# Patient Record
Sex: Male | Born: 1965 | Race: White | Hispanic: No | Marital: Single | Smoking: Current every day smoker
Health system: Southern US, Community
[De-identification: ages and names within clinical notes are randomized; demographics above are authoritative.]

## PROBLEM LIST (undated history)

## (undated) DIAGNOSIS — C61 Malignant neoplasm of prostate: Secondary | ICD-10-CM

## (undated) DIAGNOSIS — C7951 Secondary malignant neoplasm of bone: Secondary | ICD-10-CM

## (undated) DIAGNOSIS — F32A Depression, unspecified: Secondary | ICD-10-CM

## (undated) DIAGNOSIS — F1010 Alcohol abuse, uncomplicated: Secondary | ICD-10-CM

## (undated) DIAGNOSIS — B182 Chronic viral hepatitis C: Secondary | ICD-10-CM

## (undated) DIAGNOSIS — M109 Gout, unspecified: Secondary | ICD-10-CM

## (undated) DIAGNOSIS — F329 Major depressive disorder, single episode, unspecified: Secondary | ICD-10-CM

## (undated) DIAGNOSIS — F1410 Cocaine abuse, uncomplicated: Secondary | ICD-10-CM

## (undated) DIAGNOSIS — Z923 Personal history of irradiation: Secondary | ICD-10-CM

## (undated) HISTORY — DX: Major depressive disorder, single episode, unspecified: F32.9

## (undated) HISTORY — DX: Chronic viral hepatitis C: B18.2

## (undated) HISTORY — DX: Depression, unspecified: F32.A

## (undated) HISTORY — DX: Personal history of irradiation: Z92.3

## (undated) NOTE — *Deleted (*Deleted)
Unable to discharge, unable to get into residents and patient does not want to discharge this late.

---

## 1998-02-25 ENCOUNTER — Emergency Department (HOSPITAL_COMMUNITY): Admission: EM | Admit: 1998-02-25 | Discharge: 1998-02-25 | Payer: Self-pay | Admitting: Emergency Medicine

## 1998-03-12 ENCOUNTER — Emergency Department (HOSPITAL_COMMUNITY): Admission: EM | Admit: 1998-03-12 | Discharge: 1998-03-12 | Payer: Self-pay | Admitting: Emergency Medicine

## 1999-11-09 ENCOUNTER — Emergency Department (HOSPITAL_COMMUNITY): Admission: EM | Admit: 1999-11-09 | Discharge: 1999-11-09 | Payer: Self-pay | Admitting: Emergency Medicine

## 1999-11-10 ENCOUNTER — Encounter: Payer: Self-pay | Admitting: Emergency Medicine

## 2000-04-09 ENCOUNTER — Emergency Department (HOSPITAL_COMMUNITY): Admission: EM | Admit: 2000-04-09 | Discharge: 2000-04-09 | Payer: Self-pay | Admitting: Emergency Medicine

## 2000-04-09 ENCOUNTER — Encounter: Payer: Self-pay | Admitting: Emergency Medicine

## 2003-03-27 ENCOUNTER — Emergency Department (HOSPITAL_COMMUNITY): Admission: EM | Admit: 2003-03-27 | Discharge: 2003-03-27 | Payer: Self-pay | Admitting: Emergency Medicine

## 2003-03-30 ENCOUNTER — Emergency Department (HOSPITAL_COMMUNITY): Admission: AD | Admit: 2003-03-30 | Discharge: 2003-03-30 | Payer: Self-pay | Admitting: Family Medicine

## 2012-01-09 ENCOUNTER — Emergency Department (HOSPITAL_COMMUNITY)
Admission: EM | Admit: 2012-01-09 | Discharge: 2012-01-11 | Disposition: A | Discharge status: Home / Self Care | Payer: Self-pay | Attending: Emergency Medicine | Admitting: Emergency Medicine

## 2012-01-09 ENCOUNTER — Encounter (HOSPITAL_COMMUNITY): Payer: Self-pay | Admitting: *Deleted

## 2012-01-09 DIAGNOSIS — F1010 Alcohol abuse, uncomplicated: Secondary | ICD-10-CM

## 2012-01-09 DIAGNOSIS — F1910 Other psychoactive substance abuse, uncomplicated: Secondary | ICD-10-CM

## 2012-01-09 DIAGNOSIS — F1410 Cocaine abuse, uncomplicated: Secondary | ICD-10-CM | POA: Insufficient documentation

## 2012-01-09 HISTORY — DX: Cocaine abuse, uncomplicated: F14.10

## 2012-01-09 HISTORY — DX: Alcohol abuse, uncomplicated: F10.10

## 2012-01-09 LAB — RAPID URINE DRUG SCREEN, HOSP PERFORMED
Amphetamines: NOT DETECTED
Barbiturates: POSITIVE — AB
Benzodiazepines: NOT DETECTED
Cocaine: POSITIVE — AB
Opiates: NOT DETECTED
Tetrahydrocannabinol: NOT DETECTED

## 2012-01-09 LAB — COMPREHENSIVE METABOLIC PANEL
ALT: 71 U/L — ABNORMAL HIGH (ref 0–53)
AST: 73 U/L — ABNORMAL HIGH (ref 0–37)
Albumin: 4.1 g/dL (ref 3.5–5.2)
Alkaline Phosphatase: 60 U/L (ref 39–117)
BUN: 9 mg/dL (ref 6–23)
CO2: 18 mEq/L — ABNORMAL LOW (ref 19–32)
Calcium: 9.3 mg/dL (ref 8.4–10.5)
Chloride: 99 mEq/L (ref 96–112)
Creatinine, Ser: 0.75 mg/dL (ref 0.50–1.35)
GFR calc Af Amer: >90 mL/min (ref >90)
GFR calc non Af Amer: >90 mL/min (ref >90)
Glucose, Bld: 78 mg/dL (ref 70–99)
Potassium: 3.6 mEq/L (ref 3.5–5.1)
Sodium: 135 mEq/L (ref 135–145)
Total Bilirubin: 1.1 mg/dL (ref 0.3–1.2)
Total Protein: 7.8 g/dL (ref 6.0–8.3)

## 2012-01-09 LAB — CBC
HCT: 45.1 % (ref 39.0–52.0)
Hemoglobin: 16.8 g/dL (ref 13.0–17.0)
MCH: 32.7 pg (ref 26.0–34.0)
MCHC: 36.9 g/dL — ABNORMAL HIGH (ref 30.0–36.0)
MCV: 87.9 fL (ref 78.0–100.0)
Platelets: 182 10*3/uL (ref 150–400)
RBC: 5.13 MIL/uL (ref 4.22–5.81)
RDW: 12.9 % (ref 11.5–15.5)
WBC: 9.1 10*3/uL (ref 4.0–10.5)

## 2012-01-09 LAB — ETHANOL: Alcohol, Ethyl (B): 34 mg/dL — ABNORMAL HIGH (ref 0–11)

## 2012-01-09 MED ORDER — ACETAMINOPHEN 325 MG PO TABS
650.0000 mg | ORAL_TABLET | ORAL | Status: DC | PRN
  Administered 2012-01-09: 650 mg via ORAL
  Filled 2012-01-09: qty 2

## 2012-01-09 MED ORDER — THIAMINE HCL 100 MG/ML IJ SOLN: 100.0000 mg | Freq: Every day | INTRAMUSCULAR | Status: DC

## 2012-01-09 MED ORDER — ADULT MULTIVITAMIN W/MINERALS CH
1.0000 | ORAL_TABLET | Freq: Every day | ORAL | Status: DC
  Administered 2012-01-09 – 2012-01-11 (×3): 1 via ORAL
  Filled 2012-01-09 (×3): qty 1

## 2012-01-09 MED ORDER — ZOLPIDEM TARTRATE 5 MG PO TABS: 5.0000 mg | ORAL_TABLET | Freq: Every evening | ORAL | Status: DC | PRN

## 2012-01-09 MED ORDER — FOLIC ACID 1 MG PO TABS
1.0000 mg | ORAL_TABLET | Freq: Every day | ORAL | Status: DC
  Administered 2012-01-09 – 2012-01-11 (×3): 1 mg via ORAL
  Filled 2012-01-09 (×3): qty 1

## 2012-01-09 MED ORDER — VITAMIN B-1 100 MG PO TABS
100.0000 mg | ORAL_TABLET | Freq: Every day | ORAL | Status: DC
  Administered 2012-01-09 – 2012-01-11 (×3): 100 mg via ORAL
  Filled 2012-01-09 (×3): qty 1

## 2012-01-09 MED ORDER — LORAZEPAM 2 MG/ML IJ SOLN: 1.0000 mg | Freq: Four times a day (QID) | INTRAMUSCULAR | Status: DC | PRN

## 2012-01-09 MED ORDER — ALUM & MAG HYDROXIDE-SIMETH 200-200-20 MG/5ML PO SUSP: 30.0000 mL | ORAL | Status: DC | PRN

## 2012-01-09 MED ORDER — ONDANSETRON HCL 4 MG PO TABS: 4.0000 mg | ORAL_TABLET | Freq: Three times a day (TID) | ORAL | Status: DC | PRN

## 2012-01-09 MED ORDER — LORAZEPAM 1 MG PO TABS
1.0000 mg | ORAL_TABLET | Freq: Four times a day (QID) | ORAL | Status: DC | PRN
  Administered 2012-01-09: 1 mg via ORAL
  Filled 2012-01-09: qty 1

## 2012-01-09 MED ORDER — NICOTINE 21 MG/24HR TD PT24
21.0000 mg | MEDICATED_PATCH | Freq: Every day | TRANSDERMAL | Status: DC
  Administered 2012-01-09 – 2012-01-11 (×3): 21 mg via TRANSDERMAL
  Filled 2012-01-09 (×3): qty 1

## 2012-01-09 MED ORDER — IBUPROFEN 600 MG PO TABS
600.0000 mg | ORAL_TABLET | Freq: Three times a day (TID) | ORAL | Status: DC | PRN
  Administered 2012-01-09 – 2012-01-10 (×2): 600 mg via ORAL
  Filled 2012-01-09 (×2): qty 3

## 2012-01-09 NOTE — BH Assessment (Signed)
Assessment Note   Edgar Mooney is a 46 y.o. male who presents to [facility] voluntarily requesting detox from alcohol. Pt reports drinking an 18 pack of beer daily for the past 3 months. He also reports smoking various amounts of crack cocaine daily for the past 3 weeks. He reports a history of alcohol abuse. Pt states he was clean and sober from 2008 until 4 months ago when he was laid off from his employment. He states he then lost home due to not being able to afford payments. He states he stopped going to [employer] and started drinking. He reports seeking treatment at this time because he is "tired of doing what I've been doing" and "Its as good a time to stop as any." He states he is motivated for change and is planning on seeking long term treatment at [facility] after completing detox. He denies a history of DTs and Seizures. Current withdrawal symptoms include anxiety, feeling shaky, and feeling restless.   He denies SI, HI, and AHVH. He can contract for safety.     Axis I: Alcohol Dependence and Cocaine Abuse  Axis II: Deferred Axis III:  Past Medical History  Diagnosis Date  . Alcohol abuse   . Cocaine abuse    Axis IV: economic problems, housing problems, occupational problems and problems related to social environment Axis V: 51-60 moderate symptoms  Past Medical History:  Past Medical History  Diagnosis Date  . Alcohol abuse   . Cocaine abuse     History reviewed. No pertinent past surgical history.  Family History: History reviewed. No pertinent family history.  Social History:  reports that he has been smoking Cigarettes.  He does not have any smokeless tobacco history on file. He reports that he drinks alcohol. He reports that he uses illicit drugs (Cocaine and Marijuana).  Additional Social History:  Alcohol / Drug Use History of alcohol / drug use?: Yes Substance #1 Name of Substance 1: Alcohol 1 - Age of First Use: 17 1 - Amount (size/oz): 18 pack  1 - Frequency:  daily 1 - Duration: heavily on and off for years, daily for 3 months 1 - Last Use / Amount: 01/09/12 Substance #2 Name of Substance 2: crack cocaine 2 - Amount (size/oz): varries 2 - Frequency: daily 2 - Duration: 3 weeks 2 - Last Use / Amount: 01/09/12  CIWA: CIWA-Ar BP: 128/72 mmHg Pulse Rate: 77  Nausea and Vomiting: no nausea and no vomiting Tactile Disturbances: none Tremor: not visible, but can be felt fingertip to fingertip Auditory Disturbances: not present Paroxysmal Sweats: no sweat visible Visual Disturbances: not present Anxiety: two Headache, Fullness in Head: mild Agitation: normal activity Orientation and Clouding of Sensorium: oriented and can do serial additions CIWA-Ar Total: 5  COWS:    Allergies: No Known Allergies  Home Medications:  (Not in a hospital admission)  OB/GYN Status:  No LMP for male patient.  General Assessment Data Location of Assessment: [facility] Living Arrangements: Other (Comment) (homeless) Can pt return to current living arrangement?: Yes Admission Status: Voluntary Is patient capable of signing voluntary admission?: Yes Transfer from: Acute Hospital Referral Source: Self/Family/Friend  Education Status Is patient currently in school?: No  Risk to self Suicidal Ideation: No Suicidal Intent: No Is patient at risk for suicide?: No Suicidal Plan?: No Access to Means: No What has been your use of drugs/alcohol within the last 12 months?: alcohol and crack cocaine Previous Attempts/Gestures: No How many times?: 0  Other Self Harm  Risks: none Triggers for Past Attempts: None known Intentional Self Injurious Behavior: None Family Suicide History: No Recent stressful life event(s): Job Loss;Other (Comment) (lost his home) Persecutory voices/beliefs?: No Depression: Yes Depression Symptoms: Despondent;Loss of interest in usual pleasures;Feeling worthless/self pity Substance abuse history and/or treatment for substance abuse?:  No Suicide prevention information given to non-admitted patients: Not applicable  Risk to Others Homicidal Ideation: No Thoughts of Harm to Others: No Current Homicidal Intent: No Current Homicidal Plan: No Access to Homicidal Means: No Identified Victim: none History of harm to others?: No Assessment of Violence: None Noted Violent Behavior Description: cooperative Does patient have access to weapons?: No Criminal Charges Pending?: Yes Describe Pending Criminal Charges: DWI Does patient have a court date: Yes Court Date: 01/25/12  Psychosis Hallucinations: None noted Delusions: None noted  Mental Status Report Appear/Hygiene: Disheveled Eye Contact: Fair Motor Activity: Unremarkable Speech: Logical/coherent Level of Consciousness: Quiet/awake Mood: Depressed Affect: Depressed Anxiety Level: None Thought Processes: Coherent;Relevant Judgement: Impaired Orientation: Person;Place;Time;Situation Obsessive Compulsive Thoughts/Behaviors: None  Cognitive Functioning Concentration: Normal Memory: Recent Intact;Remote Intact IQ: Average Insight: Fair Impulse Control: Fair Appetite: Fair Weight Loss: 0  Weight Gain: 0  Sleep: No Change Vegetative Symptoms: None  ADLScreening [facility] Assessment Services) Patient's cognitive ability adequate to safely complete daily activities?: Yes Patient able to express need for assistance with ADLs?: Yes Independently performs ADLs?: Yes (appropriate for developmental age)  Abuse/Neglect [facility]) Physical Abuse: Denies Verbal Abuse: Denies Sexual Abuse: Denies  Prior Inpatient Therapy Prior Inpatient Therapy: Yes Prior Therapy Dates: 2008 Prior Therapy Facilty/Provider(s): [facility] Reason for Treatment: detox  Prior Outpatient Therapy Prior Outpatient Therapy: Yes Prior Therapy Dates: 2012 Prior Therapy Facilty/Provider(s): [facility] Reason for Treatment: SA  ADL Screening (condition at time of admission) Patient's  cognitive ability adequate to safely complete daily activities?: Yes Patient able to express need for assistance with ADLs?: Yes Independently performs ADLs?: Yes (appropriate for developmental age) Weakness of Legs: None Weakness of Arms/Hands: None  Home Assistive Devices/Equipment Home Assistive Devices/Equipment: None    Abuse/Neglect Assessment (Assessment to be complete while patient is alone) Physical Abuse: Denies Verbal Abuse: Denies Sexual Abuse: Denies Exploitation of patient/patient's resources: Denies Self-Neglect: Denies Values / Beliefs Cultural Requests During Hospitalization: None Spiritual Requests During Hospitalization: None   Advance Directives (For Healthcare) Advance Directive: Patient does not have advance directive;Patient would not like information Pre-existing out of facility DNR order (yellow form or pink MOST form): No Nutrition Screen- [facility] Adult/[facility] Patient's home diet: Regular Have you recently lost weight without trying?: No Have you been eating poorly because of a decreased appetite?: No Malnutrition Screening Tool Score: 0   Additional Information 1:1 In Past 12 Months?: No CIRT Risk: No Elopement Risk: No Does patient have medical clearance?: Yes     Disposition:  Disposition Disposition of Patient: Referred to;Inpatient treatment program Type of inpatient treatment program: Adult Patient referred to: [facility]  On Site Evaluation by:   Reviewed with Physician:     Georgina Quint A 01/09/2012 11:45 PM

## 2012-01-09 NOTE — ED Notes (Signed)
Pt changed into blue scrubs and wanded by security 

## 2012-01-09 NOTE — ED Provider Notes (Signed)
Medical screening examination/treatment/procedure(s) were performed by non-physician practitioner and as supervising physician I was immediately available for consultation/collaboration.   Celene Kras, MD 01/09/12 [phone]

## 2012-01-09 NOTE — ED Notes (Addendum)
Pt requesting detox from etoh and cocaine. Reports last use was 2 hrs ago. Reports usually drinks "all I can get my hands on" pt denies SI/HI.

## 2012-01-09 NOTE — ED Notes (Signed)
Pt has one belonging bag and is locked in locker #26 in TCU

## 2012-01-09 NOTE — ED Notes (Signed)
Pt reports anxiety and feeling "shaky on the inside", reports h/a. Tylenol and Ativan given. Will monitor. Pt calm and cooperative at present.

## 2012-01-09 NOTE — ED Provider Notes (Signed)
History     CSN: [id number]  Arrival date & time 01/09/12  1320   First MD Initiated Contact with Patient 01/09/12 1416      Chief Complaint  Patient presents with  . Medical Clearance    (Consider location/radiation/quality/duration/timing/severity/associated sxs/prior treatment) HPI  46 year old male with hx of alcohol abuse and cocaine abuse present requesting for detox.  Pt reports he has been stressing out, losing his job, home, and can't carry a steady relationship.  He is living in a shelter.  Has been abusing alcohol and cocaine.  Last use today.  He wants help, he is requesting for detox  Denies SI/HI, or hallucination.  Denies any other sxs except generalized body aches and unable to sleep for the past 2 days.    Past Medical History  Diagnosis Date  . Alcohol abuse   . Cocaine abuse     History reviewed. No pertinent past surgical history.  History reviewed. No pertinent family history.  History  Substance Use Topics  . Smoking status: Current Every Day Smoker    Types: Cigarettes  . Smokeless tobacco: Not on file  . Alcohol Use: Yes      Review of Systems  Constitutional: Negative for fever.  HENT: Negative for neck pain.   Respiratory: Negative for chest tightness and shortness of breath.   Cardiovascular: Negative for chest pain.  Gastrointestinal: Negative for abdominal pain.  Neurological: Negative for headaches.  All other systems reviewed and are negative.    Allergies  Review of patient's allergies indicates no known allergies.  Home Medications   Current Outpatient Rx  Name Route Sig Dispense Refill  . ASPIRIN-SALICYLAMIDE-CAFFEINE 650-195-33.3 MG PO PACK Oral Take 1 Package by mouth daily as needed. headache      BP 109/69  Pulse 72  Temp 98.7 F (37.1 C) (Oral)  Resp 18  Ht 5\' 5"  (1.651 m)  Wt 154 lb (69.854 kg)  BMI 25.63 kg/m2  SpO2 100%  Physical Exam  Nursing note and vitals reviewed. Constitutional: He appears  well-developed and well-nourished. No distress.       Awake, alert, nontoxic appearance  HENT:  Head: Atraumatic.  Eyes: Conjunctivae normal are normal. Right eye exhibits no discharge. Left eye exhibits no discharge.  Neck: Normal range of motion. Neck supple.  Cardiovascular: Normal rate and regular rhythm.   Pulmonary/Chest: Effort normal. No respiratory distress. He exhibits no tenderness.  Abdominal: Soft. There is no tenderness. There is no rebound.  Musculoskeletal: He exhibits no edema and no tenderness.       ROM appears intact, no obvious focal weakness  Neurological: He is alert.  Skin: Skin is warm and dry. No rash noted.  Psychiatric: He has a normal mood and affect.    ED Course  Procedures (including critical care time)  Labs Reviewed  CBC - Abnormal; Notable for the following:    MCHC 36.9 (*)     All other components within normal limits  COMPREHENSIVE METABOLIC PANEL - Abnormal; Notable for the following:    CO2 18 (*)     AST 73 (*)     ALT 71 (*)     All other components within normal limits  ETHANOL - Abnormal; Notable for the following:    Alcohol, Ethyl (B) 34 (*)     All other components within normal limits  URINE RAPID DRUG SCREEN (HOSP PERFORMED) - Abnormal; Notable for the following:    Cocaine POSITIVE (*)     Barbiturates  POSITIVE (*)     All other components within normal limits   Results for orders placed during the hospital encounter of 01/09/12  CBC      Component Value Range   WBC 9.1  4.0 - 10.5 K/uL   RBC 5.13  4.22 - 5.81 MIL/uL   Hemoglobin 16.8  13.0 - 17.0 g/dL   HCT 96.0  45.4 - 09.8 %   MCV 87.9  78.0 - 100.0 fL   MCH 32.7  26.0 - 34.0 pg   MCHC 36.9 (*) 30.0 - 36.0 g/dL   RDW 11.9  14.7 - 82.9 %   Platelets 182  150 - 400 K/uL  COMPREHENSIVE METABOLIC PANEL      Component Value Range   Sodium 135  135 - 145 mEq/L   Potassium 3.6  3.5 - 5.1 mEq/L   Chloride 99  96 - 112 mEq/L   CO2 18 (*) 19 - 32 mEq/L   Glucose, Bld 78   70 - 99 mg/dL   BUN 9  6 - 23 mg/dL   Creatinine, Ser 5.62  0.50 - 1.35 mg/dL   Calcium 9.3  8.4 - 13.0 mg/dL   Total Protein 7.8  6.0 - 8.3 g/dL   Albumin 4.1  3.5 - 5.2 g/dL   AST 73 (*) 0 - 37 U/L   ALT 71 (*) 0 - 53 U/L   Alkaline Phosphatase 60  39 - 117 U/L   Total Bilirubin 1.1  0.3 - 1.2 mg/dL   GFR calc non Af Amer >90  >90 mL/min   GFR calc Af Amer >90  >90 mL/min  ETHANOL      Component Value Range   Alcohol, Ethyl (B) 34 (*) 0 - 11 mg/dL  URINE RAPID DRUG SCREEN (HOSP PERFORMED)      Component Value Range   Opiates NONE DETECTED  NONE DETECTED   Cocaine POSITIVE (*) NONE DETECTED   Benzodiazepines NONE DETECTED  NONE DETECTED   Amphetamines NONE DETECTED  NONE DETECTED   Tetrahydrocannabinol NONE DETECTED  NONE DETECTED   Barbiturates POSITIVE (*) NONE DETECTED   No results found.   1. Cocaine abuse 2. Alcohol abuse  MDM  Pt requesting for alcohol and cocaine detox.  Does not appears to be in withdrawal.  NO SI/HI.  Discussed care with my attending.    BP 109/69  Pulse 72  Temp 98.7 F (37.1 C) (Oral)  Resp 18  Ht 5\' 5"  (1.651 m)  Wt 154 lb (69.854 kg)  BMI 25.63 kg/m2  SpO2 100%  Nursing notes reviewed and considered in documentation  Previous records reviewed and considered  All labs/vitals reviewed and considered   3:20 PM Consulted [facility] who will continue further care.  Psych hold and Med Rec filled.          Fayrene Helper, PA-C 01/09/12 1521

## 2012-01-09 NOTE — Progress Notes (Signed)
Alma Downs, MD is pcp updated epic

## 2012-01-11 MED ORDER — LORAZEPAM 1 MG PO TABS: 1.0000 mg | ORAL_TABLET | Freq: Four times a day (QID) | ORAL | Status: DC | PRN

## 2012-01-11 NOTE — BHH Counselor (Signed)
Spoke with RN who informed of pt contacting [facility] via telephone. Informed they do have a male bed. But will need authorization from the [facility]. Contacted [facility] and spoke with Noreene Larsson who stated pt is not a resident of [address] and therefore would need to contact [place] for auth for this pt. TC to [employer] and spoke with Sherrine Maples who informed me they do not have a contract with [facility] and would not provide funding for this. TC with Misty Stanley @ [facility]. Let her know dilemma. Stated she would have to get [facility] involved, as pt informed her that he was a [employer] resident. Transferred to [facility], explained situation. They do not have [place] beds today, but do have [place] beds. Has been there before but listed as [facility].

## 2012-01-11 NOTE — ED Notes (Signed)
Dr campos into see 

## 2012-01-11 NOTE — BHH Counselor (Signed)
Spoke with pt informing him about the sponsorship denials and issues regarding his residency. Pt stated he has been staying at the [place] homeless shelter for a few weeks, but has a [place] address listed on his ID. Questioned pt as to why he gave a [place] address upon admission and pt stated "I don't know, maybe I was confused and gave my son's address. He lives here in [place]. We both have the same name, I'm Edgar Mooney and he is Edgar Simas III." Explained that the [facility] is not willing to sponsor him due to being in [facility] for a few weeks, as [facility] does not have bed opening for [employer]. Reviewed residential treatment list with pt and instructed him to follow up with these services. Updated RN.

## 2012-01-11 NOTE — ED Provider Notes (Signed)
Filed Vitals:   01/11/12 0427  BP: 111/74  Pulse: 52  Temp: 97.5 F (36.4 C)  Resp: 20   8:15 AM  No HI or SI. Home with outpatient resources. Home with PRN ativan. Agreeable to plan   Lyanne Co, MD 01/11/12 [phone]

## 2012-01-11 NOTE — ED Notes (Addendum)
1 bag of belongings returned to pt after leaving the area

## 2012-01-11 NOTE — ED Notes (Signed)
Pt contacted [facility] and Misty Stanley reported that they do have a male bed, but the pt requires authorization--[facility] is aware and is trying to arrange

## 2012-01-11 NOTE — ED Notes (Addendum)
[facility] unable to arrange for acceptance to [facility], pt aware, OP resources given and reviewed w/ pt.

## 2012-01-11 NOTE — BHH Counselor (Signed)
Pt agreeable with pursuing treatment on his own.  Pt provided with OPT and Tx program listings.

## 2012-01-11 NOTE — ED Notes (Signed)
Pt has been in contact w/  Friend at [facility] and they have advised him they may have beds today, and to call back in about 15 mins.

## 2012-01-11 NOTE — ED Notes (Addendum)
Dc instructions and referals  reviewed w/ pt,  pt verbalized understanding.  Pt up to the desk to use the phone.  Pt alert/oriented x3, pleasant.  Pt reports he is wants to get into long term treatment for alcohol treatment.  NAD.

## 2012-02-13 ENCOUNTER — Emergency Department (HOSPITAL_BASED_OUTPATIENT_CLINIC_OR_DEPARTMENT_OTHER)
Admission: EM | Admit: 2012-02-13 | Discharge: 2012-02-13 | Disposition: A | Discharge status: Home / Self Care | Payer: Self-pay | Attending: Emergency Medicine | Admitting: Emergency Medicine

## 2012-02-13 ENCOUNTER — Encounter (HOSPITAL_BASED_OUTPATIENT_CLINIC_OR_DEPARTMENT_OTHER): Payer: Self-pay | Admitting: Emergency Medicine

## 2012-02-13 DIAGNOSIS — Z7982 Long term (current) use of aspirin: Secondary | ICD-10-CM | POA: Insufficient documentation

## 2012-02-13 DIAGNOSIS — F17200 Nicotine dependence, unspecified, uncomplicated: Secondary | ICD-10-CM | POA: Insufficient documentation

## 2012-02-13 DIAGNOSIS — F1010 Alcohol abuse, uncomplicated: Secondary | ICD-10-CM | POA: Insufficient documentation

## 2012-02-13 DIAGNOSIS — M109 Gout, unspecified: Secondary | ICD-10-CM | POA: Insufficient documentation

## 2012-02-13 DIAGNOSIS — F1410 Cocaine abuse, uncomplicated: Secondary | ICD-10-CM | POA: Insufficient documentation

## 2012-02-13 MED ORDER — IBUPROFEN 800 MG PO TABS
800.0000 mg | ORAL_TABLET | Freq: Once | ORAL | Status: AC
  Administered 2012-02-13: 800 mg via ORAL
  Filled 2012-02-13: qty 1

## 2012-02-13 MED ORDER — COLCHICINE 0.6 MG PO TABS
1.2000 mg | ORAL_TABLET | Freq: Once | ORAL | Status: AC
  Administered 2012-02-13: 1.2 mg via ORAL
  Filled 2012-02-13: qty 2

## 2012-02-13 MED ORDER — COLCHICINE 0.6 MG PO TABS
0.6000 mg | ORAL_TABLET | Freq: Once | ORAL | Status: DC
  Filled 2012-02-13: qty 1

## 2012-02-13 MED ORDER — ALLOPURINOL 300 MG PO TABS: 300.0000 mg | ORAL_TABLET | Freq: Every day | ORAL | Status: DC

## 2012-02-13 MED ORDER — IBUPROFEN 800 MG PO TABS: 800.0000 mg | ORAL_TABLET | Freq: Three times a day (TID) | ORAL | Status: DC

## 2012-02-13 NOTE — ED Provider Notes (Signed)
History     CSN: [id number]  Arrival date & time 02/13/12  1317   First MD Initiated Contact with Patient 02/13/12 1330      Chief Complaint  Patient presents with  . Foot Pain    left    (Consider location/radiation/quality/duration/timing/severity/associated sxs/prior treatment) HPI Comments: Patient is a 46 year old male with a history of gout presents with 1 day history of left ankle pain. The patient denies injury to left ankle. He reports aching, severe pain. Patient reports constant pain since onset. He reports a history of gout and this joint location and pain is typical of his gout flares. Ankle movement and weight bearing activity make the pain worse. Nothing makes the pain better. Patient reports associated swelling and redness. Patient has not tried anything for pain relief. Patient denies obvious deformity, numbness/tingling, coolness/weakness of extremity, bruising, and any other injury.     Patient is a 46 y.o. male presenting with lower extremity pain.  Foot Pain Associated symptoms include arthralgias and joint swelling.    Past Medical History  Diagnosis Date  . Alcohol abuse   . Cocaine abuse     History reviewed. No pertinent past surgical history.  No family history on file.  History  Substance Use Topics  . Smoking status: Current Every Day Smoker -- 1.0 packs/day    Types: Cigarettes  . Smokeless tobacco: Not on file  . Alcohol Use: No      Review of Systems  Musculoskeletal: Positive for joint swelling and arthralgias.  All other systems reviewed and are negative.    Allergies  Review of patient's allergies indicates no known allergies.  Home Medications   Current Outpatient Rx  Name Route Sig Dispense Refill  . ASPIRIN-SALICYLAMIDE-CAFFEINE 650-195-33.3 MG PO PACK Oral Take 1 Package by mouth daily as needed. headache      BP 129/86  Pulse 85  Temp 97.9 F (36.6 C)  Resp 16  Ht 5\' 4"  (1.626 m)  Wt 170 lb (77.111 kg)  BMI  29.18 kg/m2  SpO2 99%  Physical Exam  Nursing note and vitals reviewed. Constitutional: He is oriented to person, place, and time. He appears well-developed and well-nourished. No distress.  HENT:  Head: Normocephalic and atraumatic.  Eyes: Conjunctivae normal and EOM are normal.  Neck: Normal range of motion. Neck supple.  Cardiovascular: Normal rate and regular rhythm.  Exam reveals no gallop and no friction rub.   No murmur heard. Pulmonary/Chest: Effort normal and breath sounds normal. He has no wheezes. He has no rales. He exhibits no tenderness.  Abdominal: Soft. He exhibits no distension.  Musculoskeletal: Normal range of motion.       Edema and erythema noted to left ankle. Left ankle ROM slightly limited due pain and swelling. Left ankle tender to palpation. Right ankle unaffected.   Neurological: He is alert and oriented to person, place, and time. Coordination normal.       Strength and sensation equal and intact bilaterally. Speech is goal-oriented. Moves limbs without ataxia.   Skin: Skin is warm and dry. He is not diaphoretic.  Psychiatric: He has a normal mood and affect. His behavior is normal.    ED Course  Procedures (including critical care time)  Labs Reviewed - No data to display No results found.   1. Gout flare       MDM  1:42 PM Patient reports this pain typical of his gout flares. He will have 1.8mg  colchicine here and go home with  800mg  ibuprofen and allopurinol.        Emilia Beck, PA-C 02/13/12 1538

## 2012-02-13 NOTE — ED Notes (Signed)
Pt c/o Lt foot pain x 1 day. Pt denies injury. Pt states "my gout is flaring up again".

## 2012-02-14 NOTE — ED Provider Notes (Signed)
Medical screening examination/treatment/procedure(s) were performed by non-physician practitioner and as supervising physician I was immediately available for consultation/collaboration.  Eirik Schueler, MD 02/14/12 1619 

## 2012-02-21 ENCOUNTER — Emergency Department (HOSPITAL_BASED_OUTPATIENT_CLINIC_OR_DEPARTMENT_OTHER)
Admission: EM | Admit: 2012-02-21 | Discharge: 2012-02-21 | Disposition: A | Discharge status: Home / Self Care | Payer: Self-pay | Attending: Emergency Medicine | Admitting: Emergency Medicine

## 2012-02-21 ENCOUNTER — Encounter (HOSPITAL_BASED_OUTPATIENT_CLINIC_OR_DEPARTMENT_OTHER): Payer: Self-pay | Admitting: *Deleted

## 2012-02-21 DIAGNOSIS — Z79899 Other long term (current) drug therapy: Secondary | ICD-10-CM | POA: Insufficient documentation

## 2012-02-21 DIAGNOSIS — F17200 Nicotine dependence, unspecified, uncomplicated: Secondary | ICD-10-CM | POA: Insufficient documentation

## 2012-02-21 DIAGNOSIS — F1410 Cocaine abuse, uncomplicated: Secondary | ICD-10-CM | POA: Insufficient documentation

## 2012-02-21 DIAGNOSIS — M10061 Idiopathic gout, right knee: Secondary | ICD-10-CM

## 2012-02-21 DIAGNOSIS — M109 Gout, unspecified: Secondary | ICD-10-CM | POA: Insufficient documentation

## 2012-02-21 DIAGNOSIS — IMO0001 Reserved for inherently not codable concepts without codable children: Secondary | ICD-10-CM | POA: Insufficient documentation

## 2012-02-21 HISTORY — DX: Gout, unspecified: M10.9

## 2012-02-21 MED ORDER — PREDNISONE 10 MG PO TABS: ORAL_TABLET | ORAL | Status: DC

## 2012-02-21 NOTE — ED Provider Notes (Signed)
History     CSN: [id number]  Arrival date & time 02/21/12  1054   First MD Initiated Contact with Patient 02/21/12 1204      Chief Complaint  Patient presents with  . Knee Pain    right     (Consider location/radiation/quality/duration/timing/severity/associated sxs/prior treatment) Patient is a 46 y.o. male presenting with knee pain. The history is provided by the patient. No language interpreter was used.  Knee Pain This is a new problem. The current episode started in the past 7 days. The problem occurs constantly. The problem has been gradually worsening. Associated symptoms include joint swelling and myalgias. The symptoms are aggravated by bending. He has tried nothing for the symptoms. The treatment provided moderate relief.  Pt complains of swelling in his right knee.  Pt reports he feels like he has gout.   Past Medical History  Diagnosis Date  . Alcohol abuse   . Cocaine abuse   . Gout     History reviewed. No pertinent past surgical history.  No family history on file.  History  Substance Use Topics  . Smoking status: Current Every Day Smoker -- 1.0 packs/day    Types: Cigarettes  . Smokeless tobacco: Not on file  . Alcohol Use: No     Comment: [facility] 01/24/12      Review of Systems  Musculoskeletal: Positive for myalgias and joint swelling.  All other systems reviewed and are negative.    Allergies  Review of patient's allergies indicates no known allergies.  Home Medications   Current Outpatient Rx  Name  Route  Sig  Dispense  Refill  . ALLOPURINOL 300 MG PO TABS   Oral   Take 1 tablet (300 mg total) by mouth daily.   30 tablet   3   . ASPIRIN-SALICYLAMIDE-CAFFEINE 650-195-33.3 MG PO PACK   Oral   Take 1 Package by mouth daily as needed. headache         . IBUPROFEN 800 MG PO TABS   Oral   Take 1 tablet (800 mg total) by mouth 3 (three) times daily.   21 tablet   0     BP 129/87  Pulse 67  Temp 98.4 F (36.9 C) (Oral)   Resp 20  SpO2 100%  Physical Exam  Vitals reviewed. Constitutional: He is oriented to person, place, and time. He appears well-developed and well-nourished.  HENT:  Head: Normocephalic.  Musculoskeletal: He exhibits tenderness.       From no instability, nv and ns intact  Neurological: He is alert and oriented to person, place, and time. He has normal reflexes.  Skin: Skin is warm.  Psychiatric: He has a normal mood and affect.    ED Course  Procedures (including critical care time)  Labs Reviewed - No data to display No results found.   No diagnosis found.    MDM  Prednisone taper        Elson Areas, [place] 02/21/12 1226

## 2012-02-21 NOTE — ED Provider Notes (Signed)
Medical screening examination/treatment/procedure(s) were performed by non-physician practitioner and as supervising physician I was immediately available for consultation/collaboration.   Dione Booze, MD 02/21/12 1259

## 2012-04-25 ENCOUNTER — Emergency Department (HOSPITAL_BASED_OUTPATIENT_CLINIC_OR_DEPARTMENT_OTHER): Payer: Self-pay

## 2012-04-25 ENCOUNTER — Emergency Department (HOSPITAL_BASED_OUTPATIENT_CLINIC_OR_DEPARTMENT_OTHER)
Admission: EM | Admit: 2012-04-25 | Discharge: 2012-04-25 | Disposition: A | Discharge status: Home / Self Care | Payer: Self-pay | Attending: Emergency Medicine | Admitting: Emergency Medicine

## 2012-04-25 ENCOUNTER — Encounter (HOSPITAL_BASED_OUTPATIENT_CLINIC_OR_DEPARTMENT_OTHER): Payer: Self-pay

## 2012-04-25 DIAGNOSIS — R059 Cough, unspecified: Secondary | ICD-10-CM | POA: Insufficient documentation

## 2012-04-25 DIAGNOSIS — F17200 Nicotine dependence, unspecified, uncomplicated: Secondary | ICD-10-CM | POA: Insufficient documentation

## 2012-04-25 DIAGNOSIS — R51 Headache: Secondary | ICD-10-CM | POA: Insufficient documentation

## 2012-04-25 DIAGNOSIS — J069 Acute upper respiratory infection, unspecified: Secondary | ICD-10-CM | POA: Insufficient documentation

## 2012-04-25 DIAGNOSIS — Z79899 Other long term (current) drug therapy: Secondary | ICD-10-CM | POA: Insufficient documentation

## 2012-04-25 DIAGNOSIS — R05 Cough: Secondary | ICD-10-CM | POA: Insufficient documentation

## 2012-04-25 DIAGNOSIS — M109 Gout, unspecified: Secondary | ICD-10-CM | POA: Insufficient documentation

## 2012-04-25 IMAGING — CR DG CHEST 2V
2 series · 2 of 2 positions shown · non-contrast
Comparison: None.

CLINICAL DATA: Nasal congestion, headache

CHEST - 2 VIEW

[w chest pa]
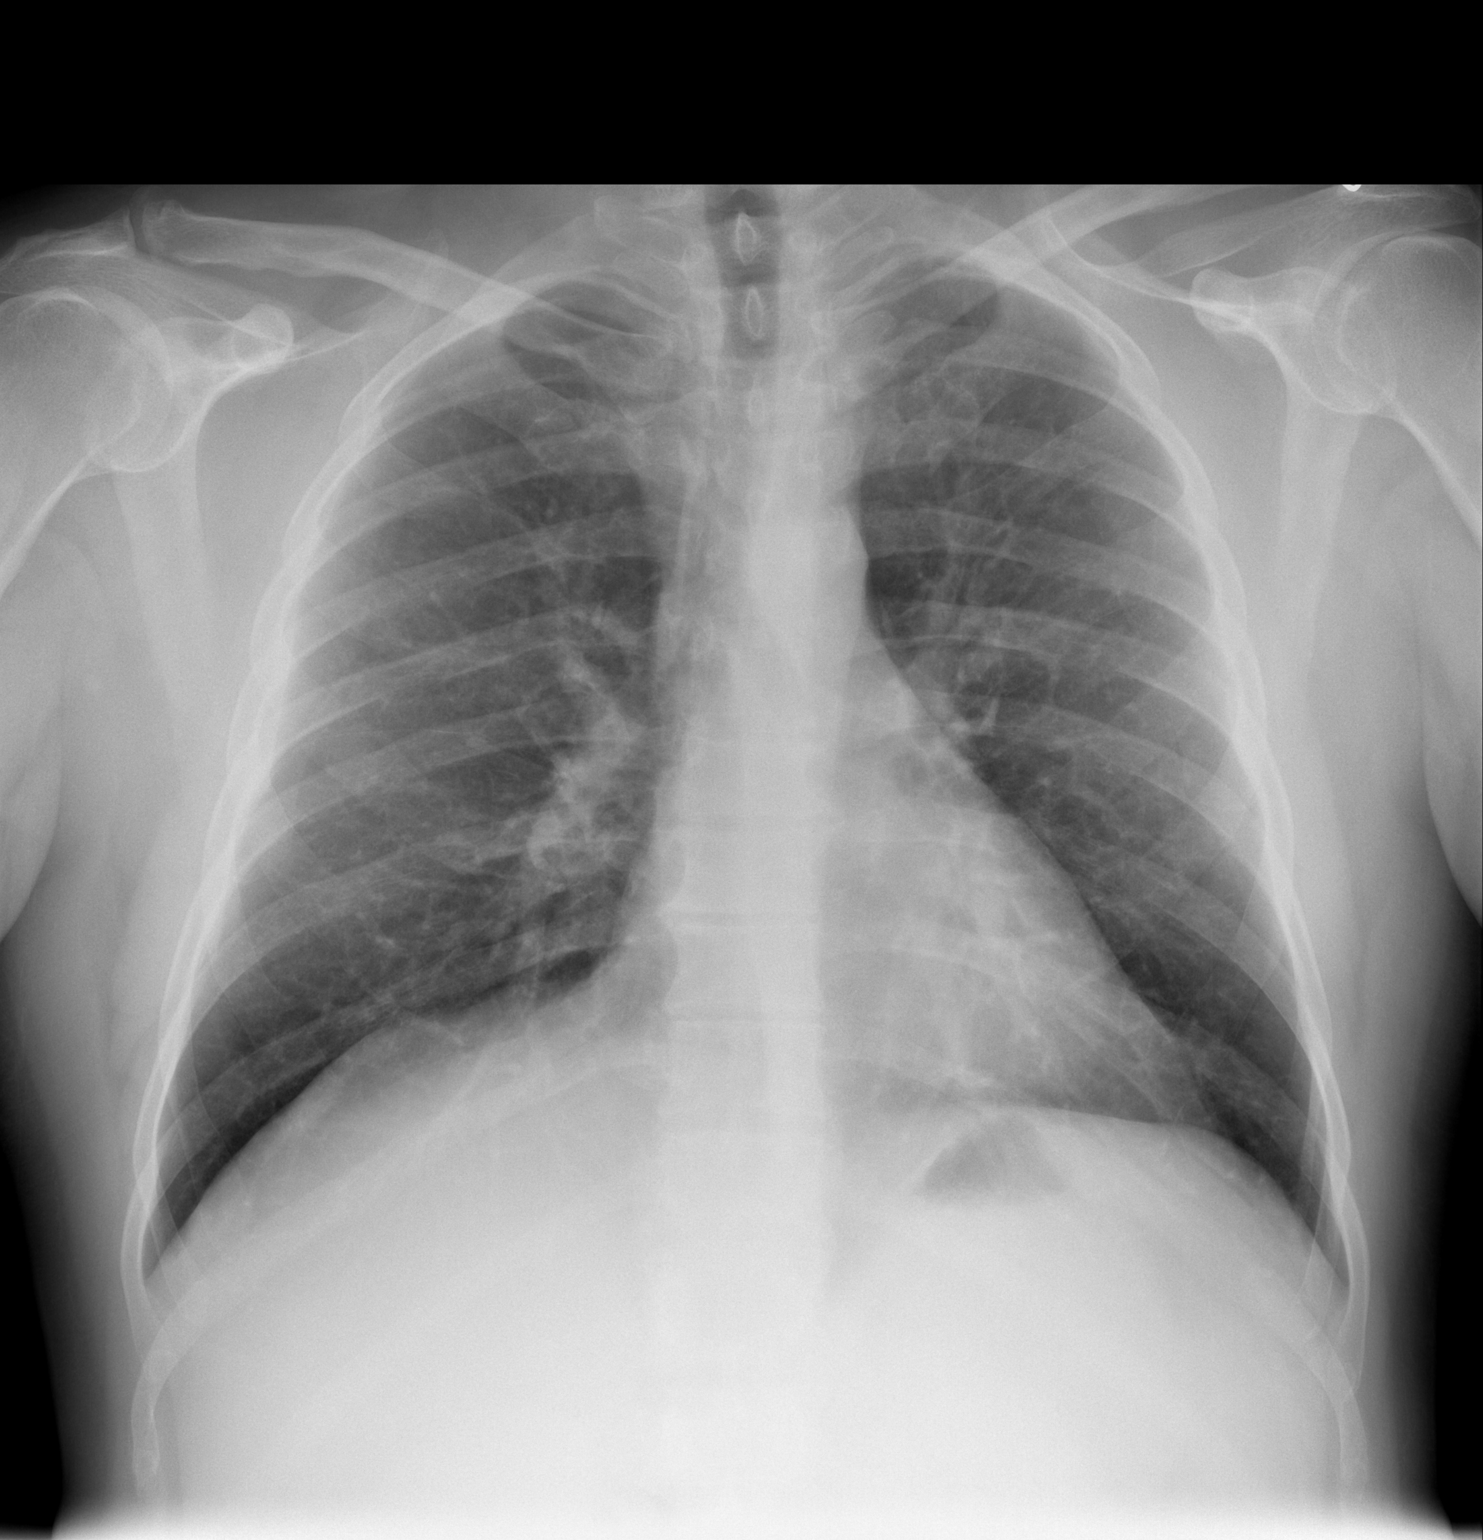

[w chest lat]
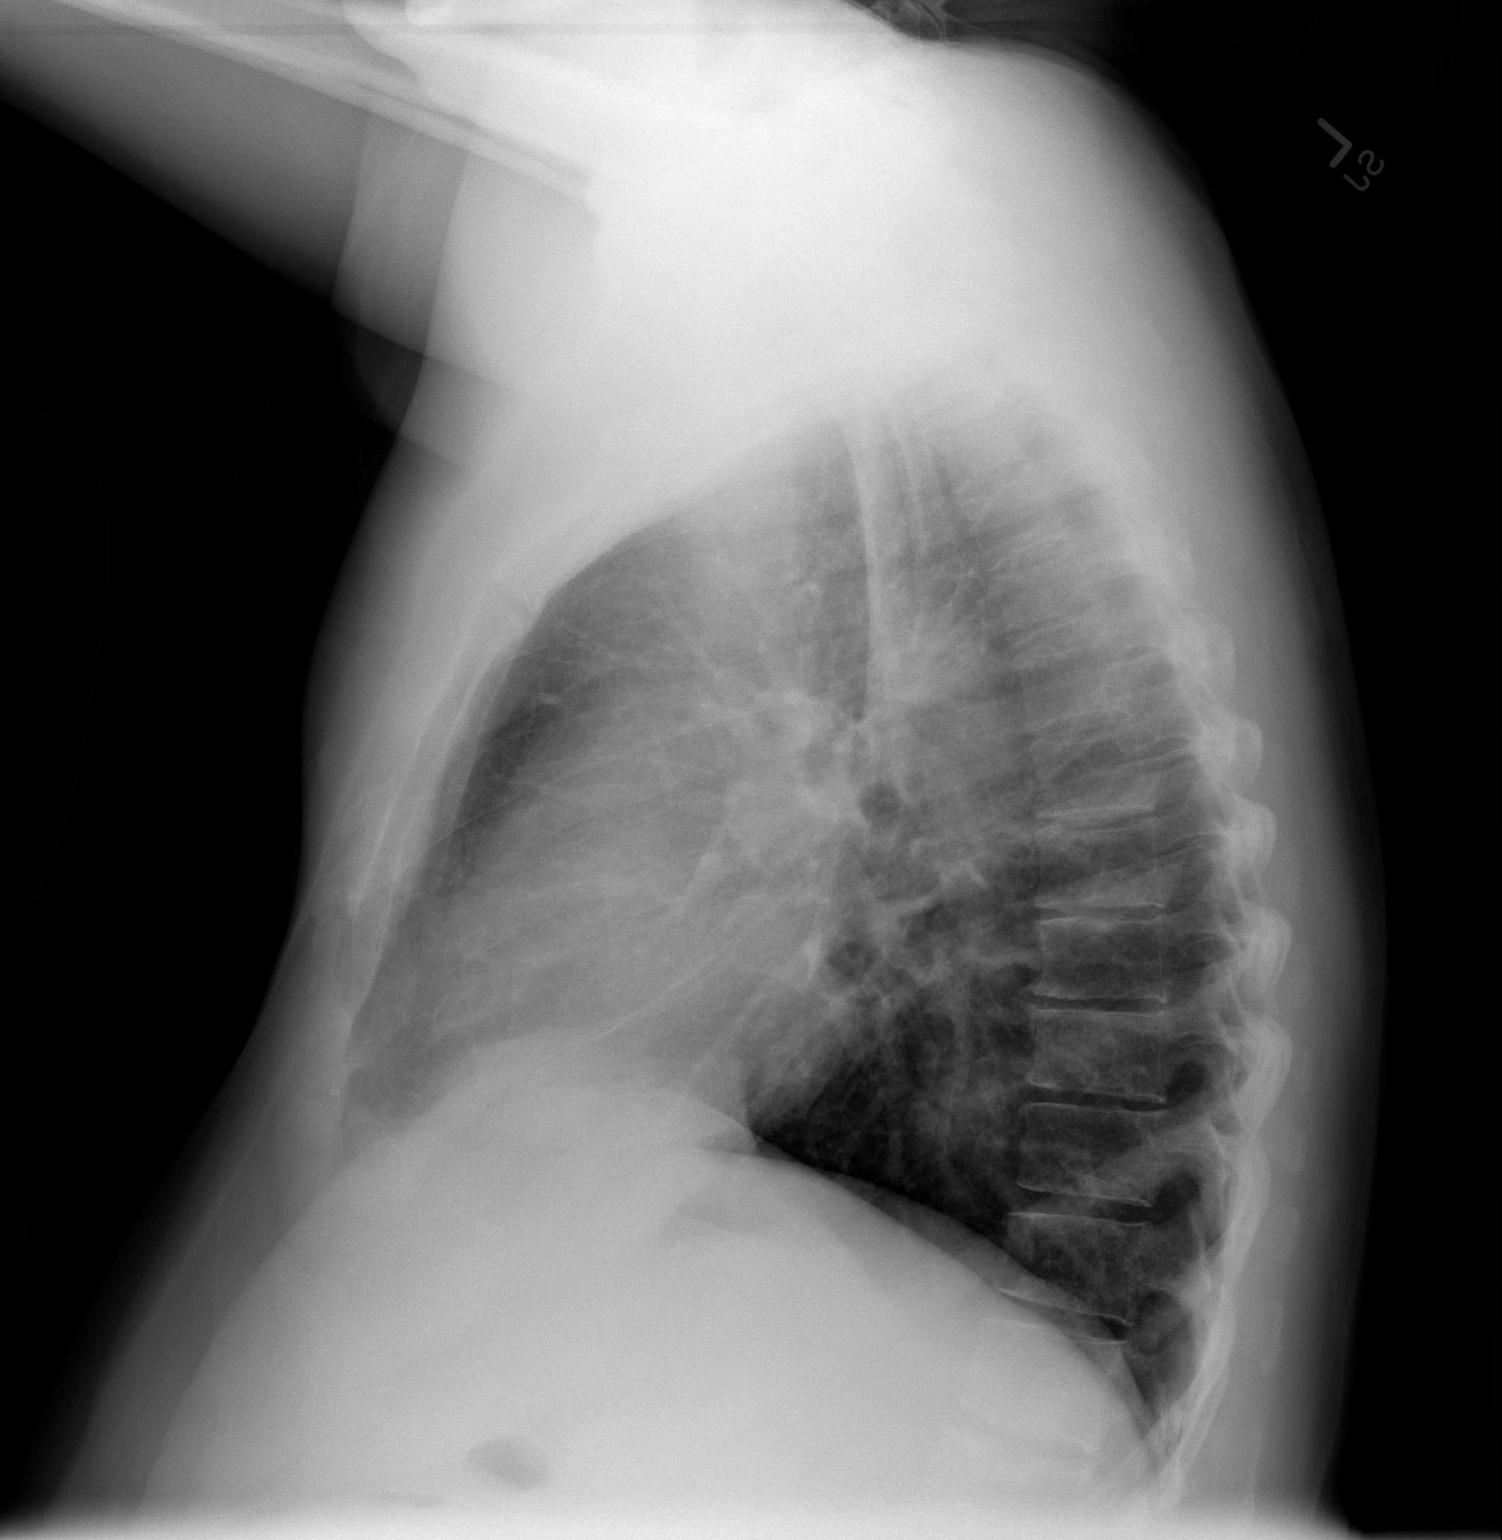

[2 of 2 positions shown; findings below may reference images not displayed]

FINDINGS: Cardiomediastinal silhouette is unremarkable.  No acute
infiltrate or pleural effusion.  No pulmonary edema. Mild perihilar
increased bronchial markings suspicious for mild bronchitic
changes.
IMPRESSION: No acute infiltrate or pulmonary edema.  Mild perihilar increased
bronchial markings suspicious for mild bronchitic changes.

## 2012-04-25 MED ORDER — OXYMETAZOLINE HCL 0.05 % NA SOLN
1.0000 | Freq: Three times a day (TID) | NASAL | Status: DC | PRN
  Administered 2012-04-25: 1 via NASAL
  Filled 2012-04-25: qty 15

## 2012-04-25 NOTE — ED Notes (Signed)
Pt states that he had onset of cough, congestion, headache yesterday.  [facility].  Pt has been sober for 3 months.  Cough is productive for yellow mucus, denies rhinorrhea, c/o congestion, and severe headache.

## 2012-04-25 NOTE — ED Provider Notes (Signed)
History     CSN: [id number]  Arrival date & time 04/25/12  1016   First MD Initiated Contact with Patient 04/25/12 1053      Chief Complaint  Patient presents with  . Nasal Congestion  . Cough  . Headache    (Consider location/radiation/quality/duration/timing/severity/associated sxs/prior treatment) Patient is a 47 y.o. male presenting with cough and headaches. The history is provided by the patient.  Cough This is a new problem. Episode onset: 3 days ago. The problem occurs constantly. The problem has been gradually worsening. The cough is productive of sputum. There has been no fever. Associated symptoms include ear congestion, headaches and rhinorrhea. Pertinent negatives include no myalgias, no shortness of breath and no wheezing. He has tried decongestants for the symptoms. The treatment provided no relief. He is a smoker. His past medical history does not include bronchitis, COPD or asthma.  Headache  Pertinent negatives include no fever and no shortness of breath.    Past Medical History  Diagnosis Date  . Alcohol abuse   . Cocaine abuse   . Gout     History reviewed. No pertinent past surgical history.  History reviewed. No pertinent family history.  History  Substance Use Topics  . Smoking status: Current Some Day Smoker    Types: Cigarettes  . Smokeless tobacco: Never Used  . Alcohol Use: No     Comment: Soberity 01/24/12      Review of Systems  Constitutional: Negative for fever.  HENT: Positive for rhinorrhea.   Respiratory: Positive for cough. Negative for shortness of breath and wheezing.   Musculoskeletal: Negative for myalgias.  Neurological: Positive for headaches.  All other systems reviewed and are negative.    Allergies  Review of patient's allergies indicates no known allergies.  Home Medications   Current Outpatient Rx  Name  Route  Sig  Dispense  Refill  . ALLOPURINOL 300 MG PO TABS   Oral   Take 1 tablet (300 mg total) by mouth  daily.   30 tablet   3   . BUPROPION HCL ER (SR) 150 MG PO TB12   Oral   Take 300 mg by mouth daily.         . IBUPROFEN 800 MG PO TABS   Oral   Take 1 tablet (800 mg total) by mouth 3 (three) times daily.   21 tablet   0   . ASPIRIN-SALICYLAMIDE-CAFFEINE 650-195-33.3 MG PO PACK   Oral   Take 1 Package by mouth daily as needed. headache         . PREDNISONE 10 MG PO TABS      6,5,4,3,2,1 taper   21 tablet   0     BP 121/73  Pulse 83  Temp 98.9 F (37.2 C) (Oral)  Resp 22  Ht 5\' 2"  (1.575 m)  Wt 180 lb (81.647 kg)  BMI 32.92 kg/m2  SpO2 98%  Physical Exam  Nursing note and vitals reviewed. Constitutional: He is oriented to person, place, and time. He appears well-developed and well-nourished. No distress.  HENT:  Head: Normocephalic and atraumatic.  Right Ear: Tympanic membrane normal.  Left Ear: Tympanic membrane normal.  Nose: Rhinorrhea present.  Mouth/Throat: No oropharyngeal exudate, posterior oropharyngeal edema or posterior oropharyngeal erythema.  Eyes: Conjunctivae normal and EOM are normal. Pupils are equal, round, and reactive to light.  Neck: Normal range of motion. Neck supple.  Cardiovascular: Normal rate, regular rhythm and intact distal pulses.   No murmur heard. Pulmonary/Chest: Effort  normal and breath sounds normal. No respiratory distress. He has no wheezes. He has no rales.  Abdominal: Soft. He exhibits no distension. There is no tenderness. There is no rebound and no guarding.  Musculoskeletal: Normal range of motion. He exhibits no edema and no tenderness.  Neurological: He is alert and oriented to person, place, and time.  Skin: Skin is warm and dry. No rash noted. No erythema.  Psychiatric: He has a normal mood and affect. His behavior is normal.    ED Course  Procedures (including critical care time)  Labs Reviewed - No data to display Dg Chest 2 View  04/25/2012  *RADIOLOGY REPORT*  Clinical Data: Nasal congestion, headache   CHEST - 2 VIEW  Comparison: None.  Findings: Cardiomediastinal silhouette is unremarkable.  No acute infiltrate or pleural effusion.  No pulmonary edema. Mild perihilar increased bronchial markings suspicious for mild bronchitic changes.  IMPRESSION: No acute infiltrate or pulmonary edema.  Mild perihilar increased bronchial markings suspicious for mild bronchitic changes.   Original Report Authenticated By: Natasha Mead, M.D.      1. URI (upper respiratory infection)       MDM   Pt with symptoms consistent with viral URI.  Well appearing here.  No signs of breathing difficulty  No signs of pharyngitis, otitis or abnormal abdominal findings.   CXR wnl and pt to return with any further problems.         Gwyneth Sprout, MD 04/25/12 1153

## 2014-09-07 ENCOUNTER — Emergency Department (HOSPITAL_BASED_OUTPATIENT_CLINIC_OR_DEPARTMENT_OTHER)
Admission: EM | Admit: 2014-09-07 | Discharge: 2014-09-07 | Disposition: A | Discharge status: Home / Self Care | Payer: Self-pay | Attending: Emergency Medicine | Admitting: Emergency Medicine

## 2014-09-07 ENCOUNTER — Encounter (HOSPITAL_BASED_OUTPATIENT_CLINIC_OR_DEPARTMENT_OTHER): Payer: Self-pay | Admitting: *Deleted

## 2014-09-07 DIAGNOSIS — D294 Benign neoplasm of scrotum: Secondary | ICD-10-CM | POA: Insufficient documentation

## 2014-09-07 DIAGNOSIS — Z720 Tobacco use: Secondary | ICD-10-CM | POA: Insufficient documentation

## 2014-09-07 DIAGNOSIS — D224 Melanocytic nevi of scalp and neck: Secondary | ICD-10-CM | POA: Insufficient documentation

## 2014-09-07 DIAGNOSIS — D225 Melanocytic nevi of trunk: Secondary | ICD-10-CM | POA: Insufficient documentation

## 2014-09-07 DIAGNOSIS — Z79899 Other long term (current) drug therapy: Secondary | ICD-10-CM | POA: Insufficient documentation

## 2014-09-07 DIAGNOSIS — Z791 Long term (current) use of non-steroidal anti-inflammatories (NSAID): Secondary | ICD-10-CM | POA: Insufficient documentation

## 2014-09-07 DIAGNOSIS — M109 Gout, unspecified: Secondary | ICD-10-CM | POA: Insufficient documentation

## 2014-09-07 DIAGNOSIS — D229 Melanocytic nevi, unspecified: Secondary | ICD-10-CM

## 2014-09-07 NOTE — ED Notes (Signed)
States he has a mole on his testicle he wants removed. He is coming from [facility].

## 2014-09-07 NOTE — Discharge Instructions (Signed)
Several concerning moles that will require treatment by dermatology. Would recommend a day Elta Guadeloupe get you into see a dermatologist. Excision of these moles cannot be done here. They will need to be sent to pathology. Resource guide provided below.    Emergency Department Resource Guide 1) Find a Doctor and Pay Out of Pocket Although you won't have to find out who is covered by your insurance plan, it is a good idea to ask around and get recommendations. You will then need to call the office and see if the doctor you have chosen will accept you as a new patient and what types of options they offer for patients who are self-pay. Some doctors offer discounts or will set up payment plans for their patients who do not have insurance, but you will need to ask so you aren't surprised when you get to your appointment.  2) Contact Your Local Health Department Not all health departments have doctors that can see patients for sick visits, but many do, so it is worth a call to see if yours does. If you don't know where your local health department is, you can check in your phone book. The CDC also has a tool to help you locate your state's health department, and many state websites also have listings of all of their local health departments.  3) Find a [facility] If your illness is not likely to be very severe or complicated, you may want to try a walk in clinic. These are popping up all over the country in pharmacies, drugstores, and shopping centers. They're usually staffed by nurse practitioners or physician assistants that have been trained to treat common illnesses and complaints. They're usually fairly quick and inexpensive. However, if you have serious medical issues or chronic medical problems, these are probably not your best option.  No Primary Care Doctor: - Call Health Connect at  [phone] - they can help you locate a primary care doctor that  accepts your insurance, provides certain services,  etc. - Physician Referral Service- [phone]  Chronic Pain Problems: Organization         Address  Phone   Notes  [facility]  [phone] Patients need to be referred by their primary care doctor.   Medication Assistance: Organization         Address  Phone   Notes  [facility] Medication [facility] [address] [place], [place] [address] [phone] --Must be a resident of [facility] -- Must have NO insurance coverage whatsoever (no Medicaid/ Medicare, etc.) -- The pt. MUST have a primary care doctor that directs their care regularly and follows them in the community   MedAssist  [phone]   [employer]  [phone]    Agencies that provide inexpensive medical care: Organization         Address  Phone   Notes  [place]  [phone]   [facility]    [phone]   [facility] [place], [place] [address] [phone]   [place] [address], [place] [phone]   Planned Parenthood    [phone]   [facility]    [phone]   [place] and [place] [address],  Phone:  [phone], Fax:  [phone] Hours of Operation:  9 am - 6 pm, M-F.  Also accepts Medicaid/Medicare  and self-pay.  [facility] for [place] [address], [address], [place] Phone: [phone], Fax: [phone]. Hours of Operation:  8:30 am - 5:30 pm, M-F.  Also accepts Medicaid and self-pay.  [facility] [place] [address], [place] Phone: [phone]   [place], [place], [place] [phone], Ext. [phone] Mondays & Thursdays: 7-9 AM.  First 15 patients are seen on a first come, first serve basis.    [place] Providers:  Organization         Address  Phone   Notes  [facility] [address], [place] [phone] Also accepts self-pay patients.  [facility] [address], [place]  [phone]   [place], [address], [place] [phone]   [facility] [address] [place] [phone]   Lucianne Lei [address], [place]   [phone] Only accepts [place] Access [place] patients after they have their name applied to their card.   Self-Pay (no insurance) in [facility]:  Organization         Address  Phone   Notes  Sickle Cell Patients, [facility] [place] [phone]   [facility] [place] [phone]   [facility] [place]  [place], [place], [place] [phone]   [facility]/Dr. Osei-Bonsu  [address] [place] or [address], [place] [phone] Phone number for both [place] and [place] locations is the same.  [facility] [address] [place] [phone]   [facility] [address] [place] or [address] [phone]   [facility] [place] [address] [place] [phone], phone; [phone], fax Sees patients 1st and 3rd Saturday of every month.  Must not qualify for public or private insurance (i.e. Medicaid, Medicare, [place] Health Choice, Veterans' Benefits)  Household income should be no more than 200% of the poverty level The clinic cannot treat you if you are pregnant or think you are pregnant  Sexually transmitted diseases are not treated at the clinic.    Dental Care: Organization         Address  Phone  Notes  [facility] Department of [facility] [phone] Accepts children up to  age 26 who are enrolled in [place] or [place]; pregnant women with a Medicaid card; and children who have applied for Medicaid or [place] Health Choice, but were declined, whose parents can pay a reduced fee at time of service.  [facility] Department of [facility]  [address], [place] [phone] Accepts children up to age 43 who are enrolled in [place] or [place]; pregnant women with a Medicaid card; and children who have applied for Medicaid or [place] Health Choice, but were declined, whose parents can pay a reduced fee at time of service.  [facility] Access PRO[facility]  [place] [phone] Patients are seen by appointment only. Walk-ins are not accepted. [place] will see patients 55 years of age and older. Monday - Tuesday (8am-5pm) Most Wednesdays (  8:30-5pm) $30 per visit, cash only  [facility] Adult Dental Access PROGRAM  [address], [facility] [phone] Patients are seen by appointment only. Walk-ins are not accepted. [place] will see patients 72 years of age and older. One Wednesday Evening (Monthly: Volunteer Based).  $30 per visit, cash only  [place]  [phone] for adults; Children under age 45, call Graduate Pediatric Dentistry at [phone]. Children aged 7-14, please call [phone] to request a pediatric application.  Dental services are provided in all areas of dental care including fillings, crowns and bridges, complete and partial dentures, implants, gum treatment, root canals, and extractions. Preventive care is also provided. Treatment is provided to both adults and children. Patients are selected via a lottery and there is often a waiting list.   [facility] [address], [place]  [phone] [email or web address]   [facility] [address] [place], [place] [phone], Ext. [phone] Second and Fourth Thursday of  each month, opens at 6:30 AM; Clinic ends at 9 AM.  Patients are seen on a first-come first-served basis, and a limited number are seen during each clinic.   [facility]  [address] Hillard Danker [place], [place] [phone]   Eligibility Requirements You must have lived in [place], [place], or [place] counties for at least the last three months.   You cannot be eligible for state or federal sponsored [employer], including [employer], [place], or [employer].   You generally cannot be eligible for healthcare insurance through your employer.    How to apply: Eligibility screenings are held every Tuesday and Wednesday afternoon from 1:00 pm until 4:00 pm. You do not need an appointment for the interview!  [facility] [address], [place], [place]   [place]  [place] Department  [place]  [phone]    Behavioral Health Resources in the Community: Intensive Outpatient Programs Organization         Address  Phone  Notes  [place]. [address], [place], [place] [phone]   [facility] Outpatient [address] [place], [place]   ADS: [facility] Drug S[facility] [address] [place], [place]   [place] [address],  [place], [place] or [phone]   Substance Abuse Resources Organization         Address  Phone  Notes  Alcohol and Drug Services  [phone]   [place]  [phone]   [facility] [place]   Chinita Pester  [phone]   Residential & Outpatient Substance Abuse Program  [phone]   Psychological Services Organization         Address  Phone  Notes  [facility] [place]  [place]  [phone]   [place] [address],  [place] or [phone]    Mobile Crisis Teams Organization         Address  Phone  Notes  [facility], Mobile Crisis Care Unit  [phone]   [facility]ive Psychotherapeutic Services  [address] [place], [place]   [place] Levels [address] [place] [phone]    Self-Help/Support Groups Organization         Address  Phone  Notes  Mental Health Assoc. of [place] - variety of support groups  [place] Call for more information  Narcotics Anonymous (NA), [facility] [address], [employer] [place]  2 meetings at this location   Special educational needs teacher         Address  Phone  Notes  ASAP Residential Treatment [place],    [place]  [phone]   [facility]  [address], [place] [phone], [place], [place]   [place], [place] [phone] Admissions: 8am-3pm M-F  Incentives Substance [place] [address],    [place], [place] [phone]   [facility] [address] [place], [place], [place]   [facility] [address]  [place], [place]   [facility] - Intensive Outpatient [address], [place], [place]   [facility] ([place].) [place].,  [place], [place] [phone] or [phone]   [facility] Services (RTS) [address] [place], [place] Accepts Medicaid  [facility] [place] [address],  [place] [phone] Substance Abuse/Addiction Treatment   [facility] Organization         Address  Phone  Notes  [facility]  [phone]   Domenic Schwab, PhD [address] Arlis Porta [place], [place]   [phone] or [phone]   [place], [place] [phone]     [facility] [address], [place], [place] [phone] Insurance/Medicaid/sponsorship through [facility] and Families [address], [address]                                    [place], [place] [phone] [place] [address], [place] [phone]    Dr. Adele Schilder  [phone]   [facility] Dept. 1) [address], [place] 2) [place] 3)  [address], [place] [phone]  [phone]   [place] [phone] or [phone] (After Hours)

## 2014-09-07 NOTE — ED Provider Notes (Signed)
CSN: [id number]     Arrival date & time 09/07/14  1448 History   First MD Initiated Contact with Patient 09/07/14 1527     Chief Complaint  Patient presents with  . Rash     (Consider location/radiation/quality/duration/timing/severity/associated sxs/prior Treatment) Patient is a 49 y.o. male presenting with rash. The history is provided by the patient.  Rash Associated symptoms: no fever, no headaches and no shortness of breath    [facility]. Patient with several moles are grossly has concerns about. The main one is in his right groin area that has been present for more than 10 years. It's been growing in size. He also has another one on the left scrotal area and he has another one on the fact that he is concerned about.  Past Medical History  Diagnosis Date  . Alcohol abuse   . Cocaine abuse   . Gout    History reviewed. No pertinent past surgical history. No family history on file. History  Substance Use Topics  . Smoking status: Current Some Day Smoker    Types: Cigarettes  . Smokeless tobacco: Never Used  . Alcohol Use: No     Comment: Soberity 01/24/12    Review of Systems  Constitutional: Negative for fever and unexpected weight change.  HENT: Negative for congestion.   Eyes: Negative for redness.  Respiratory: Negative for shortness of breath.   Cardiovascular: Negative for chest pain.  Genitourinary: Negative for dysuria, hematuria, discharge, scrotal swelling, genital sores, penile pain and testicular pain.  Musculoskeletal: Negative for back pain.  Skin: Positive for rash.  Neurological: Negative for headaches.  Hematological: Does not bruise/bleed easily.  Psychiatric/Behavioral: Negative for confusion.      Allergies  Review of patient's allergies indicates no known allergies.  Home Medications   Prior to Admission medications   Medication Sig Start Date End Date Taking? Authorizing Provider  FLUoxetine HCl (PROZAC PO) Take by  mouth.   Yes Historical Provider, MD  Meloxicam (MOBIC PO) Take by mouth.   Yes Historical Provider, MD  allopurinol (ZYLOPRIM) 300 MG tablet Take 1 tablet (300 mg total) by mouth daily. 02/13/12   Alvina Chou, PA-C  Aspirin-Salicylamide-Caffeine (BC FAST PAIN RELIEF) 650-195-33.3 MG PACK Take 1 Package by mouth daily as needed. headache    Historical Provider, MD  buPROPion (WELLBUTRIN SR) 150 MG 12 hr tablet Take 300 mg by mouth daily.    Historical Provider, MD  ibuprofen (ADVIL,MOTRIN) 800 MG tablet Take 1 tablet (800 mg total) by mouth 3 (three) times daily. 02/13/12   Kaitlyn Szekalski, PA-C  predniSONE (DELTASONE) 10 MG tablet 6,5,4,3,2,1 taper 02/21/12   Hollace Kinnier Sofia, PA-C   BP 155/92 mmHg  Pulse 65  Temp(Src) 98.6 F (37 C) (Oral)  Resp 18  Ht 5\' 2"  (1.575 m)  Wt 165 lb (74.844 kg)  BMI 30.17 kg/m2  SpO2 95% Physical Exam  Constitutional: He is oriented to person, place, and time. He appears well-developed and well-nourished. No distress.  HENT:  Head: Normocephalic and atraumatic.  Eyes: Conjunctivae and EOM are normal. Pupils are equal, round, and reactive to light.  Neck: Normal range of motion. Neck supple.  Abdominal: Soft. Bowel sounds are normal. There is no tenderness.  Musculoskeletal: Normal range of motion. He exhibits no edema.  Neurological: He is alert and oriented to person, place, and time. No cranial nerve deficit. He exhibits normal muscle tone. Coordination normal.  Skin: Skin is warm. No rash noted. No erythema.  No pallor.  Patient with several moles of concern. The main one is in the right groin area probably measures about 1 x 2.5 cm been present for many years. Not dark in color fleshy on a stalk. Not currently bleeding. No lymphadenopathy in that area. On the left area scrotal others a small one measuring about the 3 mm. On his back there is a another mole of concern on the left lower part of the back that measures about a centimeter. And then in the  mid back area between the shoulder blades there is an area about 5 mm at scaly. In addition there is a 2 moles measuring about 3 mm on a stalk up around the base of his left neck.  Nursing note and vitals reviewed.   ED Course  Procedures (including critical care time) Labs Review Labs Reviewed - No data to display  Imaging Review No results found.   EKG Interpretation None      MDM   Final diagnoses:  Numerous moles    Follow-up with dermatology most appropriate for excision of some of these moles. And also that so pathology could be sent. Recommend  day Elta Guadeloupe get some in to see a dermatologist.    Fredia Sorrow, MD 09/07/14 [phone]

## 2014-10-01 ENCOUNTER — Encounter (HOSPITAL_BASED_OUTPATIENT_CLINIC_OR_DEPARTMENT_OTHER): Payer: Self-pay | Admitting: *Deleted

## 2014-10-01 ENCOUNTER — Emergency Department (HOSPITAL_BASED_OUTPATIENT_CLINIC_OR_DEPARTMENT_OTHER)
Admission: EM | Admit: 2014-10-01 | Discharge: 2014-10-01 | Disposition: A | Discharge status: Home / Self Care | Payer: Self-pay | Attending: Emergency Medicine | Admitting: Emergency Medicine

## 2014-10-01 DIAGNOSIS — M109 Gout, unspecified: Secondary | ICD-10-CM | POA: Insufficient documentation

## 2014-10-01 DIAGNOSIS — Z791 Long term (current) use of non-steroidal anti-inflammatories (NSAID): Secondary | ICD-10-CM | POA: Insufficient documentation

## 2014-10-01 DIAGNOSIS — Z720 Tobacco use: Secondary | ICD-10-CM | POA: Insufficient documentation

## 2014-10-01 DIAGNOSIS — Z79899 Other long term (current) drug therapy: Secondary | ICD-10-CM | POA: Insufficient documentation

## 2014-10-01 DIAGNOSIS — I10 Essential (primary) hypertension: Secondary | ICD-10-CM | POA: Insufficient documentation

## 2014-10-01 MED ORDER — HYDROCHLOROTHIAZIDE 25 MG PO TABS: 25.0000 mg | ORAL_TABLET | Freq: Every day | ORAL | Status: DC

## 2014-10-01 NOTE — ED Notes (Signed)
MD at bedside. 

## 2014-10-01 NOTE — ED Provider Notes (Addendum)
CSN: [id number]     Arrival date & time 10/01/14  0911 History   First MD Initiated Contact with Patient 10/01/14 [phone]     Chief Complaint  Patient presents with  . Hypertension     (Consider location/radiation/quality/duration/timing/severity/associated sxs/prior Treatment) HPI Comments: Patient currently is at a rehabilitation facility and has a history of having elevated blood pressure but is not currently receiving treatment. Yesterday and today they checked his blood pressure and it was elevated. They sent him here for further care. He has had occasional dizziness and feeling lightheaded over the last few days but denies any unilateral weakness, speech difficulty, facial numbness or swallowing difficulty. No chest pain or shortness of breath. No recent medication changes. He has not been on any substances for over 60 days.  Patient is a 49 y.o. male presenting with hypertension. The history is provided by the patient.  Hypertension This is a chronic problem. Episode onset: unknown. The problem occurs constantly. The problem has not changed since onset.Pertinent negatives include no chest pain, no headaches and no shortness of breath. Nothing aggravates the symptoms. Nothing relieves the symptoms.    Past Medical History  Diagnosis Date  . Alcohol abuse   . Cocaine abuse   . Gout    History reviewed. No pertinent past surgical history. No family history on file. History  Substance Use Topics  . Smoking status: Current Some Day Smoker    Types: Cigarettes  . Smokeless tobacco: Never Used  . Alcohol Use: No     Comment: Soberity 01/24/12    Review of Systems  Respiratory: Negative for shortness of breath.   Cardiovascular: Negative for chest pain.  Neurological: Negative for headaches.  All other systems reviewed and are negative.     Allergies  Review of patient's allergies indicates no known allergies.  Home Medications   Prior to Admission medications    Medication Sig Start Date End Date Taking? Authorizing Provider  FLUoxetine HCl (PROZAC PO) Take by mouth.   Yes Historical Provider, MD  Meloxicam (MOBIC PO) Take by mouth.   Yes Historical Provider, MD  allopurinol (ZYLOPRIM) 300 MG tablet Take 1 tablet (300 mg total) by mouth daily. 02/13/12   Alvina Chou, PA-C  Aspirin-Salicylamide-Caffeine (BC FAST PAIN RELIEF) [id number] MG PACK Take 1 Package by mouth daily as needed. headache    Historical Provider, MD  buPROPion (WELLBUTRIN SR) 150 MG 12 hr tablet Take 300 mg by mouth daily.    Historical Provider, MD  ibuprofen (ADVIL,MOTRIN) 800 MG tablet Take 1 tablet (800 mg total) by mouth 3 (three) times daily. 02/13/12   Kaitlyn Szekalski, PA-C  predniSONE (DELTASONE) 10 MG tablet 6,5,4,3,2,1 taper 02/21/12   Hollace Kinnier Sofia, PA-C   BP 160/96 mmHg  Pulse 70  Temp(Src) 98.3 F (36.8 C) (Oral)  Resp 18  Ht 5\' 2"  (1.575 m)  Wt 177 lb (80.287 kg)  BMI 32.37 kg/m2  SpO2 99% Physical Exam  Constitutional: He is oriented to person, place, and time. He appears well-developed and well-nourished. No distress.  HENT:  Head: Normocephalic and atraumatic.  Mouth/Throat: Oropharynx is clear and moist.  Eyes: Conjunctivae and EOM are normal. Pupils are equal, round, and reactive to light.  Neck: Normal range of motion. Neck supple.  Cardiovascular: Normal rate, regular rhythm and intact distal pulses.   No murmur heard. Pulmonary/Chest: Effort normal and breath sounds normal. No respiratory distress. He has no wheezes. He has no rales.  Musculoskeletal: Normal range of motion. He exhibits  no edema or tenderness.  Neurological: He is alert and oriented to person, place, and time.  Skin: Skin is warm and dry. No rash noted. No erythema.  Psychiatric: He has a normal mood and affect. His behavior is normal.  Nursing note and vitals reviewed.   ED Course  Procedures (including critical care time) Labs Review Labs Reviewed - No data to  display  Imaging Review No results found.   EKG Interpretation None      MDM   Final diagnoses:  Essential hypertension    Patient here with persistent hypertension without other symptoms. Patient is currently at a rehabilitation facility and they check his blood pressure the last 2 days and it's been elevated. However patient has a history of having elevated blood pressure and was taking medications but is not currently. Here he is persistently had blood pressures in the 150s to 160s. Patient is otherwise well-appearing with no other acute findings. Will start patient on HCTZ and have a follow-up with her primary care physician.    Blanchie Dessert, MD 10/01/14 [place], MD 10/01/14 [phone]

## 2014-10-01 NOTE — ED Notes (Signed)
Pt is a resident of [facility]d they told him last night that his BP was high. They took it again this morning and it was still high so they sent him here for an eval. Pt does not know what his BP was. Pt only c/o feeling light headed x24 hours.

## 2014-10-01 NOTE — Discharge Instructions (Signed)
DASH Eating Plan DASH stands for "Dietary Approaches to Stop Hypertension." The DASH eating plan is a healthy eating plan that has been shown to reduce high blood pressure (hypertension). Additional health benefits may include reducing the risk of type 2 diabetes mellitus, heart disease, and stroke. The DASH eating plan may also help with weight loss. WHAT DO I NEED TO KNOW ABOUT THE DASH EATING PLAN? For the DASH eating plan, you will follow these general guidelines:  Choose foods with a percent daily value for sodium of less than 5% (as listed on the food label).  Use salt-free seasonings or herbs instead of table salt or sea salt.  Check with your health care provider or pharmacist before using salt substitutes.  Eat lower-sodium products, often labeled as "lower sodium" or "no salt added."  Eat fresh foods.  Eat more vegetables, fruits, and low-fat dairy products.  Choose whole grains. Look for the word "whole" as the first word in the ingredient list.  Choose fish and skinless chicken or turkey more often than red meat. Limit fish, poultry, and meat to 6 oz (170 g) each day.  Limit sweets, desserts, sugars, and sugary drinks.  Choose heart-healthy fats.  Limit cheese to 1 oz (28 g) per day.  Eat more home-cooked food and less restaurant, buffet, and fast food.  Limit fried foods.  Cook foods using methods other than frying.  Limit canned vegetables. If you do use them, rinse them well to decrease the sodium.  When eating at a restaurant, ask that your food be prepared with less salt, or no salt if possible. WHAT FOODS CAN I EAT? Seek help from a dietitian for individual calorie needs. Grains Whole grain or whole wheat bread. Brown rice. Whole grain or whole wheat pasta. Quinoa, bulgur, and whole grain cereals. Low-sodium cereals. Corn or whole wheat flour tortillas. Whole grain cornbread. Whole grain crackers. Low-sodium crackers. Vegetables Fresh or frozen vegetables  (raw, steamed, roasted, or grilled). Low-sodium or reduced-sodium tomato and vegetable juices. Low-sodium or reduced-sodium tomato sauce and paste. Low-sodium or reduced-sodium canned vegetables.  Fruits All fresh, canned (in natural juice), or frozen fruits. Meat and Other Protein Products Ground beef (85% or leaner), grass-fed beef, or beef trimmed of fat. Skinless chicken or turkey. Ground chicken or turkey. Pork trimmed of fat. All fish and seafood. Eggs. Dried beans, peas, or lentils. Unsalted nuts and seeds. Unsalted canned beans. Dairy Low-fat dairy products, such as skim or 1% milk, 2% or reduced-fat cheeses, low-fat ricotta or cottage cheese, or plain low-fat yogurt. Low-sodium or reduced-sodium cheeses. Fats and Oils Tub margarines without trans fats. Light or reduced-fat mayonnaise and salad dressings (reduced sodium). Avocado. Safflower, olive, or canola oils. Natural peanut or almond butter. Other Unsalted popcorn and pretzels. The items listed above may not be a complete list of recommended foods or beverages. Contact your dietitian for more options. WHAT FOODS ARE NOT RECOMMENDED? Grains White bread. White pasta. White rice. Refined cornbread. Bagels and croissants. Crackers that contain trans fat. Vegetables Creamed or fried vegetables. Vegetables in a cheese sauce. Regular canned vegetables. Regular canned tomato sauce and paste. Regular tomato and vegetable juices. Fruits Dried fruits. Canned fruit in light or heavy syrup. Fruit juice. Meat and Other Protein Products Fatty cuts of meat. Ribs, chicken wings, bacon, sausage, bologna, salami, chitterlings, fatback, hot dogs, bratwurst, and packaged luncheon meats. Salted nuts and seeds. Canned beans with salt. Dairy Whole or 2% milk, cream, half-and-half, and cream cheese. Whole-fat or sweetened yogurt. Full-fat   cheeses or blue cheese. Nondairy creamers and whipped toppings. Processed cheese, cheese spreads, or cheese  curds. Condiments Onion and garlic salt, seasoned salt, table salt, and sea salt. Canned and packaged gravies. Worcestershire sauce. Tartar sauce. Barbecue sauce. Teriyaki sauce. Soy sauce, including reduced sodium. Steak sauce. Fish sauce. Oyster sauce. Cocktail sauce. Horseradish. Ketchup and mustard. Meat flavorings and tenderizers. Bouillon cubes. Hot sauce. Tabasco sauce. Marinades. Taco seasonings. Relishes. Fats and Oils Butter, stick margarine, lard, shortening, ghee, and bacon fat. Coconut, palm kernel, or palm oils. Regular salad dressings. Other Pickles and olives. Salted popcorn and pretzels. The items listed above may not be a complete list of foods and beverages to avoid. Contact your dietitian for more information. WHERE CAN I FIND MORE INFORMATION? National Heart, Lung, and Blood Institute: www.nhlbi.nih.gov/health/health-topics/topics/dash/ Document Released: 03/22/2011 Document Revised: 08/17/2013 Document Reviewed: 02/04/2013 ExitCare Patient Information 2015 [employer]. This information is not intended to replace advice given to you by your health care provider. Make sure you discuss any questions you have with your health care provider. Hypertension Hypertension, commonly called high blood pressure, is when the force of blood pumping through your arteries is too strong. Your arteries are the blood vessels that carry blood from your heart throughout your body. A blood pressure reading consists of a higher number over a lower number, such as 110/72. The higher number (systolic) is the pressure inside your arteries when your heart pumps. The lower number (diastolic) is the pressure inside your arteries when your heart relaxes. Ideally you want your blood pressure below 120/80. Hypertension forces your heart to work harder to pump blood. Your arteries may become narrow or stiff. Having hypertension puts you at risk for heart disease, stroke, and other problems.  RISK  FACTORS Some risk factors for high blood pressure are controllable. Others are not.  Risk factors you cannot control include:   Race. You may be at higher risk if you are African American.  Age. Risk increases with age.  Gender. Men are at higher risk than women before age 45 years. After age 65, women are at higher risk than men. Risk factors you can control include:  Not getting enough exercise or physical activity.  Being overweight.  Getting too much fat, sugar, calories, or salt in your diet.  Drinking too much alcohol. SIGNS AND SYMPTOMS Hypertension does not usually cause signs or symptoms. Extremely high blood pressure (hypertensive crisis) may cause headache, anxiety, shortness of breath, and nosebleed. DIAGNOSIS  To check if you have hypertension, your health care provider will measure your blood pressure while you are seated, with your arm held at the level of your heart. It should be measured at least twice using the same arm. Certain conditions can cause a difference in blood pressure between your right and left arms. A blood pressure reading that is higher than normal on one occasion does not mean that you need treatment. If one blood pressure reading is high, ask your health care provider about having it checked again. TREATMENT  Treating high blood pressure includes making lifestyle changes and possibly taking medicine. Living a healthy lifestyle can help lower high blood pressure. You may need to change some of your habits. Lifestyle changes may include:  Following the DASH diet. This diet is high in fruits, vegetables, and whole grains. It is low in salt, red meat, and added sugars.  Getting at least 2 hours of brisk physical activity every week.  Losing weight if necessary.  Not smoking.  Limiting   alcoholic beverages.  Learning ways to reduce stress. If lifestyle changes are not enough to get your blood pressure under control, your health care provider may  prescribe medicine. You may need to take more than one. Work closely with your health care provider to understand the risks and benefits. HOME CARE INSTRUCTIONS  Have your blood pressure rechecked as directed by your health care provider.   Take medicines only as directed by your health care provider. Follow the directions carefully. Blood pressure medicines must be taken as prescribed. The medicine does not work as well when you skip doses. Skipping doses also puts you at risk for problems.   Do not smoke.   Monitor your blood pressure at home as directed by your health care provider. SEEK MEDICAL CARE IF:   You think you are having a reaction to medicines taken.  You have recurrent headaches or feel dizzy.  You have swelling in your ankles.  You have trouble with your vision. SEEK IMMEDIATE MEDICAL CARE IF:  You develop a severe headache or confusion.  You have unusual weakness, numbness, or feel faint.  You have severe chest or abdominal pain.  You vomit repeatedly.  You have trouble breathing. MAKE SURE YOU:   Understand these instructions.  Will watch your condition.  Will get help right away if you are not doing well or get worse. Document Released: 04/02/2005 Document Revised: 08/17/2013 Document Reviewed: 01/23/2013 ExitCare Patient Information 2015 [employer], [employer]. This information is not intended to replace advice given to you by your health care provider. Make sure you discuss any questions you have with your health care provider.  

## 2014-11-03 ENCOUNTER — Encounter (INDEPENDENT_AMBULATORY_CARE_PROVIDER_SITE_OTHER): Payer: Self-pay

## 2014-11-03 ENCOUNTER — Ambulatory Visit: Payer: Self-pay

## 2014-11-10 ENCOUNTER — Ambulatory Visit: Payer: Self-pay | Admitting: Internal Medicine

## 2014-12-01 ENCOUNTER — Ambulatory Visit: Payer: Self-pay | Admitting: Internal Medicine

## 2014-12-01 DIAGNOSIS — F1011 Alcohol abuse, in remission: Secondary | ICD-10-CM | POA: Insufficient documentation

## 2014-12-01 DIAGNOSIS — I10 Essential (primary) hypertension: Secondary | ICD-10-CM | POA: Insufficient documentation

## 2014-12-01 DIAGNOSIS — E790 Hyperuricemia without signs of inflammatory arthritis and tophaceous disease: Secondary | ICD-10-CM | POA: Insufficient documentation

## 2015-01-05 ENCOUNTER — Other Ambulatory Visit: Payer: Self-pay

## 2015-01-27 ENCOUNTER — Other Ambulatory Visit: Payer: Self-pay

## 2015-05-18 DIAGNOSIS — F1011 Alcohol abuse, in remission: Secondary | ICD-10-CM

## 2015-05-18 DIAGNOSIS — I10 Essential (primary) hypertension: Secondary | ICD-10-CM

## 2015-05-18 DIAGNOSIS — E790 Hyperuricemia without signs of inflammatory arthritis and tophaceous disease: Secondary | ICD-10-CM

## 2015-06-08 ENCOUNTER — Other Ambulatory Visit: Payer: Self-pay

## 2015-06-09 ENCOUNTER — Other Ambulatory Visit: Payer: Self-pay | Admitting: Internal Medicine

## 2015-06-09 DIAGNOSIS — M79605 Pain in left leg: Secondary | ICD-10-CM

## 2015-06-15 ENCOUNTER — Ambulatory Visit: Payer: Self-pay | Admitting: Internal Medicine

## 2015-06-15 ENCOUNTER — Ambulatory Visit
Admission: RE | Admit: 2015-06-15 | Discharge: 2015-06-15 | Disposition: A | Discharge status: Home / Self Care | Payer: Self-pay | Source: Ambulatory Visit | Attending: Internal Medicine | Admitting: Internal Medicine

## 2015-06-15 DIAGNOSIS — M79605 Pain in left leg: Secondary | ICD-10-CM | POA: Insufficient documentation

## 2015-06-21 ENCOUNTER — Telehealth: Payer: Self-pay

## 2015-06-21 NOTE — Telephone Encounter (Signed)
Patient called wanted to reschedule missed appointment. Edgar Mooney requested first available Wednesday appt, ok to go ahead and schedule an lm with appointment information. Scheduled appointment for Jun 29, 2015 9:30 lm @ [phone]

## 2015-06-23 ENCOUNTER — Telehealth: Payer: Self-pay

## 2015-06-23 NOTE — Telephone Encounter (Signed)
Left message @ [phone] with appointment information.

## 2015-06-23 NOTE — Telephone Encounter (Signed)
Patient called  Wanted to know , when is his next appointment  Did not leave call back number

## 2015-06-24 ENCOUNTER — Telehealth: Payer: Self-pay

## 2015-06-24 NOTE — Telephone Encounter (Signed)
Nasdir rescheduled patient's appointmentt due to clinic not open Wed  Jun 29, 2015. Appointment is now scheduled for Wed July 06, 2015 @ 11:15 lm @ [phone] leeting pt know of schedule change.

## 2015-06-29 ENCOUNTER — Ambulatory Visit: Payer: Self-pay | Admitting: Internal Medicine

## 2015-07-06 ENCOUNTER — Encounter: Payer: Self-pay | Admitting: Internal Medicine

## 2015-07-06 ENCOUNTER — Ambulatory Visit: Payer: Self-pay | Admitting: Internal Medicine

## 2015-07-06 VITALS — BP 128/70 | HR 64 | Temp 98.4°F | Wt 185.0 lb

## 2015-07-06 DIAGNOSIS — B182 Chronic viral hepatitis C: Secondary | ICD-10-CM

## 2015-07-06 DIAGNOSIS — I10 Essential (primary) hypertension: Secondary | ICD-10-CM

## 2015-07-06 DIAGNOSIS — M109 Gout, unspecified: Secondary | ICD-10-CM

## 2015-07-06 MED ORDER — HYDROCHLOROTHIAZIDE 25 MG PO TABS: 25.0000 mg | ORAL_TABLET | ORAL | Status: DC

## 2015-07-06 NOTE — Assessment & Plan Note (Signed)
Adversely impacting liver - causing inflammation of the liver.

## 2015-07-06 NOTE — Assessment & Plan Note (Signed)
Reducing BP meds will reduce flare ups

## 2015-07-06 NOTE — Assessment & Plan Note (Addendum)
BP is stable. Start taking HCTZ 3 times a week (Mondays, Wednesday, Fridays)

## 2015-07-06 NOTE — Progress Notes (Signed)
   Subjective:    Patient ID: Edgar Mooney, male    DOB: 17-Mar-1966, 50 y.o.   MRN: [id number]  HPI    Patient Active Problem List   Diagnosis Date Noted  . Gout 07/06/2015  . Hep C w/o coma, chronic ([place]) 07/06/2015  . HTN (hypertension) 12/01/2014  . Elevated blood uric acid level 12/01/2014  . History of ETOH abuse 12/01/2014   Pt presents with hypertension. Pt has been taking HCTZ for 6 months but BP has been improving.   Pt presents with gout.   Pt presents with hepatitis C. Pt has not gone to [facility].   Review of Systems     Objective:   Physical Exam  BP 128/70 mmHg  Pulse 64  Temp(Src) 98.4 F (36.9 C)  Wt 185 lb (83.915 kg)    Medication List       This list is accurate as of: 07/06/15 12:12 PM.  Always use your most recent med list.               allopurinol 300 MG tablet  Commonly known as:  ZYLOPRIM  Take 1 tablet (300 mg total) by mouth daily.     buPROPion 150 MG 12 hr tablet  Commonly known as:  WELLBUTRIN SR  Take 300 mg by mouth daily.     hydrochlorothiazide 25 MG tablet  Commonly known as:  HYDRODIURIL  Take 1 tablet (25 mg total) by mouth See admin instructions. Take Monday, Wednesday, Friday (3 times a week)     ibuprofen 800 MG tablet  Commonly known as:  ADVIL,MOTRIN  Take 1 tablet (800 mg total) by mouth 3 (three) times daily.     PROZAC PO  Take 40 mg by mouth daily.            Assessment & Plan:   HTN (hypertension) BP is stable. Start taking HCTZ 3 times a week (Mondays, Wednesday, Fridays)  Hep C w/o coma, chronic ([place]) Adversely impacting liver - causing inflammation of the liver.  Gout Reducing BP meds will reduce flare ups    Edgar Mooney was seen today for gout and hepatitis c.  Diagnoses and all orders for this visit:  Essential hypertension -     hydrochlorothiazide (HYDRODIURIL) 25 MG tablet; Take 1 tablet (25 mg total) by mouth See admin instructions. Take Monday, Wednesday, Friday (3 times a  week)  Hep C w/o coma, chronic ([facility]) -     Amb Referral to Hepatology  Gout of knee, unspecified cause, unspecified chronicity, unspecified laterality   Return in 6 weeks to check BP

## 2015-08-17 ENCOUNTER — Ambulatory Visit: Payer: Self-pay | Admitting: Internal Medicine

## 2015-08-20 ENCOUNTER — Emergency Department (HOSPITAL_COMMUNITY)
Admission: EM | Admit: 2015-08-20 | Discharge: 2015-08-20 | Disposition: A | Discharge status: Home / Self Care | Payer: PRIVATE HEALTH INSURANCE | Attending: Emergency Medicine | Admitting: Emergency Medicine

## 2015-08-20 ENCOUNTER — Encounter (HOSPITAL_COMMUNITY): Payer: Self-pay

## 2015-08-20 DIAGNOSIS — M25562 Pain in left knee: Secondary | ICD-10-CM | POA: Insufficient documentation

## 2015-08-20 DIAGNOSIS — Z8619 Personal history of other infectious and parasitic diseases: Secondary | ICD-10-CM | POA: Insufficient documentation

## 2015-08-20 DIAGNOSIS — Z79899 Other long term (current) drug therapy: Secondary | ICD-10-CM | POA: Insufficient documentation

## 2015-08-20 DIAGNOSIS — M109 Gout, unspecified: Secondary | ICD-10-CM | POA: Insufficient documentation

## 2015-08-20 DIAGNOSIS — Z791 Long term (current) use of non-steroidal anti-inflammatories (NSAID): Secondary | ICD-10-CM | POA: Insufficient documentation

## 2015-08-20 DIAGNOSIS — F329 Major depressive disorder, single episode, unspecified: Secondary | ICD-10-CM | POA: Insufficient documentation

## 2015-08-20 MED ORDER — PREDNISONE 20 MG PO TABS: 40.0000 mg | ORAL_TABLET | Freq: Every day | ORAL | Status: DC

## 2015-08-20 MED ORDER — OXYCODONE-ACETAMINOPHEN 5-325 MG PO TABS
1.0000 | ORAL_TABLET | Freq: Once | ORAL | Status: AC
  Administered 2015-08-20: 1 via ORAL
  Filled 2015-08-20: qty 1

## 2015-08-20 MED ORDER — COLCHICINE 0.6 MG PO TABS
1.2000 mg | ORAL_TABLET | Freq: Once | ORAL | Status: AC
  Administered 2015-08-20: 1.2 mg via ORAL
  Filled 2015-08-20: qty 2

## 2015-08-20 MED ORDER — INDOMETHACIN 25 MG PO CAPS: 25.0000 mg | ORAL_CAPSULE | Freq: Three times a day (TID) | ORAL | Status: DC | PRN

## 2015-08-20 MED ORDER — PREDNISONE 20 MG PO TABS
60.0000 mg | ORAL_TABLET | Freq: Once | ORAL | Status: AC
  Administered 2015-08-20: 60 mg via ORAL
  Filled 2015-08-20: qty 3

## 2015-08-20 NOTE — ED Provider Notes (Signed)
CSN: [id number]     Arrival date & time 08/20/15  1129 History  By signing my name below, I, Soijett Blue, attest that this documentation has been prepared under the direction and in the presence of Harlene Ramus, PA-C Electronically Signed: Soijett Blue, ED Scribe. 08/20/2015. 1:50 PM.   Chief Complaint  Patient presents with  . Joint Swelling      The history is provided by the patient. No language interpreter was used.    Edgar Mooney is a 50 y.o. male with a medical hx of gout who presents to the Emergency Department complaining of left knee swelling and pain onset 4 days. Pt states that his left knee pain is worsened with standing, movement, or bearing weight. Pt notes that he always has gout flare ups to his left knee and this current episode feels similar to gout flares he has had in the past. Pt states that his last gout flare was 2-3 months ago. Denies fall or trauma to his left knee. Pt has taken indomethacin in the past, but doesn't have a current Rx for it. Pt is having associated symptoms of tingling in left toes. He notes that he has not tried any medications for the relief of his symptoms. Pt reports that he typically takes ibuprofen for his symptoms. He denies fever, redness, numbness, and any other symptoms. Denies allergies to medications.    Past Medical History  Diagnosis Date  . Alcohol abuse   . Cocaine abuse   . Gout   . Hep C w/o coma, chronic [facility]) diagnosed May 2016  . Depression    History reviewed. No pertinent past surgical history. Family History  Problem Relation Age of Onset  . Cancer Mother   . Cancer Father    Social History  Substance Use Topics  . Smoking status: Current Some Day Smoker -- 1.00 packs/day for 25 years    Types: Cigarettes  . Smokeless tobacco: Never Used  . Alcohol Use: No     Comment: Soberity 01/24/12    Review of Systems  Constitutional: Negative for fever.  Musculoskeletal: Positive for joint swelling and arthralgias.   Skin: Negative for color change.  Neurological: Negative for numbness.    Allergies  Review of patient's allergies indicates no known allergies.  Home Medications   Prior to Admission medications   Medication Sig Start Date End Date Taking? Authorizing Provider  allopurinol (ZYLOPRIM) 300 MG tablet Take 1 tablet (300 mg total) by mouth daily. 02/13/12   Kaitlyn Szekalski, PA-C  buPROPion (WELLBUTRIN SR) 150 MG 12 hr tablet Take 300 mg by mouth daily.    Historical Provider, MD  FLUoxetine HCl (PROZAC PO) Take 40 mg by mouth daily.     Historical Provider, MD  hydrochlorothiazide (HYDRODIURIL) 25 MG tablet Take 1 tablet (25 mg total) by mouth See admin instructions. Take Monday, Wednesday, Friday (3 times a week) 07/06/15   Tawni Millers, MD  ibuprofen (ADVIL,MOTRIN) 800 MG tablet Take 1 tablet (800 mg total) by mouth 3 (three) times daily. 02/13/12   Kaitlyn Szekalski, PA-C  indomethacin (INDOCIN) 25 MG capsule Take 1 capsule (25 mg total) by mouth 3 (three) times daily as needed. 08/20/15   Nona Dell, PA-C  predniSONE (DELTASONE) 20 MG tablet Take 2 tablets (40 mg total) by mouth daily. 08/20/15   Chesley Noon [place], PA-C   BP 110/87 mmHg  Pulse 68  Temp(Src) 98.7 F (37.1 C) (Oral)  Resp 18  SpO2 96% Physical Exam  Constitutional: He is oriented to person, place, and time. He appears well-developed and well-nourished. No distress.  HENT:  Head: Normocephalic and atraumatic.  Eyes: EOM are normal.  Neck: Neck supple.  Cardiovascular: Normal rate.   Pulmonary/Chest: Effort normal. No respiratory distress.  Abdominal: He exhibits no distension.  Musculoskeletal:       Left knee: He exhibits decreased range of motion (due to pain and swelling) and swelling. He exhibits no effusion, no ecchymosis, no deformity, no laceration, no erythema, normal alignment, no LCL laxity, normal patellar mobility and no MCL laxity. Tenderness (diffuse) found.  Mild diffuse tenderness  and swelling noted to left knee. 2+ PT pulse. Sensation grossly intact.   Neurological: He is alert and oriented to person, place, and time.  Skin: Skin is warm and dry.  Psychiatric: He has a normal mood and affect. His behavior is normal.  Nursing note and vitals reviewed.   ED Course  Procedures (including critical care time) DIAGNOSTIC STUDIES: Oxygen Saturation is 96% on RA, nl by my interpretation.    COORDINATION OF CARE: 1:29 PM Discussed treatment plan with pt at bedside which includes Indomethacin Rx and pt agreed to plan.    Labs Review Labs Reviewed - No data to display  Imaging Review No results found.    EKG Interpretation None      MDM   Final diagnoses:  Left knee pain    Pt presents with monoarticular pain, swelling. Pt reports pain is consistent with gout flares he has had in the past.  Pt is afebrile and stable. Renal function good. Pt without known peptic ulcer disease and not receiving concurrent treatment on warfarin. Pt dc with indomethacin (50 mg PO TID) and prednisone. Discussed that pt should respond to treatment with in 24 hour of begining treatment & likely resolve in 2-3 days. Advised pt to follow up with his PCP. Discussed strict return precautions with pt.   I personally performed the services described in this documentation, which was scribed in my presence. The recorded information has been reviewed and is accurate.    Chesley Noon [place], [place] 08/20/15 [place], MD 08/20/15 [phone]

## 2015-08-20 NOTE — Discharge Instructions (Signed)
Take your medication as prescribed. Follow-up with your primary care provider in 4-5 days. Return to the emergency department if symptoms worsen or new onset of fever, redness, swelling, warmth, numbness, tingling, weakness.

## 2015-08-20 NOTE — ED Notes (Signed)
Patient here with left knee pain and swelling. States that his gout is acting up, no trauma

## 2016-01-04 ENCOUNTER — Encounter (HOSPITAL_COMMUNITY): Payer: Self-pay | Admitting: Emergency Medicine

## 2016-01-04 ENCOUNTER — Emergency Department (HOSPITAL_COMMUNITY)
Admission: EM | Admit: 2016-01-04 | Discharge: 2016-01-05 | Disposition: A | Discharge status: Other Acute Inpatient Hospital | Payer: No Typology Code available for payment source | Attending: Emergency Medicine | Admitting: Emergency Medicine

## 2016-01-04 DIAGNOSIS — R45851 Suicidal ideations: Secondary | ICD-10-CM

## 2016-01-04 DIAGNOSIS — F332 Major depressive disorder, recurrent severe without psychotic features: Secondary | ICD-10-CM | POA: Diagnosis present

## 2016-01-04 DIAGNOSIS — F1910 Other psychoactive substance abuse, uncomplicated: Secondary | ICD-10-CM

## 2016-01-04 DIAGNOSIS — F1310 Sedative, hypnotic or anxiolytic abuse, uncomplicated: Secondary | ICD-10-CM | POA: Diagnosis present

## 2016-01-04 DIAGNOSIS — F17210 Nicotine dependence, cigarettes, uncomplicated: Secondary | ICD-10-CM | POA: Insufficient documentation

## 2016-01-04 DIAGNOSIS — F1410 Cocaine abuse, uncomplicated: Secondary | ICD-10-CM | POA: Diagnosis present

## 2016-01-04 DIAGNOSIS — F1320 Sedative, hypnotic or anxiolytic dependence, uncomplicated: Secondary | ICD-10-CM | POA: Insufficient documentation

## 2016-01-04 DIAGNOSIS — Z79899 Other long term (current) drug therapy: Secondary | ICD-10-CM | POA: Insufficient documentation

## 2016-01-04 LAB — ACETAMINOPHEN LEVEL: Acetaminophen (Tylenol), Serum: <10 ug/mL — ABNORMAL LOW (ref 10–30)

## 2016-01-04 LAB — COMPREHENSIVE METABOLIC PANEL
ALT: 26 U/L (ref 17–63)
AST: 30 U/L (ref 15–41)
Albumin: 4.2 g/dL (ref 3.5–5.0)
Alkaline Phosphatase: 80 U/L (ref 38–126)
Anion gap: 10 (ref 5–15)
BUN: 12 mg/dL (ref 6–20)
CO2: 23 mmol/L (ref 22–32)
Calcium: 9.5 mg/dL (ref 8.9–10.3)
Chloride: 106 mmol/L (ref 101–111)
Creatinine, Ser: 0.89 mg/dL (ref 0.61–1.24)
GFR calc Af Amer: >60 mL/min (ref >60)
GFR calc non Af Amer: >60 mL/min (ref >60)
Glucose, Bld: 101 mg/dL — ABNORMAL HIGH (ref 65–99)
Potassium: 3.9 mmol/L (ref 3.5–5.1)
Sodium: 139 mmol/L (ref 135–145)
Total Bilirubin: 2.6 mg/dL — ABNORMAL HIGH (ref 0.3–1.2)
Total Protein: 7.9 g/dL (ref 6.5–8.1)

## 2016-01-04 LAB — RAPID URINE DRUG SCREEN, HOSP PERFORMED
Amphetamines: NOT DETECTED
Barbiturates: NOT DETECTED
Benzodiazepines: POSITIVE — AB
Cocaine: POSITIVE — AB
Opiates: NOT DETECTED
Tetrahydrocannabinol: POSITIVE — AB

## 2016-01-04 LAB — CBC
HCT: 48.3 % (ref 39.0–52.0)
Hemoglobin: 16.9 g/dL (ref 13.0–17.0)
MCH: 31.1 pg (ref 26.0–34.0)
MCHC: 35 g/dL (ref 30.0–36.0)
MCV: 88.8 fL (ref 78.0–100.0)
Platelets: 121 10*3/uL — ABNORMAL LOW (ref 150–400)
RBC: 5.44 MIL/uL (ref 4.22–5.81)
RDW: 13.4 % (ref 11.5–15.5)
WBC: 6.4 10*3/uL (ref 4.0–10.5)

## 2016-01-04 LAB — ETHANOL: Alcohol, Ethyl (B): <5 mg/dL (ref <5)

## 2016-01-04 LAB — SALICYLATE LEVEL: Salicylate Lvl: <4 mg/dL (ref 2.8–30.0)

## 2016-01-04 MED ORDER — LORAZEPAM 1 MG PO TABS: 0.0000 mg | ORAL_TABLET | Freq: Two times a day (BID) | ORAL | Status: DC

## 2016-01-04 MED ORDER — VITAMIN B-1 100 MG PO TABS
100.0000 mg | ORAL_TABLET | Freq: Every day | ORAL | Status: DC
  Administered 2016-01-04 – 2016-01-05 (×2): 100 mg via ORAL
  Filled 2016-01-04 (×2): qty 1

## 2016-01-04 MED ORDER — BUPROPION HCL ER (SR) 150 MG PO TB12
300.0000 mg | ORAL_TABLET | Freq: Every day | ORAL | Status: DC
  Administered 2016-01-04 – 2016-01-05 (×2): 300 mg via ORAL
  Filled 2016-01-04 (×2): qty 2

## 2016-01-04 MED ORDER — LORAZEPAM 1 MG PO TABS
0.0000 mg | ORAL_TABLET | Freq: Four times a day (QID) | ORAL | Status: DC
  Administered 2016-01-04: 1 mg via ORAL

## 2016-01-04 MED ORDER — LORAZEPAM 1 MG PO TABS
1.0000 mg | ORAL_TABLET | Freq: Three times a day (TID) | ORAL | Status: DC | PRN
  Filled 2016-01-04: qty 1

## 2016-01-04 MED ORDER — ZOLPIDEM TARTRATE 5 MG PO TABS: 5.0000 mg | ORAL_TABLET | Freq: Every evening | ORAL | Status: DC | PRN

## 2016-01-04 MED ORDER — HYDROCHLOROTHIAZIDE 25 MG PO TABS: 25.0000 mg | ORAL_TABLET | ORAL | Status: DC

## 2016-01-04 MED ORDER — NICOTINE 21 MG/24HR TD PT24
21.0000 mg | MEDICATED_PATCH | Freq: Every day | TRANSDERMAL | Status: DC
Start: 2016-01-04 — End: 2016-01-05
  Administered 2016-01-04 – 2016-01-05 (×2): 21 mg via TRANSDERMAL
  Filled 2016-01-04 (×2): qty 1

## 2016-01-04 MED ORDER — ONDANSETRON HCL 4 MG PO TABS: 4.0000 mg | ORAL_TABLET | Freq: Three times a day (TID) | ORAL | Status: DC | PRN

## 2016-01-04 MED ORDER — THIAMINE HCL 100 MG/ML IJ SOLN: 100.0000 mg | Freq: Every day | INTRAMUSCULAR | Status: DC

## 2016-01-04 MED ORDER — ALLOPURINOL 300 MG PO TABS: 300.0000 mg | ORAL_TABLET | Freq: Every day | ORAL | Status: DC

## 2016-01-04 MED ORDER — ALUM & MAG HYDROXIDE-SIMETH 200-200-20 MG/5ML PO SUSP: 30.0000 mL | ORAL | Status: DC | PRN

## 2016-01-04 MED ORDER — ACETAMINOPHEN 325 MG PO TABS: 650.0000 mg | ORAL_TABLET | ORAL | Status: DC | PRN

## 2016-01-04 MED ORDER — FLUOXETINE HCL 20 MG PO CAPS
40.0000 mg | ORAL_CAPSULE | Freq: Every day | ORAL | Status: DC
  Administered 2016-01-04 – 2016-01-05 (×2): 40 mg via ORAL
  Filled 2016-01-04 (×2): qty 2

## 2016-01-04 MED ORDER — IBUPROFEN 200 MG PO TABS: 600.0000 mg | ORAL_TABLET | Freq: Three times a day (TID) | ORAL | Status: DC | PRN

## 2016-01-04 NOTE — ED Triage Notes (Signed)
Patient states he wants to be "detoxed off of alcohol, cocaine, not wanting to live anymore." Patient states his last alcoholic drink was at about 12:00 today. Patient last used cocaine last night. Patient states he is suicidal. Denies plan at this time. Denies HI and audiovisual hallucinations.

## 2016-01-04 NOTE — BH Assessment (Addendum)
Tele Assessment Note   Edgar Mooney is an 50 y.o. male, who presents voluntarily and unaccompanied to [facility]. Pt reports being "really depressed." Pt reported he has been having thoughts of suicide. Pt reported: "What's the use of living."  Pt reported wanting to hurt/kill himself. Pt reported, he would probably overdose if he commits suicide. Pt reported two days ago he heard a man's voice telling him to do more drugs and "shadow man." Pt denies HI and self-harming behaviors. Pt reported experiencing the following depressive and anxiety symptoms: sadness/low mood, irritability, weight changes (pt reported loosing 50-60 lbs, over the span of 2-3 months), fatigue, feeling hopeless/worthless, difficulty concentrating, low self esteem, crying, excessive worry.   Pt denied verbal, physical and sexual abuse. Pt reported, drinking two, twenty four oz beers today at noon; pt reported drinking eighteen cans of beer per day. Pt reported using Heroin "about three days ago", pt reported using "a 20 bag." Pt reported using $100 worth of Cocaine last night. Pt denied seeing an psychiatrist and therapist. Pt denied being prescribed medication. Pt denied previous inpatient admissions. Pt reported previous rehabilitation admissions. Pt reported, he went [facility], in [place], [place] for about two years; he went to [facility] in [place], [place] in 2016 for ten months; and he went to [facility] in [place], [place] in 2017 for three months. Pt denied seeing an psychiatrist and therapist. Pt denied being prescribed medication. Pt denied previous inpatient admissions.  Pt was yawning, in scrubs, with logical/coherent speech. Pt's eye contact was fair. Pt's judgement was impaired. Pt's insight and impulse control was poor. Pt's mood/affect was depressed and anxious. Pt's thought process was coherent/relevant. Pt was oriented x3 (year, city and state). Pt was cooperative during the assessment. Pt reported if inpatient treatment is recommended  he would sign himself in voluntarily.   Diagnosis: F10.20 Alcohol Use Disorder, Severe.                   F14.20 Cocaine Use Disorder, Moderate.                   F33.3  Major Depressive Disorder, Recurrent, Severe, with Psychotic Features.   Past Medical History:  Past Medical History:  Diagnosis Date  . Alcohol abuse   . Cocaine abuse   . Depression   . Gout   . Hep C w/o coma, chronic [facility]) diagnosed May 2016    History reviewed. No pertinent surgical history.  Family History:  Family History  Problem Relation Age of Onset  . Cancer Mother   . Cancer Father     Social History:  reports that he has been smoking Cigarettes.  He has a 25.00 pack-year smoking history. He has never used smokeless tobacco. He reports that he drinks alcohol. He reports that he uses drugs, including Cocaine and IV.  Additional Social History:  Alcohol / Drug Use Pain Medications: Pt denies.  Prescriptions: Pt denies.  Over the Counter: Pt denies.  History of alcohol / drug use?: Yes Substance #1 Name of Substance 1: Alcohol 1 - Age of First Use: Pt reported: "[age]."  1 - Amount (size/oz): Pt reported today, two-twentyfour oz. beers; everyday eighteen pack of beer. 1 - Frequency: Pt reported everyday he drinks an eighteen pack of beer. 1 - Duration: Pt reported everyday.  1 - Last Use / Amount: Pt reported today (12/2015) at noon.  Substance #2 Name of Substance 2: Heroin 2 - Age of First Use: Pt reported: "[age]."  2 - Amount (size/oz): Pt reported: "a twenty bag."  2 - Frequency: UTA 2 - Duration: UTA 2 - Last Use / Amount: Pt reported about three days ago.  Substance #3 Name of Substance 3: Cocaine/Crack 3 - Age of First Use: Pt reported: "[age]."  3 - Amount (size/oz): Pt reported: $100 worth."  3 - Frequency: Pt reported he used last night, (01/03/16). 3 - Duration: UTA 3 - Last Use / Amount: Pt reported, he used last night, (01/03/16).  CIWA: CIWA-Ar BP:  123/85 Pulse Rate: 79 Nausea and Vomiting: no nausea and no vomiting Tactile Disturbances: none Tremor: no tremor Auditory Disturbances: not present Paroxysmal Sweats: no sweat visible Visual Disturbances: not present Anxiety: mildly anxious Headache, Fullness in Head: none present Agitation: normal activity Orientation and Clouding of Sensorium: oriented and can do serial additions CIWA-Ar Total: 1 COWS:    PATIENT STRENGTHS: (choose at least two) Average or above average intelligence Communication skills  Allergies: No Known Allergies  Home Medications:  (Not in a hospital admission)  OB/GYN Status:  No LMP for male patient.  General Assessment Data [facility] Assessment: In system Is this a Tele or Face-to-Face Assessment?: Face-to-Face Is this an Initial Assessment or a Re-assessment for this encounter?: Initial Assessment Marital status: Single Maiden name:  (NA) Is patient pregnant?: No Pregnancy Status: No Living Arrangements: Other (Comment) (Homeless) Can pt return to current living arrangement?: Yes Admission Status: Voluntary Is patient capable of signing voluntary admission?: Yes Referral Source: Self/Family/Friend Insurance type: Nutritional therapist Care Plan Living Arrangements: Other (Comment) (Homeless) Legal Guardian: Other: (Self) Name of Psychiatrist: NA Name of Therapist: NA  Education Status Is patient currently in school?: No Current Grade: NA Highest grade of school patient has completed: 10th grade Name of school: NA Contact person: NA  Risk to self with the past 6 months Suicidal Ideation: Yes-Currently Present Has patient been a risk to self within the past 6 months prior to admission? : Yes Suicidal Intent: Yes-Currently Present Has patient had any suicidal intent within the past 6 months prior to admission? : Yes Is patient at risk for suicide?: Yes Suicidal Plan?: Yes-Currently Present Has  patient had any suicidal plan within the past 6 months prior to admission? : Yes Specify Current Suicidal Plan:  (Pt reported overdosing.) Access to Means: Yes Specify Access to Suicidal Means: Pt has access to drugs.  What has been your use of drugs/alcohol within the last 12 months?:  (Alcohol, 18 beers daily, Heroin 20 bag three days ago) Previous Attempts/Gestures: No How many times?:  (0) Other Self Harm Risks:  (NA) Triggers for Past Attempts: Other (Comment) (Pt reported doing drugs.) Intentional Self Injurious Behavior: None (Pt denies.) Family Suicide History: Yes (Pt reported his uncle and first cousin (uncle's son). ) Recent stressful life event(s):  (Pt reported doing drugs. ) Persecutory voices/beliefs?: Yes Depression: Yes Depression Symptoms: Loss of interest in usual pleasures, Feeling worthless/self pity, Feeling angry/irritable, Fatigue, Tearfulness Substance abuse history and/or treatment for substance abuse?: Yes Suicide prevention information given to non-admitted patients: Not applicable  Risk to Others within the past 6 months Homicidal Ideation: No (Pt denies.) Does patient have any lifetime risk of violence toward others beyond the six months prior to admission? : No Thoughts of Harm to Others: No (Pt denies. ) Current Homicidal Intent: No Current Homicidal Plan: No (Pt denies. ) Access to Homicidal Means: No (Pt denies. ) Identified Victim:  (NA)  History of harm to others?: No (Pt denies. ) Assessment of Violence: None Noted Violent Behavior Description:  (NA) Does patient have access to weapons?: No (Pt denies. ) Criminal Charges Pending?: No Does patient have a court date: No Is patient on probation?: No  Psychosis Hallucinations: Auditory, Visual Delusions: None noted  Mental Status Report Appearance/Hygiene: In scrubs Eye Contact: Fair Motor Activity: Unremarkable Speech: Logical/coherent Level of Consciousness: Other (Comment) (Pt was yawning  during assessment, sleepy.) Mood: Depressed, Anxious Affect: Depressed, Anxious Anxiety Level: Minimal Thought Processes: Coherent, Relevant Judgement: Impaired Orientation: Other (Comment) (year, city, and state.) Obsessive Compulsive Thoughts/Behaviors: Unable to Assess  Cognitive Functioning Concentration: Fair Memory: Recent Intact, Remote Intact IQ: Average Insight: Poor Impulse Control: Poor Appetite: Poor Weight Loss:  (Pt reported 50-60 pounds from the last 2-3 months.) Weight Gain: 0 Sleep: Decreased Total Hours of Sleep: 4 Vegetative Symptoms: None  ADLScreening [facility] Assessment Services) Patient's cognitive ability adequate to safely complete daily activities?: Yes Patient able to express need for assistance with ADLs?: Yes Independently performs ADLs?: Yes (appropriate for developmental age)  Prior Inpatient Therapy Prior Inpatient Therapy: Yes Prior Therapy Dates:  (2017, 2016, 2015) Prior Therapy Facilty/Provider(s):  ([facility], [facility], [facility]. ) Reason for Treatment:  (Substance abuse treatment.)  Prior Outpatient Therapy Prior Outpatient Therapy: No Prior Therapy Dates: NA Prior Therapy Facilty/Provider(s):  (NA) Reason for Treatment: NA Does patient have an ACCT team?: No Does patient have Intensive In-House Services?  : No Does patient have [facility] services? : No Does patient have P4CC services?: No  ADL Screening (condition at time of admission) Patient's cognitive ability adequate to safely complete daily activities?: Yes Is the patient deaf or have difficulty hearing?: No Does the patient have difficulty seeing, even when wearing glasses/contacts?: No Does the patient have difficulty concentrating, remembering, or making decisions?: Yes (Pt reported difficulty concentrating. ) Patient able to express need for assistance with ADLs?: Yes Does the patient have difficulty dressing or bathing?: No Independently performs ADLs?: Yes (appropriate for  developmental age) Does the patient have difficulty walking or climbing stairs?: No Weakness of Legs: None Weakness of Arms/Hands: None       Abuse/Neglect Assessment (Assessment to be complete while patient is alone) Physical Abuse: Denies (Pt denies. ) Verbal Abuse: Denies (Pt denies. ) Sexual Abuse: Denies (Pt denies. ) Exploitation of patient/patient's resources: Denies (Pt denies. ) Self-Neglect: Denies (Pt denies. )     Advance Directives (For Healthcare) Does patient have an advance directive?: No Would patient like information on creating an advanced directive?: No - patient declined information    Additional Information 1:1 In Past 12 Months?: No CIRT Risk: No Elopement Risk: No Does patient have medical clearance?: Yes     Disposition: Per Lindon Romp, FNP, pt meets inpatient criteria. Disposition was discussed with Abby, RN. TTS will seek placement.   Disposition Initial Assessment Completed for this Encounter: Yes Disposition of Patient: Inpatient treatment program Type of inpatient treatment program: Adult  Edd Fabian 01/04/2016 9:20 PM   Edd Fabian, MS, [facility], [facility] Triage Specialist [phone]

## 2016-01-04 NOTE — ED Notes (Signed)
Bed: [facility] Expected date:  Expected time:  Means of arrival:  Comments: 

## 2016-01-04 NOTE — ED Provider Notes (Signed)
[place] DEPT Provider Note   CSN: [id number] Arrival date & time: 01/04/16  1815     History   Chief Complaint Chief Complaint  Patient presents with  . Detox  . Suicidal    HPI Edgar Mooney is a 50 y.o. male.  The history is provided by the patient and medical records.    50 year old male with history of alcohol abuse, cocaine abuse, depression, gout, hepatitis C, presenting to the ED for suicidal ideation. He states he has been an alcoholic and abusing cocaine for the past several years. He states recently his cocaine use has increased and he is using it in multiple forms including smoking, snorting, and IV. He states he last used cocaine last night, his last alcoholic drink was today around noon.  States he has been begun to feel suicidal as he states "I have nothing left to live for".  States he would rather be dead than continue going on this way. He has not attempted to harm himself in any way and does not have a plan in which to do this. He denies any homicidal ideation. No auditory or visual hallucinations. He reports at one time he was on medications for depression, has not been taking them recently. He is not currently established with a psychiatrist.  Past Medical History:  Diagnosis Date  . Alcohol abuse   . Cocaine abuse   . Depression   . Gout   . Hep C w/o coma, chronic [facility]) diagnosed May 2016    Patient Active Problem List   Diagnosis Date Noted  . Gout 07/06/2015  . Hep C w/o coma, chronic ([place]) 07/06/2015  . HTN (hypertension) 12/01/2014  . Elevated blood uric acid level 12/01/2014  . History of ETOH abuse 12/01/2014    History reviewed. No pertinent surgical history.     Home Medications    Prior to Admission medications   Medication Sig Start Date End Date Taking? Authorizing Provider  allopurinol (ZYLOPRIM) 300 MG tablet Take 1 tablet (300 mg total) by mouth daily. 02/13/12   Kaitlyn Szekalski, PA-C  buPROPion (WELLBUTRIN SR) 150 MG 12  hr tablet Take 300 mg by mouth daily.    Historical Provider, MD  FLUoxetine HCl (PROZAC PO) Take 40 mg by mouth daily.     Historical Provider, MD  hydrochlorothiazide (HYDRODIURIL) 25 MG tablet Take 1 tablet (25 mg total) by mouth See admin instructions. Take Monday, Wednesday, Friday (3 times a week) 07/06/15   Tawni Millers, MD  ibuprofen (ADVIL,MOTRIN) 800 MG tablet Take 1 tablet (800 mg total) by mouth 3 (three) times daily. 02/13/12   Kaitlyn Szekalski, PA-C  indomethacin (INDOCIN) 25 MG capsule Take 1 capsule (25 mg total) by mouth 3 (three) times daily as needed. 08/20/15   Nona Dell, PA-C  predniSONE (DELTASONE) 20 MG tablet Take 2 tablets (40 mg total) by mouth daily. 08/20/15   Nona Dell, PA-C    Family History Family History  Problem Relation Age of Onset  . Cancer Mother   . Cancer Father     Social History Social History  Substance Use Topics  . Smoking status: Current Some Day Smoker    Packs/day: 1.00    Years: 25.00    Types: Cigarettes  . Smokeless tobacco: Never Used  . Alcohol use Yes     Comment: Soberity 01/24/12     Allergies   Review of patient's allergies indicates no known allergies.   Review of Systems Review of Systems  Psychiatric/Behavioral: Positive for suicidal ideas.  All other systems reviewed and are negative.    Physical Exam Updated Vital Signs BP 146/85   Pulse 70   Temp 98.9 F (37.2 C) (Oral)   Resp 16   Ht 5\' 2"  (1.575 m)   Wt 63.5 kg   SpO2 98%   BMI 25.61 kg/m   Physical Exam  Constitutional: He is oriented to person, place, and time. He appears well-developed and well-nourished.  HENT:  Head: Normocephalic and atraumatic.  Mouth/Throat: Oropharynx is clear and moist.  Eyes: Conjunctivae and EOM are normal. Pupils are equal, round, and reactive to light.  Neck: Normal range of motion.  Cardiovascular: Normal rate, regular rhythm and normal heart sounds.   Pulmonary/Chest: Effort normal and  breath sounds normal.  Abdominal: Soft. Bowel sounds are normal.  Musculoskeletal: Normal range of motion.  Neurological: He is alert and oriented to person, place, and time.  Skin: Skin is warm and dry.  Psychiatric: He has a normal mood and affect. He is not actively hallucinating. He expresses suicidal ideation. He expresses no homicidal ideation. He expresses no suicidal plans and no homicidal plans.  SI without plan Denies HI or AVH  Nursing note and vitals reviewed.    ED Treatments / Results  Labs (all labs ordered are listed, but only abnormal results are displayed) Labs Reviewed  COMPREHENSIVE METABOLIC PANEL - Abnormal; Notable for the following:       Result Value   Glucose, Bld 101 (*)    Total Bilirubin 2.6 (*)    All other components within normal limits  ACETAMINOPHEN LEVEL - Abnormal; Notable for the following:    Acetaminophen (Tylenol), Serum <10 (*)    All other components within normal limits  CBC - Abnormal; Notable for the following:    Platelets 121 (*)    All other components within normal limits  ETHANOL  SALICYLATE LEVEL  URINE RAPID DRUG SCREEN, HOSP PERFORMED    EKG  EKG Interpretation None       Radiology No results found.  Procedures Procedures (including critical care time)  Medications Ordered in ED Medications  LORazepam (ATIVAN) tablet 0-4 mg (not administered)    Followed by  LORazepam (ATIVAN) tablet 0-4 mg (not administered)  thiamine (VITAMIN B-1) tablet 100 mg (not administered)    Or  thiamine (B-1) injection 100 mg (not administered)  alum & mag hydroxide-simeth (MAALOX/MYLANTA) 200-200-20 MG/5ML suspension 30 mL (not administered)  ondansetron (ZOFRAN) tablet 4 mg (not administered)  nicotine (NICODERM CQ - dosed in mg/24 hours) patch 21 mg (not administered)  zolpidem (AMBIEN) tablet 5 mg (not administered)  ibuprofen (ADVIL,MOTRIN) tablet 600 mg (not administered)  acetaminophen (TYLENOL) tablet 650 mg (not  administered)  LORazepam (ATIVAN) tablet 1 mg (not administered)  buPROPion (WELLBUTRIN SR) 12 hr tablet 300 mg (not administered)  FLUoxetine (PROZAC) capsule 40 mg (not administered)     Initial Impression / Assessment and Plan / ED Course  I have reviewed the triage vital signs and the nursing notes.  Pertinent labs & imaging results that were available during my care of the patient were reviewed by me and considered in my medical decision making (see chart for details).  Clinical Course   50 year old male here with suicidal ideation and polysubstance abuse. Reports chronic use of alcohol and cocaine. Here he is awake, alert, fully oriented to his baseline. He denies any chest pain or shortness of breath. He does endorse suicidal ideation without plan.  Screening labs  reassuring.  Patient medically cleared.  TTS eval pending.  Patient on CIWA protocol given his hx of EtOH abuse.  Final Clinical Impressions(s) / ED Diagnoses   Final diagnoses:  Suicidal ideation  Polysubstance abuse    New Prescriptions New Prescriptions   No medications on file     [name], PA-C 01/04/16 2059    [name], PA-C 01/04/16 2100    Dorie Rank, MD 01/04/16 [phone]

## 2016-01-05 ENCOUNTER — Inpatient Hospital Stay (HOSPITAL_COMMUNITY)
Admission: AD | Admit: 2016-01-05 | Discharge: 2016-01-11 | DRG: 885 | Disposition: A | Discharge status: Home / Self Care | Payer: No Typology Code available for payment source | Source: Intra-hospital | Attending: Psychiatry | Admitting: Psychiatry

## 2016-01-05 ENCOUNTER — Encounter (HOSPITAL_COMMUNITY): Payer: Self-pay

## 2016-01-05 DIAGNOSIS — F333 Major depressive disorder, recurrent, severe with psychotic symptoms: Principal | ICD-10-CM | POA: Diagnosis present

## 2016-01-05 DIAGNOSIS — F1310 Sedative, hypnotic or anxiolytic abuse, uncomplicated: Secondary | ICD-10-CM | POA: Diagnosis present

## 2016-01-05 DIAGNOSIS — R45851 Suicidal ideations: Secondary | ICD-10-CM | POA: Diagnosis present

## 2016-01-05 DIAGNOSIS — Z818 Family history of other mental and behavioral disorders: Secondary | ICD-10-CM

## 2016-01-05 DIAGNOSIS — F332 Major depressive disorder, recurrent severe without psychotic features: Secondary | ICD-10-CM | POA: Diagnosis not present

## 2016-01-05 DIAGNOSIS — F17210 Nicotine dependence, cigarettes, uncomplicated: Secondary | ICD-10-CM | POA: Diagnosis present

## 2016-01-05 DIAGNOSIS — Z79899 Other long term (current) drug therapy: Secondary | ICD-10-CM | POA: Diagnosis not present

## 2016-01-05 DIAGNOSIS — B182 Chronic viral hepatitis C: Secondary | ICD-10-CM | POA: Diagnosis present

## 2016-01-05 DIAGNOSIS — F1410 Cocaine abuse, uncomplicated: Secondary | ICD-10-CM | POA: Diagnosis present

## 2016-01-05 DIAGNOSIS — G4700 Insomnia, unspecified: Secondary | ICD-10-CM | POA: Diagnosis present

## 2016-01-05 DIAGNOSIS — Z590 Homelessness: Secondary | ICD-10-CM

## 2016-01-05 DIAGNOSIS — I10 Essential (primary) hypertension: Secondary | ICD-10-CM | POA: Diagnosis present

## 2016-01-05 DIAGNOSIS — F1020 Alcohol dependence, uncomplicated: Secondary | ICD-10-CM | POA: Diagnosis not present

## 2016-01-05 DIAGNOSIS — Z808 Family history of malignant neoplasm of other organs or systems: Secondary | ICD-10-CM | POA: Diagnosis not present

## 2016-01-05 MED ORDER — ENSURE ENLIVE PO LIQD: 237.0000 mL | Freq: Two times a day (BID) | ORAL | Status: DC

## 2016-01-05 MED ORDER — LORAZEPAM 1 MG PO TABS: 1.0000 mg | ORAL_TABLET | Freq: Four times a day (QID) | ORAL | Status: DC | PRN

## 2016-01-05 MED ORDER — IBUPROFEN 600 MG PO TABS
600.0000 mg | ORAL_TABLET | Freq: Three times a day (TID) | ORAL | Status: DC | PRN
  Filled 2016-01-05: qty 10

## 2016-01-05 MED ORDER — ONDANSETRON HCL 4 MG PO TABS
4.0000 mg | ORAL_TABLET | Freq: Three times a day (TID) | ORAL | Status: DC | PRN
Start: 2016-01-05 — End: 2016-01-11
  Administered 2016-01-09: 4 mg via ORAL
  Filled 2016-01-05: qty 1

## 2016-01-05 MED ORDER — NICOTINE 21 MG/24HR TD PT24
21.0000 mg | MEDICATED_PATCH | Freq: Every day | TRANSDERMAL | Status: DC
  Administered 2016-01-06: 21 mg via TRANSDERMAL
  Filled 2016-01-05 (×3): qty 1
  Filled 2016-01-05: qty 14
  Filled 2016-01-05 (×3): qty 1

## 2016-01-05 MED ORDER — ZOLPIDEM TARTRATE 5 MG PO TABS
5.0000 mg | ORAL_TABLET | Freq: Every evening | ORAL | Status: DC | PRN
  Administered 2016-01-05 – 2016-01-07 (×3): 5 mg via ORAL
  Filled 2016-01-05 (×3): qty 1

## 2016-01-05 MED ORDER — ALUM & MAG HYDROXIDE-SIMETH 200-200-20 MG/5ML PO SUSP: 30.0000 mL | ORAL | Status: DC | PRN

## 2016-01-05 MED ORDER — FLUOXETINE HCL 20 MG PO CAPS
40.0000 mg | ORAL_CAPSULE | Freq: Every day | ORAL | Status: DC
  Administered 2016-01-06: 40 mg via ORAL
  Filled 2016-01-05 (×2): qty 2

## 2016-01-05 MED ORDER — MAGNESIUM HYDROXIDE 400 MG/5ML PO SUSP
30.0000 mL | Freq: Every day | ORAL | Status: DC | PRN
  Administered 2016-01-10: 30 mL via ORAL
  Filled 2016-01-05: qty 30

## 2016-01-05 MED ORDER — ACETAMINOPHEN 325 MG PO TABS
650.0000 mg | ORAL_TABLET | ORAL | Status: DC | PRN
  Administered 2016-01-08: 650 mg via ORAL
  Filled 2016-01-05: qty 2

## 2016-01-05 MED ORDER — BUPROPION HCL ER (SR) 150 MG PO TB12
300.0000 mg | ORAL_TABLET | Freq: Every day | ORAL | Status: DC
  Administered 2016-01-06 – 2016-01-10 (×5): 300 mg via ORAL
  Filled 2016-01-05 (×6): qty 2
  Filled 2016-01-05: qty 28

## 2016-01-05 NOTE — ED Notes (Signed)
Patient denies pain and is resting comfortably.  

## 2016-01-05 NOTE — BH Assessment (Signed)
[place] Assessment Progress Note  Per Corena Pilgrim, MD, this pt would benefit from psychiatric hospitalization at this time.  Letitia Libra, RN, [facility] has assigned pt to [facility] Rm [id number]; they will be ready to receive pt at 13:00.  Pt has signed Voluntary Admission and Consent for Treatment, as well as Consent to Release Information to no one, and signed forms have been faxed to [facility].  Pt's nurse, Narda Rutherford, has been notified, and agrees to send original paperwork along with pt via [facility], and to call report to [phone].  Jalene Mullet, [place] Triage Specialist [phone]

## 2016-01-05 NOTE — ED Notes (Signed)
Patient slept well throughout shift.

## 2016-01-05 NOTE — Progress Notes (Signed)
Edgar Mooney is a 50 year old male being admitted voluntarily [facility].  He presented to the ED reporting feeling really depressed and thoughts of suicide.  His plan was to OD.  He reported that he has been hearing voices of a "shadow man" that has been telling him to do more drugs.  He denies HI.  He reports that he has been drinking 18 beers per day, heroin on occasion and crack cocaine.  He denies current OP treatment and has never had any inpatient admissions.  He voiced symptoms of depression as irritability, weight loss, fatigue, hopelessness, worthlessness, trouble concentrating, low self-esteem, crying and excessive worry.  He has history of gout and Hepatitis C.  He denies pain or discomfort and appears to be in no physical distress.  He voiced that when he is discharged, he would like to go to a long term treatment program.  Oriented him to the unit.  Admission paperwork completed and signed.  Belongings searched and secured in locker # 41.  Skin assessment completed and noted tattoos on R/L shoulder and scar from cyst on upper left side of back.  Q 15 minute checks initiated for safety.  We will monitor the progress towards his goals.

## 2016-01-05 NOTE — Progress Notes (Signed)
Pt confirm with ED CM that the pcp listed in [place] was not his pcp  Cm removed this doctor Pt confirms he is homeless but will be in the [place]  Pt with a blank address except for city of [place] in [place] with no pcp.  CM discussed and provided written information to assist pt with determining choice for uninsured accepting pcps, discussed the importance of pcp vs EDP services for f/u care, www.needymeds.org, www.goodrx.com, discounted pharmacies and other [employer] such as [employer], affordable care act, financial assistance, uninsured dental services, [facility] med assist, DSS and  health department  Reviewed resources for [employer] uninsured accepting pcps like [facility], family medicine at [employer], [facility], [facility], local urgent care centers, [facility], [facility] family practice, general medical clinics, family services of the [place], [facility] urgent care plus others, medication resources, [facility] out patient pharmacies and housing Pt voiced understanding and appreciation of resources provided   Provided P4CC contact information Pt is not a candidate for [employer] program at this time since he states he will be here only for [employer], address city in [place] is [place]

## 2016-01-05 NOTE — ED Notes (Signed)
[facility] here to transport

## 2016-01-05 NOTE — ED Notes (Addendum)
Pt ambulatory w/o difficulty to [facility] w/ [facility].  Belongings given to driver 

## 2016-01-05 NOTE — Consult Note (Signed)
[place] [facility]iat[facility] Consult   Reason for Consult:  Suicidal and cocaine abuse Referring Physician:  EDP Patient Identification: Edgar Mooney MRN:  [id number] Principal Diagnosis: Major depressive disorder, recurrent severe without psychotic features [facility]) Diagnosis:   Patient Active Problem List   Diagnosis Date Noted  . Major depressive disorder, recurrent severe without psychotic features ([place]) [F33.2] 01/05/2016    Priority: High  . Cocaine abuse [F14.10] 01/05/2016    Priority: High  . Benzodiazepine abuse [F13.10] 01/05/2016    Priority: High  . Gout [M10.9] 07/06/2015  . Hep C w/o coma, chronic ([place]) [B18.2] 07/06/2015  . HTN (hypertension) [I10] 12/01/2014  . Elevated blood uric acid level [E79.0] 12/01/2014  . History of ETOH abuse [F10.10] 12/01/2014    Total Time spent with patient: 45 minutes  Subjective:   Edgar Mooney is a 50 y.o. male patient admitted with suicidal ideations with plan to overdose.  HPI: On admission:  50 y.o. male, who presents voluntarily and unaccompanied to [facility]. Pt reports being "really depressed." Pt reported he has been having thoughts of suicide. Pt reported: "What's the use of living."  Pt reported wanting to hurt/kill himself. Pt reported, he would probably overdose if he commits suicide. Pt reported two days ago he heard a man's voice telling him to do more drugs and "shadow man." Pt denies HI and self-harming behaviors. Pt reported experiencing the following depressive and anxiety symptoms: sadness/low mood, irritability, weight changes (pt reported loosing 50-60 lbs, over the span of 2-3 months), fatigue, feeling hopeless/worthless, difficulty concentrating, low self esteem, crying, excessive worry.   Pt denied verbal, physical and sexual abuse. Pt reported, drinking two, twenty four oz beers today at noon; pt reported drinking eighteen cans of beer per day. Pt reported using Heroin "about three days ago", pt reported using "a 20  bag." Pt reported using $100 worth of Cocaine last night. Pt denied seeing an psychiatrist and therapist. Pt denied being prescribed medication. Pt denied previous inpatient admissions. Pt reported previous rehabilitation admissions. Pt reported, he went [facility], in [place], [place] for about two years; he went to [facility] in [place], [place] in 2016 for ten months; and he went to [facility] in [place], [place] in 2017 for three months. Pt denied seeing an psychiatrist and therapist. Pt denied being prescribed medication. Pt denied previous inpatient admissions.  Today, he remains suicidal with plan to overdose.  Endorses hopelessness, worthlessness, and helplessness.    Past Psychiatric History: alcohol and drug abuse, depression  Risk to Self: Suicidal Ideation: Yes-Currently Present Suicidal Intent: Yes-Currently Present Is patient at risk for suicide?: Yes Suicidal Plan?: Yes-Currently Present Specify Current Suicidal Plan:  (Pt reported overdosing.) Access to Means: Yes Specify Access to Suicidal Means: Pt has access to drugs.  What has been your use of drugs/alcohol within the last 12 months?:  (Alcohol, 18 beers daily, Heroin 20 bag three days ago) How many times?:  (0) Other Self Harm Risks:  (NA) Triggers for Past Attempts: Other (Comment) (Pt reported doing drugs.) Intentional Self Injurious Behavior: None (Pt denies.) Risk to Others: Homicidal Ideation: No (Pt denies.) Thoughts of Harm to Others: No (Pt denies. ) Current Homicidal Intent: No Current Homicidal Plan: No (Pt denies. ) Access to Homicidal Means: No (Pt denies. ) Identified Victim:  (NA) History of harm to others?: No (Pt denies. ) Assessment of Violence: None Noted Violent Behavior Description:  (NA) Does patient have access to weapons?: No (Pt denies. ) Criminal Charges Pending?: No Does  patient have a court date: No Prior Inpatient Therapy: Prior Inpatient Therapy: Yes Prior Therapy Dates:  (2017, 2016, 2015) Prior  Therapy Facilty/Provider(s):  ([facility], [facility], [facility]. ) Reason for Treatment:  (Substance abuse treatment.) Prior Outpatient Therapy: Prior Outpatient Therapy: No Prior Therapy Dates: NA Prior Therapy Facilty/Provider(s):  (NA) Reason for Treatment: NA Does patient have an ACCT team?: No Does patient have Intensive In-House Services?  : No Does patient have [facility] services? : No Does patient have P4CC services?: No  Past Medical History:  Past Medical History:  Diagnosis Date  . Alcohol abuse   . Cocaine abuse   . Depression   . Gout   . Hep C w/o coma, chronic [facility]) diagnosed May 2016   History reviewed. No pertinent surgical history. Family History:  Family History  Problem Relation Age of Onset  . Cancer Mother   . Cancer Father    Family Psychiatric  History: none Social History:  History  Alcohol Use  . Yes    Comment: Soberity 01/24/12     History  Drug Use  . Types: Cocaine, IV    Comment: soberity 01/24/12    Social History   Social History  . Marital status: Single    Spouse name: N/A  . Number of children: N/A  . Years of education: N/A   Social History Main Topics  . Smoking status: Current Some Day Smoker    Packs/day: 1.00    Years: 25.00    Types: Cigarettes  . Smokeless tobacco: Never Used  . Alcohol use Yes     Comment: Soberity 01/24/12  . Drug use:     Types: Cocaine, IV     Comment: soberity 01/24/12  . Sexual activity: Not Currently   Other Topics Concern  . None   Social History Narrative  . None   Additional Social History:    Allergies:  No Known Allergies  Labs:  Results for orders placed or performed during the hospital encounter of 01/04/16 (from the past 48 hour(s))  Comprehensive metabolic panel     Status: Abnormal   Collection Time: 01/04/16  7:24 PM  Result Value Ref Range   Sodium 139 135 - 145 mmol/L   Potassium 3.9 3.5 - 5.1 mmol/L   Chloride 106 101 - 111 mmol/L   CO2 23 22 - 32 mmol/L    Glucose, Bld 101 (H) 65 - 99 mg/dL   BUN 12 6 - 20 mg/dL   Creatinine, Ser 0.89 0.61 - 1.24 mg/dL   Calcium 9.5 8.9 - 10.3 mg/dL   Total Protein 7.9 6.5 - 8.1 g/dL   Albumin 4.2 3.5 - 5.0 g/dL   AST 30 15 - 41 U/L   ALT 26 17 - 63 U/L   Alkaline Phosphatase 80 38 - 126 U/L   Total Bilirubin 2.6 (H) 0.3 - 1.2 mg/dL   GFR calc non Af Amer >60 >60 mL/min   GFR calc Af Amer >60 >60 mL/min    Comment: (NOTE) The eGFR has been calculated using the CKD EPI equation. This calculation has not been validated in all clinical situations. eGFR's persistently <60 mL/min signify possible Chronic Kidney Disease.    Anion gap 10 5 - 15  Ethanol     Status: None   Collection Time: 01/04/16  7:24 PM  Result Value Ref Range   Alcohol, Ethyl (B) <5 <5 mg/dL    Comment:        LOWEST DETECTABLE LIMIT FOR SERUM ALCOHOL  IS 5 mg/dL FOR MEDICAL PURPOSES ONLY   Salicylate level     Status: None   Collection Time: 01/04/16  7:24 PM  Result Value Ref Range   Salicylate Lvl <4.1 2.8 - 30.0 mg/dL  Acetaminophen level     Status: Abnormal   Collection Time: 01/04/16  7:24 PM  Result Value Ref Range   Acetaminophen (Tylenol), Serum <10 (L) 10 - 30 ug/mL    Comment:        THERAPEUTIC CONCENTRATIONS VARY SIGNIFICANTLY. A RANGE OF 10-30 ug/mL MAY BE AN EFFECTIVE CONCENTRATION FOR MANY PATIENTS. HOWEVER, SOME ARE BEST TREATED AT CONCENTRATIONS OUTSIDE THIS RANGE. ACETAMINOPHEN CONCENTRATIONS >150 ug/mL AT 4 HOURS AFTER INGESTION AND >50 ug/mL AT 12 HOURS AFTER INGESTION ARE OFTEN ASSOCIATED WITH TOXIC REACTIONS.   cbc     Status: Abnormal   Collection Time: 01/04/16  7:24 PM  Result Value Ref Range   WBC 6.4 4.0 - 10.5 K/uL   RBC 5.44 4.22 - 5.81 MIL/uL   Hemoglobin 16.9 13.0 - 17.0 g/dL   HCT 48.3 39.0 - 52.0 %   MCV 88.8 78.0 - 100.0 fL   MCH 31.1 26.0 - 34.0 pg   MCHC 35.0 30.0 - 36.0 g/dL   RDW 13.4 11.5 - 15.5 %   Platelets 121 (L) 150 - 400 K/uL  Rapid urine drug screen (hospital  performed)     Status: Abnormal   Collection Time: 01/04/16  8:32 PM  Result Value Ref Range   Opiates NONE DETECTED NONE DETECTED   Cocaine POSITIVE (A) NONE DETECTED   Benzodiazepines POSITIVE (A) NONE DETECTED   Amphetamines NONE DETECTED NONE DETECTED   Tetrahydrocannabinol POSITIVE (A) NONE DETECTED   Barbiturates NONE DETECTED NONE DETECTED    Comment:        DRUG SCREEN FOR MEDICAL PURPOSES ONLY.  IF CONFIRMATION IS NEEDED FOR ANY PURPOSE, NOTIFY LAB WITHIN 5 DAYS.        LOWEST DETECTABLE LIMITS FOR URINE DRUG SCREEN Drug Class       Cutoff (ng/mL) Amphetamine      1000 Barbiturate      200 Benzodiazepine   740 Tricyclics       814 Opiates          300 Cocaine          300 THC              50     Current Facility-Administered Medications  Medication Dose Route Frequency Provider Last Rate Last Dose  . acetaminophen (TYLENOL) tablet 650 mg  650 mg Oral Q4H PRN [name], PA-C      . alum & mag hydroxide-simeth (MAALOX/MYLANTA) 200-200-20 MG/5ML suspension 30 mL  30 mL Oral PRN [name], PA-C      . buPROPion [facility]) 12 hr tablet 300 mg  300 mg Oral Daily [name], PA-C   300 mg at 01/04/16 2141  . FLUoxetine (PROZAC) capsule 40 mg  40 mg Oral Daily [name], PA-C   40 mg at 01/04/16 2140  . ibuprofen (ADVIL,MOTRIN) tablet 600 mg  600 mg Oral Q8H PRN [name], PA-C      . LORazepam (ATIVAN) tablet 0-4 mg  0-4 mg Oral Q6H [name], PA-C   1 mg at 01/04/16 2200   Followed by  . [START ON 01/06/2016] LORazepam (ATIVAN) tablet 0-4 mg  0-4 mg Oral Q12H [name], PA-C      . LORazepam (  ATIVAN) tablet 1 mg  1 mg Oral Q8H PRN [name], PA-C      . nicotine (NICODERM CQ - dosed in mg/24 hours) patch 21 mg  21 mg Transdermal Daily [name], PA-C   21 mg at 01/04/16 2142  . ondansetron (ZOFRAN) tablet 4 mg  4 mg Oral Q8H PRN [name], PA-C      . thiamine (VITAMIN B-1) tablet 100 mg  100 mg Oral Daily [name], PA-C   100 mg at 01/04/16 2141   Or  . thiamine (B-1) injection 100 mg  100 mg Intravenous Daily [name], PA-C      . zolpidem [facility]) tablet 5 mg  5 mg Oral QHS PRN [name], PA-C       Current Outpatient Prescriptions  Medication Sig Dispense Refill  . ibuprofen (ADVIL,MOTRIN) 200 MG tablet Take 1,000 mg by mouth 3 (three) times daily as needed for headache or moderate pain.      Musculoskeletal: Strength & Muscle Tone: within normal limits Gait & Station: normal Patient leans: N/A  Psychiatric Specialty Exam: Physical Exam  Constitutional: He is oriented to person, place, and time. He appears well-developed and well-nourished.  HENT:  Head: Normocephalic.  Neck: Normal range of motion.  Respiratory: Effort normal.  Musculoskeletal: Normal range of motion.  Neurological: He is alert and oriented to person, place, and time.  Skin: Skin is warm and dry.  Psychiatric: His speech is normal and behavior is normal. Judgment normal. Cognition and memory are normal. He exhibits a depressed mood. He expresses suicidal ideation. He expresses suicidal plans.    Review of Systems  Constitutional: Negative.   HENT: Negative.   Eyes: Negative.   Respiratory: Negative.   Cardiovascular: Negative.   Gastrointestinal: Negative.   Genitourinary: Negative.   Musculoskeletal: Negative.   Skin: Negative.   Neurological: Negative.   Endo/Heme/Allergies: Negative.   Psychiatric/Behavioral: Positive for depression, substance abuse and suicidal ideas.    Blood pressure 122/82, pulse 65, temperature 97.9 F (36.6 C), temperature source Oral, resp. rate 20, height _0  (1.575 m), weight 63.5 kg (140 lb), SpO2 96 %.Body mass index is 25.61 kg/m.  General Appearance: Disheveled  Eye Contact:  Fair  Speech:  Normal Rate  Volume:  Decreased  Mood:  Depressed  Affect:  Congruent  Thought Process:  Coherent and Descriptions of Associations: Intact  Orientation:  Full  (Time, Place, and Person)  Thought Content:  Rumination  Suicidal Thoughts:  Yes.  with intent/plan  Homicidal Thoughts:  No  Memory:  Immediate;   Fair Recent;   Fair Remote;   Fair  Judgement:  Poor  Insight:  Fair  Psychomotor Activity:  Decreased  Concentration:  Concentration: Fair and Attention Span: Fair  Recall:  [employer] of Knowledge:  Fair  Language:  Good  Akathisia:  No  Handed:  Right  AIMS (if indicated):     Assets:  Leisure Time Physical Health Resilience  ADL's:  Intact  Cognition:  WNL  Sleep:        Treatment Plan Summary: Daily contact with patient to assess and evaluate symptoms and progress in treatment, Medication management and Plan major depressive disorder, recurrent, severe with psychosis:  -Crisis stabilization -Medication management:  Continue Prozac 40 mg daily for depression and Wellbutrin 300 mg daily for depression.  Started Ativan 1 mg every six hours PRN withdrawal symptoms. -Individual and substance abuse counseling  Disposition: Recommend psychiatric Inpatient  admission when medically cleared.  Waylan Boga, NP 01/05/2016 10:14 AM  Patient seen face-to-face for psychiatric evaluation, chart reviewed and case discussed with the physician extender and developed treatment plan. Reviewed the information documented and agree with the treatment plan. Corena Pilgrim, MD

## 2016-01-05 NOTE — ED Notes (Signed)
Patient A/O, no noted distress. Denies pain/SI/HI/AVH. Patient stated that "drinking and drugs" brought him to the ED. Last drink was noon and last use of cocaine was last night. At the most point, he states he want to rest. Staff will continue to monitor, maintained safety, and meet needs.

## 2016-01-05 NOTE — Progress Notes (Signed)
01/05/16 1357:  LRT went to offer pt activities, pt refused.  Victorino Sparrow, LRT/CTRS

## 2016-01-05 NOTE — ED Notes (Signed)
[facility] contacted for transport, pt up to the bathroom to shower

## 2016-01-05 NOTE — ED Notes (Signed)
Bed: [facility] Expected date:  Expected time:  Means of arrival:  Comments: Triage 3

## 2016-01-05 NOTE — ED Notes (Signed)
Dr Loni Muse and Theodoro Clock dnp into see

## 2016-01-05 NOTE — Tx Team (Signed)
Initial Treatment Plan 01/05/2016 3:21 PM [name] [id number]    PATIENT STRESSORS: Financial difficulties Marital or family conflict Substance abuse   PATIENT STRENGTHS: Communication skills Motivation for treatment/growth Physical Health   PATIENT IDENTIFIED PROBLEMS: Depression  Anxiety  Substance abuse  Suicidal ideation  "I would like to go to a long term program"  "Not feel suicidal"           DISCHARGE CRITERIA:  Improved stabilization in mood, thinking, and/or behavior Verbal commitment to aftercare and medication compliance Withdrawal symptoms are absent or subacute and managed without 24-hour nursing intervention  PRELIMINARY DISCHARGE PLAN: Outpatient therapy Medication management  PATIENT/FAMILY INVOLVEMENT: This treatment plan has been presented to and reviewed with the patient, Edgar Mooney.  The patient and family have been given the opportunity to ask questions and make suggestions.  Windell Moment, RN 01/05/2016, 3:21 PM

## 2016-01-06 DIAGNOSIS — R45851 Suicidal ideations: Secondary | ICD-10-CM

## 2016-01-06 DIAGNOSIS — F1020 Alcohol dependence, uncomplicated: Secondary | ICD-10-CM

## 2016-01-06 DIAGNOSIS — Z808 Family history of malignant neoplasm of other organs or systems: Secondary | ICD-10-CM

## 2016-01-06 DIAGNOSIS — Z79899 Other long term (current) drug therapy: Secondary | ICD-10-CM

## 2016-01-06 MED ORDER — ENSURE ENLIVE PO LIQD
237.0000 mL | Freq: Two times a day (BID) | ORAL | Status: DC | PRN
  Administered 2016-01-08 – 2016-01-09 (×2): 237 mL via ORAL
  Filled 2016-01-06 (×2): qty 237

## 2016-01-06 NOTE — Progress Notes (Signed)
Patient ID: Edgar Mooney, male   DOB: 01/05/1966, 50 y.o.   MRN: [id number] D: client visible on the unit, in dayroom. No concerns expressed at this time. A: Writer reviewed medications, administered as ordered. Staff will monitor q36min for safety. R: client is safe on the unit, attended karaoke.

## 2016-01-06 NOTE — [facility] Suicide Risk Assessment (Signed)
[facility] Admission Suicide Risk Assessment   Nursing information obtained from:  Patient Demographic factors:  Male, Caucasian, Low socioeconomic status, Unemployed Current Mental Status:  Suicidal ideation indicated by patient Loss Factors:  Financial problems / change in socioeconomic status Historical Factors:  Family history of suicide Risk Reduction Factors:  NA  Total Time spent with patient: 1 hour Principal Problem: <principal problem not specified> Diagnosis:   Patient Active Problem List   Diagnosis Date Noted  . Major depressive disorder, recurrent severe without psychotic features ([place]) [F33.2] 01/05/2016  . Cocaine abuse [F14.10] 01/05/2016  . Benzodiazepine abuse [F13.10] 01/05/2016  . Major depressive disorder, recurrent episode, severe, with psychosis ([place]) [F33.3] 01/05/2016  . Gout [M10.9] 07/06/2015  . Hep C w/o coma, chronic ([place]) [B18.2] 07/06/2015  . HTN (hypertension) [I10] 12/01/2014  . Elevated blood uric acid level [E79.0] 12/01/2014  . History of ETOH abuse [F10.10] 12/01/2014   Subjective Data: endorses chronic si, hopelessness, denies plan to act now  Continued Clinical Symptoms:  Alcohol Use Disorder Identification Test Final Score (AUDIT): 29 The "Alcohol Use Disorders Identification Test", Guidelines for Use in Primary Care, Second Edition.  World [facility]). Score between 0-7:  no or low risk or alcohol related problems. Score between 8-15:  moderate risk of alcohol related problems. Score between 16-19:  high risk of alcohol related problems. Score 20 or above:  warrants further diagnostic evaluation for alcohol dependence and treatment.   CLINICAL FACTORS:   Depression:   Anhedonia Comorbid alcohol abuse/dependence Hopelessness   Musculoskeletal: Strength & Muscle Tone: within normal limits Gait & Station: normal Patient leans: N/A  Psychiatric Specialty Exam: Physical Exam  ROS  Blood pressure 139/75, pulse 62,  temperature 98.1 F (36.7 C), temperature source Oral, resp. rate 18, height 5\' 2"  (1.575 m), weight 63.7 kg (140 lb 8 oz), SpO2 96 %.Body mass index is 25.7 kg/m.  General Appearance: Casual  Eye Contact:  Good  Speech:  Normal Rate  Volume:  Normal  Mood:  Depressed  Affect:  Congruent  Thought Process:  Goal Directed  Orientation:  Negative  Thought Content:  Negative  Suicidal Thoughts:  Yes.  without intent/plan  Homicidal Thoughts:  No  Memory:  Negative  Judgement:  Fair  Insight:  Fair  Psychomotor Activity:  Normal  Concentration:  Concentration: Fair and Attention Span: Fair  Recall:  Good  Fund of Knowledge:  Good  Language:  Negative  Akathisia:  Negative  Handed:  Right  AIMS (if indicated):     Assets:  Desire for Improvement Resilience  ADL's:  Intact  Cognition:  WNL  Sleep:  Number of Hours: 6.75      COGNITIVE FEATURES THAT CONTRIBUTE TO RISK:  Thought constriction (tunnel vision)    SUICIDE RISK:   Moderate while in inpatient environment Frequent suicidal ideation with limited intensity, and duration, some specificity in terms of plans, no associated intent, good self-control, limited dysphoria/symptomatology, some risk factors present, and identifiable protective factors, including available and accessible social support.Risk would be elevated without support on inpatient environment as pt actively using substances, and feels hopeless, guilty and overwhelmed.  PLAN OF CARE: continue inpatient stay with monitoring for safety with q15 minute checks, medication and psychotherapeutic treatment.  I certify that inpatient services furnished can reasonably be expected to improve the patient's condition.  Linard Millers, MD 01/06/2016, 11:43 AM

## 2016-01-06 NOTE — [facility] Group Notes (Signed)
Patient did not attend group.

## 2016-01-06 NOTE — Tx Team (Signed)
Interdisciplinary Treatment and Diagnostic Plan Update  01/06/2016 Time of Session: 9:30am Edgar Mooney MRN: [id number]  Principal Diagnosis: Major depressive disorder, recurrent episode, severe, with psychosis ([place])  Current Medications:  Current Facility-Administered Medications  Medication Dose Route Frequency Provider Last Rate Last Dose  . acetaminophen (TYLENOL) tablet 650 mg  650 mg Oral Q4H PRN [name], NP      . alum & mag hydroxide-simeth (MAALOX/MYLANTA) 200-200-20 MG/5ML suspension 30 mL  30 mL Oral PRN [name], NP      . buPROPion [facility] SR) 12 hr tablet 300 mg  300 mg Oral Daily [name], NP   300 mg at 01/06/16 0844  . feeding supplement (ENSURE ENLIVE) (ENSURE ENLIVE) liquid 237 mL  237 mL Oral BID BM Fernando A Cobos, MD      . FLUoxetine (PROZAC) capsule 40 mg  40 mg Oral Daily [name], NP   40 mg at 01/06/16 0844  . ibuprofen (ADVIL,MOTRIN) tablet 600 mg  600 mg Oral Q8H PRN [name], NP      . LORazepam (ATIVAN) tablet 1 mg  1 mg Oral Q6H PRN [name], NP      . magnesium hydroxide (MILK OF MAGNESIA) suspension 30 mL  30 mL Oral Daily PRN [name], NP      . nicotine (NICODERM CQ - dosed in mg/24 hours) patch 21 mg  21 mg Transdermal Daily [name], NP   21 mg at 01/06/16 0843  . ondansetron (ZOFRAN) tablet 4 mg  4 mg Oral Q8H PRN [name], NP      . zolpidem (AMBIEN) tablet 5 mg  5 mg Oral QHS PRN [name], NP   5 mg at 01/05/16 2150   PTA Medications: Prescriptions Prior to Admission  Medication Sig Dispense Refill Last Dose  . ibuprofen (ADVIL,MOTRIN) 200 MG tablet Take 1,000 mg by mouth 3 (three) times daily as needed for headache or moderate pain.   Past Week at Unknown time    Treatment Modalities: Medication Management, Group therapy, Case management,  1 to 1 session with clinician, Psychoeducation, Recreational therapy.   Physician Treatment Plan for Primary Diagnosis: Major depressive  disorder, recurrent episode, severe, with psychosis ([place]) Long Term Goal(s): Improvement in symptoms so as ready for discharge   Short Term Goals: Ability to identify changes in lifestyle to reduce recurrence of condition will improve, Ability to identify and develop effective coping behaviors will improve and Compliance with prescribed medications will improve  Medication Management: Evaluate patient's response, side effects, and tolerance of medication regimen.  Therapeutic Interventions: 1 to 1 sessions, Unit Group sessions and Medication administration.  Evaluation of Outcomes: Not Met   RN Treatment Plan for Primary Diagnosis: Major depressive disorder, recurrent episode, severe, with psychosis ([place]) Long Term Goal(s): Knowledge of disease and therapeutic regimen to maintain health will improve  Short Term Goals: Ability to remain free from injury will improve, Ability to identify and develop effective coping behaviors will improve and Compliance with prescribed medications will improve  Medication Management: RN will administer medications as ordered by provider, will assess and evaluate patient's response and provide education to patient for prescribed medication. RN will report any adverse and/or side effects to prescribing provider.  Therapeutic Interventions: 1 on 1 counseling sessions, Psychoeducation, Medication administration, Evaluate responses to treatment, Monitor vital signs and CBGs as ordered, Perform/monitor CIWA, COWS, AIMS and Fall Risk screenings as ordered, Perform wound care treatments  as ordered.  Evaluation of Outcomes: Not Met   LCSW Treatment Plan for Primary Diagnosis: Major depressive disorder, recurrent episode, severe, with psychosis ([place]) Long Term Goal(s): Safe transition to appropriate next level of care at discharge, Engage patient in therapeutic group addressing interpersonal concerns.  Short Term Goals: Engage patient in aftercare planning with  referrals and resources, Increase emotional regulation, Identify triggers associated with mental health/substance abuse issues and Increase skills for wellness and recovery  Therapeutic Interventions: Assess for all discharge needs, 1 to 1 time with Social worker, Explore available resources and support systems, Assess for adequacy in community support network, Educate family and significant other(s) on suicide prevention, Complete Psychosocial Assessment, Interpersonal group therapy.  Evaluation of Outcomes: Not Met   Progress in Treatment :  Attending groups: Continuing to assess  Participating in groups: Continuing to assess  Taking medication as prescribed: Yes, MD continuing to assess for appropriate medication regimen  Toleration medication: Yes  Family/Significant other contact made: No patient declines collateral contacts  Patient understands diagnosis: Yes  Discussing patient identified problems/goals with staff: Yes  Medical problems stabilized or resolved: Yes  Denies suicidal/homicidal ideation: Treatment team continuing to asses  Issues/concerns per patient self-inventory: None reported  Other: N/A  New problem(s) identified: None reported at this time    New Short Term/Long Term Goal(s): None at this time    Discharge Plan or Barriers: Patient interested in residential treatment.    Reason for Continuation of Hospitalization: Anxiety Depression Medication stabilization Suicidal Ideations Withdrawal symptoms  Estimated Length of Stay: 3-5 days    Attendees:  Patient:   Physician: Dr.Oates , MD  01/06/2016   9:30am  Nursing: Clement Husbands, RN9/22/2017 9:30am  RN Care Manager: Lars Pinks, CM  01/06/2016 9:30am  Social Workers: Peri Maris, LCSW, [place], LCSW, 01/06/2016 9:30am  Nurse Pratictioners: Samuel Jester, NP, Lindell Spar, NP 01/06/2016 9:30am  Other:  01/06/2016 9:30am    Scribe for Treatment Team: Tilden Fossa, [place] Worker [facility] [phone]

## 2016-01-06 NOTE — Progress Notes (Signed)
Recreation Therapy Notes  Date: 01/06/16 Time: 0930 Location: [address] Hall Group Room  Group Topic: Stress Management  Goal Area(s) Addresses:  Patient will verbalize importance of using healthy stress management.  Patient will identify positive emotions associated with healthy stress management.   Behavioral Response: Engaged  Intervention: Stress Management  Activity :  [facility].  LRT introduced the concept of guided imagery.  LRT read a script to allow patients to participate in activity.  Patients were to follow along as LRT read script.  Education:  Stress Management, Discharge Planning.   Education Outcome: Acknowledges edcuation/In group clarification offered/Needs additional education  Clinical Observations/Feedback:  Pt attended group.   Victorino Sparrow, LRT/CTRS         Victorino Sparrow A 01/06/2016 12:40 PM

## 2016-01-06 NOTE — Progress Notes (Signed)
Patient ID: Edgar Mooney, male   DOB: 27-Mar-1966, 50 y.o.   MRN: [id number]  DAR: Pt. Denies SI/HI and A/V Hallucinations but reports he is able to contract for safety if feeling unsafe. He reports sleep is poor, energy level is low, concentration is poor and appetite is fair. He rates depression, anxiety, and hopelessness 9/10. Patient does not report any pain or discomfort at this time. Support and encouragement provided to the patient. Scheduled medications administered to patient per physician's orders. Patient is minimal but cooperative. He is seen in the milieu intermittently. He reports his goal is to get into long term treatment. Q15 minute checks are maintained for safety.

## 2016-01-06 NOTE — Progress Notes (Signed)
NUTRITION ASSESSMENT  Pt identified as at risk on the Malnutrition Screen Tool  INTERVENTION: 1. Educated patient on the importance of nutrition and encouraged intake of food and beverages. 2. Discussed weight goals. 3. Supplements: Ensure Enlive BID PRN, each supplement provides 350 kcal and 20 grams of protein.   NUTRITION DIAGNOSIS: Unintentional weight loss related to sub-optimal intake as evidenced by pt report.   Goal: Pt to meet >/= 90% of their estimated nutrition needs.  Monitor:  PO intake  Assessment:  Pt admitted for detox (alcohol and cocaine) and SI without a plan.  Per review, weight trending up from June 2016-March 2017 and then subsequent 45 lb weight loss (24% body weight) since March which is significant for time frame.   Ensure Enlive currently ordered BID; will change to BID PRN. Encourage PO intakes of meals and snacks and offer Ensure Enlive as desired.   49 y.o. male  Height: Ht Readings from Last 1 Encounters:  01/05/16 5\' 2"  (1.575 m)    Weight: Wt Readings from Last 1 Encounters:  01/05/16 140 lb 8 oz (63.7 kg)    Weight Hx: Wt Readings from Last 10 Encounters:  01/05/16 140 lb 8 oz (63.7 kg)  01/04/16 140 lb (63.5 kg)  07/06/15 185 lb (83.9 kg)  10/01/14 177 lb (80.3 kg)  09/07/14 165 lb (74.8 kg)  04/25/12 180 lb (81.6 kg)  02/13/12 170 lb (77.1 kg)  01/09/12 154 lb (69.9 kg)    BMI:  Body mass index is 25.7 kg/m. Pt meets criteria for overweight based on current BMI.  Estimated Nutritional Needs: Kcal: 25-30 kcal/kg Protein: > 1 gram protein/kg Fluid: 1 ml/kcal  Diet Order: Diet Heart Room service appropriate? Yes; Fluid consistency: Thin Pt is also offered choice of unit snacks mid-morning and mid-afternoon.  Pt is eating as desired.   Lab results and medications reviewed.     Jarome Matin, MS, RD, LDN Inpatient Clinical Dietitian Pager # [phone] After hours/weekend pager # [phone]

## 2016-01-06 NOTE — H&P (Signed)
Psychiatric Admission Assessment Adult  Patient Identification: Edgar Mooney MRN:  [id number] Date of Evaluation:  01/06/2016 Chief Complaint:  MDD,RECURRENT SEVERE COCAINE USE DISORDER,MODERATE ALCOHOL USE DISORDER,SEVERE Principal Diagnosis: <principal problem not specified> Diagnosis:   Patient Active Problem List   Diagnosis Date Noted  . Major depressive disorder, recurrent severe without psychotic features ([place]) [F33.2] 01/05/2016  . Cocaine abuse [F14.10] 01/05/2016  . Benzodiazepine abuse [F13.10] 01/05/2016  . Major depressive disorder, recurrent episode, severe, with psychosis ([place]) [F33.3] 01/05/2016  . Gout [M10.9] 07/06/2015  . Hep C w/o coma, chronic ([place]) [B18.2] 07/06/2015  . HTN (hypertension) [I10] 12/01/2014  . Elevated blood uric acid level [E79.0] 12/01/2014  . History of ETOH abuse [F10.10] 12/01/2014   History of Present Illness: Mr Edgar Mooney reports that he has a long history of substance use for alcohol and cocaine and also depression. He states that currently cocaine is his primary drug and he smokes, snorts and uses I.V. He endorses using more and more, unable to cut down, giving up things for use and use despite consequences. For alcohol he endorses tolerance, unable to cut down/stop., use despite knowing it exacerbates his gout, giving up things for use. For depression, which he feels is not completely substance induced, he endorses depressed mood, anhedonia, hopelessness, and suicidal ideations. Denies psychosis, denies mania. Denies homicidal ideations. Endorses some chronic suicidal ideations, and still feels he might be better off dead and feeling overwhelmed by the idea of continuing on with his life--he denies current plan to act however. [name] denies any history of alcohol withdrawal, and denies any current withdrawal symptoms. He smokes about a PPD of cigarettes and would like to quit, and will smoke marijuana recreationally if offered. He also reports  intermittent use of heroin. Associated Signs/Symptoms: Depression Symptoms:  depressed mood, anhedonia, feelings of worthlessness/guilt, hopelessness, recurrent thoughts of death, suicidal thoughts without plan, (Hypo) Manic Symptoms:  none Anxiety Symptoms:  Excessive Worry, Psychotic Symptoms:  none PTSD Symptoms: Negative Total Time spent with patient: 45 minutes  Past Psychiatric History: [name] states that he has not been hospitalized prior for psychiatric reasons, but has attended numerous detox and rehab programs. He was diagnosed with ADHD as a child and took ritalin from third to eighth grade. He denies being in current psychiatric outpatient follow up and states he has not taken any psychiatric medications for at least a few months. Outpatient f/u has been intermittent.  Is the patient at risk to self? Yes.    Has the patient been a risk to self in the past 6 months? Yes.    Has the patient been a risk to self within the distant past? Yes.    Is the patient a risk to others? No.  Has the patient been a risk to others in the past 6 months? No.  Has the patient been a risk to others within the distant past? No.   Prior Inpatient Therapy:  see above Prior Outpatient Therapy:  see above  Alcohol Screening: 1. How often do you have a drink containing alcohol?: 4 or more times a week 2. How many drinks containing alcohol do you have on a typical day when you are drinking?: 10 or more 3. How often do you have six or more drinks on one occasion?: Daily or almost daily Preliminary Score: 8 4. How often during the last year have you found that you were not able to stop drinking once you had started?: Daily or almost daily 5. How  often during the last year have you failed to do what was normally expected from you becasue of drinking?: Weekly 6. How often during the last year have you needed a first drink in the morning to get yourself going after a heavy drinking session?:  Never 7. How often during the last year have you had a feeling of guilt of remorse after drinking?: Weekly 8. How often during the last year have you been unable to remember what happened the night before because you had been drinking?: Weekly 9. Have you or someone else been injured as a result of your drinking?: No 10. Has a relative or friend or a doctor or another health worker been concerned about your drinking or suggested you cut down?: Yes, during the last year Alcohol Use Disorder Identification Test Final Score (AUDIT): 29 Brief Intervention: Yes Substance Abuse History in the last 12 months:  Yes.   Consequences of Substance Abuse: Medical Consequences:  gout exacerbation, hepatitis C Family Consequences:  estranged from children Previous Psychotropic Medications: Yes  Psychological Evaluations: Yes  Past Medical History:  Past Medical History:  Diagnosis Date  . Alcohol abuse   . Cocaine abuse   . Depression   . Gout   . Hep C w/o coma, chronic [facility]) diagnosed May 2016   History reviewed. No pertinent surgical history. Family History:  Family History  Problem Relation Age of Onset  . Cancer Mother   . Cancer Father    Family Psychiatric  History: maternal family history depression and alcohol, maternal uncle and nephew committed suicide, alcohol use disorder-father, mother Tobacco Screening: Have you used any form of tobacco in the last 30 days? (Cigarettes, Smokeless Tobacco, Cigars, and/or Pipes): Yes Tobacco use, Select all that apply: 5 or more cigarettes per day Are you interested in Tobacco Cessation Medications?: Yes, will notify MD for an order Counseled patient on smoking cessation including recognizing danger situations, developing coping skills and basic information about quitting provided: Refused/Declined practical counseling Social History:  History  Alcohol Use  . 75.6 oz/week  . 126 Cans of beer per week     History  Drug Use  . Types: Cocaine, IV,  Heroin, Marijuana    Comment: soberity 01/24/12    Additional Social History: two children, daughter age 48 and son age 9, little current contact.      Pain Medications: Pt denies.  Prescriptions: Pt denies.  Over the Counter: Pt denies.  History of alcohol / drug use?: Yes Name of Substance 1: Alcohol 1 - Age of First Use: Pt reported: "[age]."  1 - Amount (size/oz): 18 beers per day 1 - Frequency: daily 1 - Duration: 3-4 weeks steady 1 - Last Use / Amount: 01/04/16 Name of Substance 2: Heroin 2 - Age of First Use: Pt reported: "[age]."  2 - Amount (size/oz): Pt reported: "a twenty bag."  2 - Frequency: 1-2 times per month 2 - Duration: 2 months 2 - Last Use / Amount: about a week ago Name of Substance 3: Cocaine/Crack 3 - Age of First Use: Pt reported: "[age]."  3 - Amount (size/oz): Pt reported: $100 worth."  3 - Frequency: Pt reported he used last night, (01/03/16). 3 - Duration: last 3-4 months 3 - Last Use / Amount: 01/03/16              Allergies:  No Known Allergies Lab Results:  Results for orders placed or performed during the hospital encounter of 01/04/16 (from the past 48 hour(s))  Comprehensive metabolic panel     Status: Abnormal   Collection Time: 01/04/16  7:24 PM  Result Value Ref Range   Sodium 139 135 - 145 mmol/L   Potassium 3.9 3.5 - 5.1 mmol/L   Chloride 106 101 - 111 mmol/L   CO2 23 22 - 32 mmol/L   Glucose, Bld 101 (H) 65 - 99 mg/dL   BUN 12 6 - 20 mg/dL   Creatinine, Ser 0.89 0.61 - 1.24 mg/dL   Calcium 9.5 8.9 - 10.3 mg/dL   Total Protein 7.9 6.5 - 8.1 g/dL   Albumin 4.2 3.5 - 5.0 g/dL   AST 30 15 - 41 U/L   ALT 26 17 - 63 U/L   Alkaline Phosphatase 80 38 - 126 U/L   Total Bilirubin 2.6 (H) 0.3 - 1.2 mg/dL   GFR calc non Af Amer >60 >60 mL/min   GFR calc Af Amer >60 >60 mL/min    Comment: (NOTE) The eGFR has been calculated using the CKD EPI equation. This calculation has not been validated in all clinical  situations. eGFR's persistently <60 mL/min signify possible Chronic Kidney Disease.    Anion gap 10 5 - 15  Ethanol     Status: None   Collection Time: 01/04/16  7:24 PM  Result Value Ref Range   Alcohol, Ethyl (B) <5 <5 mg/dL    Comment:        LOWEST DETECTABLE LIMIT FOR SERUM ALCOHOL IS 5 mg/dL FOR MEDICAL PURPOSES ONLY   Salicylate level     Status: None   Collection Time: 01/04/16  7:24 PM  Result Value Ref Range   Salicylate Lvl <5.2 2.8 - 30.0 mg/dL  Acetaminophen level     Status: Abnormal   Collection Time: 01/04/16  7:24 PM  Result Value Ref Range   Acetaminophen (Tylenol), Serum <10 (L) 10 - 30 ug/mL    Comment:        THERAPEUTIC CONCENTRATIONS VARY SIGNIFICANTLY. A RANGE OF 10-30 ug/mL MAY BE AN EFFECTIVE CONCENTRATION FOR MANY PATIENTS. HOWEVER, SOME ARE BEST TREATED AT CONCENTRATIONS OUTSIDE THIS RANGE. ACETAMINOPHEN CONCENTRATIONS >150 ug/mL AT 4 HOURS AFTER INGESTION AND >50 ug/mL AT 12 HOURS AFTER INGESTION ARE OFTEN ASSOCIATED WITH TOXIC REACTIONS.   cbc     Status: Abnormal   Collection Time: 01/04/16  7:24 PM  Result Value Ref Range   WBC 6.4 4.0 - 10.5 K/uL   RBC 5.44 4.22 - 5.81 MIL/uL   Hemoglobin 16.9 13.0 - 17.0 g/dL   HCT 48.3 39.0 - 52.0 %   MCV 88.8 78.0 - 100.0 fL   MCH 31.1 26.0 - 34.0 pg   MCHC 35.0 30.0 - 36.0 g/dL   RDW 13.4 11.5 - 15.5 %   Platelets 121 (L) 150 - 400 K/uL  Rapid urine drug screen (hospital performed)     Status: Abnormal   Collection Time: 01/04/16  8:32 PM  Result Value Ref Range   Opiates NONE DETECTED NONE DETECTED   Cocaine POSITIVE (A) NONE DETECTED   Benzodiazepines POSITIVE (A) NONE DETECTED   Amphetamines NONE DETECTED NONE DETECTED   Tetrahydrocannabinol POSITIVE (A) NONE DETECTED   Barbiturates NONE DETECTED NONE DETECTED    Comment:        DRUG SCREEN FOR MEDICAL PURPOSES ONLY.  IF CONFIRMATION IS NEEDED FOR ANY PURPOSE, NOTIFY LAB WITHIN 5 DAYS.        LOWEST DETECTABLE LIMITS FOR URINE  DRUG SCREEN Drug Class       Cutoff (  ng/mL) Amphetamine      1000 Barbiturate      200 Benzodiazepine   371 Tricyclics       696 Opiates          300 Cocaine          300 THC              50     Blood Alcohol level:  Lab Results  Component Value Date   ETH <5 01/04/2016   ETH 34 (H) 78/93/8101    Metabolic Disorder Labs:  No results found for: HGBA1C, MPG No results found for: PROLACTIN No results found for: CHOL, TRIG, HDL, CHOLHDL, VLDL, LDLCALC  Current Medications: Current Facility-Administered Medications  Medication Dose Route Frequency Provider Last Rate Last Dose  . acetaminophen (TYLENOL) tablet 650 mg  650 mg Oral Q4H PRN [name], NP      . alum & mag hydroxide-simeth (MAALOX/MYLANTA) 200-200-20 MG/5ML suspension 30 mL  30 mL Oral PRN [name], NP      . buPROPion [facility] SR) 12 hr tablet 300 mg  300 mg Oral Daily [name], NP   300 mg at 01/06/16 0844  . feeding supplement (ENSURE ENLIVE) (ENSURE ENLIVE) liquid 237 mL  237 mL Oral BID PRN Jenne Campus, MD      . ibuprofen (ADVIL,MOTRIN) tablet 600 mg  600 mg Oral Q8H PRN [name], NP      . magnesium hydroxide (MILK OF MAGNESIA) suspension 30 mL  30 mL Oral Daily PRN [name], NP      . nicotine (NICODERM CQ - dosed in mg/24 hours) patch 21 mg  21 mg Transdermal Daily [name], NP   21 mg at 01/06/16 0843  . ondansetron (ZOFRAN) tablet 4 mg  4 mg Oral Q8H PRN [name], NP      . zolpidem (AMBIEN) tablet 5 mg  5 mg Oral QHS PRN [name], NP   5 mg at 01/05/16 2150   PTA Medications: Prescriptions Prior to Admission  Medication Sig Dispense Refill Last Dose  . ibuprofen (ADVIL,MOTRIN) 200 MG tablet Take 1,000 mg by mouth 3 (three) times daily as needed for headache or moderate pain.   Past Week at Unknown time    Musculoskeletal: Strength & Muscle Tone: within normal limits Gait & Station: normal Patient leans: N/A  Psychiatric Specialty Exam: Physical  Exam  Constitutional: He is oriented to person, place, and time. He appears well-developed and well-nourished.  HENT:  Head: Normocephalic and atraumatic.  Eyes: Conjunctivae and EOM are normal. Pupils are equal, round, and reactive to light.  Neck: Normal range of motion.  Respiratory: Effort normal.  Musculoskeletal: Normal range of motion.  Neurological: He is alert and oriented to person, place, and time.  Skin: Skin is warm and dry.  Psychiatric: His behavior is normal.    Review of Systems  Musculoskeletal: Positive for joint pain.    Blood pressure 139/75, pulse 62, temperature 98.1 F (36.7 C), temperature source Oral, resp. rate 18, height '5\' 2"'$  (1.575 m), weight 63.7 kg (140 lb 8 oz), SpO2 96 %.Body mass index is 25.7 kg/m.  General Appearance: Fairly Groomed  Eye Contact:  Good  Speech:  Normal Rate  Volume:  Normal  Mood:  Depressed  Affect:  Congruent and Labile  Thought Process:  Goal Directed  Orientation:  Negative  Thought Content:  Negative  Suicidal Thoughts:  Yes.  without intent/plan  Homicidal Thoughts:  No  Memory:  Immediate;   Good Recent;   Good Remote;   Good  Judgement:  Fair  Insight:  Fair  Psychomotor Activity:  Normal  Concentration:  Concentration: Good and Attention Span: Good  Recall:  Good  Fund of Knowledge:  Good  Language:  Good  Akathisia:  Negative  Handed:  Right  AIMS (if indicated):     Assets:  Communication Skills Desire for Improvement Resilience  ADL's:  Intact  Cognition:  WNL  Sleep:  Number of Hours: 6.75   Vitals:   01/06/16 0630 01/06/16 0631  BP: (!) 145/84 139/75  Pulse: (!) 57 62  Resp: 18   Temp: 98.1 F (36.7 C)     Treatment Plan Summary: Daily contact with patient to assess and evaluate symptoms and progress in treatment, Medication management and patient is interested in referral for longer term substance treatment which is apporpriate. No withdrawal endorsed or observed and will d/c prn ativan. Pt  reprorts not taking any psychiatry medications for several months PTA, will d/c prozac and continue wellbutrin as combination of both could iincrease seizure risk and polypharmacy not clinically indicated at this time.   Observation Level/Precautions:  15 minute checks  Laboratory:  see lab report  Psychotherapy:  Milieu, 1:1 group  Medications:  See MAR  Consultations:  None at this time  Discharge Concerns:  See note  Estimated LOS: 3-7 days  Other:     Physician Treatment Plan for Primary Diagnosis: <principal problem not specified> Long Term Goal(s): Improvement in symptoms so as ready for discharge  Short Term Goals: Ability to disclose and discuss suicidal ideas, Ability to identify and develop effective coping behaviors will improve and Compliance with prescribed medications will improve  Physician Treatment Plan for Secondary Diagnosis: Active Problems:   Major depressive disorder, recurrent episode, severe, with psychosis ([place])  Long Term Goal(s): Improvement in symptoms so as ready for discharge  Short Term Goals: Ability to identify changes in lifestyle to reduce recurrence of condition will improve, Ability to verbalize feelings will improve, Ability to identify and develop effective coping behaviors will improve and Ability to identify triggers associated with substance abuse/mental health issues will improve  I certify that inpatient services furnished can reasonably be expected to improve the patient's condition.    Linard Millers, MD 9/22/201711:21 AM

## 2016-01-06 NOTE — Progress Notes (Addendum)
Patient ID: Edgar Mooney, male   DOB: 1965-05-14, 50 y.o.   MRN: [id number] D: Client visible on the unit, seen in dayroom. Reports depression "7" of 10, "talking to counselor about new options for long term treatment" Client reports he has children, but no support system "I done did so much, we don't have a good relationship" "I got to get myself together first" A: Writer provided emotional support, commended client for look ahead to sobriety and long term rehab. Medications reviewed, administered as ordered. Staff will monitor q30min for safety. R: Client is safe on unit, did not attended group.

## 2016-01-07 DIAGNOSIS — F333 Major depressive disorder, recurrent, severe with psychotic symptoms: Principal | ICD-10-CM

## 2016-01-07 NOTE — Progress Notes (Signed)
Patient ID: Edgar Mooney, male   DOB: [birth year], 50 y.o.   MRN: [id number] D: Patient is visible in the milieu.  He is observed sitting in the day room watching TV.  He states his sleep and appetite are fair; energy is low; concentration is poor.  He presents with flat, blunted affect; depressed mood.  He denies any thoughts of self harm, HI/AVH.  Patient reports withdrawal symptoms as chilling and cravings.  His goal today is to "not use."  He would like to talk to the social worker regarding long term tx.  Patient is homeless. A: Continue to monitor medication management and MD orders.  Safety checks completed every 15 minutes per protocol.  Offer support and encouragement as needed. R: Patient is receptive to staff; his behavior is appropriate.

## 2016-01-07 NOTE — BHH Group Notes (Signed)
[place] LCSW Group Note  Group session was about Understanding Change and Initiating Change.  Participation - Present.  Began the discussion by having group participants identify Changes they can Control and then Changes they Can't Control. Then revisited with the group the list of Changes that can be Controlled to identify if the Changes are Hard or Easy to control. Particularly focused the idea of Changing on Thinking, Personality and Actions which all participants identified as Hard to change.   1- Gained understanding of the inevitability of Change. 2- Assessed each Change individually. 3- Understand the importance of change or Rewards of change. 4- Identifying one small step toward big Change.  Patient progress - Identified that much of his change is around the people he is around and the need to make some of those changes.   Christene Lye MSW, LCSW

## 2016-01-07 NOTE — Progress Notes (Signed)
Patient attended AA group meeting.  

## 2016-01-07 NOTE — BHH Group Notes (Signed)
[place] Group Notes:  (Nursing/MHT/Case Management/Adjunct)  Date:  01/07/2016  Time:  1:30pm   Type of Therapy:  Nurse Education  Participation Level:  Active  Participation Quality:  Appropriate, Attentive, Sharing and Supportive  Affect:  Appropriate  Cognitive:  Alert and Appropriate  Insight:  Improving  Engagement in Group:  Engaged  Modes of Intervention:  Discussion and Education  Summary of Progress/Problems:  Group topic today was Psychiatrist.  He was very supportive during group.  He talked about his struggles with his sobriety.  He talked about how he does these four simple things that helped when he was sober for 2 1/2 years (get up pray for good day, don't use, go to a meeting and pray at bedtime thanking the Reita Cliche that he didn't use).  He is planning on trying to use these again and hoping for long term treatment.  Manuella Ghazi RN, BSN 01/07/16
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
## 2016-01-07 NOTE — BHH Group Notes (Addendum)
Late entry from 9/22:       [facility] LCSW Group Therapy 01/06/16 1:15 PM Type of Therapy: Group Therapy Participation Level: Minimal  Participation Quality: Attentive  Affect: Depressed & flat  Cognitive: Alert and Oriented  Insight: Developing/Improving and Engaged  Engagement in Therapy: Developing/Improving and Engaged  Modes of Intervention: Clarification, Confrontation, Discussion, Education, Exploration, Limit-setting, Orientation, Problem-solving, Rapport Building, Art therapist, Socialization and Support  Summary of Progress/Problems: The topic for today was establishing healthy boundaries in relationships. Pt processed their feelings related to boundary setting and was able to relate to peers. Pt discussed skills that can be used for establishing healthy boundaries and how boundaries relate to emotional wellness.    Tilden Fossa, MSW, [place] Clinical Social Worker [facility] [phone]

## 2016-01-07 NOTE — BHH Suicide Risk Assessment (Signed)
Late entry from 01/06/16  [facility] INPATIENT:  Family/Significant Other Suicide Prevention Education  Suicide Prevention Education:  Patient Refusal for Family/Significant Other Suicide Prevention Education: The patient Edgar Mooney has refused to provide written consent for family/significant other to be provided Family/Significant Other Suicide Prevention Education during admission and/or prior to discharge.  Physician notified. SPE reviewed with patient and brochure provided. Patient encouraged to return to hospital if having suicidal thoughts, patient verbalized his/her understanding and has no further questions at this time.   Edgar Mooney 01/07/2016, 9:52 PM

## 2016-01-07 NOTE — Progress Notes (Signed)
Edgar Westbrook MD Progress Note  01/07/2016 11:24 AM Edgar Mooney  MRN:  [id number] Subjective: Patient reports " I am want to discharge to [facility] [place] on Monday."  Objective: Edgar Mooney is awake, alert and oriented *4. Seen resting in bedroom. Denies suicidal or homicidal ideation. Denies auditory or visual hallucination and does not appear to be responding to internal stimuli.  Patient reports he is medication compliant without mediation side effects. Report learning new coping skills and attending group session. Reports he missed AA meeting on last night.  Patient states " I am feeling better than when I was first admitted." Reports good appetite and resting well. Support, encouragement and reassurance was provided.   Principal Problem: Major depressive disorder, recurrent episode, severe, with psychosis ([place]) Diagnosis:   Patient Active Problem List   Diagnosis Date Noted  . Major depressive disorder, recurrent severe without psychotic features ([place]) [F33.2] 01/05/2016  . Cocaine abuse [F14.10] 01/05/2016  . Benzodiazepine abuse [F13.10] 01/05/2016  . Major depressive disorder, recurrent episode, severe, with psychosis ([place]) [F33.3] 01/05/2016  . Gout [M10.9] 07/06/2015  . Hep C w/o coma, chronic ([place]) [B18.2] 07/06/2015  . HTN (hypertension) [I10] 12/01/2014  . Elevated blood uric acid level [E79.0] 12/01/2014  . History of ETOH abuse [F10.10] 12/01/2014   Total Time spent with patient: 30 minutes  Past Psychiatric History: See Above  Past Medical History:  Past Medical History:  Diagnosis Date  . Alcohol abuse   . Cocaine abuse   . Depression   . Gout   . Hep C w/o coma, chronic [facility]) diagnosed May 2016   History reviewed. No pertinent surgical history. Family History:  Family History  Problem Relation Age of Onset  . Cancer Mother   . Cancer Father    Family Psychiatric  History: See Above Social History:  History  Alcohol Use  . 75.6 oz/week  . 126 Cans of beer  per week     History  Drug Use  . Types: Cocaine, IV, Heroin, Marijuana    Comment: soberity 01/24/12    Social History   Social History  . Marital status: Single    Spouse name: N/A  . Number of children: N/A  . Years of education: N/A   Social History Main Topics  . Smoking status: Current Some Day Smoker    Packs/day: 1.00    Years: 25.00    Types: Cigarettes  . Smokeless tobacco: Never Used  . Alcohol use 75.6 oz/week    126 Cans of beer per week  . Drug use:     Types: Cocaine, IV, Heroin, Marijuana     Comment: soberity 01/24/12  . Sexual activity: Not Currently   Other Topics Concern  . None   Social History Narrative  . None   Additional Social History:    Pain Medications: Pt denies.  Prescriptions: Pt denies.  Over the Counter: Pt denies.  History of alcohol / drug use?: Yes Name of Substance 1: Alcohol 1 - Age of First Use: Pt reported: "[age]."  1 - Amount (size/oz): 18 beers per day 1 - Frequency: daily 1 - Duration: 3-4 weeks steady 1 - Last Use / Amount: 01/04/16 Name of Substance 2: Heroin 2 - Age of First Use: Pt reported: "[age]."  2 - Amount (size/oz): Pt reported: "a twenty bag."  2 - Frequency: 1-2 times per month 2 - Duration: 2 months 2 - Last Use / Amount: about a week ago Name of Substance 3: Cocaine/Crack  3 - Age of First Use: Pt reported: "[age]."  3 - Amount (size/oz): Pt reported: $100 worth."  3 - Frequency: Pt reported he used last night, (01/03/16). 3 - Duration: last 3-4 months 3 - Last Use / Amount: 01/03/16              Sleep: Fair  Appetite:  Fair  Current Medications: Current Facility-Administered Medications  Medication Dose Route Frequency Provider Last Rate Last Dose  . acetaminophen (TYLENOL) tablet 650 mg  650 mg Oral Q4H PRN [name], NP      . alum & mag hydroxide-simeth (MAALOX/MYLANTA) 200-200-20 MG/5ML suspension 30 mL  30 mL Oral PRN [name], NP      . buPROPion  [facility] SR) 12 hr tablet 300 mg  300 mg Oral Daily [name], NP   300 mg at 01/07/16 0802  . feeding supplement (ENSURE ENLIVE) (ENSURE ENLIVE) liquid 237 mL  237 mL Oral BID PRN Jenne Campus, MD      . ibuprofen (ADVIL,MOTRIN) tablet 600 mg  600 mg Oral Q8H PRN [name], NP      . magnesium hydroxide (MILK OF MAGNESIA) suspension 30 mL  30 mL Oral Daily PRN [name], NP      . nicotine (NICODERM CQ - dosed in mg/24 hours) patch 21 mg  21 mg Transdermal Daily [name], NP   21 mg at 01/06/16 0843  . ondansetron (ZOFRAN) tablet 4 mg  4 mg Oral Q8H PRN [name], NP      . zolpidem (AMBIEN) tablet 5 mg  5 mg Oral QHS PRN [name], NP   5 mg at 01/06/16 2131    Lab Results: No results found for this or any previous visit (from the past 48 hour(s)).  Blood Alcohol level:  Lab Results  Component Value Date   ETH <5 01/04/2016   ETH 34 (H) 99991111    Metabolic Disorder Labs: No results found for: HGBA1C, MPG No results found for: PROLACTIN No results found for: CHOL, TRIG, HDL, CHOLHDL, VLDL, LDLCALC  Physical Findings: AIMS: Facial and Oral Movements Muscles of Facial Expression: None, normal Lips and Perioral Area: None, normal Jaw: None, normal Tongue: None, normal,Extremity Movements Upper (arms, wrists, hands, fingers): None, normal Lower (legs, knees, ankles, toes): None, normal, Trunk Movements Neck, shoulders, hips: None, normal, Overall Severity Severity of abnormal movements (highest score from questions above): None, normal Incapacitation due to abnormal movements: None, normal Patient's awareness of abnormal movements (rate only patient's report): No Awareness, Dental Status Current problems with teeth and/or dentures?: No Does patient usually wear dentures?: No  CIWA:  CIWA-Ar Total: 0 COWS:     Musculoskeletal: Strength & Muscle Tone: within normal limits Gait & Station: normal Patient leans: N/A  Psychiatric Specialty  Exam: Physical Exam  Nursing note and vitals reviewed. Constitutional: He appears well-developed.  Psychiatric: He has a normal mood and affect. His behavior is normal.    Review of Systems  Psychiatric/Behavioral: Negative for depression.    Blood pressure 118/77, pulse 65, temperature 98.2 F (36.8 C), resp. rate 18, height 5\' 2"  (1.575 m), weight 63.7 kg (140 lb 8 oz), SpO2 98 %.Body mass index is 25.7 kg/m.  General Appearance: Casual and Fairly Groomed  Eye Contact:  Good  Speech:  Clear and Coherent  Volume:  Normal  Mood:  Anxious and Depressed  Affect:  Congruent  Thought Process:  Coherent  Orientation:  Full (  Time, Place, and Person)  Thought Content:  Hallucinations: None  Suicidal Thoughts:  No  Homicidal Thoughts:  No  Memory:  Immediate;   Fair Remote;   Fair  Judgement:  Fair  Insight:  Present  Psychomotor Activity:  Normal  Concentration:  Concentration: Fair  Recall:  Good  Fund of Knowledge:  Fair  Language:  Good  Akathisia:  No  Handed:  Right  AIMS (if indicated):     Assets:  Communication Skills Desire for Improvement Resilience Social Support  ADL's:  Intact  Cognition:  WNL  Sleep:  Number of Hours: 5.5     I agree with current treatment plan on 01/07/2016, Patient seen face-to-face for psychiatric evaluation follow-up, chart reviewed. Reviewed the information documented and agree with the treatment plan.  Treatment Plan Summary: Daily contact with patient to assess and evaluate symptoms and progress in treatment and Medication management   Continue with Wellbutrin 300 mg, for mood stabilization. Continue with Ambien for insomnia Will continue to monitor vitals ,medication compliance and treatment side effects while patient is here.  Reviewed labs Glucose 101 elevated ,BAL - 0, UDS - pos for cocaine, thc and benzodiazepines. CSW will start working on disposition.  Patient to participate in therapeutic milieu  Derrill Center,  NP 01/07/2016, 11:24 AM

## 2016-01-07 NOTE — Progress Notes (Signed)
D.  Pt pleasant on approach, denies complaints at this time.  Positive for evening AA group, interacting appropriately with peers on the unit.  Pt denies SI/HI/hallucinations at this time.  A.  Support and encouragement offered  R.  Pt remains safe on the unit, will continue to monitor.

## 2016-01-08 MED ORDER — ZOLPIDEM TARTRATE 10 MG PO TABS
10.0000 mg | ORAL_TABLET | Freq: Every evening | ORAL | Status: DC | PRN
  Administered 2016-01-08: 10 mg via ORAL
  Filled 2016-01-08: qty 1

## 2016-01-08 NOTE — Plan of Care (Signed)
Problem: Safety: Goal: Periods of time without injury will increase Outcome: Progressing Patient has not engaged in self harm.  Problem: Medication: Goal: Compliance with prescribed medication regimen will improve Outcome: Progressing Patient is med compliant.   

## 2016-01-08 NOTE — Progress Notes (Signed)
D.  Pt pleasant on approach, no complaints voiced.  Pt states sleep has been a problem and is pleased to know that his Ambien dose was increased to 10 mg.  He is hopeful this will be effective.  Pt was positive for evening AA group, interacting appropriately with peers on the unit.  Pt denies SI/HI/hallucinations at this time.  A.  Support and encouragement offered  R.  Pt remains safe on the unit, will continue to monitor.

## 2016-01-08 NOTE — Progress Notes (Signed)
D: Patient is alert and oriented. Pt's mood and affect is pleasant and appropriate to circumstance. Pt denies SI/HI and AVH. Pt reports not sleeping well and feeling fatigued this morning. Pt rates depression 6/10, hopelessness and anxiety 7/10. Pt reports hs goal for the day is "being nice." A: Active listening by RN. Encouragement/Support provided to pt. Scheduled medications administered per providers orders (See MAR). 15 minute checks continued per protocol for patient safety.  R: Patient cooperative and receptive to nursing interventions. Pt remains safe.

## 2016-01-08 NOTE — BHH Group Notes (Signed)
[facility] Group Notes:  (Nursing/MHT/Case Management/Adjunct)  Date:  01/08/2016  Time:  1330  Type of Therapy:  Nurse Education  Participation Level:  Did Not Attend  Participation Quality:    Affect:    Cognitive:    Insight:    Engagement in Group:    Modes of Intervention:    Summary of Progress/Problems: Patient was invited to group however elected not to attend.  Jamie Kato 01/08/2016, 2:15 PM

## 2016-01-08 NOTE — BHH Group Notes (Signed)
[facility] LCSW Group Therapy  01/08/2016 10:00 AM   Type of Therapy:  Group Therapy  Participation Level:  Did Not Attend  Theressa Millard, LCSW 01/08/2016 4:13 PM

## 2016-01-08 NOTE — Progress Notes (Signed)
Patient did attend the evening speaker AA meeting.  

## 2016-01-08 NOTE — Plan of Care (Signed)
Problem: Activity: Goal: Interest or engagement in activities will improve Outcome: Progressing Pt did attend evening AA group   

## 2016-01-08 NOTE — Progress Notes (Signed)
[facility] MD Progress Note  01/08/2016 1:29 PM Edgar Mooney  MRN:  [id number] Subjective: Patient reports " I am feeling better today, I was still unable to rest at night."  Objective: Edgar Mooney is awake, alert and oriented *4. Seen resting in bedroom. Denies suicidal or homicidal ideation. Denies auditory or visual hallucination and does not appear to be responding to internal stimuli.  Patient appears flat and guarded throughout this assessment.  Patient denies medication sideeffects. Patient states " Iam feeling fine", reports he is eager to start a long term treatment program. Reports good appetite and resting well. Support, encouragement and reassurance was provided.   Principal Problem: Major depressive disorder, recurrent episode, severe, with psychosis ([place]) Diagnosis:   Patient Active Problem List   Diagnosis Date Noted  . Major depressive disorder, recurrent severe without psychotic features ([place]) [F33.2] 01/05/2016  . Cocaine abuse [F14.10] 01/05/2016  . Benzodiazepine abuse [F13.10] 01/05/2016  . Major depressive disorder, recurrent episode, severe, with psychosis ([place]) [F33.3] 01/05/2016  . Gout [M10.9] 07/06/2015  . Hep C w/o coma, chronic ([place]) [B18.2] 07/06/2015  . HTN (hypertension) [I10] 12/01/2014  . Elevated blood uric acid level [E79.0] 12/01/2014  . History of ETOH abuse [F10.10] 12/01/2014   Total Time spent with patient: 30 minutes  Past Psychiatric History: See Above  Past Medical History:  Past Medical History:  Diagnosis Date  . Alcohol abuse   . Cocaine abuse   . Depression   . Gout   . Hep C w/o coma, chronic [facility]) diagnosed May 2016   History reviewed. No pertinent surgical history. Family History:  Family History  Problem Relation Age of Onset  . Cancer Mother   . Cancer Father    Family Psychiatric  History: See Above Social History:  History  Alcohol Use  . 75.6 oz/week  . 126 Cans of beer per week     History  Drug Use  . Types:  Cocaine, IV, Heroin, Marijuana    Comment: soberity 01/24/12    Social History   Social History  . Marital status: Single    Spouse name: N/A  . Number of children: N/A  . Years of education: N/A   Social History Main Topics  . Smoking status: Current Some Day Smoker    Packs/day: 1.00    Years: 25.00    Types: Cigarettes  . Smokeless tobacco: Never Used  . Alcohol use 75.6 oz/week    126 Cans of beer per week  . Drug use:     Types: Cocaine, IV, Heroin, Marijuana     Comment: soberity 01/24/12  . Sexual activity: Not Currently   Other Topics Concern  . None   Social History Narrative  . None   Additional Social History:    Pain Medications: Pt denies.  Prescriptions: Pt denies.  Over the Counter: Pt denies.  History of alcohol / drug use?: Yes Name of Substance 1: Alcohol 1 - Age of First Use: Pt reported: "[age]."  1 - Amount (size/oz): 18 beers per day 1 - Frequency: daily 1 - Duration: 3-4 weeks steady 1 - Last Use / Amount: 01/04/16 Name of Substance 2: Heroin 2 - Age of First Use: Pt reported: "[age]."  2 - Amount (size/oz): Pt reported: "a twenty bag."  2 - Frequency: 1-2 times per month 2 - Duration: 2 months 2 - Last Use / Amount: about a week ago Name of Substance 3: Cocaine/Crack 3 - Age of First Use: Pt reported: "[age]."  3 - Amount (size/oz): Pt reported: $100 worth."  3 - Frequency: Pt reported he used last night, (01/03/16). 3 - Duration: last 3-4 months 3 - Last Use / Amount: 01/03/16              Sleep: Fair  Appetite:  Fair  Current Medications: Current Facility-Administered Medications  Medication Dose Route Frequency Provider Last Rate Last Dose  . acetaminophen (TYLENOL) tablet 650 mg  650 mg Oral Q4H PRN [name], NP   650 mg at 01/08/16 1209  . alum & mag hydroxide-simeth (MAALOX/MYLANTA) 200-200-20 MG/5ML suspension 30 mL  30 mL Oral PRN [name], NP      . buPROPion [facility] SR) 12 hr tablet  300 mg  300 mg Oral Daily [name], NP   300 mg at 01/08/16 0831  . feeding supplement (ENSURE ENLIVE) (ENSURE ENLIVE) liquid 237 mL  237 mL Oral BID PRN Jenne Campus, MD      . ibuprofen (ADVIL,MOTRIN) tablet 600 mg  600 mg Oral Q8H PRN [name], NP      . magnesium hydroxide (MILK OF MAGNESIA) suspension 30 mL  30 mL Oral Daily PRN [name], NP      . nicotine (NICODERM CQ - dosed in mg/24 hours) patch 21 mg  21 mg Transdermal Daily [name], NP   21 mg at 01/06/16 0843  . ondansetron (ZOFRAN) tablet 4 mg  4 mg Oral Q8H PRN [name], NP      . zolpidem (AMBIEN) tablet 5 mg  5 mg Oral QHS PRN [name], NP   5 mg at 01/07/16 2202    Lab Results: No results found for this or any previous visit (from the past 79 hour(s)).  Blood Alcohol level:  Lab Results  Component Value Date   ETH <5 01/04/2016   ETH 34 (H) 99991111    Metabolic Disorder Labs: No results found for: HGBA1C, MPG No results found for: PROLACTIN No results found for: CHOL, TRIG, HDL, CHOLHDL, VLDL, LDLCALC  Physical Findings: AIMS: Facial and Oral Movements Muscles of Facial Expression: None, normal Lips and Perioral Area: None, normal Jaw: None, normal Tongue: None, normal,Extremity Movements Upper (arms, wrists, hands, fingers): None, normal Lower (legs, knees, ankles, toes): None, normal, Trunk Movements Neck, shoulders, hips: None, normal, Overall Severity Severity of abnormal movements (highest score from questions above): None, normal Incapacitation due to abnormal movements: None, normal Patient's awareness of abnormal movements (rate only patient's report): No Awareness, Dental Status Current problems with teeth and/or dentures?: No Does patient usually wear dentures?: No  CIWA:  CIWA-Ar Total: 0 COWS:     Musculoskeletal: Strength & Muscle Tone: within normal limits Gait & Station: normal Patient leans: N/A  Psychiatric Specialty Exam: Physical Exam   Nursing note and vitals reviewed. Constitutional: He is oriented to person, place, and time. He appears well-developed.  Musculoskeletal: Normal range of motion.  Neurological: He is alert and oriented to person, place, and time.  Skin: Skin is warm and dry.  Psychiatric: He has a normal mood and affect. His behavior is normal.    Review of Systems  Psychiatric/Behavioral: Negative for depression.    Blood pressure 118/78, pulse 65, temperature 98.1 F (36.7 C), temperature source Oral, resp. rate 20, height 5\' 2"  (1.575 m), weight 63.7 kg (140 lb 8 oz), SpO2 98 %.Body mass index is 25.7 kg/m.  General Appearance: Fairly Groomed and Guarded  Eye Contact:  Good  Speech:  Clear and Coherent  Volume:  Normal  Mood:  Anxious and Depressed  Affect:  Congruent  Thought Process:  Coherent  Orientation:  Full (Time, Place, and Person)  Thought Content:  Hallucinations: None  Suicidal Thoughts:  No  Homicidal Thoughts:  No  Memory:  Immediate;   Fair Remote;   Fair  Judgement:  Fair  Insight:  Present  Psychomotor Activity:  Normal  Concentration:  Concentration: Fair  Recall:  Good  Fund of Knowledge:  Fair  Language:  Good  Akathisia:  No  Handed:  Right  AIMS (if indicated):     Assets:  Communication Skills Desire for Improvement Resilience Social Support  ADL's:  Intact  Cognition:  WNL  Sleep:  Number of Hours: 6.5     I agree with current treatment plan on 01/08/2016, Patient seen face-to-face for psychiatric evaluation follow-up, chart reviewed. Reviewed the information documented and agree with the treatment plan.  Treatment Plan Summary: Daily contact with patient to assess and evaluate symptoms and progress in treatment and Medication management   Continue with Wellbutrin 300 mg, for mood stabilization. Continue with Ambien 10 mg for insomnia Will continue to monitor vitals ,medication compliance and treatment side effects while patient is here.  Reviewed  labs Glucose 101 elevated ,BAL - 0, UDS - pos for cocaine, thc and benzodiazepines. CSW will start working on disposition.  Patient to participate in therapeutic milieu  Derrill Center, NP 01/08/2016, 1:29 PM

## 2016-01-09 NOTE — Progress Notes (Signed)
The patient attended this evening's A. A. Meeting and was appropriate.  

## 2016-01-09 NOTE — Plan of Care (Signed)
Problem: Activity: Goal: Interest or engagement in activities will improve Outcome: Progressing Pt reports that his goal for today is attend groups  Problem: Education: Goal: Ability to make informed decisions regarding treatment will improve Outcome: Progressing Pt reports that he is working with his SW for aftercare treatment

## 2016-01-09 NOTE — Progress Notes (Signed)
[facility] MD Progress Note  01/09/2016 2:38 PM Edgar Mooney  MRN:  [id number] Subjective:  Pt reports doing somewhat better denies current SI HI and is willing to attend [facility] and possibly a long term program after that. No c/o withdrawal today. Principal Problem: Major depressive disorder, recurrent episode, severe, with psychosis ([place]) Diagnosis:   Patient Active Problem List   Diagnosis Date Noted  . Major depressive disorder, recurrent severe without psychotic features ([place]) [F33.2] 01/05/2016  . Cocaine abuse [F14.10] 01/05/2016  . Benzodiazepine abuse [F13.10] 01/05/2016  . Major depressive disorder, recurrent episode, severe, with psychosis ([place]) [F33.3] 01/05/2016  . Gout [M10.9] 07/06/2015  . Hep C w/o coma, chronic ([place]) [B18.2] 07/06/2015  . HTN (hypertension) [I10] 12/01/2014  . Elevated blood uric acid level [E79.0] 12/01/2014  . History of ETOH abuse [F10.10] 12/01/2014   Total Time spent with patient: 15 minutes  Past Psychiatric History: no change  Past Medical History:  Past Medical History:  Diagnosis Date  . Alcohol abuse   . Cocaine abuse   . Depression   . Gout   . Hep C w/o coma, chronic [facility]) diagnosed May 2016   History reviewed. No pertinent surgical history. Family History:  Family History  Problem Relation Age of Onset  . Cancer Mother   . Cancer Father    Family Psychiatric  History: no change Social History:  History  Alcohol Use  . 75.6 oz/week  . 126 Cans of beer per week     History  Drug Use  . Types: Cocaine, IV, Heroin, Marijuana    Comment: soberity 01/24/12    Social History   Social History  . Marital status: Single    Spouse name: N/A  . Number of children: N/A  . Years of education: N/A   Social History Main Topics  . Smoking status: Current Some Day Smoker    Packs/day: 1.00    Years: 25.00    Types: Cigarettes  . Smokeless tobacco: Never Used  . Alcohol use 75.6 oz/week    126 Cans of beer per week  . Drug use:      Types: Cocaine, IV, Heroin, Marijuana     Comment: soberity 01/24/12  . Sexual activity: Not Currently   Other Topics Concern  . None   Social History Narrative  . None   Additional Social History:    Pain Medications: Pt denies.  Prescriptions: Pt denies.  Over the Counter: Pt denies.  History of alcohol / drug use?: Yes Name of Substance 1: Alcohol 1 - Age of First Use: Pt reported: "[age]."  1 - Amount (size/oz): 18 beers per day 1 - Frequency: daily 1 - Duration: 3-4 weeks steady 1 - Last Use / Amount: 01/04/16 Name of Substance 2: Heroin 2 - Age of First Use: Pt reported: "[age]."  2 - Amount (size/oz): Pt reported: "a twenty bag."  2 - Frequency: 1-2 times per month 2 - Duration: 2 months 2 - Last Use / Amount: about a week ago Name of Substance 3: Cocaine/Crack 3 - Age of First Use: Pt reported: "[age]."  3 - Amount (size/oz): Pt reported: $100 worth."  3 - Frequency: Pt reported he used last night, (01/03/16). 3 - Duration: last 3-4 months 3 - Last Use / Amount: 01/03/16              Sleep: Fair  Appetite:  Good  Current Medications: Current Facility-Administered Medications  Medication Dose Route Frequency Provider Last Rate Last Dose  .  acetaminophen (TYLENOL) tablet 650 mg  650 mg Oral Q4H PRN [name], NP   650 mg at 01/08/16 1209  . alum & mag hydroxide-simeth (MAALOX/MYLANTA) 200-200-20 MG/5ML suspension 30 mL  30 mL Oral PRN [name], NP      . buPROPion [facility] SR) 12 hr tablet 300 mg  300 mg Oral Daily [name], NP   300 mg at 01/09/16 0834  . feeding supplement (ENSURE ENLIVE) (ENSURE ENLIVE) liquid 237 mL  237 mL Oral BID PRN Jenne Campus, MD   237 mL at 01/08/16 1444  . ibuprofen (ADVIL,MOTRIN) tablet 600 mg  600 mg Oral Q8H PRN [name], NP      . magnesium hydroxide (MILK OF MAGNESIA) suspension 30 mL  30 mL Oral Daily PRN [name], NP      . nicotine (NICODERM CQ - dosed in mg/24  hours) patch 21 mg  21 mg Transdermal Daily [name], NP   21 mg at 01/06/16 0843  . ondansetron (ZOFRAN) tablet 4 mg  4 mg Oral Q8H PRN [name], NP   4 mg at 01/09/16 0834  . zolpidem (AMBIEN) tablet 10 mg  10 mg Oral QHS PRN Derrill Center, NP   10 mg at 01/08/16 2217    Lab Results: No results found for this or any previous visit (from the past 48 hour(s)).  Blood Alcohol level:  Lab Results  Component Value Date   ETH <5 01/04/2016   ETH 34 (H) 99991111    Metabolic Disorder Labs: No results found for: HGBA1C, MPG No results found for: PROLACTIN No results found for: CHOL, TRIG, HDL, CHOLHDL, VLDL, LDLCALC  Physical Findings: AIMS: Facial and Oral Movements Muscles of Facial Expression: None, normal Lips and Perioral Area: None, normal Jaw: None, normal Tongue: None, normal,Extremity Movements Upper (arms, wrists, hands, fingers): None, normal Lower (legs, knees, ankles, toes): None, normal, Trunk Movements Neck, shoulders, hips: None, normal, Overall Severity Severity of abnormal movements (highest score from questions above): None, normal Incapacitation due to abnormal movements: None, normal Patient's awareness of abnormal movements (rate only patient's report): No Awareness, Dental Status Current problems with teeth and/or dentures?: No Does patient usually wear dentures?: No  CIWA:  CIWA-Ar Total: 3 COWS:     Musculoskeletal: Strength & Muscle Tone: within normal limits Gait & Station: normal Patient leans: N/A  Psychiatric Specialty Exam: Physical Exam  ROS  Blood pressure 113/60, pulse 81, temperature 98.1 F (36.7 C), temperature source Oral, resp. rate 16, height 5\' 2"  (1.575 m), weight 63.7 kg (140 lb 8 oz), SpO2 98 %.Body mass index is 25.7 kg/m.  General Appearance: Casual  Eye Contact:  Good  Speech:  Clear and Coherent  Volume:  Normal  Mood:  Euthymic  Affect:  Negative  Thought Process:  Coherent and Goal Directed  Orientation:   Negative  Thought Content:  Negative  Suicidal Thoughts:  No  Homicidal Thoughts:  No  Memory:  Negative Immediate;   Good  Judgement:  Good  Insight:  Good  Psychomotor Activity:  Normal  Concentration:  Concentration: Good and Attention Span: Good  Recall:  Good  Fund of Knowledge:  Good  Language:  Good  Akathisia:  No  Handed:  Right  AIMS (if indicated):     Assets:  Communication Skills Desire for Improvement Resilience  ADL's:  Intact  Cognition:  WNL  Sleep:  Number of Hours: 6     Treatment  Plan Summary: Daily contact with patient to assess and evaluate symptoms and progress in treatment, Medication management and transfer to long term treatment starting Wed.  Linard Millers, MD 01/09/2016, 2:38 PM

## 2016-01-09 NOTE — BHH Group Notes (Signed)
[place] LCSW Group Therapy 01/09/2016  1:15 pm  Type of Therapy: Group Therapy Participation Level: Active  Participation Quality: Attentive, Sharing and Supportive  Affect: Depressed and Flat  Cognitive: Alert and Oriented  Insight: Developing/Improving and Engaged  Engagement in Therapy: Developing/Improving and Engaged  Modes of Intervention: Clarification, Confrontation, Discussion, Education, Exploration,  Limit-setting, Orientation, Problem-solving, Rapport Building, Art therapist, Socialization and Support  Summary of Progress/Problems: Pt identified obstacles faced currently and processed barriers involved in overcoming these obstacles. Pt identified steps necessary for overcoming these obstacles and explored motivation (internal and external) for facing these difficulties head on. Pt further identified one area of concern in their lives and chose a goal to focus on for today. Patient discussed feeling accountable for many of his obstacles including his substance abuse and unemployment.  Tilden Fossa, LCSW Clinical Social Worker [facility] [phone]

## 2016-01-09 NOTE — Progress Notes (Signed)
D:Pt is in bed this morning with c/o mild nausea. He rates depression as a 7, hopelessness as a 5 and anxiety as a 6 on 0-10 scale with 10 being the most. Pt's goal is to attend groups and work on staying clean.   A: Offered support, encouragement and 15 minute checks.  R:Pt denies si and hi. Safety maintained on the unit.

## 2016-01-09 NOTE — Progress Notes (Signed)
D- Pt mood was pleasant and affect was appropriate to circumstance. He says that his day was good a 8 out of 10. He interacts well with the milieu and in group.  A- Support and encouragement given by staff.  R- Pt contracts for safety and has no complaints. Pt denies SI/HI/Self harm. He denies A/V hallucinations. Environmental checks each shift and patient checks every 15 minutes. He remains safe on the unit.

## 2016-01-10 MED ORDER — ZOLPIDEM TARTRATE 10 MG PO TABS: 10.0000 mg | ORAL_TABLET | Freq: Every evening | ORAL | 0 refills | Status: DC | PRN

## 2016-01-10 MED ORDER — NICOTINE 21 MG/24HR TD PT24: 21.0000 mg | MEDICATED_PATCH | Freq: Every day | TRANSDERMAL | 0 refills | Status: DC

## 2016-01-10 MED ORDER — BUPROPION HCL ER (SR) 150 MG PO TB12: 300.0000 mg | ORAL_TABLET | Freq: Every day | ORAL | 0 refills | Status: DC

## 2016-01-10 MED ORDER — IBUPROFEN 600 MG PO TABS: ORAL_TABLET | ORAL | 0 refills | Status: DC

## 2016-01-10 NOTE — BHH Group Notes (Signed)
[place] LCSW Group Therapy 01/10/2016 1:15 PM Type of Therapy: Group Therapy Participation Level: Active  Participation Quality: Attentive  Affect: Appropriate  Cognitive: Alert and Oriented  Insight: Developing/Improving and Engaged  Engagement in Therapy: Developing/Improving and Engaged  Modes of Intervention: Activity, Clarification, Confrontation, Discussion, Education, Exploration, Limit-setting, Orientation, Problem-solving, Rapport Building, Art therapist, Socialization and Support  Summary of Progress/Problems: Patient was attentive and engaged with speaker from [place]. Patient was attentive to speaker while they shared their story of dealing with mental health and overcoming it. Patient expressed interest in their programs and services and received information on their agency. Patient processed ways they can relate to the speaker.   Tilden Fossa, LCSW Clinical Social Worker [facility] [phone]

## 2016-01-10 NOTE — BHH Suicide Risk Assessment (Signed)
[facility] Discharge Suicide Risk Assessment   Principal Problem: Major depressive disorder, recurrent episode, severe, with psychosis ([place]) Discharge Diagnoses:  Patient Active Problem List   Diagnosis Date Noted  . Major depressive disorder, recurrent severe without psychotic features ([place]) [F33.2] 01/05/2016  . Cocaine abuse [F14.10] 01/05/2016  . Benzodiazepine abuse [F13.10] 01/05/2016  . Major depressive disorder, recurrent episode, severe, with psychosis ([place]) [F33.3] 01/05/2016  . Gout [M10.9] 07/06/2015  . Hep C w/o coma, chronic ([place]) [B18.2] 07/06/2015  . HTN (hypertension) [I10] 12/01/2014  . Elevated blood uric acid level [E79.0] 12/01/2014  . History of ETOH abuse [F10.10] 12/01/2014    Total Time spent with patient: 15 minutes  Musculoskeletal: Strength & Muscle Tone: within normal limits Gait & Station: normal Patient leans: N/A  Psychiatric Specialty Exam: Review of Systems  All other systems reviewed and are negative.   Blood pressure 105/76, pulse 71, temperature 98.3 F (36.8 C), temperature source Oral, resp. rate 17, height 5\' 2"  (1.575 m), weight 63.7 kg (140 lb 8 oz), SpO2 98 %.Body mass index is 25.7 kg/m.  General Appearance: Fairly Groomed  Engineer, water::  Good  Speech:  Clear and Coherent409  Volume:  Normal  Mood:  Euthymic  Affect:  Congruent  Thought Process:  Coherent  Orientation:  Full (Time, Place, and Person)  Thought Content:  Negative  Suicidal Thoughts:  No  Homicidal Thoughts:  No  Memory:  Negative  Judgement:  Good  Insight:  Good  Psychomotor Activity:  Normal  Concentration:  Good  Recall:  Good  Fund of Knowledge:Good  Language: Good  Akathisia:  No  Handed:  Right  AIMS (if indicated):     Assets:  Desire for Improvement Resilience  Sleep:  Number of Hours: 5.75  Cognition: WNL  ADL's:  Intact   Mental Status Per Nursing Assessment::   On Admission:  Suicidal ideation indicated by patient  Demographic Factors:   Male, Caucasian and Unemployed  Loss Factors: Decrease in vocational status  Historical Factors: Family history of mental illness or substance abuse  Risk Reduction Factors:   Positive coping skills or problem solving skills  Continued Clinical Symptoms:  Alcohol/Substance Abuse/Dependencies  Cognitive Features That Contribute To Risk:  None    Suicide Risk:  Mild:  Suicidal ideation of limited frequency, intensity, duration, and specificity.  There are no identifiable plans, no associated intent, mild dysphoria and related symptoms, good self-control (both objective and subjective assessment), few other risk factors, and identifiable protective factors, including available and accessible social support. Pt's mood had improved while at our facility and he seems to be benefiting from addition of Wellbutrin.  Follow-up Information    [facility] Follow up on 01/11/2016.   Why:  Screening for possible admission on Weds. 9/27 at 7:45am. Please bring ID letter, 2 week supply of medications, and clothing. Call office if you need to reschedule.  Contact information: [address] [place] [address] [phone]           Plan Of Care/Follow-up recommendations:  Other:  Mr. Starzec is planning to attend the [facility] residential program the day of discharge. He should follow this plan and stay in mental health follow up and continue his antidepressant at present.  Linard Millers, MD 01/10/2016, 3:05 PM

## 2016-01-10 NOTE — Progress Notes (Signed)
D: Patient denies SI/HI and A/V hallucinations; patient reports that he is concerned about the process of getting to [facility] because he has no identification  A: Monitored q 15 minutes; patient encouraged to attend groups; patient educated about medications; patient given medications per physician orders; patient encouraged to express feelings and/or concerns  R: Patient is appropriate and cooperative; patient is assertive but pleasant; patient appears anxious at times; patient attends all groups; patient

## 2016-01-10 NOTE — Progress Notes (Signed)
D: Pt was in the day room upon initial approach.  Pt has appropriate affect and mood.  Pt denies having a goal for tonight.  He reports he is discharging tomorrow and he feels safe to do so.  Pt denies SI/HI, denies hallucinations, denies pain.  Pt has been visible in milieu interacting with peers and staff appropriately.  Pt attended evening group.    A: Introduced self to pt.  Met with pt and offered support and encouragement.  Actively listened to pt.    R: Pt verbally contracts for safety.  He reports he will inform staff of needs and concerns.  Will continue to monitor and assess.

## 2016-01-10 NOTE — Tx Team (Signed)
Interdisciplinary Treatment and Diagnostic Plan Update  01/10/2016 Time of Session: 9:30am Edgar Mooney MRN: [id number]  Principal Diagnosis: Major depressive disorder, recurrent episode, severe, with psychosis ([place])  Current Medications:  Current Facility-Administered Medications  Medication Dose Route Frequency Provider Last Rate Last Dose  . acetaminophen (TYLENOL) tablet 650 mg  650 mg Oral Q4H PRN [name], NP   650 mg at 01/08/16 1209  . alum & mag hydroxide-simeth (MAALOX/MYLANTA) 200-200-20 MG/5ML suspension 30 mL  30 mL Oral PRN [name], NP      . buPROPion [facility] SR) 12 hr tablet 300 mg  300 mg Oral Daily [name], NP   300 mg at 01/10/16 0845  . feeding supplement (ENSURE ENLIVE) (ENSURE ENLIVE) liquid 237 mL  237 mL Oral BID PRN Jenne Campus, MD   237 mL at 01/09/16 1559  . ibuprofen (ADVIL,MOTRIN) tablet 600 mg  600 mg Oral Q8H PRN [name], NP      . magnesium hydroxide (MILK OF MAGNESIA) suspension 30 mL  30 mL Oral Daily PRN [name], NP      . nicotine (NICODERM CQ - dosed in mg/24 hours) patch 21 mg  21 mg Transdermal Daily [name], NP   21 mg at 01/06/16 0843  . ondansetron (ZOFRAN) tablet 4 mg  4 mg Oral Q8H PRN [name], NP   4 mg at 01/09/16 [id number]  . zolpidem (AMBIEN) tablet 10 mg  10 mg Oral QHS PRN Derrill Center, NP   10 mg at 01/08/16 2217   PTA Medications: Prescriptions Prior to Admission  Medication Sig Dispense Refill Last Dose  . ibuprofen (ADVIL,MOTRIN) 200 MG tablet Take 1,000 mg by mouth 3 (three) times daily as needed for headache or moderate pain.   Past Week at Unknown time    Treatment Modalities: Medication Management, Group therapy, Case management,  1 to 1 session with clinician, Psychoeducation, Recreational therapy.   Physician Treatment Plan for Primary Diagnosis: Major depressive disorder, recurrent episode, severe, with psychosis ([place]) Long Term Goal(s): Improvement in symptoms so as  ready for discharge   Short Term Goals: Ability to identify changes in lifestyle to reduce recurrence of condition will improve, Ability to identify and develop effective coping behaviors will improve and Compliance with prescribed medications will improve  Medication Management: Evaluate patient's response, side effects, and tolerance of medication regimen.  Therapeutic Interventions: 1 to 1 sessions, Unit Group sessions and Medication administration.  Evaluation of Outcomes: Adequate for discharge per MD   RN Treatment Plan for Primary Diagnosis: Major depressive disorder, recurrent episode, severe, with psychosis ([place]) Long Term Goal(s): Knowledge of disease and therapeutic regimen to maintain health will improve  Short Term Goals: Ability to remain free from injury will improve, Ability to identify and develop effective coping behaviors will improve and Compliance with prescribed medications will improve  Medication Management: RN will administer medications as ordered by provider, will assess and evaluate patient's response and provide education to patient for prescribed medication. RN will report any adverse and/or side effects to prescribing provider.  Therapeutic Interventions: 1 on 1 counseling sessions, Psychoeducation, Medication administration, Evaluate responses to treatment, Monitor vital signs and CBGs as ordered, Perform/monitor CIWA, COWS, AIMS and Fall Risk screenings as ordered, Perform wound care treatments as ordered.  Evaluation of Outcomes: Adequate for Discharge   LCSW Treatment Plan for Primary Diagnosis: Major depressive disorder, recurrent episode, severe, with psychosis ([place]) Long Term Goal(s): Safe transition to  appropriate next level of care at discharge, Engage patient in therapeutic group addressing interpersonal concerns.  Short Term Goals: Engage patient in aftercare planning with referrals and resources, Increase emotional regulation, Identify triggers  associated with mental health/substance abuse issues and Increase skills for wellness and recovery  Therapeutic Interventions: Assess for all discharge needs, 1 to 1 time with Social worker, Explore available resources and support systems, Assess for adequacy in community support network, Educate family and significant other(s) on suicide prevention, Complete Psychosocial Assessment, Interpersonal group therapy.  Evaluation of Outcomes: Adequate for discharge per MD   Progress in Treatment :  Attending groups: Yes  Participating in groups: Yes  Taking medication as prescribed: Yes, MD continuing to assess for appropriate medication regimen  Toleration medication: Yes  Family/Significant other contact made: No patient declines collateral contacts  Patient understands diagnosis: Yes  Discussing patient identified problems/goals with staff: Yes  Medical problems stabilized or resolved: Yes  Denies suicidal/homicidal ideation: Yes, denies   Issues/concerns per patient self-inventory: None reported  Other: N/A  New problem(s) identified: None reported at this time    New Short Term/Long Term Goal(s): None at this time    Discharge Plan or Barriers: Patient interested in residential treatment. Patient plans to [facility] on 9/27 for screening.    Reason for Continuation of Hospitalization: Anxiety Depression Medication stabilization Suicidal Ideations Withdrawal symptoms  Estimated Length of Stay: Discharge anticipated for tomorrow 01/11/16    Attendees:  Patient:  Physician: Dr. Sharolyn Douglas, MD 01/10/2016 9:30am  Nursing: Willette Brace [facility] 01/10/2016 9:30am  RN Care Manager: Lars Pinks, [place] 01/10/2016 9:30am  Social Workers: Tilden Fossa, LCSW, 01/10/2016 9:30am  Nurse Pratictioners: Agustina Caroli, Samuel Jester, NP 01/10/16  Scribe for Treatment Team: Tilden Fossa, [place] Worker [facility] [phone]

## 2016-01-10 NOTE — Discharge Summary (Signed)
Physician Discharge Summary Note  Patient:  Edgar Mooney is an 50 y.o., male MRN:  [id number] DOB:  16-Dec-1965 Patient phone:  [phone] (home)  Patient address:   [place] [address],   Total Time spent with patient: Greater than 30 minutes  Date of Admission:  01/05/2016  Date of Discharge: 01-11-16  Reason for Admission: Substance induced mood disorder.  Principal Problem: Major depressive disorder, recurrent episode, severe, with psychosis [facility])  Discharge Diagnoses: Patient Active Problem List   Diagnosis Date Noted  . Major depressive disorder, recurrent severe without psychotic features ([place]) [F33.2] 01/05/2016  . Cocaine abuse [F14.10] 01/05/2016  . Benzodiazepine abuse [F13.10] 01/05/2016  . Major depressive disorder, recurrent episode, severe, with psychosis ([place]) [F33.3] 01/05/2016  . Gout [M10.9] 07/06/2015  . Hep C w/o coma, chronic ([place]) [B18.2] 07/06/2015  . HTN (hypertension) [I10] 12/01/2014  . Elevated blood uric acid level [E79.0] 12/01/2014  . History of ETOH abuse [F10.10] 12/01/2014   Past Psychiatric History: MDD, Polysubstance dependence.  Past Medical History:  Past Medical History:  Diagnosis Date  . Alcohol abuse   . Cocaine abuse   . Depression   . Gout   . Hep C w/o coma, chronic [facility]) diagnosed May 2016   History reviewed. No pertinent surgical history.  Family History:  Family History  Problem Relation Age of Onset  . Cancer Mother   . Cancer Father    Family Psychiatric  History: See H&P  Social History:  History  Alcohol Use  . 75.6 oz/week  . 126 Cans of beer per week     History  Drug Use  . Types: Cocaine, IV, Heroin, Marijuana    Comment: soberity 01/24/12    Social History   Social History  . Marital status: Single    Spouse name: N/A  . Number of children: N/A  . Years of education: N/A   Social History Main Topics  . Smoking status: Current Some Day Smoker    Packs/day: 1.00    Years: 25.00     Types: Cigarettes  . Smokeless tobacco: Never Used  . Alcohol use 75.6 oz/week    126 Cans of beer per week  . Drug use:     Types: Cocaine, IV, Heroin, Marijuana     Comment: soberity 01/24/12  . Sexual activity: Not Currently   Other Topics Concern  . None   Social History Narrative  . None   Hospital Course:  Mr Roblyer reports that he has a long history of substance use for alcohol and cocaine and also depression. He states that currently cocaine is his primary drug and he smokes, snorts and uses I.V. He endorses using more and more, unable to cut down, giving up things for use and use despite consequences. For alcohol he endorses tolerance, unable to cut down/stop, use despite knowing it exacerbates his gout, giving up things for use. For depression, which he feels is not completely substance induced, he endorses depressed mood, anhedonia, hopelessness, and suicidal ideations. Denies psychosis, denies mania. Denies homicidal ideations.  Zaidin was admitted to the [facility] Adult Unit with complaints of increased drug use. He says cocaine is his main drug of choice. His UDS was positive for Benzodiziapine, Cocaine & THC. He was also endorsing worsening symptoms of depression triggering suicidal ideations. He was in need of substance abuse treatment as well as mood stabilization treatment.   After evaluation of his presenting symptoms, Luvenia Heller received ativan regimen on a prn basis to prevent substance  withdrawal symptoms from the variable amounts drugs & alcohol he reported using.  During his hospital stay, Jacek was educated on the dangers of mixing illicit drugs with benzodiazepines along with alcohol.   Besides the PRN Ativan detox, Elson was also medicated & discharged on; Wellbutrin SR 300 mg for depression, Nicotine Patch 21 mg for smoking cessation & Ambien 5 mg for insomnia. He presented no other significant medical conditions that required treatmemt. He tolerated his treatment regimen without  any adverse effects reported. Part of Jermichael's discharge plan is a referral & an appointment to a residential treatment center to continue further substance abuse treatment after discharge.  Upon his discharge, Alyus consistently denied any suicidal ideation & appeared motivated to stop abusing substances in order to achieve improved mental health outcomes. He was scheduled to follow up care for routine psychiatric care & medication management as noted below. Patient left [place] in stable condition with prescriptions for his medications and all his belongings were returned to him. He was provided with a 14 days worth, supply samples of his [facility] discharge medications. He left [facility] with all personal belongings in no apparent distress. Transportation per taxi cab. [place] assisted with taxi fare.Marland Kitchen  Physical Findings: AIMS: Facial and Oral Movements Muscles of Facial Expression: None, normal Lips and Perioral Area: None, normal Jaw: None, normal Tongue: None, normal,Extremity Movements Upper (arms, wrists, hands, fingers): None, normal Lower (legs, knees, ankles, toes): None, normal, Trunk Movements Neck, shoulders, hips: None, normal, Overall Severity Severity of abnormal movements (highest score from questions above): None, normal Incapacitation due to abnormal movements: None, normal Patient's awareness of abnormal movements (rate only patient's report): No Awareness, Dental Status Current problems with teeth and/or dentures?: No Does patient usually wear dentures?: No  CIWA:  CIWA-Ar Total: 3 COWS:     Musculoskeletal: Strength & Muscle Tone: within normal limits Gait & Station: normal Patient leans: N/A  Psychiatric Specialty Exam: Physical Exam  Constitutional: He appears well-developed.  HENT:  Head: Normocephalic.  Eyes: Pupils are equal, round, and reactive to light.  Cardiovascular: Normal rate.   Respiratory: Effort normal.  GI: Soft.  Genitourinary:  Genitourinary Comments: Deferred.   Musculoskeletal: Normal range of motion.  Neurological: He is alert.  Skin: Skin is warm.    Review of Systems  Constitutional: Negative.   HENT: Negative.   Eyes: Negative.   Respiratory: Negative.   Cardiovascular: Negative.   Gastrointestinal: Negative.   Genitourinary: Negative.   Musculoskeletal: Negative.   Skin: Negative.   Neurological: Negative.   Endo/Heme/Allergies: Negative.   Psychiatric/Behavioral: Positive for depression (Stable) and substance abuse (Hx. Polysubstance dependence.). Negative for hallucinations, memory loss and suicidal ideas. The patient has insomnia (Stable). The patient is not nervous/anxious.     Blood pressure 112/68, pulse 70, temperature 97.7 F (36.5 C), temperature source Oral, resp. rate 18, height 5\' 2"  (1.575 m), weight 63.7 kg (140 lb 8 oz), SpO2 98 %.Body mass index is 25.7 kg/m.  See Md's SRA  Have you used any form of tobacco in the last 30 days? (Cigarettes, Smokeless Tobacco, Cigars, and/or Pipes): Yes  Has this patient used any form of tobacco in the last 30 days? (Cigarettes, Smokeless Tobacco, Cigars, and/or Pipes):Yes, provided with a nicotine patch prescription.  Blood Alcohol level:  Lab Results  Component Value Date   ETH <5 01/04/2016   ETH 34 (H) [id number]   Metabolic Disorder Labs:  No results found for: HGBA1C, MPG No results found for: PROLACTIN No results found for:  CHOL, TRIG, HDL, CHOLHDL, VLDL, LDLCALC  See Psychiatric Specialty Exam and Suicide Risk Assessment completed by Attending Physician prior to discharge.  Discharge destination:  Home  Is patient on multiple antipsychotic therapies at discharge:  No   Has Patient had three or more failed trials of antipsychotic monotherapy by history:  No  Recommended Plan for Multiple Antipsychotic Therapies: NA    Medication List    TAKE these medications     Indication  buPROPion 150 MG 12 hr tablet Commonly known as:  WELLBUTRIN SR Take 2 tablets (300  mg total) by mouth daily. For depression  Indication:  Major Depressive Disorder   ibuprofen 600 MG tablet Commonly known as:  ADVIL,MOTRIN Take I tablet (600 mg) three times daily as needed: For pain What changed:  medication strength  how much to take  how to take this  when to take this  reasons to take this  additional instructions  Indication:  Pain management   nicotine 21 mg/24hr patch Commonly known as:  NICODERM CQ - dosed in mg/24 hours Place 1 patch (21 mg total) onto the skin daily. For smoking cessation  Indication:  Nicotine Addiction   zolpidem 10 MG tablet Commonly known as:  AMBIEN Take 1 tablet (10 mg total) by mouth at bedtime as needed for sleep.  Indication:  Trouble Sleeping      Follow-up Information    [facility] Follow up on 01/11/2016.   Why:  Screening for possible admission on Weds. 9/27 at 7:45am. Please bring ID letter, 2 week supply of medications, and clothing. Call office if you need to reschedule.  Contact information: [place] [address] [phone]          Follow-up recommendations: Activity:  As tolerated Diet: As recommended by your primary care doctor. Keep all scheduled follow-up appointments as recommended.  Comments: Patient is instructed prior to discharge to: Take all medications as prescribed by his/her mental healthcare provider. Report any adverse effects and or reactions from the medicines to his/her outpatient provider promptly. Patient has been instructed & cautioned: To not engage in alcohol and or illegal drug use while on prescription medicines. In the event of worsening symptoms, patient is instructed to call the crisis hotline, 911 and or go to the nearest ED for appropriate evaluation and treatment of symptoms. To follow-up with his/her primary care provider for your other medical issues, concerns and or health care needs.   Signed: Encarnacion Slates, NP, PMHNP,  FNP-BC 01/12/2016, 11:14 AM

## 2016-01-10 NOTE — Progress Notes (Signed)
  [facility] Adult Case Management Discharge Plan :  Will you be returning to the same living situation after discharge:  No. Patient plans to discharge to [facility] on 9/27 for screening At discharge, do you have transportation home?: Yes,  taxi voucher provided Do you have the ability to pay for your medications: Yes,  patient will be provided with samples and prescriptions  Release of information consent forms completed and in the chart;  Patient's signature needed at discharge.  Patient to Follow up at: Follow-up Information    [facility] Follow up on 01/11/2016.   Why:  Screening for possible admission on Weds. 9/27 at 7:45am. Please bring ID letter, 2 week supply of medications, and clothing. Call office if you need to reschedule.  Contact information: [place] [address] [phone]           Next level of care provider has access to [place] and Suicide Prevention discussed: Yes,  with patient  Have you used any form of tobacco in the last 30 days? (Cigarettes, Smokeless Tobacco, Cigars, and/or Pipes): Yes  Has patient been referred to the Quitline?: Patient refused referral  Patient has been referred for addiction treatment: Yes  Sekai Gitlin L Shayda Kalka 01/10/2016, 8:58 AM

## 2016-01-10 NOTE — [facility] Group Notes (Signed)
Patient did attend group.

## 2016-01-10 NOTE — Progress Notes (Signed)
Marland Kitchen [facility] MD Progress Note  01/10/2016 12:00 PM  Patient Active Problem List   Diagnosis Date Noted  . Major depressive disorder, recurrent severe without psychotic features ([place]) 01/05/2016  . Cocaine abuse 01/05/2016  . Benzodiazepine abuse 01/05/2016  . Major depressive disorder, recurrent episode, severe, with psychosis ([place]) 01/05/2016  . Gout 07/06/2015  . Hep C w/o coma, chronic ([place]) 07/06/2015  . HTN (hypertension) 12/01/2014  . Elevated blood uric acid level 12/01/2014  . History of ETOH abuse 12/01/2014    Diagnosis: Alcohol use disorder, severe  Subjective: Mr. Harvard reports that he is doing better today and he feels his withdrawal symptoms are resolved. He denies any suicidal or homicidal ideation, plan or intent. He is now [facility] use and then hopefully pursuing a long-term program. We discussed that he appears to do very well in a program setting but after he leaves he tends to relapse. Mr. Jewitt acknowledges that this is so and that he should work on how he can better transition to a less restrictive environment and maintain his recovery. The plan is for him to be discharged early tomorrow morning to go to his programming and the patient is in agreement with this plan. He is tolerating his current medications well and denies any side effects.  Objective: Well developed well nourished white male in no apparent distress with poor dentition who is pleasant and cooperative. Speech and motor are within normal limits. Thought processes are linear and goal directed. Thought content he denies any suicidal or homicidal ideation, plan or intent and he denies any psychotic symptoms. Eye contact is good. He is alert and oriented 3. Attention and concentration and memory are good. IQ appears within the average range. Insight and judgment are good.  Review of systems is negative.         Current Facility-Administered  Medications (Analgesics):  .  acetaminophen (TYLENOL) tablet 650 mg .  ibuprofen (ADVIL,MOTRIN) tablet 600 mg  Current Outpatient Prescriptions (Analgesics):  .  ibuprofen (ADVIL,MOTRIN) 600 MG tablet, Take I tablet (600 mg) three times daily as needed: For pain    Current Facility-Administered Medications (Other):  .  alum & mag hydroxide-simeth (MAALOX/MYLANTA) 200-200-20 MG/5ML suspension 30 mL .  buPROPion (WELLBUTRIN SR) 12 hr tablet 300 mg .  feeding supplement (ENSURE ENLIVE) (ENSURE ENLIVE) liquid 237 mL .  magnesium hydroxide (MILK OF MAGNESIA) suspension 30 mL .  nicotine (NICODERM CQ - dosed in mg/24 hours) patch 21 mg .  ondansetron (ZOFRAN) tablet 4 mg .  zolpidem (AMBIEN) tablet 10 mg  Current Outpatient Prescriptions (Other):  Derrill Memo ON 01/11/2016] buPROPion (WELLBUTRIN SR) 150 MG 12 hr tablet, Take 2 tablets (300 mg total) by mouth daily. For depression .  [START ON 01/11/2016] nicotine (NICODERM CQ - DOSED IN MG/24 HOURS) 21 mg/24hr patch, Place 1 patch (21 mg total) onto the skin daily. For smoking cessation .  zolpidem (AMBIEN) 10 MG tablet, Take 1 tablet (10 mg total) by mouth at bedtime as needed for sleep.  Vital Signs:Blood pressure 105/76, pulse 71, temperature 98.3 F (36.8 C), temperature source Oral, resp. rate 17, height 5\' 2"  (1.575 m), weight 63.7 kg (140 lb 8 oz), SpO2 98 %.  Lab Results: No results found for this or any previous visit (from the past 48 hour(s)).  Physical Findings: AIMS: Facial and Oral Movements Muscles of Facial Expression: None, normal Lips and Perioral Area: None, normal Jaw: None,  normal Tongue: None, normal,Extremity Movements Upper (arms, wrists, hands, fingers): None, normal Lower (legs, knees, ankles, toes): None, normal, Trunk Movements Neck, shoulders, hips: None, normal, Overall Severity Severity of abnormal movements (highest score from questions above): None, normal Incapacitation due to abnormal movements: None,  normal Patient's awareness of abnormal movements (rate only patient's report): No Awareness, Dental Status Current problems with teeth and/or dentures?: No Does patient usually wear dentures?: No  CIWA:  CIWA-Ar Total: 3 COWS:      Assessment/Plan: Mr. Kozikowski denies any current withdrawal symptoms and if his present course continues he will continue his current medications and we will continue to monitor him monitor  daily until his planned discharge tomorrow morning.  Linard Millers, MD 01/10/2016, 12:00 PM

## 2016-01-11 NOTE — Progress Notes (Signed)
Patient ID: Edgar Mooney, male   DOB: 04-21-1965, 50 y.o.   MRN: [id number]  Pt denies SI/HI, denies hallucinations, denies pain, denies withdrawal symptoms.  AVS reviewed with pt.  Pt verbalizes understanding.  All belongings returned to pt.  He is going to his [place] appointment at [facility] via taxi.  Pt reports he is safe to discharge.  Denies needs and concerns.  Pt discharged in no acute distress.

## 2016-01-14 ENCOUNTER — Encounter (HOSPITAL_BASED_OUTPATIENT_CLINIC_OR_DEPARTMENT_OTHER): Payer: Self-pay | Admitting: Emergency Medicine

## 2016-01-14 ENCOUNTER — Inpatient Hospital Stay (HOSPITAL_BASED_OUTPATIENT_CLINIC_OR_DEPARTMENT_OTHER)
Admission: EM | Admit: 2016-01-14 | Discharge: 2016-01-16 | DRG: 813 | Disposition: A | Discharge status: Home / Self Care | Payer: PRIVATE HEALTH INSURANCE | Attending: Internal Medicine | Admitting: Internal Medicine

## 2016-01-14 DIAGNOSIS — B182 Chronic viral hepatitis C: Secondary | ICD-10-CM

## 2016-01-14 DIAGNOSIS — F1410 Cocaine abuse, uncomplicated: Secondary | ICD-10-CM | POA: Diagnosis present

## 2016-01-14 DIAGNOSIS — F1910 Other psychoactive substance abuse, uncomplicated: Secondary | ICD-10-CM | POA: Diagnosis not present

## 2016-01-14 DIAGNOSIS — D693 Immune thrombocytopenic purpura: Secondary | ICD-10-CM | POA: Diagnosis present

## 2016-01-14 DIAGNOSIS — F333 Major depressive disorder, recurrent, severe with psychotic symptoms: Secondary | ICD-10-CM

## 2016-01-14 DIAGNOSIS — F17210 Nicotine dependence, cigarettes, uncomplicated: Secondary | ICD-10-CM | POA: Diagnosis present

## 2016-01-14 DIAGNOSIS — F32A Depression, unspecified: Secondary | ICD-10-CM

## 2016-01-14 DIAGNOSIS — B1920 Unspecified viral hepatitis C without hepatic coma: Secondary | ICD-10-CM

## 2016-01-14 DIAGNOSIS — F1010 Alcohol abuse, uncomplicated: Secondary | ICD-10-CM | POA: Diagnosis present

## 2016-01-14 DIAGNOSIS — M109 Gout, unspecified: Secondary | ICD-10-CM | POA: Diagnosis present

## 2016-01-14 DIAGNOSIS — F329 Major depressive disorder, single episode, unspecified: Secondary | ICD-10-CM

## 2016-01-14 LAB — LACTATE DEHYDROGENASE: LDH: 149 U/L (ref 98–192)

## 2016-01-14 LAB — CBC WITH DIFFERENTIAL/PLATELET
Basophils Absolute: 0 10*3/uL (ref 0.0–0.1)
Basophils Relative: 1 %
Eosinophils Absolute: 0.1 10*3/uL (ref 0.0–0.7)
Eosinophils Relative: 2 %
HCT: 42.5 % (ref 39.0–52.0)
Hemoglobin: 14.6 g/dL (ref 13.0–17.0)
Lymphocytes Relative: 25 %
Lymphs Abs: 1.6 10*3/uL (ref 0.7–4.0)
MCH: 30.2 pg (ref 26.0–34.0)
MCHC: 34.4 g/dL (ref 30.0–36.0)
MCV: 88 fL (ref 78.0–100.0)
Monocytes Absolute: 0.7 10*3/uL (ref 0.1–1.0)
Monocytes Relative: 11 %
Neutro Abs: 3.9 10*3/uL (ref 1.7–7.7)
Neutrophils Relative %: 61 %
Platelets: <5 10*3/uL — CL (ref 150–400)
RBC: 4.83 MIL/uL (ref 4.22–5.81)
RDW: 13.3 % (ref 11.5–15.5)
WBC: 6.4 10*3/uL (ref 4.0–10.5)

## 2016-01-14 LAB — COMPREHENSIVE METABOLIC PANEL
ALT: 32 U/L (ref 17–63)
AST: 29 U/L (ref 15–41)
Albumin: 3.7 g/dL (ref 3.5–5.0)
Alkaline Phosphatase: 78 U/L (ref 38–126)
Anion gap: 7 (ref 5–15)
BUN: 17 mg/dL (ref 6–20)
CO2: 24 mmol/L (ref 22–32)
Calcium: 9 mg/dL (ref 8.9–10.3)
Chloride: 105 mmol/L (ref 101–111)
Creatinine, Ser: 0.84 mg/dL (ref 0.61–1.24)
GFR calc Af Amer: >60 mL/min (ref >60)
GFR calc non Af Amer: >60 mL/min (ref >60)
Glucose, Bld: 86 mg/dL (ref 65–99)
Potassium: 4.2 mmol/L (ref 3.5–5.1)
Sodium: 136 mmol/L (ref 135–145)
Total Bilirubin: 1.1 mg/dL (ref 0.3–1.2)
Total Protein: 7.3 g/dL (ref 6.5–8.1)

## 2016-01-14 LAB — PROTIME-INR
INR: 1
Prothrombin Time: 13.2 seconds (ref 11.4–15.2)

## 2016-01-14 MED ORDER — METHYLPREDNISOLONE SODIUM SUCC 125 MG IJ SOLR
80.0000 mg | Freq: Every day | INTRAMUSCULAR | Status: DC
  Administered 2016-01-15 – 2016-01-16 (×2): 80 mg via INTRAVENOUS
  Filled 2016-01-14 (×2): qty 2

## 2016-01-14 MED ORDER — ONDANSETRON HCL 4 MG PO TABS: 4.0000 mg | ORAL_TABLET | Freq: Four times a day (QID) | ORAL | Status: DC | PRN

## 2016-01-14 MED ORDER — IMMUNE GLOBULIN (HUMAN) 10 GM/100ML IV SOLN
1.0000 g/kg | INTRAVENOUS | Status: AC
Start: 2016-01-14 — End: 2016-01-15
  Administered 2016-01-14 – 2016-01-15 (×2): 65 g via INTRAVENOUS
  Filled 2016-01-14 (×2): qty 600

## 2016-01-14 MED ORDER — HYDROCODONE-ACETAMINOPHEN 5-325 MG PO TABS: 1.0000 | ORAL_TABLET | ORAL | Status: DC | PRN

## 2016-01-14 MED ORDER — BUPROPION HCL ER (SR) 150 MG PO TB12
300.0000 mg | ORAL_TABLET | Freq: Every day | ORAL | Status: DC
  Administered 2016-01-15 – 2016-01-16 (×2): 300 mg via ORAL
  Filled 2016-01-14 (×2): qty 2

## 2016-01-14 MED ORDER — SODIUM CHLORIDE 0.9% FLUSH
3.0000 mL | Freq: Two times a day (BID) | INTRAVENOUS | Status: DC
  Administered 2016-01-15 – 2016-01-16 (×2): 3 mL via INTRAVENOUS

## 2016-01-14 MED ORDER — ENSURE ENLIVE PO LIQD
237.0000 mL | Freq: Two times a day (BID) | ORAL | Status: DC
  Administered 2016-01-15 (×2): 237 mL via ORAL

## 2016-01-14 MED ORDER — ACETAMINOPHEN 325 MG PO TABS: 650.0000 mg | ORAL_TABLET | Freq: Four times a day (QID) | ORAL | Status: DC | PRN

## 2016-01-14 MED ORDER — ONDANSETRON HCL 4 MG/2ML IJ SOLN: 4.0000 mg | Freq: Four times a day (QID) | INTRAMUSCULAR | Status: DC | PRN

## 2016-01-14 MED ORDER — METHYLPREDNISOLONE SODIUM SUCC 125 MG IJ SOLR
80.0000 mg | Freq: Once | INTRAMUSCULAR | Status: AC
  Administered 2016-01-14: 80 mg via INTRAVENOUS
  Filled 2016-01-14: qty 2

## 2016-01-14 MED ORDER — ACETAMINOPHEN 650 MG RE SUPP: 650.0000 mg | Freq: Four times a day (QID) | RECTAL | Status: DC | PRN

## 2016-01-14 NOTE — Consult Note (Signed)
New Hematology/Oncology Consult   Referral MD: Dr. Wynetta Emery       Reason for Referral: Severe thrombocytopenia    HPI: Edgar Mooney [facility] [place] [facility] room today with a 2 day history of a lower extremity rash and "blisters "in the mouth. A CBC was remarkable for a platelet count of less than 5000. He was transferred to [facility] for further evaluation. He reports no previous history of thrombocytopenia. He feels well. He currently resides in a drug rehabilitation facility. He reports no recent infection. He has untreated hepatitis C. He last used IV cocaine approximately 2 weeks ago.    Past Medical History:  Diagnosis Date  . Alcohol abuse   . Cocaine abuse   . Depression   . Gout   . Hep C w/o coma, chronic ([place]) diagnosed May 2016  Past surgical history: None   No current facility-administered medications for this encounter. :  :  No Known Allergies:  Family History  Problem Relation Age of Onset  . Ovarian cancer  Mother   . Alcohol use and cirrhosis  Father   :  Social History   Social History  . Marital status: Single    Spouse name: N/A  . Number of children: 2  . Years of education: N/A   Occupational History  . Previously worked in Architect    Social History Main Topics  . Smoking status: Current Every Day Smoker    Packs/day: 1.00    Years: 25.00    Types: Cigarettes  . Smokeless tobacco: Never Used  . Alcohol use 75.6 oz/week    126 Cans of beer per week     Comment: Every day  . Drug use:     Types: Cocaine, IV, Heroin, Marijuana     Comment: last used 2 weeks cocaine ago.  last used heroine about 2 weeks ago.  Marland Kitchen Sexual activity: Not Currently    Review of Systems:  Positives include:"Rash "at the lower legs, blisters in the mouth, weight loss while using cocaine  A complete ROS was otherwise negative.   Physical Exam:  Blood pressure (!) 155/94, pulse 65, temperature 98.1 F (36.7 C), temperature  source Oral, resp. rate 16, height 5\' 2"  (1.575 m), weight 141 lb 12.1 oz (64.3 kg), SpO2 97 %.  HEENT: Neck without mass, oropharynx without visible mass, petechiae and ecchymoses at the buccal mucosa and palate, edentulous, no thrush Lungs: Clear bilaterally Cardiac: Regular rate and rhythm Abdomen: No hepatomegaly, nontender, no mass GU: Testes without mass  Vascular: No leg edema Lymph nodes: No cervical, supraclavicular, axillary, or inguinal nodes Neurologic: Alert and oriented, the motor exam appears grossly intact Skin: Petechial rash at the lower leg bilaterally, yeast rash with petechiae in the left groin, soft verruca-type lesion in the right groin Musculoskeletal: No spine tenderness  LABS:   Recent Labs  01/14/16 1215  WBC 6.4  HGB 14.6  HCT 42.5  PLT <5*  Peripheral blood smear: The platelets are markedly decreased in number, no clumps-rare platelets seen. The white cell morphology is unremarkable. The red cell morphology is unremarkable. The polychromasia is not increased.   Recent Labs  01/14/16 1215  NA 136  K 4.2  CL 105  CO2 24  GLUCOSE 86  BUN 17  CREATININE 0.84  CALCIUM 9.0       Assessment and Plan:   1. Severe thrombocytopenia 2. Hepatitis C positive 3. Polysubstance abuse 4. Depression 5. History of gout  Edgar Mooney is admitted with severe thrombocytopenia. He has mouth and skin bleeding. 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 There is no clinical evidence for an associated malignancy or rheumatologic condition. The ITP may be associated with hepatitis C.  I recommend continuing Solu-Medrol. I will add IVIG. I reviewed the risks associated with IVIG including the chance for an allergic reaction and viral transmission. He agrees to proceed.   Recommendations:  1. Continue Solu-Medrol-1 mg/kg daily 2. IVIG 1 g/kg daily for 3 days 3. Check HIV serology 4. Refer for treatment of hepatitis C 5. Daily CBC    Betsy Coder, MD 01/14/2016, 6:32 PM

## 2016-01-14 NOTE — ED Notes (Signed)
Per pt's request, contacted [facility] and let them know that pt was to be admitted to [facility] hospital.

## 2016-01-14 NOTE — ED Triage Notes (Signed)
Pt c/o non-painful mouth lesions for past few days.  States a few filled up with blood like blisters, then popped and new black spots are popping up on tongue and insides of cheeks.

## 2016-01-14 NOTE — ED Notes (Signed)
Pt also c/o non painful rash to right leg for past few days.

## 2016-01-14 NOTE — ED Notes (Signed)
Pt's lab smear sent with [facility] per MD request.

## 2016-01-14 NOTE — H&P (Signed)
History and Physical    [name] [id number] DOB: October 21, 1965 DOA: 01/14/2016  PCP: No primary care provider on file.   Patient coming from: [place]  Chief Complaint: Bleeding blisters in his mouth  HPI: Edgar Mooney is a 50 y.o. gentleman with history of chronic hepatitis C infection (he has never been treated), polysubstance abuse, and depression who has been in a detox/rehab facility for the past week.  On Wednesday, he developed epistaxis.  On Friday, he developed bleeding blisters in his mouth.  He requested transfer to the ED in [facility] for evaluation.  Labs notable for platelet count of less than 5.    ED Course: Case was discussed with Dr. Benay Mooney, on call for heme/onc.  IV solumedrol 80mg  given one time and the patient was transferred to [facility] for admission.  The patient is developing a petechial rash on his back, chest, abdomen, and bilateral lower extremities.  He denies hemoptysis, hematemesis, BRBPR, or hematuria.  No chest pain, shortness of breath, light-headedness, dizziness, or headache.  Review of Systems: As per HPI otherwise 10 point review of systems negative.    Past Medical History:  Diagnosis Date  . Alcohol abuse   . Cocaine abuse   . Depression   . Gout   . Hep C w/o coma, chronic [facility]) diagnosed May 2016    History reviewed. No pertinent surgical history.   reports that he has been smoking Cigarettes.  He has a 25.00 pack-year smoking history. He has never used smokeless tobacco. He reports that he drinks about 75.6 oz of alcohol per week . He reports that he uses drugs, including Cocaine, IV, Heroin, and Marijuana.  The patient reports that he has been clean for about two weeks.  No Known Allergies  Family History  Problem Relation Age of Onset  . Cancer Mother   . Cancer Father   Both parents are deceased.  Mother had ovarian cancer.  He does not know what type of cancer his father had.   Prior to Admission  medications   Medication Sig Start Date End Date Taking? Authorizing Provider  buPROPion (WELLBUTRIN SR) 150 MG 12 hr tablet Take 2 tablets (300 mg total) by mouth daily. For depression 01/11/16  Yes Encarnacion Slates, NP  ibuprofen (ADVIL,MOTRIN) 600 MG tablet Take I tablet (600 mg) three times daily as needed: For pain 01/10/16  Yes Encarnacion Slates, NP  nicotine (NICODERM CQ - DOSED IN MG/24 HOURS) 21 mg/24hr patch Place 1 patch (21 mg total) onto the skin daily. For smoking cessation 01/11/16  Yes Encarnacion Slates, NP  zolpidem (AMBIEN) 10 MG tablet Take 1 tablet (10 mg total) by mouth at bedtime as needed for sleep. 01/10/16 02/09/16 Yes Encarnacion Slates, NP    Physical Exam: Vitals:   01/14/16 1338 01/14/16 1543 01/14/16 1616 01/14/16 1800  BP: 152/98 144/85 157/94 (!) 155/94  Pulse: 71 68 66 65  Resp: 16 22 16    Temp:    98.1 F (36.7 C)  TempSrc:    Oral  SpO2: 98% 97% 99% 97%  Weight:    64.3 kg (141 lb 12.1 oz)  Height:    5\' 2"  (1.575 m)      Constitutional: NAD, calm, comfortable, NONtoxic appearing Vitals:   01/14/16 1338 01/14/16 1543 01/14/16 1616 01/14/16 1800  BP: 152/98 144/85 157/94 (!) 155/94  Pulse: 71 68 66 65  Resp: 16 22 16    Temp:    98.1  F (36.7 C)  TempSrc:    Oral  SpO2: 98% 97% 99% 97%  Weight:    64.3 kg (141 lb 12.1 oz)  Height:    5\' 2"  (1.575 m)   Eyes: PERRL, lids and conjunctivae normal ENMT: Mucous membranes are moist. Normal dentition.  Multiple dark blisters noted in his mouth. Neck: normal appearance, supple Respiratory: clear to auscultation bilaterally, no wheezing, no crackles. Normal respiratory effort. No accessory muscle use.  Cardiovascular: Normal rate, regular rhythm, no murmurs / rubs / gallops. No extremity edema. 2+ pedal pulses. GI: abdomen is soft and compressible.  No distention.  No tenderness.  No masses palpated.  Bowel sounds are present. Musculoskeletal:  No joint deformity in upper and lower extremities. Good ROM, no contractures.  Normal muscle tone.  Skin: Diffuse petechial rash.  Skin is warn and dry. Neurologic: No focal deficits. Psychiatric: Normal judgment and insight. Alert and oriented x 3. Normal mood.     Labs on Admission: I have personally reviewed following labs and imaging studies  CBC:  Recent Labs Lab 01/14/16 1215  WBC 6.4  NEUTROABS 3.9  HGB 14.6  HCT 42.5  MCV 88.0  PLT <5*   Basic Metabolic Panel:  Recent Labs Lab 01/14/16 1215  NA 136  K 4.2  CL 105  CO2 24  GLUCOSE 86  BUN 17  CREATININE 0.84  CALCIUM 9.0   GFR: Estimated Creatinine Clearance: 81.3 mL/min (by C-G formula based on SCr of 0.84 mg/dL). Liver Function Tests:  Recent Labs Lab 01/14/16 1215  AST 29  ALT 32  ALKPHOS 78  BILITOT 1.1  PROT 7.3  ALBUMIN 3.7   Coagulation Profile:  Recent Labs Lab 01/14/16 1215  INR 1.00    Assessment/Plan Principal Problem:   ITP (idiopathic thrombocytopenic purpura) Active Problems:   Hep C w/o coma, chronic (HCC)   Major depressive disorder, recurrent episode, severe, with psychosis ([place])   Polysubstance abuse      ITP --Dr. Benay Mooney has already seen the patient.  Heme/onc input greatly appreciated.  He will write orders for IVIG. --Continue solumedrol 80mg  IV daily, next dose tomorrow --Daily CBC --Check LDH and HIV antibody  Chronic hep C --Normal LFTs noted, normal INR noted  History of polysubstance abuse --Patient declined nicotine patch --Consider BH consult while he is here  History of depression --Continue home dose of wellbutrin   DVT prophylaxis: SCDs  Code Status: FULL Family Communication: Patient alone at time of admission Disposition Plan: To be determined Consults called: Heme/Onc Dr. Benay Mooney Admission status: Inpatient, telemetry   TIME SPENT: 67 minutes   Eber Jones MD [facility] Pager [phone]  If 7PM-7AM, please contact night-coverage [email or web address] Password [facility]  01/14/2016, 7:39 PM

## 2016-01-15 DIAGNOSIS — D693 Immune thrombocytopenic purpura: Principal | ICD-10-CM

## 2016-01-15 DIAGNOSIS — F3289 Other specified depressive episodes: Secondary | ICD-10-CM

## 2016-01-15 DIAGNOSIS — F329 Major depressive disorder, single episode, unspecified: Secondary | ICD-10-CM

## 2016-01-15 DIAGNOSIS — F32A Depression, unspecified: Secondary | ICD-10-CM

## 2016-01-15 DIAGNOSIS — B1920 Unspecified viral hepatitis C without hepatic coma: Secondary | ICD-10-CM | POA: Diagnosis not present

## 2016-01-15 LAB — CBC
HCT: 38.8 % — ABNORMAL LOW (ref 39.0–52.0)
Hemoglobin: 13.7 g/dL (ref 13.0–17.0)
MCH: 30.6 pg (ref 26.0–34.0)
MCHC: 35.3 g/dL (ref 30.0–36.0)
MCV: 86.6 fL (ref 78.0–100.0)
Platelets: 5 10*3/uL — CL (ref 150–400)
RBC: 4.48 MIL/uL (ref 4.22–5.81)
RDW: 12.8 % (ref 11.5–15.5)
WBC: 9.9 10*3/uL (ref 4.0–10.5)

## 2016-01-15 LAB — HIV ANTIBODY (ROUTINE TESTING W REFLEX): HIV Screen 4th Generation wRfx: NONREACTIVE

## 2016-01-15 NOTE — Progress Notes (Signed)
Patient ID: Edgar Mooney, male   DOB: [birth year], 50 y.o.   MRN: [id number]  PROGRESS NOTE    Edgar Mooney  [id number] DOB: 1966/02/03 DOA: 01/14/2016  PCP: No primary care provider on file.   Brief Narrative:  50 year old male with past medical history of chronic hepatitis C, never treated, polysubstance abuse, depression. Patient has been in detox facility for past week. He subsequently developed epistaxis and bleeding blisters in the mouth. He was transferred to [facility] ED for evaluation and was found to have platelet count of less than 5. Patient seen by hematology consultation who recommended IV steroids and IVIG.  Assessment & Plan:   Principal Problem:   Idiopathic thrombocytopenic purpura ([place]) - Per oncology, continue IV Solu-Medrol and IVIG (started 9/30)  Active Problems:   Depression - Stable. Continue Wellbutrin   DVT prophylaxis: SCDs bilaterally Code Status: full code  Family Communication: No family at the bedside Disposition Plan: Home when platelet count at least 20 or above   Consultants:   Hematology, Dr. Benay Spice   Procedures:   None   Antimicrobials:   None    Subjective: No overnight events.  Objective: Vitals:   01/14/16 2342 01/15/16 0005 01/15/16 0231 01/15/16 0300  BP: 110/67 119/68 127/65 119/60  Pulse: 75 71 66 69  Resp: 16 16 16 17   Temp: 97.7 F (36.5 C) 97.9 F (36.6 C) 97.4 F (36.3 C) 97.8 F (36.6 C)  TempSrc: Oral Oral Oral Oral  SpO2: 98% 95% 97% 95%  Weight:      Height:        Intake/Output Summary (Last 24 hours) at 01/15/16 1013 Last data filed at 01/14/16 2307  Gross per 24 hour  Intake              650 ml  Output              375 ml  Net              275 ml   Filed Weights   01/14/16 1136 01/14/16 1800  Weight: 63.5 kg (140 lb) 64.3 kg (141 lb 12.1 oz)    Examination:  General exam: Appears calm and comfortable  Respiratory system: Clear to auscultation. Respiratory effort  normal. Cardiovascular system: S1 & S2 heard, RRR. No JVD, murmurs, rubs, gallops or clicks. No pedal edema. Gastrointestinal system: Abdomen is nondistended, soft and nontender. No organomegaly or masses felt. Normal bowel sounds heard. Central nervous system: Alert and oriented. No focal neurological deficits. Extremities: Symmetric 5 x 5 power. Skin: No rashes, lesions or ulcers Psychiatry: Judgement and insight appear normal. Mood & affect appropriate.   Data Reviewed: I have personally reviewed following labs and imaging studies  CBC:  Recent Labs Lab 01/14/16 1215 01/15/16 0526  WBC 6.4 9.9  NEUTROABS 3.9  --   HGB 14.6 13.7  HCT 42.5 38.8*  MCV 88.0 86.6  PLT <5* 5*   Basic Metabolic Panel:  Recent Labs Lab 01/14/16 1215  NA 136  K 4.2  CL 105  CO2 24  GLUCOSE 86  BUN 17  CREATININE 0.84  CALCIUM 9.0   GFR: Estimated Creatinine Clearance: 81.3 mL/min (by C-G formula based on SCr of 0.84 mg/dL). Liver Function Tests:  Recent Labs Lab 01/14/16 1215  AST 29  ALT 32  ALKPHOS 78  BILITOT 1.1  PROT 7.3  ALBUMIN 3.7   No results for input(s): LIPASE, AMYLASE in the last 168 hours. No results for input(s): AMMONIA  in the last 168 hours. Coagulation Profile:  Recent Labs Lab 01/14/16 1215  INR 1.00   Cardiac Enzymes: No results for input(s): CKTOTAL, CKMB, CKMBINDEX, TROPONINI in the last 168 hours. BNP (last 3 results) No results for input(s): PROBNP in the last 8760 hours. HbA1C: No results for input(s): HGBA1C in the last 72 hours. CBG: No results for input(s): GLUCAP in the last 168 hours. Lipid Profile: No results for input(s): CHOL, HDL, LDLCALC, TRIG, CHOLHDL, LDLDIRECT in the last 72 hours. Thyroid Function Tests: No results for input(s): TSH, T4TOTAL, FREET4, T3FREE, THYROIDAB in the last 72 hours. Anemia Panel: No results for input(s): VITAMINB12, FOLATE, FERRITIN, TIBC, IRON, RETICCTPCT in the last 72 hours. Urine analysis: No  results found for: COLORURINE, APPEARANCEUR, LABSPEC, PHURINE, GLUCOSEU, HGBUR, BILIRUBINUR, KETONESUR, PROTEINUR, UROBILINOGEN, NITRITE, LEUKOCYTESUR Sepsis Labs: @LABRCNTIP (procalcitonin:4,lacticidven:4)   )No results found for this or any previous visit (from the past 240 hour(s)).    Radiology Studies: No results found.    Scheduled Meds: . buPROPion  300 mg Oral Daily  . feeding supplement (ENSURE ENLIVE)  237 mL Oral BID BM  . IMMUNE GLOBULIN 10% (HUMAN) IV - For Fluid Restriction Only  1 g/kg Intravenous Q24H  . methylPREDNISolone (SOLU-MEDROL) injection  80 mg Intravenous Daily  . sodium chloride flush  3 mL Intravenous Q12H   Continuous Infusions:    LOS: 1 day    Time spent: 15 minutes  Greater than 50% of the time spent on counseling and coordinating the care.   Leisa Lenz, MD [facility] Pager [phone]  If 7PM-7AM, please contact night-coverage [email or web address] Password [email or web address] 01/15/2016, 10:13 AM

## 2016-01-15 NOTE — ED Provider Notes (Signed)
[place] DEPT Provider Note   CSN: [id number] Arrival date & time: 01/14/16  1130     History   Chief Complaint Chief Complaint  Patient presents with  . Mouth Lesions  . Rash    HPI MATTHEWS Edgar Mooney is a 50 y.o. male.  Patient is a 50 year old male with history of hepatitis C and substance abuse. He presents for evaluation of rash to his legs and developing blood blisters to the inside of his mouth. He denies any injury or trauma.   The history is provided by the patient.  Mouth Lesions  Location:  Buccal mucosa and tongue Quality:  Blistered and red Onset quality:  Sudden Severity:  Moderate Duration:  2 days Progression:  Worsening Chronicity:  New Relieved by:  Nothing Worsened by:  Nothing Associated symptoms: rash   Rash      Past Medical History:  Diagnosis Date  . Alcohol abuse   . Cocaine abuse   . Depression   . Gout   . Hep C w/o coma, chronic [facility]) diagnosed May 2016    Patient Active Problem List   Diagnosis Date Noted  . Depression   . Idiopathic thrombocytopenic purpura ([place]) 01/14/2016  . Polysubstance abuse 01/14/2016  . Major depressive disorder, recurrent severe without psychotic features ([place]) 01/05/2016  . Cocaine abuse 01/05/2016  . Benzodiazepine abuse 01/05/2016  . Major depressive disorder, recurrent episode, severe, with psychosis ([place]) 01/05/2016  . Gout 07/06/2015  . Hep C w/o coma, chronic ([place]) 07/06/2015  . HTN (hypertension) 12/01/2014  . Elevated blood uric acid level 12/01/2014  . History of ETOH abuse 12/01/2014    History reviewed. No pertinent surgical history.     Home Medications    Prior to Admission medications   Medication Sig Start Date End Date Taking? Authorizing Provider  buPROPion (WELLBUTRIN SR) 150 MG 12 hr tablet Take 2 tablets (300 mg total) by mouth daily. For depression 01/11/16  Yes Encarnacion Slates, NP  ibuprofen (ADVIL,MOTRIN) 600 MG tablet Take I tablet (600 mg) three times daily as  needed: For pain 01/10/16  Yes Encarnacion Slates, NP  nicotine (NICODERM CQ - DOSED IN MG/24 HOURS) 21 mg/24hr patch Place 1 patch (21 mg total) onto the skin daily. For smoking cessation 01/11/16  Yes Encarnacion Slates, NP  zolpidem (AMBIEN) 10 MG tablet Take 1 tablet (10 mg total) by mouth at bedtime as needed for sleep. 01/10/16 02/09/16 Yes Encarnacion Slates, NP    Family History Family History  Problem Relation Age of Onset  . Cancer Mother   . Cancer Father     Social History Social History  Substance Use Topics  . Smoking status: Current Every Day Smoker    Packs/day: 1.00    Years: 25.00    Types: Cigarettes  . Smokeless tobacco: Never Used  . Alcohol use 75.6 oz/week    126 Cans of beer per week     Comment: Every day     Allergies   Review of patient's allergies indicates no known allergies.   Review of Systems Review of Systems  HENT: Positive for mouth sores.   Skin: Positive for rash.  All other systems reviewed and are negative.    Physical Exam Updated Vital Signs BP 114/63 (BP Location: Left Arm)   Pulse 72   Temp 98.5 F (36.9 C) (Oral)   Resp 20   Ht 5\' 2"  (1.575 m)   Wt 141 lb 12.1 oz (64.3 kg)   SpO2  96%   BMI 25.93 kg/m   Physical Exam  Constitutional: He is oriented to person, place, and time. He appears well-developed and well-nourished. No distress.  HENT:  Head: Normocephalic and atraumatic.  There are multiple blood filled blisters to the inside of the mouth and tongue.  Neck: Normal range of motion. Neck supple.  Cardiovascular: Normal rate and regular rhythm.  Exam reveals no friction rub.   No murmur heard. Pulmonary/Chest: Effort normal and breath sounds normal. No respiratory distress. He has no wheezes. He has no rales.  Abdominal: Soft. Bowel sounds are normal. He exhibits no distension. There is no tenderness.  Musculoskeletal: Normal range of motion. He exhibits no edema.  Neurological: He is alert and oriented to person, place, and  time. Coordination normal.  Skin: Skin is warm and dry. He is not diaphoretic.  There are petechial lesions to both lower extremities in a stocking distribution.  Nursing note and vitals reviewed.    ED Treatments / Results  Labs (all labs ordered are listed, but only abnormal results are displayed) Labs Reviewed  CBC WITH DIFFERENTIAL/PLATELET - Abnormal; Notable for the following:       Result Value   Platelets <5 (*)    All other components within normal limits  CBC - Abnormal; Notable for the following:    HCT 38.8 (*)    Platelets 5 (*)    All other components within normal limits  COMPREHENSIVE METABOLIC PANEL  PROTIME-INR  LACTATE DEHYDROGENASE  HIV ANTIBODY (ROUTINE TESTING)    EKG  EKG Interpretation None       Radiology No results found.  Procedures Procedures (including critical care time)  Medications Ordered in ED Medications  Immune Globulin 10% (PRIVIGEN) IV infusion 65 g (65 g Intravenous New Bag/Given 01/14/16 2307)  methylPREDNISolone sodium succinate (SOLU-MEDROL) 125 mg/2 mL injection 80 mg (80 mg Intravenous Given 01/15/16 1049)  feeding supplement (ENSURE ENLIVE) (ENSURE ENLIVE) liquid 237 mL (not administered)  buPROPion (WELLBUTRIN SR) 12 hr tablet 300 mg (300 mg Oral Given 01/15/16 1049)  sodium chloride flush (NS) 0.9 % injection 3 mL (3 mLs Intravenous Not Given 01/14/16 2200)  acetaminophen (TYLENOL) tablet 650 mg (not administered)    Or  acetaminophen (TYLENOL) suppository 650 mg (not administered)  HYDROcodone-acetaminophen (NORCO/VICODIN) 5-325 MG per tablet 1-2 tablet (not administered)  ondansetron (ZOFRAN) tablet 4 mg (not administered)    Or  ondansetron (ZOFRAN) injection 4 mg (not administered)  methylPREDNISolone sodium succinate (SOLU-MEDROL) 125 mg/2 mL injection 80 mg (80 mg Intravenous Given 01/14/16 1407)     Initial Impression / Assessment and Plan / ED Course  I have reviewed the triage vital signs and the nursing  notes.  Pertinent labs & imaging results that were available during my care of the patient were reviewed by me and considered in my medical decision making (see chart for details).  Clinical Course    Oral mucosa reveals multiple blood blisters along with a petechial rash to both legs. Blood work reveals a platelet count of less than 5. The remainder of his blood counts are unremarkable. I suspect this represents ITP. I've discussed this finding with Dr. Learta Codding who recommends steroids and admission to the hospitalist service. I've spoken with Dr. Wynetta Emery who agrees to admit.  Final Clinical Impressions(s) / ED Diagnoses   Final diagnoses:  ITP (idiopathic thrombocytopenic purpura)    New Prescriptions Current Discharge Medication List       Veryl Speak, MD 01/15/16 [phone]

## 2016-01-15 NOTE — Progress Notes (Signed)
IP PROGRESS NOTE  Subjective:   No complaint. He tolerated the IVIG well. No bleeding.  Objective: Vital signs in last 24 hours: Blood pressure 119/60, pulse 69, temperature 97.8 F (36.6 C), temperature source Oral, resp. rate 17, height 5\' 2"  (1.575 m), weight 141 lb 12.1 oz (64.3 kg), SpO2 95 %.  Intake/Output from previous day: 09/30 0701 - 10/01 0700 In: 650 [I.V.:650] Out: 375 [Urine:375]  Physical Exam:  HEENT: Small ecchymoses at the buccal mucosa and tongue-improved. No active bleeding. Skin: Petechial rash over the legs  Lab Results:  Recent Labs  01/14/16 1215 01/15/16 0526  WBC 6.4 9.9  HGB 14.6 13.7  HCT 42.5 38.8*  PLT <5* 5*    BMET  Recent Labs  01/14/16 1215  NA 136  K 4.2  CL 105  CO2 24  GLUCOSE 86  BUN 17  CREATININE 0.84  CALCIUM 9.0   LDH 149   Medications: I have reviewed the patient's current medications.  Assessment/Plan:  1. Severe thrombocytopenia-likely secondary to ITP  Initiation of sign U Medrol and IVIG on 01/14/2016 2. Hepatitis C positive 3. Polysubstance abuse 4. Depression 5. History of gout  He appears to have ITP. The platelet count is slightly improved today.  Recommendations: 1. Continue daily Solu-Medrol 2. Day #2 IVIG today 3. Daily CBC   LOS: 1 day   Betsy Coder, MD   01/15/2016, 8:24 AM

## 2016-01-16 ENCOUNTER — Other Ambulatory Visit: Payer: Self-pay | Admitting: *Deleted

## 2016-01-16 DIAGNOSIS — F3289 Other specified depressive episodes: Secondary | ICD-10-CM | POA: Diagnosis not present

## 2016-01-16 DIAGNOSIS — D693 Immune thrombocytopenic purpura: Secondary | ICD-10-CM | POA: Diagnosis not present

## 2016-01-16 DIAGNOSIS — E790 Hyperuricemia without signs of inflammatory arthritis and tophaceous disease: Secondary | ICD-10-CM

## 2016-01-16 DIAGNOSIS — B1920 Unspecified viral hepatitis C without hepatic coma: Secondary | ICD-10-CM | POA: Diagnosis not present

## 2016-01-16 LAB — CBC
HCT: 38.2 % — ABNORMAL LOW (ref 39.0–52.0)
Hemoglobin: 13.2 g/dL (ref 13.0–17.0)
MCH: 30.6 pg (ref 26.0–34.0)
MCHC: 34.6 g/dL (ref 30.0–36.0)
MCV: 88.6 fL (ref 78.0–100.0)
Platelets: 40 10*3/uL — ABNORMAL LOW (ref 150–400)
RBC: 4.31 MIL/uL (ref 4.22–5.81)
RDW: 13.2 % (ref 11.5–15.5)
WBC: 10.2 10*3/uL (ref 4.0–10.5)

## 2016-01-16 LAB — PATHOLOGIST SMEAR REVIEW

## 2016-01-16 MED ORDER — PREDNISONE 20 MG PO TABS: 60.0000 mg | ORAL_TABLET | Freq: Every day | ORAL | 0 refills | Status: DC

## 2016-01-16 MED ORDER — ACETAMINOPHEN 325 MG PO TABS: 650.0000 mg | ORAL_TABLET | Freq: Four times a day (QID) | ORAL | 0 refills | Status: DC | PRN

## 2016-01-16 MED ORDER — BOOST / RESOURCE BREEZE PO LIQD: 1.0000 | Freq: Three times a day (TID) | ORAL | Status: DC

## 2016-01-16 MED ORDER — PREDNISONE 20 MG PO TABS: 60.0000 mg | ORAL_TABLET | Freq: Every day | ORAL | Status: DC

## 2016-01-16 MED ORDER — BOOST / RESOURCE BREEZE PO LIQD: 1.0000 | Freq: Three times a day (TID) | ORAL | 0 refills | Status: DC

## 2016-01-16 NOTE — Discharge Instructions (Signed)
Idiopathic Thrombocytopenic Purpura °Idiopathic thrombocytopenic purpura (ITP) is a disease in which your body's defense system (immune system) attacks your platelets. Platelets are blood cells that help form clots and seal leaks in damaged blood vessels. With ITP, you to have too few platelets. As a result, you bleed more easily. It is also harder for your body to stop any bleeding. °In adults, ITP is usually a long-term disease. °CAUSES °The cause is unknown. ITP may develop after a viral infection, during pregnancy, or from an immune disorder. °RISK FACTORS °The risk of ITP may be greater among: °· Women. °· Adults 20-50 years old. °SIGNS AND SYMPTOMS °Common signs and symptoms include: °· Easy bruising. °· A cut that bleeds for a long time. °· Tiny purple blood dots (petechiae) under the skin, especially on the shins. °· Blood in urine or bowel movements. °· Nosebleeds. °· Bleeding gums. °· Heavy menstrual periods. °Mild forms of ITP may not cause any symptoms. °DIAGNOSIS °Your health care provider may suspect ITP based on your signs and symptoms. To make a diagnosis, your health care provider may do a physical exam and order blood tests to: °· Find how many platelets you have. °· See how well your blood clots. °Your health care provider may then do blood tests or a bone marrow test to rule out other conditions that could be causing your symptoms. °TREATMENT °Treatment depends on the severity of your condition. Options include: °· Monitoring of your condition and platelet count. °· Blood transfusions of antibodies or platelets. °· Medicines such as: °¨ Strong anti-inflammatory medicines (steroids). °¨ Medicines to boost platelet production. °¨ Medicines to suppress your immune system. °· Removal of your spleen. This may be done if other treatments do not work. °HOME CARE INSTRUCTIONS °· Take medicines only as directed by your health care provider. °· Do not take over-the-counter medicines that lower your  platelet count, affect platelet function, or affect your blood's ability to clot. These include aspirin and ibuprofen. °· Do not participate in contact sports or other high-risk activities. Ask your health care provider which activities are safe for you. °· Keep all follow-up visits as directed by your health care provider. This is important. °SEEK MEDICAL CARE IF: °· You have new symptoms. °· Your symptoms get worse. °SEEK IMMEDIATE MEDICAL CARE IF: °· You have a sudden severe headache or confusion. °· You have significant bleeding. °· You have nausea and vomiting. °  °This information is not intended to replace advice given to you by your health care provider. Make sure you discuss any questions you have with your health care provider. °  °Document Released: 10/28/2013 Document Reviewed: 10/28/2013 °[employer]vier Interactive Patient Education ©2016 [employer] ° °

## 2016-01-16 NOTE — Discharge Summary (Signed)
Physician Discharge Summary  Edgar Mooney [id number] DOB: 17-Mar-1966 DOA: 01/14/2016   PCP: No primary care provider on file.  Admit date: 01/14/2016 Discharge date: 01/16/2016  Recommendations for Outpatient Follow-up:  1. Continue prednisone 60 mg daily per hematology recommendations. 2. Patient has appointment scheduled for 01/19/2016 for blood work in hematology office  Discharge Diagnoses:  Principal Problem:   Idiopathic thrombocytopenic purpura ([place]) Active Problems:   Hep C w/o coma, chronic (HCC)   Major depressive disorder, recurrent episode, severe, with psychosis ([place])   Polysubstance abuse   Depression    Discharge Condition: stable   Diet recommendation: as tolerated   History of present illness:  50 year old male with past medical history of chronic hepatitis C, never treated, polysubstance abuse, depression. Patient has been in detox facility for past week. He subsequently developed epistaxis and bleeding blisters in the mouth. He was transferred to [facility] ED for evaluation and was found to have platelet count of less than 5. Patient seen by hematology consultation who recommended IV steroids and IVIG.  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 Continue Wellbutrin   DVT prophylaxis: SCDs bilaterally Code Status: full code  Family Communication: No family at the bedside Disposition Plan: Home when platelet count at least 20 or above   Consultants:   Hematology, Dr. Benay Spice   Procedures:   None   Antimicrobials:   None     Signed:  Leisa Lenz, MD  [facility] 01/16/2016, 12:14 PM  Pager #: [phone]  Time spent in minutes: less than 30 minutes   Discharge Exam: Vitals:    01/16/16 0003 01/16/16 0439  BP: 120/74 122/76  Pulse: 68 65  Resp: 18 18  Temp: 97.7 F (36.5 C) 97.1 F (36.2 C)   Vitals:   01/15/16 2054 01/15/16 2110 01/16/16 0003 01/16/16 0439  BP: 116/69 123/62 120/74 122/76  Pulse: 69 69 68 65  Resp: 16 18 18 18   Temp: 97.7 F (36.5 C) 97.7 F (36.5 C) 97.7 F (36.5 C) 97.1 F (36.2 C)  TempSrc: Oral Oral Oral Oral  SpO2: 99% 98% 98% 98%  Weight:      Height:        General: Pt is alert, follows commands appropriately, not in acute distress Cardiovascular: Regular rate and rhythm, S1/S2 +, no murmurs Respiratory: Clear to auscultation bilaterally, no wheezing, no crackles, no rhonchi Abdominal: Soft, non tender, non distended, bowel sounds +, no guarding Extremities: no edema, no cyanosis, pulses palpable bilaterally DP and PT Neuro: Grossly nonfocal  Discharge Instructions  Discharge Instructions    Diet - low sodium heart healthy    Complete by:  As directed    Discharge instructions    Complete by:  As directed    Please continue prednisone as prescribed   Increase activity slowly    Complete by:  As directed        Medication List    STOP taking these medications   ibuprofen 600 MG tablet Commonly known as:  ADVIL,MOTRIN     TAKE these medications   acetaminophen 325 MG tablet Commonly known as:  TYLENOL Take 2 tablets (650 mg total) by mouth every 6 (six) hours as needed for mild pain (or Fever >/= 101).  buPROPion 150 MG 12 hr tablet Commonly known as:  WELLBUTRIN SR Take 2 tablets (300 mg total) by mouth daily. For depression   feeding supplement Liqd Take 1 Container by mouth 3 (three) times daily between meals.   nicotine 21 mg/24hr patch Commonly known as:  NICODERM CQ - dosed in mg/24 hours Place 1 patch (21 mg total) onto the skin daily. For smoking cessation   predniSONE 20 MG tablet Commonly known as:  DELTASONE Take 3 tablets (60 mg total) by mouth daily with breakfast. Start taking on:   01/17/2016   zolpidem 10 MG tablet Commonly known as:  AMBIEN Take 1 tablet (10 mg total) by mouth at bedtime as needed for sleep.      Follow-up Information    Betsy Coder, MD Follow up on 01/19/2016.   Specialty:  Oncology Why:  lab work   Contact information: [place] [address] [phone]            The results of significant diagnostics from this hospitalization (including imaging, microbiology, ancillary and laboratory) are listed below for reference.    Significant Diagnostic Studies: No results found.  Microbiology: No results found for this or any previous visit (from the past 240 hour(s)).   Labs: Basic Metabolic Panel:  Recent Labs Lab 01/14/16 1215  NA 136  K 4.2  CL 105  CO2 24  GLUCOSE 86  BUN 17  CREATININE 0.84  CALCIUM 9.0   Liver Function Tests:  Recent Labs Lab 01/14/16 1215  AST 29  ALT 32  ALKPHOS 78  BILITOT 1.1  PROT 7.3  ALBUMIN 3.7   No results for input(s): LIPASE, AMYLASE in the last 168 hours. No results for input(s): AMMONIA in the last 168 hours. CBC:  Recent Labs Lab 01/14/16 1215 01/15/16 0526 01/16/16 0437  WBC 6.4 9.9 10.2  NEUTROABS 3.9  --   --   HGB 14.6 13.7 13.2  HCT 42.5 38.8* 38.2*  MCV 88.0 86.6 88.6  PLT <5* 5* 40*   Cardiac Enzymes: No results for input(s): CKTOTAL, CKMB, CKMBINDEX, TROPONINI in the last 168 hours. BNP: BNP (last 3 results) No results for input(s): BNP in the last 8760 hours.  ProBNP (last 3 results) No results for input(s): PROBNP in the last 8760 hours.  CBG: No results for input(s): GLUCAP in the last 168 hours.

## 2016-01-16 NOTE — Progress Notes (Signed)
[facility] will be picking patient up for transportation at 5:30pm.   Kingsley Spittle, [place] Worker [phone]

## 2016-01-16 NOTE — Progress Notes (Signed)
Initial Nutrition Assessment  INTERVENTION:   -D/c Ensure -Provide Boost Breeze po TID, each supplement provides 250 kcal and 9 grams of protein -Encourage PO intake -RD to continue to monitor  NUTRITION DIAGNOSIS:   Increased nutrient needs related to other (see comment) (polysubstance abuse) as evidenced by estimated needs.  GOAL:   Patient will meet greater than or equal to 90% of their needs  MONITOR:   PO intake, Supplement acceptance, Labs, Weight trends, I & O's  REASON FOR ASSESSMENT:   Malnutrition Screening Tool    ASSESSMENT:   50 year old male with past medical history of chronic hepatitis C, never treated, polysubstance abuse, depression. Patient has been in detox facility for past week. He subsequently developed epistaxis and bleeding blisters in the mouth. He was transferred to [facility] ED for evaluation and was found to have platelet count of less than 5.  Patient in room sleeping. RD was able to wake patient. Pt reports good appetite with no recent changes. States he eats 3 meals a day normally. Pt does not use nutritional supplements at home. Reviewed protein foods with patient. Pt states he does not like the ensure supplements and is willing to try Boost Breeze supplements. Pt also reported possibly discharging today. Pt denies any swallowing or chewing issues despite mouth sores. PO intakes have been 100%.   Per weight history, pt has lost 44 lb since 3/22 (24% wt loss x 6.5 months, significant for time frame). Nutrition focused physical exam shows no sign of depletion of muscle mass or body fat.  Labs reviewed. Medications reviewed.  Diet Order:  Diet regular Room service appropriate? Yes; Fluid consistency: Thin  Skin:  Reviewed, no issues  Last BM:  PTA  Height:   Ht Readings from Last 1 Encounters:  01/14/16 5\' 2"  (1.575 m)    Weight:   Wt Readings from Last 1 Encounters:  01/14/16 141 lb 12.1 oz (64.3 kg)    Ideal Body Weight:  53.6  kg  BMI:  Body mass index is 25.93 kg/m.  Estimated Nutritional Needs:   Kcal:  1900-2100  Protein:  90-100g  Fluid:  2L/day  EDUCATION NEEDS:   Education needs addressed  Clayton Bibles, MS, RD, LDN Pager: [phone] After Hours Pager: [phone]

## 2016-01-16 NOTE — Progress Notes (Signed)
IP PROGRESS NOTE  Subjective:   He reports a mild headache during the IVIG infusion last night. No complaints morning. No bleeding.  Objective: Vital signs in last 24 hours: Blood pressure 122/76, pulse 65, temperature 97.1 F (36.2 C), temperature source Oral, resp. rate 18, height 5\' 2"  (1.575 m), weight 141 lb 12.1 oz (64.3 kg), SpO2 98 %.  Intake/Output from previous day: 10/01 0701 - 10/02 0700 In: 1080 [P.O.:1080] Out: -   Physical Exam:  HEENT: No active bleeding, resolving areas of ecchymosis at the buccal mucosa bilaterally. Mild whitecoat over the tongue. Skin: Improved Petechial rash over the legs  Lab Results:  Recent Labs  01/15/16 0526 01/16/16 0437  WBC 9.9 10.2  HGB 13.7 13.2  HCT 38.8* 38.2*  PLT 5* 40*    BMET  Recent Labs  01/14/16 1215  NA 136  K 4.2  CL 105  CO2 24  GLUCOSE 86  BUN 17  CREATININE 0.84  CALCIUM 9.0   LDH 149   Medications: I have reviewed the patient's current medications.  Assessment/Plan:  1. 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 Hepatitis C positive 3. Polysubstance abuse 4. Depression 5. History of gout  The platelet count has responded to IVIG and Solu-Medrol. I recommend changing to prednisone at a dose of 60 mg daily. He is stable for discharge from a hematology standpoint. We will arrange for a CBC later this week and an office visit during the week of 01/23/2016.  Recommendations: 1. Discontinue Solu-Medrol, start prednisone at a dose of 60 mg daily 2. Outpatient follow-up at the [facility] for a lab visit 01/19/2016 and an office visit during the week of 01/23/2016   LOS: 2 days   Betsy Coder, MD   01/16/2016, 10:49 AM

## 2016-01-16 NOTE — Progress Notes (Signed)
No formal consult for patient, however patient was admitted from [facility] 1 week ago for detox.  Call placed to [facility] in regards to patient returning. [facility] reports they want to review his DC summary and clinicals and then will follow up with plan for return.  If patient cannot return, LCSW will arrange for outpatient if patient willing.  Lane Hacker, MSW Clinical Social Work: [employer] Coverage for :  [phone]

## 2016-01-17 ENCOUNTER — Telehealth: Payer: Self-pay | Admitting: Oncology

## 2016-01-17 NOTE — Telephone Encounter (Signed)
Phone number in pt file is not in service. Letter mailed 10/3 for Oct appt

## 2016-01-19 ENCOUNTER — Other Ambulatory Visit: Payer: Self-pay

## 2016-01-24 ENCOUNTER — Inpatient Hospital Stay: Payer: Self-pay | Admitting: Oncology

## 2016-01-24 ENCOUNTER — Other Ambulatory Visit: Payer: Self-pay

## 2016-01-24 ENCOUNTER — Telehealth: Payer: Self-pay | Admitting: *Deleted

## 2016-01-24 NOTE — Telephone Encounter (Signed)
Pt failed to keep appt for lab on 10/5 and again for lab/ office today. Called [facility] to get in touch with pt. He has been discharged. They have no contact information for pt. Number listed as emergency contact is no longer in service.

## 2016-01-31 ENCOUNTER — Encounter (HOSPITAL_COMMUNITY): Payer: Self-pay | Admitting: Emergency Medicine

## 2016-01-31 ENCOUNTER — Emergency Department (HOSPITAL_COMMUNITY)
Admission: EM | Admit: 2016-01-31 | Discharge: 2016-01-31 | Disposition: A | Discharge status: Home / Self Care | Payer: PRIVATE HEALTH INSURANCE | Attending: Emergency Medicine | Admitting: Emergency Medicine

## 2016-01-31 DIAGNOSIS — M109 Gout, unspecified: Secondary | ICD-10-CM

## 2016-01-31 DIAGNOSIS — F17210 Nicotine dependence, cigarettes, uncomplicated: Secondary | ICD-10-CM | POA: Insufficient documentation

## 2016-01-31 DIAGNOSIS — M10062 Idiopathic gout, left knee: Secondary | ICD-10-CM | POA: Insufficient documentation

## 2016-01-31 DIAGNOSIS — I10 Essential (primary) hypertension: Secondary | ICD-10-CM | POA: Insufficient documentation

## 2016-01-31 MED ORDER — INDOMETHACIN 50 MG PO CAPS: 50.0000 mg | ORAL_CAPSULE | Freq: Two times a day (BID) | ORAL | 0 refills | Status: DC

## 2016-01-31 MED ORDER — INDOMETHACIN 25 MG PO CAPS
50.0000 mg | ORAL_CAPSULE | Freq: Once | ORAL | Status: AC
  Administered 2016-01-31: 50 mg via ORAL
  Filled 2016-01-31: qty 2

## 2016-01-31 MED FILL — INDOMETHACIN 50 MG CAPSULE: 50 | 15 days supply | Qty: 30 | Fill #0

## 2016-01-31 NOTE — ED Notes (Signed)
Case management in

## 2016-01-31 NOTE — ED Provider Notes (Signed)
[facility] DEPT Provider Note   CSN: [id number] Arrival date & time: 01/31/16  [id number]  By signing my name below, I, Sonum Patel, attest that this documentation has been prepared under the direction and in the presence of Shary Decamp, PA-C. Electronically Signed: Sonum Patel, Education administrator. 01/31/16. 9:50 AM.  History   Chief Complaint Chief Complaint  Patient presents with  . Gout    The history is provided by the patient. No language interpreter was used.     HPI Comments: Edgar Mooney is a 50 y.o. male with past medical history of gout who presents to the Emergency Department complaining of an episode of gradual onset, constant, gradually worsening pain and swelling to the left knee that began 2 days ago. Patient states the symptoms initially began in his left ankle but moved to his left knee. Patient reports similar symptoms that occur with prior gout episodes. He has not taken any medications for his current symptoms. He denies new numbness or tingling.   Past Medical History:  Diagnosis Date  . Alcohol abuse   . Cocaine abuse   . Depression   . Gout   . Hep C w/o coma, chronic [facility]) diagnosed May 2016    Patient Active Problem List   Diagnosis Date Noted  . Depression   . Idiopathic thrombocytopenic purpura ([place]) 01/14/2016  . Polysubstance abuse 01/14/2016  . Major depressive disorder, recurrent severe without psychotic features ([place]) 01/05/2016  . Cocaine abuse 01/05/2016  . Benzodiazepine abuse 01/05/2016  . Major depressive disorder, recurrent episode, severe, with psychosis ([place]) 01/05/2016  . Gout 07/06/2015  . Hep C w/o coma, chronic ([place]) 07/06/2015  . HTN (hypertension) 12/01/2014  . Elevated blood uric acid level 12/01/2014  . History of ETOH abuse 12/01/2014    History reviewed. No pertinent surgical history.   Home Medications    Prior to Admission medications   Medication Sig Start Date End Date Taking? Authorizing Provider  acetaminophen (TYLENOL)  325 MG tablet Take 2 tablets (650 mg total) by mouth every 6 (six) hours as needed for mild pain (or Fever >/= 101). 01/16/16   Robbie Lis, MD  buPROPion (WELLBUTRIN SR) 150 MG 12 hr tablet Take 2 tablets (300 mg total) by mouth daily. For depression 01/11/16   Encarnacion Slates, NP  feeding supplement (BOOST / RESOURCE BREEZE) LIQD Take 1 Container by mouth 3 (three) times daily between meals. 01/16/16   Robbie Lis, MD  nicotine (NICODERM CQ - DOSED IN MG/24 HOURS) 21 mg/24hr patch Place 1 patch (21 mg total) onto the skin daily. For smoking cessation 01/11/16   Encarnacion Slates, NP  predniSONE (DELTASONE) 20 MG tablet Take 3 tablets (60 mg total) by mouth daily with breakfast. 01/17/16   Robbie Lis, MD  zolpidem (AMBIEN) 10 MG tablet Take 1 tablet (10 mg total) by mouth at bedtime as needed for sleep. 01/10/16 02/09/16  Encarnacion Slates, NP    Family History Family History  Problem Relation Age of Onset  . Cancer Mother   . Cancer Father     Social History Social History  Substance Use Topics  . Smoking status: Current Every Day Smoker    Packs/day: 1.00    Years: 25.00    Types: Cigarettes  . Smokeless tobacco: Never Used  . Alcohol use 75.6 oz/week    126 Cans of beer per week     Comment: Every day     Allergies   Review of patient's allergies  indicates no known allergies.   Review of Systems Review of Systems  Musculoskeletal: Positive for arthralgias and joint swelling.  Neurological: Negative for weakness and numbness.   Physical Exam Updated Vital Signs BP 117/87 (BP Location: Right Arm)   Pulse 85   Temp 97.5 F (36.4 C) (Oral)   Resp 18   Ht 5\' 2"  (1.575 m)   Wt 140 lb (63.5 kg)   SpO2 100%   BMI 25.61 kg/m   Physical Exam  Constitutional: He is oriented to person, place, and time. Vital signs are normal. He appears well-developed and well-nourished.  HENT:  Head: Normocephalic and atraumatic.  Right Ear: Hearing normal.  Left Ear: Hearing normal.  Eyes:  Conjunctivae and EOM are normal. Pupils are equal, round, and reactive to light.  Neck: Normal range of motion. Neck supple.  Cardiovascular: Normal rate, regular rhythm and intact distal pulses.   Pulmonary/Chest: Effort normal.  Musculoskeletal: Normal range of motion. He exhibits edema and tenderness.  Soft tissue swelling noted around left knee. No erythema or signs of infection. Tender to light palpation. ROM intact.   Neurological: He is alert and oriented to person, place, and time. No sensory deficit.  Skin: Skin is warm and dry.  Psychiatric: He has a normal mood and affect. His speech is normal and behavior is normal. Thought content normal.  Nursing note and vitals reviewed.  ED Treatments / Results  DIAGNOSTIC STUDIES: Oxygen Saturation is 100% on RA, normal by my interpretation.    COORDINATION OF CARE: 9:45 AM Discussed treatment plan with pt at bedside and pt agreed to plan.   Labs (all labs ordered are listed, but only abnormal results are displayed) Labs Reviewed - No data to display  EKG  EKG Interpretation None      Radiology No results found.  Procedures Procedures (including critical care time)  Medications Ordered in ED Medications  indomethacin (INDOCIN) capsule 50 mg (not administered)   Initial Impression / Assessment and Plan / ED Course  I have reviewed the triage vital signs and the nursing notes.  Pertinent labs & imaging results that were available during my care of the patient were reviewed by me and considered in my medical decision making (see chart for details).  Clinical Course   Final Clinical Impressions(s) / ED Diagnoses  I have reviewed the relevant previous healthcare records.. I obtained HPI from historian.  ED Course:  Assessment: Pt presents with monoarticular pain, swelling. Hx same. Pt is afebrile and stable. Renal function good. Pt without known peptic ulcer disease and not receiving concurrent treatment on warfarin. Pt  dc with indomethacin (50 mg PO TID). Discussed that pt should respond to treatment with in 24 hour of begining treatment & likely resolve in 2-3 days.   Disposition/Plan:  DC Home Additional Verbal discharge instructions given and discussed with patient.  Pt Instructed to f/u with PCP in the next week for evaluation and treatment of symptoms. Return precautions given Pt acknowledges and agrees with plan  Supervising Physician Lajean Saver, MD   Final diagnoses:  Acute gout of left knee, unspecified cause    New Prescriptions New Prescriptions   No medications on file   I personally performed the services described in this documentation, which was scribed in my presence. The recorded information has been reviewed and is accurate.    Shary Decamp, PA-C 01/31/16 [place], MD 02/03/16 [phone]

## 2016-01-31 NOTE — Care Management Note (Signed)
Case Management Note  Patient Details  Name: Edgar Mooney MRN: [id number] Date of Birth: 1966-04-14  Subjective/Objective:                  50 y.o. male with past medical history of gout who presents to the Emergency Department complaining of an episode of gradual onset, constant, gradually worsening pain and swelling to the left knee that began 2 days ago. Pt is homeless.  Action/Plan: Follow for disposition needs. [facility]   Expected Discharge Date:  01/31/16                Expected Discharge Plan:  Homeless Shelter  In-House Referral:  NA  Discharge planning Services  CM Consult, [facility]  Post Acute Care Choice:  NA Choice offered to:  NA  DME Arranged:  N/A DME Agency:  NA  HH Arranged:  NA HH Agency:  NA  Status of Service:  Completed, signed off  If discussed at [place] of Stay Meetings, dates discussed:    Additional Comments: ED CM consulted by EDP [place] for medication assistance  NCM reviewed chart review information and spoke with the pt about [facility] MATCH program ($3 co pay for each Rx through [facility] program, does not include refills, 7 day expiration of [place] letter and choice of pharmacies). Pt is eligible for [facility] MATCH program (unable to find pt listed in PDMI per cardholder name inquiry) and has agreed to accept [place] under terms discussed. PDMI information entered. [place] letter completed and provided to pt.  NCM updated EDP and ED RN.   NCM also confirmed that pt does not have PCP. NCM discussed and provided written information for uninsured PCP and the importance of PCP for f/u care.  Fuller Mandril, RN 01/31/2016, 11:32 AM

## 2016-01-31 NOTE — Discharge Instructions (Addendum)
Please read and follow all provided instructions.  Your diagnoses today include:  1. Acute gout of left knee, unspecified cause    Tests performed today include: Vital signs. See below for your results today.   Medications prescribed:  Take as prescribed   Home care instructions:  Follow any educational materials contained in this packet.  Follow-up instructions: Please follow-up with your primary care provider for further evaluation of symptoms and treatment   Return instructions:  Please return to the Emergency Department if you do not get better, if you get worse, or new symptoms OR  - Fever (temperature greater than 101.28F)  - Bleeding that does not stop with holding pressure to the area    -Severe pain (please note that you may be more sore the day after your accident)  - Chest Pain  - Difficulty breathing  - Severe nausea or vomiting  - Inability to tolerate food and liquids  - Passing out  - Skin becoming red around your wounds  - Change in mental status (confusion or lethargy)  - New numbness or weakness    Please return if you have any other emergent concerns.  Additional Information:  Your vital signs today were: BP 117/87 (BP Location: Right Arm)    Pulse 85    Temp 97.5 F (36.4 C) (Oral)    Resp 18    Ht 5\' 2"  (1.575 m)    Wt 63.5 kg    SpO2 100%    BMI 25.61 kg/m  If your blood pressure (BP) was elevated above 135/85 this visit, please have this repeated by your doctor within one month. ---------------

## 2016-01-31 NOTE — ED Notes (Signed)
Pt given sandwich and Sprite

## 2016-01-31 NOTE — ED Triage Notes (Signed)
Patient states he started having a gout flare up in his L knee on Sunday.  "I have had gout before".  Patient denies other symptoms.

## 2016-04-03 ENCOUNTER — Encounter (HOSPITAL_COMMUNITY): Payer: Self-pay | Admitting: Emergency Medicine

## 2016-04-03 ENCOUNTER — Emergency Department (HOSPITAL_COMMUNITY)
Admission: EM | Admit: 2016-04-03 | Discharge: 2016-04-03 | Disposition: A | Discharge status: Home / Self Care | Payer: Self-pay | Attending: Emergency Medicine | Admitting: Emergency Medicine

## 2016-04-03 DIAGNOSIS — R45851 Suicidal ideations: Secondary | ICD-10-CM

## 2016-04-03 DIAGNOSIS — F1910 Other psychoactive substance abuse, uncomplicated: Secondary | ICD-10-CM | POA: Insufficient documentation

## 2016-04-03 DIAGNOSIS — F17210 Nicotine dependence, cigarettes, uncomplicated: Secondary | ICD-10-CM | POA: Insufficient documentation

## 2016-04-03 DIAGNOSIS — F1414 Cocaine abuse with cocaine-induced mood disorder: Secondary | ICD-10-CM | POA: Diagnosis present

## 2016-04-03 DIAGNOSIS — Z79899 Other long term (current) drug therapy: Secondary | ICD-10-CM | POA: Insufficient documentation

## 2016-04-03 DIAGNOSIS — I10 Essential (primary) hypertension: Secondary | ICD-10-CM | POA: Insufficient documentation

## 2016-04-03 DIAGNOSIS — F332 Major depressive disorder, recurrent severe without psychotic features: Secondary | ICD-10-CM | POA: Insufficient documentation

## 2016-04-03 LAB — ETHANOL: Alcohol, Ethyl (B): <5 mg/dL (ref <5)

## 2016-04-03 LAB — COMPREHENSIVE METABOLIC PANEL
ALT: 57 U/L (ref 17–63)
AST: 52 U/L — ABNORMAL HIGH (ref 15–41)
Albumin: 3.3 g/dL — ABNORMAL LOW (ref 3.5–5.0)
Alkaline Phosphatase: 58 U/L (ref 38–126)
Anion gap: 7 (ref 5–15)
BUN: 25 mg/dL — ABNORMAL HIGH (ref 6–20)
CO2: 25 mmol/L (ref 22–32)
Calcium: 8.7 mg/dL — ABNORMAL LOW (ref 8.9–10.3)
Chloride: 109 mmol/L (ref 101–111)
Creatinine, Ser: 0.79 mg/dL (ref 0.61–1.24)
GFR calc Af Amer: >60 mL/min (ref >60)
GFR calc non Af Amer: >60 mL/min (ref >60)
Glucose, Bld: 140 mg/dL — ABNORMAL HIGH (ref 65–99)
Potassium: 3.6 mmol/L (ref 3.5–5.1)
Sodium: 141 mmol/L (ref 135–145)
Total Bilirubin: 0.6 mg/dL (ref 0.3–1.2)
Total Protein: 6.2 g/dL — ABNORMAL LOW (ref 6.5–8.1)

## 2016-04-03 LAB — CBC
HCT: 38.5 % — ABNORMAL LOW (ref 39.0–52.0)
Hemoglobin: 13.3 g/dL (ref 13.0–17.0)
MCH: 31.6 pg (ref 26.0–34.0)
MCHC: 34.5 g/dL (ref 30.0–36.0)
MCV: 91.4 fL (ref 78.0–100.0)
Platelets: 124 10*3/uL — ABNORMAL LOW (ref 150–400)
RBC: 4.21 MIL/uL — ABNORMAL LOW (ref 4.22–5.81)
RDW: 13.3 % (ref 11.5–15.5)
WBC: 4.4 10*3/uL (ref 4.0–10.5)

## 2016-04-03 LAB — RAPID URINE DRUG SCREEN, HOSP PERFORMED
Amphetamines: NOT DETECTED
Barbiturates: NOT DETECTED
Benzodiazepines: POSITIVE — AB
Cocaine: POSITIVE — AB
Opiates: NOT DETECTED
Tetrahydrocannabinol: POSITIVE — AB

## 2016-04-03 LAB — SALICYLATE LEVEL: Salicylate Lvl: <7 mg/dL (ref 2.8–30.0)

## 2016-04-03 LAB — ACETAMINOPHEN LEVEL: Acetaminophen (Tylenol), Serum: <10 ug/mL — ABNORMAL LOW (ref 10–30)

## 2016-04-03 MED ORDER — ZOLPIDEM TARTRATE 5 MG PO TABS: 5.0000 mg | ORAL_TABLET | Freq: Every evening | ORAL | Status: DC | PRN

## 2016-04-03 MED ORDER — ALUM & MAG HYDROXIDE-SIMETH 200-200-20 MG/5ML PO SUSP: 30.0000 mL | ORAL | Status: DC | PRN

## 2016-04-03 MED ORDER — ACETAMINOPHEN 325 MG PO TABS: 650.0000 mg | ORAL_TABLET | ORAL | Status: DC | PRN

## 2016-04-03 MED ORDER — LORAZEPAM 1 MG PO TABS: 1.0000 mg | ORAL_TABLET | Freq: Three times a day (TID) | ORAL | Status: DC | PRN

## 2016-04-03 MED ORDER — IBUPROFEN 200 MG PO TABS: 600.0000 mg | ORAL_TABLET | Freq: Three times a day (TID) | ORAL | Status: DC | PRN

## 2016-04-03 MED ORDER — NICOTINE 21 MG/24HR TD PT24: 21.0000 mg | MEDICATED_PATCH | Freq: Every day | TRANSDERMAL | Status: DC

## 2016-04-03 MED ORDER — ONDANSETRON HCL 4 MG PO TABS: 4.0000 mg | ORAL_TABLET | Freq: Three times a day (TID) | ORAL | Status: DC | PRN

## 2016-04-03 NOTE — BH Assessment (Addendum)
Assessment Note  Edgar Mooney is an 50 y.o. male. He presents to [facility] requesting detox (crack, cocaine, and alcohol). Patient is voluntary and self referred. He reports crack use starting at the age of 60. He uses $80 dollars worth of crack per day. His use is daily since the age of 16. Patient also reports use of cocaine. His started using cocaine at the age of 74. Average amount of use is $80 per day. He has used daily for the past 7-8 months. Last, patient drinks alcohol. He started drinking at the age of 2. He drinks a 12 pack of beer per day. Last use of all substances was yesterday. No history of seizures or blackouts. He does report tremors. He Has a history of treatment at [facility] in [facility], Oct 2017.   Patient also suicidal with a plan to overdose. Sts he has felt suicidal for the past 2 days. He is unable to contract for safety. He does not identify any stressors. No previous suicide attempts or gestures. No self mutilating behaviors. He reports ongoing depressive symptoms including loss of interest in usual pleasures. No family history of mental health illness. Patient hospitalized at [facility] September 2017 for suicidal ideations and depression.   Patient denies HI. He is calm and cooperative. He denies legal issues. No AVH's. Patient does not appear to be responding to internal stimuli. No history of sexual, physical, and/or emotional abuse.   Diagnosis: Major Depressive Disorder, Recurrent, Severe, without psychotic features and Polysubstance Abuse  Past Medical History:  Past Medical History:  Diagnosis Date  . Alcohol abuse   . Cocaine abuse   . Depression   . Gout   . Hep C w/o coma, chronic [facility]) diagnosed May 2016    History reviewed. No pertinent surgical history.  Family History:  Family History  Problem Relation Age of Onset  . Cancer Mother   . Cancer Father     Social History:  reports that he has been smoking Cigarettes.  He has a 25.00 pack-year smoking  history. He has never used smokeless tobacco. He reports that he drinks about 75.6 oz of alcohol per week . He reports that he uses drugs, including Cocaine, IV, Heroin, and Marijuana.  Additional Social History:  Alcohol / Drug Use Pain Medications: Pt denies.  Prescriptions: Pt denies.  Over the Counter: Pt denies.  History of alcohol / drug use?: Yes Withdrawal Symptoms: Sweats, Tremors Substance #1 Name of Substance 1: Crack  1 - Age of First Use: 24 1 - Amount (size/oz): $80 per day  1 - Frequency: daily  1 - Duration: daily since age of 37 1 - Last Use / Amount: "last night"; 04/03/2016; $50 worth  Substance #2 Name of Substance 2: Cocaine  2 - Age of First Use: 50 yrs old  2 - Amount (size/oz): $80 per day  2 - Frequency: daily  2 - Duration: daily for the past 7-8 months  2 - Last Use / Amount: "last night"; 04/03/2016; $80 worth  Substance #3 Name of Substance 3: Alcohol  3 - Age of First Use: 50 yrs old  3 - Amount (size/oz): 18 pack of beer  3 - Frequency: daily  3 - Duration: daily since the age of 50 yrs old 3 - Last Use / Amount: 04/02/2016; 12pack of beer  CIWA: CIWA-Ar BP: 143/87 Pulse Rate: 66 COWS:    Allergies: No Known Allergies  Home Medications:  (Not in a hospital admission)  OB/GYN Status:  No LMP for male patient.  General Assessment Data [facility] of [facility]: [facility] TTS Assessment: In system Is this a Tele or Face-to-Face Assessment?: Face-to-Face Is this an Initial Assessment or a Re-assessment for this encounter?: Re-Assessment Marital status: Single Maiden name:  (n/a) Is patient pregnant?: No Pregnancy Status: No Living Arrangements: Other (Comment) (homeless ) Can pt return to current living arrangement?: Yes Admission Status: Voluntary Is patient capable of signing voluntary admission?: Yes Referral Source: Self/Family/Friend Insurance type:  (Self Pay )     Crisis Care Plan Living Arrangements: Other (Comment) (homeless  ) Legal Guardian: Other: (no legal guardian ) Name of Psychiatrist:  (no psychiatrist ) Name of Therapist:  (no therapist )  Education Status Is patient currently in school?: No Current Grade:  (n/a) Highest grade of school patient has completed:  (10th grade ) Name of school:  (n/a) Contact person:  (n/a)  Risk to self with the past 6 months Suicidal Ideation: Yes-Currently Present Has patient been a risk to self within the past 6 months prior to admission? : Yes Suicidal Intent: Yes-Currently Present Has patient had any suicidal intent within the past 6 months prior to admission? : Yes Is patient at risk for suicide?: Yes Suicidal Plan?: Yes-Currently Present Has patient had any suicidal plan within the past 6 months prior to admission? : No Specify Current Suicidal Plan:  (overdose ) Access to Means: Yes Specify Access to Suicidal Means:  (drugs ) What has been your use of drugs/alcohol within the last 12 months?:  (crack, cocaine, and alcohol ) Previous Attempts/Gestures: No How many times?:  (n/a) Other Self Harm Risks:  (denies ) Triggers for Past Attempts: Other (Comment) (no past attempts or gestures ) Intentional Self Injurious Behavior: None Family Suicide History: No Recent stressful life event(s): Other (Comment) (denies) Persecutory voices/beliefs?: No Depression: Yes Depression Symptoms: Feeling angry/irritable, Loss of interest in usual pleasures, Guilt, Isolating Substance abuse history and/or treatment for substance abuse?: No Suicide prevention information given to non-admitted patients: Not applicable  Risk to Others within the past 6 months Homicidal Ideation: No Does patient have any lifetime risk of violence toward others beyond the six months prior to admission? : No Thoughts of Harm to Others: No Current Homicidal Intent: No Current Homicidal Plan: No Access to Homicidal Means: No Identified Victim:  (n/a) History of harm to others?: No Assessment  of Violence: None Noted Violent Behavior Description:  (patient calm and cooperative ) Does patient have access to weapons?: No Criminal Charges Pending?: No Does patient have a court date: No Is patient on probation?: No  Psychosis Hallucinations: None noted Delusions: None noted  Mental Status Report Appearance/Hygiene: Disheveled Eye Contact: Good Motor Activity: Freedom of movement Speech: Logical/coherent Level of Consciousness: Alert Mood: Depressed Affect: Appropriate to circumstance Anxiety Level: None Thought Processes: Relevant, Coherent Judgement: Impaired Orientation: Person, Place, Time, Situation Obsessive Compulsive Thoughts/Behaviors: None  Cognitive Functioning Concentration: Decreased Memory: Recent Intact, Remote Intact IQ: Average Insight: Poor Impulse Control: Fair Appetite: Poor Weight Loss:  (10-15 pounds in 2 weeks ) Weight Gain:  (n/a) Sleep: Decreased Total Hours of Sleep:  (4 hrs per night due to racing thoughts) Vegetative Symptoms: None  ADLScreening [facility] Assessment Services) Patient's cognitive ability adequate to safely complete daily activities?: Yes Patient able to express need for assistance with ADLs?: Yes Independently performs ADLs?: Yes (appropriate for developmental age)  Prior Inpatient Therapy Prior Inpatient Therapy: Yes Prior Therapy Facilty/Provider(s):  ([facility]-12/2015 and [facility]) Reason for Treatment:  (substance abuse,  depression, suicidal ideations )  Prior Outpatient Therapy Prior Outpatient Therapy: No Prior Therapy Dates:  (n/a) Prior Therapy Facilty/Provider(s):  (n/a) Reason for Treatment:  (n/a) Does patient have an ACCT team?: No Does patient have Intensive In-House Services?  : No Does patient have [facility] services? : No Does patient have P4CC services?: No  ADL Screening (condition at time of admission) Patient's cognitive ability adequate to safely complete daily activities?: Yes Is the patient  deaf or have difficulty hearing?: No Does the patient have difficulty seeing, even when wearing glasses/contacts?: No Does the patient have difficulty concentrating, remembering, or making decisions?: No Patient able to express need for assistance with ADLs?: Yes Does the patient have difficulty dressing or bathing?: No Independently performs ADLs?: Yes (appropriate for developmental age) Does the patient have difficulty walking or climbing stairs?: No Weakness of Legs: None Weakness of Arms/Hands: None  Home Assistive Devices/Equipment Home Assistive Devices/Equipment: None    Abuse/Neglect Assessment (Assessment to be complete while patient is alone) Physical Abuse: Denies Verbal Abuse: Denies Sexual Abuse: Denies Exploitation of patient/patient's resources: Denies Self-Neglect: Denies Values / Beliefs Cultural Requests During Hospitalization: None Spiritual Requests During Hospitalization: None   Advance Directives (For Healthcare) Does Patient Have a Medical Advance Directive?: No Would patient like information on creating a medical advance directive?: No - Patient declined    Additional Information 1:1 In Past 12 Months?: No CIRT Risk: No Elopement Risk: No Does patient have medical clearance?: Yes     Disposition: Patient meets criteria for INPT treatment, per Dr. Darleene Cleaver and Waylan Boga, [place]. (300 hall bed). TTS to seek placement.   Disposition Initial Assessment Completed for this Encounter: Yes  On Site Evaluation by:   Reviewed with Physician:    Waldon Merl 04/03/2016 8:27 AM

## 2016-04-03 NOTE — ED Provider Notes (Signed)
[place] DEPT Provider Note   CSN: [id number] Arrival date & time: 04/03/16  0228     History   Chief Complaint Chief Complaint  Patient presents with  . Drug Problem  . Suicidal    HPI Edgar Mooney is a 50 y.o. male.  He comes to the ED requesting help with detox from cocaine and alcohol. He states he drinks 18 beers a day with last drink being about 10:30 PM last night. He admits to using about $80 worth of cocaine yesterday. He denies other drug use. He does endorse depression with suicidal thoughts. He states that he has a plan to overdose he denies hallucinations.. He does have constitutional symptoms of depression of crying spells, early morning wakening, and anhedonia. He is requesting assistance to get into a residential detox program.   The history is provided by the patient.  Drug Problem     Past Medical History:  Diagnosis Date  . Alcohol abuse   . Cocaine abuse   . Depression   . Gout   . Hep C w/o coma, chronic [facility]) diagnosed May 2016    Patient Active Problem List   Diagnosis Date Noted  . Depression   . Idiopathic thrombocytopenic purpura ([place]) 01/14/2016  . Polysubstance abuse 01/14/2016  . Major depressive disorder, recurrent severe without psychotic features ([place]) 01/05/2016  . Cocaine abuse 01/05/2016  . Benzodiazepine abuse 01/05/2016  . Major depressive disorder, recurrent episode, severe, with psychosis ([place]) 01/05/2016  . Gout 07/06/2015  . Hep C w/o coma, chronic ([place]) 07/06/2015  . HTN (hypertension) 12/01/2014  . Elevated blood uric acid level 12/01/2014  . History of ETOH abuse 12/01/2014    History reviewed. No pertinent surgical history.     Home Medications    Prior to Admission medications   Medication Sig Start Date End Date Taking? Authorizing Provider  acetaminophen (TYLENOL) 325 MG tablet Take 2 tablets (650 mg total) by mouth every 6 (six) hours as needed for mild pain (or Fever >/= 101). Patient not taking:  Reported on 04/03/2016 01/16/16   Robbie Lis, MD  buPROPion [facility] SR) 150 MG 12 hr tablet Take 2 tablets (300 mg total) by mouth daily. For depression Patient not taking: Reported on 04/03/2016 01/11/16   Encarnacion Slates, NP  feeding supplement (BOOST / RESOURCE BREEZE) LIQD Take 1 Container by mouth 3 (three) times daily between meals. Patient not taking: Reported on 04/03/2016 01/16/16   Robbie Lis, MD  indomethacin (INDOCIN) 50 MG capsule Take 1 capsule (50 mg total) by mouth 2 (two) times daily with a meal. Patient not taking: Reported on 04/03/2016 01/31/16   Shary Decamp, PA-C  nicotine (NICODERM CQ - DOSED IN MG/24 HOURS) 21 mg/24hr patch Place 1 patch (21 mg total) onto the skin daily. For smoking cessation Patient not taking: Reported on 04/03/2016 01/11/16   Encarnacion Slates, NP  predniSONE (DELTASONE) 20 MG tablet Take 3 tablets (60 mg total) by mouth daily with breakfast. Patient not taking: Reported on 04/03/2016 01/17/16   Robbie Lis, MD    Family History Family History  Problem Relation Age of Onset  . Cancer Mother   . Cancer Father     Social History Social History  Substance Use Topics  . Smoking status: Current Every Day Smoker    Packs/day: 1.00    Years: 25.00    Types: Cigarettes  . Smokeless tobacco: Never Used  . Alcohol use 75.6 oz/week    126 Cans  of beer per week     Comment: 18 pack per day     Allergies   Patient has no known allergies.   Review of Systems Review of Systems  All other systems reviewed and are negative.    Physical Exam Updated Vital Signs BP 143/87 (BP Location: Left Arm)   Pulse 66   Temp 97.7 F (36.5 C) (Oral)   Resp 22   SpO2 98%   Physical Exam  Nursing note and vitals reviewed.  50 year old male, resting comfortably and in no acute distress. Vital signs are Significant for borderline hypertension and borderline tachypnea. Oxygen saturation is 98%, which is normal. Head is normocephalic and atraumatic.  PERRLA, EOMI. Oropharynx is clear. Neck is nontender and supple without adenopathy or JVD. Back is nontender and there is no CVA tenderness. Lungs are clear without rales, wheezes, or rhonchi. Chest is nontender. Heart has regular rate and rhythm without murmur. Abdomen is soft, flat, nontender without masses or hepatosplenomegaly and peristalsis is normoactive. Extremities have no cyanosis or edema, full range of motion is present. Skin is warm and dry without rash. Neurologic: Mental status is normal, cranial nerves are intact, there are no motor or sensory deficits.  ED Treatments / Results  Labs (all labs ordered are listed, but only abnormal results are displayed) Labs Reviewed  COMPREHENSIVE METABOLIC PANEL - Abnormal; Notable for the following:       Result Value   Glucose, Bld 140 (*)    BUN 25 (*)    Calcium 8.7 (*)    Total Protein 6.2 (*)    Albumin 3.3 (*)    AST 52 (*)    All other components within normal limits  ACETAMINOPHEN LEVEL - Abnormal; Notable for the following:    Acetaminophen (Tylenol), Serum <10 (*)    All other components within normal limits  CBC - Abnormal; Notable for the following:    RBC 4.21 (*)    HCT 38.5 (*)    Platelets 124 (*)    All other components within normal limits  ETHANOL  SALICYLATE LEVEL  RAPID URINE DRUG SCREEN, HOSP PERFORMED    Procedures Procedures (including critical care time)  Medications Ordered in ED Medications  alum & mag hydroxide-simeth (MAALOX/MYLANTA) 200-200-20 MG/5ML suspension 30 mL (not administered)  ondansetron (ZOFRAN) tablet 4 mg (not administered)  nicotine (NICODERM CQ - dosed in mg/24 hours) patch 21 mg (not administered)  zolpidem (AMBIEN) tablet 5 mg (not administered)  ibuprofen (ADVIL,MOTRIN) tablet 600 mg (not administered)  acetaminophen (TYLENOL) tablet 650 mg (not administered)  LORazepam (ATIVAN) tablet 1 mg (not administered)     Initial Impression / Assessment and Plan / ED Course   I have reviewed the triage vital signs and the nursing notes.  Pertinent lab results that were available during my care of the patient were reviewed by me and considered in my medical decision making (see chart for details).  Clinical Course    Polysubstance abuse with depression with suicidal ideation. Old records reviewed, and he does have a hospitalization at [facility] behavioral [facility] in September of this year. At this point, he is felt to be medically cleared for psychiatric evaluation and treatment.  Final Clinical Impressions(s) / ED Diagnoses   Final diagnoses:  Polysubstance abuse  Severe episode of recurrent major depressive disorder, without psychotic features ([place])  Suicidal ideation    New Prescriptions New Prescriptions   No medications on file     [name], MD  04/03/16 0538  

## 2016-04-03 NOTE — ED Notes (Addendum)
Patient became very rude and upset stating we did not treat him properly.  He was requesting to speak with MD.  Dr. Laverta Baltimore spoke with patient and stated he is up for discharge.

## 2016-04-03 NOTE — BH Assessment (Signed)
[place] Assessment Progress Note  Per Corena Pilgrim, MD, this pt does not require psychiatric hospitalization at this time.  Pt is to be discharged from [facility] with referral information for area substance abuse treatment programs.  Discharge instructions include referral information for [facility], [facility], [facility], and [facility].  Pt's nurse, Magda Paganini, has been notified.  Jalene Mullet, [place] Triage Specialist [phone]

## 2016-04-03 NOTE — ED Notes (Signed)
Patient is being given community resources for follow up.

## 2016-04-03 NOTE — Progress Notes (Signed)
Pt noted without insurance nor pcp for follow up care Noted in EPIC demographics pt homeless Cm noted pt assessment stating pt hearing voices CM went to see pt but noted he was asleep Pt not awakened  CM left in pt TCU locker #28 written information to assist pt with determining choice for uninsured accepting pcps, www.needymeds.org, www.goodrx.com, discounted pharmacies and other [employer] such as [employer], affordable care act, financial assistance, uninsured dental services, [place] med assist, DSS and  health department  Provided resources for [employer] uninsured accepting pcps like [facility], family medicine at [employer], [facility] [place], [facility], local urgent care centers, [facility], [facility] family practice, general medical clinics, homeless shelter, Control and instrumentation engineer, Restaurant manager, fast food, family services of the [place], [facility] urgent care plus others, medication resources, [facility] out patient pharmacies and housing Provided [employer]

## 2016-04-03 NOTE — ED Notes (Signed)
Patient is A & O x4.  He understood discharge instructions 

## 2016-04-03 NOTE — ED Triage Notes (Signed)
Pt requesting detox from alcohol and crack cocaine; last drink was at 10pm; pt states he drinks an 18 pack each day; pt endorses suicidal ideation from time to time; plan would be to OD; denies hallucinations

## 2016-04-03 NOTE — Discharge Instructions (Signed)
To help you maintain a sober lifestyle, a substance abuse treatment program may be beneficial to you.  Contact one of the following facilities at your earliest opportunity to ask about enrolling: ° °RESIDENTIAL PROGRAMS: ° °     [facility] °     [address] °     [place], [place] [address] °     [phone] ° °     [facility] °     [address] °     [place], [place] [address] °     [phone] ° °     [facility] °     [address] °     [place], [place] [address] °     [phone] ° °OUTPATIENT PROGRAMS: ° °     [facility] °     [address] °     [place], [place] [address] °     [phone] °     New patients are seen at the walk-in clinic every Tuesday from 9:00 am - 12:00 pm. °

## 2016-04-03 NOTE — Progress Notes (Signed)
Entered in d/c instructions Please use resources left for you by ED case manager to assist with follow up uninsured medical care,housing,homeless resources,etc

## 2016-04-03 NOTE — [facility] Suicide Risk Assessment (Signed)
Suicide Risk Assessment  Discharge Assessment   [facility] Discharge Suicide Risk Assessment   Principal Problem: Cocaine abuse with cocaine-induced mood disorder [facility]) Discharge Diagnoses:  Patient Active Problem List   Diagnosis Date Noted  . Cocaine abuse with cocaine-induced mood disorder [facility]) [F14.14] 04/03/2016    Priority: High  . Benzodiazepine abuse [F13.10] 01/05/2016    Priority: High  . Depression [F32.9]   . Idiopathic thrombocytopenic purpura ([place]) [D69.3] 01/14/2016  . Polysubstance abuse [F19.10] 01/14/2016  . Major depressive disorder, recurrent episode, severe, with psychosis ([place]) [F33.3] 01/05/2016  . Gout [M10.9] 07/06/2015  . Hep C w/o coma, chronic ([place]) [B18.2] 07/06/2015  . HTN (hypertension) [I10] 12/01/2014  . Elevated blood uric acid level [E79.0] 12/01/2014  . History of ETOH abuse [Z87.898] 12/01/2014    Total Time spent with patient: 45 minutes  Musculoskeletal: Strength & Muscle Tone: within normal limits Gait & Station: normal Patient leans: N/A  Psychiatric Specialty Exam:   Blood pressure 137/79, pulse 69, temperature 98.3 F (36.8 C), temperature source Oral, resp. rate 19, SpO2 99 %.There is no height or weight on file to calculate BMI.  General Appearance: Disheveled  Eye Contact::  Good  Speech:  Normal Rate409  Volume:  Normal  Mood:  Depressed, mild  Affect:  Congruent  Thought Process:  Coherent and Descriptions of Associations: Intact  Orientation:  Full (Time, Place, and Person)  Thought Content:  WDL  Suicidal Thoughts:  No  Homicidal Thoughts:  No  Memory:  Immediate;   Good Recent;   Good Remote;   Good  Judgement:  Fair  Insight:  Fair  Psychomotor Activity:  Normal  Concentration:  Good  Recall:  Good  Fund of Knowledge:Fair  Language: Good  Akathisia:  No  Handed:  Right  AIMS (if indicated):     Assets:  Leisure Time Physical Health Resilience  Sleep:     Cognition: WNL  ADL's:  Intact   Mental Status Per  Nursing Assessment::   On Admission:   cocaine abuse with suicidal ideations  Demographic Factors:  Male and Caucasian  Loss Factors: NA  Historical Factors: NA  Risk Reduction Factors:   Sense of responsibility to family  Continued Clinical Symptoms:  Depression, mild  Cognitive Features That Contribute To Risk:  None    Suicide Risk:  Minimal: No identifiable suicidal ideation.  Patients presenting with no risk factors but with morbid ruminations; may be classified as minimal risk based on the severity of the depressive symptoms    Plan Of Care/Follow-up recommendations:  Activity:  as tolerated Diet:  heart healthy diet  Charlies Rayburn, NP 04/03/2016, 10:42 AM

## 2016-04-03 NOTE — ED Notes (Addendum)
PT MOVED TO [facility] PER TCU RN

## 2017-10-25 ENCOUNTER — Encounter (HOSPITAL_COMMUNITY): Payer: Self-pay | Admitting: *Deleted

## 2017-10-25 ENCOUNTER — Other Ambulatory Visit: Payer: Self-pay

## 2017-10-25 ENCOUNTER — Emergency Department (HOSPITAL_COMMUNITY)
Admission: EM | Admit: 2017-10-25 | Discharge: 2017-10-26 | Disposition: A | Discharge status: Home / Self Care | Payer: Self-pay | Attending: Emergency Medicine | Admitting: Emergency Medicine

## 2017-10-25 DIAGNOSIS — F17210 Nicotine dependence, cigarettes, uncomplicated: Secondary | ICD-10-CM | POA: Insufficient documentation

## 2017-10-25 DIAGNOSIS — R45851 Suicidal ideations: Secondary | ICD-10-CM | POA: Insufficient documentation

## 2017-10-25 DIAGNOSIS — S90812A Abrasion, left foot, initial encounter: Secondary | ICD-10-CM | POA: Insufficient documentation

## 2017-10-25 DIAGNOSIS — X838XXA Intentional self-harm by other specified means, initial encounter: Secondary | ICD-10-CM | POA: Insufficient documentation

## 2017-10-25 DIAGNOSIS — S80812A Abrasion, left lower leg, initial encounter: Secondary | ICD-10-CM | POA: Insufficient documentation

## 2017-10-25 DIAGNOSIS — T148XXA Other injury of unspecified body region, initial encounter: Secondary | ICD-10-CM

## 2017-10-25 DIAGNOSIS — Y929 Unspecified place or not applicable: Secondary | ICD-10-CM | POA: Insufficient documentation

## 2017-10-25 DIAGNOSIS — S90811A Abrasion, right foot, initial encounter: Secondary | ICD-10-CM | POA: Insufficient documentation

## 2017-10-25 DIAGNOSIS — F323 Major depressive disorder, single episode, severe with psychotic features: Secondary | ICD-10-CM | POA: Insufficient documentation

## 2017-10-25 DIAGNOSIS — S80811A Abrasion, right lower leg, initial encounter: Secondary | ICD-10-CM | POA: Insufficient documentation

## 2017-10-25 DIAGNOSIS — Y998 Other external cause status: Secondary | ICD-10-CM | POA: Insufficient documentation

## 2017-10-25 DIAGNOSIS — F1910 Other psychoactive substance abuse, uncomplicated: Secondary | ICD-10-CM | POA: Insufficient documentation

## 2017-10-25 DIAGNOSIS — F10920 Alcohol use, unspecified with intoxication, uncomplicated: Secondary | ICD-10-CM

## 2017-10-25 DIAGNOSIS — F1410 Cocaine abuse, uncomplicated: Secondary | ICD-10-CM | POA: Insufficient documentation

## 2017-10-25 DIAGNOSIS — Y9389 Activity, other specified: Secondary | ICD-10-CM | POA: Insufficient documentation

## 2017-10-25 DIAGNOSIS — F10120 Alcohol abuse with intoxication, uncomplicated: Secondary | ICD-10-CM | POA: Insufficient documentation

## 2017-10-25 LAB — RAPID URINE DRUG SCREEN, HOSP PERFORMED
Amphetamines: NOT DETECTED
Benzodiazepines: NOT DETECTED
Cocaine: POSITIVE — AB
Opiates: NOT DETECTED
Tetrahydrocannabinol: NOT DETECTED

## 2017-10-25 LAB — CBC
HCT: 43.8 % (ref 39.0–52.0)
Hemoglobin: 16 g/dL (ref 13.0–17.0)
MCH: 34.1 pg — ABNORMAL HIGH (ref 26.0–34.0)
MCHC: 36.5 g/dL — ABNORMAL HIGH (ref 30.0–36.0)
MCV: 93.4 fL (ref 78.0–100.0)
Platelets: 151 10*3/uL (ref 150–400)
RBC: 4.69 MIL/uL (ref 4.22–5.81)
RDW: 12.3 % (ref 11.5–15.5)
WBC: 11.4 10*3/uL — ABNORMAL HIGH (ref 4.0–10.5)

## 2017-10-25 LAB — ETHANOL: Alcohol, Ethyl (B): 215 mg/dL — ABNORMAL HIGH (ref <10)

## 2017-10-25 LAB — SALICYLATE LEVEL: Salicylate Lvl: <7 mg/dL (ref 2.8–30.0)

## 2017-10-25 LAB — COMPREHENSIVE METABOLIC PANEL
ALT: 59 U/L — ABNORMAL HIGH (ref 0–44)
AST: 55 U/L — ABNORMAL HIGH (ref 15–41)
Albumin: 3.9 g/dL (ref 3.5–5.0)
Alkaline Phosphatase: 65 U/L (ref 38–126)
Anion gap: 12 (ref 5–15)
BUN: 12 mg/dL (ref 6–20)
CO2: 20 mmol/L — ABNORMAL LOW (ref 22–32)
Calcium: 8.9 mg/dL (ref 8.9–10.3)
Chloride: 105 mmol/L (ref 98–111)
Creatinine, Ser: 0.98 mg/dL (ref 0.61–1.24)
GFR calc Af Amer: >60 mL/min (ref >60)
GFR calc non Af Amer: >60 mL/min (ref >60)
Glucose, Bld: 111 mg/dL — ABNORMAL HIGH (ref 70–99)
Potassium: 3.3 mmol/L — ABNORMAL LOW (ref 3.5–5.1)
Sodium: 137 mmol/L (ref 135–145)
Total Bilirubin: 0.8 mg/dL (ref 0.3–1.2)
Total Protein: 8.1 g/dL (ref 6.5–8.1)

## 2017-10-25 LAB — ACETAMINOPHEN LEVEL: Acetaminophen (Tylenol), Serum: <10 ug/mL — ABNORMAL LOW (ref 10–30)

## 2017-10-25 MED ORDER — VITAMIN B-1 100 MG PO TABS
100.0000 mg | ORAL_TABLET | Freq: Every day | ORAL | Status: DC
  Administered 2017-10-25: 100 mg via ORAL
  Filled 2017-10-25: qty 1

## 2017-10-25 MED ORDER — COLCHICINE 0.6 MG PO TABS: 0.6000 mg | ORAL_TABLET | Freq: Every day | ORAL | 0 refills | Status: DC

## 2017-10-25 MED ORDER — LORAZEPAM 2 MG/ML IJ SOLN: 0.0000 mg | Freq: Four times a day (QID) | INTRAMUSCULAR | Status: DC

## 2017-10-25 MED ORDER — THIAMINE HCL 100 MG/ML IJ SOLN: 100.0000 mg | Freq: Every day | INTRAMUSCULAR | Status: DC

## 2017-10-25 MED ORDER — IBUPROFEN 200 MG PO TABS
600.0000 mg | ORAL_TABLET | Freq: Three times a day (TID) | ORAL | Status: DC | PRN
  Administered 2017-10-25: 600 mg via ORAL
  Filled 2017-10-25: qty 3

## 2017-10-25 MED ORDER — COLCHICINE 0.6 MG PO TABS
0.6000 mg | ORAL_TABLET | Freq: Once | ORAL | Status: AC
  Administered 2017-10-25: 0.6 mg via ORAL
  Filled 2017-10-25: qty 1

## 2017-10-25 MED ORDER — BACITRACIN 500 UNIT/GM EX OINT
TOPICAL_OINTMENT | Freq: Two times a day (BID) | CUTANEOUS | Status: DC
  Administered 2017-10-25: 1 via TOPICAL
  Administered 2017-10-25: 11:00:00 via TOPICAL
  Filled 2017-10-25: qty 28

## 2017-10-25 MED ORDER — LORAZEPAM 1 MG PO TABS
0.0000 mg | ORAL_TABLET | Freq: Four times a day (QID) | ORAL | Status: DC
  Administered 2017-10-25 (×2): 1 mg via ORAL
  Filled 2017-10-25 (×2): qty 1

## 2017-10-25 MED ORDER — ALUM & MAG HYDROXIDE-SIMETH 200-200-20 MG/5ML PO SUSP: 30.0000 mL | Freq: Four times a day (QID) | ORAL | Status: DC | PRN

## 2017-10-25 MED ORDER — COLCHICINE 0.6 MG PO TABS: 0.6000 mg | ORAL_TABLET | Freq: Every day | ORAL | Status: DC

## 2017-10-25 MED ORDER — NICOTINE 21 MG/24HR TD PT24: 21.0000 mg | MEDICATED_PATCH | Freq: Every day | TRANSDERMAL | Status: DC

## 2017-10-25 MED ORDER — ONDANSETRON HCL 4 MG PO TABS: 4.0000 mg | ORAL_TABLET | Freq: Three times a day (TID) | ORAL | Status: DC | PRN

## 2017-10-25 NOTE — ED Triage Notes (Signed)
Pt stated "I asked the police to bring me here.  I did cocaine last @ 2000 and drank about 15 beers an hour ago.  That's an every day routine.  I'm tired of living like this.  I'm going to walk out into traffic.  I called my son last night & told him to take me to detox in [facility] @ [facility]."

## 2017-10-25 NOTE — ED Notes (Signed)
Bed: [facility] Expected date:  Expected time:  Means of arrival:  Comments: 28

## 2017-10-25 NOTE — Progress Notes (Signed)
Patient ID: Edgar Mooney, male   DOB: [birth year], 52 y.o.   MRN: [id number]  Please read notes placed in chart by Peer Support Specialist, Mason Jim. Pt has ben accepted to [facility] in [place] at 0100 and will be transported via [place], NP-C 10-25-2017       [phone]

## 2017-10-25 NOTE — ED Notes (Signed)
Pt to room #41. Pleasant on approach, behavior cooperative, pt voicing no complaints at this time. Denies SI/HI/AVH. Special checks q 15 mins in place for safety, video monitoring in place. Will continue to monitor.

## 2017-10-25 NOTE — ED Notes (Signed)
Pt directed to shower & provided shower items.

## 2017-10-25 NOTE — ED Provider Notes (Signed)
[facility] DEPT Provider Note: Georgena Spurling, MD, FACEP  CSN: [id number] MRN: [id number] ARRIVAL: 10/25/17 at [place]: [place]  Suicidal   HISTORY OF PRESENT ILLNESS  10/25/17 3:22 AM Edgar Mooney is a 52 y.o. male with a long-standing history of cocaine and alcohol abuse on a daily basis.  He admits to using both recently.  He was brought here by the police after deciding he no longer wants to live with his addictions.  He has thoughts of suicide with a plan to walk out into traffic.  He is currently homeless.  He would like to get into some kind of long-term treatment facility.  He is complaining of itching on his ankles and feet with resultant excoriations from scratching.  He is a regular smoker.  He also has gout and has been having some pain in his left ankle which is not currently severe.   Past Medical History:  Diagnosis Date  . Alcohol abuse   . Cocaine abuse ([place])   . Depression   . Gout   . Hep C w/o coma, chronic [facility]) diagnosed May 2016    History reviewed. No pertinent surgical history.  Family History  Problem Relation Age of Onset  . Cancer Mother   . Cancer Father     Social History   Tobacco Use  . Smoking status: Current Every Day Smoker    Packs/day: 1.00    Years: 25.00    Pack years: 25.00    Types: Cigarettes  . Smokeless tobacco: Never Used  Substance Use Topics  . Alcohol use: Yes    Alcohol/week: 75.6 oz    Types: 126 Cans of beer per week    Comment: 18 pack per day  . Drug use: Yes    Types: Cocaine, IV, Heroin, Marijuana    Comment: Used crack and cocaine 04/02/16    Prior to Admission medications   Medication Sig Start Date End Date Taking? Authorizing Provider  acetaminophen (TYLENOL) 325 MG tablet Take 2 tablets (650 mg total) by mouth every 6 (six) hours as needed for mild pain (or Fever >/= 101). Patient not taking: Reported on 04/03/2016 01/16/16   Robbie Lis, MD  buPROPion [facility] SR) 150  MG 12 hr tablet Take 2 tablets (300 mg total) by mouth daily. For depression Patient not taking: Reported on 04/03/2016 01/11/16   Lindell Spar I, NP  feeding supplement (BOOST / RESOURCE BREEZE) LIQD Take 1 Container by mouth 3 (three) times daily between meals. Patient not taking: Reported on 04/03/2016 01/16/16   Robbie Lis, MD  indomethacin (INDOCIN) 50 MG capsule Take 1 capsule (50 mg total) by mouth 2 (two) times daily with a meal. Patient not taking: Reported on 04/03/2016 01/31/16   Shary Decamp, PA-C  nicotine (NICODERM CQ - DOSED IN MG/24 HOURS) 21 mg/24hr patch Place 1 patch (21 mg total) onto the skin daily. For smoking cessation Patient not taking: Reported on 04/03/2016 01/11/16   Lindell Spar I, NP  predniSONE (DELTASONE) 20 MG tablet Take 3 tablets (60 mg total) by mouth daily with breakfast. Patient not taking: Reported on 04/03/2016 01/17/16   Robbie Lis, MD    Allergies Patient has no known allergies.   REVIEW OF SYSTEMS  Negative except as noted here or in the History of Present Illness.   PHYSICAL EXAMINATION  Initial Vital Signs Blood pressure (!) 122/94, pulse 80, temperature 98.7 F (37.1 C), temperature source Oral, resp. rate 18,  height 5\' 3"  (1.6 m), weight 65.8 kg (145 lb), SpO2 96 %.  Examination General: Well-developed, well-nourished male in no acute distress; appears older than age of record HENT: normocephalic; atraumatic Eyes: pupils equal, round and reactive to light; extraocular muscles intact Neck: supple Heart: regular rate and rhythm Lungs: clear to auscultation bilaterally Abdomen: soft; nondistended; nontender; no masses or hepatosplenomegaly; bowel sounds present Extremities: No deformity; full range of motion; pulses normal Neurologic: Awake, alert and oriented; motor function intact in all extremities and symmetric; no facial droop Skin: Warm and dry; excoriation of lower legs and feet consistent with scratching Psychiatric: Suicidal  ideation   RESULTS  Summary of this visit's results, reviewed by myself:   EKG Interpretation  Date/Time:    Ventricular Rate:    PR Interval:    QRS Duration:   QT Interval:    QTC Calculation:   R Axis:     Text Interpretation:        Laboratory Studies: Results for orders placed or performed during the hospital encounter of 10/25/17 (from the past 24 hour(s))  Comprehensive metabolic panel     Status: Abnormal   Collection Time: 10/25/17  1:55 AM  Result Value Ref Range   Sodium 137 135 - 145 mmol/L   Potassium 3.3 (L) 3.5 - 5.1 mmol/L   Chloride 105 98 - 111 mmol/L   CO2 20 (L) 22 - 32 mmol/L   Glucose, Bld 111 (H) 70 - 99 mg/dL   BUN 12 6 - 20 mg/dL   Creatinine, Ser 0.98 0.61 - 1.24 mg/dL   Calcium 8.9 8.9 - 10.3 mg/dL   Total Protein 8.1 6.5 - 8.1 g/dL   Albumin 3.9 3.5 - 5.0 g/dL   AST 55 (H) 15 - 41 U/L   ALT 59 (H) 0 - 44 U/L   Alkaline Phosphatase 65 38 - 126 U/L   Total Bilirubin 0.8 0.3 - 1.2 mg/dL   GFR calc non Af Amer >60 >60 mL/min   GFR calc Af Amer >60 >60 mL/min   Anion gap 12 5 - 15  Ethanol     Status: Abnormal   Collection Time: 10/25/17  1:55 AM  Result Value Ref Range   Alcohol, Ethyl (B) 215 (H) <13 mg/dL  Salicylate level     Status: None   Collection Time: 10/25/17  1:55 AM  Result Value Ref Range   Salicylate Lvl <0.8 2.8 - 30.0 mg/dL  Acetaminophen level     Status: Abnormal   Collection Time: 10/25/17  1:55 AM  Result Value Ref Range   Acetaminophen (Tylenol), Serum <10 (L) 10 - 30 ug/mL  cbc     Status: Abnormal   Collection Time: 10/25/17  1:55 AM  Result Value Ref Range   WBC 11.4 (H) 4.0 - 10.5 K/uL   RBC 4.69 4.22 - 5.81 MIL/uL   Hemoglobin 16.0 13.0 - 17.0 g/dL   HCT 43.8 39.0 - 52.0 %   MCV 93.4 78.0 - 100.0 fL   MCH 34.1 (H) 26.0 - 34.0 pg   MCHC 36.5 (H) 30.0 - 36.0 g/dL   RDW 12.3 11.5 - 15.5 %   Platelets 151 150 - 400 K/uL  Rapid urine drug screen (hospital performed)     Status: Abnormal   Collection Time:  10/25/17  1:55 AM  Result Value Ref Range   Opiates NONE DETECTED NONE DETECTED   Cocaine POSITIVE (A) NONE DETECTED   Benzodiazepines NONE DETECTED NONE DETECTED   Amphetamines NONE  DETECTED NONE DETECTED   Tetrahydrocannabinol NONE DETECTED NONE DETECTED   Barbiturates (A) NONE DETECTED    Result not available. Reagent lot number recalled by manufacturer.   Imaging Studies: No results found.  ED COURSE and MDM  Nursing notes and initial vitals signs, including pulse oximetry, reviewed.  Vitals:   10/25/17 0047 10/25/17 0109  BP: (!) 122/94   Pulse: 80   Resp: 18   Temp: 98.7 F (37.1 C)   TempSrc: Oral   SpO2: 96%   Weight:  65.8 kg (145 lb)  Height:  5\' 3"  (1.6 m)    PROCEDURES    ED DIAGNOSES     ICD-10-CM   1. Polysubstance abuse (HCC) F19.10   2. Alcoholic intoxication without complication ([place]) Q19.012   3. Scratching T14.8XXA   4. Suicidal ideation R45.851        [name], MD 10/25/17 [phone]

## 2017-10-25 NOTE — ED Notes (Signed)
Pt c/o bilat foot pain.  Pt presents with multiple open wounds to bilat LE's.

## 2017-10-25 NOTE — Patient Outreach (Signed)
ED Peer Support Specialist Patient Intake (Complete at intake & 30-60 Day Follow-up)  Name: Edgar Mooney  MRN: [id number]  Age: 52 y.o.   Date of Admission: 10/25/2017  Intake: Initial Comments:      Primary Reason Admitted: depression, poly substance use with alcohol and cocaine  Lab values: Alcohol/ETOH: Positive Positive UDS? Yes Amphetamines: No Barbiturates: No Benzodiazepines: No Cocaine: Yes Opiates: No Cannabinoids: No  Demographic information: Gender: Male Ethnicity: White Marital Status: Single Insurance Status: Uninsured/Self-pay Ecologist (Work Neurosurgeon, Physicist, medical, etc.: No Lives with: Alone Living situation: Homeless  Reported Patient History: Patient reported health conditions: Depression Patient aware of HIV and hepatitis status: Yes (comment)(Hepatitis C)  In past year, has patient visited ED for any reason? No  Number of ED visits:    Reason(s) for visit:    In past year, has patient been hospitalized for any reason? No  Number of hospitalizations:    Reason(s) for hospitalization:    In past year, has patient been arrested? No  Number of arrests:    Reason(s) for arrest:    In past year, has patient been incarcerated? No  Number of incarcerations:    Reason(s) for incarceration:    In past year, has patient received medication-assisted treatment?    In past year, patient received the following treatments: Residential treatment (non-hospital)([facility] 8 Months)  In past year, has patient received any harm reduction services? No  Did this include any of the following?    In past year, has patient received care from a mental health provider for diagnosis other than SUD? No  In past year, is this first time patient has overdosed? (has not overdosed)  Number of past overdoses:    In past year, is this first time patient has been hospitalized for an overdose? (has not overdosed)  Number of hospitalizations  for overdose(s):    Is patient currently receiving treatment for a mental health diagnosis? No  Patient reports experiencing difficulty participating in SUD treatment: No    Most important reason(s) for this difficulty?    Has patient received prior services for treatment? No  In past, patient has received services from following agencies:    Plan of Care:  Suggested follow up at these agencies/treatment centers: (Patient is interested in male detox bed at either [facility] or [facility]. CPSS will send the referral. )  Other information: CPSS met with the patient to provide substance use recovery support. CPSS will work on referrals to [facility] and keep the patient updated on the status of those referrals. CPSS will also provide other recovery resources along with CPSS contact information. CPSS strongly encouraged the patient to contact CPSS after discharge from the hospital for further recovery support.    Mason Jim, CPSS  10/25/2017 11:45 AM

## 2017-10-25 NOTE — BH Assessment (Addendum)
Assessment Note  Edgar Mooney is an 52 y.o. male who presents to the ED voluntarily. Pt reports he has been increasingly depressed and suicidal with a plan to walk into traffic. Pt identifies his stressors as being homeless and abusing substances daily. Pt states "can't keep living this way, I'd rather be dead." Pt states he has received treatment for alcohol dependence and drug abuse in the past. Pt denies that he has a current provider. TTS asked the pt if he experiences AVH and pt responded to this Probation officer by saying "you can hear them too?" Pt states he has ongoing Aubrey that started about 2 years ago. Pt states the voices say things like "can we do it again? How soon can we do it again?" Pt states if he leaves the ED, he is going to kill himself.   TTS observed blood on the pt's bed sheets and he states it came from his legs and feet. Pt shows this writer several scars and cuts on his legs and feet that he state come from scratching his feet due to severe itching and gout.   TTS consulted with Lindon Romp, NP who recommends inpt treatment. EDP Molpus, John, MD and pt's nurse Reagan, Audry Riles, RN have been advised of the disposition.   Diagnosis: MDD, single episode, severe, w/ psychosis; Alcohol use disorder, severe; Cocaine use disorder, severe  Past Medical History:  Past Medical History:  Diagnosis Date  . Alcohol abuse   . Cocaine abuse ([place])   . Depression   . Gout   . Hep C w/o coma, chronic [facility]) diagnosed May 2016    History reviewed. No pertinent surgical history.  Family History:  Family History  Problem Relation Age of Onset  . Cancer Mother   . Cancer Father     Social History:  reports that he has been smoking cigarettes.  He has a 25.00 pack-year smoking history. He has never used smokeless tobacco. He reports that he drinks about 75.6 oz of alcohol per week. He reports that he has current or past drug history. Drugs: Cocaine, IV, Heroin, and Marijuana.  Additional  Social History:  Alcohol / Drug Use Pain Medications: see MAR Prescriptions: see MAR Over the Counter: see MAR History of alcohol / drug use?: Yes Longest period of sobriety (when/how long): 2 years, 2011-2013 Negative Consequences of Use: Financial, Personal relationships, Work / School Substance #1 Name of Substance 1: Cocaine 1 - Age of First Use: 18 1 - Amount (size/oz): varies 1 - Frequency: 3x/week 1 - Duration: ongoing 1 - Last Use / Amount: 10/24/17 Substance #2 Name of Substance 2: Alcohol 2 - Age of First Use: 18 2 - Amount (size/oz): up to a case of beer 2 - Frequency: daily 2 - Duration: ongoing 2 - Last Use / Amount: 10/24/17  CIWA: CIWA-Ar BP: (!) 122/94 Pulse Rate: 80 COWS:    Allergies: No Known Allergies  Home Medications:  (Not in a hospital admission)  OB/GYN Status:  No LMP for male patient.  General Assessment Data Location of Assessment: [facility] TTS Assessment: In system Is this a Tele or Face-to-Face Assessment?: Face-to-Face Is this an Initial Assessment or a Re-assessment for this encounter?: Initial Assessment Marital status: Single Is patient pregnant?: No Pregnancy Status: No Living Arrangements: Other (Comment)(homeless) Can pt return to current living arrangement?: Yes Admission Status: Voluntary Is patient capable of signing voluntary admission?: Yes Referral Source: Self/Family/Friend Insurance type: none     Crisis Care Plan Living  Arrangements: Other (Comment)(homeless) Name of Psychiatrist: none Name of Therapist: none  Education Status Is patient currently in school?: No Is the patient employed, unemployed or receiving disability?: Unemployed  Risk to self with the past 6 months Suicidal Ideation: Yes-Currently Present Has patient been a risk to self within the past 6 months prior to admission? : No Suicidal Intent: No Has patient had any suicidal intent within the past 6 months prior to admission? : No Is patient at  risk for suicide?: Yes Suicidal Plan?: Yes-Currently Present Has patient had any suicidal plan within the past 6 months prior to admission? : Yes Specify Current Suicidal Plan: pt states he has a plan to walk into traffic  Access to Means: Yes Specify Access to Suicidal Means: pt has access to traffic  What has been your use of drugs/alcohol within the last 12 months?: daily alcohol and frequent cocaine use Previous Attempts/Gestures: No Triggers for Past Attempts: None known Intentional Self Injurious Behavior: None Family Suicide History: No Recent stressful life event(s): Turmoil (Comment), Financial Problems, Loss (Comment), Recent negative physical changes(homeless, gout, back pain) Persecutory voices/beliefs?: No Depression: Yes Depression Symptoms: Despondent, Insomnia, Guilt, Loss of interest in usual pleasures, Feeling worthless/self pity Substance abuse history and/or treatment for substance abuse?: Yes Suicide prevention information given to non-admitted patients: Not applicable  Risk to Others within the past 6 months Homicidal Ideation: No Does patient have any lifetime risk of violence toward others beyond the six months prior to admission? : No Thoughts of Harm to Others: No Current Homicidal Intent: No Current Homicidal Plan: No Access to Homicidal Means: No History of harm to others?: No Assessment of Violence: None Noted Does patient have access to weapons?: No Criminal Charges Pending?: No Does patient have a court date: No Is patient on probation?: No  Psychosis Hallucinations: Auditory, With command Delusions: None noted  Mental Status Report Appearance/Hygiene: In scrubs, Poor hygiene(dirty nails) Eye Contact: Good Motor Activity: Freedom of movement Speech: Logical/coherent Level of Consciousness: Alert Mood: Depressed, Helpless, Despair, Worthless, low self-esteem Affect: Depressed Anxiety Level: None Thought Processes: Coherent,  Relevant Judgement: Impaired Orientation: Person, Place, Time, Appropriate for developmental age, Situation Obsessive Compulsive Thoughts/Behaviors: None  Cognitive Functioning Concentration: Normal Memory: Remote Intact, Recent Intact Is patient IDD: No Is patient DD?: No Insight: Poor Impulse Control: Poor Appetite: Good Have you had any weight changes? : No Change Sleep: Decreased Total Hours of Sleep: 2 Vegetative Symptoms: Decreased grooming  ADLScreening [facility] Assessment Services) Patient's cognitive ability adequate to safely complete daily activities?: Yes Patient able to express need for assistance with ADLs?: Yes Independently performs ADLs?: Yes (appropriate for developmental age)  Prior Inpatient Therapy Prior Inpatient Therapy: Yes Prior Therapy Dates: 2017 Prior Therapy Facilty/Provider(s): [facility] Reason for Treatment: MDD  Prior Outpatient Therapy Prior Outpatient Therapy: No Does patient have an ACCT team?: No Does patient have Intensive In-House Services?  : No Does patient have [facility] services? : No Does patient have P4CC services?: No  ADL Screening (condition at time of admission) Patient's cognitive ability adequate to safely complete daily activities?: Yes Is the patient deaf or have difficulty hearing?: No Does the patient have difficulty seeing, even when wearing glasses/contacts?: No Does the patient have difficulty concentrating, remembering, or making decisions?: No Patient able to express need for assistance with ADLs?: Yes Does the patient have difficulty dressing or bathing?: No Independently performs ADLs?: Yes (appropriate for developmental age) Does the patient have difficulty walking or climbing stairs?: Yes(pt says he has gout )  Weakness of Legs: Both Weakness of Arms/Hands: None  Home Assistive Devices/Equipment Home Assistive Devices/Equipment: None    Abuse/Neglect Assessment (Assessment to be complete while patient is  alone) Abuse/Neglect Assessment Can Be Completed: Yes Physical Abuse: Denies Verbal Abuse: Denies Sexual Abuse: Denies Exploitation of patient/patient's resources: Denies Self-Neglect: Denies     Regulatory affairs officer (For Healthcare) Does Patient Have a Medical Advance Directive?: No Would patient like information on creating a medical advance directive?: No - Patient declined    Additional Information 1:1 In Past 12 Months?: No CIRT Risk: No Elopement Risk: No Does patient have medical clearance?: Yes     Disposition: TTS consulted with Lindon Romp, NP who recommends inpt treatment. EDP Molpus, John, MD and pt's nurse Reagan, Audry Riles, RN have been advised of the disposition.   Disposition Initial Assessment Completed for this Encounter: Yes Disposition of Patient: Admit Type of inpatient treatment program: Adult(per Lindon Romp, NP) Patient refused recommended treatment: No  On Site Evaluation by:   Reviewed with Physician:    Lyanne Co 10/25/2017 4:30 AM

## 2017-10-25 NOTE — Discharge Instructions (Signed)
To help you maintain a sober lifestyle, a substance abuse treatment program may be beneficial to you.  Contact one of the following facilities at your earliest opportunity to ask about enrolling: ° °RESIDENTIAL PROGRAMS: ° °     [facility] °     [address] °     [place], [place] [address] °     [phone] ° °     [facility] °     [address] °     [place], [place] [address] °     [phone] ° °     [facility] °     [address] °     [place], [place] [address] °     [phone] ° °OUTPATIENT PROGRAMS: ° °     [facility] °     [address] °     [place], [place] [address] °     [phone] °     New patients are seen at the walk-in clinic every Tuesday from 9:00 am - 12:00 pm °

## 2017-10-25 NOTE — ED Notes (Signed)
Pt stated "I want long term detox.  I've been to [facility], [facility] and [facility] before.  I'm tired of living like this."

## 2017-10-25 NOTE — ED Notes (Signed)
Pt is calm and cooperative. C/o pain in right ankle r/t gout.

## 2017-10-25 NOTE — BH Assessment (Signed)
[facility] Assessment Progress Note  Per Leotis Shames, MD, this pt does not require psychiatric hospitalization at this time.  Pt is to be discharged from [facility] with referral information for area substance abuse treatment providers.  This has been included in pt's discharge instructions.  Pt would also benefit from seeing Peer Support Specialists; they will be asked to speak to pt.  Pt's nurse, Diane, has been notified.  Edgar Mooney, [place] Triage Specialist [phone]

## 2017-10-25 NOTE — ED Notes (Signed)
SBAR Report received from previous nurse. Pt received calm and visible on unit. Pt denies current SI/ HI, A/V H, depression, anxiety, or pain at this time, and appears otherwise stable and free of distress. Pt reminded of camera surveillance, q 15 min rounds, and rules of the milieu. Will continue to assess. 

## 2017-10-25 NOTE — Patient Outreach (Signed)
[facility] talked to [facility] transportation to double check if they can transport the patient to [facility] in [place] by 1:00am. [facility] said the best time to call for transport for the patient would be at 10:30pm 10/25/17. Patient would be best to leave by 12:30am to be at [facility] by 1:00am.

## 2017-10-25 NOTE — Progress Notes (Signed)
TTS consulted with Edgar Romp, NP who recommends inpt treatment. EDP Molpus, John, MD and pt's nurse Reagan, Audry Riles, RN have been advised of the disposition. TTS to seek placement, [facility] at capacity for 500 hall type beds. Pt admitted to hearing command voices during the assessment.  Lind Covert, MSW, LCSW Therapeutic Triage Specialist  [phone]

## 2017-10-25 NOTE — Patient Outreach (Signed)
CPSS was accepted into [facility] in [place] for their detox program for alcohol. Patient needs to be at the treatment center tomorrow 10/26/17 by 1:00am. Patient will need transportation by [facility]. [facility] address: [address], [place], [place]. Phone number is [phone].

## 2017-11-12 ENCOUNTER — Encounter (HOSPITAL_BASED_OUTPATIENT_CLINIC_OR_DEPARTMENT_OTHER): Payer: Self-pay | Admitting: Emergency Medicine

## 2017-11-12 ENCOUNTER — Other Ambulatory Visit: Payer: Self-pay

## 2017-11-12 ENCOUNTER — Emergency Department (HOSPITAL_BASED_OUTPATIENT_CLINIC_OR_DEPARTMENT_OTHER)
Admission: EM | Admit: 2017-11-12 | Discharge: 2017-11-12 | Disposition: A | Discharge status: Home / Self Care | Payer: Self-pay | Attending: Emergency Medicine | Admitting: Emergency Medicine

## 2017-11-12 DIAGNOSIS — M10071 Idiopathic gout, right ankle and foot: Secondary | ICD-10-CM | POA: Insufficient documentation

## 2017-11-12 DIAGNOSIS — B353 Tinea pedis: Secondary | ICD-10-CM | POA: Insufficient documentation

## 2017-11-12 DIAGNOSIS — T07XXXA Unspecified multiple injuries, initial encounter: Secondary | ICD-10-CM

## 2017-11-12 DIAGNOSIS — Z8739 Personal history of other diseases of the musculoskeletal system and connective tissue: Secondary | ICD-10-CM

## 2017-11-12 MED ORDER — INDOMETHACIN 50 MG PO CAPS: 50.0000 mg | ORAL_CAPSULE | Freq: Two times a day (BID) | ORAL | 0 refills | Status: DC

## 2017-11-12 MED ORDER — KETOCONAZOLE 2 % EX CREA: 1.0000 "application " | TOPICAL_CREAM | Freq: Every day | CUTANEOUS | 0 refills | Status: DC

## 2017-11-12 NOTE — ED Triage Notes (Signed)
Reports athletes foot to both feet.  [facility]-states they have been spraying them with antifungal without relief.  Additionally reports gout "flare" in right foot.  States taking ibuprofen without relief.

## 2017-11-12 NOTE — Discharge Instructions (Signed)
You were seen here today for athletes foot.  You were noted to have multiple excoriations from scratching. Please take benadryl as needed for itching. You can take 25mg  every 12 hours. If you develop fever, vomiting, surrounding redness, heat, or drainage this may be the development of an infection and you should come back to be seen.  Please use Ketoconazole cream as prescribed. You will need to follow attached handout on foot hygiene to help with your athletes foot.  Please take indomethacin as prescribed. You should start to feel better from your gout flare in the next 24-48 hours.  Please establish care with a primary care proivder as soon as possible.  Contact a doctor if: You have another gout attack. You still have symptoms of a gout attack after 10 days of treatment. You have problems (side effects) because of your medicines. You have chills or a fever. You start vomiting You have burning pain when you pee (urinate). You have pain in your lower back or belly. You have swelling of your joints.  You cannot pee. If you develop worsening or new concerning symptoms you can return to the emergency department for re-evaluation.

## 2017-11-12 NOTE — ED Notes (Signed)
Pt verbalized understanding of discharge instructions.

## 2017-11-12 NOTE — ED Provider Notes (Signed)
[facility] EMERGENCY DEPARTMENT Provider Note   CSN: [id number] Arrival date & time: 11/12/17  7619     History   Chief Complaint Chief Complaint  Patient presents with  . Foot Problem    HPI Edgar Mooney is a 52 y.o. male with a past medical history of alcohol abuse, cocaine abuse, gout who presents the emergent department today for foot problems.  Patient reports that he has been dealing with athlete's foot for the last several months.  He is currently at a market has been sparingly with antifungal spray without any relief.  He notes that the areas are very itchy and he has been scratching to the point he has now made wounds on his right ankle.  He denies any associated drainage, fever, nausea/vomiting at home.  Patient also reports that he is starting to have a gout flare in his right big toe.  He notes this started this morning.  He denies any interventions for this prior to arrival.  He notes he usually takes ibuprofen for this but does not have access at this.  He denies any trauma, fever, joint swelling, inability to range the joint, numbness/tingling/weakness.  Patient denies anticoagulation use, prior GI bleed, peptic ulcer disease or kidney disease.  Patient has no other complaints at this time.  HPI  Past Medical History:  Diagnosis Date  . Alcohol abuse   . Cocaine abuse ([place])   . Depression   . Gout   . Hep C w/o coma, chronic [facility]) diagnosed May 2016    Patient Active Problem List   Diagnosis Date Noted  . Cocaine abuse with cocaine-induced mood disorder ([place]) 04/03/2016  . Depression   . Idiopathic thrombocytopenic purpura ([place]) 01/14/2016  . Polysubstance abuse ([place]) 01/14/2016  . Benzodiazepine abuse ([place]) 01/05/2016  . Major depressive disorder, recurrent episode, severe, with psychosis ([place]) 01/05/2016  . Gout 07/06/2015  . Hep C w/o coma, chronic ([place]) 07/06/2015  . HTN (hypertension) 12/01/2014  . Elevated blood uric acid level 12/01/2014  .  History of ETOH abuse 12/01/2014    History reviewed. No pertinent surgical history.      Home Medications    Prior to Admission medications   Medication Sig Start Date End Date Taking? Authorizing Provider  colchicine 0.6 MG tablet Take 1 tablet (0.6 mg total) by mouth daily. 10/25/17   Ethelene Hal, NP    Family History Family History  Problem Relation Age of Onset  . Cancer Mother   . Cancer Father     Social History Social History   Tobacco Use  . Smoking status: Current Every Day Smoker    Packs/day: 1.00    Years: 25.00    Pack years: 25.00    Types: Cigarettes  . Smokeless tobacco: Never Used  Substance Use Topics  . Alcohol use: Yes    Alcohol/week: 75.6 oz    Types: 126 Cans of beer per week    Comment: 18 pack per day  . Drug use: Yes    Types: Cocaine, IV, Heroin, Marijuana    Comment: Used crack and cocaine 04/02/16     Allergies   Patient has no known allergies.   Review of Systems Review of Systems  All other systems reviewed and are negative.    Physical Exam Updated Vital Signs BP (!) 167/95   Pulse (!) 59   Temp 97.7 F (36.5 C) (Oral)   Resp 16   Ht 5\' 2"  (1.575 m)   Wt  72.6 kg (160 lb)   SpO2 99%   BMI 29.26 kg/m   Physical Exam  Constitutional: He appears well-developed and well-nourished.  HENT:  Head: Normocephalic and atraumatic.  Right Ear: External ear normal.  Left Ear: External ear normal.  Eyes: Conjunctivae are normal. Right eye exhibits no discharge. Left eye exhibits no discharge. No scleral icterus.  Pulmonary/Chest: Effort normal. No respiratory distress.  Musculoskeletal:       Right ankle: No tenderness.       Left ankle: No tenderness. Achilles tendon normal.  No joint swelling, overlying erythema or heat.  Normal range of motion of all digits of feet as well as ankles bilaterally.  Right great toe with mild tenderness.  There is no overlying erythema, heat, joint swelling or bony abnormality.   Patient states is consistent with prior gout flares.  He has normal active and passive range of motion without difficulty.  Neurological: He is alert. He has normal strength. No sensory deficit.  Sensation intact to light touch.  Strength 5/5 bilaterally of the lower extremities.  Skin: Skin is warm and dry. Capillary refill takes less than 2 seconds. No erythema. No pallor.  Patient with multiple excoriations of the right lower extremity from itching.  There is no surrounding erythema, heat, induration or drainage.  Tinea pedis noticed of bilateral feet.  No other maceration of tissue or lesions between toes. Onychomycosis noted on toes.   Psychiatric: He has a normal mood and affect.  Nursing note and vitals reviewed.    ED Treatments / Results  Labs (all labs ordered are listed, but only abnormal results are displayed) Labs Reviewed - No data to display  EKG None  Radiology No results found.  Procedures Procedures (including critical care time)  Medications Ordered in ED Medications - No data to display   Initial Impression / Assessment and Plan / ED Course  I have reviewed the triage vital signs and the nursing notes.  Pertinent labs & imaging results that were available during my care of the patient were reviewed by me and considered in my medical decision making (see chart for details).     52 year old male presents with problem.  He reports he has had athlete's foot of the last several months it is not improved with antifungal spray.  He is noted to have excoriations of his feet from scratching.  There is no evidence of superimposed infection.  He is afebrile in the department.  There is no lower extremity edema.  Exam is with evidence of tinea pedis as well as onychomycosis.  There is no maceration of tissue between webspaces and again no evidence of superimposed infection.  Will treat with topical ketoconazole and refer to PCP for follow-up.  Patient with concerns for  gout.  States this is similar to prior.  He notes it began today.  He does have tenderness over his great toe on the right side.  There is no joint swelling, overlying erythema or heat.  He is afebrile in the department.  There is no concern for septic joint.  Patient denies any trauma to require x-ray.  Will treat with indomethacin.  No history of GI bleed, chronic kidney disease (most recent Cr wnl) or anticoagulation use though prevent him from being treated with this.  While the patient follow-up with PCP.  No other complaints at this time.  I advised the patient to follow-up with PCP this week. Specific return precautions discussed. Time was given for all questions to  be answered. The patient verbalized understanding and agreement with plan. The patient appears safe for discharge home.  Final Clinical Impressions(s) / ED Diagnoses   Final diagnoses:  History of gout  Multiple excoriations  Tinea pedis of both feet    ED Discharge Orders        Ordered    ketoconazole (NIZORAL) 2 % cream  Daily     11/12/17 1024    indomethacin (INDOCIN) 50 MG capsule  2 times daily with meals     11/12/17 1024       Jillyn Ledger, Hershal Coria 11/13/17 1457    Margette Fast, MD 11/13/17 1734

## 2017-12-19 ENCOUNTER — Ambulatory Visit: Payer: Self-pay | Admitting: Adult Health Nurse Practitioner

## 2017-12-19 VITALS — BP 157/87 | HR 74 | Temp 98.2°F | Ht 62.4 in | Wt 176.4 lb

## 2017-12-19 DIAGNOSIS — M109 Gout, unspecified: Secondary | ICD-10-CM

## 2017-12-19 DIAGNOSIS — Z0000 Encounter for general adult medical examination without abnormal findings: Secondary | ICD-10-CM

## 2017-12-19 DIAGNOSIS — I10 Essential (primary) hypertension: Secondary | ICD-10-CM

## 2017-12-19 MED ORDER — LISINOPRIL 20 MG PO TABS: 20.0000 mg | ORAL_TABLET | Freq: Every day | ORAL | 3 refills | Status: DC

## 2017-12-19 NOTE — Progress Notes (Signed)
  Subjective:    Patient ID: Edgar Mooney, male    DOB: 12/20/1965, 52 y.o.   MRN: [id number]  HPI  Edgar Mooney is a 52 yo M here to establish care with [place]. He has history of HTN, gout, chronic Hep C, depression, and substance abuse. He presents w/ cc of elevated WBC.  Elevated WBC: He endorses fatigue but denies feeling sick. HTN: BP elevated at 157/87. He was previously on meds and performed well. Gout: He is taking Colchicine 0.6mg  and Indomethacin 50mg  but ran out a few days ago.  Depression: He reports he has previously been on Prozac. He endorses feelings of depression 3x/week with sadness and low energy. He denies intention to hurt self or others.  Substance abuse: He has been sober for 2 mo  Patient Active Problem List   Diagnosis Date Noted  . Cocaine abuse with cocaine-induced mood disorder ([place]) 04/03/2016  . Depression   . Idiopathic thrombocytopenic purpura ([place]) 01/14/2016  . Polysubstance abuse ([place]) 01/14/2016  . Benzodiazepine abuse ([place]) 01/05/2016  . Major depressive disorder, recurrent episode, severe, with psychosis ([place]) 01/05/2016  . Gout 07/06/2015  . Hep C w/o coma, chronic ([place]) 07/06/2015  . HTN (hypertension) 12/01/2014  . Elevated blood uric acid level 12/01/2014  . History of ETOH abuse 12/01/2014   Allergies as of 12/19/2017   No Known Allergies     Medication List        Accurate as of 12/19/17  6:27 PM. Always use your most recent med list.          ketoconazole 2 % cream Commonly known as:  NIZORAL Apply 1 application topically daily.   lisinopril 20 MG tablet Commonly known as:  PRINIVIL,ZESTRIL Take 1 tablet (20 mg total) by mouth daily.        Review of Systems  All other systems reviewed and are negative.      Objective:   Physical Exam  Constitutional: He is oriented to person, place, and time. He appears well-developed and well-nourished.  Cardiovascular: Normal rate, regular rhythm, normal heart sounds and intact distal  pulses.  Pulmonary/Chest: Effort normal and breath sounds normal.  Abdominal: Soft. Bowel sounds are normal.  Musculoskeletal: He exhibits no edema.  Neurological: He is alert and oriented to person, place, and time.  Psychiatric: His behavior is normal. Judgment and thought content normal.  Vitals reviewed.   BP (!) 157/87   Pulse 74   Temp 98.2 F (36.8 C)   Ht 5' 2.4" (1.585 m)   Wt 176 lb 6.4 oz (80 kg)   BMI 31.85 kg/m        Assessment & Plan:   Routine labs tonight.   Elevated WBC: Recheck tonight  HTN: Not controlled. Begin Lisinopril 20mg  daily   Gout: Discontinue Indomethacin 50mg  and Colchicine 0.6mg . Monitor for s/s of flare- if flare will treat with Colchicine.   Depression: Refer to Education officer, museum, Water quality scientist for evaluation and recommendation.   Hep C: He is currently seeing GI for treatment.   F/u in 4 weeks for BP check, check Creatine levels, and evaluate effectiveness of treatment.

## 2017-12-20 LAB — CBC
Hematocrit: 41.7 % (ref 37.5–51.0)
Hemoglobin: 14.6 g/dL (ref 13.0–17.7)
MCH: 32.6 pg (ref 26.6–33.0)
MCHC: 35 g/dL (ref 31.5–35.7)
MCV: 93 fL (ref 79–97)
Platelets: 141 10*3/uL — ABNORMAL LOW (ref 150–450)
RBC: 4.48 x10E6/uL (ref 4.14–5.80)
RDW: 12.3 % (ref 12.3–15.4)
WBC: 7.5 10*3/uL (ref 3.4–10.8)

## 2017-12-20 LAB — COMPREHENSIVE METABOLIC PANEL
ALT: 174 IU/L — ABNORMAL HIGH (ref 0–44)
AST: 80 IU/L — ABNORMAL HIGH (ref 0–40)
Albumin/Globulin Ratio: 1.3 (ref 1.2–2.2)
Albumin: 4.5 g/dL (ref 3.5–5.5)
Alkaline Phosphatase: 61 IU/L (ref 39–117)
BUN/Creatinine Ratio: 25 — ABNORMAL HIGH (ref 9–20)
BUN: 19 mg/dL (ref 6–24)
Bilirubin Total: 0.8 mg/dL (ref 0.0–1.2)
CO2: 22 mmol/L (ref 20–29)
Calcium: 9.6 mg/dL (ref 8.7–10.2)
Chloride: 104 mmol/L (ref 96–106)
Creatinine, Ser: 0.76 mg/dL (ref 0.76–1.27)
GFR calc Af Amer: 121 mL/min/{1.73_m2} (ref >59)
GFR calc non Af Amer: 105 mL/min/{1.73_m2} (ref >59)
Globulin, Total: 3.5 g/dL (ref 1.5–4.5)
Glucose: 136 mg/dL — ABNORMAL HIGH (ref 65–99)
Potassium: 4.6 mmol/L (ref 3.5–5.2)
Sodium: 144 mmol/L (ref 134–144)
Total Protein: 8 g/dL (ref 6.0–8.5)

## 2017-12-20 LAB — LIPID PANEL
Chol/HDL Ratio: 6.8 ratio — ABNORMAL HIGH (ref 0.0–5.0)
Cholesterol, Total: 196 mg/dL (ref 100–199)
HDL: 29 mg/dL — ABNORMAL LOW (ref >39)
Triglycerides: 409 mg/dL — ABNORMAL HIGH (ref 0–149)

## 2017-12-20 LAB — TSH: TSH: 2.08 u[IU]/mL (ref 0.450–4.500)

## 2017-12-20 LAB — HEMOGLOBIN A1C
Est. average glucose Bld gHb Est-mCnc: 105 mg/dL
Hgb A1c MFr Bld: 5.3 % (ref 4.8–5.6)

## 2017-12-24 ENCOUNTER — Ambulatory Visit: Payer: Self-pay | Admitting: Licensed Clinical Social Worker

## 2017-12-24 DIAGNOSIS — F411 Generalized anxiety disorder: Secondary | ICD-10-CM

## 2017-12-24 DIAGNOSIS — F332 Major depressive disorder, recurrent severe without psychotic features: Secondary | ICD-10-CM

## 2017-12-24 NOTE — Progress Notes (Signed)
Total time:50 minutes Type of Service: [place] in clinic Interpretor:No.   SUBJECTIVE: Edgar Mooney is a 52 y.o. male  referred by nurse practicioner Linus Mako for symptoms of:  depression. Patient is accompanied by himself.  Patient reports the following symptoms and or concerns: anxiety, depression, fatigue, irritability, learning difficulty, loss of interest in favorite activities and sleep disturbance:  Duration of problem:  Edgar Mooney reports that his depression started in 2005 and is unable to identify the exact trigger for his symptoms. His symptoms include excessive worrying, difficulty sleeping, irritability, fatigue, feeling down and depressed, loss of interest in previously enjoyed activities, feeling bad about herself, difficulty concentrating, restless, and denies suicidal and homicidal thoughts. He notes that these symptoms are present nearly everyday. He describes symptoms of generalized anxiety that has started in the last six months as evidenced by feeling anxious, nervous, or on edge, worrying too much about different things, trouble relaxing, restlessness, irritability, and feeling afraid as if something awful might happen. He describes an increase in his anxiety since he stop drinking and smoking crack/cocaine on July 10 th 2019. He denies any symptoms of mania or hypo mania. He denies any symptoms of psychosis. He denies any history of trauma or abuse.  Impact on function: Edgar Mooney reports that his depression and anxiety affects his day to day. He notes that he worries a lot about his kids, getting a job, and financially getting back on his feet.  Current or Hx of substance use: Edgar Mooney reports that he started drinking alcohol at the age of 40; previously used to drink a 12 pack to a case a beer a day, and last use was July 10th 2019. He reports that he also started using crack/cocaine at the age of 9 or 65; used 80.00 worth a day through  smoking, and last use was July 10th 2019. He is currently in treatment at [facility] from July 23 rd 2019 to present.  PSYCHIATRIC HISTORY - Medical conditions that might explain or contribute to symptoms: Edgar Mooney reports that he has hepatitis C, gout, high blood pressure, and hypertension. He denies ever having any surgeries. 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 He denies having had any delays in his development. He reports that he had a learning disability as a kid and was prescribed Ritalin due to being hyperactive and having a poor attention span from third grade to eight or ninth grade. He notes that he was diagnosed with dyslexia as a child.  - Hospitalizations/ Outpatient therapy:  Edgar Mooney reports that the last time he was in [place] was two years ago. He reports that he saw a therapist twice and saw a psychiatrist and is unable to remember the names or dosages of his mediations. He reports that he was previously seen at [facility] in [place] in 2005 and was prescribed medications but is unable to remember the names and dosages. He reports that he was hospitalized shortly after that for approximately one week at [facility] for a low white blood cell count that he attributed to the psycho tropic medications prescribed at the time. He expressed interest in getting back on mental health medication.  -Pharmacotherapy: Previous medication history to be obtained through releases of information for [facility] and [facility].  -Family history of psychiatric issues: Edgar Mooney reports that both his mother and father were alcoholics. She reports that his grandfather on  his dad's side was also an alcoholic.      LIFE CONTEXT:  Edgar Mooney lives with [facility] for substance abuse treatment since since August 23rd to present and is hoping to stay for at least six months.    Since July 22nd. He has two children: 84 year old son, and 44  year old daughter. He has a 70 year old granddaughter. He has not previously been married.  Currently employed:No.  Edgar Mooney reports that he hasn't worked for the last six months and was homeless for awhile while he was in active addiction. 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 He was homeless prior to re-entering into recovery again on July 11th 2019 when he was in active addiction.  What is the last grade of school you completed? Edgar Mooney reports that he went to school until the tenth grade. He reports that he was diagnosed with a dyslexia and had a learning disability in school.  Are you active with community agencies/resources/homecare? Yes Agency Name: [place], synagogue, mosque or other faith based community? No  clubs or social organizations? Edgar Mooney resides at [facility] [place] [facility] for substance abuse treatment.  What do you do for fun?  Hobbies?  Interests? Edgar Mooney reports that he previously used to fish, camp, and hunt but he no longer really has any interest in those things anymore.  Recent Life changes: Edgar Mooney entered back into recovery on July 11th 2019 to present.   GOALS ADDRESSED:  Patient will reduce symptoms of: anxiety, depression, insomnia and stress; increase ability ES:PQZRAQ skills, healthy habits, self-management skills and stress reduction, will also :Increase healthy adjustment to current life circumstances.  INTERVENTIONS:Bio psychosocial assessment completed., Psychoeducation and/or Health Education, Mindfulness or Relaxation Training , Reflective listening, emotional support, crisis intervention/ stabilization, Standardized Assessments completed: PHQ 9= 19 ,indication of: severe depression. GAD-7= 18 indication of: moderate anxiety.   ISSUES DISCUSSED: Integrated care services, support system, previous and current coping skills, community resources , community support, things patient enjoy or use to enjoy  doing, depression, anxiety, history of substance abuse, recovery, family, upbringing, job situation, past treatment history, and interest in getting back on mental health medications.      ASSESSMENT:  Patient currently experiencing symptoms of depression and anxiety.  Symptoms exacerbated by recovery, living at [facility], and being unemployed.  Patient may benefit from, and is in agreement to receive further assessment and brief therapeutic interventions to assist with managing symptoms.   PLAN: . Patient will follow up with Julian Hy, LCSW . Behavioral recommendations: Recommendation for Mr. Heffron to start cognitive behavioral therapy with Julian Hy, LCSW for symptoms of anxiety, and depression. . Referral:[facility] Clinic), Case consultation will be conducted with Dr. Octavia Heir once records are received from past providers.    Warm Hand Off Completed.

## 2017-12-31 ENCOUNTER — Ambulatory Visit: Payer: Self-pay | Admitting: Licensed Clinical Social Worker

## 2018-01-01 ENCOUNTER — Telehealth: Payer: Self-pay | Admitting: Licensed Clinical Social Worker

## 2018-01-01 NOTE — Telephone Encounter (Signed)
Clinician attempted to reach out the client about rescheduling his missed appointment on Tuesday September 17th but the number on file went to [facility] recovery services.

## 2018-01-02 ENCOUNTER — Ambulatory Visit: Payer: Self-pay | Admitting: Licensed Clinical Social Worker

## 2018-01-02 DIAGNOSIS — F332 Major depressive disorder, recurrent severe without psychotic features: Secondary | ICD-10-CM

## 2018-01-02 DIAGNOSIS — F411 Generalized anxiety disorder: Secondary | ICD-10-CM

## 2018-01-02 NOTE — Progress Notes (Signed)
Subjective:  Patient ID: Edgar Mooney, male   DOB: 09-09-1965, 52 y.o.   MRN: [id number]    Increase emotional regulation  Mr. Vangieson  presents with depression. Onset of symptoms was in several years ago with unchanged unchanged. Symptoms have been occurringNot influenced by the time of the day.  Symptoms are currently rated moderate. Associated signs and symptoms include: attention difficulties, sadness, stubborn and feeling overwhelmed.Occupational concerns Other: [facility] .   He reports that he has been feeling stressed out. He explains that he has this paperwork hanging over his head that he has to complete from [facility] in order to move up in the next phase, have more privileges and incentives. He explains that if he can eventually make it to level 4 then he can be able to get a job. He notes that the paperwork is about his recovery but the counselor does not want yes or no answers to the questions. He explains that he has a problem with spelling, and writing. He notes that he should of moved up another phase by now but is putting it out. He explains that he has been feeling down and depressed due to mourning the people he met while in active addiction. He explains that people cared about him and were supportive. He denies suicidal and homicidal thoughts.   Therapeutic Interventions: Cognitive Behavioral therapy was utilized by the clinician focusing on the client's mood and affect on his behaviors. Clinician processed with the patient regarding how he has been doing since the last session. Clinician discussed with the client the reasons why he is stressed. Clinician explained that it sounds like he is avoiding completing his paperwork because its time consuming and difficult. Clinician explained that if its apart of the program that completing the paperwork is inevitable. Clinician suggested that the client have some read the questions out to him, verbally tell them the responses, and have them write it  down for him. Clinician explained that it sounds like he has solutions but is making excuses for doing what he needs to do. Clinician explained that the number one reason for being at [facility] is to work his recovery.   Return visit in 2 weeks.    Effectiveness:  New problem, with additional work-up planned (4). Progressing It is felt more time is needed for Interventions to work.  . Patient is fully  Other:  orientated to time and place. Patient's Appropriate into problems. Active. Thought process is  Coherent.Minimal: No identifiable suicidal ideation.  Patients presenting with no risk factors but with morbid ruminations; may be classified as minimal risk based on the severity of the depressive symptoms and None.   Homework: Recovery homework from [facility].   Plan: Follow up with Julian Hy, LCSW at [facility] in two weeks or earlier if needed.

## 2018-01-03 ENCOUNTER — Ambulatory Visit: Payer: No Typology Code available for payment source | Admitting: Pharmacy Technician

## 2018-01-03 DIAGNOSIS — Z79899 Other long term (current) drug therapy: Secondary | ICD-10-CM

## 2018-01-03 NOTE — Progress Notes (Signed)
Completed [facility] application and contract.  Patient agreed to all terms of the [facility] contract.    Patient approved to receive medication assistance [facility] as long as eligibility criteria continues to be met.    Provided patient with Civil engineer, contracting based on his particular needs.    [facility] [facility]

## 2018-01-14 ENCOUNTER — Ambulatory Visit: Payer: Self-pay | Admitting: Licensed Clinical Social Worker

## 2018-01-14 DIAGNOSIS — F411 Generalized anxiety disorder: Secondary | ICD-10-CM

## 2018-01-14 DIAGNOSIS — F333 Major depressive disorder, recurrent, severe with psychotic symptoms: Secondary | ICD-10-CM

## 2018-01-14 NOTE — Progress Notes (Signed)
Subjective:  Patient ID: Edgar Mooney, male   DOB: 30-Jul-1965, 52 y.o.   MRN: [id number]    Increase emotional regulation  Mr. Postle presents with anxiety. Onset of symptoms was on and off for several years with gradually improving improving. Symptoms have been occurringNot influenced by the time of the day.  Symptoms are currently rated moderate. Associated signs and symptoms include: attention difficulties, stubborn and feeling overwhelmed.Other: [facility].   He reports that he completed his paperwork to move up a level and is now on level 3. He notes that he turned his paperwork for level 4 and will be able to look for a job if accepted. He notes that he is a little nervous about whether or not his counselor will accept the work because he is unpredictable. He notes that this overall mood has been okay but got frustrated because one of the guys made a comment about something he was supposed to be doing and lt him know that he was upset. He explains that later on, he came to his senses and apologized to the [place]. He notes that another Alexy offered to give him 20.00 to purchase cigarettes and anything else he might need. He explains that this is the only money he has had since July 11 th and he explained that it lifted his spirits. He notes that things are a little better this week compared to last week. He notes that as long as his mind is occupied and he has something productive to do with his time that he has a good day. He denies suicidal and homicidal thoughts.   Therapeutic Interventions: Cognitive Behavioral therapy was utilized by the clinician focusing on the client's anxiety and affect on normal cognition. Clinician processed with the client regarding how he has been doing since the last follow up session. Clinician congratulated the patient on moving up to the next level at [facility] because the last session is anxiety was high because of struggling to get the paperwork done. Clinician discussed with  the client regarding that it sounds like having something productive to do and staying busy helps him to decrease his symptoms of anxiety and depression. Clinician explained that it sounds like he is doing better since the last follow up session. Clinician administered the PHQ 9 and GAD 7 to the patient.   Return visit in 2 weeks.    Effectiveness:  Review of last therapy session (1). Progressing It is felt more time is needed for Interventions to work.  . Patient is fully or not fully Other:  orientated to time and place. Patient's Appropriate into problems. Active. Thought process is  Coherent.Minimal: No identifiable suicidal ideation.  Patients presenting with no risk factors but with morbid ruminations; may be classified as minimal risk based on the severity of the depressive symptoms and None. PHQ 9 decreased from 18 to 9 and GAD 7 decreased from 19 to 13 since last administered on 12/24/17.   Homework: Stay positive, don't procrastinate, and comply with paperwork to continue to move up levels at [facility].   Plan: Follow up with Julian Hy, LCSW at [facility] in two weeks or earlier if needed.

## 2018-01-16 ENCOUNTER — Ambulatory Visit: Payer: Self-pay

## 2018-01-16 VITALS — BP 159/84 | HR 81 | Temp 98.2°F | Ht 63.0 in | Wt 184.6 lb

## 2018-01-16 DIAGNOSIS — I10 Essential (primary) hypertension: Secondary | ICD-10-CM

## 2018-01-16 MED ORDER — LISINOPRIL 40 MG PO TABS: 40.0000 mg | ORAL_TABLET | Freq: Every day | ORAL | 2 refills | Status: DC

## 2018-01-16 NOTE — Progress Notes (Unsigned)
  Patient: Edgar Mooney Male    DOB: June 18, 1965   52 y.o.   MRN: [id number] Visit Date: 01/16/2018  Today's Provider: [facility]   Chief Complaint  Patient presents with  . Follow-up    HTN f/u   Subjective:    HPI   He currently follows up with Nira Conn at our clinic for Mental issues. He was former client of [employer] in [place]. He is a current resident of [facility].   He has gouty episodes in his ankles and knees. He has been taking Allopurinol for about 2 months.  His anxiety is elevated today, which he r/t elevated blood pressure readings.   He follows up with [facility] in [place] for history of Hep C.   No Known Allergies Previous Medications   KETOCONAZOLE (NIZORAL) 2 % CREAM    Apply 1 application topically daily.   LISINOPRIL (PRINIVIL,ZESTRIL) 20 MG TABLET    Take 1 tablet (20 mg total) by mouth daily.    Review of Systems  Social History   Tobacco Use  . Smoking status: Current Every Day Smoker    Packs/day: 1.00    Years: 25.00    Pack years: 25.00    Types: Cigarettes  . Smokeless tobacco: Never Used  Substance Use Topics  . Alcohol use: Not Currently    Alcohol/week: 126.0 standard drinks    Types: 126 Cans of beer per week    Comment: 18 pack per day   Objective:   BP (!) 159/84 (BP Location: Left Arm, Patient Position: Sitting)   Pulse 81   Temp 98.2 F (36.8 C) (Oral)   Ht 5\' 3"  (1.6 m)   Wt 184 lb 9.6 oz (83.7 kg)   BMI 32.70 kg/m   Physical Exam      Assessment & Plan:      We will increase Lisinopril to 40 mg today.   Re-assess effectiveness in 1 month.   [facility]

## 2018-01-28 ENCOUNTER — Ambulatory Visit: Payer: Self-pay | Admitting: Licensed Clinical Social Worker

## 2018-02-11 ENCOUNTER — Telehealth: Payer: Self-pay | Admitting: Licensed Clinical Social Worker

## 2018-02-11 NOTE — Telephone Encounter (Signed)
Clinician reached out to the patient about rescheduling an appointment he missed for therapy with her. She left a message with the staff who stated the information would be passed along.

## 2018-02-20 ENCOUNTER — Ambulatory Visit: Payer: Self-pay

## 2018-02-27 ENCOUNTER — Ambulatory Visit: Payer: Self-pay | Admitting: Adult Health Nurse Practitioner

## 2018-02-27 DIAGNOSIS — E781 Pure hyperglyceridemia: Secondary | ICD-10-CM | POA: Insufficient documentation

## 2018-02-27 MED ORDER — ALLOPURINOL 100 MG PO TABS: 200.0000 mg | ORAL_TABLET | Freq: Every day | ORAL | 2 refills | Status: DC

## 2018-02-27 NOTE — Progress Notes (Signed)
  Patient: Edgar Mooney Male    DOB: Sep 13, 1965   52 y.o.   MRN: [id number] Visit Date: 02/27/2018  Today's Provider: Staci Acosta, NP   Chief Complaint  Patient presents with  . Follow-up  . Gout    Is taking allopurinol but is noticing symptoms of gout in elbow and ankle   Subjective:    HPI  Last visit Lisinopril was increased to 40mg  daily.  BP improved.     Pt states that since he has been off Indomethacin that he has noted increased gout flares despite taking allopurinol.   No Known Allergies Previous Medications   ALLOPURINOL (ZYLOPRIM) 100 MG TABLET    Take 100 mg by mouth daily.   INDOMETHACIN (INDOCIN) 50 MG CAPSULE    Take 50 mg by mouth 2 (two) times daily with a meal.   KETOCONAZOLE (NIZORAL) 2 % CREAM    Apply 1 application topically daily.   LISINOPRIL (PRINIVIL,ZESTRIL) 40 MG TABLET    Take 1 tablet (40 mg total) by mouth daily.    Review of Systems  All other systems reviewed and are negative.   Social History   Tobacco Use  . Smoking status: Current Every Day Smoker    Packs/day: 1.00    Years: 25.00    Pack years: 25.00    Types: Cigarettes  . Smokeless tobacco: Never Used  Substance Use Topics  . Alcohol use: Not Currently    Alcohol/week: 126.0 standard drinks    Types: 126 Cans of beer per week    Comment: 18 pack per day   Objective:   BP 138/76   Pulse 78   Temp 98.3 F (36.8 C)   Ht 5\' 3"  (1.6 m)   Wt 184 lb 4.8 oz (83.6 kg)   BMI 32.65 kg/m   Physical Exam  Constitutional: He is oriented to person, place, and time. He appears well-developed and well-nourished.  HENT:  Head: Normocephalic and atraumatic.  Cardiovascular: Normal rate and regular rhythm.  Pulmonary/Chest: Effort normal and breath sounds normal.  Abdominal: Soft. Bowel sounds are normal.  Neurological: He is alert and oriented to person, place, and time.  Skin: Skin is warm and dry.        Assessment & Plan:      HTN:  Improved.  Goal BP <140/80.   Continue current medication regimen.  Encourage low salt diet and exercise.     Increase Allopurinol to 200mg  daily.  Monitor for increased flares- will increase to 300mg  if necessary.   Reviewed labs.  Plans to start Hep C medications next week. FU with GI as indicated.   Hypertriglycerides:  Will check LDL direct today.  Encouraged diet modifications.   Staci Acosta, NP   [facility]

## 2018-02-27 NOTE — Patient Instructions (Signed)

## 2018-02-28 LAB — LDL CHOLESTEROL, DIRECT: LDL Direct: 94 mg/dL (ref 0–99)

## 2018-06-26 ENCOUNTER — Ambulatory Visit: Payer: Self-pay

## 2018-11-26 ENCOUNTER — Telehealth: Payer: Self-pay | Admitting: Pharmacy Technician

## 2018-11-26 NOTE — Telephone Encounter (Signed)
Patient failed to provide2020 financial documentation. No additional medication assistance will be provided by [facility] without the required proof of income documentation. Patient notified by letter.  Velda Shell, CPhT [facility]

## 2020-02-19 DIAGNOSIS — R39198 Other difficulties with micturition: Secondary | ICD-10-CM | POA: Diagnosis not present

## 2020-02-19 DIAGNOSIS — F17210 Nicotine dependence, cigarettes, uncomplicated: Secondary | ICD-10-CM | POA: Diagnosis not present

## 2020-02-19 DIAGNOSIS — K5900 Constipation, unspecified: Secondary | ICD-10-CM | POA: Diagnosis not present

## 2020-03-03 ENCOUNTER — Inpatient Hospital Stay (HOSPITAL_COMMUNITY)
Admission: EM | Admit: 2020-03-03 | Discharge: 2020-03-08 | DRG: 723 | Disposition: A | Discharge status: Home / Self Care | Payer: BC Managed Care – PPO | Attending: Internal Medicine | Admitting: Internal Medicine

## 2020-03-03 ENCOUNTER — Encounter (HOSPITAL_COMMUNITY): Payer: Self-pay | Admitting: Emergency Medicine

## 2020-03-03 ENCOUNTER — Emergency Department (HOSPITAL_COMMUNITY): Payer: BC Managed Care – PPO

## 2020-03-03 ENCOUNTER — Other Ambulatory Visit: Payer: Self-pay

## 2020-03-03 DIAGNOSIS — R0602 Shortness of breath: Secondary | ICD-10-CM | POA: Diagnosis not present

## 2020-03-03 DIAGNOSIS — K7460 Unspecified cirrhosis of liver: Secondary | ICD-10-CM | POA: Diagnosis present

## 2020-03-03 DIAGNOSIS — D630 Anemia in neoplastic disease: Secondary | ICD-10-CM | POA: Diagnosis present

## 2020-03-03 DIAGNOSIS — K2970 Gastritis, unspecified, without bleeding: Secondary | ICD-10-CM

## 2020-03-03 DIAGNOSIS — C61 Malignant neoplasm of prostate: Secondary | ICD-10-CM | POA: Diagnosis not present

## 2020-03-03 DIAGNOSIS — C419 Malignant neoplasm of bone and articular cartilage, unspecified: Secondary | ICD-10-CM | POA: Diagnosis not present

## 2020-03-03 DIAGNOSIS — F32A Depression, unspecified: Secondary | ICD-10-CM | POA: Diagnosis not present

## 2020-03-03 DIAGNOSIS — Z20822 Contact with and (suspected) exposure to covid-19: Secondary | ICD-10-CM | POA: Diagnosis present

## 2020-03-03 DIAGNOSIS — Z91138 Patient's unintentional underdosing of medication regimen for other reason: Secondary | ICD-10-CM | POA: Diagnosis not present

## 2020-03-03 DIAGNOSIS — F1411 Cocaine abuse, in remission: Secondary | ICD-10-CM | POA: Diagnosis present

## 2020-03-03 DIAGNOSIS — M109 Gout, unspecified: Secondary | ICD-10-CM | POA: Diagnosis not present

## 2020-03-03 DIAGNOSIS — Z87891 Personal history of nicotine dependence: Secondary | ICD-10-CM | POA: Diagnosis not present

## 2020-03-03 DIAGNOSIS — K219 Gastro-esophageal reflux disease without esophagitis: Secondary | ICD-10-CM | POA: Diagnosis present

## 2020-03-03 DIAGNOSIS — R079 Chest pain, unspecified: Secondary | ICD-10-CM | POA: Diagnosis not present

## 2020-03-03 DIAGNOSIS — R338 Other retention of urine: Secondary | ICD-10-CM | POA: Diagnosis not present

## 2020-03-03 DIAGNOSIS — Z791 Long term (current) use of non-steroidal anti-inflammatories (NSAID): Secondary | ICD-10-CM

## 2020-03-03 DIAGNOSIS — C801 Malignant (primary) neoplasm, unspecified: Secondary | ICD-10-CM | POA: Diagnosis not present

## 2020-03-03 DIAGNOSIS — F1011 Alcohol abuse, in remission: Secondary | ICD-10-CM | POA: Diagnosis not present

## 2020-03-03 DIAGNOSIS — C7951 Secondary malignant neoplasm of bone: Secondary | ICD-10-CM

## 2020-03-03 DIAGNOSIS — F1010 Alcohol abuse, uncomplicated: Secondary | ICD-10-CM | POA: Diagnosis present

## 2020-03-03 DIAGNOSIS — D649 Anemia, unspecified: Secondary | ICD-10-CM | POA: Diagnosis not present

## 2020-03-03 DIAGNOSIS — D509 Iron deficiency anemia, unspecified: Secondary | ICD-10-CM | POA: Diagnosis not present

## 2020-03-03 DIAGNOSIS — B182 Chronic viral hepatitis C: Secondary | ICD-10-CM | POA: Diagnosis present

## 2020-03-03 DIAGNOSIS — I10 Essential (primary) hypertension: Secondary | ICD-10-CM | POA: Diagnosis present

## 2020-03-03 DIAGNOSIS — M8458XA Pathological fracture in neoplastic disease, other specified site, initial encounter for fracture: Secondary | ICD-10-CM | POA: Diagnosis present

## 2020-03-03 DIAGNOSIS — R531 Weakness: Secondary | ICD-10-CM

## 2020-03-03 DIAGNOSIS — R42 Dizziness and giddiness: Secondary | ICD-10-CM | POA: Diagnosis present

## 2020-03-03 DIAGNOSIS — M898X9 Other specified disorders of bone, unspecified site: Secondary | ICD-10-CM | POA: Diagnosis not present

## 2020-03-03 DIAGNOSIS — F17210 Nicotine dependence, cigarettes, uncomplicated: Secondary | ICD-10-CM | POA: Diagnosis not present

## 2020-03-03 DIAGNOSIS — M50123 Cervical disc disorder at C6-C7 level with radiculopathy: Secondary | ICD-10-CM | POA: Diagnosis not present

## 2020-03-03 DIAGNOSIS — F3289 Other specified depressive episodes: Secondary | ICD-10-CM | POA: Diagnosis not present

## 2020-03-03 DIAGNOSIS — M48061 Spinal stenosis, lumbar region without neurogenic claudication: Secondary | ICD-10-CM | POA: Diagnosis not present

## 2020-03-03 DIAGNOSIS — D693 Immune thrombocytopenic purpura: Secondary | ICD-10-CM | POA: Diagnosis present

## 2020-03-03 DIAGNOSIS — C799 Secondary malignant neoplasm of unspecified site: Secondary | ICD-10-CM | POA: Diagnosis not present

## 2020-03-03 DIAGNOSIS — T504X6A Underdosing of drugs affecting uric acid metabolism, initial encounter: Secondary | ICD-10-CM | POA: Diagnosis not present

## 2020-03-03 DIAGNOSIS — R161 Splenomegaly, not elsewhere classified: Secondary | ICD-10-CM | POA: Diagnosis present

## 2020-03-03 DIAGNOSIS — M50122 Cervical disc disorder at C5-C6 level with radiculopathy: Secondary | ICD-10-CM | POA: Diagnosis not present

## 2020-03-03 DIAGNOSIS — G893 Neoplasm related pain (acute) (chronic): Secondary | ICD-10-CM | POA: Diagnosis present

## 2020-03-03 DIAGNOSIS — F1414 Cocaine abuse with cocaine-induced mood disorder: Secondary | ICD-10-CM | POA: Diagnosis not present

## 2020-03-03 DIAGNOSIS — D492 Neoplasm of unspecified behavior of bone, soft tissue, and skin: Secondary | ICD-10-CM | POA: Diagnosis not present

## 2020-03-03 DIAGNOSIS — K5900 Constipation, unspecified: Secondary | ICD-10-CM | POA: Diagnosis present

## 2020-03-03 DIAGNOSIS — D62 Acute posthemorrhagic anemia: Secondary | ICD-10-CM | POA: Diagnosis not present

## 2020-03-03 DIAGNOSIS — M5011 Cervical disc disorder with radiculopathy,  high cervical region: Secondary | ICD-10-CM | POA: Diagnosis not present

## 2020-03-03 DIAGNOSIS — M4807 Spinal stenosis, lumbosacral region: Secondary | ICD-10-CM | POA: Diagnosis not present

## 2020-03-03 LAB — RESPIRATORY PANEL BY RT PCR (FLU A&B, COVID)
Influenza A by PCR: NEGATIVE
Influenza B by PCR: NEGATIVE
SARS Coronavirus 2 by RT PCR: NEGATIVE

## 2020-03-03 LAB — IRON AND TIBC
Iron: 14 ug/dL — ABNORMAL LOW (ref 45–182)
Saturation Ratios: 5 % — ABNORMAL LOW (ref 17.9–39.5)
TIBC: 294 ug/dL (ref 250–450)
UIBC: 280 ug/dL

## 2020-03-03 LAB — URINALYSIS, ROUTINE W REFLEX MICROSCOPIC
Bilirubin Urine: NEGATIVE
Glucose, UA: NEGATIVE mg/dL
Hgb urine dipstick: NEGATIVE
Ketones, ur: NEGATIVE mg/dL
Leukocytes,Ua: NEGATIVE
Nitrite: NEGATIVE
Protein, ur: NEGATIVE mg/dL
Specific Gravity, Urine: 1.027 (ref 1.005–1.030)
pH: 6 (ref 5.0–8.0)

## 2020-03-03 LAB — COMPREHENSIVE METABOLIC PANEL
ALT: 8 U/L (ref 0–44)
AST: 14 U/L — ABNORMAL LOW (ref 15–41)
Albumin: 2.9 g/dL — ABNORMAL LOW (ref 3.5–5.0)
Alkaline Phosphatase: 148 U/L — ABNORMAL HIGH (ref 38–126)
Anion gap: 12 (ref 5–15)
BUN: 19 mg/dL (ref 6–20)
CO2: 21 mmol/L — ABNORMAL LOW (ref 22–32)
Calcium: 9 mg/dL (ref 8.9–10.3)
Chloride: 103 mmol/L (ref 98–111)
Creatinine, Ser: 0.98 mg/dL (ref 0.61–1.24)
GFR, Estimated: >60 mL/min (ref >60)
Glucose, Bld: 158 mg/dL — ABNORMAL HIGH (ref 70–99)
Potassium: 4 mmol/L (ref 3.5–5.1)
Sodium: 136 mmol/L (ref 135–145)
Total Bilirubin: 1.3 mg/dL — ABNORMAL HIGH (ref 0.3–1.2)
Total Protein: 6.5 g/dL (ref 6.5–8.1)

## 2020-03-03 LAB — CBC
HCT: 19.8 % — ABNORMAL LOW (ref 39.0–52.0)
Hemoglobin: 5.8 g/dL — CL (ref 13.0–17.0)
MCH: 27.4 pg (ref 26.0–34.0)
MCHC: 29.3 g/dL — ABNORMAL LOW (ref 30.0–36.0)
MCV: 93.4 fL (ref 80.0–100.0)
Platelets: 110 10*3/uL — ABNORMAL LOW (ref 150–400)
RBC: 2.12 MIL/uL — ABNORMAL LOW (ref 4.22–5.81)
RDW: 18 % — ABNORMAL HIGH (ref 11.5–15.5)
WBC: 5.9 10*3/uL (ref 4.0–10.5)
nRBC: 2.2 % — ABNORMAL HIGH (ref 0.0–0.2)

## 2020-03-03 LAB — PREPARE RBC (CROSSMATCH)

## 2020-03-03 LAB — POC OCCULT BLOOD, ED: Fecal Occult Bld: NEGATIVE

## 2020-03-03 LAB — ABO/RH: ABO/RH(D): A POS

## 2020-03-03 LAB — FERRITIN: Ferritin: 1319 ng/mL — ABNORMAL HIGH (ref 24–336)

## 2020-03-03 LAB — TROPONIN I (HIGH SENSITIVITY)
Troponin I (High Sensitivity): 22 ng/L — ABNORMAL HIGH (ref <18)
Troponin I (High Sensitivity): 19 ng/L — ABNORMAL HIGH (ref <18)

## 2020-03-03 IMAGING — CT CT ANGIO CHEST-ABD-PELV FOR DISSECTION W/ AND WO/W CM
2 of 7 series · 14 of 46 positions shown, 16 images · non-contrast
Comparison: None.

CLINICAL DATA: Abdominal pain with acute aortic dissection
suspected

EXAM:
CT ANGIOGRAPHY CHEST, ABDOMEN AND PELVIS
TECHNIQUE: Non-contrast CT of the chest was initially obtained.

[Series 6: dissection 3.0 i30f 3 · axial · 0.63mm/px · z∈[+693,+1224]mm · 11 of 201 slices shown, 13 images]
[im 12/201  soft-tissue]
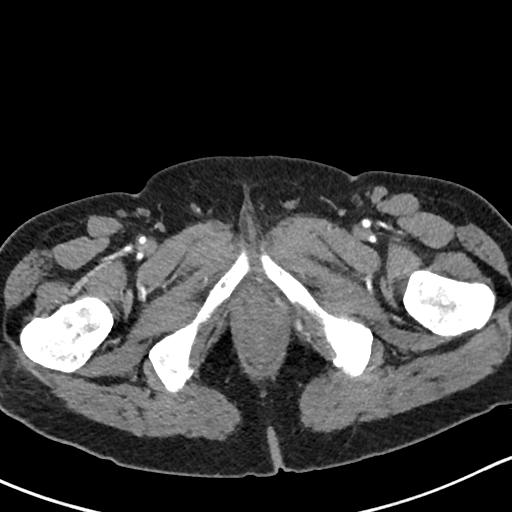
[im 12/201  bone]
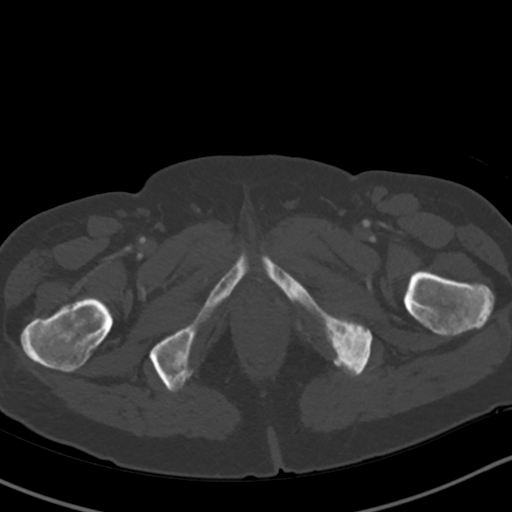
[im 34/201  soft-tissue]
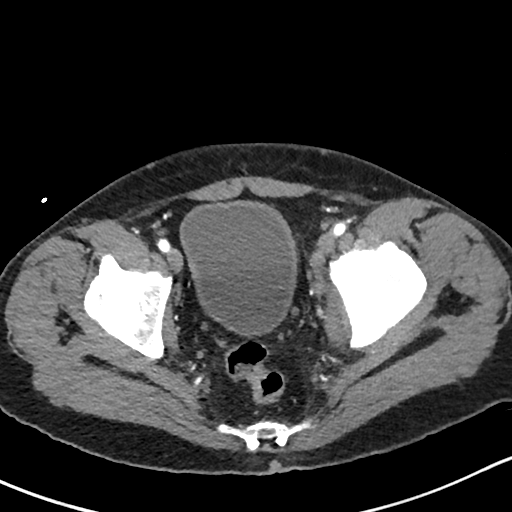
[im 45/201  soft-tissue]
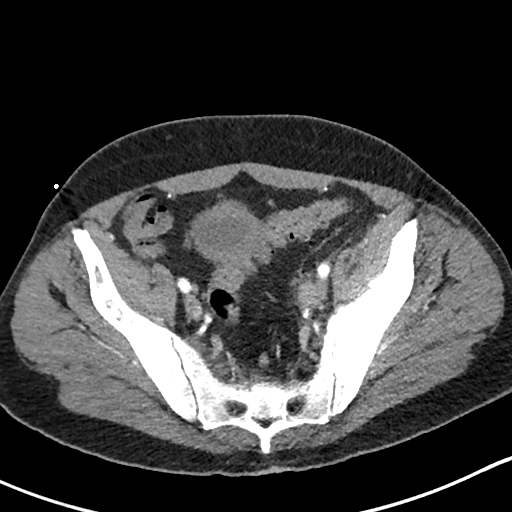
[im 67/201  soft-tissue]
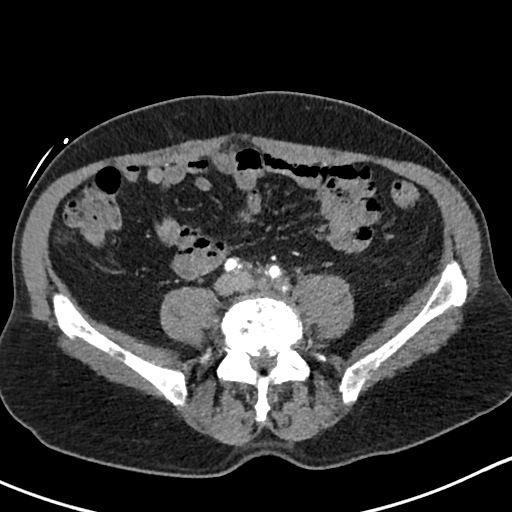
[im 78/201  soft-tissue]
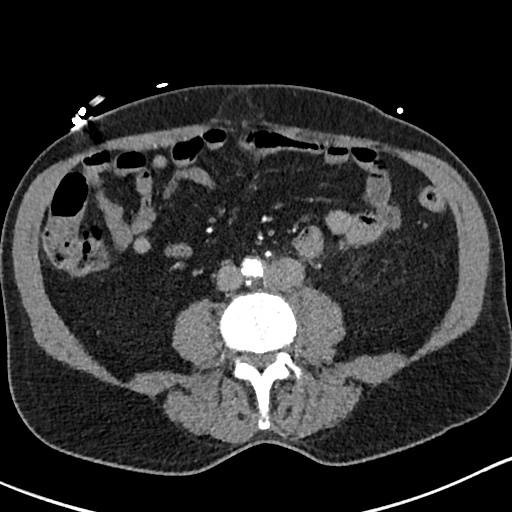
[im 101/201  soft-tissue]
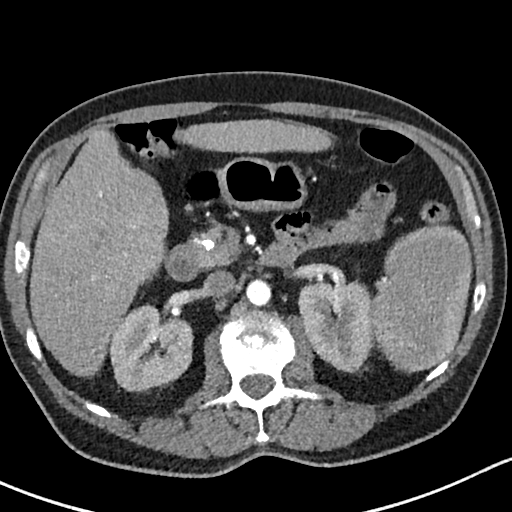
[im 123/201  soft-tissue]
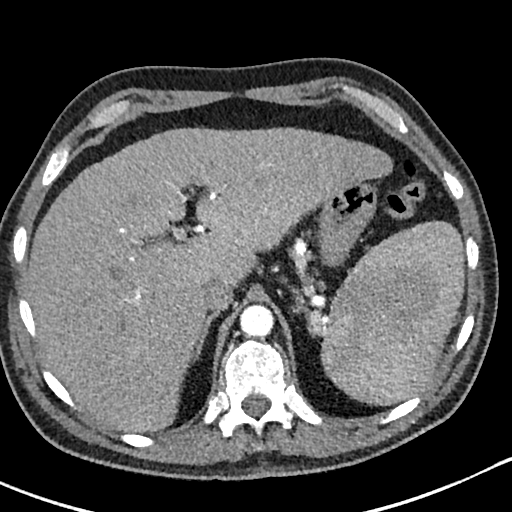
[im 134/201  soft-tissue]
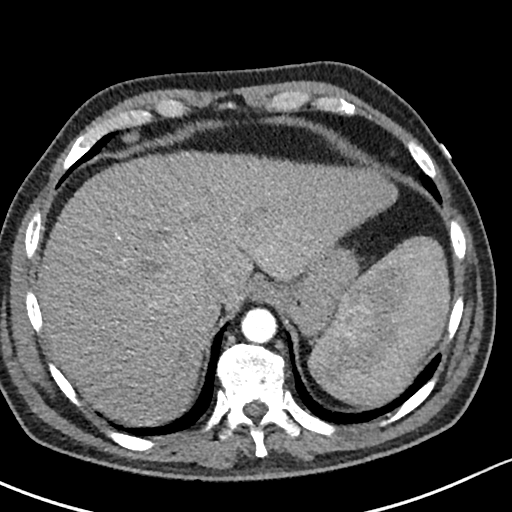
[im 156/201  soft-tissue]
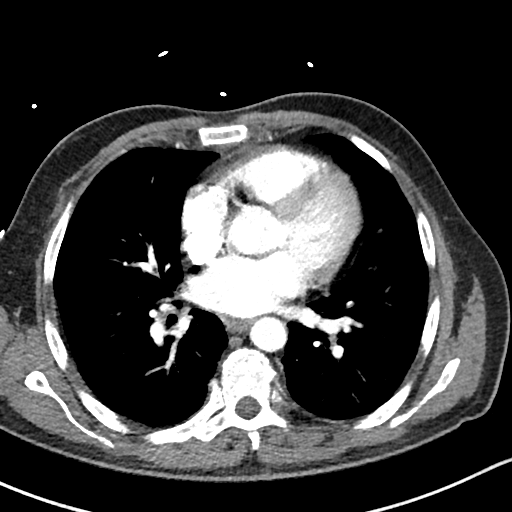
[im 156/201  bone]
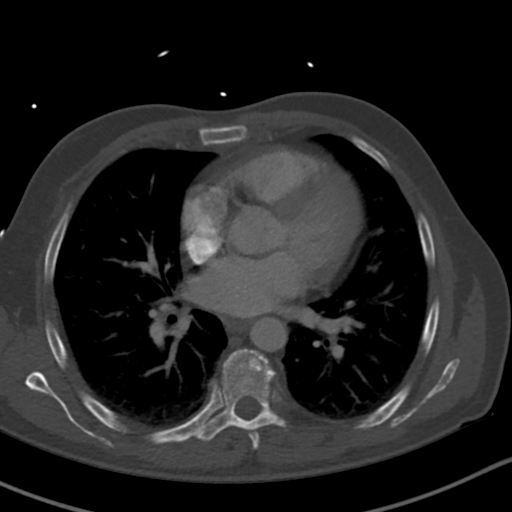
[im 167/201  soft-tissue]
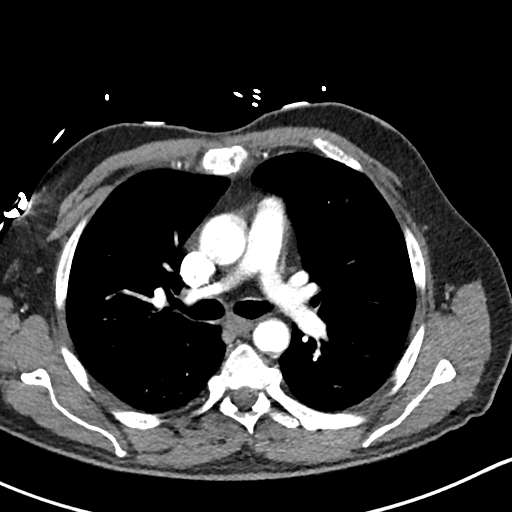
[im 189/201  soft-tissue]
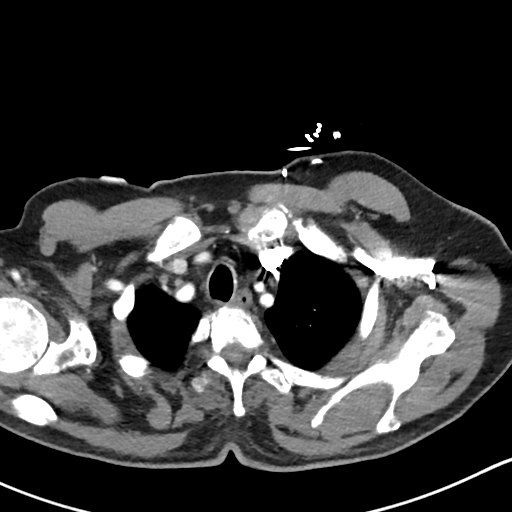

[Series 10: coronals · coronal · 0.72mm/px · 3 of 132 slices shown]
[im 33/132  soft-tissue]
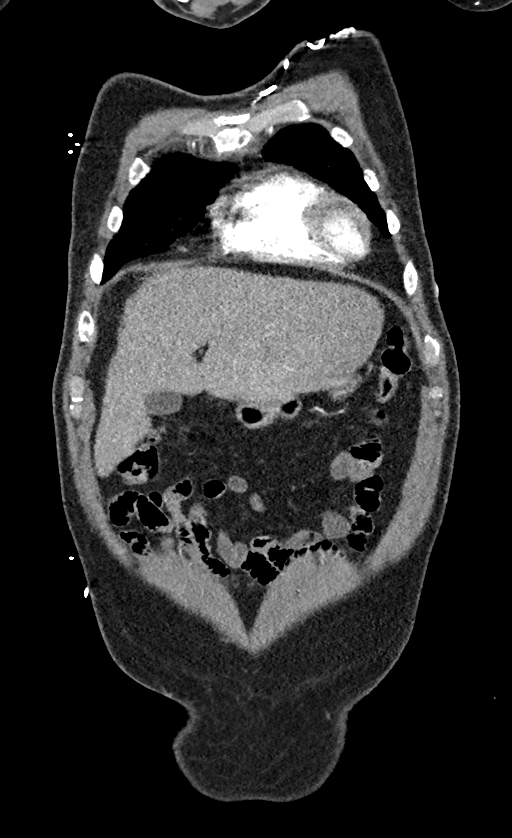
[im 66/132  soft-tissue]
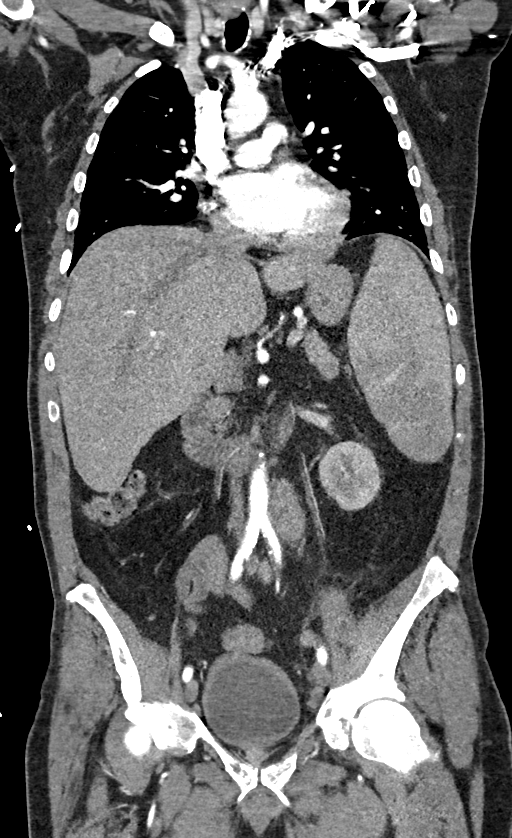
[im 99/132  soft-tissue]
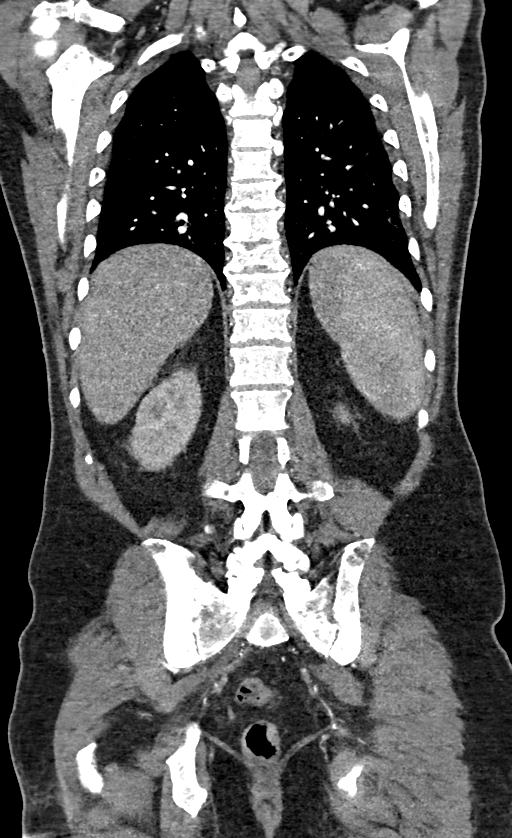

[14 of 46 positions shown; findings below may reference images not displayed]

Multidetector CT imaging through the chest, abdomen and pelvis was
performed using the standard protocol during bolus administration of
intravenous contrast. Multiplanar reconstructed images and MIPs were
obtained and reviewed to evaluate the vascular anatomy.

CONTRAST:  100mL OMNIPAQUE IOHEXOL 350 MG/ML SOLN
FINDINGS: CTA CHEST FINDINGS

Cardiovascular: No intramural aortic hematoma on noncontrast phase.
Normal heart size. No pericardial effusion.

Mediastinum/Nodes: Negative for adenopathy or mass.

Lungs/Pleura: Subpleural nodule in the anterior right chest which is
related to a rib lesion. No pulmonary nodule or consolidation

Musculoskeletal: Diffuse heterogeneous bony density patchy areas of
sclerosis and lucency. Pathologic anterior right third rib fracture.
Apparent extraosseous tumor from the anterior right second rib.

Review of the MIP images confirms the above findings.

CTA ABDOMEN AND PELVIS FINDINGS

VASCULAR

Aorta: Atheromatous wall thickening.  No dissection or aneurysm

Celiac: Patent without evidence of aneurysm, dissection, vasculitis
or significant stenosis.

SMA: Moderate atheromatous narrowing proximally. No acute finding
including branch occlusion.

Renals: Single bilateral renal arteries which are smooth and widely
patent.

IMA: Patent

Inflow: Atheromatous plaque without flow limiting stenosis or
dissection.

Veins: Negative in the arterial phase

Review of the MIP images confirms the above findings.

NON-VASCULAR

Hepatobiliary: Lobulated liver surface compatible with cirrhosis.
There is known hepatitis C.no evidence of biliary obstruction or
stone.

Pancreas: Coarse calcification at the head, likely post
inflammatory. No acute finding.

Spleen: Generous size in the setting of presumed portal
hypertension.

Adrenals/Urinary Tract: Negative adrenals. No hydronephrosis or
stone. Unremarkable bladder.

Stomach/Bowel:  No obstruction. Mild distal colonic diverticulosis.

Lymphatic: Lymphadenopathy in the pelvis and lower abdomen which is
retroperitoneal. A rounded node left of the distal aorta measures 21
mm in short axis. A left external iliac node measures 2 cm in short
axis on [DATE]

Reproductive:Enlarged prostate with eccentric left nodularity at the
bladder base.

Other: No ascites or pneumoperitoneum.

Musculoskeletal: Extensive sclerotic metastatic disease in the
lumbar spine and pelvis with aggressive periosteal reaction along
the bilateral iliac fossa. Multilevel lumbar spine degeneration with
spinal stenosis at L3-4 and below. Tumor may infiltrate the right
eccentric and ventral sacral spinal canal at the level of S1. No
acute fracture.

Review of the MIP images confirms the above findings.
IMPRESSION: 1. Widespread osseous metastatic disease and lower retroperitoneal
adenopathy. Favor prostate primary.
2. Pathologic right anterior third rib fracture.
3. Probable spinal canal tumor at the level of S1.
4. Cirrhosis with splenomegaly.
5. No acute aortic finding.

## 2020-03-03 IMAGING — DX DG CHEST 2V
2 series · 2 of 2 positions shown · non-contrast
Comparison: [DATE]

CLINICAL DATA: Chest pain, shortness of breath

EXAM:
CHEST - 2 VIEW

[chest pa]
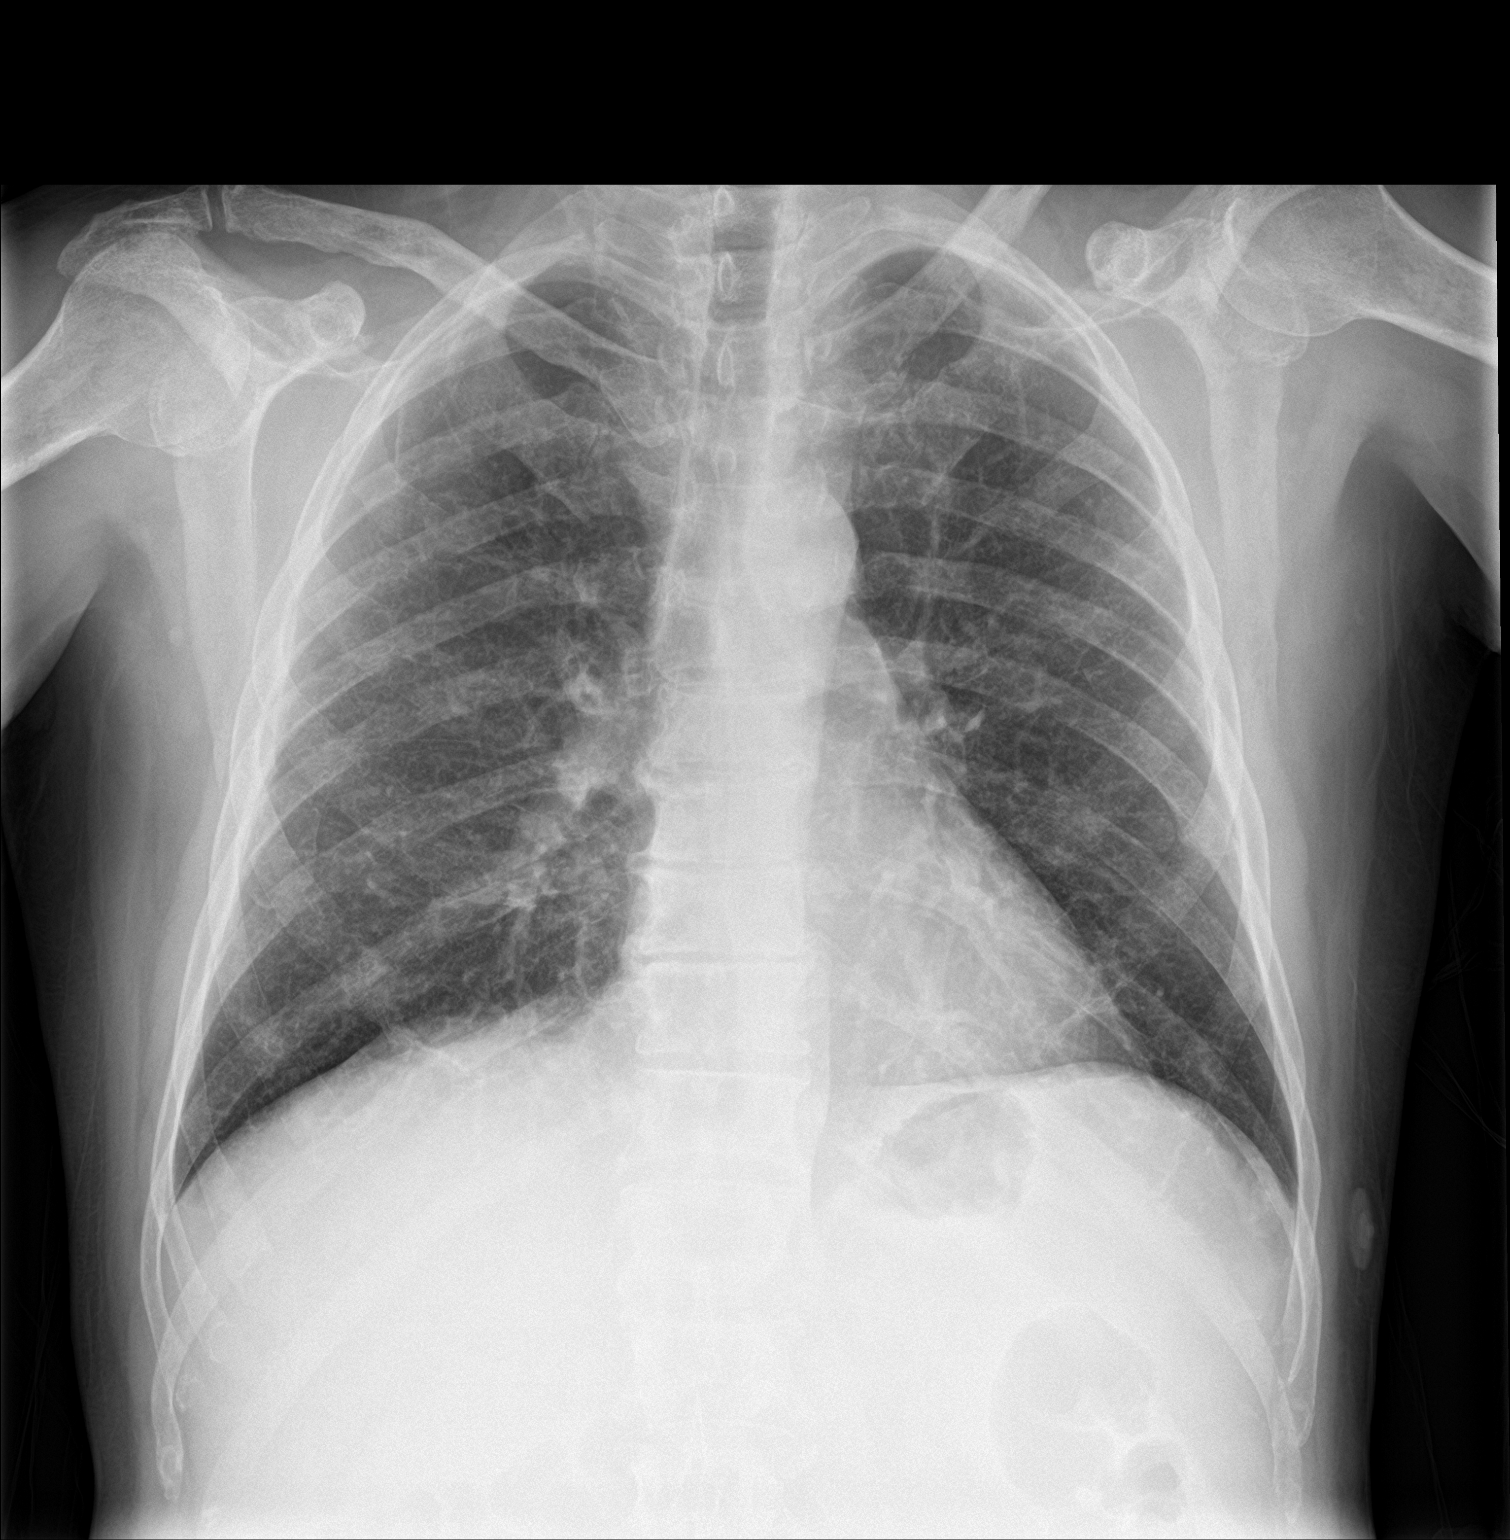

[chest lat]
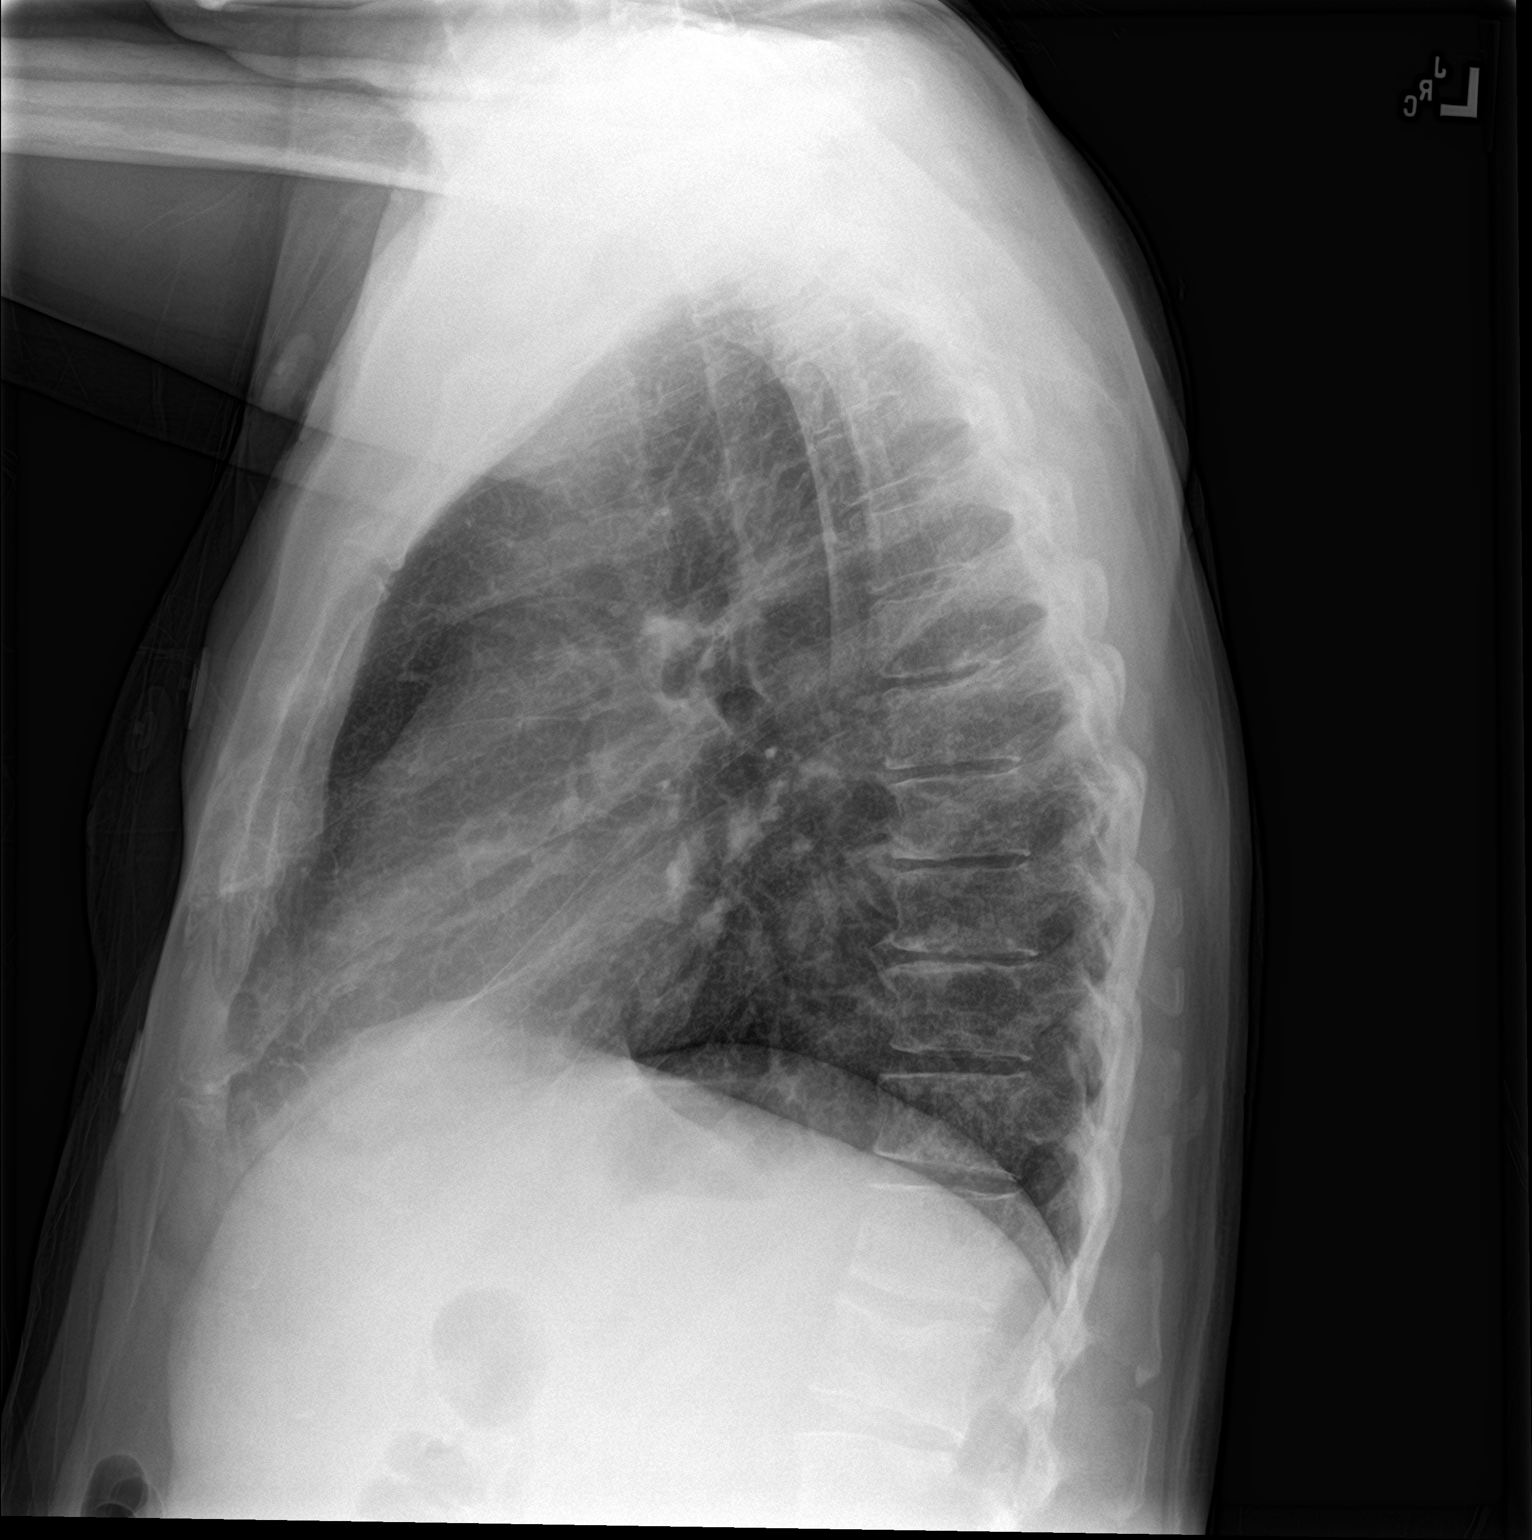

[2 of 2 positions shown; findings below may reference images not displayed]

FINDINGS: The heart size and mediastinal contours are within normal limits.
Mildly coarsened interstitial markings bilaterally. No focal
airspace consolidation, pleural effusion, or pneumothorax.
Degenerative changes of the bilateral shoulders.
IMPRESSION: Mildly coarsened interstitial markings bilaterally, which may
reflect bronchitic type lung changes. No focal airspace
consolidation.

## 2020-03-03 MED ORDER — SODIUM CHLORIDE 0.9 % IV SOLN: 10.0000 mL/h | Freq: Once | INTRAVENOUS | Status: DC

## 2020-03-03 MED ORDER — LORAZEPAM 2 MG/ML IJ SOLN: 0.5000 mg | Freq: Once | INTRAMUSCULAR | Status: DC

## 2020-03-03 MED ORDER — SODIUM CHLORIDE 0.9 % IV SOLN
80.0000 mg | Freq: Once | INTRAVENOUS | Status: AC
  Administered 2020-03-03: 80 mg via INTRAVENOUS
  Filled 2020-03-03: qty 80

## 2020-03-03 MED ORDER — ONDANSETRON HCL 4 MG/2ML IJ SOLN
4.0000 mg | Freq: Four times a day (QID) | INTRAMUSCULAR | Status: DC | PRN
  Administered 2020-03-04: 4 mg via INTRAVENOUS
  Filled 2020-03-03: qty 2

## 2020-03-03 MED ORDER — IOHEXOL 350 MG/ML SOLN
100.0000 mL | Freq: Once | INTRAVENOUS | Status: AC | PRN
  Administered 2020-03-03: 100 mL via INTRAVENOUS

## 2020-03-03 MED ORDER — ACETAMINOPHEN 650 MG RE SUPP: 650.0000 mg | Freq: Four times a day (QID) | RECTAL | Status: DC | PRN

## 2020-03-03 MED ORDER — ONDANSETRON HCL 4 MG PO TABS: 4.0000 mg | ORAL_TABLET | Freq: Four times a day (QID) | ORAL | Status: DC | PRN

## 2020-03-03 MED ORDER — ACETAMINOPHEN 325 MG PO TABS
650.0000 mg | ORAL_TABLET | Freq: Four times a day (QID) | ORAL | Status: DC | PRN
  Administered 2020-03-03: 650 mg via ORAL
  Filled 2020-03-03: qty 2

## 2020-03-03 MED ORDER — TAMSULOSIN HCL 0.4 MG PO CAPS
0.4000 mg | ORAL_CAPSULE | Freq: Every day | ORAL | Status: DC
  Administered 2020-03-03 – 2020-03-07 (×5): 0.4 mg via ORAL
  Filled 2020-03-03 (×5): qty 1

## 2020-03-03 MED ORDER — SODIUM CHLORIDE 0.9 % IV SOLN: INTRAVENOUS | Status: DC

## 2020-03-03 MED ORDER — FERROUS GLUCONATE 324 (38 FE) MG PO TABS
324.0000 mg | ORAL_TABLET | Freq: Two times a day (BID) | ORAL | Status: DC
  Administered 2020-03-03 – 2020-03-08 (×9): 324 mg via ORAL
  Filled 2020-03-03 (×10): qty 1

## 2020-03-03 MED ORDER — ALUM & MAG HYDROXIDE-SIMETH 200-200-20 MG/5ML PO SUSP
30.0000 mL | Freq: Once | ORAL | Status: AC
  Administered 2020-03-03: 30 mL via ORAL
  Filled 2020-03-03: qty 30

## 2020-03-03 MED ORDER — MORPHINE SULFATE (PF) 4 MG/ML IV SOLN
4.0000 mg | Freq: Once | INTRAVENOUS | Status: AC
  Administered 2020-03-03: 4 mg via INTRAVENOUS
  Filled 2020-03-03: qty 1

## 2020-03-03 MED ORDER — FAMOTIDINE IN NACL 20-0.9 MG/50ML-% IV SOLN
20.0000 mg | Freq: Once | INTRAVENOUS | Status: AC
  Administered 2020-03-03: 20 mg via INTRAVENOUS
  Filled 2020-03-03: qty 50

## 2020-03-03 MED ORDER — HYDROMORPHONE HCL 1 MG/ML IJ SOLN
0.5000 mg | INTRAMUSCULAR | Status: DC | PRN
  Administered 2020-03-03 – 2020-03-04 (×8): 1 mg via INTRAVENOUS
  Filled 2020-03-03 (×9): qty 1

## 2020-03-03 NOTE — Progress Notes (Signed)
Patient had a temp of 100.7 before blood administration, MD was notified and given a verbal order to give tylenol before giving blood and proceed with giving blood. RN followed this order and will continue to monitor this patient.

## 2020-03-03 NOTE — ED Notes (Signed)
C/o 8/8 pain in back and hips

## 2020-03-03 NOTE — ED Provider Notes (Signed)
[facility] Provider Note   CSN: [id number] Arrival date & time: 03/03/20  6237     History Chief Complaint  Patient presents with  . Chest Pain    [facility] is a 54 y.o. male history hepatitis C, former cocaine and alcohol abuse (reports a 62-month sober), major depressive order, smoking, presented emergency department with multiple complaints.  The patient reports he has had neck pain radiculopathy and numbness intermittently down his right arm for the past 2 to 3 months.  Approximately 1 to 2 weeks ago, he began having pain between his shoulder blades epigastric pain, as well as pain in his hips.  He says he went to an outside hospital near [facility] where they did a likely CT scan, told him that he had "degeneration around my hips" but everything else was normal.  He presents to the ED today complaining that he continues having lightheadedness, pain in his shoulders and his hips, and now a new epigastric pain that radiates from his epigastrium up to his throat.  He feels like a throbbing pain "like someone's drug in her knuckles across my chest".  He says he suffers from acid reflux but this feels differently.  He reports he has been taking ibuprofen 800 mg four times a day for the past 30 days for the pain in his shoulder.  He denies any frank blood in his stool.  He is not on blood thinners.  No hemoptysis or asymmetric LE edema. Patient denies personal or family history of DVT or PE. No recent hormone use (including OCP); travel for >6 hours; prolonged immobilization for greater than 3 days; surgeries or trauma in the last 4 weeks; or malignancy with treatment within 6 months.  He also reports to me unintentional 30 pound weight loss over the past several months.   HPI     Past Medical History:  Diagnosis Date  . Alcohol abuse   . Cocaine abuse ([place])   . Depression   . Gout   . Hep C w/o coma, chronic [facility]) diagnosed May 2016     Patient Active Problem List   Diagnosis Date Noted  . Anemia 03/03/2020  . Hypertriglyceridemia 02/27/2018  . Cocaine abuse with cocaine-induced mood disorder ([place]) 04/03/2016  . Depression   . Idiopathic thrombocytopenic purpura ([place]) 01/14/2016  . Polysubstance abuse ([place]) 01/14/2016  . Benzodiazepine abuse ([place]) 01/05/2016  . Major depressive disorder, recurrent episode, severe, with psychosis ([place]) 01/05/2016  . Gout 07/06/2015  . Hep C w/o coma, chronic ([place]) 07/06/2015  . HTN (hypertension) 12/01/2014  . Elevated blood uric acid level 12/01/2014  . History of ETOH abuse 12/01/2014    History reviewed. No pertinent surgical history.     Family History  Problem Relation Age of Onset  . Cancer Mother   . Cancer Father     Social History   Tobacco Use  . Smoking status: Current Every Day Smoker    Packs/day: 1.00    Years: 25.00    Pack years: 25.00    Types: Cigarettes  . Smokeless tobacco: Never Used  Substance Use Topics  . Alcohol use: Not Currently    Alcohol/week: 126.0 standard drinks    Types: 126 Cans of beer per week    Comment: 18 pack per day  . Drug use: Yes    Types: Cocaine, IV, Heroin, Marijuana    Comment: Used crack and cocaine 04/02/16    Home Medications Prior to Admission medications  Medication Sig Start Date End Date Taking? Authorizing Provider  allopurinol (ZYLOPRIM) 100 MG tablet Take 2 tablets (200 mg total) by mouth daily. Patient not taking: Reported on 03/03/2020 02/27/18   Doles-Johnson, Teah, NP  lisinopril (PRINIVIL,ZESTRIL) 40 MG tablet Take 1 tablet (40 mg total) by mouth daily. Patient not taking: Reported on 03/03/2020 01/16/18   Azzie Glatter, FNP    Allergies    Patient has no known allergies.  Review of Systems   Review of Systems  Constitutional: Positive for appetite change and fatigue. Negative for chills and fever.  HENT: Negative for ear pain and sore throat.   Eyes: Negative for pain and visual  disturbance.  Respiratory: Negative for cough and shortness of breath.   Cardiovascular: Negative for chest pain and palpitations.  Gastrointestinal: Positive for abdominal pain and nausea. Negative for blood in stool.  Genitourinary: Positive for difficulty urinating. Negative for dysuria.  Musculoskeletal: Positive for arthralgias, back pain, myalgias and neck pain.  Skin: Positive for pallor. Negative for rash.  Neurological: Positive for dizziness and light-headedness. Negative for syncope.  All other systems reviewed and are negative.   Physical Exam Updated Vital Signs BP 135/62 (BP Location: Right Arm)   Pulse 95   Temp (!) 100.7 F (38.2 C) (Oral)   Resp 19   Ht 5\' 2"  (1.575 m)   Wt 67.3 kg   SpO2 100%   BMI 27.14 kg/m   Physical Exam Vitals and nursing note reviewed.  Constitutional:      Comments: Pale  HENT:     Head: Normocephalic and atraumatic.  Eyes:     Conjunctiva/sclera: Conjunctivae normal.     Comments: Conjunctival pallor  Cardiovascular:     Rate and Rhythm: Normal rate and regular rhythm.     Heart sounds: No murmur heard.   Pulmonary:     Effort: Pulmonary effort is normal. No respiratory distress.     Breath sounds: Normal breath sounds.  Abdominal:     Palpations: Abdomen is soft.     Tenderness: There is abdominal tenderness in the epigastric area. There is no guarding or rebound.  Musculoskeletal:     Cervical back: Neck supple.  Skin:    General: Skin is warm and dry.     Coloration: Skin is pale.  Neurological:     General: No focal deficit present.     Mental Status: He is alert and oriented to person, place, and time.     Cranial Nerves: No cranial nerve deficit.     ED Results / Procedures / Treatments   Labs (all labs ordered are listed, but only abnormal results are displayed) Labs Reviewed  CBC - Abnormal; Notable for the following components:      Result Value   RBC 2.12 (*)    Hemoglobin 5.8 (*)    HCT 19.8 (*)     MCHC 29.3 (*)    RDW 18.0 (*)    Platelets 110 (*)    nRBC 2.2 (*)    All other components within normal limits  COMPREHENSIVE METABOLIC PANEL - Abnormal; Notable for the following components:   CO2 21 (*)    Glucose, Bld 158 (*)    Albumin 2.9 (*)    AST 14 (*)    Alkaline Phosphatase 148 (*)    Total Bilirubin 1.3 (*)    All other components within normal limits  IRON AND TIBC - Abnormal; Notable for the following components:   Iron 14 (*)  Saturation Ratios 5 (*)    All other components within normal limits  FERRITIN - Abnormal; Notable for the following components:   Ferritin 1,319 (*)    All other components within normal limits  TROPONIN I (HIGH SENSITIVITY) - Abnormal; Notable for the following components:   Troponin I (High Sensitivity) 22 (*)    All other components within normal limits  TROPONIN I (HIGH SENSITIVITY) - Abnormal; Notable for the following components:   Troponin I (High Sensitivity) 19 (*)    All other components within normal limits  RESPIRATORY PANEL BY RT PCR (FLU A&B, COVID)  URINALYSIS, ROUTINE W REFLEX MICROSCOPIC  RETICULOCYTES  PROTIME-INR  HEMOGLOBIN AND HEMATOCRIT, BLOOD  PSA  SAVE SMEAR (SSMR)  HIV ANTIBODY (ROUTINE TESTING W REFLEX)  BASIC METABOLIC PANEL  CBC  POC OCCULT BLOOD, ED  TYPE AND SCREEN  PREPARE RBC (CROSSMATCH)  ABO/RH    EKG EKG Interpretation  Date/Time:  Thursday March 03 2020 09:27:25 EST Ventricular Rate:  88 PR Interval:  126 QRS Duration: 84 QT Interval:  366 QTC Calculation: 442 R Axis:   35 Text Interpretation: Normal sinus rhythm Nonspecific ST abnormality Abnormal ECG No STEMI Confirmed by Octaviano Glow [phone] on 03/03/2020 10:35:04 AM   Radiology DG Chest 2 View  Result Date: 03/03/2020 CLINICAL DATA:  Chest pain, shortness of breath EXAM: CHEST - 2 VIEW COMPARISON:  04/25/2012 FINDINGS: The heart size and mediastinal contours are within normal limits. Mildly coarsened interstitial markings  bilaterally. No focal airspace consolidation, pleural effusion, or pneumothorax. Degenerative changes of the bilateral shoulders. IMPRESSION: Mildly coarsened interstitial markings bilaterally, which may reflect bronchitic type lung changes. No focal airspace consolidation. Electronically Signed   By: Davina Poke D.O.   On: 03/03/2020 10:04   CT Angio Chest/Abd/Pel for Dissection W and/or Wo Contrast  Result Date: 03/03/2020 CLINICAL DATA:  Abdominal pain with acute aortic dissection suspected EXAM: CT ANGIOGRAPHY CHEST, ABDOMEN AND PELVIS TECHNIQUE: Non-contrast CT of the chest was initially obtained. Multidetector CT imaging through the chest, abdomen and pelvis was performed using the standard protocol during bolus administration of intravenous contrast. Multiplanar reconstructed images and MIPs were obtained and reviewed to evaluate the vascular anatomy. CONTRAST:  152mL OMNIPAQUE IOHEXOL 350 MG/ML SOLN COMPARISON:  None. FINDINGS: CTA CHEST FINDINGS Cardiovascular: No intramural aortic hematoma on noncontrast phase. Normal heart size. No pericardial effusion. Mediastinum/Nodes: Negative for adenopathy or mass. Lungs/Pleura: Subpleural nodule in the anterior right chest which is related to a rib lesion. No pulmonary nodule or consolidation Musculoskeletal: Diffuse heterogeneous bony density patchy areas of sclerosis and lucency. Pathologic anterior right third rib fracture. 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 Review of the MIP images confirms the above findings. CTA ABDOMEN AND PELVIS FINDINGS VASCULAR Aorta: Atheromatous wall thickening.  No dissection or aneurysm Celiac: Patent without evidence of aneurysm, dissection, vasculitis or significant stenosis. SMA: Moderate atheromatous narrowing proximally. No acute finding including branch occlusion. Renals: Single bilateral renal arteries which are smooth and widely patent. IMA: Patent Inflow: Atheromatous plaque without flow  limiting stenosis or dissection. Veins: Negative in the arterial phase Review of the MIP images confirms the above findings. NON-VASCULAR Hepatobiliary: Lobulated liver surface compatible with cirrhosis. There is known hepatitis C.no evidence of biliary obstruction or stone. Pancreas: Coarse calcification at the head, likely post inflammatory. No acute finding. Spleen: Generous size in the setting of presumed portal hypertension. Adrenals/Urinary Tract: Negative adrenals. No hydronephrosis or stone. Unremarkable bladder. Stomach/Bowel:  No obstruction. Mild distal colonic diverticulosis.  Lymphatic: Lymphadenopathy in the pelvis and lower abdomen which is retroperitoneal. A rounded node left of the distal aorta measures 21 mm in short axis. A left external iliac node measures 2 cm in short axis on 6:161 Reproductive:Enlarged prostate with eccentric left nodularity at the bladder base. Other: No ascites or pneumoperitoneum. Musculoskeletal: Extensive sclerotic metastatic disease in the lumbar spine and pelvis with aggressive periosteal reaction along the bilateral iliac fossa. Multilevel lumbar spine degeneration with spinal stenosis at L3-4 and below. Tumor may infiltrate the right eccentric and ventral sacral spinal canal at the level of S1. No acute fracture. Review of the MIP images confirms the above findings. IMPRESSION: 1. Widespread osseous metastatic disease and lower retroperitoneal adenopathy. Favor prostate primary. 2. Pathologic right anterior third rib fracture. 3. 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 4. Cirrhosis with splenomegaly. 5. No acute aortic finding. Electronically Signed   By: Monte Fantasia M.D.   On: 03/03/2020 11:56    Procedures .Critical Care Performed by: Wyvonnia Dusky, MD Authorized by: Wyvonnia Dusky, MD   Critical care provider statement:    Critical care time (minutes):  45   Critical care was necessary to treat or prevent imminent or life-threatening  deterioration of the following conditions:  Circulatory failure   Critical care was time spent personally by me on the following activities:  Discussions with consultants, evaluation of patient's response to treatment, examination of patient, ordering and performing treatments and interventions, ordering and review of laboratory studies, ordering and review of radiographic studies, pulse oximetry, re-evaluation of patient's condition, obtaining history from patient or surrogate and review of old charts   (including critical care time)  Medications Ordered in ED Medications  0.9 %  sodium chloride infusion (has no administration in time range)  tamsulosin (FLOMAX) capsule 0.4 mg (0.4 mg Oral Given 03/03/20 1716)  acetaminophen (TYLENOL) tablet 650 mg (650 mg Oral Given 03/03/20 1741)    Or  acetaminophen (TYLENOL) suppository 650 mg ( Rectal See Alternative 03/03/20 1741)  HYDROmorphone (DILAUDID) injection 0.5-1 mg (1 mg Intravenous Given 03/03/20 1512)  ondansetron (ZOFRAN) tablet 4 mg (has no administration in time range)    Or  ondansetron (ZOFRAN) injection 4 mg (has no administration in time range)  LORazepam (ATIVAN) injection 0.5 mg (has no administration in time range)  ferrous gluconate (FERGON) tablet 324 mg (has no administration in time range)  famotidine (PEPCID) IVPB 20 mg premix (0 mg Intravenous Stopped 03/03/20 1318)  alum & mag hydroxide-simeth (MAALOX/MYLANTA) 200-200-20 MG/5ML suspension 30 mL (30 mLs Oral Given 03/03/20 1238)  iohexol (OMNIPAQUE) 350 MG/ML injection 100 mL (100 mLs Intravenous Contrast Given 03/03/20 1141)  morphine 4 MG/ML injection 4 mg (4 mg Intravenous Given 03/03/20 1258)  pantoprazole (PROTONIX) 80 mg in sodium chloride 0.9 % 100 mL IVPB (0 mg Intravenous Stopped 03/03/20 1509)    ED Course  I have reviewed the triage vital signs and the nursing notes.  Pertinent labs & imaging results that were available during my care of the patient were  reviewed by me and considered in my medical decision making (see chart for details).  This patient complains of chest pain, hip pain, shoulder pain, epigastric pain.  This involves an extensive number of treatment options, and is a complaint that carries with it a high risk of complications and morbidity.    Based on his presenting symptoms of new onset chest pain rating to his shoulder blades, I was most concerned to rule out an  aortic dissection.  Troponins were ordered as well as a stat CT dissection study.  This fortunately did not show dissection, but did show evidence of extensive bony metastasis, with likely primary source of the prostate.  The patient is describing difficulty with urination and this is consistent with his history.  Unfortunately I do think that he has metastatic prostate cancer at this time, and informed the patient about the scan results at the bedside.  His hemoglobin was noted to be low at 5.8.  Given his extensive NSAID use, I suspect he has an upper GI bleeding and likely gastric ulcer.  Have ordered IV Pepcid and IV Protonix, as well as IV morphine for his pain.  Patient was consented for blood transfusion and 2 units of blood were ordered.  He is stable at this time he can be admitted to the hospital team, with a nonemergent GI consult as an inpatient.  I personally reviewed his EKG and chest x-ray.  EKG shows a sinus rhythm with no acute changes.  I ordered, reviewed, and interpreted labs, which included hgb 5.8.  CMP unremarkable. I ordered medications as noted above for pain I ordered imaging studies which included CTA dissection study  I independently visualized and interpreted imaging which showed lytic bony lesions and no evident dissection and the monitor tracing which showed sinus rhythm   Clinical Course as of Mar 03 1802  Thu Mar 03, 2020  1113 Hemoglobin(!!): 5.8 [MT]  1113 Patient was verbally consented for blood transfusion.  I have ordered him 2  units of blood.  I suspect he likely has a GI bleed, possibly a peptic ulcer given his high-dose NSAID use.  I would still obtain a CT angio to rule out dissection, particular given his troponin elevation, but otherwise he will need admission to the hospital for GI evaluation   [MT]  1200 IMPRESSION: 1. Widespread osseous metastatic disease and lower retroperitoneal adenopathy. Favor prostate primary. 2. Pathologic right anterior third rib fracture. 3. 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 4. Cirrhosis with splenomegaly. 5. No acute aortic finding   [MT]  1224 Rectal exam with no gross blood or melena, POC test pending.  Still suspect likely upper GI bleed.  IV morphine and Iv protonix now ordered.  Pt HD stable.  He was updated regarding CT results including likelihood of cancer   [MT]  1225 Will admit to hospitalist   [MT]  [place] to Dr Roosevelt Locks hospitalist who will admit the patient.  Dr Michail Sermon from GI made aware and added to treatment team    [MT]    Clinical Course User Index [MT] Rees Matura, Carola Rhine, MD    Final Clinical Impression(s) / ED Diagnoses Final diagnoses:  Symptomatic anemia  Bone metastases ([place])  Gastritis, presence of bleeding unspecified, unspecified chronicity, unspecified gastritis type    Rx / DC Orders ED Discharge Orders    None       Wyvonnia Dusky, MD 03/03/20 [id number]

## 2020-03-03 NOTE — ED Notes (Signed)
Report given to Jamesetta So, RN on 64M

## 2020-03-03 NOTE — ED Notes (Signed)
C/o sharp crushing chest pain- 8/8

## 2020-03-03 NOTE — ED Notes (Addendum)
Oncology/Hematology RN at bedside.

## 2020-03-03 NOTE — ED Triage Notes (Addendum)
Patient arrives to ED with complaints of dull/tender sunsternal chest pain x3 weeks. States pain radiates to jaw and back. Pt states that he also has right flank pain x3 months and now he is having trouble urinating. Mild SOB noted. Hx ETOH abuse & Hep C.

## 2020-03-03 NOTE — H&P (Signed)
History and Physical    Edgar Mooney IZT:[id number] DOB: 05-24-1965 DOA: 03/03/2020  PCP: Patient, No Pcp Per (Confirm with patient/family/NH records and if not entered, this has to be entered at [facility] point of entry) Patient coming from: Home  I have personally briefly reviewed patient's old medical records in [place]  Chief Complaint: body aching and weak  HPI: Edgar Mooney is a 54 y.o. male with medical history significant of chronic Hep C s/p treatment, Cirrhosis, gout, non-compliant with medications, presented with worsening of pain on the neck, back and hip and worsening of generalized weakness. Symptoms started 3 months ago, only with neck pain, aching like then gradually developed shooting pain down to the right arm all the way to the 5 fingers and has been persistent. Then gradually, he developed low back pain and bilateral hip pain, asymptomatic, worse at night, for all of these above-mentioned pains he has been taking both from 800 mg 3-4 times a day for the last month. He also reported weight loss of more than 50 pounds compared to last year. For last month, he has been having trouble urinate, his stream is extremely weak, and he has to push to urinate. He denied any dysuria no fever chills. And he has been constipated as well. He denied any abdominal pain no nauseous vomit, he denied any black tarry stool or blood in the stool. ED Course: Hemoglobin 5.8, BUN 19, creatinine 0.9 CT abdomen pelvis showed possible right osseous metastatic disease and lower retroperitoneal adenopathy, suspicious for prostate CA metastatic.  Review of Systems: As per HPI otherwise 14 point review of systems negative.    Past Medical History:  Diagnosis Date  . Alcohol abuse   . Cocaine abuse ([place])   . Depression   . Gout   . Hep C w/o coma, chronic [facility]) diagnosed May 2016    History reviewed. No pertinent surgical history.   reports that he has been smoking cigarettes. He has a 25.00  pack-year smoking history. He has never used smokeless tobacco. He reports previous alcohol use of about 126.0 standard drinks of alcohol per week. He reports current drug use. Drugs: Cocaine, IV, Heroin, and Marijuana.  No Known Allergies  Family History  Problem Relation Age of Onset  . Cancer Mother   . Cancer Father      Prior to Admission medications   Medication Sig Start Date End Date Taking? Authorizing Provider  allopurinol (ZYLOPRIM) 100 MG tablet Take 2 tablets (200 mg total) by mouth daily. Patient not taking: Reported on 03/03/2020 02/27/18   Doles-Johnson, Teah, NP  lisinopril (PRINIVIL,ZESTRIL) 40 MG tablet Take 1 tablet (40 mg total) by mouth daily. Patient not taking: Reported on 03/03/2020 01/16/18   Azzie Glatter, FNP    Physical Exam: Vitals:   03/03/20 1230 03/03/20 1300 03/03/20 1302 03/03/20 1315  BP: (!) 116/91 117/89 117/89 123/69  Pulse: 86 88 82 85  Resp: 20 19 18 16   Temp:   98.1 F (36.7 C)   TempSrc:   Oral   SpO2: 100% 100% 100% 100%  Weight:      Height:        Constitutional: NAD, calm, comfortable Vitals:   03/03/20 1230 03/03/20 1300 03/03/20 1302 03/03/20 1315  BP: (!) 116/91 117/89 117/89 123/69  Pulse: 86 88 82 85  Resp: 20 19 18 16   Temp:   98.1 F (36.7 C)   TempSrc:   Oral   SpO2: 100% 100% 100% 100%  Weight:      Height:       Eyes: PERRL, looks pale ENMT: Mucous membranes are moist. Posterior pharynx clear of any exudate or lesions.Normal dentition.  Neck: normal, supple, no masses, no thyromegaly Respiratory: clear to auscultation bilaterally, no wheezing, no crackles. Normal respiratory effort. No accessory muscle use.  Cardiovascular: Regular rate and rhythm, no murmurs / rubs / gallops. No extremity edema. 2+ pedal pulses. No carotid bruits.  Abdomen: no tenderness, no masses palpated. No hepatosplenomegaly. Bowel sounds positive.  Musculoskeletal: no clubbing / cyanosis. No joint deformity upper and lower  extremities. Good ROM, no contractures. Normal muscle tone.  Skin: no rashes, lesions, ulcers. No induration Neurologic: CN 2-12 grossly intact. Sensation intact, DTR normal. Strength 5/5 in all 4.  Psychiatric: Normal judgment and insight. Alert and oriented x 3. Normal mood.     Labs on Admission: I have personally reviewed following labs and imaging studies  CBC: Recent Labs  Lab 03/03/20 0948  WBC 5.9  HGB 5.8*  HCT 19.8*  MCV 93.4  PLT 242*   Basic Metabolic Panel: Recent Labs  Lab 03/03/20 0948  NA 136  K 4.0  CL 103  CO2 21*  GLUCOSE 158*  BUN 19  CREATININE 0.98  CALCIUM 9.0   GFR: Estimated Creatinine Clearance: 73.1 mL/min (by C-G formula based on SCr of 0.98 mg/dL). Liver Function Tests: Recent Labs  Lab 03/03/20 0948  AST 14*  ALT 8  ALKPHOS 148*  BILITOT 1.3*  PROT 6.5  ALBUMIN 2.9*   No results for input(s): LIPASE, AMYLASE in the last 168 hours. No results for input(s): AMMONIA in the last 168 hours. Coagulation Profile: No results for input(s): INR, PROTIME in the last 168 hours. Cardiac Enzymes: No results for input(s): CKTOTAL, CKMB, CKMBINDEX, TROPONINI in the last 168 hours. BNP (last 3 results) No results for input(s): PROBNP in the last 8760 hours. HbA1C: No results for input(s): HGBA1C in the last 72 hours. CBG: No results for input(s): GLUCAP in the last 168 hours. Lipid Profile: No results for input(s): CHOL, HDL, LDLCALC, TRIG, CHOLHDL, LDLDIRECT in the last 72 hours. Thyroid Function Tests: No results for input(s): TSH, T4TOTAL, FREET4, T3FREE, THYROIDAB in the last 72 hours. Anemia Panel: No results for input(s): VITAMINB12, FOLATE, FERRITIN, TIBC, IRON, RETICCTPCT in the last 72 hours. Urine analysis:    Component Value Date/Time   COLORURINE YELLOW 03/03/2020 [place] 03/03/2020 1205   LABSPEC 1.027 03/03/2020 1205   PHURINE 6.0 03/03/2020 1205   GLUCOSEU NEGATIVE 03/03/2020 1205   HGBUR NEGATIVE  03/03/2020 1205   BILIRUBINUR NEGATIVE 03/03/2020 [place] 03/03/2020 1205   PROTEINUR NEGATIVE 03/03/2020 1205   NITRITE NEGATIVE 03/03/2020 [place] 03/03/2020 1205    Radiological Exams on Admission: DG Chest 2 View  Result Date: 03/03/2020 CLINICAL DATA:  Chest pain, shortness of breath EXAM: CHEST - 2 VIEW COMPARISON:  04/25/2012 FINDINGS: The heart size and mediastinal contours are within normal limits. Mildly coarsened interstitial markings bilaterally. No focal airspace consolidation, pleural effusion, or pneumothorax. Degenerative changes of the bilateral shoulders. IMPRESSION: Mildly coarsened interstitial markings bilaterally, which may reflect bronchitic type lung changes. No focal airspace consolidation. Electronically Signed   By: Davina Poke D.O.   On: 03/03/2020 10:04   CT Angio Chest/Abd/Pel for Dissection W and/or Wo Contrast  Result Date: 03/03/2020 CLINICAL DATA:  Abdominal pain with acute aortic dissection suspected EXAM: CT ANGIOGRAPHY CHEST, ABDOMEN AND PELVIS TECHNIQUE:  Non-contrast CT of the chest was initially obtained. Multidetector CT imaging through the chest, abdomen and pelvis was performed using the standard protocol during bolus administration of intravenous contrast. Multiplanar reconstructed images and MIPs were obtained and reviewed to evaluate the vascular anatomy. CONTRAST:  120mL OMNIPAQUE IOHEXOL 350 MG/ML SOLN COMPARISON:  None. FINDINGS: CTA CHEST FINDINGS Cardiovascular: No intramural aortic hematoma on noncontrast phase. Normal heart size. No pericardial effusion. Mediastinum/Nodes: Negative for adenopathy or mass. Lungs/Pleura: Subpleural nodule in the anterior right chest which is related to a rib lesion. No pulmonary nodule or consolidation Musculoskeletal: Diffuse heterogeneous bony density patchy areas of sclerosis and lucency. Pathologic anterior right third rib fracture. Apparent extraosseous tumor from the  anterior right second rib. Review of the MIP images confirms the above findings. CTA ABDOMEN AND PELVIS FINDINGS VASCULAR Aorta: Atheromatous wall thickening.  No dissection or aneurysm Celiac: Patent without evidence of aneurysm, dissection, vasculitis or significant stenosis. SMA: Moderate atheromatous narrowing proximally. No acute finding including branch occlusion. Renals: Single bilateral renal arteries which are smooth and widely patent. IMA: Patent Inflow: Atheromatous plaque without flow limiting stenosis or dissection. Veins: Negative in the arterial phase Review of the MIP images confirms the above findings. NON-VASCULAR Hepatobiliary: Lobulated liver surface compatible with cirrhosis. There is known hepatitis C.no evidence of biliary obstruction or stone. Pancreas: Coarse calcification at the head, likely post inflammatory. No acute finding. Spleen: Generous size in the setting of presumed portal hypertension. Adrenals/Urinary Tract: Negative adrenals. No hydronephrosis or stone. Unremarkable bladder. Stomach/Bowel:  No obstruction. Mild distal colonic diverticulosis. Lymphatic: Lymphadenopathy in the pelvis and lower abdomen which is retroperitoneal. A rounded node left of the distal aorta measures 21 mm in short axis. A left external iliac node measures 2 cm in short axis on 6:161 Reproductive:Enlarged prostate with eccentric left nodularity at the bladder base. Other: No ascites or pneumoperitoneum. Musculoskeletal: Extensive sclerotic metastatic disease in the lumbar spine and pelvis with aggressive periosteal reaction along the bilateral iliac fossa. Multilevel lumbar spine degeneration with spinal stenosis at L3-4 and below. Tumor may infiltrate the right eccentric and ventral sacral spinal canal at the level of S1. No acute fracture. Review of the MIP images confirms the above findings. IMPRESSION: 1. Widespread osseous metastatic disease and lower retroperitoneal adenopathy. Favor prostate  primary. 2. Pathologic right anterior third rib fracture. 3. 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 4. Cirrhosis with splenomegaly. 5. No acute aortic finding. Electronically Signed   By: Monte Fantasia M.D.   On: 03/03/2020 11:56    EKG: Independently reviewed. LVH  Assessment/Plan Active Problems:   Anemia  (please populate well all problems here in Problem List. (For example, if patient is on BP meds at home and you resume or decide to hold them, it is a problem that needs to be her. Same for CAD, COPD, HLD and so on)  Symptomatic anemia -Iron study showed the saturation 5% compatible with chronic GI loss, likely from a prolonged NSAID use. -GI was consulted, recommend PPI drip for now. -PRBC x2 -Check INR, although overall low suspicion for varices bleeding, given relatively normal level of albumin.  Hold off Sandostatin.  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 -Case was also discussed with oncology,  who is to come to see patient this afternoon. -Pain control with IV Dilaudid  Urinary retention, acute on chronic -Check bladder scan -MRI as above -Start Flomax  Chronic hep C status post antiviral treatment 2 years ago -No acute concern  HTN -Hold BP meds for now.  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

## 2020-03-03 NOTE — ED Notes (Signed)
Admitting dr at bedside. Pt does c/o hip pain, back pain, had midsternal chest pain.  Pain is relieved in chest with Morphine

## 2020-03-03 NOTE — Consult Note (Signed)
Referring Provider: Dr. Octaviano Glow Primary Care Physician:  Patient, No Pcp Per Primary Gastroenterologist:  Althia Forts  Reason for Consultation:  Anemia  HPI: Edgar Mooney is a 54 y.o. male with history of hepatitis C and alcohol use presenting for consultation of anemia.  Patient presented to the ED today due to chest pain.  He has been having chest pain intermittently for the last couple of months, but it acutely worsened today.  He also has shortness of breath and fatigue.  He has also had back, hip, and shoulder pain for the last 2 to 3 months.  Because of this, he has been taking 600 to 800 mg of ibuprofen 4 times daily for the past 2 to 3 months.  Denies any abdominal pain.  Further denies nausea, vomiting, hematemesis, dysphagia, melena, hematochezia, changes in stool, diarrhea, constipation.  Reports weight loss of 30 pounds in the past 3 months and has had a decreased appetite.  Denies any family history of colon cancer or gastrointestinal malignancy.  States his father had cirrhosis of the liver.  Patient with past history of alcohol and cocaine use.  He states he drank heavily for 20 years, often drinking 12-18 beers per day.  He denies any alcohol or illicit drug use with past 7 months.  Past Medical History:  Diagnosis Date  . Alcohol abuse   . Cocaine abuse ([place])   . Depression   . Gout   . Hep C w/o coma, chronic [facility]) diagnosed May 2016    History reviewed. No pertinent surgical history.  Prior to Admission medications   Medication Sig Start Date End Date Taking? Authorizing Provider  allopurinol (ZYLOPRIM) 100 MG tablet Take 2 tablets (200 mg total) by mouth daily. Patient not taking: Reported on 03/03/2020 02/27/18   Doles-Johnson, Teah, NP  lisinopril (PRINIVIL,ZESTRIL) 40 MG tablet Take 1 tablet (40 mg total) by mouth daily. Patient not taking: Reported on 03/03/2020 01/16/18   Azzie Glatter, FNP    Scheduled Meds: . tamsulosin  0.4 mg Oral QPC  supper   Continuous Infusions: . sodium chloride    . pantoprazole (PROTONIX) IV     PRN Meds:.acetaminophen **OR** acetaminophen, HYDROmorphone (DILAUDID) injection, ondansetron **OR** ondansetron (ZOFRAN) IV  Allergies as of 03/03/2020  . (No Known Allergies)    Family History  Problem Relation Age of Onset  . Cancer Mother   . Cancer Father     Social History   Socioeconomic History  . Marital status: Single    Spouse name: Not on file  . Number of children: Not on file  . Years of education: Not on file  . Highest education level: Not on file  Occupational History  . Not on file  Tobacco Use  . Smoking status: Current Every Day Smoker    Packs/day: 1.00    Years: 25.00    Pack years: 25.00    Types: Cigarettes  . Smokeless tobacco: Never Used  Substance and Sexual Activity  . Alcohol use: Not Currently    Alcohol/week: 126.0 standard drinks    Types: 126 Cans of beer per week    Comment: 18 pack per day  . Drug use: Yes    Types: Cocaine, IV, Heroin, Marijuana    Comment: Used crack and cocaine 04/02/16  . Sexual activity: Not Currently  Other Topics Concern  . Not on file  Social History Narrative  . Not on file   Social Determinants of Health   Financial Resource Strain:   .  Difficulty of Paying Living Expenses: Not on file  Food Insecurity:   . Worried About Charity fundraiser in the Last Year: Not on file  . Ran Out of Food in the Last Year: Not on file  Transportation Needs:   . Lack of Transportation (Medical): Not on file  . Lack of Transportation (Non-Medical): Not on file  Physical Activity:   . Days of Exercise per Week: Not on file  . Minutes of Exercise per Session: Not on file  Stress:   . Feeling of Stress : Not on file  Social Connections:   . Frequency of Communication with Friends and Family: Not on file  . Frequency of Social Gatherings with Friends and Family: Not on file  . Attends Religious Services: Not on file  . Active  Member of Clubs or Organizations: Not on file  . Attends Archivist Meetings: Not on file  . Marital Status: Not on file  Intimate Partner Violence:   . Fear of Current or Ex-Partner: Not on file  . Emotionally Abused: Not on file  . Physically Abused: Not on file  . Sexually Abused: Not on file    Review of Systems: Review of Systems  Constitutional: Positive for malaise/fatigue and weight loss. Negative for chills and fever.  HENT: Negative for sore throat.   Eyes: Negative for pain and redness.  Respiratory: Positive for shortness of breath. Negative for cough and stridor.   Cardiovascular: Positive for chest pain. Negative for palpitations.  Gastrointestinal: Negative for abdominal pain, blood in stool, constipation, diarrhea, heartburn, melena, nausea and vomiting.  Genitourinary: Negative for hematuria.       +difficulty urinating  Musculoskeletal: Positive for back pain and joint pain.  Skin: Negative for itching and rash.  Neurological: Negative for seizures and loss of consciousness.  Endo/Heme/Allergies: Negative for polydipsia. Does not bruise/bleed easily.  Psychiatric/Behavioral: Negative for substance abuse. The patient is not nervous/anxious.      Physical Exam: Vital signs: Vitals:   03/03/20 1302 03/03/20 1315  BP: 117/89 123/69  Pulse: 82 85  Resp: 18 16  Temp: 98.1 F (36.7 C)   SpO2: 100% 100%     Physical Exam Vitals reviewed.  Constitutional:      General: He is not in acute distress. HENT:     Head: Normocephalic and atraumatic.     Nose: Nose normal. No congestion.     Mouth/Throat:     Mouth: Mucous membranes are moist.     Pharynx: Oropharynx is clear.  Eyes:     General: No scleral icterus.    Extraocular Movements: Extraocular movements intact.     Comments: Conjunctival pallor  Cardiovascular:     Rate and Rhythm: Normal rate and regular rhythm.     Pulses: Normal pulses.     Heart sounds: Normal heart sounds.  Pulmonary:      Effort: Pulmonary effort is normal. No respiratory distress.     Breath sounds: Normal breath sounds.  Abdominal:     General: Bowel sounds are normal. There is no distension.     Palpations: Abdomen is soft. There is no mass.     Tenderness: There is no abdominal tenderness. There is no guarding or rebound.     Hernia: No hernia is present.  Musculoskeletal:        General: No swelling or tenderness.     Cervical back: Normal range of motion and neck supple.  Skin:    General: Skin is warm  and dry.     Coloration: Skin is pale.  Neurological:     General: No focal deficit present.     Mental Status: He is alert and oriented to person, place, and time.  Psychiatric:        Mood and Affect: Mood normal.        Behavior: Behavior normal. Behavior is cooperative.     GI:  Lab Results: Recent Labs    03/03/20 0948  WBC 5.9  HGB 5.8*  HCT 19.8*  PLT 110*   BMET Recent Labs    03/03/20 0948  NA 136  K 4.0  CL 103  CO2 21*  GLUCOSE 158*  BUN 19  CREATININE 0.98  CALCIUM 9.0   LFT Recent Labs    03/03/20 0948  PROT 6.5  ALBUMIN 2.9*  AST 14*  ALT 8  ALKPHOS 148*  BILITOT 1.3*   PT/INR No results for input(s): LABPROT, INR in the last 72 hours.   Studies/Results: DG Chest 2 View  Result Date: 03/03/2020 CLINICAL DATA:  Chest pain, shortness of breath EXAM: CHEST - 2 VIEW COMPARISON:  04/25/2012 FINDINGS: The heart size and mediastinal contours are within normal limits. Mildly coarsened interstitial markings bilaterally. No focal airspace consolidation, pleural effusion, or pneumothorax. Degenerative changes of the bilateral shoulders. IMPRESSION: Mildly coarsened interstitial markings bilaterally, which may reflect bronchitic type lung changes. No focal airspace consolidation. Electronically Signed   By: Davina Poke D.O.   On: 03/03/2020 10:04   CT Angio Chest/Abd/Pel for Dissection W and/or Wo Contrast  Result Date: 03/03/2020 CLINICAL DATA:   Abdominal pain with acute aortic dissection suspected EXAM: CT ANGIOGRAPHY CHEST, ABDOMEN AND PELVIS TECHNIQUE: Non-contrast CT of the chest was initially obtained. Multidetector CT imaging through the chest, abdomen and pelvis was performed using the standard protocol during bolus administration of intravenous contrast. Multiplanar reconstructed images and MIPs were obtained and reviewed to evaluate the vascular anatomy. CONTRAST:  124mL OMNIPAQUE IOHEXOL 350 MG/ML SOLN COMPARISON:  None. FINDINGS: CTA CHEST FINDINGS Cardiovascular: No intramural aortic hematoma on noncontrast phase. Normal heart size. No pericardial effusion. Mediastinum/Nodes: Negative for adenopathy or mass. Lungs/Pleura: Subpleural nodule in the anterior right chest which is related to a rib lesion. No pulmonary nodule or consolidation Musculoskeletal: Diffuse heterogeneous bony density patchy areas of sclerosis and lucency. Pathologic anterior right third rib fracture. 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 Review of the MIP images confirms the above findings. CTA ABDOMEN AND PELVIS FINDINGS VASCULAR Aorta: Atheromatous wall thickening.  No dissection or aneurysm Celiac: Patent without evidence of aneurysm, dissection, vasculitis or significant stenosis. SMA: Moderate atheromatous narrowing proximally. No acute finding including branch occlusion. Renals: Single bilateral renal arteries which are smooth and widely patent. IMA: Patent Inflow: Atheromatous plaque without flow limiting stenosis or dissection. Veins: Negative in the arterial phase Review of the MIP images confirms the above findings. NON-VASCULAR Hepatobiliary: Lobulated liver surface compatible with cirrhosis. There is known hepatitis C.no evidence of biliary obstruction or stone. Pancreas: Coarse calcification at the head, likely post inflammatory. No acute finding. Spleen: Generous size in the setting of presumed portal hypertension. Adrenals/Urinary  Tract: Negative adrenals. No hydronephrosis or stone. Unremarkable bladder. Stomach/Bowel:  No obstruction. Mild distal colonic diverticulosis. Lymphatic: Lymphadenopathy in the pelvis and lower abdomen which is retroperitoneal. A rounded node left of the distal aorta measures 21 mm in short axis. A left external iliac node measures 2 cm in short axis on 6:161 Reproductive:Enlarged prostate with eccentric left nodularity  at the bladder base. Other: No ascites or pneumoperitoneum. Musculoskeletal: Extensive sclerotic metastatic disease in the lumbar spine and pelvis with aggressive periosteal reaction along the bilateral iliac fossa. Multilevel lumbar spine degeneration with spinal stenosis at L3-4 and below. Tumor may infiltrate the right eccentric and ventral sacral spinal canal at the level of S1. No acute fracture. Review of the MIP images confirms the above findings. IMPRESSION: 1. Widespread osseous metastatic disease and lower retroperitoneal adenopathy. Favor prostate primary. 2. Pathologic right anterior third rib fracture. 3. 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 4. Cirrhosis with splenomegaly. 5. No acute aortic finding. Electronically Signed   By: Monte Fantasia M.D.   On: 03/03/2020 11:56    Impression: Severe anemia: Anemia of chronic disease/metastatic disease vs. GI blood loss -Hemoglobin 5.8 today, baseline 14.6 as of 12/2017 -Low iron (14) and saturation (5%) but elevated ferritin (1319), concerning for anemia of chronic disease vs. Acute blood loss anemia -Normal BUN to Cr ratio less suggestive of upper GI bleeding -Heme negative -Heavy NSAID use concerning for PUD -Patient with CT findings of cirrhosis, could have portal hypertensive gastropathy.  Presentation not compatible with variceal bleeding.  Cirrhosis per CT imaging -T bili 1.3/AST 14/ALT 8/ALP 148 -INR pending -Thrombocytopenia: Platelets 110K/uL  Widespread osseous metastatic disease and lower retroperitoneal  adenopathy per CT 03/03/20.  Plan: Anemia may not be related to GI blood loss, though patient does have severe anemia and history of heavy NSAID use.  Thus we will proceed with EGD tomorrow.  I thoroughly discussed the procedure with the patient to include nature, alternatives, benefits, risks (including but not limited to bleeding, infection, perforation, anesthesia/cardiac and pulmonary complications).  Patient verbalized understanding and gave verbal consent to proceed with EGD.  Clear liquid diet okay from GI standpoint with n.p.o. after midnight.  Continue Protonix for now.  [facility] will follow.   LOS: 0 days   Salley Slaughter  PA-C 03/03/2020, 1:54 PM  Contact #  [phone]

## 2020-03-03 NOTE — ED Notes (Signed)
Dr. Benay Spice at bedside

## 2020-03-03 NOTE — Progress Notes (Addendum)
Blood Product IdentificationTag was not present upon previous completion of 1 unit of blood administration from previous RN Rolene Arbour

## 2020-03-03 NOTE — Consult Note (Addendum)
[place]  [phone] [phone]   MEDICAL ONCOLOGY - INITIAL CONSULTATION  Referral MD: Dr. Wynetta Fines  Reason for Referral: Anemia and lytic bone lesions  HPI: Edgar Mooney is a 54 year old male with a past medical history significant for chronic hepatitis C status post treatment, cirrhosis, history of polysubstance abuse, depression, gout, noncompliance with medications.  The patient presented to the emergency room with generalized arthralgias particularly in his chest, hips, legs.  He reports that his symptoms started about 3 months ago initially with neck pain and then gradually started to shoot down his right arm to his fingers.  He thought he had a pinched nerve.  He gradually developed worsening pain including back and hip pain.  Labs performed on admission showed a WBC of 5.9, hemoglobin 5.8, MCV 93.4, platelets 110,000, albumin 2.9, alk phos 148, T bili 1.3, ferritin 1319, iron 14, percent duration 5%.  Stool for occult blood negative.  Additional lab work has been ordered and is still pending including reticulocytes, INR, PSA, and peripheral blood smear.  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  When initially seen today, the patient is moaning out in pain.  He reports that his pain is worse in his chest.  Pain is located in the center of the chest and not radiating.  He also reports persistent back and hip pain.  He denies difficulty controlling his legs and incontinence.  No difficulty controlling his bowels.  Reports right arm pain.  He reports that he has been feeling poorly for the past few months. Reports shortness of breath particularly with exertion.  He reports a poor appetite and has lost about 30 pounds.  He currently denies headaches, dizziness, abdominal  pain, nausea, vomiting.  He has not noticed any bleeding such as epistaxis, hemoptysis, hematemesis, hematuria, melena, hematochezia.  The patient is currently living with family members in [place].  He reports that he has 2 children.  He currently smokes 1 pack of cigarettes per day.  Denies current alcohol use.  Reports history of IV cocaine and crack cocaine use-last use about 1 year ago.  Medical oncology was asked see the patient make recommendations regarding his anemia and lytic bone lesions.    Past Medical History:  Diagnosis Date  . Alcohol abuse   . Cocaine abuse ([place])   . Depression   . Gout   . Hep C w/o coma, chronic [facility]) diagnosed May 2016  :  History reviewed. No pertinent surgical history.:  Current Facility-Administered Medications  Medication Dose Route Frequency Provider Last Rate Last Admin  . 0.9 %  sodium chloride infusion  10 mL/hr Intravenous Once Wyvonnia Dusky, MD      . acetaminophen (TYLENOL) tablet 650 mg  650 mg Oral Q6H PRN [name], MD       Or  . acetaminophen (TYLENOL) suppository 650 mg  650 mg Rectal Q6H PRN Wynetta Fines T, MD      . ferrous gluconate [facility]) tablet 324 mg  324 mg Oral BID WC Wynetta Fines T, MD      . HYDROmorphone (DILAUDID) injection 0.5-1 mg  0.5-1 mg Intravenous Q2H PRN Wynetta Fines T, MD   1 mg at 03/03/20 1512  . LORazepam (ATIVAN) injection 0.5 mg  0.5 mg Intravenous Once [name], MD      . ondansetron [facility])  tablet 4 mg  4 mg Oral Q6H PRN Wynetta Fines T, MD       Or  . ondansetron [facility]) injection 4 mg  4 mg Intravenous Q6H PRN Wynetta Fines T, MD      . tamsulosin (FLOMAX) capsule 0.4 mg  0.4 mg Oral QPC supper [name], MD       Current Outpatient Medications  Medication Sig Dispense Refill  . allopurinol (ZYLOPRIM) 100 MG tablet Take 2 tablets (200 mg total) by mouth daily. (Patient not taking: Reported on 03/03/2020) 60 tablet 2  . lisinopril (PRINIVIL,ZESTRIL) 40 MG tablet Take 1 tablet (40 mg  total) by mouth daily. (Patient not taking: Reported on 03/03/2020) 30 tablet 2     No Known Allergies:  Family History  Problem Relation Age of Onset  . Cancer Mother   . Cancer Father   :  Social History   Socioeconomic History  . Marital status: Single    Spouse name: Not on file  . Number of children: Not on file  . Years of education: Not on file  . Highest education level: Not on file  Occupational History  . Not on file  Tobacco Use  . Smoking status: Current Every Day Smoker    Packs/day: 1.00    Years: 25.00    Pack years: 25.00    Types: Cigarettes  . Smokeless tobacco: Never Used  Substance and Sexual Activity  . Alcohol use: Not Currently    Alcohol/week: 126.0 standard drinks    Types: 126 Cans of beer per week    Comment: 18 pack per day  . Drug use: Yes    Types: Cocaine, IV, Heroin, Marijuana    Comment: Used crack and cocaine 04/02/16  . Sexual activity: Not Currently  Other Topics Concern  . Not on file  Social History Narrative  . Not on file   Social Determinants of Health   Financial Resource Strain:   . Difficulty of Paying Living Expenses: Not on file  Food Insecurity:   . Worried About Charity fundraiser in the Last Year: Not on file  . Ran Out of Food in the Last Year: Not on file  Transportation Needs:   . Lack of Transportation (Medical): Not on file  . Lack of Transportation (Non-Medical): Not on file  Physical Activity:   . Days of Exercise per Week: Not on file  . Minutes of Exercise per Session: Not on file  Stress:   . Feeling of Stress : Not on file  Social Connections:   . Frequency of Communication with Friends and Family: Not on file  . Frequency of Social Gatherings with Friends and Family: Not on file  . Attends Religious Services: Not on file  . Active Member of Clubs or Organizations: Not on file  . Attends Archivist Meetings: Not on file  . Marital Status: Not on file  Intimate Partner Violence:   .  Fear of Current or Ex-Partner: Not on file  . Emotionally Abused: Not on file  . Physically Abused: Not on file  . Sexually Abused: Not on file  :  Review of Systems: A comprehensive 14 point review of systems was negative except as noted in the HPI.  Exam: Patient Vitals for the past 24 hrs:  BP Temp Temp src Pulse Resp SpO2 Height Weight  03/03/20 1500 (!) 123/97 -- -- 88 18 100 % -- --  03/03/20 1445 127/70 -- -- 88 (!) 24 100 % -- --  03/03/20 1430 133/70 -- -- 83 (!) 24 100 % -- --  03/03/20 1415 132/70 -- -- 90 (!) 22 100 % -- --  03/03/20 1400 (!) 116/47 -- -- 85 20 100 % -- --  03/03/20 1315 123/69 -- -- 85 16 100 % -- --  03/03/20 1302 117/89 98.1 F (36.7 C) Oral 82 18 100 % -- --  03/03/20 1300 117/89 -- -- 88 19 100 % -- --  03/03/20 1230 (!) 116/91 -- -- 86 20 100 % -- --  03/03/20 1215 (!) 105/50 -- -- 87 19 100 % -- --  03/03/20 1100 (!) 144/70 -- -- 82 18 100 % -- --  03/03/20 0936 -- -- -- -- -- -- 5\' 2"  (1.575 m) 68 kg  03/03/20 0935 136/66 -- -- 88 15 100 % -- --  03/03/20 0932 -- 97.9 F (36.6 C) Oral -- -- -- -- --    General: Chronically ill-appearing male, appears to be in distress secondary to pain Eyes:  no scleral icterus.   ENT:  There were no oropharyngeal lesions.     Lymphatics:  Negative cervical, supraclavicular or axillary adenopathy.   Respiratory: lungs were clear bilaterally without wheezing or crackles.   Cardiovascular:  Regular rate and rhythm, S1/S2, without murmur, rub or gallop.  There was no pedal edema.   GI:  abdomen was soft, flat, nontender, nondistended, without organomegaly.   Musculoskeletal: Strength symmetrical in the upper and lower extremities bilaterally.   Skin exam was without echymosis, petichae.   Neuro exam was nonfocal. Patient was alert and oriented.  Attention was good.   Language was appropriate.  Mood was normal without depression.  Speech was not pressured.  Thought content was not tangential.     Lab Results   Component Value Date   WBC 5.9 03/03/2020   HGB 5.8 (LL) 03/03/2020   HCT 19.8 (L) 03/03/2020   PLT 110 (L) 03/03/2020   GLUCOSE 158 (H) 03/03/2020   CHOL 196 12/19/2017   TRIG 409 (H) 12/19/2017   HDL 29 (L) 12/19/2017   LDLDIRECT 94 02/27/2018   LDLCALC Comment 12/19/2017   ALT 8 03/03/2020   AST 14 (L) 03/03/2020   NA 136 03/03/2020   K 4.0 03/03/2020   CL 103 03/03/2020   CREATININE 0.98 03/03/2020   BUN 19 03/03/2020   CO2 21 (L) 03/03/2020  Blood smear: Ovalocytes, teardrops, rare nucleated red cell.  There are promyelocytes, myelocytes, and band forms.  The platelets appear normal in number.  DG Chest 2 View  Result Date: 03/03/2020 CLINICAL DATA:  Chest pain, shortness of breath EXAM: CHEST - 2 VIEW COMPARISON:  04/25/2012 FINDINGS: The heart size and mediastinal contours are within normal limits. Mildly coarsened interstitial markings bilaterally. No focal airspace consolidation, pleural effusion, or pneumothorax. Degenerative changes of the bilateral shoulders. IMPRESSION: Mildly coarsened interstitial markings bilaterally, which may reflect bronchitic type lung changes. No focal airspace consolidation. Electronically Signed   By: 06/23/2012 D.O.   On: 03/03/2020 10:04   CT Angio Chest/Abd/Pel for Dissection W and/or Wo Contrast  Result Date: 03/03/2020 CLINICAL DATA:  Abdominal pain with acute aortic dissection suspected EXAM: CT ANGIOGRAPHY CHEST, ABDOMEN AND PELVIS TECHNIQUE: Non-contrast CT of the chest was initially obtained. Multidetector CT imaging through the chest, abdomen and pelvis was performed using the standard protocol during bolus administration of intravenous contrast. Multiplanar reconstructed images and MIPs were obtained and reviewed to evaluate the vascular anatomy. CONTRAST:  03/05/2020 OMNIPAQUE IOHEXOL 350 MG/ML  SOLN COMPARISON:  None. FINDINGS: CTA CHEST FINDINGS Cardiovascular: No intramural aortic hematoma on noncontrast phase. Normal heart size.  No pericardial effusion. Mediastinum/Nodes: Negative for adenopathy or mass. Lungs/Pleura: Subpleural nodule in the anterior right chest which is related to a rib lesion. No pulmonary nodule or consolidation Musculoskeletal: Diffuse heterogeneous bony density patchy areas of sclerosis and lucency. Pathologic anterior right third rib fracture. 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 Review of the MIP images confirms the above findings. CTA ABDOMEN AND PELVIS FINDINGS VASCULAR Aorta: Atheromatous wall thickening.  No dissection or aneurysm Celiac: Patent without evidence of aneurysm, dissection, vasculitis or significant stenosis. SMA: Moderate atheromatous narrowing proximally. No acute finding including branch occlusion. Renals: Single bilateral renal arteries which are smooth and widely patent. IMA: Patent Inflow: Atheromatous plaque without flow limiting stenosis or dissection. Veins: Negative in the arterial phase Review of the MIP images confirms the above findings. NON-VASCULAR Hepatobiliary: Lobulated liver surface compatible with cirrhosis. There is known hepatitis C.no evidence of biliary obstruction or stone. Pancreas: Coarse calcification at the head, likely post inflammatory. No acute finding. Spleen: Generous size in the setting of presumed portal hypertension. Adrenals/Urinary Tract: Negative adrenals. No hydronephrosis or stone. Unremarkable bladder. Stomach/Bowel:  No obstruction. Mild distal colonic diverticulosis. Lymphatic: Lymphadenopathy in the pelvis and lower abdomen which is retroperitoneal. A rounded node left of the distal aorta measures 21 mm in short axis. A left external iliac node measures 2 cm in short axis on 6:161 Reproductive:Enlarged prostate with eccentric left nodularity at the bladder base. Other: No ascites or pneumoperitoneum. Musculoskeletal: Extensive sclerotic metastatic disease in the lumbar spine and pelvis with aggressive periosteal reaction along  the bilateral iliac fossa. Multilevel lumbar spine degeneration with spinal stenosis at L3-4 and below. Tumor may infiltrate the right eccentric and ventral sacral spinal canal at the level of S1. No acute fracture. Review of the MIP images confirms the above findings. IMPRESSION: 1. Widespread osseous metastatic disease and lower retroperitoneal adenopathy. Favor prostate primary. 2. Pathologic right anterior third rib fracture. 3. 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 4. Cirrhosis with splenomegaly. 5. No acute aortic finding. Electronically Signed   By: Monte Fantasia M.D.   On: 03/03/2020 11:56     DG Chest 2 View  Result Date: 03/03/2020 CLINICAL DATA:  Chest pain, shortness of breath EXAM: CHEST - 2 VIEW COMPARISON:  04/25/2012 FINDINGS: The heart size and mediastinal contours are within normal limits. Mildly coarsened interstitial markings bilaterally. No focal airspace consolidation, pleural effusion, or pneumothorax. Degenerative changes of the bilateral shoulders. IMPRESSION: Mildly coarsened interstitial markings bilaterally, which may reflect bronchitic type lung changes. No focal airspace consolidation. Electronically Signed   By: Davina Poke D.O.   On: 03/03/2020 10:04   CT Angio Chest/Abd/Pel for Dissection W and/or Wo Contrast  Result Date: 03/03/2020 CLINICAL DATA:  Abdominal pain with acute aortic dissection suspected EXAM: CT ANGIOGRAPHY CHEST, ABDOMEN AND PELVIS TECHNIQUE: Non-contrast CT of the chest was initially obtained. Multidetector CT imaging through the chest, abdomen and pelvis was performed using the standard protocol during bolus administration of intravenous contrast. Multiplanar reconstructed images and MIPs were obtained and reviewed to evaluate the vascular anatomy. CONTRAST:  129m OMNIPAQUE IOHEXOL 350 MG/ML SOLN COMPARISON:  None. FINDINGS: CTA CHEST FINDINGS Cardiovascular: No intramural aortic hematoma on noncontrast phase. Normal heart size. No  pericardial effusion. Mediastinum/Nodes: Negative for adenopathy or mass. Lungs/Pleura: Subpleural nodule in the anterior right chest which is related to a rib lesion. No  pulmonary nodule or consolidation Musculoskeletal: Diffuse heterogeneous bony density patchy areas of sclerosis and lucency. Pathologic anterior right third rib fracture. 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 Review of the MIP images confirms the above findings. CTA ABDOMEN AND PELVIS FINDINGS VASCULAR Aorta: Atheromatous wall thickening.  No dissection or aneurysm Celiac: Patent without evidence of aneurysm, dissection, vasculitis or significant stenosis. SMA: Moderate atheromatous narrowing proximally. No acute finding including branch occlusion. Renals: Single bilateral renal arteries which are smooth and widely patent. IMA: Patent Inflow: Atheromatous plaque without flow limiting stenosis or dissection. Veins: Negative in the arterial phase Review of the MIP images confirms the above findings. NON-VASCULAR Hepatobiliary: Lobulated liver surface compatible with cirrhosis. There is known hepatitis C.no evidence of biliary obstruction or stone. Pancreas: Coarse calcification at the head, likely post inflammatory. No acute finding. Spleen: Generous size in the setting of presumed portal hypertension. Adrenals/Urinary Tract: Negative adrenals. No hydronephrosis or stone. Unremarkable bladder. Stomach/Bowel:  No obstruction. Mild distal colonic diverticulosis. Lymphatic: Lymphadenopathy in the pelvis and lower abdomen which is retroperitoneal. A rounded node left of the distal aorta measures 21 mm in short axis. A left external iliac node measures 2 cm in short axis on 6:161 Reproductive:Enlarged prostate with eccentric left nodularity at the bladder base. Other: No ascites or pneumoperitoneum. Musculoskeletal: Extensive sclerotic metastatic disease in the lumbar spine and pelvis with aggressive periosteal reaction along the  bilateral iliac fossa. Multilevel lumbar spine degeneration with spinal stenosis at L3-4 and below. Tumor may infiltrate the right eccentric and ventral sacral spinal canal at the level of S1. No acute fracture. Review of the MIP images confirms the above findings. IMPRESSION: 1. Widespread osseous metastatic disease and lower retroperitoneal adenopathy. Favor prostate primary. 2. Pathologic right anterior third rib fracture. 3. 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 4. Cirrhosis with splenomegaly. 5. No acute aortic finding. Electronically Signed   By: Monte Fantasia M.D.   On: 03/03/2020 11:56   Assessment and Plan:  1.  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  Severe anemia-likely secondary to metastatic prostate cancer involving the bones 3.  Mild thrombocytopenia 4.  Cirrhosis with splenomegaly 5.  History of hepatitis C 6.  History of polysubstance abuse 7.  Depression 8.  Tobacco dependence 9.  Right arm weakness, right facial numbness-potentially related to nerve root compromise from metastatic bone lesions 10.  Pain secondary to #1  Edgar Mooney presented to the emergency room with bone pain and shortness of breath.  Found to have significant anemia and lytic bone lesions.  Lytic bone lesions concerning for metastatic prostate cancer.  He is having significant pain in his bones.  No evidence of cord compression noted on CT scan.  Labs and imaging findings have been reviewed with the patient.  Recommend additional work-up for malignancy including obtaining a PSA.  May also need to consider obtaining a biopsy pending PSA results.  GI has been consulted for consideration of EGD due to anemia as well as his history of cirrhosis.  This is  planned for tomorrow.  Recommendations: 1.  Obtain PSA. 2.  We will obtain a peripheral blood smear for review. 3.  Transfuse PRBCs to keep hemoglobin above 7. 4.  EGD per GI. 5.  Proceed with MRI head and neck 6.  Narcotic analgesics  for pain  Further treatment recommendations pending above work-up.  Thank you for this referral.   Mikey Bussing, DNP, AGPCNP-BC, AOCNP  Mr. Richman was interviewed and examined.  I reviewed the CT images and the peripheral blood smear.  The clinical presentation is most consistent with a diagnosis of metastatic prostate cancer.  The PSA level is pending.  The anemia is likely related to prostate cancer involving the bones/bone marrow as there is a leukoerythroblastic pattern on the peripheral blood smear.  He reports pain, right arm weakness, and right facial numbness for the past 2-3 months.  The neurologic symptoms may be related to nerve impingement by bone metastases.  We will initiate systemic therapy for prostate cancer if the PSA returns elevated.  Outpatient follow-up will be scheduled at the Cancer center.

## 2020-03-03 NOTE — Progress Notes (Addendum)
NEW ADMISSION NOTE  Arrival Method:  Mental Orientation: Alert and oriented x4  elemetry: yes Assessment: Completed Skin: see notes AY:GEFU AC, right AC Pain: 9 Tubes: 1 Safety Measures: Safety Fall Prevention Plan has been given, discussed and signed Admission: Completed [facility] [place] Orientation: Patient has been orientated to the room, unit and staff.  Family: 0  Orders have been reviewed and implemented. Will continue to monitor the patient. Call light has been placed within reach and bed alarm has been activated.  Patient is a poor historian and unable to answer admitting questions  Patient refused to answer all admitting questions documentation  Beatris Ship, RN

## 2020-03-04 ENCOUNTER — Encounter (HOSPITAL_COMMUNITY)
Admission: EM | Disposition: A | Discharge status: Home / Self Care | Payer: Self-pay | Source: Home / Self Care | Attending: Internal Medicine

## 2020-03-04 ENCOUNTER — Inpatient Hospital Stay (HOSPITAL_COMMUNITY): Payer: BC Managed Care – PPO

## 2020-03-04 ENCOUNTER — Telehealth: Payer: Self-pay | Admitting: Oncology

## 2020-03-04 DIAGNOSIS — C61 Malignant neoplasm of prostate: Secondary | ICD-10-CM | POA: Diagnosis not present

## 2020-03-04 DIAGNOSIS — C7951 Secondary malignant neoplasm of bone: Secondary | ICD-10-CM | POA: Diagnosis not present

## 2020-03-04 DIAGNOSIS — D649 Anemia, unspecified: Secondary | ICD-10-CM | POA: Diagnosis not present

## 2020-03-04 LAB — CBC
HCT: 26.3 % — ABNORMAL LOW (ref 39.0–52.0)
Hemoglobin: 8.4 g/dL — ABNORMAL LOW (ref 13.0–17.0)
MCH: 27.8 pg (ref 26.0–34.0)
MCHC: 31.9 g/dL (ref 30.0–36.0)
MCV: 87.1 fL (ref 80.0–100.0)
Platelets: 101 10*3/uL — ABNORMAL LOW (ref 150–400)
RBC: 3.02 MIL/uL — ABNORMAL LOW (ref 4.22–5.81)
RDW: 17.6 % — ABNORMAL HIGH (ref 11.5–15.5)
WBC: 6 10*3/uL (ref 4.0–10.5)
nRBC: 2.8 % — ABNORMAL HIGH (ref 0.0–0.2)

## 2020-03-04 LAB — BASIC METABOLIC PANEL
Anion gap: 9 (ref 5–15)
BUN: 13 mg/dL (ref 6–20)
CO2: 21 mmol/L — ABNORMAL LOW (ref 22–32)
Calcium: 9 mg/dL (ref 8.9–10.3)
Chloride: 105 mmol/L (ref 98–111)
Creatinine, Ser: 0.97 mg/dL (ref 0.61–1.24)
GFR, Estimated: >60 mL/min (ref >60)
Glucose, Bld: 117 mg/dL — ABNORMAL HIGH (ref 70–99)
Potassium: 4.4 mmol/L (ref 3.5–5.1)
Sodium: 135 mmol/L (ref 135–145)

## 2020-03-04 LAB — BPAM RBC
Blood Product Expiration Date: 202112122359
Blood Product Expiration Date: 202112122359
ISSUE DATE / TIME: 202111181257
ISSUE DATE / TIME: 202111181708
Unit Type and Rh: 6200
Unit Type and Rh: 6200

## 2020-03-04 LAB — TYPE AND SCREEN
ABO/RH(D): A POS
Antibody Screen: NEGATIVE
Unit division: 0
Unit division: 0

## 2020-03-04 LAB — HEMOGLOBIN AND HEMATOCRIT, BLOOD
HCT: 24.1 % — ABNORMAL LOW (ref 39.0–52.0)
Hemoglobin: 7.8 g/dL — ABNORMAL LOW (ref 13.0–17.0)

## 2020-03-04 LAB — RETICULOCYTES
Immature Retic Fract: 37.7 % — ABNORMAL HIGH (ref 2.3–15.9)
RBC.: 2.64 MIL/uL — ABNORMAL LOW (ref 4.22–5.81)
Retic Count, Absolute: 123 10*3/uL (ref 19.0–186.0)
Retic Ct Pct: 4.7 % — ABNORMAL HIGH (ref 0.4–3.1)

## 2020-03-04 LAB — PSA: Prostatic Specific Antigen: 763 ng/mL — ABNORMAL HIGH (ref 0.00–4.00)

## 2020-03-04 LAB — PROTIME-INR
INR: 1.4 — ABNORMAL HIGH (ref 0.8–1.2)
Prothrombin Time: 16.5 seconds — ABNORMAL HIGH (ref 11.4–15.2)

## 2020-03-04 LAB — HIV ANTIBODY (ROUTINE TESTING W REFLEX): HIV Screen 4th Generation wRfx: NONREACTIVE

## 2020-03-04 IMAGING — MR MR THORACIC SPINE WO/W CM
8 of 12 series · 22 of 48 positions shown · IV contrast (Gadavist)
Comparison: Chest CTA from yesterday

CLINICAL DATA: Cancer bone and a primary.  Neck pain.

EXAM:
MRI THORACIC WITHOUT AND WITH CONTRAST
TECHNIQUE: Multiplanar and multiecho pulse sequences of the thoracic spine were
obtained without and with intravenous contrast.
CONTRAST:  7mL GADAVIST GADOBUTROL 1 MMOL/ML IV SOLN

[Series 16: T1 · sagittal · 3.0mm · 0.62mm/px · 1 of 9 slices shown (1 of 6)]
[im 1/9]
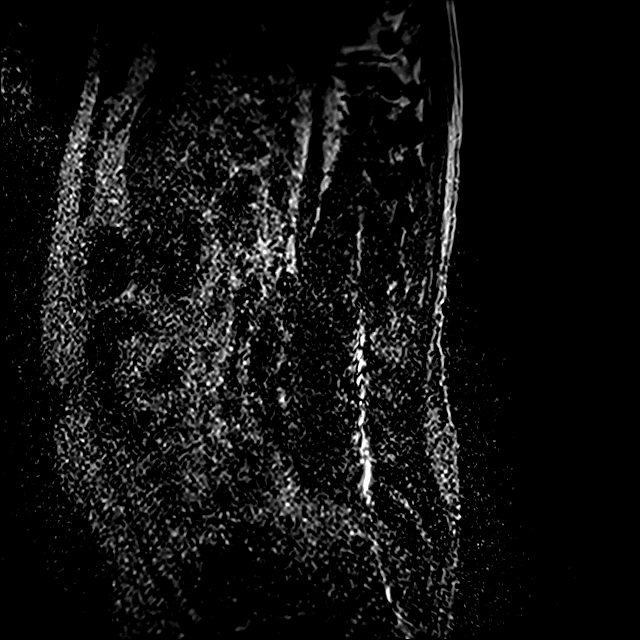

[Series 17: T1 · sagittal · 3.0mm · 0.62mm/px · 1 of 9 slices shown (2 of 6)]
[im 1/9]
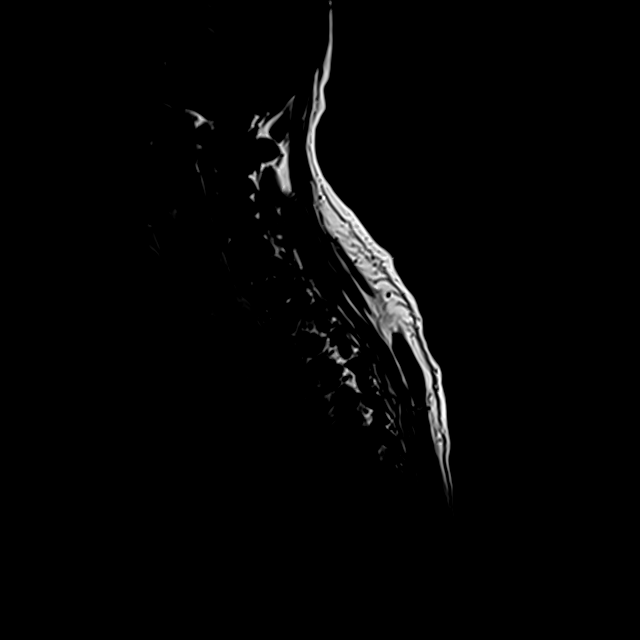

[Series 18: T1 · sagittal · 3.3mm · 0.62mm/px · 1 of 6 slices shown (3 of 6)]
[im 1/6]
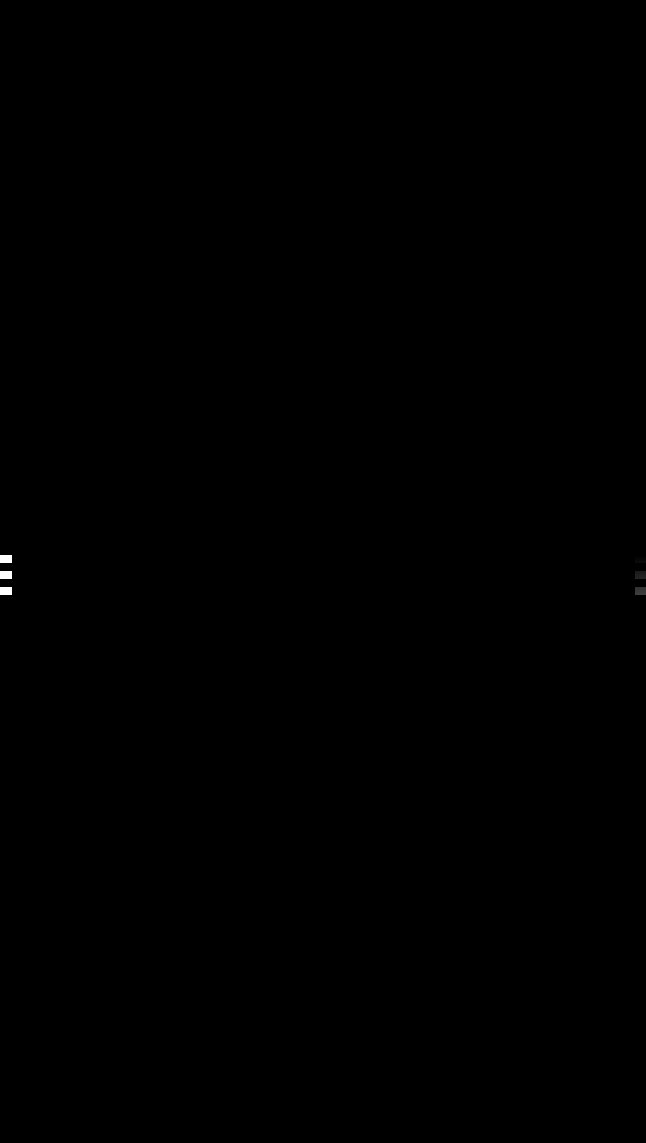

[Series 19: T2 · sagittal · 3.0mm · 0.76mm/px · 3 of 21 slices shown (1 of 2)]
[im 1/21]
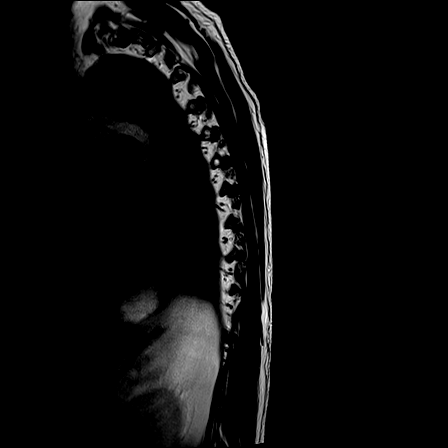
[im 11/21]
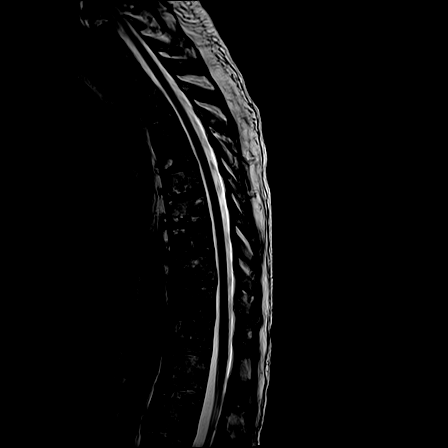
[im 21/21]
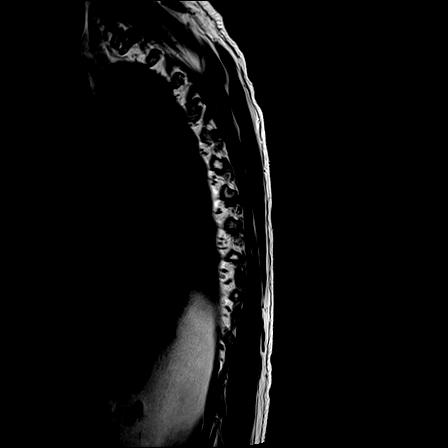

[Series 20: T1 · sagittal · 3.0mm · 0.76mm/px · 3 of 17 slices shown (4 of 6)]
[im 1/17]
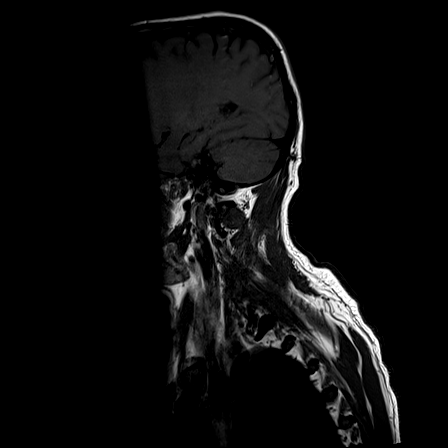
[im 9/17]
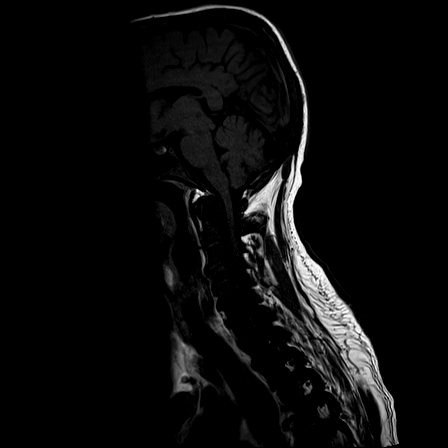
[im 17/17]
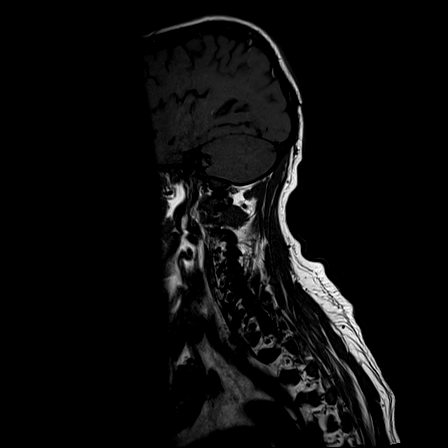

[Series 21: T1 · sagittal · 3.0mm · 0.76mm/px · 3 of 17 slices shown (5 of 6)]
[im 1/17]
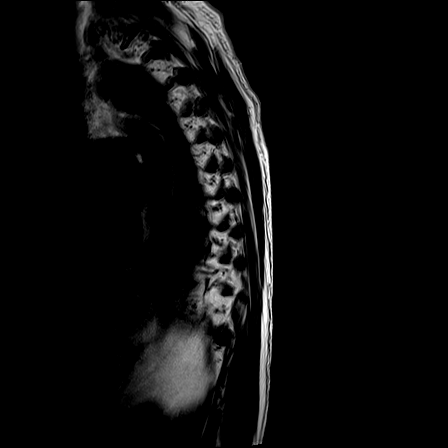
[im 9/17]
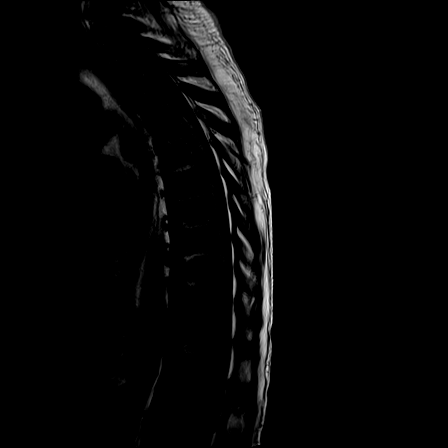
[im 17/17]
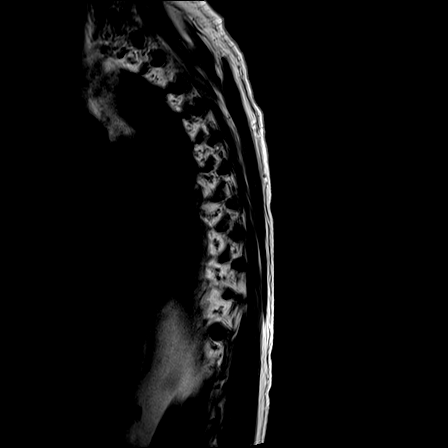

[Series 23: T2 · axial · 5.0mm · 0.59mm/px · z∈[-338,-125]mm · 7 of 39 slices shown (2 of 2)]
[im 1/39]
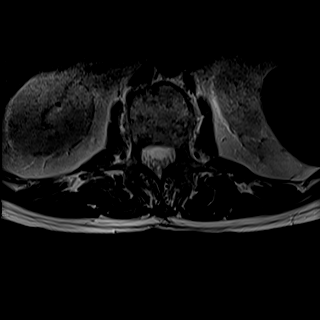
[im 7/39]
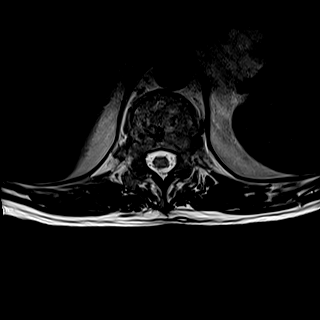
[im 13/39]
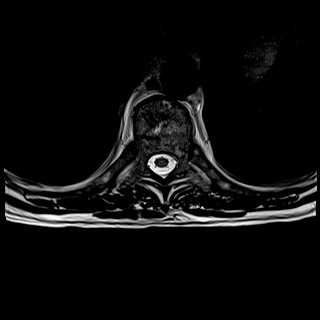
[im 20/39]
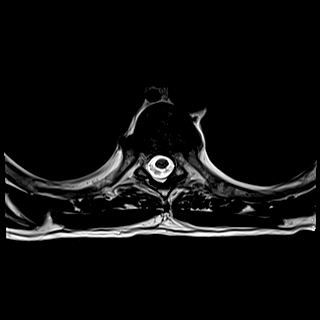
[im 26/39]
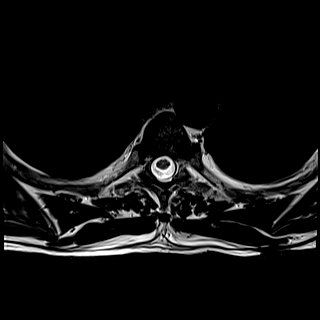
[im 32/39]
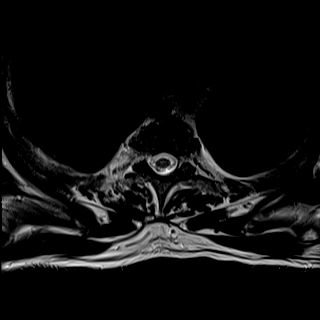
[im 39/39]
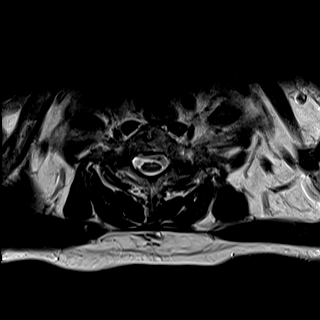

[Series 25: T1 · axial · non-contrast · 5.0mm · 0.31mm/px · z∈[-338,-252]mm · 3 of 39 slices shown (6 of 6)]
[im 1/39]
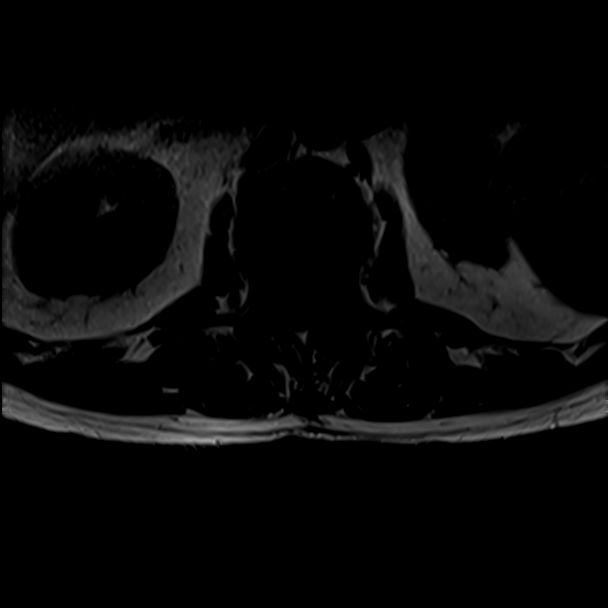
[im 7/39]
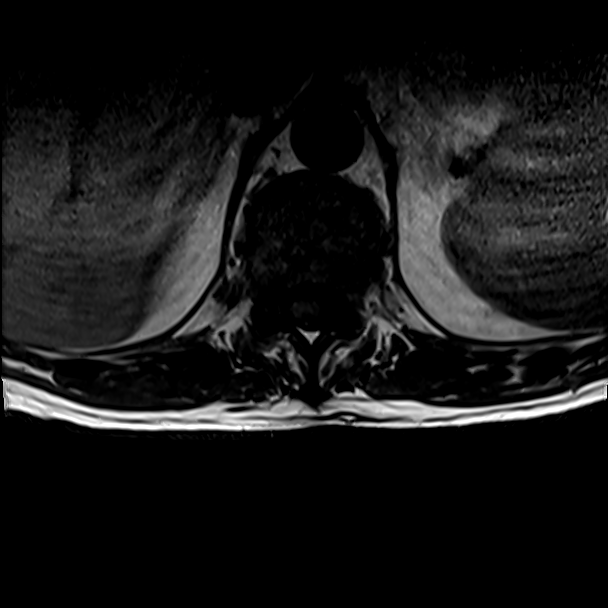
[im 13/39]
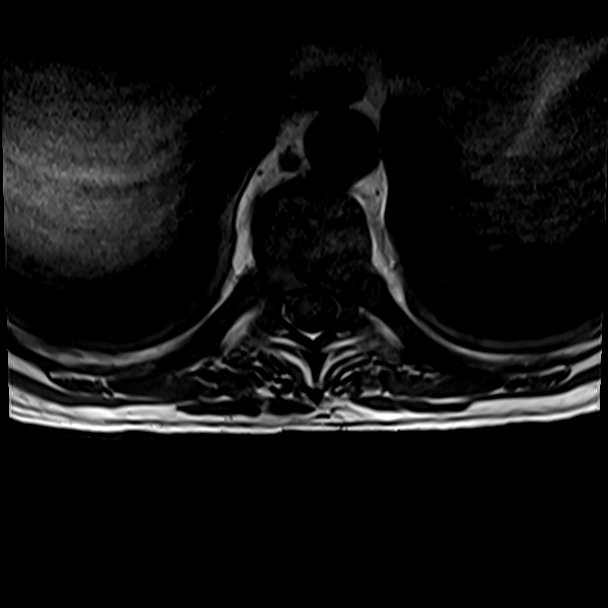

[22 of 48 positions shown; findings below may reference images not displayed]

FINDINGS: MRI THORACIC SPINE FINDINGS

Alignment:  Physiologic

Vertebrae: Confluent marrow infiltration throughout the spine with
heterogeneous patchy enhancement on postcontrast imaging. Extensive
medial rib infiltration is also present. No pathologic fracture. No
measurable extraosseous tumor extension. No cord impingement

Cord:  Normal signal and morphology.

Paraspinal and other soft tissues: Negative

Disc levels:

No degenerative impingement.
IMPRESSION: Confluent osseous metastatic disease in the thoracic spine without
pathologic fracture or neural impingement.

## 2020-03-04 IMAGING — MR MR LUMBAR SPINE WO/W CM
4 of 7 series · 26 of 48 positions shown · IV contrast (gadavist)
Comparison: None.

CLINICAL DATA: Cancer of unknown primary

EXAM:
MRI LUMBAR SPINE WITHOUT AND WITH CONTRAST
TECHNIQUE: Multiplanar and multiecho pulse sequences of the lumbar spine were
obtained without and with intravenous contrast.
CONTRAST:  7mL GADAVIST GADOBUTROL 1 MMOL/ML IV SOLN

[Series 1: T2 · sagittal · 4.0mm · 0.73mm/px · 6 of 18 slices shown (1 of 2)]
[im 1/18]
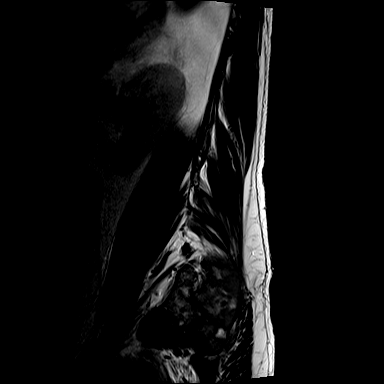
[im 4/18]
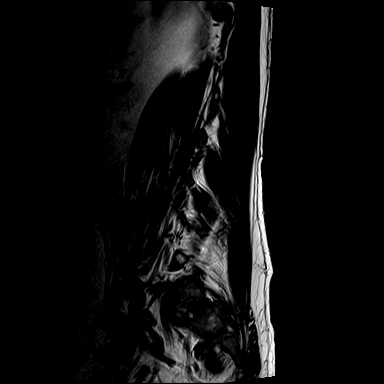
[im 7/18]
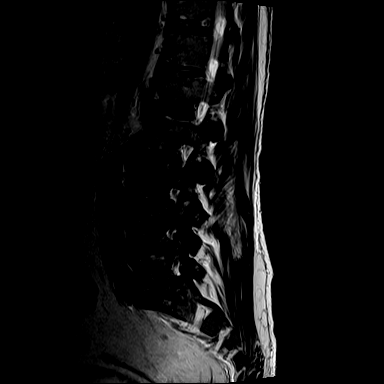
[im 11/18]
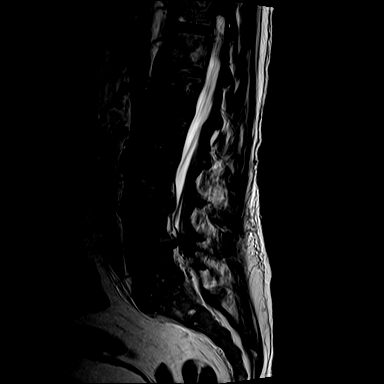
[im 14/18]
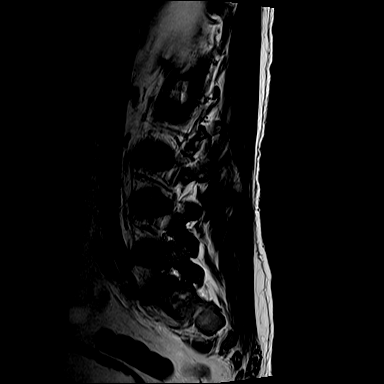
[im 18/18]
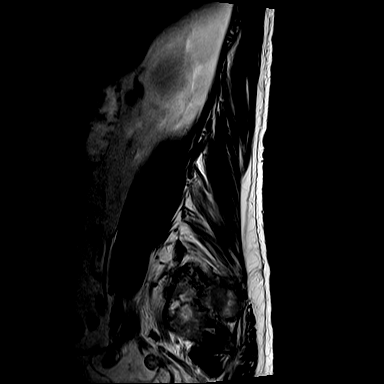

[Series 3: T1 · sagittal · 4.0mm · 0.88mm/px · 5 of 18 slices shown (1 of 2)]
[im 1/18]
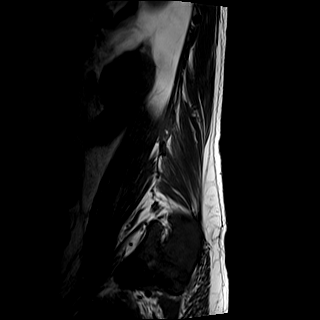
[im 5/18]
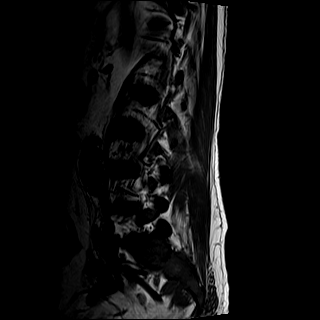
[im 9/18]
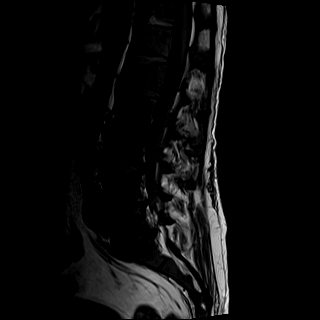
[im 13/18]
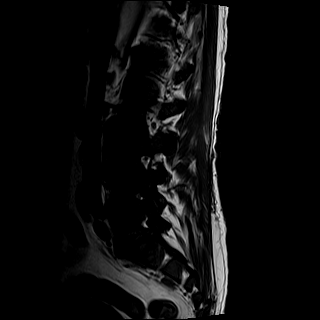
[im 18/18]
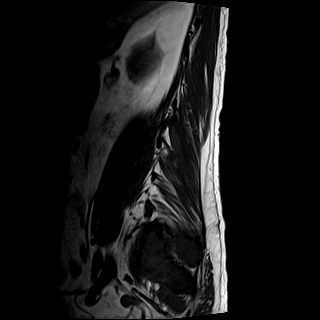

[Series 4: T2 · axial · 5.0mm · 0.57mm/px · z∈[-546,-294]mm · 9 of 31 slices shown (2 of 2)]
[im 1/31]
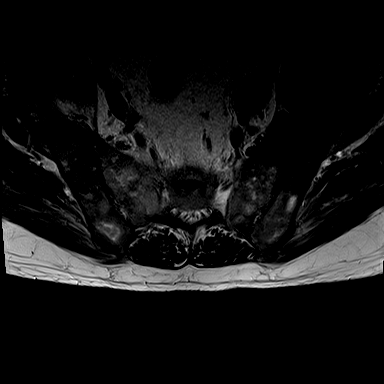
[im 4/31]
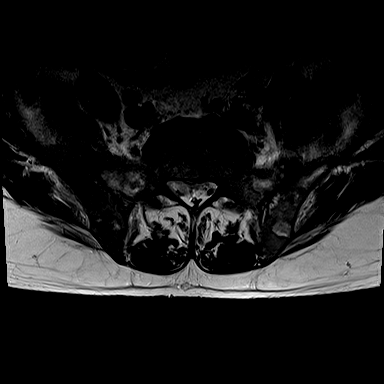
[im 8/31]
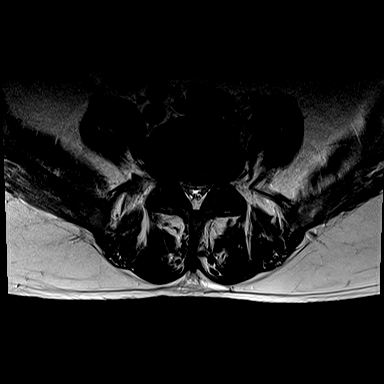
[im 12/31]
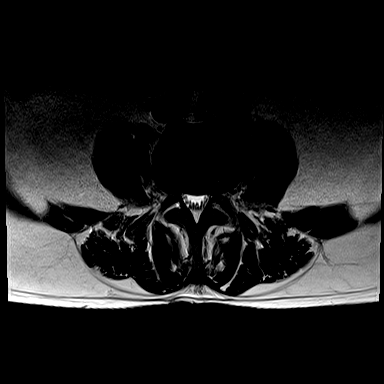
[im 16/31]
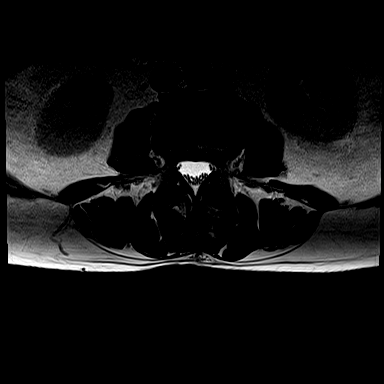
[im 19/31]
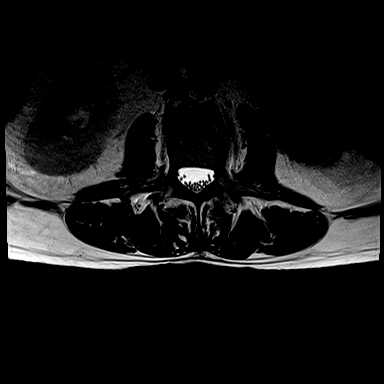
[im 23/31]
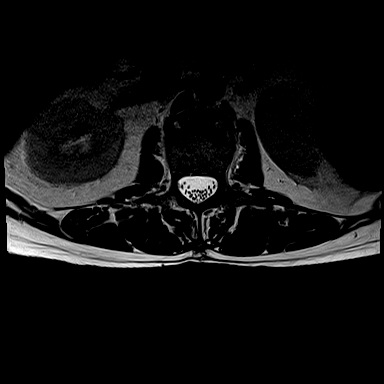
[im 27/31]
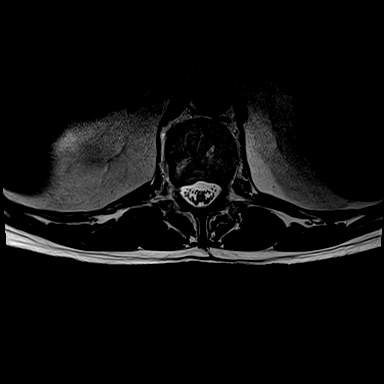
[im 31/31]
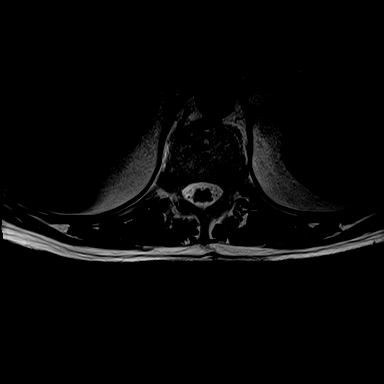

[Series 5: T1 · axial · 5.0mm · 0.34mm/px · z∈[-546,-324]mm · 6 of 31 slices shown (2 of 2)]
[im 1/31]
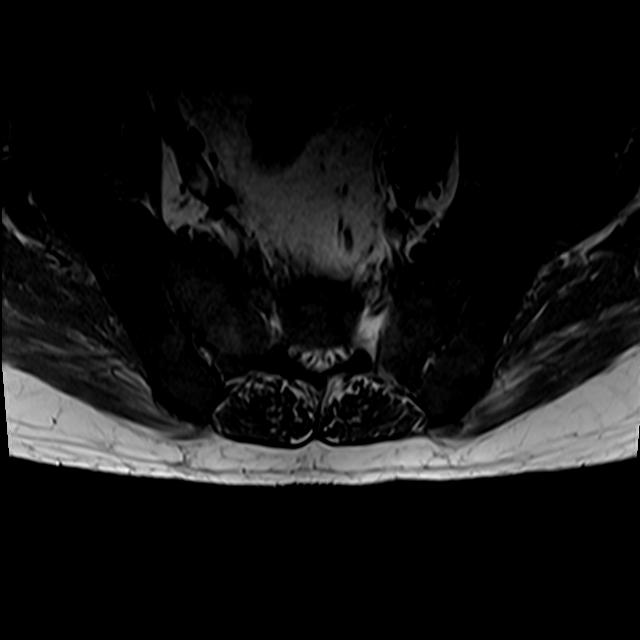
[im 4/31]
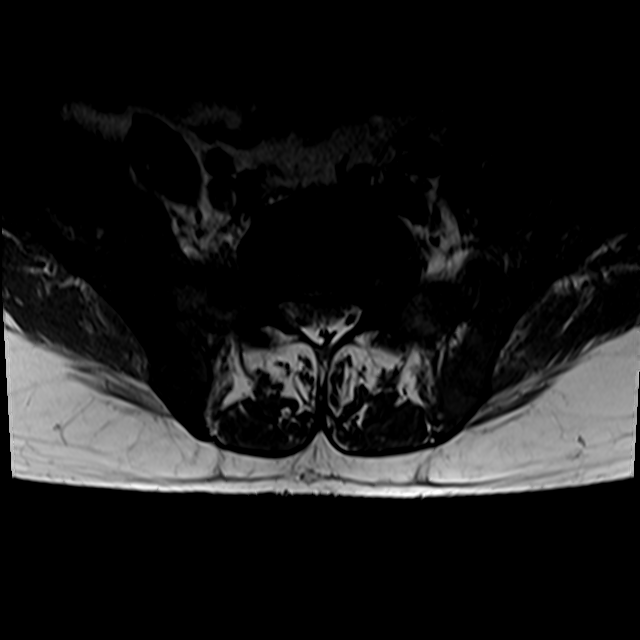
[im 8/31]
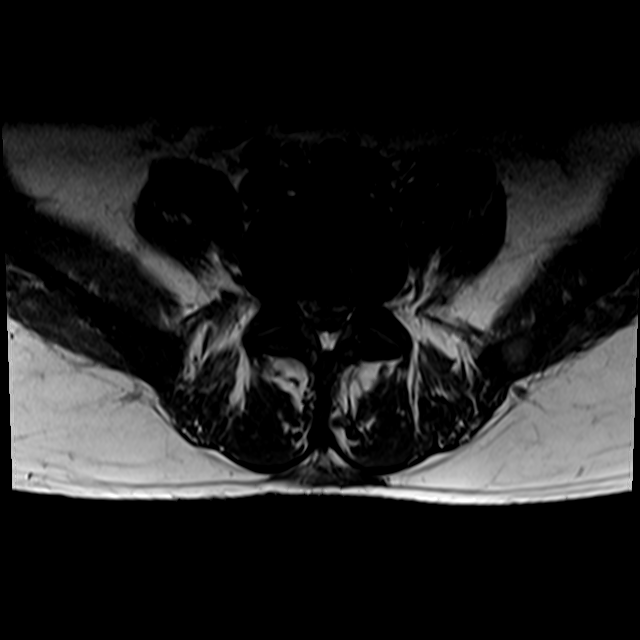
[im 12/31]
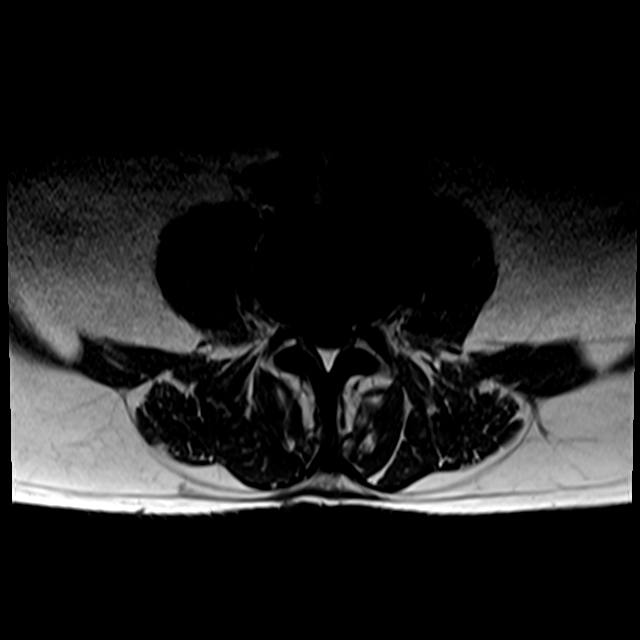
[im 16/31]
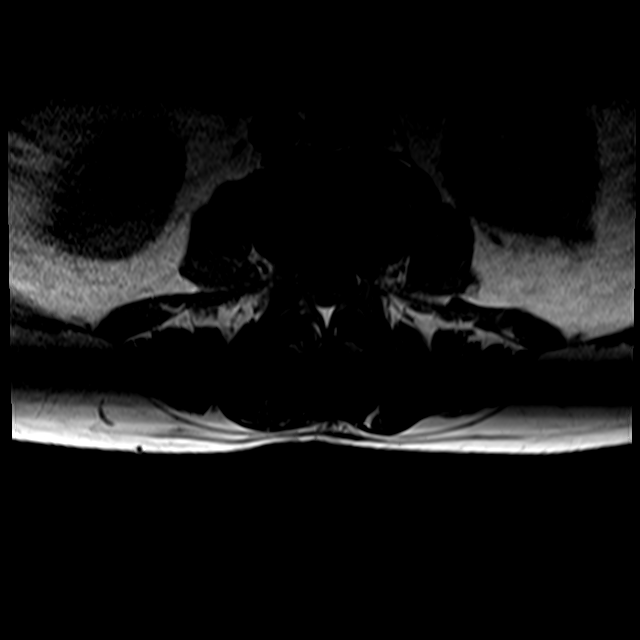
[im 27/31]
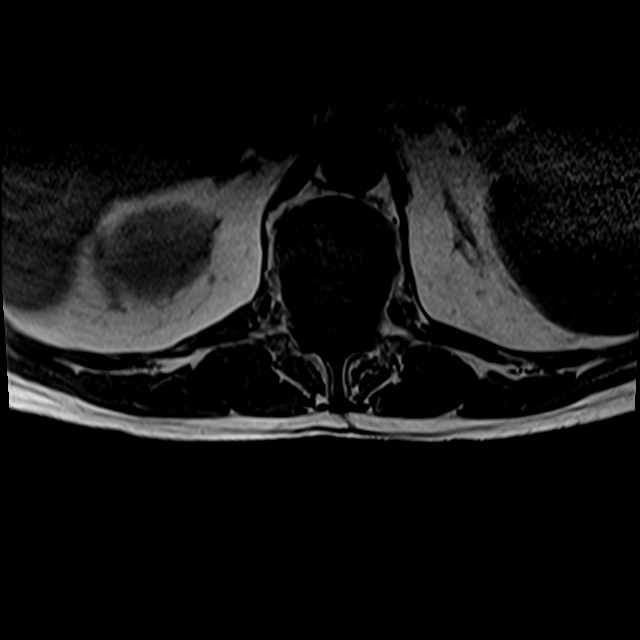

[26 of 48 positions shown; findings below may reference images not displayed]

FINDINGS: No available sagittal T2 weighted imaging.

Segmentation:  5 lumbar type vertebrae

Alignment:  Slight retrolisthesis at L5-S1

Vertebrae: Confluent osseous metastatic disease with diffuse
hypoattenuating marrow and patchy enhancement affecting every level
and the visualized pelvis. Confirmed ventral epidural tumor
infiltration at the level of S1, impinging on the right for
descending S1 nerve root. There is also extraosseous tumor extension
ventrally from the right sacroiliac joint region and ventrally from
the left ilium. No pathologic fracture.

Conus medullaris and cauda equina: Conus extends to the L1-2 level.
Conus and cauda equina appear normal.

Paraspinal and other soft tissues: Retroperitoneal adenopathy,
known.

Disc levels:

Disc and facet degeneration in the lower lumbar spine with up to
moderate right foraminal stenosis at L4-5. Moderate spinal stenosis
at L4-5 and L5-S1.
IMPRESSION: 1. Confluent metastatic disease in the lumbar spine and pelvis with
ventral epidural tumor infiltration at S1 impinging on the right S1
nerve root.
2. Extraosseous tumor growth ventrally from the bilateral ilium.

## 2020-03-04 IMAGING — MR MR CERVICAL SPINE WO/W CM
6 of 7 series · 32 of 48 positions shown · IV contrast (Gadavist)
Comparison: None.

CLINICAL DATA: Cancer staging.  Right cervical radiculopathy. a

EXAM:
MRI CERVICAL SPINE WITHOUT AND WITH CONTRAST
TECHNIQUE: Multiplanar and multiecho pulse sequences of the cervical spine, to
include the craniocervical junction and cervicothoracic junction,
were obtained without and with intravenous contrast.
CONTRAST:  7mL GADAVIST GADOBUTROL 1 MMOL/ML IV SOLN

[Series 2: T1 · sagittal · 3.0mm · 0.69mm/px · 5 of 17 slices shown]
[im 1/17]
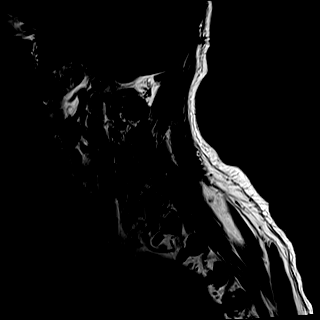
[im 5/17]
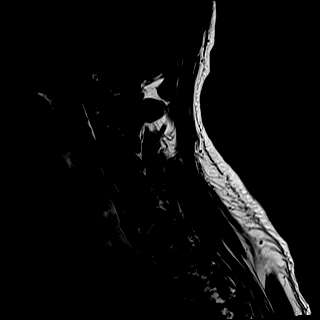
[im 9/17]
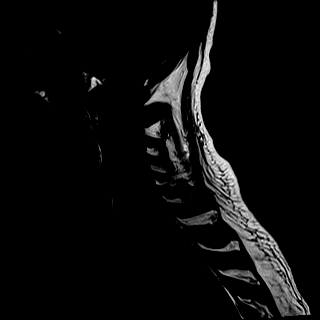
[im 13/17]
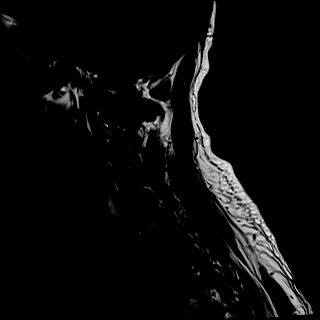
[im 17/17]
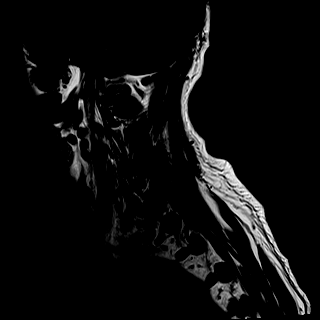

[Series 3: T2 · sagittal · 3.0mm · 0.69mm/px · 5 of 17 slices shown (1 of 2)]
[im 1/17]
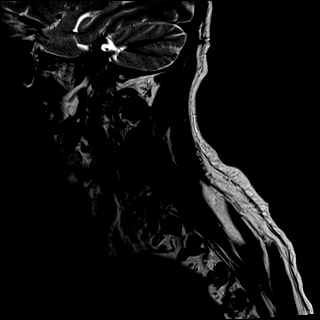
[im 5/17]
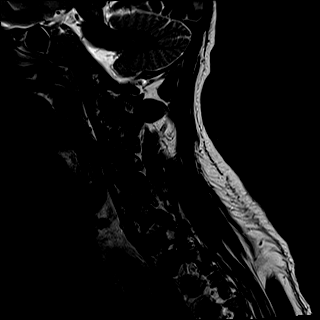
[im 9/17]
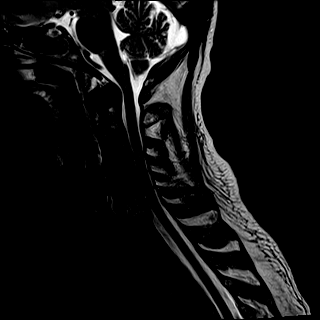
[im 13/17]
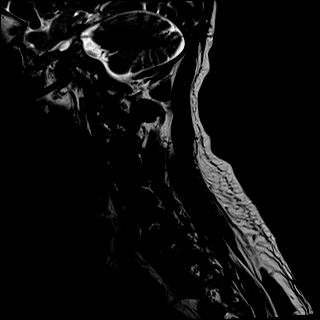
[im 17/17]
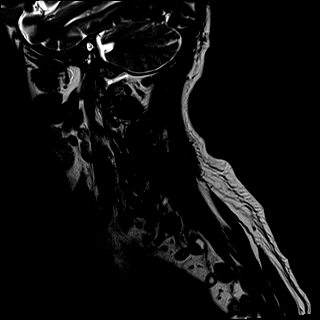

[Series 4: STIR · sagittal · 3.0mm · 0.86mm/px · 4 of 17 slices shown]
[im 1/17]
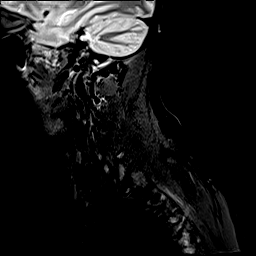
[im 6/17]
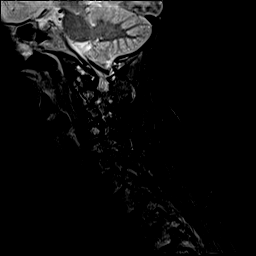
[im 11/17]
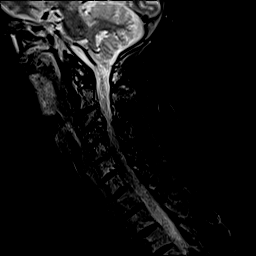
[im 17/17]
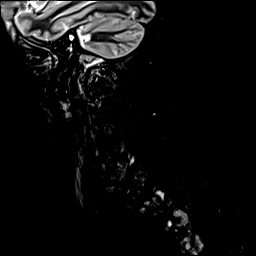

[Series 5: T2 · axial · 3.0mm · 0.66mm/px · z∈[-139,-17]mm · 8 of 40 slices shown (2 of 2)]
[im 1/40]
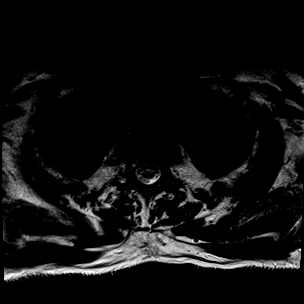
[im 5/40]
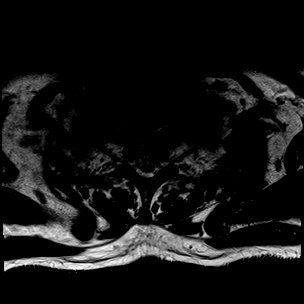
[im 14/40]
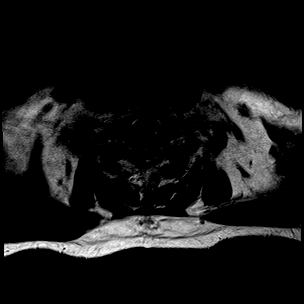
[im 18/40]
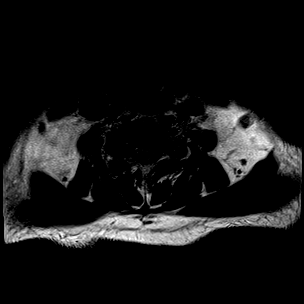
[im 22/40]
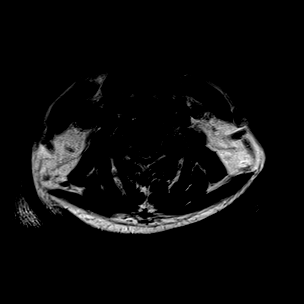
[im 27/40]
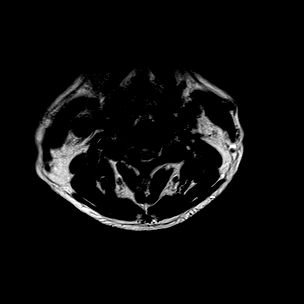
[im 35/40]
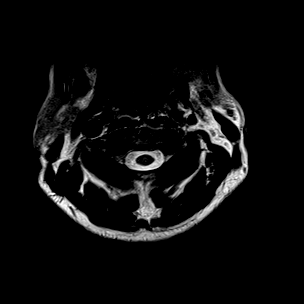
[im 40/40]
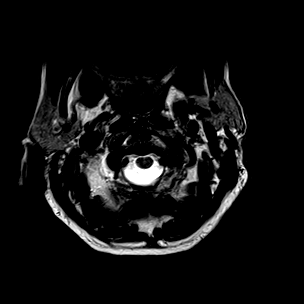

[Series 8: T1 post-contrast · axial · 3.0mm · 0.35mm/px · z∈[-136,-14]mm · 8 of 40 slices shown (1 of 2)]
[im 1/40]
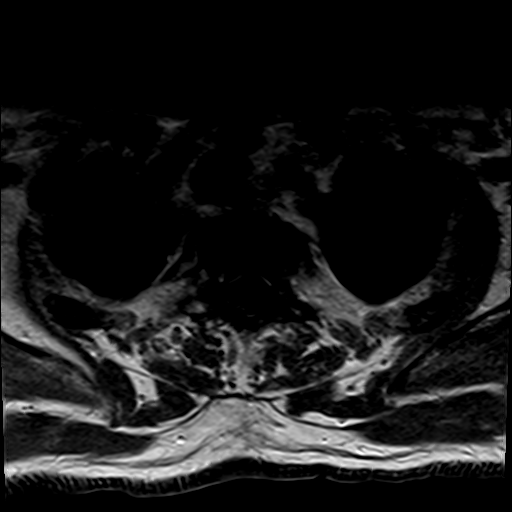
[im 5/40]
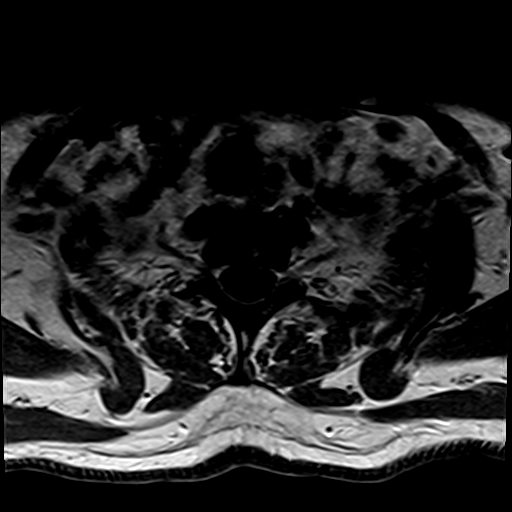
[im 14/40]
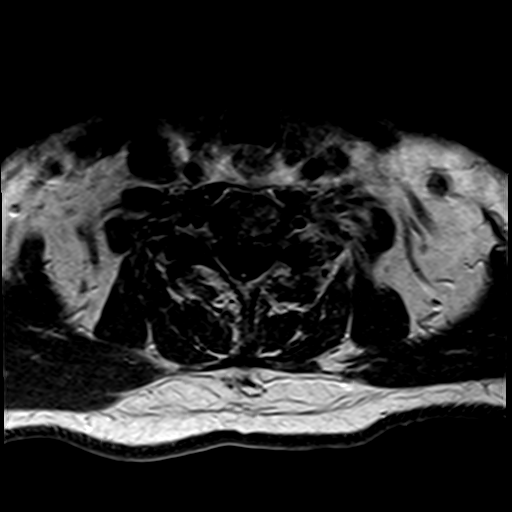
[im 18/40]
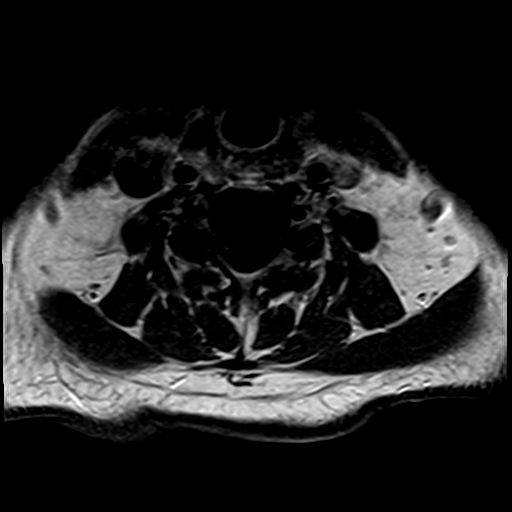
[im 22/40]
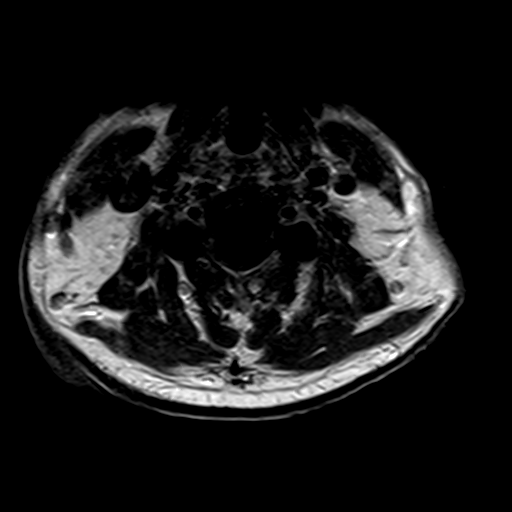
[im 27/40]
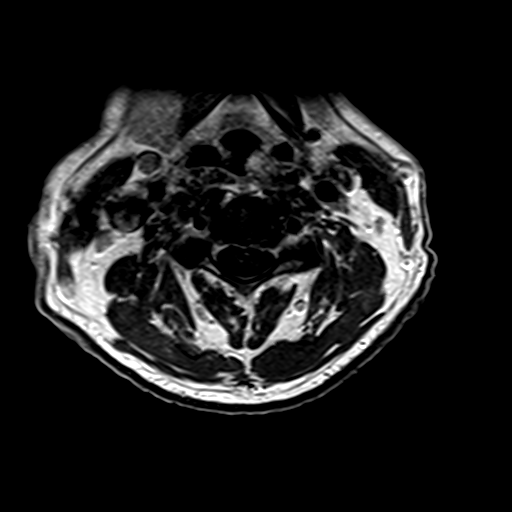
[im 35/40]
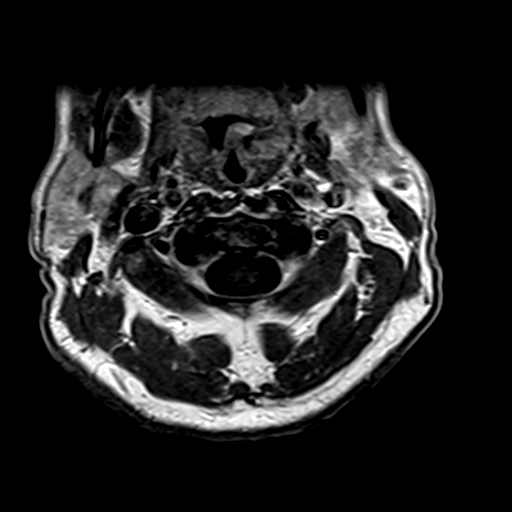
[im 40/40]
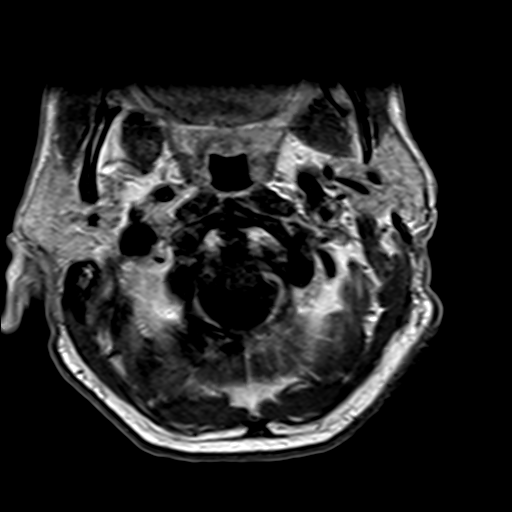

[Series 9: T1 post-contrast · sagittal · 3.0mm · 0.43mm/px · 2 of 15 slices shown (2 of 2)]
[im 1/15]
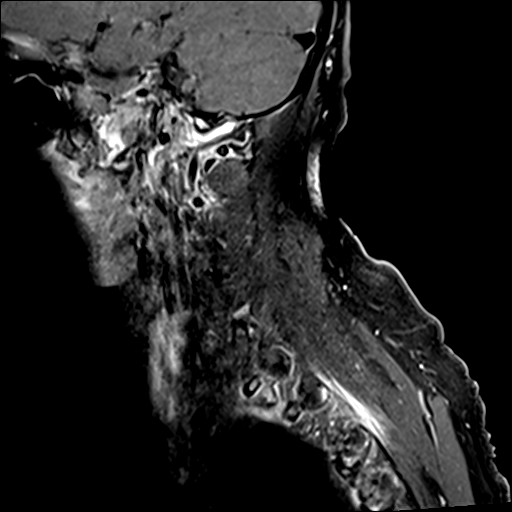
[im 5/15]
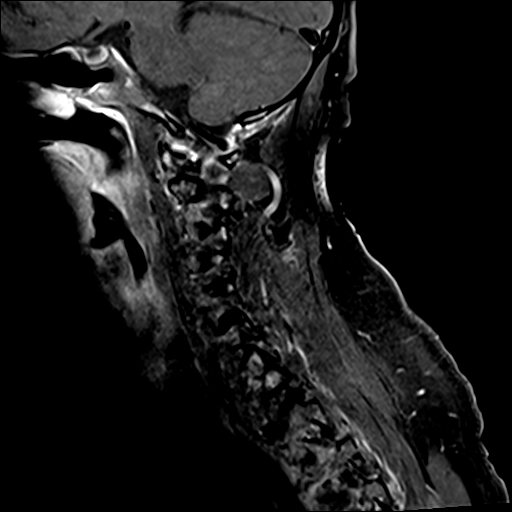

[32 of 48 positions shown; findings below may reference images not displayed]

FINDINGS: Alignment: Normal

Vertebrae: Infiltrative low marrow signal throughout the skull base
and cervical spine with patchy increased enhancement especially at
the C1 posterior arch, C2 body and spinous process, C4 body, C6
spinous process, and in the upper thoracic spine as described
separately. Negative for pathologic fracture. There is asymmetric
enhancing material in the right canal at the level of C5-6, which is
likely epidural tumor extension in this patient with right
radiculopathy. No associated cord compression, question right C6
radiculopathy.

Cord: Normal signal and morphology. No abnormal intrathecal
enhancement

Posterior Fossa, vertebral arteries, paraspinal tissues:
Unremarkable appearance of the brain

Disc levels:

Disc degeneration primarily at C3-4 to C6-7 and greatest at C3-4
where there is a disc protrusion contacting the ventral cord and
bilateral uncovertebral spurring causing foraminal impingement.
IMPRESSION: 1. Widespread osseous metastatic disease.
2. Suspect right-sided epidural tumor infiltration at C5-6.
3. No pathologic fracture.

## 2020-03-04 SURGERY — EGD (ESOPHAGOGASTRODUODENOSCOPY)
Anesthesia: Monitor Anesthesia Care

## 2020-03-04 MED ORDER — NICOTINE 14 MG/24HR TD PT24
14.0000 mg | MEDICATED_PATCH | Freq: Every day | TRANSDERMAL | Status: DC
  Administered 2020-03-04 – 2020-03-08 (×5): 14 mg via TRANSDERMAL
  Filled 2020-03-04 (×5): qty 1

## 2020-03-04 MED ORDER — DEGARELIX ACETATE(240 MG DOSE) 120 MG/VIAL ~~LOC~~ SOLR
240.0000 mg | Freq: Once | SUBCUTANEOUS | Status: AC
  Administered 2020-03-04: 240 mg via SUBCUTANEOUS
  Filled 2020-03-04: qty 6

## 2020-03-04 MED ORDER — HYDROMORPHONE HCL 1 MG/ML IJ SOLN
0.5000 mg | INTRAMUSCULAR | Status: DC | PRN
  Administered 2020-03-04 – 2020-03-05 (×3): 0.5 mg via INTRAVENOUS
  Filled 2020-03-04 (×3): qty 1

## 2020-03-04 MED ORDER — OXYCODONE-ACETAMINOPHEN 5-325 MG PO TABS
1.0000 | ORAL_TABLET | ORAL | Status: DC | PRN
  Administered 2020-03-04 (×2): 2 via ORAL
  Administered 2020-03-04: 1 via ORAL
  Administered 2020-03-04 – 2020-03-05 (×2): 2 via ORAL
  Filled 2020-03-04: qty 1
  Filled 2020-03-04 (×4): qty 2

## 2020-03-04 MED ORDER — GADOBUTROL 1 MMOL/ML IV SOLN
7.0000 mL | Freq: Once | INTRAVENOUS | Status: AC | PRN
  Administered 2020-03-04: 7 mL via INTRAVENOUS

## 2020-03-04 NOTE — Progress Notes (Signed)
Progress Note    Edgar Mooney  MCN:[id number] DOB: 12/03/1965  DOA: 03/03/2020 PCP: Patient, No Pcp Per    Brief Narrative:    Medical records reviewed and are as summarized below:  Edgar Mooney is an 54 y.o. 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 Dorko was interviewed and examined.  The clinical presentation is consistent with a diagnosis of metastatic prostate cancer with widespread bone metastases.  I discussed the diagnosis and treatment options with Mr. Ellena.  No therapy will be curative.  There is a high chance of clinical improvement with androgen deprivation therapy. He reports chronic symptoms at the right neck.  The spine tumor should respond to antiestrogen therapy.  I will defer the decision on surgical intervention and radiation to the neurosurgeon. I recommend beginning treatment with a GnRH antagonist.  We will start additional antiandrogen therapy, abiraterone/prednisone, as an outpatient.  We will also add biphosphonate therapy as an outpatient. He can be discharged to home with an oral pain regimen and follow-up at the Cancer center within the next 2 weeks. -transition from IV pain meds to PO  Urinary retention, acute on chronic -Start Flomax  Chronic hep C status post antiviral treatment 2 years ago  HTN -Hold BP meds for now.  Tobacco abuse -encourage cessation   Family Communication/Anticipated D/C date and plan/Code Status   DVT prophylaxis: Lovenox ordered. Code Status: Full Code.  Disposition Plan: Status is: Inpatient  Remains inpatient appropriate because:IV treatments appropriate due to intensity of illness or inability to take PO   Dispo: The patient is from: Home              Anticipated d/c is to: Home               Anticipated d/c date is: 1 day              Patient currently is not medically stable to d/c.         Medical Consultants:    GI  Heme onc  Subjective:   Does not want an EGD  Objective:    Vitals:   03/03/20 1804 03/03/20 2110 03/04/20 0009 03/04/20 0855  BP: (!) 145/76 108/61 108/67 116/72  Pulse: 98 79 73 80  Resp: 19 20 18 16   Temp: (!) 100.7 F (38.2 C) 99.1 F (37.3 C) 98.4 F (36.9 C) 97.9 F (36.6 C)  TempSrc: Oral Oral Oral Oral  SpO2: 95% 97% 96%   Weight:  67.4 kg    Height:        Intake/Output Summary (Last 24 hours) at 03/04/2020 1404 Last data filed at 03/03/2020 2050 Gross per 24 hour  Intake 1045 ml  Output 300 ml  Net 745 ml   Filed Weights   03/03/20 0936 03/03/20 1700 03/03/20 2110  Weight: 68 kg 67.3 kg 67.4 kg    Exam:  General: Appearance:     Overweight male in no acute distress     Lungs:     respirations unlabored  Heart:    Normal heart rate. Normal rhythm. No murmurs, rubs, or gallops.   MS:   All extremities are intact.   Neurologic:   Awake, alert, oriented x 3. No apparent focal neurological  defect.     Data Reviewed:   I have personally reviewed following labs and imaging studies:  Labs: Labs show the following:   Basic Metabolic Panel: Recent Labs  Lab 03/03/20 0948 03/04/20 0500  NA 136 135  K 4.0 4.4  CL 103 105  CO2 21* 21*  GLUCOSE 158* 117*  BUN 19 13  CREATININE 0.98 0.97  CALCIUM 9.0 9.0   GFR Estimated Creatinine Clearance: 73.5 mL/min (by C-G formula based on SCr of 0.97 mg/dL). Liver Function Tests: Recent Labs  Lab 03/03/20 0948  AST 14*  ALT 8  ALKPHOS 148*  BILITOT 1.3*  PROT 6.5  ALBUMIN 2.9*   No results for input(s): LIPASE, AMYLASE in the last 168 hours. No results for input(s): AMMONIA in the last 168 hours. Coagulation profile Recent Labs  Lab 03/03/20 2335  INR 1.4*    CBC: Recent Labs  Lab 03/03/20 0948 03/03/20 2335 03/04/20 0500  WBC 5.9  --   6.0  HGB 5.8* 7.8* 8.4*  HCT 19.8* 24.1* 26.3*  MCV 93.4  --  87.1  PLT 110*  --  101*   Cardiac Enzymes: No results for input(s): CKTOTAL, CKMB, CKMBINDEX, TROPONINI in the last 168 hours. BNP (last 3 results) No results for input(s): PROBNP in the last 8760 hours. CBG: No results for input(s): GLUCAP in the last 168 hours. D-Dimer: No results for input(s): DDIMER in the last 72 hours. Hgb A1c: No results for input(s): HGBA1C in the last 72 hours. Lipid Profile: No results for input(s): CHOL, HDL, LDLCALC, TRIG, CHOLHDL, LDLDIRECT in the last 72 hours. Thyroid function studies: No results for input(s): TSH, T4TOTAL, T3FREE, THYROIDAB in the last 72 hours.  Invalid input(s): FREET3 Anemia work up: Recent Labs    03/03/20 1121 03/03/20 2335  FERRITIN 1,319*  --   TIBC 294  --   IRON 14*  --   RETICCTPCT  --  4.7*   Sepsis Labs: Recent Labs  Lab 03/03/20 0948 03/04/20 0500  WBC 5.9 6.0    Microbiology Recent Results (from the past 240 hour(s))  Respiratory Panel by RT PCR (Flu A&B, Covid) - Nasopharyngeal Swab     Status: None   Collection Time: 03/03/20 11:20 AM   Specimen: Nasopharyngeal Swab; Nasopharyngeal(NP) swabs in vial transport medium  Result Value Ref Range Status   SARS Coronavirus 2 by RT PCR NEGATIVE NEGATIVE Final    Comment: (NOTE) SARS-CoV-2 target nucleic acids are NOT DETECTED.  The SARS-CoV-2 RNA is generally detectable in upper respiratoy specimens during the acute phase of infection. The lowest concentration of SARS-CoV-2 viral copies this assay can detect is 131 copies/mL. A negative result does not preclude SARS-Cov-2 infection and should not be used as the sole basis for treatment or other patient management decisions. A negative result may occur with  improper specimen collection/handling, submission of specimen other than nasopharyngeal swab, presence of viral mutation(s) within the areas targeted by this assay, and inadequate number  of viral copies (<131 copies/mL). A negative result must be combined with clinical observations, patient history, and epidemiological information. The expected result is Negative.  Fact Sheet for Patients:  PinkCheek.be  Fact Sheet for Healthcare Providers:  GravelBags.it  This test is no t yet approved or cleared by the [place] FDA and  has been authorized for detection and/or diagnosis of SARS-CoV-2 by FDA under an Emergency Use Authorization (EUA). This EUA will remain  in effect (meaning this test can be used) for the duration of  the COVID-19 declaration under Section 564(b)(1) of the Act, 21 U.S.C. section 360bbb-3(b)(1), unless the authorization is terminated or revoked sooner.     Influenza A by PCR NEGATIVE NEGATIVE Final   Influenza B by PCR NEGATIVE NEGATIVE Final    Comment: (NOTE) The Xpert Xpress SARS-CoV-2/FLU/RSV assay is intended as an aid in  the diagnosis of influenza from Nasopharyngeal swab specimens and  should not be used as a sole basis for treatment. Nasal washings and  aspirates are unacceptable for Xpert Xpress SARS-CoV-2/FLU/RSV  testing.  Fact Sheet for Patients: PinkCheek.be  Fact Sheet for Healthcare Providers: GravelBags.it  This test is not yet approved or cleared by the [place] FDA and  has been authorized for detection and/or diagnosis of SARS-CoV-2 by  FDA under an Emergency Use Authorization (EUA). This EUA will remain  in effect (meaning this test can be used) for the duration of the  Covid-19 declaration under Section 564(b)(1) of the Act, 21  U.S.C. section 360bbb-3(b)(1), unless the authorization is  terminated or revoked. Performed at [facility], [place] [address], [place], [place] [address]     Procedures and diagnostic studies:  DG Chest 2 View  Result Date: 03/03/2020 CLINICAL DATA:  Chest  pain, shortness of breath EXAM: CHEST - 2 VIEW COMPARISON:  04/25/2012 FINDINGS: The heart size and mediastinal contours are within normal limits. Mildly coarsened interstitial markings bilaterally. No focal airspace consolidation, pleural effusion, or pneumothorax. Degenerative changes of the bilateral shoulders. IMPRESSION: Mildly coarsened interstitial markings bilaterally, which may reflect bronchitic type lung changes. No focal airspace consolidation. Electronically Signed   By: Davina Poke D.O.   On: 03/03/2020 10:04   MR CERVICAL SPINE W WO CONTRAST  Result Date: 03/04/2020 CLINICAL DATA:  Cancer staging.  Right cervical radiculopathy. a EXAM: MRI CERVICAL SPINE WITHOUT AND WITH CONTRAST TECHNIQUE: Multiplanar and multiecho pulse sequences of the cervical spine, to include the craniocervical junction and cervicothoracic junction, were obtained without and with intravenous contrast. CONTRAST:  60mL GADAVIST GADOBUTROL 1 MMOL/ML IV SOLN COMPARISON:  None. FINDINGS: Alignment: Normal Vertebrae: Infiltrative low marrow signal throughout the skull base and cervical spine with patchy increased enhancement especially at the C1 posterior arch, C2 body and spinous process, C4 body, C6 spinous process, and in the upper thoracic spine as described separately. Negative for pathologic fracture. There is asymmetric enhancing material in the right canal at the level of C5-6, which is likely epidural tumor extension in this patient with right radiculopathy. No associated cord compression, question right C6 radiculopathy. Cord: Normal signal and morphology. No abnormal intrathecal enhancement Posterior Fossa, vertebral arteries, paraspinal tissues: Unremarkable appearance of the brain Disc levels: Disc degeneration primarily at C3-4 to C6-7 and greatest at C3-4 where there is a disc protrusion contacting the ventral cord and bilateral uncovertebral spurring causing foraminal impingement. IMPRESSION: 1. Widespread  osseous metastatic disease. 2. Suspect right-sided epidural tumor infiltration at C5-6. 3. No pathologic fracture. Electronically Signed   By: Monte Fantasia M.D.   On: 03/04/2020 05:58   MR THORACIC SPINE W WO CONTRAST  Result Date: 03/04/2020 CLINICAL DATA:  Cancer bone and a primary.  Neck pain. EXAM: MRI THORACIC WITHOUT AND WITH CONTRAST TECHNIQUE: Multiplanar and multiecho pulse sequences of the thoracic spine were obtained without and with intravenous contrast. CONTRAST:  49mL GADAVIST GADOBUTROL 1 MMOL/ML IV SOLN COMPARISON:  Chest CTA from yesterday FINDINGS: MRI THORACIC SPINE FINDINGS Alignment:  Physiologic Vertebrae: Confluent marrow infiltration throughout the spine with heterogeneous patchy enhancement on  postcontrast imaging. Extensive medial rib infiltration is also present. No pathologic fracture. No measurable extraosseous tumor extension. No cord impingement Cord:  Normal signal and morphology. Paraspinal and other soft tissues: Negative Disc levels: No degenerative impingement. IMPRESSION: Confluent osseous metastatic disease in the thoracic spine without pathologic fracture or neural impingement. Electronically Signed   By: Monte Fantasia M.D.   On: 03/04/2020 05:31   MR Lumbar Spine W Wo Contrast  Result Date: 03/04/2020 CLINICAL DATA:  Cancer of unknown primary EXAM: MRI LUMBAR SPINE WITHOUT AND WITH CONTRAST TECHNIQUE: Multiplanar and multiecho pulse sequences of the lumbar spine were obtained without and with intravenous contrast. CONTRAST:  36mL GADAVIST GADOBUTROL 1 MMOL/ML IV SOLN COMPARISON:  None. FINDINGS: No available sagittal T2 weighted imaging. Segmentation:  5 lumbar type vertebrae Alignment:  Slight retrolisthesis at L5-S1 Vertebrae: Confluent osseous metastatic disease with diffuse hypoattenuating marrow and patchy enhancement affecting every level and the visualized pelvis. Confirmed ventral epidural tumor infiltration at the level of S1, impinging on the right for  descending S1 nerve root. There is also extraosseous tumor extension ventrally from the right sacroiliac joint region and ventrally from the left ilium. No pathologic fracture. Conus medullaris and cauda equina: Conus extends to the L1-2 level. Conus and cauda equina appear normal. Paraspinal and other soft tissues: Retroperitoneal adenopathy, known. Disc levels: Disc and facet degeneration in the lower lumbar spine with up to moderate right foraminal stenosis at L4-5. Moderate spinal stenosis at L4-5 and L5-S1. IMPRESSION: 1. Confluent metastatic disease in the lumbar spine and pelvis with ventral epidural tumor infiltration at S1 impinging on the right S1 nerve root. 2. Extraosseous tumor growth ventrally from the bilateral ilium. Electronically Signed   By: Monte Fantasia M.D.   On: 03/04/2020 05:56   CT Angio Chest/Abd/Pel for Dissection W and/or Wo Contrast  Result Date: 03/03/2020 CLINICAL DATA:  Abdominal pain with acute aortic dissection suspected EXAM: CT ANGIOGRAPHY CHEST, ABDOMEN AND PELVIS TECHNIQUE: Non-contrast CT of the chest was initially obtained. Multidetector CT imaging through the chest, abdomen and pelvis was performed using the standard protocol during bolus administration of intravenous contrast. Multiplanar reconstructed images and MIPs were obtained and reviewed to evaluate the vascular anatomy. CONTRAST:  113mL OMNIPAQUE IOHEXOL 350 MG/ML SOLN COMPARISON:  None. FINDINGS: CTA CHEST FINDINGS Cardiovascular: No intramural aortic hematoma on noncontrast phase. Normal heart size. No pericardial effusion. Mediastinum/Nodes: Negative for adenopathy or mass. Lungs/Pleura: Subpleural nodule in the anterior right chest which is related to a rib lesion. No pulmonary nodule or consolidation Musculoskeletal: Diffuse heterogeneous bony density patchy areas of sclerosis and lucency. Pathologic anterior right third rib fracture. 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 Review  of the MIP images confirms the above findings. CTA ABDOMEN AND PELVIS FINDINGS VASCULAR Aorta: Atheromatous wall thickening.  No dissection or aneurysm Celiac: Patent without evidence of aneurysm, dissection, vasculitis or significant stenosis. SMA: Moderate atheromatous narrowing proximally. No acute finding including branch occlusion. Renals: Single bilateral renal arteries which are smooth and widely patent. IMA: Patent Inflow: Atheromatous plaque without flow limiting stenosis or dissection. Veins: Negative in the arterial phase Review of the MIP images confirms the above findings. NON-VASCULAR Hepatobiliary: Lobulated liver surface compatible with cirrhosis. There is known hepatitis C.no evidence of biliary obstruction or stone. Pancreas: Coarse calcification at the head, likely post inflammatory. No acute finding. Spleen: Generous size in the setting of presumed portal hypertension. Adrenals/Urinary Tract: Negative adrenals. No hydronephrosis or stone. Unremarkable bladder. Stomach/Bowel:  No obstruction. Mild distal  colonic diverticulosis. Lymphatic: Lymphadenopathy in the pelvis and lower abdomen which is retroperitoneal. A rounded node left of the distal aorta measures 21 mm in short axis. A left external iliac node measures 2 cm in short axis on 6:161 Reproductive:Enlarged prostate with eccentric left nodularity at the bladder base. Other: No ascites or pneumoperitoneum. Musculoskeletal: Extensive sclerotic metastatic disease in the lumbar spine and pelvis with aggressive periosteal reaction along the bilateral iliac fossa. Multilevel lumbar spine degeneration with spinal stenosis at L3-4 and below. Tumor may infiltrate the right eccentric and ventral sacral spinal canal at the level of S1. No acute fracture. Review of the MIP images confirms the above findings. IMPRESSION: 1. Widespread osseous metastatic disease and lower retroperitoneal adenopathy. Favor prostate primary. 2. Pathologic right anterior  third rib fracture. 3. 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 4. Cirrhosis with splenomegaly. 5. No acute aortic finding. Electronically Signed   By: Monte Fantasia M.D.   On: 03/03/2020 11:56    Medications:   . ferrous gluconate  324 mg Oral BID WC  . LORazepam  0.5 mg Intravenous Once  . tamsulosin  0.4 mg Oral QPC supper   Continuous Infusions: . sodium chloride    . sodium chloride Stopped (03/04/20 0700)     LOS: 1 day   Geradine Girt  [facility]   How to contact the [facility] Attending or Consulting provider [place] or covering provider during after hours [place], for this patient?  1. Check the care team in [facility] and look for a) attending/consulting [facility] provider listed and b) the [facility] team listed 2. Log into [email or web address] and use [place]'s universal password to access. If you do not have the password, please contact the hospital operator. 3. Locate the [facility] provider you are looking for under [facility] and page to a number that you can be directly reached. 4. If you still have difficulty reaching the provider, please page the [facility] (Director on Call) for the Hospitalists listed on amion for assistance.  03/04/2020, 2:04 PM

## 2020-03-04 NOTE — Progress Notes (Signed)
Patient ID: Edgar Mooney, male   DOB: [birth year], 54 y.o.   MRN: [id number]   EGD cancelled due to likelihood anemia is from his bone mets after discussion with Dr. Benay Spice. If GI bleeding develops, then will re-evaluate. Ok to eat solid food. Will sign off. Call if questions.

## 2020-03-04 NOTE — Progress Notes (Signed)
Tech responded to patient room. Tech remarked that it smelled like cigarette smoke. Pt denied. Student RN opened bathroom door. Bathroom smelled intensely of smoke. Pt admitted to smoking. Pt was educated that this is a tobacco free campus and that he could burn down the building by smoking inside. Pt agreed not to do it again. RN Angela Nevin was able to confiscate the contraband. Discussed with patient's RN the need for nicotine patch and Chaplain consult r/t new terminal diagnosis.

## 2020-03-04 NOTE — TOC Initial Note (Signed)
Transition of Care [facility]) - Initial/Assessment Note    Patient Details  Name: Edgar Mooney MRN: [id number] Date of Birth: 1965/05/21  Transition of Care [facility]) CM/SW Contact:    Bartholomew Crews, RN Phone Number: [phone] 03/04/2020, 5:25 PM  Clinical Narrative:                  Notified by nursing student that patient had been diagnosed with serious cancer diagnosis and asking about who he could talk to. Spoke with patient at the bedside. Stated that he will be staying with friends who are very supportive and can provide transportation to appointments. Discussed network of resources through the cancer center to assist him with process. Advised of outpatient counseling through [facility] as well - patient asked about telephonic appointments. He stated that he would like some quality time with his family to create more memories, since he wasn't always there for his kids when they were growing up. He talked about being a recovering alcoholic and attending [place] meetings. He believes in a higher power, and attended catholic school through 3rd grade. Discussed spiritual consult for chaplain visit over weekend, and patient agreed.   Expected Discharge Plan: Home/Self Care Barriers to Discharge: Continued Medical Work up   Patient Goals and CMS Choice   CMS Medicare.gov Compare Post Acute Care list provided to:: Patient Choice offered to / list presented to : NA  Expected Discharge Plan and Services Expected Discharge Plan: Home/Self Care In-house Referral: Chaplain, Clinical Social Work Discharge Planning Services: CM Consult Post Acute Care Choice: NA                   DME Arranged: N/A DME Agency: NA       HH Arranged: NA          Prior Living Arrangements/Services   Lives with:: Friends, Self Patient language and need for interpreter reviewed:: Yes        Need for Family Participation in Patient Care: Yes (Comment) Care giver support system in place?: Yes (comment)   Criminal  Activity/Legal Involvement Pertinent to Current Situation/Hospitalization: No - Comment as needed  Activities of Daily Living Home Assistive Devices/Equipment: None ADL Screening (condition at time of admission) Patient's cognitive ability adequate to safely complete daily activities?: Yes Is the patient deaf or have difficulty hearing?: No Does the patient have difficulty seeing, even when wearing glasses/contacts?: No Does the patient have difficulty concentrating, remembering, or making decisions?: No Patient able to express need for assistance with ADLs?: Yes Does the patient have difficulty dressing or bathing?: No Independently performs ADLs?: Yes (appropriate for developmental age) Does the patient have difficulty walking or climbing stairs?: No Weakness of Legs: None Weakness of Arms/Hands: None  Permission Sought/Granted                  Emotional Assessment Appearance:: Appears stated age Attitude/Demeanor/Rapport: Engaged Affect (typically observed): Accepting Orientation: : Oriented to Self, Oriented to  Time, Oriented to Place, Oriented to Situation Alcohol / Substance Use: Not Applicable Psych Involvement: No (comment)  Admission diagnosis:  Anemia [D64.9] Bone metastases (HCC) [C79.51] Symptomatic anemia [D64.9] Gastritis, presence of bleeding unspecified, unspecified chronicity, unspecified gastritis type [K29.70] Patient Active Problem List   Diagnosis Date Noted   Anemia 03/03/2020   Hypertriglyceridemia 02/27/2018   Cocaine abuse with cocaine-induced mood disorder ([place]) 04/03/2016   Depression    Idiopathic thrombocytopenic purpura ([place]) 01/14/2016   Polysubstance abuse ([place]) 01/14/2016   Benzodiazepine abuse ([place]) 01/05/2016  Major depressive disorder, recurrent episode, severe, with psychosis ([place]) 01/05/2016   Gout 07/06/2015   Hep C w/o coma, chronic ([place]) 07/06/2015   HTN (hypertension) 12/01/2014   Elevated blood uric acid level  12/01/2014   History of ETOH abuse 12/01/2014   PCP:  Patient, No Pcp Per Pharmacy:   [facility], [place] [address] [place] [address] Phone: [phone] Fax: [phone]  [facility] [place], [place] [place] [address] Phone: [phone] Fax: [phone]     Social Determinants of Health (SDOH) Interventions    Readmission Risk Interventions No flowsheet data found.

## 2020-03-04 NOTE — Progress Notes (Addendum)
HEMATOLOGY-ONCOLOGY PROGRESS NOTE  SUBJECTIVE: The patient is feeling better overall today.  Still with right rib pain.  Denies bleeding.  PHYSICAL EXAMINATION:  Vitals:   03/04/20 0009 03/04/20 0855  BP: 108/67 116/72  Pulse: 73 80  Resp: 18 16  Temp: 98.4 F (36.9 C) 97.9 F (36.6 C)  SpO2: 96%    Filed Weights   03/03/20 0936 03/03/20 1700 03/03/20 2110  Weight: 68 kg 67.3 kg 67.4 kg    Intake/Output from previous day: 11/18 0701 - 11/19 0700 In: 1410 [I.V.:315; Blood:945; IV Piggyback:150] Out: 300 [Urine:300]  GENERAL: Chronically ill-appearing male, no distress, sitting up in recliner SKIN: skin color, texture, turgor are normal, no rashes or significant lesions EYES: normal, Conjunctiva are pink and non-injected, sclera clear OROPHARYNX:no exudate, no erythema and lips, buccal mucosa, and tongue normal  LUNGS: clear to auscultation and percussion with normal breathing effort HEART: regular rate & rhythm and no murmurs and no lower extremity edema ABDOMEN:abdomen soft, non-tender and normal bowel sounds NEURO: alert & oriented x 3 with fluent speech, no focal motor/sensory deficits  LABORATORY DATA:  I have reviewed the data as listed CMP Latest Ref Rng & Units 03/04/2020 03/03/2020 12/19/2017  Glucose 70 - 99 mg/dL 117(H) 158(H) 136(H)  BUN 6 - 20 mg/dL 13 19 19   Creatinine 0.61 - 1.24 mg/dL 0.97 0.98 0.76  Sodium 135 - 145 mmol/L 135 136 144  Potassium 3.5 - 5.1 mmol/L 4.4 4.0 4.6  Chloride 98 - 111 mmol/L 105 103 104  CO2 22 - 32 mmol/L 21(L) 21(L) 22  Calcium 8.9 - 10.3 mg/dL 9.0 9.0 9.6  Total Protein 6.5 - 8.1 g/dL - 6.5 8.0  Total Bilirubin 0.3 - 1.2 mg/dL - 1.3(H) 0.8  Alkaline Phos 38 - 126 U/L - 148(H) 61  AST 15 - 41 U/L - 14(L) 80(H)  ALT 0 - 44 U/L - 8 174(H)    Lab Results  Component Value Date   WBC 6.0 03/04/2020   HGB 8.4 (L) 03/04/2020   HCT 26.3 (L) 03/04/2020   MCV 87.1 03/04/2020   PLT 101 (L) 03/04/2020   NEUTROABS 3.9 01/14/2016     DG Chest 2 View  Result Date: 03/03/2020 CLINICAL DATA:  Chest pain, shortness of breath EXAM: CHEST - 2 VIEW COMPARISON:  04/25/2012 FINDINGS: The heart size and mediastinal contours are within normal limits. Mildly coarsened interstitial markings bilaterally. No focal airspace consolidation, pleural effusion, or pneumothorax. Degenerative changes of the bilateral shoulders. IMPRESSION: Mildly coarsened interstitial markings bilaterally, which may reflect bronchitic type lung changes. No focal airspace consolidation. Electronically Signed   By: Davina Poke D.O.   On: 03/03/2020 10:04   MR CERVICAL SPINE W WO CONTRAST  Result Date: 03/04/2020 CLINICAL DATA:  Cancer staging.  Right cervical radiculopathy. a EXAM: MRI CERVICAL SPINE WITHOUT AND WITH CONTRAST TECHNIQUE: Multiplanar and multiecho pulse sequences of the cervical spine, to include the craniocervical junction and cervicothoracic junction, were obtained without and with intravenous contrast. CONTRAST:  3mL GADAVIST GADOBUTROL 1 MMOL/ML IV SOLN COMPARISON:  None. FINDINGS: Alignment: Normal Vertebrae: Infiltrative low marrow signal throughout the skull base and cervical spine with patchy increased enhancement especially at the C1 posterior arch, C2 body and spinous process, C4 body, C6 spinous process, and in the upper thoracic spine as described separately. Negative for pathologic fracture. There is asymmetric enhancing material in the right canal at the level of C5-6, which is likely epidural tumor extension in this patient with right radiculopathy. No associated  cord compression, question right C6 radiculopathy. Cord: Normal signal and morphology. No abnormal intrathecal enhancement Posterior Fossa, vertebral arteries, paraspinal tissues: Unremarkable appearance of the brain Disc levels: Disc degeneration primarily at C3-4 to C6-7 and greatest at C3-4 where there is a disc protrusion contacting the ventral cord and bilateral  uncovertebral spurring causing foraminal impingement. IMPRESSION: 1. Widespread osseous metastatic disease. 2. Suspect right-sided epidural tumor infiltration at C5-6. 3. No pathologic fracture. Electronically Signed   By: Monte Fantasia M.D.   On: 03/04/2020 05:58   MR THORACIC SPINE W WO CONTRAST  Result Date: 03/04/2020 CLINICAL DATA:  Cancer bone and a primary.  Neck pain. EXAM: MRI THORACIC WITHOUT AND WITH CONTRAST TECHNIQUE: Multiplanar and multiecho pulse sequences of the thoracic spine were obtained without and with intravenous contrast. CONTRAST:  24mL GADAVIST GADOBUTROL 1 MMOL/ML IV SOLN COMPARISON:  Chest CTA from yesterday FINDINGS: MRI THORACIC SPINE FINDINGS Alignment:  Physiologic Vertebrae: Confluent marrow infiltration throughout the spine with heterogeneous patchy enhancement on postcontrast imaging. Extensive medial rib infiltration is also present. No pathologic fracture. No measurable extraosseous tumor extension. No cord impingement Cord:  Normal signal and morphology. Paraspinal and other soft tissues: Negative Disc levels: No degenerative impingement. IMPRESSION: Confluent osseous metastatic disease in the thoracic spine without pathologic fracture or neural impingement. Electronically Signed   By: Monte Fantasia M.D.   On: 03/04/2020 05:31   MR Lumbar Spine W Wo Contrast  Result Date: 03/04/2020 CLINICAL DATA:  Cancer of unknown primary EXAM: MRI LUMBAR SPINE WITHOUT AND WITH CONTRAST TECHNIQUE: Multiplanar and multiecho pulse sequences of the lumbar spine were obtained without and with intravenous contrast. CONTRAST:  33mL GADAVIST GADOBUTROL 1 MMOL/ML IV SOLN COMPARISON:  None. FINDINGS: No available sagittal T2 weighted imaging. Segmentation:  5 lumbar type vertebrae Alignment:  Slight retrolisthesis at L5-S1 Vertebrae: Confluent osseous metastatic disease with diffuse hypoattenuating marrow and patchy enhancement affecting every level and the visualized pelvis. Confirmed  ventral epidural tumor infiltration at the level of S1, impinging on the right for descending S1 nerve root. There is also extraosseous tumor extension ventrally from the right sacroiliac joint region and ventrally from the left ilium. No pathologic fracture. Conus medullaris and cauda equina: Conus extends to the L1-2 level. Conus and cauda equina appear normal. Paraspinal and other soft tissues: Retroperitoneal adenopathy, known. Disc levels: Disc and facet degeneration in the lower lumbar spine with up to moderate right foraminal stenosis at L4-5. Moderate spinal stenosis at L4-5 and L5-S1. IMPRESSION: 1. Confluent metastatic disease in the lumbar spine and pelvis with ventral epidural tumor infiltration at S1 impinging on the right S1 nerve root. 2. Extraosseous tumor growth ventrally from the bilateral ilium. Electronically Signed   By: Monte Fantasia M.D.   On: 03/04/2020 05:56   CT Angio Chest/Abd/Pel for Dissection W and/or Wo Contrast  Result Date: 03/03/2020 CLINICAL DATA:  Abdominal pain with acute aortic dissection suspected EXAM: CT ANGIOGRAPHY CHEST, ABDOMEN AND PELVIS TECHNIQUE: Non-contrast CT of the chest was initially obtained. Multidetector CT imaging through the chest, abdomen and pelvis was performed using the standard protocol during bolus administration of intravenous contrast. Multiplanar reconstructed images and MIPs were obtained and reviewed to evaluate the vascular anatomy. CONTRAST:  149mL OMNIPAQUE IOHEXOL 350 MG/ML SOLN COMPARISON:  None. FINDINGS: CTA CHEST FINDINGS Cardiovascular: No intramural aortic hematoma on noncontrast phase. Normal heart size. No pericardial effusion. Mediastinum/Nodes: Negative for adenopathy or mass. Lungs/Pleura: Subpleural nodule in the anterior right chest which is related to a rib lesion. No  pulmonary nodule or consolidation Musculoskeletal: Diffuse heterogeneous bony density patchy areas of sclerosis and lucency. Pathologic anterior right third  rib fracture. 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 Review of the MIP images confirms the above findings. CTA ABDOMEN AND PELVIS FINDINGS VASCULAR Aorta: Atheromatous wall thickening.  No dissection or aneurysm Celiac: Patent without evidence of aneurysm, dissection, vasculitis or significant stenosis. SMA: Moderate atheromatous narrowing proximally. No acute finding including branch occlusion. Renals: Single bilateral renal arteries which are smooth and widely patent. IMA: Patent Inflow: Atheromatous plaque without flow limiting stenosis or dissection. Veins: Negative in the arterial phase Review of the MIP images confirms the above findings. NON-VASCULAR Hepatobiliary: Lobulated liver surface compatible with cirrhosis. There is known hepatitis C.no evidence of biliary obstruction or stone. Pancreas: Coarse calcification at the head, likely post inflammatory. No acute finding. Spleen: Generous size in the setting of presumed portal hypertension. Adrenals/Urinary Tract: Negative adrenals. No hydronephrosis or stone. Unremarkable bladder. Stomach/Bowel:  No obstruction. Mild distal colonic diverticulosis. Lymphatic: Lymphadenopathy in the pelvis and lower abdomen which is retroperitoneal. A rounded node left of the distal aorta measures 21 mm in short axis. A left external iliac node measures 2 cm in short axis on 6:161 Reproductive:Enlarged prostate with eccentric left nodularity at the bladder base. Other: No ascites or pneumoperitoneum. Musculoskeletal: Extensive sclerotic metastatic disease in the lumbar spine and pelvis with aggressive periosteal reaction along the bilateral iliac fossa. Multilevel lumbar spine degeneration with spinal stenosis at L3-4 and below. Tumor may infiltrate the right eccentric and ventral sacral spinal canal at the level of S1. No acute fracture. Review of the MIP images confirms the above findings. IMPRESSION: 1. Widespread osseous metastatic disease and  lower retroperitoneal adenopathy. Favor prostate primary. 2. Pathologic right anterior third rib fracture. 3. 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 4. Cirrhosis with splenomegaly. 5. No acute aortic finding. Electronically Signed   By: Monte Fantasia M.D.   On: 03/03/2020 11:56    ASSESSMENT AND PLAN: 1.  Metastatic prostate cancer - Lytic bone lesions with retroperitoneal adenopathy concerning for metastatic prostate cancer -03/03/2020-CTA chest/abdomen/pelvis widespread osseous metastatic disease and lower retroperitoneal adenopathy, pathologic right anterior third rib fracture, probable spinal canal tumor at the level of S1, -MRI cervical, thoracic, and lumbar spine 03/03/2020-diffuse osseous metastatic disease, ventral epidural tumor impinging on right S1 nerve root, extraosseous tumor at the bilateral ilium, asymmetric enhancing material at the right C5-6 canal-right  -03/03/2020-PSA 763 2.  Severe anemia-likely secondary to metastatic prostate cancer involving the bones 3.  Mild thrombocytopenia 4.  Cirrhosis with splenomegaly 5.  History of hepatitis C 6.  History of polysubstance abuse 7.  Depression 8.  Tobacco dependence 9.  Right arm weakness, right facial numbness-potentially related to nerve root compromise from metastatic bone lesions 10.  Pain secondary to #1  Mr. Staples appears improved.  Pain is overall better controlled today.  Again discussed the CT scan findings as well as PSA results.  We discussed findings consistent with metastatic prostate cancer.  Recommend starting treatment with Mills Koller today.  Adverse effects of been discussed with the patient and he agrees to proceed.  Status post 2 units PRBCs with improvement of hemoglobin of 8.4.  Anemia is likely related to the metastatic prostate cancer involving the bone/bone marrow.  Recommendations: 1.  Begin [place] today. 2.  The patient will be discharged home once pain is adequately controlled. 3.   Outpatient follow-up has been scheduled at the cancer center on 03/14/2020.  LOS: 1 day   Mikey Bussing, DNP, AGPCNP-BC, AOCNP 03/04/20 Mr. Kuzel was interviewed and examined.  The clinical presentation is consistent with a diagnosis of metastatic prostate cancer with widespread bone metastases.  I discussed the diagnosis and treatment options with Mr. Townley.  No therapy will be curative.  There is a high chance of clinical improvement with androgen deprivation therapy.  He reports chronic symptoms at the right neck.  The spine tumor should respond to antiestrogen therapy.  I will defer the decision on surgical intervention and radiation to the neurosurgeon.  I recommend beginning treatment with a GnRH antagonist.  We will start additional antiandrogen therapy, abiraterone/prednisone, as an outpatient.  We will also add biphosphonate therapy as an outpatient.  He can be discharged to home with an oral pain regimen and follow-up at the Cancer center within the next 2 weeks.  I reviewed potential toxicities associated with Mills Koller.  He agrees to proceed.  Please call oncology as needed.

## 2020-03-04 NOTE — Telephone Encounter (Signed)
Scheduled per 11/19 sch msg. Pt will receive an appt printout per hospital DC

## 2020-03-05 DIAGNOSIS — D649 Anemia, unspecified: Secondary | ICD-10-CM | POA: Diagnosis not present

## 2020-03-05 DIAGNOSIS — C61 Malignant neoplasm of prostate: Secondary | ICD-10-CM | POA: Diagnosis not present

## 2020-03-05 DIAGNOSIS — C7951 Secondary malignant neoplasm of bone: Secondary | ICD-10-CM | POA: Diagnosis not present

## 2020-03-05 MED ORDER — HYDROMORPHONE HCL 1 MG/ML IJ SOLN: 0.5000 mg | INTRAMUSCULAR | Status: DC | PRN

## 2020-03-05 MED ORDER — ACETAMINOPHEN 500 MG PO TABS
1000.0000 mg | ORAL_TABLET | Freq: Three times a day (TID) | ORAL | Status: DC
  Administered 2020-03-05 – 2020-03-06 (×3): 1000 mg via ORAL
  Filled 2020-03-05 (×5): qty 2

## 2020-03-05 MED ORDER — ALUM & MAG HYDROXIDE-SIMETH 200-200-20 MG/5ML PO SUSP
30.0000 mL | Freq: Four times a day (QID) | ORAL | Status: DC | PRN
  Administered 2020-03-05 – 2020-03-06 (×2): 30 mL via ORAL
  Filled 2020-03-05 (×2): qty 30

## 2020-03-05 MED ORDER — OXYCODONE HCL 5 MG PO TABS
10.0000 mg | ORAL_TABLET | ORAL | Status: DC | PRN
  Administered 2020-03-05: 10 mg via ORAL
  Administered 2020-03-05 – 2020-03-06 (×3): 15 mg via ORAL
  Filled 2020-03-05 (×3): qty 3
  Filled 2020-03-05: qty 2

## 2020-03-05 MED ORDER — DOCUSATE SODIUM 100 MG PO CAPS
100.0000 mg | ORAL_CAPSULE | Freq: Every day | ORAL | Status: DC
  Administered 2020-03-05 – 2020-03-08 (×5): 100 mg via ORAL
  Filled 2020-03-05 (×5): qty 1

## 2020-03-05 MED ORDER — HYDROMORPHONE HCL 1 MG/ML IJ SOLN
0.5000 mg | INTRAMUSCULAR | Status: DC | PRN
  Administered 2020-03-05 – 2020-03-06 (×5): 0.5 mg via INTRAVENOUS
  Filled 2020-03-05 (×5): qty 1

## 2020-03-05 NOTE — Progress Notes (Signed)
Progress Note    Edgar Mooney  VVO:[id number] DOB: 1965-11-17  DOA: 03/03/2020 PCP: Patient, No Pcp Per    Brief Narrative:    Medical records reviewed and are as summarized below:  Edgar Mooney is an 54 y.o. male admitted with anemia and metastatic prostate cancer.  Will get outpatient treatment from Oncology for cancer.  Pain control prior to d/c.    Assessment/Plan:   Active Problems:   Anemia   Symptomatic anemia -no need for EGD -s/p PRBCs with improvement  Mets to lumbar and sacral vertebral   secondary to prostate cancer -seen by hematology: [name] was interviewed and examined.  The clinical presentation is consistent with a diagnosis of metastatic prostate cancer with widespread bone metastases.  I discussed the diagnosis and treatment options with [name].  No therapy will be curative.  There is a high chance of clinical improvement with androgen deprivation therapy. He reports chronic symptoms at the right neck.  The spine tumor should respond to antiestrogen therapy.  I will defer the decision on surgical intervention and radiation to the neurosurgeon. I recommend beginning treatment with a GnRH antagonist.  We will start additional antiandrogen therapy, abiraterone/prednisone, as an outpatient.  We will also add biphosphonate therapy as an outpatient. He can be discharged to home with an oral pain regimen and follow-up at the Cancer center within the next 2 weeks. -transition from IV pain meds to PO -long discussion with patient that goal is not to be pain free.  Will schedule tylenol and increase oxy to 10-15.  May need pain patch like fentanyl. -bowel regimen  Urinary retention, acute on chronic -Start Flomax  Chronic hep C status post antiviral treatment 2 years ago  HTN -Hold BP meds for now.  Tobacco abuse -encourage cessation   Family Communication/Anticipated D/C date and plan/Code Status   DVT prophylaxis: Lovenox ordered. Code  Status: Full Code.  Disposition Plan: Status is: Inpatient  Remains inpatient appropriate because:IV treatments appropriate due to intensity of illness or inability to take PO   Dispo: The patient is from: Home              Anticipated d/c is to: Home              Anticipated d/c date is: 1 day              Patient currently is not medically stable to d/c. can d/c once pain contolled         Medical Consultants:    GI  Heme onc  Subjective:   Sleeping but when awoken, says pain is uncontrolled.  Objective:    Vitals:   03/04/20 1646 03/04/20 2110 03/05/20 0649 03/05/20 0937  BP: 104/72 106/69 108/70 109/69  Pulse: 77 78 70 76  Resp: 15 17 18 16   Temp: 97.9 F (36.6 C) 98.4 F (36.9 C) 98 F (36.7 C) 98.7 F (37.1 C)  TempSrc: Oral  Oral Oral  SpO2: 97% 94% 98% 96%  Weight:  67.4 kg    Height:        Intake/Output Summary (Last 24 hours) at 03/05/2020 1215 Last data filed at 03/05/2020 0200 Gross per 24 hour  Intake 240 ml  Output 200 ml  Net 40 ml   Filed Weights   03/03/20 1700 03/03/20 2110 03/04/20 2110  Weight: 67.3 kg 67.4 kg 67.4 kg    Exam:   General: Appearance:     Thin male in no  acute distress     Lungs:     respirations unlabored  Heart:    Normal heart rate. Normal rhythm. No murmurs, rubs, or gallops.   MS:   All extremities are intact.   Neurologic:   Awake, alert, oriented x 3. No apparent focal neurological           defect.                        Data Reviewed:   I have personally reviewed following labs and imaging studies:  Labs: Labs show the following:   Basic Metabolic Panel: Recent Labs  Lab 03/03/20 0948 03/04/20 0500  NA 136 135  K 4.0 4.4  CL 103 105  CO2 21* 21*  GLUCOSE 158* 117*  BUN 19 13  CREATININE 0.98 0.97  CALCIUM 9.0 9.0   GFR Estimated Creatinine Clearance: 73.5 mL/min (by C-G formula based on SCr of 0.97 mg/dL). Liver Function Tests: Recent Labs  Lab 03/03/20 0948  AST 14*   ALT 8  ALKPHOS 148*  BILITOT 1.3*  PROT 6.5  ALBUMIN 2.9*   No results for input(s): LIPASE, AMYLASE in the last 168 hours. No results for input(s): AMMONIA in the last 168 hours. Coagulation profile Recent Labs  Lab 03/03/20 2335  INR 1.4*    CBC: Recent Labs  Lab 03/03/20 0948 03/03/20 2335 03/04/20 0500  WBC 5.9  --  6.0  HGB 5.8* 7.8* 8.4*  HCT 19.8* 24.1* 26.3*  MCV 93.4  --  87.1  PLT 110*  --  101*   Cardiac Enzymes: No results for input(s): CKTOTAL, CKMB, CKMBINDEX, TROPONINI in the last 168 hours. BNP (last 3 results) No results for input(s): PROBNP in the last 8760 hours. CBG: No results for input(s): GLUCAP in the last 168 hours. D-Dimer: No results for input(s): DDIMER in the last 72 hours. Hgb A1c: No results for input(s): HGBA1C in the last 72 hours. Lipid Profile: No results for input(s): CHOL, HDL, LDLCALC, TRIG, CHOLHDL, LDLDIRECT in the last 72 hours. Thyroid function studies: No results for input(s): TSH, T4TOTAL, T3FREE, THYROIDAB in the last 72 hours.  Invalid input(s): FREET3 Anemia work up: Recent Labs    03/03/20 1121 03/03/20 2335  FERRITIN 1,319*  --   TIBC 294  --   IRON 14*  --   RETICCTPCT  --  4.7*   Sepsis Labs: Recent Labs  Lab 03/03/20 0948 03/04/20 0500  WBC 5.9 6.0    Microbiology Recent Results (from the past 240 hour(s))  Respiratory Panel by RT PCR (Flu A&B, Covid) - Nasopharyngeal Swab     Status: None   Collection Time: 03/03/20 11:20 AM   Specimen: Nasopharyngeal Swab; Nasopharyngeal(NP) swabs in vial transport medium  Result Value Ref Range Status   SARS Coronavirus 2 by RT PCR NEGATIVE NEGATIVE Final    Comment: (NOTE) SARS-CoV-2 target nucleic acids are NOT DETECTED.  The SARS-CoV-2 RNA is generally detectable in upper respiratoy specimens during the acute phase of infection. The lowest concentration of SARS-CoV-2 viral copies this assay can detect is 131 copies/mL. A negative result does not  preclude SARS-Cov-2 infection and should not be used as the sole basis for treatment or other patient management decisions. A negative result may occur with  improper specimen collection/handling, submission of specimen other than nasopharyngeal swab, presence of viral mutation(s) within the areas targeted by this assay, and inadequate number of viral copies (<131 copies/mL). A negative result must be  combined with clinical observations, patient history, and epidemiological information. The expected result is Negative.  Fact Sheet for Patients:  PinkCheek.be  Fact Sheet for Healthcare Providers:  GravelBags.it  This test is no t yet approved or cleared by the [place] FDA and  has been authorized for detection and/or diagnosis of SARS-CoV-2 by FDA under an Emergency Use Authorization (EUA). This EUA will remain  in effect (meaning this test can be used) for the duration of the COVID-19 declaration under Section 564(b)(1) of the Act, 21 U.S.C. section 360bbb-3(b)(1), unless the authorization is terminated or revoked sooner.     Influenza A by PCR NEGATIVE NEGATIVE Final   Influenza B by PCR NEGATIVE NEGATIVE Final    Comment: (NOTE) The Xpert Xpress SARS-CoV-2/FLU/RSV assay is intended as an aid in  the diagnosis of influenza from Nasopharyngeal swab specimens and  should not be used as a sole basis for treatment. Nasal washings and  aspirates are unacceptable for Xpert Xpress SARS-CoV-2/FLU/RSV  testing.  Fact Sheet for Patients: PinkCheek.be  Fact Sheet for Healthcare Providers: GravelBags.it  This test is not yet approved or cleared by the [place] FDA and  has been authorized for detection and/or diagnosis of SARS-CoV-2 by  FDA under an Emergency Use Authorization (EUA). This EUA will remain  in effect (meaning this test can be used) for the  duration of the  Covid-19 declaration under Section 564(b)(1) of the Act, 21  U.S.C. section 360bbb-3(b)(1), unless the authorization is  terminated or revoked. Performed at [facility], [place] [address] [place], [place] [address]     Procedures and diagnostic studies:  MR CERVICAL SPINE W WO CONTRAST  Result Date: 03/04/2020 CLINICAL DATA:  Cancer staging.  Right cervical radiculopathy. a EXAM: MRI CERVICAL SPINE WITHOUT AND WITH CONTRAST TECHNIQUE: Multiplanar and multiecho pulse sequences of the cervical spine, to include the craniocervical junction and cervicothoracic junction, were obtained without and with intravenous contrast. CONTRAST:  36mL GADAVIST GADOBUTROL 1 MMOL/ML IV SOLN COMPARISON:  None. FINDINGS: Alignment: Normal Vertebrae: Infiltrative low marrow signal throughout the skull base and cervical spine with patchy increased enhancement especially at the C1 posterior arch, C2 body and spinous process, C4 body, C6 spinous process, and in the upper thoracic spine as described separately. Negative for pathologic fracture. There is asymmetric enhancing material in the right canal at the level of C5-6, which is likely epidural tumor extension in this patient with right radiculopathy. No associated cord compression, question right C6 radiculopathy. Cord: Normal signal and morphology. No abnormal intrathecal enhancement Posterior Fossa, vertebral arteries, paraspinal tissues: Unremarkable appearance of the brain Disc levels: Disc degeneration primarily at C3-4 to C6-7 and greatest at C3-4 where there is a disc protrusion contacting the ventral cord and bilateral uncovertebral spurring causing foraminal impingement. IMPRESSION: 1. Widespread osseous metastatic disease. 2. Suspect right-sided epidural tumor infiltration at C5-6. 3. No pathologic fracture. Electronically Signed   By: Monte Fantasia M.D.   On: 03/04/2020 05:58   MR THORACIC SPINE W WO CONTRAST  Result Date:  03/04/2020 CLINICAL DATA:  Cancer bone and a primary.  Neck pain. EXAM: MRI THORACIC WITHOUT AND WITH CONTRAST TECHNIQUE: Multiplanar and multiecho pulse sequences of the thoracic spine were obtained without and with intravenous contrast. CONTRAST:  82mL GADAVIST GADOBUTROL 1 MMOL/ML IV SOLN COMPARISON:  Chest CTA from yesterday FINDINGS: MRI THORACIC SPINE FINDINGS Alignment:  Physiologic Vertebrae: Confluent marrow infiltration throughout the spine with heterogeneous patchy enhancement on postcontrast imaging. Extensive medial rib infiltration is  also present. No pathologic fracture. No measurable extraosseous tumor extension. No cord impingement Cord:  Normal signal and morphology. Paraspinal and other soft tissues: Negative Disc levels: No degenerative impingement. IMPRESSION: Confluent osseous metastatic disease in the thoracic spine without pathologic fracture or neural impingement. Electronically Signed   By: Monte Fantasia M.D.   On: 03/04/2020 05:31   MR Lumbar Spine W Wo Contrast  Result Date: 03/04/2020 CLINICAL DATA:  Cancer of unknown primary EXAM: MRI LUMBAR SPINE WITHOUT AND WITH CONTRAST TECHNIQUE: Multiplanar and multiecho pulse sequences of the lumbar spine were obtained without and with intravenous contrast. CONTRAST:  9mL GADAVIST GADOBUTROL 1 MMOL/ML IV SOLN COMPARISON:  None. FINDINGS: No available sagittal T2 weighted imaging. Segmentation:  5 lumbar type vertebrae Alignment:  Slight retrolisthesis at L5-S1 Vertebrae: Confluent osseous metastatic disease with diffuse hypoattenuating marrow and patchy enhancement affecting every level and the visualized pelvis. Confirmed ventral epidural tumor infiltration at the level of S1, impinging on the right for descending S1 nerve root. There is also extraosseous tumor extension ventrally from the right sacroiliac joint region and ventrally from the left ilium. No pathologic fracture. Conus medullaris and cauda equina: Conus extends to the L1-2  level. Conus and cauda equina appear normal. Paraspinal and other soft tissues: Retroperitoneal adenopathy, known. Disc levels: Disc and facet degeneration in the lower lumbar spine with up to moderate right foraminal stenosis at L4-5. Moderate spinal stenosis at L4-5 and L5-S1. IMPRESSION: 1. Confluent metastatic disease in the lumbar spine and pelvis with ventral epidural tumor infiltration at S1 impinging on the right S1 nerve root. 2. Extraosseous tumor growth ventrally from the bilateral ilium. Electronically Signed   By: Monte Fantasia M.D.   On: 03/04/2020 05:56    Medications:   . acetaminophen  1,000 mg Oral TID  . docusate sodium  100 mg Oral Daily  . ferrous gluconate  324 mg Oral BID WC  . LORazepam  0.5 mg Intravenous Once  . nicotine  14 mg Transdermal Daily  . tamsulosin  0.4 mg Oral QPC supper   Continuous Infusions: . sodium chloride    . sodium chloride Stopped (03/04/20 0700)     LOS: 2 days   Geradine Girt  [facility]   How to contact the [facility] Attending or Consulting provider [place] or covering provider during after hours [place], for this patient?  1. Check the care team in [facility] and look for a) attending/consulting [facility] provider listed and b) the [facility] team listed 2. Log into [email or web address] and use [place]'s universal password to access. If you do not have the password, please contact the hospital operator. 3. Locate the [facility] provider you are looking for under [facility] and page to a number that you can be directly reached. 4. If you still have difficulty reaching the provider, please page the [facility] (Director on Call) for the Hospitalists listed on amion for assistance.  03/05/2020, 12:15 PM

## 2020-03-05 NOTE — Progress Notes (Signed)
Message sent to Dr Myna Hidalgo, referred pt gastric pain, with new order

## 2020-03-06 DIAGNOSIS — R338 Other retention of urine: Secondary | ICD-10-CM | POA: Diagnosis not present

## 2020-03-06 DIAGNOSIS — Z87891 Personal history of nicotine dependence: Secondary | ICD-10-CM

## 2020-03-06 DIAGNOSIS — M898X9 Other specified disorders of bone, unspecified site: Secondary | ICD-10-CM | POA: Diagnosis not present

## 2020-03-06 DIAGNOSIS — D509 Iron deficiency anemia, unspecified: Secondary | ICD-10-CM | POA: Diagnosis not present

## 2020-03-06 DIAGNOSIS — I10 Essential (primary) hypertension: Secondary | ICD-10-CM

## 2020-03-06 DIAGNOSIS — C61 Malignant neoplasm of prostate: Secondary | ICD-10-CM | POA: Diagnosis not present

## 2020-03-06 MED ORDER — OXYCODONE-ACETAMINOPHEN 5-325 MG PO TABS
1.0000 | ORAL_TABLET | Freq: Four times a day (QID) | ORAL | Status: DC | PRN
  Administered 2020-03-06 – 2020-03-08 (×6): 2 via ORAL
  Filled 2020-03-06 (×6): qty 2

## 2020-03-06 MED ORDER — LIDOCAINE 5 % EX PTCH
1.0000 | MEDICATED_PATCH | CUTANEOUS | Status: DC
  Administered 2020-03-06 – 2020-03-07 (×2): 1 via TRANSDERMAL
  Filled 2020-03-06 (×2): qty 1

## 2020-03-06 MED ORDER — OXYCODONE HCL ER 10 MG PO T12A
10.0000 mg | EXTENDED_RELEASE_TABLET | Freq: Two times a day (BID) | ORAL | Status: DC
  Administered 2020-03-06 – 2020-03-08 (×5): 10 mg via ORAL
  Filled 2020-03-06 (×5): qty 1

## 2020-03-06 NOTE — Progress Notes (Addendum)
PROGRESS NOTE    Edgar Mooney  EGB:[id number]  DOB: 13-May-1965  PCP: Patient, No Pcp Per Admit date:03/03/2020 Chief compliant: Weakness and body pains  54 y.o. male with medical history significant of chronic Hep C s/p treatment, Cirrhosis, gout, non-compliant with medications, presented with worsening of pain on the neck, back and hip and worsening of generalized weakness. Symptoms started 3 months ago, only with neck pain, aching like then gradually developed shooting pain down to the right arm all the way to the 5 fingers and has been persistent. Then gradually, he developed low back pain and bilateral hip pain, asymptomatic, worse at night, for all of these above-mentioned pains he has been taking both from 800 mg 3-4 times a day for the last month. He also reported weight loss of more than 50 pounds compared to last year. For last month, he has been having trouble urinate, his stream is extremely weak, and he has to push to urinate ED Course: Hemoglobin 5.8, BUN 19, creatinine 0.9 CT abdomen pelvis showed possible right osseous metastatic disease and lower retroperitoneal adenopathy, suspicious for prostate CA metastatic.  Hospital course: Patient admitted to [facility] for further evaluation and management, specifically anemia work-up and pain control.  Patient received 2 units of PRBC during hospital course and evaluated by GI.  Evaluated by neurosurgery on-call who not cauda equina syndrome and by oncology for metastatic cancer.  Started on Flomax.   Subjective:  Patient been receiving multimodal pain therapy with high-dose oxycodone IR (10 to 20 mg) along with scheduled Tylenol, IV Dilaudid as needed but states pain relief does not last long.  He states anterior chest wall/rib pain most severe at 10/10 which is causing him to have shallow breathing.  He is able to ambulate in the room and saturating well on room air   Objective: Vitals:   03/06/20 0329 03/06/20 1010 03/06/20 1058 03/06/20  1445  BP: 110/67 107/65    Pulse: 77 72    Resp: 16 14 14 15   Temp: 99.1 F (37.3 C) 98.2 F (36.8 C)    TempSrc: Oral Oral    SpO2: 97% 100%    Weight: 71.1 kg     Height:        Intake/Output Summary (Last 24 hours) at 03/06/2020 1546 Last data filed at 03/06/2020 1430 Gross per 24 hour  Intake 1100 ml  Output --  Net 1100 ml   Filed Weights   03/03/20 2110 03/04/20 2110 03/06/20 0329  Weight: 67.4 kg 67.4 kg 71.1 kg    Physical Examination:  General: Thin built, no acute distress noted Head ENT: Atraumatic normocephalic, PERRLA, neck supple Heart: Point tenderness along third rib/costochondral junction, S1-S2 heard, regular rate and rhythm, no murmurs.  No leg edema noted Lungs: Equal air entry bilaterally, no rhonchi or rales on exam, no accessory muscle use Abdomen: Bowel sounds heard, soft, nontender, nondistended. No organomegaly.  No CVA tenderness Extremities: No pedal edema.  No cyanosis or clubbing. Neurological: Awake alert oriented x3, no focal weakness or numbness, strength and sensations to crude touch intact Skin: Ecchymosis/small subcutaneous hematoma at the site of Lovenox injections     Data Reviewed: I have personally reviewed following labs and imaging studies  CBC: Recent Labs  Lab 03/03/20 0948 03/03/20 2335 03/04/20 0500  WBC 5.9  --  6.0  HGB 5.8* 7.8* 8.4*  HCT 19.8* 24.1* 26.3*  MCV 93.4  --  87.1  PLT 110*  --  371*   Basic Metabolic  Panel: Recent Labs  Lab 03/03/20 0948 03/04/20 0500  NA 136 135  K 4.0 4.4  CL 103 105  CO2 21* 21*  GLUCOSE 158* 117*  BUN 19 13  CREATININE 0.98 0.97  CALCIUM 9.0 9.0   GFR: Estimated Creatinine Clearance: 75.4 mL/min (by C-G formula based on SCr of 0.97 mg/dL). Liver Function Tests: Recent Labs  Lab 03/03/20 0948  AST 14*  ALT 8  ALKPHOS 148*  BILITOT 1.3*  PROT 6.5  ALBUMIN 2.9*   No results for input(s): LIPASE, AMYLASE in the last 168 hours. No results for input(s): AMMONIA  in the last 168 hours. Coagulation Profile: Recent Labs  Lab 03/03/20 2335  INR 1.4*   Cardiac Enzymes: No results for input(s): CKTOTAL, CKMB, CKMBINDEX, TROPONINI in the last 168 hours. BNP (last 3 results) No results for input(s): PROBNP in the last 8760 hours. HbA1C: No results for input(s): HGBA1C in the last 72 hours. CBG: No results for input(s): GLUCAP in the last 168 hours. Lipid Profile: No results for input(s): CHOL, HDL, LDLCALC, TRIG, CHOLHDL, LDLDIRECT in the last 72 hours. Thyroid Function Tests: No results for input(s): TSH, T4TOTAL, FREET4, T3FREE, THYROIDAB in the last 72 hours. Anemia Panel: Recent Labs    03/03/20 2335  RETICCTPCT 4.7*   Sepsis Labs: No results for input(s): PROCALCITON, LATICACIDVEN in the last 168 hours.  Recent Results (from the past 240 hour(s))  Respiratory Panel by RT PCR (Flu A&B, Covid) - Nasopharyngeal Swab     Status: None   Collection Time: 03/03/20 11:20 AM   Specimen: Nasopharyngeal Swab; Nasopharyngeal(NP) swabs in vial transport medium  Result Value Ref Range Status   SARS Coronavirus 2 by RT PCR NEGATIVE NEGATIVE Final    Comment: (NOTE) SARS-CoV-2 target nucleic acids are NOT DETECTED.  The SARS-CoV-2 RNA is generally detectable in upper respiratoy specimens during the acute phase of infection. The lowest concentration of SARS-CoV-2 viral copies this assay can detect is 131 copies/mL. A negative result does not preclude SARS-Cov-2 infection and should not be used as the sole basis for treatment or other patient management decisions. A negative result may occur with  improper specimen collection/handling, submission of specimen other than nasopharyngeal swab, presence of viral mutation(s) within the areas targeted by this assay, and inadequate number of viral copies (<131 copies/mL). A negative result must be combined with clinical observations, patient history, and epidemiological information. The expected result is  Negative.  Fact Sheet for Patients:  PinkCheek.be  Fact Sheet for Healthcare Providers:  GravelBags.it  This test is no t yet approved or cleared by the [place] FDA and  has been authorized for detection and/or diagnosis of SARS-CoV-2 by FDA under an Emergency Use Authorization (EUA). This EUA will remain  in effect (meaning this test can be used) for the duration of the COVID-19 declaration under Section 564(b)(1) of the Act, 21 U.S.C. section 360bbb-3(b)(1), unless the authorization is terminated or revoked sooner.     Influenza A by PCR NEGATIVE NEGATIVE Final   Influenza B by PCR NEGATIVE NEGATIVE Final    Comment: (NOTE) The Xpert Xpress SARS-CoV-2/FLU/RSV assay is intended as an aid in  the diagnosis of influenza from Nasopharyngeal swab specimens and  should not be used as a sole basis for treatment. Nasal washings and  aspirates are unacceptable for Xpert Xpress SARS-CoV-2/FLU/RSV  testing.  Fact Sheet for Patients: PinkCheek.be  Fact Sheet for Healthcare Providers: GravelBags.it  This test is not yet approved or cleared by the [place]  States FDA and  has been authorized for detection and/or diagnosis of SARS-CoV-2 by  FDA under an Emergency Use Authorization (EUA). This EUA will remain  in effect (meaning this test can be used) for the duration of the  Covid-19 declaration under Section 564(b)(1) of the Act, 21  U.S.C. section 360bbb-3(b)(1), unless the authorization is  terminated or revoked. Performed at [facility], [place] [address], [place], [place] [address]       Radiology Studies: No results found.    Scheduled Meds: . docusate sodium  100 mg Oral Daily  . ferrous gluconate  324 mg Oral BID WC  . lidocaine  1 patch Transdermal Q24H  . LORazepam  0.5 mg Intravenous Once  . nicotine  14 mg Transdermal Daily  . oxyCODONE  10  mg Oral Q12H  . tamsulosin  0.4 mg Oral QPC supper   Continuous Infusions:   Assessment/Plan:  1.  Prostate cancer with bony mets: Continue multimodal pain therapy.  Will change short-acting oxycodone to long-acting OxyContin and Percocet for breakthrough pain.  Goal is pain control with oral medications prior to discharge.  Given rib pain and point tenderness, lidocaine patch prescribed.  Will monitor response.  On tapered dose of IV Dilaudid (required 2 doses so far today) seen by oncology, initiated on hormone therapy, they will follow-up as outpatient in 2 weeks.  On stool softeners and laxatives as needed in the setting of opiate use.  2.  Urinary retention: In the setting of problem #1.  Now on Flomax.  Not on Foley catheter.  3.  Symptomatic anemia: Again likely related to problem #1.  S/p 2 unit PRBC and hemoglobin stable.  No signs of acute bleeding.  Started on iron supplements.  Lovenox discontinued given complaints of pain and ecchymosis noted along abdominal wall injection sites.  4.  Chronic hepatitis C: S/p antiviral treatment 2 years ago.  5.  Hypertension: Now controlled without medications likely in the setting of recent weight loss.  6.  Tobacco use: Counseled regarding cessation.  DVT prophylaxis: Held Lovenox, ambulating Code Status: Full code Family / Patient Communication: Discussed with patient Disposition Plan:   Status is: Inpatient  Remains inpatient appropriate because:Ongoing active pain requiring inpatient pain management   Dispo: The patient is from: Home              Anticipated d/c is to: Home              Anticipated d/c date is: 1 day if pain management/regimen optimized              Patient currently is not medically stable to d/c.           Time spent: 25 MINS     >50% time spent in discussions with care team and coordination of care.    Guilford Shi, MD [facility] Pager in [place]  If 7PM-7AM, please contact  night-coverage [email or web address] 03/06/2020, 3:46 PM

## 2020-03-06 NOTE — Progress Notes (Addendum)
Pt still complaining of pain from stomach to chest,previously given Maalox at 0402H, explanation was given to wait for the effect of medicine both dilaudid and Maalox but insisting to take acetaminophen- given earlier  Must due at 1000 H, last dose was not given

## 2020-03-07 DIAGNOSIS — D509 Iron deficiency anemia, unspecified: Secondary | ICD-10-CM | POA: Diagnosis not present

## 2020-03-07 DIAGNOSIS — F3289 Other specified depressive episodes: Secondary | ICD-10-CM | POA: Diagnosis not present

## 2020-03-07 DIAGNOSIS — F1011 Alcohol abuse, in remission: Secondary | ICD-10-CM

## 2020-03-07 DIAGNOSIS — C61 Malignant neoplasm of prostate: Secondary | ICD-10-CM

## 2020-03-07 DIAGNOSIS — M109 Gout, unspecified: Secondary | ICD-10-CM

## 2020-03-07 DIAGNOSIS — F1414 Cocaine abuse with cocaine-induced mood disorder: Secondary | ICD-10-CM

## 2020-03-07 DIAGNOSIS — B182 Chronic viral hepatitis C: Secondary | ICD-10-CM

## 2020-03-07 DIAGNOSIS — C7951 Secondary malignant neoplasm of bone: Secondary | ICD-10-CM

## 2020-03-07 LAB — HEMOGLOBIN AND HEMATOCRIT, BLOOD
HCT: 21.8 % — ABNORMAL LOW (ref 39.0–52.0)
Hemoglobin: 6.8 g/dL — CL (ref 13.0–17.0)

## 2020-03-07 LAB — PREPARE RBC (CROSSMATCH)

## 2020-03-07 MED ORDER — OXYCODONE-ACETAMINOPHEN 5-325 MG PO TABS
1.0000 | ORAL_TABLET | Freq: Four times a day (QID) | ORAL | 0 refills | Status: DC | PRN
Start: 2020-03-07 — End: 2020-03-14

## 2020-03-07 MED ORDER — OXYCODONE HCL ER 10 MG PO T12A: 10.0000 mg | EXTENDED_RELEASE_TABLET | Freq: Two times a day (BID) | ORAL | 0 refills | Status: AC

## 2020-03-07 MED ORDER — TAMSULOSIN HCL 0.4 MG PO CAPS
0.4000 mg | ORAL_CAPSULE | Freq: Every day | ORAL | 1 refills | Status: DC
Start: 2020-03-07 — End: 2020-05-13

## 2020-03-07 MED ORDER — DOCUSATE SODIUM 100 MG PO CAPS
100.0000 mg | ORAL_CAPSULE | Freq: Every day | ORAL | 0 refills | Status: DC
Start: 2020-03-08 — End: 2020-04-01

## 2020-03-07 MED ORDER — POLYETHYLENE GLYCOL 3350 17 G PO PACK
17.0000 g | PACK | Freq: Every day | ORAL | 0 refills | Status: DC
Start: 2020-03-08 — End: 2020-04-01

## 2020-03-07 MED ORDER — NICOTINE 14 MG/24HR TD PT24: 14.0000 mg | MEDICATED_PATCH | Freq: Every day | TRANSDERMAL | 0 refills | Status: DC

## 2020-03-07 MED ORDER — LIDOCAINE 5 % EX PTCH
1.0000 | MEDICATED_PATCH | CUTANEOUS | 0 refills | Status: DC
Start: 2020-03-07 — End: 2020-04-01

## 2020-03-07 MED ORDER — POLYETHYLENE GLYCOL 3350 17 G PO PACK
17.0000 g | PACK | Freq: Every day | ORAL | Status: DC
  Administered 2020-03-07: 17 g via ORAL
  Filled 2020-03-07 (×2): qty 1

## 2020-03-07 MED ORDER — SODIUM CHLORIDE 0.9% IV SOLUTION: Freq: Once | INTRAVENOUS | Status: AC

## 2020-03-07 MED ORDER — ONDANSETRON HCL 4 MG PO TABS: 4.0000 mg | ORAL_TABLET | Freq: Four times a day (QID) | ORAL | 0 refills | Status: DC | PRN

## 2020-03-07 MED ORDER — ALUM & MAG HYDROXIDE-SIMETH 200-200-20 MG/5ML PO SUSP: 30.0000 mL | Freq: Four times a day (QID) | ORAL | 0 refills | Status: DC | PRN

## 2020-03-07 MED ORDER — FERROUS GLUCONATE 324 (38 FE) MG PO TABS
324.0000 mg | ORAL_TABLET | Freq: Two times a day (BID) | ORAL | 3 refills | Status: DC
Start: 2020-03-07 — End: 2021-08-03

## 2020-03-07 NOTE — Discharge Summary (Addendum)
Physician Discharge Summary  Edgar Mooney SWF:[id number] DOB: [birth year] DOA: 03/03/2020  PCP: Patient, No Pcp Per  Admit date: 03/03/2020 Discharge date: 03/07/2020 Consultations: Hematology Oncology, gastroenterology Admitted From: home Disposition: home  Discharge Diagnoses:  Principal Problem:   Anemia Active Problems:   Malignant neoplasm of prostate metastatic to bone (HCC)   HTN (hypertension)   History of ETOH abuse   Gout   Hep C w/o coma, chronic (HCC)   Idiopathic thrombocytopenic purpura ([place])   Depression   Cocaine abuse with cocaine-induced mood disorder [facility])   Hospital Course Summary:  Chief compliant: Weakness and body pains 54 y.o.malewith medical history significant ofchronic Hep C s/p treatment, Cirrhosis,gout,non-compliant with medications,presented with worsening of pain on the neck,back and hip and worsening of generalized weakness. Symptoms started 3 months ago, only with neck pain, aching like then gradually developed shooting pain down to the right arm all the way to the 5 fingers and has been persistent. Then gradually, he developed low back pain and bilateral hip pain, asymptomatic, worse at night,for all of these above-mentioned pains he has been taking both from 800 mg 3-4 times a day for the last month. He also reported weight loss of more than 50 pounds compared to last year. For last month, he has been having trouble urinate, his stream is extremely weak, and he has to push to urinate ED Course: Hemoglobin 5.8, BUN 19, creatinine 0.9 CT abdomen pelvis showed possible right osseous metastatic disease and lower retroperitoneal adenopathy, suspicious for prostate CA metastatic. Hospital course: Patient admitted to [facility] for further evaluation and management, specifically anemia work-up and pain control.  Patient received 2 units of PRBC during hospital course and evaluated by GI.  Evaluated by neurosurgery on-call who not cauda equina syndrome and by  oncology for metastatic cancer.  Started on Flomax.  1.  Symptomatic anemia: Likely related to problem #2. S/p 2 unit PRBC on admission hemoglobin improved to 8.4.  No signs of acute bleeding.  Started on iron supplements.  Lovenox discontinued given complaints of pain and ecchymosis noted along abdominal wall injection sites.  Repeat hemoglobin check earlier this morning was 6.8 and patient being transfused another unit prior to discharge.  2. Prostate cancer with bony mets: Patient managed with multimodal pain therapy while here with titration for pain control.Marland Kitchen  Ultimately changed short-acting oxycodone to long-acting OxyContin and Percocet for breakthrough pain which is working well.  Goal was pain control with oral medications prior to discharge.  Given rib pain and point tenderness, lidocaine patch prescribed which is also helping patient.  Discontinued IV Dilaudid (required only 1 dose last night).  Seen by oncology, initiated on hormone therapy, they will follow-up as outpatient in 2 weeks.  On stool softeners and laxatives as needed in the setting of opiate use.  3.  Urinary retention: In the setting of problem #1.  Now on Flomax.  Not on Foley catheter.  Denies any retention symptoms.  4.  Chronic hepatitis C: S/p antiviral treatment 2 years ago.  5.  Hypertension: Now controlled without medications likely in the setting of recent weight loss.  6.  Tobacco use: Counseled regarding cessation.   Discharge Exam:   Vitals:   03/06/20 1058 03/06/20 1445 03/06/20 2100 03/07/20 0950  BP:   110/69 (!) 93/50  Pulse:   78 74  Resp: 14 15 15 18   Temp:   99.5 F (37.5 C) 98.7 F (37.1 C)  TempSrc:   Oral Oral  SpO2:  96% 96%  Weight:   71 kg   Height:        General: Pt is alert, awake, not in acute distress Cardiovascular: RRR, S1/S2 +, no rubs, no gallops Respiratory: CTA bilaterally, no wheezing, no rhonchi Abdominal: Soft, NT, ND, bowel sounds + Extremities: no edema, no  cyanosis  Discharge Condition:Stable CODE STATUS: Full code Diet recommendation: Regular diet Recommendations for Outpatient Follow-up:  1. Follow up with PCP: 5-7 days (wellness clinic referral through case management) 2. Follow up with consultants: Hematology/oncology as scheduled 3. Please obtain follow up labs including: [place] services upon discharge:  Equipment/Devices upon discharge:   Discharge Instructions:  Discharge Instructions    Call MD for:  difficulty breathing, headache or visual disturbances   Complete by: As directed    Call MD for:  extreme fatigue   Complete by: As directed    Call MD for:  persistant dizziness or light-headedness   Complete by: As directed    Call MD for:  persistant nausea and vomiting   Complete by: As directed    Call MD for:  severe uncontrolled pain   Complete by: As directed    Call MD for:  temperature >100.4   Complete by: As directed    Diet - low sodium heart healthy   Complete by: As directed    Increase activity slowly   Complete by: As directed      Allergies as of 03/07/2020   No Known Allergies     Medication List    STOP taking these medications   lisinopril 40 MG tablet Commonly known as: ZESTRIL     TAKE these medications   allopurinol 100 MG tablet Commonly known as: ZYLOPRIM Take 2 tablets (200 mg total) by mouth daily.   alum & mag hydroxide-simeth 200-200-20 MG/5ML suspension Commonly known as: MAALOX/MYLANTA Take 30 mLs by mouth every 6 (six) hours as needed for indigestion or heartburn.   docusate sodium 100 MG capsule Commonly known as: COLACE Take 1 capsule (100 mg total) by mouth daily. Start taking on: March 08, 2020   ferrous gluconate 324 MG tablet Commonly known as: FERGON Take 1 tablet (324 mg total) by mouth 2 (two) times daily with a meal.   lidocaine 5 % Commonly known as: LIDODERM Place 1 patch onto the skin daily. Remove & Discard patch within 12 hours or as  directed by MD   nicotine 14 mg/24hr patch Commonly known as: NICODERM CQ - dosed in mg/24 hours Place 1 patch (14 mg total) onto the skin daily. Start taking on: March 08, 2020   ondansetron 4 MG tablet Commonly known as: ZOFRAN Take 1 tablet (4 mg total) by mouth every 6 (six) hours as needed for nausea.   oxyCODONE 10 mg 12 hr tablet Commonly known as: OXYCONTIN Take 1 tablet (10 mg total) by mouth every 12 (twelve) hours for 5 days.   oxyCODONE-acetaminophen 5-325 MG tablet Commonly known as: PERCOCET/ROXICET Take 1-2 tablets by mouth every 6 (six) hours as needed for moderate pain or severe pain.   polyethylene glycol 17 g packet Commonly known as: MIRALAX / GLYCOLAX Take 17 g by mouth daily. Start taking on: March 08, 2020   tamsulosin 0.4 MG Caps capsule Commonly known as: FLOMAX Take 1 capsule (0.4 mg total) by mouth daily after supper.       No Known Allergies    The results of significant diagnostics from this hospitalization (including imaging, microbiology, ancillary and laboratory)  are listed below for reference.    Labs: BNP (last 3 results) No results for input(s): BNP in the last 8760 hours. Basic Metabolic Panel: Recent Labs  Lab 03/03/20 0948 03/04/20 0500  NA 136 135  K 4.0 4.4  CL 103 105  CO2 21* 21*  GLUCOSE 158* 117*  BUN 19 13  CREATININE 0.98 0.97  CALCIUM 9.0 9.0   Liver Function Tests: Recent Labs  Lab 03/03/20 0948  AST 14*  ALT 8  ALKPHOS 148*  BILITOT 1.3*  PROT 6.5  ALBUMIN 2.9*   No results for input(s): LIPASE, AMYLASE in the last 168 hours. No results for input(s): AMMONIA in the last 168 hours. CBC: Recent Labs  Lab 03/03/20 0948 03/03/20 2335 03/04/20 0500 03/07/20 1019  WBC 5.9  --  6.0  --   HGB 5.8* 7.8* 8.4* 6.8*  HCT 19.8* 24.1* 26.3* 21.8*  MCV 93.4  --  87.1  --   PLT 110*  --  101*  --    Cardiac Enzymes: No results for input(s): CKTOTAL, CKMB, CKMBINDEX, TROPONINI in the last 168  hours. BNP: Invalid input(s): POCBNP CBG: No results for input(s): GLUCAP in the last 168 hours. D-Dimer No results for input(s): DDIMER in the last 72 hours. Hgb A1c No results for input(s): HGBA1C in the last 72 hours. Lipid Profile No results for input(s): CHOL, HDL, LDLCALC, TRIG, CHOLHDL, LDLDIRECT in the last 72 hours. Thyroid function studies No results for input(s): TSH, T4TOTAL, T3FREE, THYROIDAB in the last 72 hours.  Invalid input(s): FREET3 Anemia work up No results for input(s): VITAMINB12, FOLATE, FERRITIN, TIBC, IRON, RETICCTPCT in the last 72 hours. Urinalysis    Component Value Date/Time   COLORURINE YELLOW 03/03/2020 [place] 03/03/2020 1205   LABSPEC 1.027 03/03/2020 1205   PHURINE 6.0 03/03/2020 1205   GLUCOSEU NEGATIVE 03/03/2020 1205   HGBUR NEGATIVE 03/03/2020 1205   [place] 03/03/2020 1205   KETONESUR NEGATIVE 03/03/2020 1205   PROTEINUR NEGATIVE 03/03/2020 1205   NITRITE NEGATIVE 03/03/2020 1205   LEUKOCYTESUR NEGATIVE 03/03/2020 1205   Sepsis Labs Invalid input(s): PROCALCITONIN,  WBC,  LACTICIDVEN Microbiology Recent Results (from the past 240 hour(s))  Respiratory Panel by RT PCR (Flu A&B, Covid) - Nasopharyngeal Swab     Status: None   Collection Time: 03/03/20 11:20 AM   Specimen: Nasopharyngeal Swab; Nasopharyngeal(NP) swabs in vial transport medium  Result Value Ref Range Status   SARS Coronavirus 2 by RT PCR NEGATIVE NEGATIVE Final    Comment: (NOTE) SARS-CoV-2 target nucleic acids are NOT DETECTED.  The SARS-CoV-2 RNA is generally detectable in upper respiratoy specimens during the acute phase of infection. The lowest concentration of SARS-CoV-2 viral copies this assay can detect is 131 copies/mL. A negative result does not preclude SARS-Cov-2 infection and should not be used as the sole basis for treatment or other patient management decisions. A negative result may occur with  improper specimen  collection/handling, submission of specimen other than nasopharyngeal swab, presence of viral mutation(s) within the areas targeted by this assay, and inadequate number of viral copies (<131 copies/mL). A negative result must be combined with clinical observations, patient history, and epidemiological information. The expected result is Negative.  Fact Sheet for Patients:  PinkCheek.be  Fact Sheet for Healthcare Providers:  GravelBags.it  This test is no t yet approved or cleared by the [place] FDA and  has been authorized for detection and/or diagnosis of SARS-CoV-2 by FDA under an Emergency Use Authorization (  EUA). This EUA will remain  in effect (meaning this test can be used) for the duration of the COVID-19 declaration under Section 564(b)(1) of the Act, 21 U.S.C. section 360bbb-3(b)(1), unless the authorization is terminated or revoked sooner.     Influenza A by PCR NEGATIVE NEGATIVE Final   Influenza B by PCR NEGATIVE NEGATIVE Final    Comment: (NOTE) The Xpert Xpress SARS-CoV-2/FLU/RSV assay is intended as an aid in  the diagnosis of influenza from Nasopharyngeal swab specimens and  should not be used as a sole basis for treatment. Nasal washings and  aspirates are unacceptable for Xpert Xpress SARS-CoV-2/FLU/RSV  testing.  Fact Sheet for Patients: PinkCheek.be  Fact Sheet for Healthcare Providers: GravelBags.it  This test is not yet approved or cleared by the [place] FDA and  has been authorized for detection and/or diagnosis of SARS-CoV-2 by  FDA under an Emergency Use Authorization (EUA). This EUA will remain  in effect (meaning this test can be used) for the duration of the  Covid-19 declaration under Section 564(b)(1) of the Act, 21  U.S.C. section 360bbb-3(b)(1), unless the authorization is  terminated or revoked. Performed at [facility], [place] [address], [place], [place] [address]     Procedures/Studies: DG Chest 2 View  Result Date: 03/03/2020 CLINICAL DATA:  Chest pain, shortness of breath EXAM: CHEST - 2 VIEW COMPARISON:  04/25/2012 FINDINGS: The heart size and mediastinal contours are within normal limits. Mildly coarsened interstitial markings bilaterally. No focal airspace consolidation, pleural effusion, or pneumothorax. Degenerative changes of the bilateral shoulders. IMPRESSION: Mildly coarsened interstitial markings bilaterally, which may reflect bronchitic type lung changes. No focal airspace consolidation. Electronically Signed   By: Davina Poke D.O.   On: 03/03/2020 10:04   MR CERVICAL SPINE W WO CONTRAST  Result Date: 03/04/2020 CLINICAL DATA:  Cancer staging.  Right cervical radiculopathy. a EXAM: MRI CERVICAL SPINE WITHOUT AND WITH CONTRAST TECHNIQUE: Multiplanar and multiecho pulse sequences of the cervical spine, to include the craniocervical junction and cervicothoracic junction, were obtained without and with intravenous contrast. CONTRAST:  43mL GADAVIST GADOBUTROL 1 MMOL/ML IV SOLN COMPARISON:  None. FINDINGS: Alignment: Normal Vertebrae: Infiltrative low marrow signal throughout the skull base and cervical spine with patchy increased enhancement especially at the C1 posterior arch, C2 body and spinous process, C4 body, C6 spinous process, and in the upper thoracic spine as described separately. Negative for pathologic fracture. There is asymmetric enhancing material in the right canal at the level of C5-6, which is likely epidural tumor extension in this patient with right radiculopathy. No associated cord compression, question right C6 radiculopathy. Cord: Normal signal and morphology. No abnormal intrathecal enhancement Posterior Fossa, vertebral arteries, paraspinal tissues: Unremarkable appearance of the brain Disc levels: Disc degeneration primarily at C3-4 to C6-7 and greatest at C3-4 where  there is a disc protrusion contacting the ventral cord and bilateral uncovertebral spurring causing foraminal impingement. IMPRESSION: 1. Widespread osseous metastatic disease. 2. Suspect right-sided epidural tumor infiltration at C5-6. 3. No pathologic fracture. Electronically Signed   By: Monte Fantasia M.D.   On: 03/04/2020 05:58   MR THORACIC SPINE W WO CONTRAST  Result Date: 03/04/2020 CLINICAL DATA:  Cancer bone and a primary.  Neck pain. EXAM: MRI THORACIC WITHOUT AND WITH CONTRAST TECHNIQUE: Multiplanar and multiecho pulse sequences of the thoracic spine were obtained without and with intravenous contrast. CONTRAST:  52mL GADAVIST GADOBUTROL 1 MMOL/ML IV SOLN COMPARISON:  Chest CTA from yesterday FINDINGS: MRI THORACIC SPINE FINDINGS Alignment:  Physiologic Vertebrae: Confluent marrow infiltration throughout the spine with heterogeneous patchy enhancement on postcontrast imaging. Extensive medial rib infiltration is also present. No pathologic fracture. No measurable extraosseous tumor extension. No cord impingement Cord:  Normal signal and morphology. Paraspinal and other soft tissues: Negative Disc levels: No degenerative impingement. IMPRESSION: Confluent osseous metastatic disease in the thoracic spine without pathologic fracture or neural impingement. Electronically Signed   By: Monte Fantasia M.D.   On: 03/04/2020 05:31   MR Lumbar Spine W Wo Contrast  Result Date: 03/04/2020 CLINICAL DATA:  Cancer of unknown primary EXAM: MRI LUMBAR SPINE WITHOUT AND WITH CONTRAST TECHNIQUE: Multiplanar and multiecho pulse sequences of the lumbar spine were obtained without and with intravenous contrast. CONTRAST:  30mL GADAVIST GADOBUTROL 1 MMOL/ML IV SOLN COMPARISON:  None. FINDINGS: No available sagittal T2 weighted imaging. Segmentation:  5 lumbar type vertebrae Alignment:  Slight retrolisthesis at L5-S1 Vertebrae: Confluent osseous metastatic disease with diffuse hypoattenuating marrow and patchy  enhancement affecting every level and the visualized pelvis. Confirmed ventral epidural tumor infiltration at the level of S1, impinging on the right for descending S1 nerve root. There is also extraosseous tumor extension ventrally from the right sacroiliac joint region and ventrally from the left ilium. No pathologic fracture. Conus medullaris and cauda equina: Conus extends to the L1-2 level. Conus and cauda equina appear normal. Paraspinal and other soft tissues: Retroperitoneal adenopathy, known. Disc levels: Disc and facet degeneration in the lower lumbar spine with up to moderate right foraminal stenosis at L4-5. Moderate spinal stenosis at L4-5 and L5-S1. IMPRESSION: 1. Confluent metastatic disease in the lumbar spine and pelvis with ventral epidural tumor infiltration at S1 impinging on the right S1 nerve root. 2. Extraosseous tumor growth ventrally from the bilateral ilium. Electronically Signed   By: Monte Fantasia M.D.   On: 03/04/2020 05:56   CT Angio Chest/Abd/Pel for Dissection W and/or Wo Contrast  Result Date: 03/03/2020 CLINICAL DATA:  Abdominal pain with acute aortic dissection suspected EXAM: CT ANGIOGRAPHY CHEST, ABDOMEN AND PELVIS TECHNIQUE: Non-contrast CT of the chest was initially obtained. Multidetector CT imaging through the chest, abdomen and pelvis was performed using the standard protocol during bolus administration of intravenous contrast. Multiplanar reconstructed images and MIPs were obtained and reviewed to evaluate the vascular anatomy. CONTRAST:  170mL OMNIPAQUE IOHEXOL 350 MG/ML SOLN COMPARISON:  None. FINDINGS: CTA CHEST FINDINGS Cardiovascular: No intramural aortic hematoma on noncontrast phase. Normal heart size. No pericardial effusion. Mediastinum/Nodes: Negative for adenopathy or mass. Lungs/Pleura: Subpleural nodule in the anterior right chest which is related to a rib lesion. No pulmonary nodule or consolidation Musculoskeletal: Diffuse heterogeneous bony density  patchy areas of sclerosis and lucency. Pathologic anterior right third rib fracture. 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 Review of the MIP images confirms the above findings. CTA ABDOMEN AND PELVIS FINDINGS VASCULAR Aorta: Atheromatous wall thickening.  No dissection or aneurysm Celiac: Patent without evidence of aneurysm, dissection, vasculitis or significant stenosis. SMA: Moderate atheromatous narrowing proximally. No acute finding including branch occlusion. Renals: Single bilateral renal arteries which are smooth and widely patent. IMA: Patent Inflow: Atheromatous plaque without flow limiting stenosis or dissection. Veins: Negative in the arterial phase Review of the MIP images confirms the above findings. NON-VASCULAR Hepatobiliary: Lobulated liver surface compatible with cirrhosis. There is known hepatitis C.no evidence of biliary obstruction or stone. Pancreas: Coarse calcification at the head, likely post inflammatory. No acute finding. Spleen: Generous size in the setting of presumed portal hypertension. Adrenals/Urinary Tract: Negative  adrenals. No hydronephrosis or stone. Unremarkable bladder. Stomach/Bowel:  No obstruction. Mild distal colonic diverticulosis. Lymphatic: Lymphadenopathy in the pelvis and lower abdomen which is retroperitoneal. A rounded node left of the distal aorta measures 21 mm in short axis. A left external iliac node measures 2 cm in short axis on 6:161 Reproductive:Enlarged prostate with eccentric left nodularity at the bladder base. Other: No ascites or pneumoperitoneum. Musculoskeletal: Extensive sclerotic metastatic disease in the lumbar spine and pelvis with aggressive periosteal reaction along the bilateral iliac fossa. Multilevel lumbar spine degeneration with spinal stenosis at L3-4 and below. Tumor may infiltrate the right eccentric and ventral sacral spinal canal at the level of S1. No acute fracture. Review of the MIP images confirms the above  findings. IMPRESSION: 1. Widespread osseous metastatic disease and lower retroperitoneal adenopathy. Favor prostate primary. 2. Pathologic right anterior third rib fracture. 3. 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 4. Cirrhosis with splenomegaly. 5. No acute aortic finding. Electronically Signed   By: Monte Fantasia M.D.   On: 03/03/2020 11:56    Time coordinating discharge: Over 30 minutes  SIGNED:   Guilford Shi, MD  [facility] 03/07/2020, 3:43 PM

## 2020-03-07 NOTE — TOC Progression Note (Signed)
Transition of Care [facility]) - Progression Note    Patient Details  Name: Edgar Mooney MRN: [id number] Date of Birth: 1965-07-16  Transition of Care [facility]) CM/SW Contact  Bartholomew Crews, RN Phone Number: [phone] 03/07/2020, 5:31 PM  Clinical Narrative:     Spoke with patient at the bedside. He is currently getting blood transfusion. Discussed getting PCP - stated that he wants to be followed by his cancer doctor. Discussed PCP to assist with coordination of care - agreeable to follow up at [facility]. Asked about assistance with medications, discussed that he has insurance so does not qualify for medication assistance. TOC following for transition needs   Expected Discharge Plan: Home/Self Care Barriers to Discharge: Continued Medical Work up  Expected Discharge Plan and Services Expected Discharge Plan: Home/Self Care In-house Referral: Chaplain, Clinical Social Work Discharge Planning Services: CM Consult Post Acute Care Choice: NA   Expected Discharge Date: 03/07/20               DME Arranged: N/A DME Agency: NA       HH Arranged: NA           Social Determinants of Health (SDOH) Interventions    Readmission Risk Interventions No flowsheet data found.

## 2020-03-07 NOTE — Progress Notes (Addendum)
HEMATOLOGY-ONCOLOGY PROGRESS NOTE  SUBJECTIVE: The patient reports ongoing arthralgias to his right shoulder.  No bleeding reported.  PHYSICAL EXAMINATION:  Vitals:   03/06/20 1445 03/06/20 2100  BP:  110/69  Pulse:  78  Resp: 15 15  Temp:  99.5 F (37.5 C)  SpO2:  96%   Filed Weights   03/04/20 2110 03/06/20 0329 03/06/20 2100  Weight: 67.4 kg 71.1 kg 71 kg    Intake/Output from previous day: 11/21 0701 - 11/22 0700 In: 1200 [P.O.:1200] Out: -   GENERAL: Chronically ill-appearing male, no distress, sitting up in recliner SKIN: skin color, texture, turgor are normal, no rashes or significant lesions EYES: normal, Conjunctiva are pink and non-injected, sclera clear OROPHARYNX:no exudate, no erythema and lips, buccal mucosa, and tongue normal  LUNGS: clear to auscultation and percussion with normal breathing effort HEART: regular rate & rhythm and no murmurs and no lower extremity edema ABDOMEN:abdomen soft, non-tender and normal bowel sounds, induration surrounding injection sites at the right abdomen NEURO: alert & oriented x 3 with fluent speech, no focal motor/sensory deficits  LABORATORY DATA:  I have reviewed the data as listed CMP Latest Ref Rng & Units 03/04/2020 03/03/2020 12/19/2017  Glucose 70 - 99 mg/dL 117(H) 158(H) 136(H)  BUN 6 - 20 mg/dL 13 19 19   Creatinine 0.61 - 1.24 mg/dL 0.97 0.98 0.76  Sodium 135 - 145 mmol/L 135 136 144  Potassium 3.5 - 5.1 mmol/L 4.4 4.0 4.6  Chloride 98 - 111 mmol/L 105 103 104  CO2 22 - 32 mmol/L 21(L) 21(L) 22  Calcium 8.9 - 10.3 mg/dL 9.0 9.0 9.6  Total Protein 6.5 - 8.1 g/dL - 6.5 8.0  Total Bilirubin 0.3 - 1.2 mg/dL - 1.3(H) 0.8  Alkaline Phos 38 - 126 U/L - 148(H) 61  AST 15 - 41 U/L - 14(L) 80(H)  ALT 0 - 44 U/L - 8 174(H)    Lab Results  Component Value Date   WBC 6.0 03/04/2020   HGB 8.4 (L) 03/04/2020   HCT 26.3 (L) 03/04/2020   MCV 87.1 03/04/2020   PLT 101 (L) 03/04/2020   NEUTROABS 3.9 01/14/2016    DG  Chest 2 View  Result Date: 03/03/2020 CLINICAL DATA:  Chest pain, shortness of breath EXAM: CHEST - 2 VIEW COMPARISON:  04/25/2012 FINDINGS: The heart size and mediastinal contours are within normal limits. Mildly coarsened interstitial markings bilaterally. No focal airspace consolidation, pleural effusion, or pneumothorax. Degenerative changes of the bilateral shoulders. IMPRESSION: Mildly coarsened interstitial markings bilaterally, which may reflect bronchitic type lung changes. No focal airspace consolidation. Electronically Signed   By: Davina Poke D.O.   On: 03/03/2020 10:04   MR CERVICAL SPINE W WO CONTRAST  Result Date: 03/04/2020 CLINICAL DATA:  Cancer staging.  Right cervical radiculopathy. a EXAM: MRI CERVICAL SPINE WITHOUT AND WITH CONTRAST TECHNIQUE: Multiplanar and multiecho pulse sequences of the cervical spine, to include the craniocervical junction and cervicothoracic junction, were obtained without and with intravenous contrast. CONTRAST:  38mL GADAVIST GADOBUTROL 1 MMOL/ML IV SOLN COMPARISON:  None. FINDINGS: Alignment: Normal Vertebrae: Infiltrative low marrow signal throughout the skull base and cervical spine with patchy increased enhancement especially at the C1 posterior arch, C2 body and spinous process, C4 body, C6 spinous process, and in the upper thoracic spine as described separately. Negative for pathologic fracture. There is asymmetric enhancing material in the right canal at the level of C5-6, which is likely epidural tumor extension in this patient with right radiculopathy. No associated cord  compression, question right C6 radiculopathy. Cord: Normal signal and morphology. No abnormal intrathecal enhancement Posterior Fossa, vertebral arteries, paraspinal tissues: Unremarkable appearance of the brain Disc levels: Disc degeneration primarily at C3-4 to C6-7 and greatest at C3-4 where there is a disc protrusion contacting the ventral cord and bilateral uncovertebral  spurring causing foraminal impingement. IMPRESSION: 1. Widespread osseous metastatic disease. 2. Suspect right-sided epidural tumor infiltration at C5-6. 3. No pathologic fracture. Electronically Signed   By: Monte Fantasia M.D.   On: 03/04/2020 05:58   MR THORACIC SPINE W WO CONTRAST  Result Date: 03/04/2020 CLINICAL DATA:  Cancer bone and a primary.  Neck pain. EXAM: MRI THORACIC WITHOUT AND WITH CONTRAST TECHNIQUE: Multiplanar and multiecho pulse sequences of the thoracic spine were obtained without and with intravenous contrast. CONTRAST:  55mL GADAVIST GADOBUTROL 1 MMOL/ML IV SOLN COMPARISON:  Chest CTA from yesterday FINDINGS: MRI THORACIC SPINE FINDINGS Alignment:  Physiologic Vertebrae: Confluent marrow infiltration throughout the spine with heterogeneous patchy enhancement on postcontrast imaging. Extensive medial rib infiltration is also present. No pathologic fracture. No measurable extraosseous tumor extension. No cord impingement Cord:  Normal signal and morphology. Paraspinal and other soft tissues: Negative Disc levels: No degenerative impingement. IMPRESSION: Confluent osseous metastatic disease in the thoracic spine without pathologic fracture or neural impingement. Electronically Signed   By: Monte Fantasia M.D.   On: 03/04/2020 05:31   MR Lumbar Spine W Wo Contrast  Result Date: 03/04/2020 CLINICAL DATA:  Cancer of unknown primary EXAM: MRI LUMBAR SPINE WITHOUT AND WITH CONTRAST TECHNIQUE: Multiplanar and multiecho pulse sequences of the lumbar spine were obtained without and with intravenous contrast. CONTRAST:  81mL GADAVIST GADOBUTROL 1 MMOL/ML IV SOLN COMPARISON:  None. FINDINGS: No available sagittal T2 weighted imaging. Segmentation:  5 lumbar type vertebrae Alignment:  Slight retrolisthesis at L5-S1 Vertebrae: Confluent osseous metastatic disease with diffuse hypoattenuating marrow and patchy enhancement affecting every level and the visualized pelvis. Confirmed ventral epidural  tumor infiltration at the level of S1, impinging on the right for descending S1 nerve root. There is also extraosseous tumor extension ventrally from the right sacroiliac joint region and ventrally from the left ilium. No pathologic fracture. Conus medullaris and cauda equina: Conus extends to the L1-2 level. Conus and cauda equina appear normal. Paraspinal and other soft tissues: Retroperitoneal adenopathy, known. Disc levels: Disc and facet degeneration in the lower lumbar spine with up to moderate right foraminal stenosis at L4-5. Moderate spinal stenosis at L4-5 and L5-S1. IMPRESSION: 1. Confluent metastatic disease in the lumbar spine and pelvis with ventral epidural tumor infiltration at S1 impinging on the right S1 nerve root. 2. Extraosseous tumor growth ventrally from the bilateral ilium. Electronically Signed   By: Monte Fantasia M.D.   On: 03/04/2020 05:56   CT Angio Chest/Abd/Pel for Dissection W and/or Wo Contrast  Result Date: 03/03/2020 CLINICAL DATA:  Abdominal pain with acute aortic dissection suspected EXAM: CT ANGIOGRAPHY CHEST, ABDOMEN AND PELVIS TECHNIQUE: Non-contrast CT of the chest was initially obtained. Multidetector CT imaging through the chest, abdomen and pelvis was performed using the standard protocol during bolus administration of intravenous contrast. Multiplanar reconstructed images and MIPs were obtained and reviewed to evaluate the vascular anatomy. CONTRAST:  157mL OMNIPAQUE IOHEXOL 350 MG/ML SOLN COMPARISON:  None. FINDINGS: CTA CHEST FINDINGS Cardiovascular: No intramural aortic hematoma on noncontrast phase. Normal heart size. No pericardial effusion. Mediastinum/Nodes: Negative for adenopathy or mass. Lungs/Pleura: Subpleural nodule in the anterior right chest which is related to a rib lesion. No pulmonary  nodule or consolidation Musculoskeletal: Diffuse heterogeneous bony density patchy areas of sclerosis and lucency. Pathologic anterior right third rib fracture.  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 Review of the MIP images confirms the above findings. CTA ABDOMEN AND PELVIS FINDINGS VASCULAR Aorta: Atheromatous wall thickening.  No dissection or aneurysm Celiac: Patent without evidence of aneurysm, dissection, vasculitis or significant stenosis. SMA: Moderate atheromatous narrowing proximally. No acute finding including branch occlusion. Renals: Single bilateral renal arteries which are smooth and widely patent. IMA: Patent Inflow: Atheromatous plaque without flow limiting stenosis or dissection. Veins: Negative in the arterial phase Review of the MIP images confirms the above findings. NON-VASCULAR Hepatobiliary: Lobulated liver surface compatible with cirrhosis. There is known hepatitis C.no evidence of biliary obstruction or stone. Pancreas: Coarse calcification at the head, likely post inflammatory. No acute finding. Spleen: Generous size in the setting of presumed portal hypertension. Adrenals/Urinary Tract: Negative adrenals. No hydronephrosis or stone. Unremarkable bladder. Stomach/Bowel:  No obstruction. Mild distal colonic diverticulosis. Lymphatic: Lymphadenopathy in the pelvis and lower abdomen which is retroperitoneal. A rounded node left of the distal aorta measures 21 mm in short axis. A left external iliac node measures 2 cm in short axis on 6:161 Reproductive:Enlarged prostate with eccentric left nodularity at the bladder base. Other: No ascites or pneumoperitoneum. Musculoskeletal: Extensive sclerotic metastatic disease in the lumbar spine and pelvis with aggressive periosteal reaction along the bilateral iliac fossa. Multilevel lumbar spine degeneration with spinal stenosis at L3-4 and below. Tumor may infiltrate the right eccentric and ventral sacral spinal canal at the level of S1. No acute fracture. Review of the MIP images confirms the above findings. IMPRESSION: 1. Widespread osseous metastatic disease and lower  retroperitoneal adenopathy. Favor prostate primary. 2. Pathologic right anterior third rib fracture. 3. 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 4. Cirrhosis with splenomegaly. 5. No acute aortic finding. Electronically Signed   By: Monte Fantasia M.D.   On: 03/03/2020 11:56    ASSESSMENT AND PLAN: 1.  Metastatic prostate cancer - Lytic bone lesions with retroperitoneal adenopathy concerning for metastatic prostate cancer -03/03/2020-CTA chest/abdomen/pelvis widespread osseous metastatic disease and lower retroperitoneal adenopathy, pathologic right anterior third rib fracture, probable spinal canal tumor at the level of S1, -MRI cervical, thoracic, and lumbar spine 03/03/2020-diffuse osseous metastatic disease, ventral epidural tumor impinging on right S1 nerve root, extraosseous tumor at the bilateral ilium, asymmetric enhancing material at the right C5-6 canal-right  -03/03/2020-PSA 763 2.  Severe anemia-likely secondary to metastatic prostate cancer involving the bones 3.  Mild thrombocytopenia 4.  Cirrhosis with splenomegaly 5.  History of hepatitis C 6.  History of polysubstance abuse 7.  Depression 8.  Tobacco dependence 9.  Right arm weakness, right facial numbness-potentially related to nerve root compromise from metastatic bone lesions 10.  Pain secondary to #1  Mr. Tosh appears unchanged.  Pain is moderately well controlled with OxyContin 10 mg every 12 hours, Percocet every 6 hours as needed, and hydromorphone 0.5 mg IV every 3 hours as needed.  Pain due to lytic lesions from metastatic prostate cancer. He received his first dose of Firmagon on 03/04/2020 and tolerated it well.  The patient has ongoing anemia due to metastatic prostate cancer involving the bone/bone marrow.  He is scheduled to receive 1 unit PRBCs today.  Recommendations: 1.  Continue current pain medications.  Recommend for him to be discharged to home with OxyContin and Percocet. 2.  Transfuse 1 unit  PRBCs today. 3.  Outpatient follow-up  has been arranged at the cancer center on 03/14/2020.  Okay to discharge patient home from our standpoint when pain adequately controlled and hemoglobin is above 8.   LOS: 4 days   Mikey Bussing, DNP, AGPCNP-BC, AOCNP 03/07/20 Mr. Basso was interviewed and examined.  He reports improvement in neck pain.  He has induration at the right abdomen injection sites.  I suspect the anemia will improve over the next several weeks as the prostate cancer response to androgen deprivation therapy.  He does not have iron deficiency, he should not need iron therapy.  The pain should improve over the next few weeks.  We will wean him off of narcotics as an outpatient.  Julieanne Manson, MD

## 2020-03-07 NOTE — Progress Notes (Signed)
   03/07/20 1437  Clinical Encounter Type  Visited With Patient  Visit Type Initial  Referral From Physician  Consult/Referral To Chaplain  Spiritual Encounters  Spiritual Needs Prayer  Stress Factors  Patient Stress Factors Health changes;Major life changes   Chaplain responded to consult request. Pt stated he has a good support system with his kids. Pt is glad his granddaughter is old enough to have memories with him. Pt requested prayer. Chaplain let him know how to request a chaplain again, if desired.  This note was prepared by Chaplain Resident, Dante Gang, MDiv. Chaplain remains available as needed through the on-call pager: [phone].

## 2020-03-08 LAB — TYPE AND SCREEN
ABO/RH(D): A POS
Antibody Screen: NEGATIVE
Unit division: 0

## 2020-03-08 LAB — BPAM RBC
Blood Product Expiration Date: 202112032359
ISSUE DATE / TIME: 202111221603
Unit Type and Rh: 6200

## 2020-03-08 NOTE — Plan of Care (Signed)
Goals met 

## 2020-03-08 NOTE — TOC Transition Note (Signed)
Transition of Care [facility]) - CM/SW Discharge Note   Patient Details  Name: DAISON BRAXTON MRN: [id number] Date of Birth: [birth year]  Transition of Care [facility]) CM/SW Contact:  Bartholomew Crews, RN Phone Number: [phone] 03/08/2020, 11:19 AM   Clinical Narrative:     Spoke with patient at the bedside to discuss transition home today. Discussed hospital follow up appointment at [facility] on Thursday 12/16 4:10 pm. Patient also has an oncology f/up appt on Monday 11/29. No further TOC needs identified at this time.   Final next level of care: Home/Self Care Barriers to Discharge: No Barriers Identified   Patient Goals and CMS Choice   CMS Medicare.gov Compare Post Acute Care list provided to:: Patient Choice offered to / list presented to : NA  Discharge Placement                       Discharge Plan and Services In-house Referral: Chaplain, Clinical Social Work Discharge Planning Services: CM Consult Post Acute Care Choice: NA          DME Arranged: N/A DME Agency: NA       HH Arranged: NA          Social Determinants of Health (SDOH) Interventions     Readmission Risk Interventions No flowsheet data found.

## 2020-03-14 ENCOUNTER — Telehealth: Payer: Self-pay | Admitting: Pharmacist

## 2020-03-14 ENCOUNTER — Telehealth: Payer: Self-pay | Admitting: Nurse Practitioner

## 2020-03-14 ENCOUNTER — Encounter: Payer: Self-pay | Admitting: Nurse Practitioner

## 2020-03-14 ENCOUNTER — Inpatient Hospital Stay: Payer: BC Managed Care – PPO | Attending: Nurse Practitioner | Admitting: Nurse Practitioner

## 2020-03-14 ENCOUNTER — Other Ambulatory Visit: Payer: Self-pay | Admitting: Nurse Practitioner

## 2020-03-14 ENCOUNTER — Inpatient Hospital Stay: Payer: BC Managed Care – PPO

## 2020-03-14 ENCOUNTER — Telehealth: Payer: Self-pay

## 2020-03-14 ENCOUNTER — Other Ambulatory Visit: Payer: Self-pay

## 2020-03-14 VITALS — BP 138/76 | HR 79 | Temp 97.8°F | Resp 18 | Ht 62.0 in | Wt 150.7 lb

## 2020-03-14 DIAGNOSIS — C61 Malignant neoplasm of prostate: Secondary | ICD-10-CM

## 2020-03-14 DIAGNOSIS — F1911 Other psychoactive substance abuse, in remission: Secondary | ICD-10-CM | POA: Diagnosis not present

## 2020-03-14 DIAGNOSIS — Z79899 Other long term (current) drug therapy: Secondary | ICD-10-CM | POA: Diagnosis not present

## 2020-03-14 DIAGNOSIS — F32A Depression, unspecified: Secondary | ICD-10-CM | POA: Insufficient documentation

## 2020-03-14 DIAGNOSIS — Z79818 Long term (current) use of other agents affecting estrogen receptors and estrogen levels: Secondary | ICD-10-CM | POA: Diagnosis not present

## 2020-03-14 DIAGNOSIS — Z8619 Personal history of other infectious and parasitic diseases: Secondary | ICD-10-CM | POA: Diagnosis not present

## 2020-03-14 DIAGNOSIS — R161 Splenomegaly, not elsewhere classified: Secondary | ICD-10-CM | POA: Insufficient documentation

## 2020-03-14 DIAGNOSIS — D696 Thrombocytopenia, unspecified: Secondary | ICD-10-CM | POA: Insufficient documentation

## 2020-03-14 DIAGNOSIS — R531 Weakness: Secondary | ICD-10-CM | POA: Diagnosis not present

## 2020-03-14 DIAGNOSIS — K7460 Unspecified cirrhosis of liver: Secondary | ICD-10-CM | POA: Diagnosis not present

## 2020-03-14 DIAGNOSIS — C7951 Secondary malignant neoplasm of bone: Secondary | ICD-10-CM | POA: Insufficient documentation

## 2020-03-14 LAB — CBC WITH DIFFERENTIAL (CANCER CENTER ONLY)
Abs Immature Granulocytes: 1.17 10*3/uL — ABNORMAL HIGH (ref 0.00–0.07)
Basophils Absolute: 0.1 10*3/uL (ref 0.0–0.1)
Basophils Relative: 1 %
Eosinophils Absolute: 0 10*3/uL (ref 0.0–0.5)
Eosinophils Relative: 0 %
HCT: 29 % — ABNORMAL LOW (ref 39.0–52.0)
Hemoglobin: 9.1 g/dL — ABNORMAL LOW (ref 13.0–17.0)
Immature Granulocytes: 16 %
Lymphocytes Relative: 22 %
Lymphs Abs: 1.6 10*3/uL (ref 0.7–4.0)
MCH: 28.1 pg (ref 26.0–34.0)
MCHC: 31.4 g/dL (ref 30.0–36.0)
MCV: 89.5 fL (ref 80.0–100.0)
Monocytes Absolute: 0.5 10*3/uL (ref 0.1–1.0)
Monocytes Relative: 7 %
Neutro Abs: 3.9 10*3/uL (ref 1.7–7.7)
Neutrophils Relative %: 54 %
Platelet Count: 163 10*3/uL (ref 150–400)
RBC: 3.24 MIL/uL — ABNORMAL LOW (ref 4.22–5.81)
RDW: 16.4 % — ABNORMAL HIGH (ref 11.5–15.5)
WBC Count: 7.2 10*3/uL (ref 4.0–10.5)
nRBC: 0.7 % — ABNORMAL HIGH (ref 0.0–0.2)

## 2020-03-14 LAB — SAMPLE TO BLOOD BANK

## 2020-03-14 MED ORDER — PREDNISONE 5 MG PO TABS: 5.0000 mg | ORAL_TABLET | Freq: Every day | ORAL | 5 refills | Status: DC

## 2020-03-14 MED ORDER — ABIRATERONE ACETATE 250 MG PO TABS: 1000.0000 mg | ORAL_TABLET | Freq: Every day | ORAL | 0 refills | Status: DC

## 2020-03-14 MED ORDER — HYDROCODONE-ACETAMINOPHEN 5-325 MG PO TABS: 1.0000 | ORAL_TABLET | Freq: Four times a day (QID) | ORAL | 0 refills | Status: DC | PRN

## 2020-03-14 NOTE — Telephone Encounter (Signed)
Oral Oncology Patient Advocate Encounter  Received notification from [facility] [place] that prior authorization for Edgar Mooney is required.  PA submitted on CoverMyMeds Key [id number] Status is pending  Oral Oncology Clinic will continue to follow.  [name] Phone [phone] Fax [phone] 03/14/2020 2:42 PM

## 2020-03-14 NOTE — Progress Notes (Addendum)
Edgar Mooney OFFICE PROGRESS NOTE   Diagnosis: Prostate cancer  INTERVAL HISTORY:   Mr. Launer returns for his first outpatient visit.  He has noted some improvement in pain.  Main pain is located at the sternum.  He is taking 2-3 Percocet tablets a day with fairly good relief.  He denies nausea/vomiting.  No constipation or diarrhea.  Objective:  Vital signs in last 24 hours:  Blood pressure 138/76, pulse 79, temperature 97.8 F (36.6 C), temperature source Tympanic, resp. rate 18, height 5\' 2"  (1.575 m), weight 150 lb 11.2 oz (68.4 kg), SpO2 100 %.    HEENT: No thrush or ulcers. Resp: Distant breath sounds.  No respiratory distress. Cardio: Regular rate and rhythm. GI: Abdomen soft and nontender.  No hepatomegaly.  Resolving induration injection sites right abdomen. Vascular: No leg edema. Neuro: And oriented.   Lab Results:  Lab Results  Component Value Date   WBC 7.2 03/14/2020   HGB 9.1 (L) 03/14/2020   HCT 29.0 (L) 03/14/2020   MCV 89.5 03/14/2020   PLT 163 03/14/2020   NEUTROABS PENDING 03/14/2020    Imaging:  No results found.  Medications: I have reviewed the patient's current medications.  Assessment/Plan: 1.  Metastatic prostate cancer -Lytic bone lesions with retroperitoneal adenopathy concerning for metastatic prostate cancer -03/03/2020-CTA chest/abdomen/pelvis widespread osseous metastatic disease and lower retroperitoneal adenopathy, pathologic right anterior third rib fracture, probable spinal canal tumor at the level of S1, -MRI cervical, thoracic, and lumbar spine 03/03/2020-diffuse osseous metastatic disease, ventral epidural tumor impinging on right S1 nerve root, extraosseous tumor at the bilateral ilium, asymmetric enhancing material at the right C5-6 canal-right  -03/03/2020-PSA 763 -Degarelix 03/04/2020 2.Severe anemia-likely secondary to metastatic prostate cancer involving the bones 3. Mild thrombocytopenia 4.  Cirrhosis with splenomegaly 5. History of hepatitis C 6. History of polysubstance abuse 7. Depression 8. Tobacco dependence 9.Right arm weakness, right facial numbness-potentially related to nerve root compromise from metastatic bone lesions 10.Pain secondary to #1  Disposition: Mr. Wilkie appears stable.  He has metastatic prostate cancer.  We reviewed the diagnosis, prognosis and treatment options at today's visit.  He understands that no therapy will be curative.  He received a dose of degarelix in the hospital on 03/04/2020.  Plan to begin Lupron every 3-4 months at a 1 month interval from the degarelix injection.  We discussed the hot flashes associated with Lupron.  Dr. Benay Spice also recommends Zytiga/prednisone.  We reviewed potential toxicities associated with Zytiga including hot flashes, arthralgias, myalgias, liver toxicity and need for LFT monitoring, rash, diarrhea.  We discussed potential toxicities associated with prednisone including hyperglycemia, decreased bone strength, increased risk for infection, cataract formation, emotional lability.  Dr. Benay Spice also recommends Zometa.  We reviewed potential toxicities including flulike symptoms and osteonecrosis of the jaw.  Mr. Doswell agrees to proceed with the above.  His pain has started to improve.  We will send a prescription to his pharmacy for hydrocodone.  He will return for lab, follow-up, Lupron and Zometa on 03/31/2020.  He will contact the office in the interim with any problems.  Patient seen with Dr. Benay Spice.    Ned Card ANP/GNP-BC   03/14/2020  9:39 AM This was a shared visit with Ned Card.  Mr. Knierim has been diagnosed with metastatic prostate cancer.  His pain and the anemia have improved since discharge from the hospital.  The plan is to add abiraterone/prednisone to the systemic treatment regimen.  We reviewed potential toxicities associated with abiraterone.  He agrees to proceed.  He will be  scheduled for an office visit, labs, and Lupron on 03/31/2020.  He will also begin Zometa therapy.  Julieanne Manson, MD

## 2020-03-14 NOTE — Telephone Encounter (Signed)
Scheduled appointments per 11/29 los. Spoke to patient who is aware of appointments dates and times.

## 2020-03-14 NOTE — Telephone Encounter (Signed)
Oral Oncology Pharmacist Encounter  Received new prescription for Zytiga (abiraterone) for the treatment of metastatic prostate cancer in conjunction with prednisone and ADT, planned duration until disease progression or unacceptable drug toxicity.  Prescription dose and frequency assessed for appropriateness. Appropriate for therapy initiation.   CBC w/ Diff from 03/14/20 and CMP from 03/03/20 assessed, noted Hgb 9.1 g/dL (s/p 1 unit PRBC on 03/07/20), PSA of 763 ng/mL on 03/03/20 - all other labs stable for treatment initiation.  Current medication list in Epic reviewed, DDIs with Zytiga identified:  Category C DDI between Zytiga and Tamsulosin - Zytiga may increase serum concentrations of tamsulosin through CYP2D6 inhibition, will counsel patient to monitor for increased ADEs of tamsulosin including, but not limited to hypotension  Evaluated chart and no patient barriers to medication adherence noted.   Prescription has been e-scribed to the [facility] for benefits analysis and approval.  Oral Oncology Clinic will continue to follow for insurance authorization, copayment issues, initial counseling and start date.  Leron Croak, PharmD, BCPS Hematology/Oncology Clinical Pharmacist [facility] [phone] 03/14/2020 2:50 PM

## 2020-03-15 ENCOUNTER — Encounter: Payer: Self-pay | Admitting: *Deleted

## 2020-03-15 NOTE — Telephone Encounter (Signed)
Oral Oncology Patient Advocate Encounter  Prior Authorization for [name] has been approved.    PA# [id number] Effective dates: 03/14/20 through 03/13/21  Patients co-pay is $0  Oral Oncology Clinic will continue to follow.    [place] Patient [place] Phone [phone] Fax [phone] 03/15/2020 8:42 AM

## 2020-03-15 NOTE — Progress Notes (Signed)
[place] Work  Initial Assessment   Edgar Mooney is a 54 y.o. year old male recently diagnosed with metastatic prostate cancer. Clinical Social Work was referred by medical oncology for assessment of psychosocial needs.   SDOH (Social Determinants of Health) assessments performed: Yes SDOH Interventions     Most Recent Value  SDOH Interventions  Food Insecurity Interventions Other (Comment)  Financial Strain Interventions Other (Comment)  Housing Interventions Other (Comment), Intervention Not Indicated  Intimate Partner Violence Interventions Intervention Not Indicated  Social Connections Interventions Intervention Not Indicated  Transportation Interventions Other (Comment)      Distress Screen completed: No No flowsheet data found.    Family/Social Information:   Housing Arrangement: patient lives with "mother of my children"  recently moved from [place], [place]  Family members/support persons in your life? Family, "my children's mother"  Transportation concerns: cost of gas-sherrill fund   Employment: Unemployed. Income source: No income  Financial concerns: Yes, due to illness and/or loss of work during treatment o Type of concern: Phone, Transportation and Medical bills  Food access concerns: no  Religious or spiritual practice: did not indicate   Medication Concerns: no   Services Currently in place:  Referral submitted to [facility]  Coping/ Adjustment to diagnosis:  Patient understands treatment plan and what happens next? yes  Concerns about diagnosis and/or treatment: Overwhelmed by information and How I will pay for the services I need  Patient reported stressors: Finances  Patient enjoys time with family/ friends  Current coping skills/ strengths: Communication skills    SUMMARY: Current SDOH Barriers:   Financial constraints related to unemployment   Interventions:  Discussed common feeling and emotions when being  diagnosed with cancer, and the importance of support during treatment  Informed patient of the support team roles and support services at [facility]  Provided [place] contact information and encouraged patient to call with any questions or concerns  Referred patient to [employer] for social security disability    Informed patient of the [place] and contacted Financial Support Specialist     Follow Up Plan: CSW will see patient on 04/01/20 during 1st chemo to enroll in [facility] Patient verbalizes understanding of plan: Yes    Johnnye Lana, MSW, LCSW, OSW-C Clinical Social Worker [place] [phone]

## 2020-03-16 NOTE — Telephone Encounter (Signed)
Oral Chemotherapy Pharmacist Encounter   Attempted to reach patient to provide update and offer for initial counseling on oral medication: Zytiga (abiraterone).   No answer. Left voicemail for patient to call back to discuss details of medication acquisition and initial counseling session.  Leron Croak, PharmD, BCPS Hematology/Oncology Clinical Pharmacist [facility] [phone] 03/16/2020 9:17 AM

## 2020-03-18 ENCOUNTER — Other Ambulatory Visit: Payer: Self-pay | Admitting: Oncology

## 2020-03-18 ENCOUNTER — Encounter: Payer: Self-pay | Admitting: Oncology

## 2020-03-18 ENCOUNTER — Telehealth: Payer: Self-pay | Admitting: *Deleted

## 2020-03-18 ENCOUNTER — Encounter: Payer: Self-pay | Admitting: General Practice

## 2020-03-18 DIAGNOSIS — C61 Malignant neoplasm of prostate: Secondary | ICD-10-CM

## 2020-03-18 MED ORDER — HYDROCODONE-ACETAMINOPHEN 5-325 MG PO TABS: 1.0000 | ORAL_TABLET | Freq: Four times a day (QID) | ORAL | 0 refills | Status: DC | PRN

## 2020-03-18 NOTE — Progress Notes (Signed)
[place] CSW Progress Notes  Call from patient.  He is almost out of him pain medications and wants help requesting a refill.  States he has called the main [place] number and has been unable to reach anyone, has also spoken w his pharmacy who informed him he has no refills left.  Message passed to [place] [facility] and Dr Benay Spice.  Patient also wants to be sure his referral has been sent to [facility] - per [place] note, it has been.  He has been contacted by [facility] and they are mailing him paperwork to sign.  He was given Red Christians contact information as she is his Estate manager/land agent - he would like to apply for [employer].  CSW will see him at infusion on 12/17 re the [employer].  Edwyna Shell, LCSW Clinical Social Worker Phone:  [phone]

## 2020-03-18 NOTE — Progress Notes (Signed)
Patient called again regarding medication refill.  Advised him a message was sent to provider/RN previously by Education officer, museum and his care team will respond to him directly.  He is to contact me should he receive approval for medication to be filled to arrange for grant documentation and paperwork to be completed.  He has my contact information for any additional financial questions or concerns.

## 2020-03-18 NOTE — Telephone Encounter (Signed)
Patient called office several times today leaving voice mail and asking financial advocate for refill on his hydrocodone. Told RN he was taking #2 every 6 hours and still having some pain (last office visit he said pain was better). Says pain 8 premed and 5 afterwards. Per Dr. Benay Spice :refill approved and he is only to take #1 every 6 hours as needed and to supplement with ibuprofen. He plans to start weaning him off the pain medication. Called back and a male answered his phone (he was not there). She will forward this information to him.

## 2020-03-18 NOTE — Progress Notes (Signed)
Received call from patient whom had medication concerns.  Advised him Education officer, museum sent message to RN and dr regarding medication fill.  Introduced myself as Arboriculturist and discussed the one-time Warehouse manager. Advised patient letter of support is needed to assist him with medication and personal expenses while going through treatment. He asked me to explain to his friend Marlowe Kays. Provided Marlowe Kays with details of letter of support. She verbalized understanding.   Patient to contact if medication approved, to schedule a time to bring letter of support for grant. He has my contact name and number.

## 2020-03-21 MED FILL — predniSONE 5 MG TABS: 5 | 30 days supply | Qty: 30 | Fill #0

## 2020-03-21 MED FILL — ABIRATERONE ACETATE 250 MG: 250 | 15 days supply | Qty: 60 | Fill #0

## 2020-03-21 NOTE — Telephone Encounter (Addendum)
Oral Chemotherapy Pharmacist Encounter  I spoke with patient for overview of: Zytiga for the treatment of metastatic prostate cancer in conjunction with prednisone, planned duration until disease progression or unacceptable toxicity.   Counseled patient on administration, dosing, side effects, monitoring, drug-food interactions, safe handling, storage, and disposal.  Patient will take Zytiga 250mg  tablets, 4 tablets (1000mg ) by mouth once daily on an empty stomach, 1 hour before or 2 hours after a meal.  Patient states he will take his Zytiga in the morning when he wakes up and will wait at least 1 hour before eating.  Patient will take prednisone 5mg  tablet, 1 tablet by mouth one daily with breakfast.  Zytiga start date: 03/22/20  Adverse effects include but are not limited to: peripheral edema, GI upset, hypertension, hot flashes, fatigue, and arthralgias.    Prednisone prescription has been sent to [facility] on 03/14/20. Patient will obtain prednisone and knows to start prednisone on the same day as Zytiga start.  Reviewed with patient importance of keeping a medication schedule and plan for any missed doses. No barriers to medication adherence identified.  Medication reconciliation performed and medication/allergy list updated.  Insurance authorization for [name] has been obtained. Test claim at the pharmacy revealed copayment $0 for 1st fill of Zytiga. Patient will pick up from the [facility] on 03/22/20.   Patient informed the pharmacy will reach out 5-7 days prior to needing next fill of Zytiga to coordinate continued medication acquisition to prevent break in therapy.  All questions answered.  [name] voiced understanding and appreciation.   Medication education handout placed in mail for patient. Patient knows to call the office with questions or concerns. Oral Chemotherapy Clinic phone number provided to patient.   Leron Croak, PharmD,  BCPS Hematology/Oncology Clinical Pharmacist [facility] [phone] 03/21/2020 4:20 PM

## 2020-03-21 NOTE — Telephone Encounter (Signed)
Oral Chemotherapy Pharmacist Encounter   Attempted to reach patient to provide update and offer for initial counseling on oral medication: Zytiga (abiraterone).   No answer. Left voicemail for patient to call back to discuss details of medication acquisition and initial counseling session.  Leron Croak, PharmD, BCPS Hematology/Oncology Clinical Pharmacist [facility] [phone] 03/21/2020 1:40 PM

## 2020-03-28 ENCOUNTER — Encounter: Payer: Self-pay | Admitting: Oncology

## 2020-03-28 NOTE — Progress Notes (Signed)
Patient called w/ questions regarding letter received from [facility]. He read the letter and I attempted to explain to him. Advised him to contact the number on the letter to obtain further clarification and instruction. He verbalized understanding.  He has my card for any additional financial questions or concerns.

## 2020-03-31 ENCOUNTER — Inpatient Hospital Stay: Payer: BC Managed Care – PPO | Admitting: Physician Assistant

## 2020-04-01 ENCOUNTER — Telehealth: Payer: Self-pay

## 2020-04-01 ENCOUNTER — Inpatient Hospital Stay: Payer: BC Managed Care – PPO | Admitting: General Practice

## 2020-04-01 ENCOUNTER — Inpatient Hospital Stay (HOSPITAL_BASED_OUTPATIENT_CLINIC_OR_DEPARTMENT_OTHER): Payer: BC Managed Care – PPO | Admitting: Oncology

## 2020-04-01 ENCOUNTER — Inpatient Hospital Stay: Payer: BC Managed Care – PPO | Attending: Nurse Practitioner

## 2020-04-01 ENCOUNTER — Inpatient Hospital Stay: Payer: BC Managed Care – PPO

## 2020-04-01 ENCOUNTER — Other Ambulatory Visit: Payer: Self-pay

## 2020-04-01 VITALS — BP 138/83 | HR 69 | Temp 97.6°F | Resp 18 | Ht 62.0 in | Wt 150.5 lb

## 2020-04-01 DIAGNOSIS — C61 Malignant neoplasm of prostate: Secondary | ICD-10-CM | POA: Insufficient documentation

## 2020-04-01 DIAGNOSIS — M109 Gout, unspecified: Secondary | ICD-10-CM | POA: Diagnosis not present

## 2020-04-01 DIAGNOSIS — F1911 Other psychoactive substance abuse, in remission: Secondary | ICD-10-CM | POA: Insufficient documentation

## 2020-04-01 DIAGNOSIS — F32A Depression, unspecified: Secondary | ICD-10-CM | POA: Diagnosis not present

## 2020-04-01 DIAGNOSIS — C7951 Secondary malignant neoplasm of bone: Secondary | ICD-10-CM | POA: Insufficient documentation

## 2020-04-01 DIAGNOSIS — Z5111 Encounter for antineoplastic chemotherapy: Secondary | ICD-10-CM | POA: Insufficient documentation

## 2020-04-01 DIAGNOSIS — D696 Thrombocytopenia, unspecified: Secondary | ICD-10-CM | POA: Diagnosis not present

## 2020-04-01 DIAGNOSIS — Z79899 Other long term (current) drug therapy: Secondary | ICD-10-CM | POA: Insufficient documentation

## 2020-04-01 LAB — CBC WITH DIFFERENTIAL (CANCER CENTER ONLY)
Abs Immature Granulocytes: 0.38 10*3/uL — ABNORMAL HIGH (ref 0.00–0.07)
Basophils Absolute: 0 10*3/uL (ref 0.0–0.1)
Basophils Relative: 1 %
Eosinophils Absolute: 0.1 10*3/uL (ref 0.0–0.5)
Eosinophils Relative: 1 %
HCT: 30.3 % — ABNORMAL LOW (ref 39.0–52.0)
Hemoglobin: 9.8 g/dL — ABNORMAL LOW (ref 13.0–17.0)
Immature Granulocytes: 7 %
Lymphocytes Relative: 22 %
Lymphs Abs: 1.2 10*3/uL (ref 0.7–4.0)
MCH: 29.6 pg (ref 26.0–34.0)
MCHC: 32.3 g/dL (ref 30.0–36.0)
MCV: 91.5 fL (ref 80.0–100.0)
Monocytes Absolute: 0.5 10*3/uL (ref 0.1–1.0)
Monocytes Relative: 9 %
Neutro Abs: 3.2 10*3/uL (ref 1.7–7.7)
Neutrophils Relative %: 60 %
Platelet Count: 90 10*3/uL — ABNORMAL LOW (ref 150–400)
RBC: 3.31 MIL/uL — ABNORMAL LOW (ref 4.22–5.81)
RDW: 18.2 % — ABNORMAL HIGH (ref 11.5–15.5)
WBC Count: 5.3 10*3/uL (ref 4.0–10.5)
nRBC: 0.8 % — ABNORMAL HIGH (ref 0.0–0.2)

## 2020-04-01 LAB — CMP (CANCER CENTER ONLY)
ALT: 8 U/L (ref 0–44)
AST: 11 U/L — ABNORMAL LOW (ref 15–41)
Albumin: 3.9 g/dL (ref 3.5–5.0)
Alkaline Phosphatase: 431 U/L — ABNORMAL HIGH (ref 38–126)
Anion gap: 11 (ref 5–15)
BUN: 21 mg/dL — ABNORMAL HIGH (ref 6–20)
CO2: 22 mmol/L (ref 22–32)
Calcium: 9.4 mg/dL (ref 8.9–10.3)
Chloride: 107 mmol/L (ref 98–111)
Creatinine: 0.84 mg/dL (ref 0.61–1.24)
GFR, Estimated: >60 mL/min (ref >60)
Glucose, Bld: 108 mg/dL — ABNORMAL HIGH (ref 70–99)
Potassium: 4.3 mmol/L (ref 3.5–5.1)
Sodium: 140 mmol/L (ref 135–145)
Total Bilirubin: 1.4 mg/dL — ABNORMAL HIGH (ref 0.3–1.2)
Total Protein: 7.4 g/dL (ref 6.5–8.1)

## 2020-04-01 LAB — PHOSPHORUS: Phosphorus: 3.7 mg/dL (ref 2.5–4.6)

## 2020-04-01 LAB — TRIGLYCERIDES: Triglycerides: 251 mg/dL — ABNORMAL HIGH (ref <150)

## 2020-04-01 MED ORDER — INDOMETHACIN 50 MG PO CAPS: 50.0000 mg | ORAL_CAPSULE | Freq: Three times a day (TID) | ORAL | 0 refills | Status: DC

## 2020-04-01 MED ORDER — SODIUM CHLORIDE 0.9 % IV SOLN
Freq: Once | INTRAVENOUS | Status: AC
  Filled 2020-04-01: qty 250

## 2020-04-01 MED ORDER — LEUPROLIDE ACETATE (3 MONTH) 22.5 MG ~~LOC~~ KIT
PACK | SUBCUTANEOUS | Status: AC
  Filled 2020-04-01: qty 22.5

## 2020-04-01 MED ORDER — ONDANSETRON HCL 4 MG PO TABS: 4.0000 mg | ORAL_TABLET | Freq: Four times a day (QID) | ORAL | 0 refills | Status: DC | PRN

## 2020-04-01 MED ORDER — ZOLEDRONIC ACID 4 MG/100ML IV SOLN
INTRAVENOUS | Status: AC
  Filled 2020-04-01: qty 100

## 2020-04-01 MED ORDER — LEUPROLIDE ACETATE (3 MONTH) 22.5 MG ~~LOC~~ KIT
22.5000 mg | PACK | Freq: Once | SUBCUTANEOUS | Status: AC
  Administered 2020-04-01: 11:00:00 22.5 mg via SUBCUTANEOUS

## 2020-04-01 MED ORDER — ALLOPURINOL 100 MG PO TABS: 100.0000 mg | ORAL_TABLET | Freq: Every day | ORAL | 2 refills | Status: DC

## 2020-04-01 MED ORDER — ZOLEDRONIC ACID 4 MG/100ML IV SOLN
4.0000 mg | Freq: Once | INTRAVENOUS | Status: AC
  Administered 2020-04-01: 10:00:00 4 mg via INTRAVENOUS

## 2020-04-01 MED ORDER — HYDROCODONE-ACETAMINOPHEN 5-325 MG PO TABS: 1.0000 | ORAL_TABLET | Freq: Four times a day (QID) | ORAL | 0 refills | Status: DC | PRN

## 2020-04-01 NOTE — Telephone Encounter (Signed)
Patient made aware of labs and followup

## 2020-04-01 NOTE — Patient Instructions (Signed)
Zoledronic Acid injection (Hypercalcemia, Oncology) What is this medicine? ZOLEDRONIC ACID (ZOE le dron ik AS id) lowers the amount of calcium loss from bone. It is used to treat too much calcium in your blood from cancer. It is also used to prevent complications of cancer that has spread to the bone. This medicine may be used for other purposes; ask your health care provider or pharmacist if you have questions. COMMON BRAND NAME(S): Zometa What should I tell my health care provider before I take this medicine? They need to know if you have any of these conditions:  aspirin-sensitive asthma  cancer, especially if you are receiving medicines used to treat cancer  dental disease or wear dentures  infection  kidney disease  receiving corticosteroids like dexamethasone or prednisone  an unusual or allergic reaction to zoledronic acid, other medicines, foods, dyes, or preservatives  pregnant or trying to get pregnant  breast-feeding How should I use this medicine? This medicine is for infusion into a vein. It is given by a health care professional in a hospital or clinic setting. Talk to your pediatrician regarding the use of this medicine in children. Special care may be needed. Overdosage: If you think you have taken too much of this medicine contact a poison control center or emergency room at once. NOTE: This medicine is only for you. Do not share this medicine with others. What if I miss a dose? It is important not to miss your dose. Call your doctor or health care professional if you are unable to keep an appointment. What may interact with this medicine?  certain antibiotics given by injection  NSAIDs, medicines for pain and inflammation, like ibuprofen or naproxen  some diuretics like bumetanide, furosemide  teriparatide  thalidomide This list may not describe all possible interactions. Give your health care provider a list of all the medicines, herbs, non-prescription  drugs, or dietary supplements you use. Also tell them if you smoke, drink alcohol, or use illegal drugs. Some items may interact with your medicine. What should I watch for while using this medicine? Visit your doctor or health care professional for regular checkups. It may be some time before you see the benefit from this medicine. Do not stop taking your medicine unless your doctor tells you to. Your doctor may order blood tests or other tests to see how you are doing. Women should inform their doctor if they wish to become pregnant or think they might be pregnant. There is a potential for serious side effects to an unborn child. Talk to your health care professional or pharmacist for more information. You should make sure that you get enough calcium and vitamin D while you are taking this medicine. Discuss the foods you eat and the vitamins you take with your health care professional. Some people who take this medicine have severe bone, joint, and/or muscle pain. This medicine may also increase your risk for jaw problems or a broken thigh bone. Tell your doctor right away if you have severe pain in your jaw, bones, joints, or muscles. Tell your doctor if you have any pain that does not go away or that gets worse. Tell your dentist and dental surgeon that you are taking this medicine. You should not have major dental surgery while on this medicine. See your dentist to have a dental exam and fix any dental problems before starting this medicine. Take good care of your teeth while on this medicine. Make sure you see your dentist for regular follow-up  appointments. What side effects may I notice from receiving this medicine? Side effects that you should report to your doctor or health care professional as soon as possible:  allergic reactions like skin rash, itching or hives, swelling of the face, lips, or tongue  anxiety, confusion, or depression  breathing problems  changes in vision  eye  pain  feeling faint or lightheaded, falls  jaw pain, especially after dental work  mouth sores  muscle cramps, stiffness, or weakness  redness, blistering, peeling or loosening of the skin, including inside the mouth  trouble passing urine or change in the amount of urine Side effects that usually do not require medical attention (report to your doctor or health care professional if they continue or are bothersome):  bone, joint, or muscle pain  constipation  diarrhea  fever  hair loss  irritation at site where injected  loss of appetite  nausea, vomiting  stomach upset  trouble sleeping  trouble swallowing  weak or tired This list may not describe all possible side effects. Call your doctor for medical advice about side effects. You may report side effects to FDA at 1-800-FDA-1088. Where should I keep my medicine? This drug is given in a hospital or clinic and will not be stored at home. NOTE: This sheet is a summary. It may not cover all possible information. If you have questions about this medicine, talk to your doctor, pharmacist, or health care provider.  2020 Elsevier/Gold Standard (2013-08-29 14:19:39)    Leuprolide injection What is this medicine? LEUPROLIDE (loo PROE lide) is a man-made hormone. It is used to treat the symptoms of prostate cancer. This medicine may also be used to treat children with early onset of puberty. It may be used for other hormonal conditions. This medicine may be used for other purposes; ask your health care provider or pharmacist if you have questions. COMMON BRAND NAME(S): Lupron What should I tell my health care provider before I take this medicine? They need to know if you have any of these conditions:  diabetes  heart disease or previous heart attack  high blood pressure  high cholesterol  pain or difficulty passing urine  spinal cord metastasis  stroke  tobacco smoker  an unusual or allergic reaction to  leuprolide, benzyl alcohol, other medicines, foods, dyes, or preservatives  pregnant or trying to get pregnant  breast-feeding How should I use this medicine? This medicine is for injection under the skin or into a muscle. You will be taught how to prepare and give this medicine. Use exactly as directed. Take your medicine at regular intervals. Do not take your medicine more often than directed. It is important that you put your used needles and syringes in a special sharps container. Do not put them in a trash can. If you do not have a sharps container, call your pharmacist or healthcare provider to get one. A special MedGuide will be given to you by the pharmacist with each prescription and refill. Be sure to read this information carefully each time. Talk to your pediatrician regarding the use of this medicine in children. While this medicine may be prescribed for children as young as 8 years for selected conditions, precautions do apply. Overdosage: If you think you have taken too much of this medicine contact a poison control center or emergency room at once. NOTE: This medicine is only for you. Do not share this medicine with others. What if I miss a dose? If you miss a dose, take  it as soon as you can. If it is almost time for your next dose, take only that dose. Do not take double or extra doses. What may interact with this medicine? Do not take this medicine with any of the following medications:  chasteberry This medicine may also interact with the following medications:  herbal or dietary supplements, like black cohosh or DHEA  male hormones, like estrogens or progestins and birth control pills, patches, rings, or injections  male hormones, like testosterone This list may not describe all possible interactions. Give your health care provider a list of all the medicines, herbs, non-prescription drugs, or dietary supplements you use. Also tell them if you smoke, drink alcohol, or  use illegal drugs. Some items may interact with your medicine. What should I watch for while using this medicine? Visit your doctor or health care professional for regular checks on your progress. During the first week, your symptoms may get worse, but then will improve as you continue your treatment. You may get hot flashes, increased bone pain, increased difficulty passing urine, or an aggravation of nerve symptoms. Discuss these effects with your doctor or health care professional, some of them may improve with continued use of this medicine. Male patients may experience a menstrual cycle or spotting during the first 2 months of therapy with this medicine. If this continues, contact your doctor or health care professional. This medicine may increase blood sugar. Ask your healthcare provider if changes in diet or medicines are needed if you have diabetes. What side effects may I notice from receiving this medicine? Side effects that you should report to your doctor or health care professional as soon as possible:  allergic reactions like skin rash, itching or hives, swelling of the face, lips, or tongue  breathing problems  chest pain  depression or memory disorders  pain in your legs or groin  pain at site where injected  severe headache  signs and symptoms of high blood sugar such as being more thirsty or hungry or having to urinate more than normal. You may also feel very tired or have blurry vision  swelling of the feet and legs  visual changes  vomiting Side effects that usually do not require medical attention (report to your doctor or health care professional if they continue or are bothersome):  breast swelling or tenderness  decrease in sex drive or performance  diarrhea  hot flashes  loss of appetite  muscle, joint, or bone pains  nausea  redness or irritation at site where injected  skin problems or acne This list may not describe all possible side  effects. Call your doctor for medical advice about side effects. You may report side effects to FDA at 1-800-FDA-1088. Where should I keep my medicine? Keep out of the reach of children. Store below 25 degrees C (77 degrees F). Do not freeze. Protect from light. Do not use if it is not clear or if there are particles present. Throw away any unused medicine after the expiration date. NOTE: This sheet is a summary. It may not cover all possible information. If you have questions about this medicine, talk to your doctor, pharmacist, or health care provider.  2020 Elsevier/Gold Standard (2018-01-30 09:52:48)

## 2020-04-01 NOTE — Telephone Encounter (Signed)
-----   Message from Ladell Pier, MD sent at 04/01/2020  2:40 PM EST ----- Please call patient, platelet count is mildly low, likely from cirrhosis, return for repeat CBC and CMP in 2 weeks

## 2020-04-01 NOTE — Progress Notes (Signed)
[place] CSW Progress Notes  Patient enrolled in [place], first disbursement given to patient today to assist with food or gas.  Edwyna Shell, LCSW Clinical Social Worker Phone:  [phone]

## 2020-04-01 NOTE — Progress Notes (Signed)
[place] OFFICE PROGRESS NOTE   Diagnosis: Prostate cancer  INTERVAL HISTORY:   Edgar Mooney returns for a scheduled visit.  He is taking abiraterone and prednisone.  No rash, nausea, or diarrhea.  He continues to have malaise.  Sternum pain has improved.  He currently has a flare of "gout "at the right wrist.  He typically has gout flares in the feet.  He has been on allopurinol in the past, but is not taking this at present.  He reports no IV stick at the right forearm.  Objective:  Vital signs in last 24 hours:  Blood pressure 138/83, pulse 69, temperature 97.6 F (36.4 C), temperature source Tympanic, resp. rate 18, height 5\' 2"  (1.575 m), weight 150 lb 8 oz (68.3 kg), SpO2 100 %.    HEENT: No thrush or ulcers Resp: Lungs clear bilaterally Cardio: Regular rate and rhythm GI: No hepatosplenomegaly, no mass, nontender Vascular: No leg edema Musculoskeletal: No tenderness at sternum.  There is erythema at the lateral dorsal aspect of the right wrist joint over the ulnar head extending 3 to 4 cm proximally.  Pain with medial and lateral motion at the right wrist.  No palpable cord.  No arm swelling.    Lab Results:  Lab Results  Component Value Date   WBC 5.3 04/01/2020   HGB 9.8 (L) 04/01/2020   HCT 30.3 (L) 04/01/2020   MCV 91.5 04/01/2020   PLT 90 (L) 04/01/2020   NEUTROABS 3.2 04/01/2020    CMP  Lab Results  Component Value Date   NA 140 04/01/2020   K 4.3 04/01/2020   CL 107 04/01/2020   CO2 22 04/01/2020   GLUCOSE 108 (H) 04/01/2020   BUN 21 (H) 04/01/2020   CREATININE 0.84 04/01/2020   CALCIUM 9.4 04/01/2020   PROT 7.4 04/01/2020   ALBUMIN 3.9 04/01/2020   AST 11 (L) 04/01/2020   ALT 8 04/01/2020   ALKPHOS 431 (H) 04/01/2020   BILITOT 1.4 (H) 04/01/2020   GFRNONAA >60 04/01/2020   GFRAA 121 12/19/2017     Medications: I have reviewed the patient's current medications.   Assessment/Plan:  1.  Metastatic prostate cancer -Lytic  bone lesions with retroperitoneal adenopathy concerning for metastatic prostate cancer -03/03/2020-CTA chest/abdomen/pelvis widespread osseous metastatic disease and lower retroperitoneal adenopathy, pathologic right anterior third rib fracture, probable spinal canal tumor at the level of S1, -MRI cervical, thoracic, and lumbar spine 03/03/2020-diffuse osseous metastatic disease, ventral epidural tumor impinging on right S1 nerve root, extraosseous tumor at the bilateral ilium, asymmetric enhancing material at the right C5-6 canal-right  -03/03/2020-PSA 763 -Degarelix 03/04/2020 -Abiraterone/prednisone 03/14/2020 -Every 14-month Lupron 04/01/2020 2.Severe anemia-likely secondary to metastatic prostate cancer involving the bones 3. Mild thrombocytopenia 4. Cirrhosis with splenomegaly 5. History of hepatitis C 6. History of polysubstance abuse 7. Depression 8. Tobacco dependence 9.Right arm weakness, right facial numbness-potentially related to nerve root compromise from metastatic bone lesions 10.Pain secondary to #1 11.  Extensive bone metastases-every 19-month Zometa starting 04/01/2020 12.  Gout-acute flare right wrist 04/01/2020 treated with indomethacin, allopurinol resumed    Disposition: Mr. Weddington has metastatic prostate cancer.  He appears to be tolerating the abiraterone/prednisone well.  He will begin every 3 months Lupron today.  He has extensive bone metastases.  He will begin every 65-month Zometa today.  We discussed the potential for osteonecrosis, he agrees to proceed.  Mr. Newstrom appears to have an acute flare of gout at the right wrist.  He will complete a short  course of indomethacin and then resume allopurinol.  He will contact us at the gout flare does not improve over the next few days.  He will return for an office and lab visit in 1 month.  I refilled his prescription for hydrocodone.  Betsy Coder, MD  04/01/2020  9:16 AM

## 2020-04-02 LAB — PROSTATE-SPECIFIC AG, SERUM (LABCORP): Prostate Specific Ag, Serum: 36.5 ng/mL — ABNORMAL HIGH (ref 0.0–4.0)

## 2020-04-04 ENCOUNTER — Telehealth: Payer: Self-pay | Admitting: Oncology

## 2020-04-04 NOTE — Telephone Encounter (Signed)
Scheduled appointments per 12/17 los. Called patient, no answer. Left message with appointments date and times.  

## 2020-04-05 ENCOUNTER — Other Ambulatory Visit: Payer: Self-pay | Admitting: Nurse Practitioner

## 2020-04-05 ENCOUNTER — Telehealth: Payer: Self-pay | Admitting: *Deleted

## 2020-04-05 NOTE — Telephone Encounter (Addendum)
Received refill request on indomethacin that was ordered for gout flare right wrist on 04/01/20 for 5 days. Called and left VM requesting return call to discuss the status of his gout before refill can be approved. Also, per Dr. Benay Spice: PSA is improved--continue abiraterone and prednisone

## 2020-04-06 ENCOUNTER — Telehealth: Payer: Self-pay

## 2020-04-06 NOTE — Telephone Encounter (Signed)
Called spoke with pt, pt states he is feeling very good did not request Indocin refill and understand when to start allopurinol

## 2020-04-07 ENCOUNTER — Other Ambulatory Visit: Payer: Self-pay | Admitting: Oncology

## 2020-04-07 MED FILL — ABIRATERONE ACETATE 250 MG: 250 | 15 days supply | Qty: 60 | Fill #1

## 2020-04-11 ENCOUNTER — Telehealth: Payer: Self-pay | Admitting: Licensed Clinical Social Worker

## 2020-04-11 NOTE — Telephone Encounter (Signed)
[facility] Clinical Social Work   Pt left VM for Anne/Abigail stating he received a letter from disability that he thinks says he is not eligible. CSW returned call and advised pt to contact [facility] to check status as they helped pt apply. Pt voiced understanding and has the number for his contact Kelle at [facility].    Merlyn Albert, LCSW

## 2020-04-22 MED FILL — predniSONE 5 MG TABS: 5 | 30 days supply | Qty: 30 | Fill #1

## 2020-04-25 MED FILL — ABIRATERONE ACETATE 250 MG: 250 | 15 days supply | Qty: 60 | Fill #0

## 2020-04-27 ENCOUNTER — Encounter: Payer: Self-pay | Admitting: General Practice

## 2020-04-27 ENCOUNTER — Encounter: Payer: Self-pay | Admitting: Oncology

## 2020-04-27 NOTE — Progress Notes (Signed)
[place] CSW Progress Notes  Call from patient - says "I went up to get my medication refilled yesterday, I couldn't get them because they said its a new year and I have a deductible I need to meet."  Is not working, cannot afford to pay for medications.  Referred him to Red Christians, his Estate manager/land agent, so he can apply for [employer] if he has not already done so.  Edwyna Shell, LCSW Clinical Social Worker Phone:  [phone]

## 2020-04-27 NOTE — Progress Notes (Signed)
Patient called to inquire about [employer].  Advised patient to bring letter of support at visit tomorrow 04/28/20 and complete grant process. He verbalized understanding.  He has my card for any additional financial questions or concerns.

## 2020-04-28 ENCOUNTER — Inpatient Hospital Stay (HOSPITAL_BASED_OUTPATIENT_CLINIC_OR_DEPARTMENT_OTHER): Payer: BC Managed Care – PPO | Admitting: Nurse Practitioner

## 2020-04-28 ENCOUNTER — Encounter: Payer: Self-pay | Admitting: Nurse Practitioner

## 2020-04-28 ENCOUNTER — Other Ambulatory Visit: Payer: Self-pay

## 2020-04-28 ENCOUNTER — Inpatient Hospital Stay: Payer: BC Managed Care – PPO | Attending: Nurse Practitioner

## 2020-04-28 ENCOUNTER — Telehealth: Payer: Self-pay | Admitting: Oncology

## 2020-04-28 VITALS — BP 155/80 | HR 80 | Temp 97.8°F | Resp 18 | Ht 62.0 in | Wt 159.2 lb

## 2020-04-28 DIAGNOSIS — Z8619 Personal history of other infectious and parasitic diseases: Secondary | ICD-10-CM | POA: Insufficient documentation

## 2020-04-28 DIAGNOSIS — C61 Malignant neoplasm of prostate: Secondary | ICD-10-CM | POA: Diagnosis not present

## 2020-04-28 DIAGNOSIS — Z79899 Other long term (current) drug therapy: Secondary | ICD-10-CM | POA: Diagnosis not present

## 2020-04-28 DIAGNOSIS — C7951 Secondary malignant neoplasm of bone: Secondary | ICD-10-CM | POA: Diagnosis not present

## 2020-04-28 DIAGNOSIS — F32A Depression, unspecified: Secondary | ICD-10-CM | POA: Insufficient documentation

## 2020-04-28 DIAGNOSIS — D696 Thrombocytopenia, unspecified: Secondary | ICD-10-CM | POA: Insufficient documentation

## 2020-04-28 LAB — CBC WITH DIFFERENTIAL (CANCER CENTER ONLY)
Abs Immature Granulocytes: 0.03 10*3/uL (ref 0.00–0.07)
Basophils Absolute: 0.1 10*3/uL (ref 0.0–0.1)
Basophils Relative: 1 %
Eosinophils Absolute: 0.1 10*3/uL (ref 0.0–0.5)
Eosinophils Relative: 2 %
HCT: 29.4 % — ABNORMAL LOW (ref 39.0–52.0)
Hemoglobin: 10.1 g/dL — ABNORMAL LOW (ref 13.0–17.0)
Immature Granulocytes: 0 %
Lymphocytes Relative: 17 %
Lymphs Abs: 1.2 10*3/uL (ref 0.7–4.0)
MCH: 31.7 pg (ref 26.0–34.0)
MCHC: 34.4 g/dL (ref 30.0–36.0)
MCV: 92.2 fL (ref 80.0–100.0)
Monocytes Absolute: 0.6 10*3/uL (ref 0.1–1.0)
Monocytes Relative: 8 %
Neutro Abs: 5.2 10*3/uL (ref 1.7–7.7)
Neutrophils Relative %: 72 %
Platelet Count: 120 10*3/uL — ABNORMAL LOW (ref 150–400)
RBC: 3.19 MIL/uL — ABNORMAL LOW (ref 4.22–5.81)
RDW: 17.1 % — ABNORMAL HIGH (ref 11.5–15.5)
WBC Count: 7.3 10*3/uL (ref 4.0–10.5)
nRBC: 0 % (ref 0.0–0.2)

## 2020-04-28 LAB — CMP (CANCER CENTER ONLY)
ALT: 12 U/L (ref 0–44)
AST: 15 U/L (ref 15–41)
Albumin: 4.3 g/dL (ref 3.5–5.0)
Alkaline Phosphatase: 353 U/L — ABNORMAL HIGH (ref 38–126)
Anion gap: 10 (ref 5–15)
BUN: 11 mg/dL (ref 6–20)
CO2: 20 mmol/L — ABNORMAL LOW (ref 22–32)
Calcium: 8.8 mg/dL — ABNORMAL LOW (ref 8.9–10.3)
Chloride: 107 mmol/L (ref 98–111)
Creatinine: 0.7 mg/dL (ref 0.61–1.24)
GFR, Estimated: >60 mL/min (ref >60)
Glucose, Bld: 106 mg/dL — ABNORMAL HIGH (ref 70–99)
Potassium: 3.4 mmol/L — ABNORMAL LOW (ref 3.5–5.1)
Sodium: 137 mmol/L (ref 135–145)
Total Bilirubin: 2.1 mg/dL — ABNORMAL HIGH (ref 0.3–1.2)
Total Protein: 7.3 g/dL (ref 6.5–8.1)

## 2020-04-28 LAB — PHOSPHORUS: Phosphorus: 3.1 mg/dL (ref 2.5–4.6)

## 2020-04-28 NOTE — Telephone Encounter (Signed)
Scheduled appointments per 1/13 los. Spoke to patient who is aware of appointments dates and times. Gave patient AVS and updated calendar.

## 2020-04-28 NOTE — Progress Notes (Signed)
Met with patient at registration to to obtain documentation for grant.  Patient approved for one-time $1000 [employer] grant to assist with medication cost and an additional $200 while in treatment. He has a copy of the approval letter. Advised he may head to [facility] to pick up prescriptions after his appointment. He verbalized understanding and has my contact name and number for any additional financial questions or concerns.

## 2020-04-28 NOTE — Progress Notes (Signed)
  [place] OFFICE PROGRESS NOTE   Diagnosis: Prostate cancer  INTERVAL HISTORY:   Mr. Catalfamo returns as scheduled.  He ran out of both abiraterone and prednisone about 5 days ago.  He plans to pick up refills at his pharmacy today.  Pain is "100% better".  He has intermittent pain involving the knees and ankles.  He takes 2 hydrocodone tablets as needed.  He reports intermittent dark stools.  He is taking oral iron.  Main complaints today are poor appetite and poor energy level.  Objective:  Vital signs in last 24 hours:  Blood pressure (!) 155/80, pulse 80, temperature 97.8 F (36.6 C), temperature source Tympanic, resp. rate 18, height 5\' 2"  (1.575 m), weight 159 lb 3.2 oz (72.2 kg), SpO2 100 %.    Resp: Lungs clear bilaterally. Cardio: Regular rate and rhythm. GI: No hepatosplenomegaly. Vascular: No leg edema.   Lab Results:  Lab Results  Component Value Date   WBC 7.3 04/28/2020   HGB 10.1 (L) 04/28/2020   HCT 29.4 (L) 04/28/2020   MCV 92.2 04/28/2020   PLT 120 (L) 04/28/2020   NEUTROABS 5.2 04/28/2020    Imaging:  No results found.  Medications: I have reviewed the patient's current medications.  Assessment/Plan: 1. Metastatic prostate cancer -Lytic bone lesions with retroperitoneal adenopathy concerning for metastatic prostate cancer -03/03/2020-CTA chest/abdomen/pelvis widespread osseous metastatic disease and lower retroperitoneal adenopathy, pathologic right anterior third rib fracture, probable spinal canal tumor at the level of S1, -MRI cervical, thoracic, and lumbar spine 03/03/2020-diffuse osseous metastatic disease, ventral epidural tumor impinging on right S1 nerve root, extraosseous tumor at the bilateral ilium, asymmetric enhancing material at the right C5-6 canal-right  -03/03/2020-PSA 763 -Degarelix 03/04/2020 -Abiraterone/prednisone 03/14/2020 -Every 27-month Lupron 04/01/2020 -04/01/2020 PSA 36.5 2.Severe anemia-likely  secondary to metastatic prostate cancer involving the bones 3. Mild thrombocytopenia 4. Cirrhosis with splenomegaly 5. History of hepatitis C 6. History of polysubstance abuse 7. Depression 8. Tobacco dependence 9.Right arm weakness, right facial numbness-potentially related to nerve root compromise from metastatic bone lesions 10.Pain secondary to #1 11.  Extensive bone metastases-every 42-month Zometa starting 04/01/2020 12.  Gout-acute flare right wrist 04/01/2020 treated with indomethacin, allopurinol resumed   Disposition: Mr. Cammarata appears stable.  PSA from last month was significantly improved.  He will continue abiraterone and prednisone.  He receives Lupron every 3 months, most recent injection 04/01/2020.  Also receives Zofran every 3 months, most recent 04/01/2020.  The dark stools are likely related to oral iron.  Hemoglobin is stable to slightly improved.  His pain overall is much better.  He reports taking hydrocodone for knee pain.  I encouraged him to try Tylenol.  He will return for lab and follow-up in 1 month.  We are available to see him sooner if needed.   Ned Card ANP/GNP-BC   04/28/2020  2:44 PM

## 2020-04-29 LAB — PROSTATE-SPECIFIC AG, SERUM (LABCORP): Prostate Specific Ag, Serum: 3.6 ng/mL (ref 0.0–4.0)

## 2020-05-04 ENCOUNTER — Other Ambulatory Visit: Payer: Self-pay | Admitting: Oncology

## 2020-05-05 ENCOUNTER — Other Ambulatory Visit: Payer: Self-pay | Admitting: Oncology

## 2020-05-05 MED FILL — ABIRATERONE ACETATE 250 MG: 250 | 15 days supply | Qty: 60 | Fill #0

## 2020-05-13 ENCOUNTER — Other Ambulatory Visit: Payer: Self-pay | Admitting: Nurse Practitioner

## 2020-05-13 ENCOUNTER — Telehealth: Payer: Self-pay

## 2020-05-13 DIAGNOSIS — C61 Malignant neoplasm of prostate: Secondary | ICD-10-CM

## 2020-05-13 MED ORDER — TAMSULOSIN HCL 0.4 MG PO CAPS: 0.4000 mg | ORAL_CAPSULE | Freq: Every day | ORAL | 1 refills | Status: DC

## 2020-05-13 MED ORDER — HYDROCODONE-ACETAMINOPHEN 5-325 MG PO TABS: 1.0000 | ORAL_TABLET | Freq: Four times a day (QID) | ORAL | 0 refills | Status: DC | PRN

## 2020-05-13 NOTE — Telephone Encounter (Signed)
Contacted pt to verify correct pharmacy for flomax refill. Pt also requested a refill for Norco. Will review with provider. Flomax refill sent in.

## 2020-05-23 MED FILL — predniSONE 5 MG TABS: 5 | 30 days supply | Qty: 30 | Fill #2

## 2020-05-25 ENCOUNTER — Encounter: Payer: Self-pay | Admitting: General Practice

## 2020-05-25 NOTE — Progress Notes (Signed)
[place] CSW Progress Notes  Call from patient, he has "questions about my SSI application."  Advised that [place] CSWs refer to [facility] - patient was referred to this agency who assisted him w his application.  He was advised to call [facility] with any questions/concerns.  Edwyna Shell, LCSW Clinical Social Worker Phone:  [phone]

## 2020-05-26 ENCOUNTER — Other Ambulatory Visit: Payer: Self-pay | Admitting: Oncology

## 2020-05-27 ENCOUNTER — Other Ambulatory Visit: Payer: Self-pay | Admitting: Oncology

## 2020-05-31 ENCOUNTER — Inpatient Hospital Stay: Payer: BC Managed Care – PPO | Attending: Nurse Practitioner

## 2020-05-31 ENCOUNTER — Other Ambulatory Visit: Payer: Self-pay

## 2020-05-31 ENCOUNTER — Inpatient Hospital Stay (HOSPITAL_BASED_OUTPATIENT_CLINIC_OR_DEPARTMENT_OTHER): Payer: BC Managed Care – PPO | Admitting: Oncology

## 2020-05-31 VITALS — BP 148/88 | HR 83 | Temp 97.8°F | Resp 16 | Ht 62.0 in | Wt 162.0 lb

## 2020-05-31 DIAGNOSIS — G893 Neoplasm related pain (acute) (chronic): Secondary | ICD-10-CM | POA: Insufficient documentation

## 2020-05-31 DIAGNOSIS — C61 Malignant neoplasm of prostate: Secondary | ICD-10-CM | POA: Diagnosis not present

## 2020-05-31 DIAGNOSIS — Z79899 Other long term (current) drug therapy: Secondary | ICD-10-CM | POA: Diagnosis not present

## 2020-05-31 DIAGNOSIS — Z7952 Long term (current) use of systemic steroids: Secondary | ICD-10-CM | POA: Diagnosis not present

## 2020-05-31 DIAGNOSIS — C7951 Secondary malignant neoplasm of bone: Secondary | ICD-10-CM | POA: Insufficient documentation

## 2020-05-31 LAB — CBC WITH DIFFERENTIAL (CANCER CENTER ONLY)
Abs Immature Granulocytes: 0.01 10*3/uL (ref 0.00–0.07)
Basophils Absolute: 0 10*3/uL (ref 0.0–0.1)
Basophils Relative: 1 %
Eosinophils Absolute: 0.2 10*3/uL (ref 0.0–0.5)
Eosinophils Relative: 3 %
HCT: 34.7 % — ABNORMAL LOW (ref 39.0–52.0)
Hemoglobin: 11.9 g/dL — ABNORMAL LOW (ref 13.0–17.0)
Immature Granulocytes: 0 %
Lymphocytes Relative: 16 %
Lymphs Abs: 1.2 10*3/uL (ref 0.7–4.0)
MCH: 33 pg (ref 26.0–34.0)
MCHC: 34.3 g/dL (ref 30.0–36.0)
MCV: 96.1 fL (ref 80.0–100.0)
Monocytes Absolute: 0.4 10*3/uL (ref 0.1–1.0)
Monocytes Relative: 6 %
Neutro Abs: 5.3 10*3/uL (ref 1.7–7.7)
Neutrophils Relative %: 74 %
Platelet Count: 168 10*3/uL (ref 150–400)
RBC: 3.61 MIL/uL — ABNORMAL LOW (ref 4.22–5.81)
RDW: 13 % (ref 11.5–15.5)
WBC Count: 7.1 10*3/uL (ref 4.0–10.5)
nRBC: 0 % (ref 0.0–0.2)

## 2020-05-31 LAB — CMP (CANCER CENTER ONLY)
ALT: 18 U/L (ref 0–44)
AST: 16 U/L (ref 15–41)
Albumin: 4.1 g/dL (ref 3.5–5.0)
Alkaline Phosphatase: 200 U/L — ABNORMAL HIGH (ref 38–126)
Anion gap: 8 (ref 5–15)
BUN: 16 mg/dL (ref 6–20)
CO2: 20 mmol/L — ABNORMAL LOW (ref 22–32)
Calcium: 9 mg/dL (ref 8.9–10.3)
Chloride: 112 mmol/L — ABNORMAL HIGH (ref 98–111)
Creatinine: 0.8 mg/dL (ref 0.61–1.24)
GFR, Estimated: >60 mL/min (ref >60)
Glucose, Bld: 113 mg/dL — ABNORMAL HIGH (ref 70–99)
Potassium: 3.8 mmol/L (ref 3.5–5.1)
Sodium: 140 mmol/L (ref 135–145)
Total Bilirubin: 0.7 mg/dL (ref 0.3–1.2)
Total Protein: 7.1 g/dL (ref 6.5–8.1)

## 2020-05-31 LAB — PHOSPHORUS: Phosphorus: 3.7 mg/dL (ref 2.5–4.6)

## 2020-05-31 LAB — TSH: TSH: 1.142 u[IU]/mL (ref 0.320–4.118)

## 2020-05-31 MED ORDER — HYDROCODONE-ACETAMINOPHEN 5-325 MG PO TABS: 1.0000 | ORAL_TABLET | Freq: Four times a day (QID) | ORAL | 0 refills | Status: DC | PRN

## 2020-05-31 NOTE — Progress Notes (Signed)
  [place] OFFICE PROGRESS NOTE   Diagnosis: Prostate cancer  INTERVAL HISTORY:   Mr. No returns as scheduled. He continues abiraterone and prednisone. He reports a good appetite. Pain has improved. He takes hydrocodone intermittently for discomfort at the anterior chest. No hot flashes.  Objective:  Vital signs in last 24 hours:  Blood pressure (!) 148/88, pulse 83, temperature 97.8 F (36.6 C), temperature source Tympanic, resp. rate 16, height 5\' 2"  (1.575 m), weight 162 lb (73.5 kg), SpO2 100 %.    HEENT: No thrush Resp: Lungs clear bilaterally Cardio: Regular rate and rhythm GI: No hepatosplenomegaly Vascular: No leg edema    Portacath/PICC-without erythema  Lab Results:  Lab Results  Component Value Date   WBC 7.1 05/31/2020   HGB 11.9 (L) 05/31/2020   HCT 34.7 (L) 05/31/2020   MCV 96.1 05/31/2020   PLT 168 05/31/2020   NEUTROABS 5.3 05/31/2020    CMP  Lab Results  Component Value Date   NA 140 05/31/2020   K 3.8 05/31/2020   CL 112 (H) 05/31/2020   CO2 20 (L) 05/31/2020   GLUCOSE 113 (H) 05/31/2020   BUN 16 05/31/2020   CREATININE 0.80 05/31/2020   CALCIUM 9.0 05/31/2020   PROT 7.1 05/31/2020   ALBUMIN 4.1 05/31/2020   AST 16 05/31/2020   ALT 18 05/31/2020   ALKPHOS 200 (H) 05/31/2020   BILITOT 0.7 05/31/2020   GFRNONAA >60 05/31/2020   GFRAA 121 12/19/2017     Medications: I have reviewed the patient's current medications.   Assessment/Plan: 1. Metastatic prostate cancer -Lytic bone lesions with retroperitoneal adenopathy concerning for metastatic prostate cancer -03/03/2020-CTA chest/abdomen/pelvis widespread osseous metastatic disease and lower retroperitoneal adenopathy, pathologic right anterior third rib fracture, probable spinal canal tumor at the level of S1, -MRI cervical, thoracic, and lumbar spine 03/03/2020-diffuse osseous metastatic disease, ventral epidural tumor impinging on right S1 nerve root,  extraosseous tumor at the bilateral ilium, asymmetric enhancing material at the right C5-6 canal-right  -03/03/2020-PSA 763 -Degarelix 03/04/2020 -Abiraterone/prednisone 03/14/2020 -Every 39-month Lupron 04/01/2020 -04/01/2020 PSA 36.5 2.Severe anemia-likely secondary to metastatic prostate cancer involving the bones 3. Mild thrombocytopenia 4. Cirrhosis with splenomegaly 5. History of hepatitis C 6. History of polysubstance abuse 7. Depression 8. Tobacco dependence 9.Right arm weakness, right facial numbness-potentially related to nerve root compromise from metastatic bone lesions 10.Pain secondary to #1 11.  Extensive bone metastases-every 79-month Zometa starting 04/01/2020 12.  Gout-acute flare right wrist 04/01/2020 treated with indomethacin, allopurinol resumed     Disposition: Mr. Pelzer appears well. He continues treatment with abiraterone/prednisone. He will be due for Lupron in 1 month.  He has gained weight and the PSA has normalized. The hemoglobin is much improved.  I refilled his prescription for hydrocodone. We will plan to decrease the hydrocodone prescription when he returns next month.  He will return for an office visit, Zometa, and Lupron in 1 month.  Betsy Coder, MD  05/31/2020  3:28 PM

## 2020-06-01 LAB — PROSTATE-SPECIFIC AG, SERUM (LABCORP): Prostate Specific Ag, Serum: 1.2 ng/mL (ref 0.0–4.0)

## 2020-06-01 MED FILL — ABIRATERONE ACETATE 250 MG: 250 | 30 days supply | Qty: 120 | Fill #0

## 2020-06-07 ENCOUNTER — Other Ambulatory Visit: Payer: Self-pay | Admitting: Nurse Practitioner

## 2020-06-07 ENCOUNTER — Other Ambulatory Visit: Payer: Self-pay | Admitting: Oncology

## 2020-06-24 MED FILL — predniSONE 5 MG TABS: 5 | 30 days supply | Qty: 30 | Fill #3

## 2020-06-28 ENCOUNTER — Inpatient Hospital Stay: Payer: BC Managed Care – PPO

## 2020-06-28 ENCOUNTER — Telehealth: Payer: Self-pay | Admitting: Oncology

## 2020-06-28 ENCOUNTER — Other Ambulatory Visit: Payer: Self-pay

## 2020-06-28 ENCOUNTER — Inpatient Hospital Stay (HOSPITAL_BASED_OUTPATIENT_CLINIC_OR_DEPARTMENT_OTHER): Payer: BC Managed Care – PPO | Admitting: Oncology

## 2020-06-28 ENCOUNTER — Inpatient Hospital Stay: Payer: BC Managed Care – PPO | Attending: Nurse Practitioner

## 2020-06-28 VITALS — BP 167/75 | HR 72 | Temp 98.1°F | Resp 17 | Ht 62.0 in | Wt 171.6 lb

## 2020-06-28 DIAGNOSIS — Z79818 Long term (current) use of other agents affecting estrogen receptors and estrogen levels: Secondary | ICD-10-CM | POA: Insufficient documentation

## 2020-06-28 DIAGNOSIS — C61 Malignant neoplasm of prostate: Secondary | ICD-10-CM | POA: Insufficient documentation

## 2020-06-28 DIAGNOSIS — C7951 Secondary malignant neoplasm of bone: Secondary | ICD-10-CM | POA: Diagnosis not present

## 2020-06-28 DIAGNOSIS — Z79891 Long term (current) use of opiate analgesic: Secondary | ICD-10-CM | POA: Diagnosis not present

## 2020-06-28 DIAGNOSIS — F32A Depression, unspecified: Secondary | ICD-10-CM | POA: Insufficient documentation

## 2020-06-28 DIAGNOSIS — F1911 Other psychoactive substance abuse, in remission: Secondary | ICD-10-CM | POA: Diagnosis not present

## 2020-06-28 DIAGNOSIS — Z79899 Other long term (current) drug therapy: Secondary | ICD-10-CM | POA: Insufficient documentation

## 2020-06-28 DIAGNOSIS — D696 Thrombocytopenia, unspecified: Secondary | ICD-10-CM | POA: Insufficient documentation

## 2020-06-28 DIAGNOSIS — Z8619 Personal history of other infectious and parasitic diseases: Secondary | ICD-10-CM | POA: Insufficient documentation

## 2020-06-28 DIAGNOSIS — K7460 Unspecified cirrhosis of liver: Secondary | ICD-10-CM | POA: Insufficient documentation

## 2020-06-28 DIAGNOSIS — Z7952 Long term (current) use of systemic steroids: Secondary | ICD-10-CM | POA: Insufficient documentation

## 2020-06-28 DIAGNOSIS — G893 Neoplasm related pain (acute) (chronic): Secondary | ICD-10-CM | POA: Diagnosis not present

## 2020-06-28 LAB — CMP (CANCER CENTER ONLY)
ALT: 18 U/L (ref 0–44)
AST: 18 U/L (ref 15–41)
Albumin: 4 g/dL (ref 3.5–5.0)
Alkaline Phosphatase: 171 U/L — ABNORMAL HIGH (ref 38–126)
Anion gap: 10 (ref 5–15)
BUN: 15 mg/dL (ref 6–20)
CO2: 18 mmol/L — ABNORMAL LOW (ref 22–32)
Calcium: 8.9 mg/dL (ref 8.9–10.3)
Chloride: 110 mmol/L (ref 98–111)
Creatinine: 0.75 mg/dL (ref 0.61–1.24)
GFR, Estimated: >60 mL/min (ref >60)
Glucose, Bld: 95 mg/dL (ref 70–99)
Potassium: 3.9 mmol/L (ref 3.5–5.1)
Sodium: 138 mmol/L (ref 135–145)
Total Bilirubin: 0.7 mg/dL (ref 0.3–1.2)
Total Protein: 7 g/dL (ref 6.5–8.1)

## 2020-06-28 LAB — CBC WITH DIFFERENTIAL (CANCER CENTER ONLY)
Abs Immature Granulocytes: 0.01 10*3/uL (ref 0.00–0.07)
Basophils Absolute: 0.1 10*3/uL (ref 0.0–0.1)
Basophils Relative: 1 %
Eosinophils Absolute: 0.2 10*3/uL (ref 0.0–0.5)
Eosinophils Relative: 3 %
HCT: 36.8 % — ABNORMAL LOW (ref 39.0–52.0)
Hemoglobin: 12.9 g/dL — ABNORMAL LOW (ref 13.0–17.0)
Immature Granulocytes: 0 %
Lymphocytes Relative: 22 %
Lymphs Abs: 1.6 10*3/uL (ref 0.7–4.0)
MCH: 31.8 pg (ref 26.0–34.0)
MCHC: 35.1 g/dL (ref 30.0–36.0)
MCV: 90.6 fL (ref 80.0–100.0)
Monocytes Absolute: 0.7 10*3/uL (ref 0.1–1.0)
Monocytes Relative: 9 %
Neutro Abs: 5.1 10*3/uL (ref 1.7–7.7)
Neutrophils Relative %: 65 %
Platelet Count: 162 10*3/uL (ref 150–400)
RBC: 4.06 MIL/uL — ABNORMAL LOW (ref 4.22–5.81)
RDW: 11.8 % (ref 11.5–15.5)
WBC Count: 7.6 10*3/uL (ref 4.0–10.5)
nRBC: 0 % (ref 0.0–0.2)

## 2020-06-28 MED ORDER — LEUPROLIDE ACETATE (3 MONTH) 22.5 MG ~~LOC~~ KIT
PACK | SUBCUTANEOUS | Status: AC
  Filled 2020-06-28: qty 22.5

## 2020-06-28 MED ORDER — ZOLEDRONIC ACID 4 MG/100ML IV SOLN
INTRAVENOUS | Status: AC
  Filled 2020-06-28: qty 100

## 2020-06-28 MED ORDER — ZOLEDRONIC ACID 4 MG/100ML IV SOLN
4.0000 mg | Freq: Once | INTRAVENOUS | Status: AC
  Administered 2020-06-28: 4 mg via INTRAVENOUS

## 2020-06-28 MED ORDER — HYDROCODONE-ACETAMINOPHEN 5-325 MG PO TABS: 1.0000 | ORAL_TABLET | Freq: Every day | ORAL | 0 refills | Status: DC | PRN

## 2020-06-28 MED ORDER — LEUPROLIDE ACETATE (3 MONTH) 22.5 MG ~~LOC~~ KIT
22.5000 mg | PACK | Freq: Once | SUBCUTANEOUS | Status: AC
Start: 2020-06-28 — End: 2020-06-28
  Administered 2020-06-28: 22.5 mg via SUBCUTANEOUS

## 2020-06-28 MED ORDER — SODIUM CHLORIDE 0.9 % IV SOLN
Freq: Once | INTRAVENOUS | Status: AC
  Filled 2020-06-28: qty 250

## 2020-06-28 NOTE — Telephone Encounter (Signed)
Scheduled apt per 3/15 los- pt is aware of appt date and time

## 2020-06-28 NOTE — Progress Notes (Signed)
  [place] OFFICE PROGRESS NOTE   Diagnosis: Prostate cancer  INTERVAL HISTORY:   Mr. Bachar returns as scheduled.  He continues abiraterone and prednisone.  He has discomfort at the sternum, but this has improved.  He takes hydrocodone occasionally.  He requests a refill on hydrocodone.  He has malaise.  No other complaint.  He continues smoking.  Objective:  Vital signs in last 24 hours:  Blood pressure (!) 167/75, pulse 72, temperature 98.1 F (36.7 C), temperature source Tympanic, resp. rate 17, height 5\' 2"  (1.575 m), weight 171 lb 9.6 oz (77.8 kg), SpO2 100 %.    HEENT: No thrush Resp: Lungs clear bilaterally Cardio: Regular rate and rhythm GI: No hepatosplenomegaly, no mass, nontender Vascular: No leg edema Musculoskeletal: Mild tenderness at the sternum    Lab Results:  Lab Results  Component Value Date   WBC 7.6 06/28/2020   HGB 12.9 (L) 06/28/2020   HCT 36.8 (L) 06/28/2020   MCV 90.6 06/28/2020   PLT 162 06/28/2020   NEUTROABS 5.1 06/28/2020    CMP  Lab Results  Component Value Date   NA 138 06/28/2020   K 3.9 06/28/2020   CL 110 06/28/2020   CO2 18 (L) 06/28/2020   GLUCOSE 95 06/28/2020   BUN 15 06/28/2020   CREATININE 0.75 06/28/2020   CALCIUM 8.9 06/28/2020   PROT 7.0 06/28/2020   ALBUMIN 4.0 06/28/2020   AST 18 06/28/2020   ALT 18 06/28/2020   ALKPHOS 171 (H) 06/28/2020   BILITOT 0.7 06/28/2020   GFRNONAA >60 06/28/2020   GFRAA 121 12/19/2017    Medications: I have reviewed the patient's current medications.   Assessment/Plan:  1. Metastatic prostate cancer -Lytic bone lesions with retroperitoneal adenopathy concerning for metastatic prostate cancer -03/03/2020-CTA chest/abdomen/pelvis widespread osseous metastatic disease and lower retroperitoneal adenopathy, pathologic right anterior third rib fracture, probable spinal canal tumor at the level of S1, -MRI cervical, thoracic, and lumbar spine 03/03/2020-diffuse  osseous metastatic disease, ventral epidural tumor impinging on right S1 nerve root, extraosseous tumor at the bilateral ilium, asymmetric enhancing material at the right C5-6 canal-right  -03/03/2020-PSA 763 -Degarelix 03/04/2020 -Abiraterone/prednisone 03/14/2020 -Every 86-month Lupron 04/01/2020 -04/01/2020 PSA 36.5 -05/31/2020 PSA 1.2 2.Severe anemia-likely secondary to metastatic prostate cancer involving the bones 3. Mild thrombocytopenia 4. Cirrhosis with splenomegaly 5. History of hepatitis C 6. History of polysubstance abuse 7. Depression 8. Tobacco dependence 9.Right arm weakness, right facial numbness-potentially related to nerve root compromise from metastatic bone lesions 10.Pain secondary to #1 11.  Extensive bone metastases-every 33-month Zometa starting 04/01/2020 12.  Gout-acute flare right wrist 04/01/2020 treated with indomethacin, allopurinol resumed     Disposition: [name] has metastatic prostate cancer.  His clinical status, the PSA, and hemoglobin have improved with antiandrogen therapy.  He will continue abiraterone/prednisone.  He continues every 59-month Lupron.  He is maintained on every 68-month Zometa.  Mr. Crosson will return for an office and lab visit in 3 months.  I refilled his prescription for hydrocodone.  The plan is to wean the hydrocodone to off over the next few months.  He can use Tylenol or a nonsteroidal anti-inflammatory medication as needed for pain.  Betsy Coder, MD  06/28/2020  9:21 AM

## 2020-06-29 LAB — PROSTATE-SPECIFIC AG, SERUM (LABCORP): Prostate Specific Ag, Serum: 1 ng/mL (ref 0.0–4.0)

## 2020-07-01 ENCOUNTER — Telehealth: Payer: Self-pay | Admitting: *Deleted

## 2020-07-01 ENCOUNTER — Other Ambulatory Visit: Payer: Self-pay | Admitting: Nurse Practitioner

## 2020-07-01 DIAGNOSIS — C61 Malignant neoplasm of prostate: Secondary | ICD-10-CM

## 2020-07-01 MED ORDER — HYDROCODONE-ACETAMINOPHEN 5-325 MG PO TABS: 1.0000 | ORAL_TABLET | Freq: Every day | ORAL | 0 refills | Status: DC | PRN

## 2020-07-01 NOTE — Telephone Encounter (Signed)
[facility] from [facility] that [facility] 5/[facility] and has been almost 1 month and they do not know when it will be in supply. They have cancelled the script. Was informed that [facility] has the medication. Called patient and left VM requesting he provide an alternate pharmacy for his refill.

## 2020-07-01 NOTE — Telephone Encounter (Signed)
Patient called back and requested script be sent to [facility] on [address].

## 2020-07-04 ENCOUNTER — Other Ambulatory Visit: Payer: Self-pay | Admitting: Oncology

## 2020-07-05 MED FILL — ABIRATERONE ACETATE 250 MG: 250 | 30 days supply | Qty: 120 | Fill #0

## 2020-07-12 ENCOUNTER — Other Ambulatory Visit (HOSPITAL_COMMUNITY): Payer: Self-pay

## 2020-07-27 ENCOUNTER — Other Ambulatory Visit (HOSPITAL_COMMUNITY): Payer: Self-pay

## 2020-07-27 ENCOUNTER — Encounter: Payer: Self-pay | Admitting: *Deleted

## 2020-07-27 ENCOUNTER — Other Ambulatory Visit: Payer: Self-pay | Admitting: Oncology

## 2020-07-27 MED ORDER — ABIRATERONE ACETATE 250 MG PO TABS
ORAL_TABLET | Freq: Every day | ORAL | 0 refills | Status: DC
  Filled 2020-07-27 (×2): qty 120, 30d supply, fill #0

## 2020-07-27 MED FILL — Prednisone Tab 5 MG: ORAL | 30 days supply | Qty: 30 | Fill #0 | Status: AC

## 2020-07-27 NOTE — Progress Notes (Signed)
[place] Work  Clinical Social Work received phone call from patient requesting housing resources.  Patient stated his currently housing situation was changing and he would need to find alternative housing in the next 2 weeks.  CSW and patient discussed housing resources in the community and patient agreed to [place] making referrals through [facility].  CSW made referrals to [place].  CSW also encouraged patient to contact the [place] housing authority/[address] housing and the [employer].  Patient plans to contact CSW with questions and/or updates.    Johnnye Lana, MSW, LCSW, OSW-C Clinical Social Worker [facility] [phone]

## 2020-07-28 ENCOUNTER — Other Ambulatory Visit (HOSPITAL_COMMUNITY): Payer: Self-pay

## 2020-08-26 ENCOUNTER — Other Ambulatory Visit (HOSPITAL_COMMUNITY): Payer: Self-pay

## 2020-08-29 ENCOUNTER — Other Ambulatory Visit (HOSPITAL_COMMUNITY): Payer: Self-pay

## 2020-09-03 ENCOUNTER — Other Ambulatory Visit: Payer: Self-pay | Admitting: Oncology

## 2020-09-05 ENCOUNTER — Encounter: Payer: Self-pay | Admitting: Oncology

## 2020-09-13 ENCOUNTER — Other Ambulatory Visit: Payer: Self-pay | Admitting: Oncology

## 2020-09-13 ENCOUNTER — Other Ambulatory Visit (HOSPITAL_COMMUNITY): Payer: Self-pay

## 2020-09-13 MED ORDER — ABIRATERONE ACETATE 250 MG PO TABS
ORAL_TABLET | Freq: Every day | ORAL | 0 refills | Status: DC
Start: 2020-09-13 — End: 2020-10-24
  Filled 2020-09-13: qty 120, 30d supply, fill #0

## 2020-09-13 MED FILL — Prednisone Tab 5 MG: ORAL | 30 days supply | Qty: 30 | Fill #1 | Status: AC

## 2020-09-14 ENCOUNTER — Other Ambulatory Visit (HOSPITAL_COMMUNITY): Payer: Self-pay

## 2020-09-26 ENCOUNTER — Other Ambulatory Visit (HOSPITAL_COMMUNITY): Payer: Self-pay

## 2020-09-28 ENCOUNTER — Inpatient Hospital Stay: Payer: BC Managed Care – PPO | Admitting: Oncology

## 2020-09-28 ENCOUNTER — Inpatient Hospital Stay: Payer: BC Managed Care – PPO

## 2020-09-28 ENCOUNTER — Inpatient Hospital Stay: Payer: BC Managed Care – PPO | Attending: Nurse Practitioner

## 2020-09-28 DIAGNOSIS — C7951 Secondary malignant neoplasm of bone: Secondary | ICD-10-CM | POA: Insufficient documentation

## 2020-09-28 DIAGNOSIS — D696 Thrombocytopenia, unspecified: Secondary | ICD-10-CM | POA: Insufficient documentation

## 2020-09-28 DIAGNOSIS — F32A Depression, unspecified: Secondary | ICD-10-CM | POA: Insufficient documentation

## 2020-09-28 DIAGNOSIS — Z8619 Personal history of other infectious and parasitic diseases: Secondary | ICD-10-CM | POA: Insufficient documentation

## 2020-09-28 DIAGNOSIS — F17210 Nicotine dependence, cigarettes, uncomplicated: Secondary | ICD-10-CM | POA: Insufficient documentation

## 2020-09-28 DIAGNOSIS — C61 Malignant neoplasm of prostate: Secondary | ICD-10-CM | POA: Insufficient documentation

## 2020-09-28 DIAGNOSIS — K7460 Unspecified cirrhosis of liver: Secondary | ICD-10-CM | POA: Insufficient documentation

## 2020-09-28 DIAGNOSIS — Z79899 Other long term (current) drug therapy: Secondary | ICD-10-CM | POA: Insufficient documentation

## 2020-10-03 ENCOUNTER — Inpatient Hospital Stay: Payer: BC Managed Care – PPO

## 2020-10-03 ENCOUNTER — Inpatient Hospital Stay (HOSPITAL_BASED_OUTPATIENT_CLINIC_OR_DEPARTMENT_OTHER): Payer: BC Managed Care – PPO | Admitting: Oncology

## 2020-10-03 ENCOUNTER — Other Ambulatory Visit: Payer: Self-pay

## 2020-10-03 VITALS — BP 145/79 | HR 72 | Temp 98.4°F | Resp 18 | Ht 62.0 in | Wt 165.0 lb

## 2020-10-03 DIAGNOSIS — D696 Thrombocytopenia, unspecified: Secondary | ICD-10-CM | POA: Diagnosis not present

## 2020-10-03 DIAGNOSIS — F17210 Nicotine dependence, cigarettes, uncomplicated: Secondary | ICD-10-CM | POA: Diagnosis not present

## 2020-10-03 DIAGNOSIS — C61 Malignant neoplasm of prostate: Secondary | ICD-10-CM | POA: Diagnosis not present

## 2020-10-03 DIAGNOSIS — Z8619 Personal history of other infectious and parasitic diseases: Secondary | ICD-10-CM | POA: Diagnosis not present

## 2020-10-03 DIAGNOSIS — Z79899 Other long term (current) drug therapy: Secondary | ICD-10-CM | POA: Diagnosis not present

## 2020-10-03 DIAGNOSIS — K7460 Unspecified cirrhosis of liver: Secondary | ICD-10-CM | POA: Diagnosis not present

## 2020-10-03 DIAGNOSIS — C7951 Secondary malignant neoplasm of bone: Secondary | ICD-10-CM | POA: Diagnosis not present

## 2020-10-03 DIAGNOSIS — F32A Depression, unspecified: Secondary | ICD-10-CM | POA: Diagnosis not present

## 2020-10-03 LAB — CBC WITH DIFFERENTIAL (CANCER CENTER ONLY)
Abs Immature Granulocytes: 0.03 10*3/uL (ref 0.00–0.07)
Basophils Absolute: 0.1 10*3/uL (ref 0.0–0.1)
Basophils Relative: 1 %
Eosinophils Absolute: 0.3 10*3/uL (ref 0.0–0.5)
Eosinophils Relative: 4 %
HCT: 40.4 % (ref 39.0–52.0)
Hemoglobin: 14.2 g/dL (ref 13.0–17.0)
Immature Granulocytes: 0 %
Lymphocytes Relative: 26 %
Lymphs Abs: 1.8 10*3/uL (ref 0.7–4.0)
MCH: 31.8 pg (ref 26.0–34.0)
MCHC: 35.1 g/dL (ref 30.0–36.0)
MCV: 90.4 fL (ref 80.0–100.0)
Monocytes Absolute: 0.6 10*3/uL (ref 0.1–1.0)
Monocytes Relative: 9 %
Neutro Abs: 4 10*3/uL (ref 1.7–7.7)
Neutrophils Relative %: 60 %
Platelet Count: 185 10*3/uL (ref 150–400)
RBC: 4.47 MIL/uL (ref 4.22–5.81)
RDW: 12.8 % (ref 11.5–15.5)
WBC Count: 6.7 10*3/uL (ref 4.0–10.5)
nRBC: 0 % (ref 0.0–0.2)

## 2020-10-03 LAB — CMP (CANCER CENTER ONLY)
ALT: 52 U/L — ABNORMAL HIGH (ref 0–44)
AST: 40 U/L (ref 15–41)
Albumin: 4.3 g/dL (ref 3.5–5.0)
Alkaline Phosphatase: 83 U/L (ref 38–126)
Anion gap: 12 (ref 5–15)
BUN: 22 mg/dL — ABNORMAL HIGH (ref 6–20)
CO2: 21 mmol/L — ABNORMAL LOW (ref 22–32)
Calcium: 9.9 mg/dL (ref 8.9–10.3)
Chloride: 106 mmol/L (ref 98–111)
Creatinine: 0.73 mg/dL (ref 0.61–1.24)
GFR, Estimated: >60 mL/min (ref >60)
Glucose, Bld: 91 mg/dL (ref 70–99)
Potassium: 4.3 mmol/L (ref 3.5–5.1)
Sodium: 139 mmol/L (ref 135–145)
Total Bilirubin: 1 mg/dL (ref 0.3–1.2)
Total Protein: 6.9 g/dL (ref 6.5–8.1)

## 2020-10-03 MED ORDER — SODIUM CHLORIDE 0.9 % IV SOLN
Freq: Once | INTRAVENOUS | Status: AC
  Filled 2020-10-03: qty 250

## 2020-10-03 MED ORDER — ZOLEDRONIC ACID 4 MG/100ML IV SOLN
4.0000 mg | Freq: Once | INTRAVENOUS | Status: AC
  Administered 2020-10-03: 4 mg via INTRAVENOUS
  Filled 2020-10-03: qty 100

## 2020-10-03 MED ORDER — LEUPROLIDE ACETATE (3 MONTH) 22.5 MG ~~LOC~~ KIT
22.5000 mg | PACK | Freq: Once | SUBCUTANEOUS | Status: AC
Start: 2020-10-03 — End: 2020-10-03
  Administered 2020-10-03: 22.5 mg via SUBCUTANEOUS
  Filled 2020-10-03: qty 22.5

## 2020-10-03 NOTE — Progress Notes (Signed)
  [place] OFFICE PROGRESS NOTE   Diagnosis: Prostate cancer  INTERVAL HISTORY:   Edgar Mooney returns as scheduled.  He continues abiraterone and prednisone.  No new complaint.  No recent flare of gout.  He is no longer taking narcotic analgesics.  He feels a firm area superior to the umbilicus when standing.  He wonders whether he has a hernia.  Objective:  Vital signs in last 24 hours:  Blood pressure (!) 145/79, pulse 72, temperature 98.4 F (36.9 C), temperature source Oral, resp. rate 18, height 5\' 2"  (1.575 m), weight 165 lb (74.8 kg), SpO2 100 %.    HEENT: No thrush Resp: Lungs clear bilaterally Cardio: Regular rate and rhythm GI: No hepatomegaly, no mass, nontender, no apparent hernia Vascular: No leg edema    Portacath/PICC-without erythema  Lab Results:  Lab Results  Component Value Date   WBC 6.7 10/03/2020   HGB 14.2 10/03/2020   HCT 40.4 10/03/2020   MCV 90.4 10/03/2020   PLT 185 10/03/2020   NEUTROABS 4.0 10/03/2020    CMP  Lab Results  Component Value Date   NA 139 10/03/2020   K 4.3 10/03/2020   CL 106 10/03/2020   CO2 21 (L) 10/03/2020   GLUCOSE 91 10/03/2020   BUN 22 (H) 10/03/2020   CREATININE 0.73 10/03/2020   CALCIUM 9.9 10/03/2020   PROT 6.9 10/03/2020   ALBUMIN 4.3 10/03/2020   AST 40 10/03/2020   ALT 52 (H) 10/03/2020   ALKPHOS 83 10/03/2020   BILITOT 1.0 10/03/2020   GFRNONAA >60 10/03/2020   GFRAA 121 12/19/2017    No results found for: CEA1  Lab Results  Component Value Date   INR 1.4 (H) 03/03/2020    Imaging:  No results found.  Medications: I have reviewed the patient's current medications.   Assessment/Plan:  1. Metastatic prostate cancer - Lytic bone lesions with retroperitoneal adenopathy concerning for metastatic prostate cancer -03/03/2020-CTA chest/abdomen/pelvis widespread osseous metastatic disease and lower retroperitoneal adenopathy, pathologic right anterior third rib fracture,  probable spinal canal tumor at the level of S1, -MRI cervical, thoracic, and lumbar spine 03/03/2020-diffuse osseous metastatic disease, ventral epidural tumor impinging on right S1 nerve root, extraosseous tumor at the bilateral ilium, asymmetric enhancing material at the right C5-6 canal-right  -03/03/2020-PSA 763 -Degarelix 03/04/2020 -Abiraterone/prednisone 03/14/2020 -Every 53-month Lupron 04/01/2020 -04/01/2020 PSA 36.5 -05/31/2020 PSA 1.2 -06/28/2020 PSA 1.0 2.  Severe anemia-likely secondary to metastatic prostate cancer involving the bones 3.  Mild thrombocytopenia 4.  Cirrhosis with splenomegaly 5.  History of hepatitis C 6.  History of polysubstance abuse 7.  Depression 8.  Tobacco dependence 9.  Right arm weakness, right facial numbness-potentially related to nerve root compromise from metastatic bone lesions 10.  Pain secondary to #1 11.  Extensive bone metastases-every 13-month Zometa starting 04/01/2020 12.  Gout-acute flare right wrist 04/01/2020 treated with indomethacin, allopurinol resumed       Disposition: Edgar Mooney appears stable.  He is tolerating the abiraterone/prednisone well.  There is no clinical evidence for progression of the prostate cancer.  He will receive Zometa and Lupron today.  He will return for an office visit in 3 months.  Edgar Coder, MD  10/03/2020  12:48 PM

## 2020-10-03 NOTE — Patient Instructions (Signed)
Edgar Mooney  Discharge Instructions: Thank you for choosing [facility] to provide your oncology and hematology care.   If you have a lab appointment with the [facility], please go directly to the [facility] and check in at the registration area.   Wear comfortable clothing and clothing appropriate for easy access to any Portacath or PICC line.   We strive to give you quality time with your provider. You may need to reschedule your appointment if you arrive late (15 or more minutes).  Arriving late affects you and other patients whose appointments are after yours.  Also, if you miss three or more appointments without notifying the office, you may be dismissed from the clinic at the provider's discretion.      For prescription refill requests, have your pharmacy contact our office and allow 72 hours for refills to be completed.    Today you received the following chemotherapy and/or immunotherapy agents Zometa, Lupron      To help prevent nausea and vomiting after your treatment, we encourage you to take your nausea medication as directed.  BELOW ARE SYMPTOMS THAT SHOULD BE REPORTED IMMEDIATELY: *FEVER GREATER THAN 100.4 F (38 C) OR HIGHER *CHILLS OR SWEATING *NAUSEA AND VOMITING THAT IS NOT CONTROLLED WITH YOUR NAUSEA MEDICATION *UNUSUAL SHORTNESS OF BREATH *UNUSUAL BRUISING OR BLEEDING *URINARY PROBLEMS (pain or burning when urinating, or frequent urination) *BOWEL PROBLEMS (unusual diarrhea, constipation, pain near the anus) TENDERNESS IN MOUTH AND THROAT WITH OR WITHOUT PRESENCE OF ULCERS (sore throat, sores in mouth, or a toothache) UNUSUAL RASH, SWELLING OR PAIN  UNUSUAL VAGINAL DISCHARGE OR ITCHING   Items with * indicate a potential emergency and should be followed up as soon as possible or go to the Emergency Department if any problems should occur.  Please show the CHEMOTHERAPY ALERT CARD or IMMUNOTHERAPY ALERT CARD at check-in to  the Emergency Department and triage nurse.  Should you have questions after your visit or need to cancel or reschedule your appointment, please contact [facility]  Dept: [phone]  and follow the prompts.  Office hours are 8:00 a.m. to 4:30 p.m. Monday - Friday. Please note that voicemails left after 4:00 p.m. may not be returned until the following business day.  We are closed weekends and major holidays. You have access to a nurse at all times for urgent questions. Please call the main number to the clinic Dept: [phone] and follow the prompts.   For any non-urgent questions, you may also contact your provider using MyChart. We now offer e-Visits for anyone 21 and older to request care online for non-urgent symptoms. For details visit [email or web address].   Also download the MyChart app! Go to the app store, search "MyChart", open the app, select [facility], and log in with your MyChart username and password.  Due to Covid, a mask is required upon entering the hospital/clinic. If you do not have a mask, one will be given to you upon arrival. For doctor visits, patients may have 1 support person aged 43 or older with them. For treatment visits, patients cannot have anyone with them due to current Covid guidelines and our immunocompromised population.   Zoledronic Acid Injection (Hypercalcemia, Oncology) What is this medication? ZOLEDRONIC ACID (ZOE le dron ik AS id) slows calcium loss from bones. It high calcium levels in the blood from some kinds of cancer. It may be used in otherpeople at risk for bone loss. This medicine may  be used for other purposes; ask your health care provider orpharmacist if you have questions. COMMON BRAND NAME(S): Zometa What should I tell my care team before I take this medication? They need to know if you have any of these conditions: cancer dehydration dental disease kidney disease liver disease low levels of calcium in  the blood lung or breathing disease (asthma) receiving steroids like dexamethasone or prednisone an unusual or allergic reaction to zoledronic acid, other medicines, foods, dyes, or preservatives pregnant or trying to get pregnant breast-feeding How should I use this medication? This drug is injected into a vein. It is given by a health care provider in [facility] or clinic setting. Talk to your health care provider about the use of this drug in children.Special care may be needed. Overdosage: If you think you have taken too much of this medicine contact apoison control center or emergency room at once. NOTE: This medicine is only for you. Do not share this medicine with others. What if I miss a dose? Keep appointments for follow-up doses. It is important not to miss your dose.Call your health care provider if you are unable to keep an appointment. What may interact with this medication? certain antibiotics given by injection NSAIDs, medicines for pain and inflammation, like ibuprofen or naproxen some diuretics like bumetanide, furosemide teriparatide thalidomide This list may not describe all possible interactions. Give your health care provider a list of all the medicines, herbs, non-prescription drugs, or dietary supplements you use. Also tell them if you smoke, drink alcohol, or use illegaldrugs. Some items may interact with your medicine. What should I watch for while using this medication? Visit your health care provider for regular checks on your progress. It may besome time before you see the benefit from this drug. Some people who take this drug have severe bone, joint, or muscle pain. This drug may also increase your risk for jaw problems or a broken thigh bone. Tell your health care provider right away if you have severe pain in your jaw, bones, joints, or muscles. Tell you health care provider if you have any painthat does not go away or that gets worse. Tell your dentist and dental  surgeon that you are taking this drug. You should not have major dental surgery while on this drug. See your dentist to have a dental exam and fix any dental problems before starting this drug. Take good care of your teeth while on this drug. Make sure you see your dentist forregular follow-up appointments. You should make sure you get enough calcium and vitamin D while you are taking this drug. Discuss the foods you eat and the vitamins you take with your healthcare provider. Check with your health care provider if you have severe diarrhea, nausea, and vomiting, or if you sweat a lot. The loss of too much body fluid may make itdangerous for you to take this drug. You may need blood work done while you are taking this drug. Do not become pregnant while taking this drug. Women should inform their health care provider if they wish to become pregnant or think they might be pregnant. There is potential for serious harm to an unborn child. Talk to your healthcare provider for more information. What side effects may I notice from receiving this medication? Side effects that you should report to your doctor or health care provider assoon as possible: allergic reactions (skin rash, itching or hives; swelling of the face, lips, or tongue) bone pain infection (fever, chills, cough,  sore throat, pain or trouble passing urine) jaw pain, especially after dental work joint pain kidney injury (trouble passing urine or change in the amount of urine) low blood pressure (dizziness; feeling faint or lightheaded, falls; unusually weak or tired) low calcium levels (fast heartbeat; muscle cramps or pain; pain, tingling, or numbness in the hands or feet; seizures) low magnesium levels (fast, irregular heartbeat; muscle cramp or pain; muscle weakness; tremors; seizures) low red blood cell counts (trouble breathing; feeling faint; lightheaded, falls; unusually weak or tired) muscle pain redness, blistering, peeling, or  loosening of the skin, including inside the mouth severe diarrhea swelling of the ankles, feet, hands trouble breathing Side effects that usually do not require medical attention (report to yourdoctor or health care provider if they continue or are bothersome): anxious constipation coughing depressed mood eye irritation, itching, or pain fever general ill feeling or flu-like symptoms nausea pain, redness, or irritation at site where injected trouble sleeping This list may not describe all possible side effects. Call your doctor for medical advice about side effects. You may report side effects to FDA at1-800-FDA-1088. Where should I keep my medication? This drug is given in a hospital or clinic. It will not be stored at home. NOTE: This sheet is a summary. It may not cover all possible information. If you have questions about this medicine, talk to your doctor, pharmacist, orhealth care provider.  2022 Elsevier/Gold Standard (2019-01-15 09:13:00)  Leuprolide injection What is this medication? LEUPROLIDE (loo PROE lide) is a man-made hormone. It is used to treat the symptoms of prostate cancer. This medicine may also be used to treat childrenwith early onset of puberty. It may be used for other hormonal conditions. This medicine may be used for other purposes; ask your health care provider orpharmacist if you have questions. COMMON BRAND NAME(S): Lupron What should I tell my care team before I take this medication? They need to know if you have any of these conditions: diabetes heart disease or previous heart attack high blood pressure high cholesterol pain or difficulty passing urine spinal cord metastasis stroke tobacco smoker an unusual or allergic reaction to leuprolide, benzyl alcohol, other medicines, foods, dyes, or preservatives pregnant or trying to get pregnant breast-feeding How should I use this medication? This medicine is for injection under the skin or into a  muscle. You will be taught how to prepare and give this medicine. Use exactly as directed. Take your medicine at regular intervals. Do not take your medicine more often thandirected. It is important that you put your used needles and syringes in a special sharps container. Do not put them in a trash can. If you do not have a sharpscontainer, call your pharmacist or healthcare provider to get one. A special MedGuide will be given to you by the pharmacist with eachprescription and refill. Be sure to read this information carefully each time. Talk to your pediatrician regarding the use of this medicine in children. While this medicine may be prescribed for children as young as 8 years for selectedconditions, precautions do apply. Overdosage: If you think you have taken too much of this medicine contact apoison control center or emergency room at once. NOTE: This medicine is only for you. Do not share this medicine with others. What if I miss a dose? If you miss a dose, take it as soon as you can. If it is almost time for yournext dose, take only that dose. Do not take double or extra doses. What may interact with this  medication? Do not take this medicine with any of the following medications: chasteberry cisapride dronedarone pimozide thioridazine This medicine may also interact with the following medications: herbal or dietary supplements, like black cohosh or DHEA male hormones, like estrogens or progestins and birth control pills, patches, rings, or injections male hormones, like testosterone other medicines that prolong the QT interval (abnormal heart rhythm) This list may not describe all possible interactions. Give your health care provider a list of all the medicines, herbs, non-prescription drugs, or dietary supplements you use. Also tell them if you smoke, drink alcohol, or use illegaldrugs. Some items may interact with your medicine. What should I watch for while using this  medication? Visit your doctor or health care professional for regular checks on your progress. During the first week, your symptoms may get worse, but then will improve as you continue your treatment. You may get hot flashes, increased bone pain, increased difficulty passing urine, or an aggravation of nerve symptoms. Discuss these effects with your doctor or health care professional, some ofthem may improve with continued use of this medicine. Male patients may experience a menstrual cycle or spotting during the first 2 months of therapy with this medicine. If this continues, contact your doctor orhealth care professional. This medicine may increase blood sugar. Ask your healthcare provider if changesin diet or medicines are needed if you have diabetes. What side effects may I notice from receiving this medication? Side effects that you should report to your doctor or health care professionalas soon as possible: allergic reactions like skin rash, itching or hives, swelling of the face, lips, or tongue breathing problems chest pain depression or memory disorders pain in your legs or groin pain at site where injected severe headache signs and symptoms of high blood sugar such as being more thirsty or hungry or having to urinate more than normal. You may also feel very tired or have blurry vision swelling of the feet and legs visual changes vomiting Side effects that usually do not require medical attention (report to yourdoctor or health care professional if they continue or are bothersome): breast swelling or tenderness decrease in sex drive or performance diarrhea hot flashes loss of appetite muscle, joint, or bone pains nausea redness or irritation at site where injected skin problems or acne This list may not describe all possible side effects. Call your doctor for medical advice about side effects. You may report side effects to FDA at1-800-FDA-1088. Where should I keep my  medication? Keep out of the reach of children. Store below 25 degrees C (77 degrees F). Do not freeze. Protect from light. Do not use if it is not clear or if there are particles present. Throw away anyunused medicine after the expiration date. NOTE: This sheet is a summary. It may not cover all possible information. If you have questions about this medicine, talk to your doctor, pharmacist, orhealth care provider.  2022 Elsevier/Gold Standard (2019-03-04 10:57:41)

## 2020-10-04 LAB — PROSTATE-SPECIFIC AG, SERUM (LABCORP): Prostate Specific Ag, Serum: 1.8 ng/mL (ref 0.0–4.0)

## 2020-10-10 ENCOUNTER — Other Ambulatory Visit (HOSPITAL_COMMUNITY): Payer: Self-pay

## 2020-10-24 ENCOUNTER — Other Ambulatory Visit (HOSPITAL_COMMUNITY): Payer: Self-pay

## 2020-10-24 ENCOUNTER — Other Ambulatory Visit: Payer: Self-pay | Admitting: Oncology

## 2020-10-25 ENCOUNTER — Other Ambulatory Visit: Payer: Self-pay | Admitting: Oncology

## 2020-10-25 ENCOUNTER — Encounter: Payer: Self-pay | Admitting: Oncology

## 2020-10-25 ENCOUNTER — Other Ambulatory Visit (HOSPITAL_COMMUNITY): Payer: Self-pay

## 2020-10-25 MED ORDER — PREDNISONE 5 MG PO TABS
ORAL_TABLET | Freq: Every day | ORAL | 5 refills | Status: DC
  Filled 2020-10-25: qty 30, 30d supply, fill #0
  Filled 2020-11-24: qty 30, 30d supply, fill #1
  Filled 2020-12-27: qty 30, 30d supply, fill #2
  Filled 2021-01-25: qty 30, 30d supply, fill #3

## 2020-10-25 MED ORDER — ABIRATERONE ACETATE 250 MG PO TABS
ORAL_TABLET | Freq: Every day | ORAL | 0 refills | Status: DC
  Filled 2020-10-25: qty 120, 30d supply, fill #0

## 2020-10-29 ENCOUNTER — Emergency Department (HOSPITAL_COMMUNITY)
Admission: EM | Admit: 2020-10-29 | Discharge: 2020-10-30 | Disposition: A | Discharge status: Home / Self Care | Payer: BC Managed Care – PPO | Attending: Emergency Medicine | Admitting: Emergency Medicine

## 2020-10-29 DIAGNOSIS — R079 Chest pain, unspecified: Secondary | ICD-10-CM | POA: Diagnosis not present

## 2020-10-29 DIAGNOSIS — Z5321 Procedure and treatment not carried out due to patient leaving prior to being seen by health care provider: Secondary | ICD-10-CM | POA: Diagnosis not present

## 2020-10-29 DIAGNOSIS — R0789 Other chest pain: Secondary | ICD-10-CM | POA: Diagnosis not present

## 2020-10-29 DIAGNOSIS — W19XXXA Unspecified fall, initial encounter: Secondary | ICD-10-CM | POA: Diagnosis not present

## 2020-10-29 NOTE — ED Notes (Signed)
Pt states he has called a ride to come pick him up and he is leaving

## 2020-11-14 ENCOUNTER — Other Ambulatory Visit (HOSPITAL_COMMUNITY): Payer: Self-pay

## 2020-11-14 ENCOUNTER — Telehealth: Payer: Self-pay | Admitting: *Deleted

## 2020-11-14 NOTE — Telephone Encounter (Signed)
Dr. Benay Spice agreed w/nursing advice to patient to go to ER or Urgent Care.

## 2020-11-14 NOTE — Telephone Encounter (Signed)
Patient called requesting an appointment with Dr. Benay Spice to evaluate his left breast chest pain that he has been having on a daily basis. Reports it will begin while he is out walking and lasts about 15 minutes--eases up w/rest. Some shortness of breath during episodes. Occurs almost daily. Asking for Dr. Benay Spice to see him to evaluate. He does not have a PCP. Informed patient that this sounds cardiac in nature and he should go to Urgent Care now or to ER during an episode. Most likely needs to be established w/cardiology.

## 2020-11-17 ENCOUNTER — Other Ambulatory Visit (HOSPITAL_COMMUNITY): Payer: Self-pay

## 2020-11-23 ENCOUNTER — Other Ambulatory Visit: Payer: Self-pay | Admitting: Oncology

## 2020-11-23 ENCOUNTER — Other Ambulatory Visit (HOSPITAL_COMMUNITY): Payer: Self-pay

## 2020-11-23 MED ORDER — ABIRATERONE ACETATE 250 MG PO TABS
ORAL_TABLET | Freq: Every day | ORAL | 0 refills | Status: DC
  Filled 2020-11-23: qty 120, fill #0
  Filled 2020-11-24: qty 120, 30d supply, fill #0

## 2020-11-24 ENCOUNTER — Other Ambulatory Visit (HOSPITAL_COMMUNITY): Payer: Self-pay

## 2020-12-10 ENCOUNTER — Emergency Department (HOSPITAL_COMMUNITY)
Admission: EM | Admit: 2020-12-10 | Discharge: 2020-12-10 | Disposition: A | Discharge status: Home / Self Care | Payer: BC Managed Care – PPO | Attending: Emergency Medicine | Admitting: Emergency Medicine

## 2020-12-10 ENCOUNTER — Emergency Department (HOSPITAL_COMMUNITY): Payer: BC Managed Care – PPO

## 2020-12-10 ENCOUNTER — Encounter (HOSPITAL_COMMUNITY): Payer: Self-pay

## 2020-12-10 ENCOUNTER — Other Ambulatory Visit: Payer: Self-pay

## 2020-12-10 DIAGNOSIS — Z8546 Personal history of malignant neoplasm of prostate: Secondary | ICD-10-CM | POA: Insufficient documentation

## 2020-12-10 DIAGNOSIS — R1011 Right upper quadrant pain: Secondary | ICD-10-CM | POA: Diagnosis not present

## 2020-12-10 DIAGNOSIS — F17210 Nicotine dependence, cigarettes, uncomplicated: Secondary | ICD-10-CM | POA: Diagnosis not present

## 2020-12-10 DIAGNOSIS — R160 Hepatomegaly, not elsewhere classified: Secondary | ICD-10-CM | POA: Diagnosis not present

## 2020-12-10 DIAGNOSIS — I10 Essential (primary) hypertension: Secondary | ICD-10-CM | POA: Diagnosis not present

## 2020-12-10 DIAGNOSIS — K7689 Other specified diseases of liver: Secondary | ICD-10-CM | POA: Diagnosis not present

## 2020-12-10 DIAGNOSIS — R1031 Right lower quadrant pain: Secondary | ICD-10-CM | POA: Diagnosis not present

## 2020-12-10 LAB — COMPREHENSIVE METABOLIC PANEL
ALT: 52 U/L — ABNORMAL HIGH (ref 0–44)
AST: 49 U/L — ABNORMAL HIGH (ref 15–41)
Albumin: 3.9 g/dL (ref 3.5–5.0)
Alkaline Phosphatase: 75 U/L (ref 38–126)
Anion gap: 8 (ref 5–15)
BUN: 10 mg/dL (ref 6–20)
CO2: 23 mmol/L (ref 22–32)
Calcium: 9.4 mg/dL (ref 8.9–10.3)
Chloride: 108 mmol/L (ref 98–111)
Creatinine, Ser: 0.74 mg/dL (ref 0.61–1.24)
GFR, Estimated: >60 mL/min (ref >60)
Glucose, Bld: 112 mg/dL — ABNORMAL HIGH (ref 70–99)
Potassium: 4.2 mmol/L (ref 3.5–5.1)
Sodium: 139 mmol/L (ref 135–145)
Total Bilirubin: 1 mg/dL (ref 0.3–1.2)
Total Protein: 6.8 g/dL (ref 6.5–8.1)

## 2020-12-10 LAB — CBC
HCT: 38.8 % — ABNORMAL LOW (ref 39.0–52.0)
Hemoglobin: 13.4 g/dL (ref 13.0–17.0)
MCH: 31.5 pg (ref 26.0–34.0)
MCHC: 34.5 g/dL (ref 30.0–36.0)
MCV: 91.3 fL (ref 80.0–100.0)
Platelets: 167 10*3/uL (ref 150–400)
RBC: 4.25 MIL/uL (ref 4.22–5.81)
RDW: 12.5 % (ref 11.5–15.5)
WBC: 8.7 10*3/uL (ref 4.0–10.5)
nRBC: 0 % (ref 0.0–0.2)

## 2020-12-10 LAB — URINALYSIS, ROUTINE W REFLEX MICROSCOPIC
Bilirubin Urine: NEGATIVE
Glucose, UA: NEGATIVE mg/dL
Hgb urine dipstick: NEGATIVE
Ketones, ur: NEGATIVE mg/dL
Leukocytes,Ua: NEGATIVE
Nitrite: NEGATIVE
Protein, ur: NEGATIVE mg/dL
Specific Gravity, Urine: 1.019 (ref 1.005–1.030)
pH: 5 (ref 5.0–8.0)

## 2020-12-10 LAB — LIPASE, BLOOD: Lipase: 32 U/L (ref 11–51)

## 2020-12-10 IMAGING — CT CT ABD-PELV W/ CM
2 of 5 series · 13 of 46 positions shown, 15 images · IV contrast (APPLIED)
Comparison: CT abdomen pelvis dated [DATE].
COMPARISON: CT abdomen pelvis dated [DATE].

Addendum:
CLINICAL DATA: Right lower quadrant pain. The patient has a history
of hepatitis C cirrhosis and prostate cancer.

EXAM:
CT ABDOMEN AND PELVIS WITH CONTRAST
TECHNIQUE: Multidetector CT imaging of the abdomen and pelvis was performed
using the standard protocol following bolus administration of
intravenous contrast.
CONTRAST:  80mL OMNIPAQUE IOHEXOL 350 MG/ML SOLN

[Series 3: abdomen 5.0 · axial · 0.88mm/px · z∈[+607,+1012]mm · 10 of 95 slices shown, 12 images]
[im 7/95  soft-tissue]
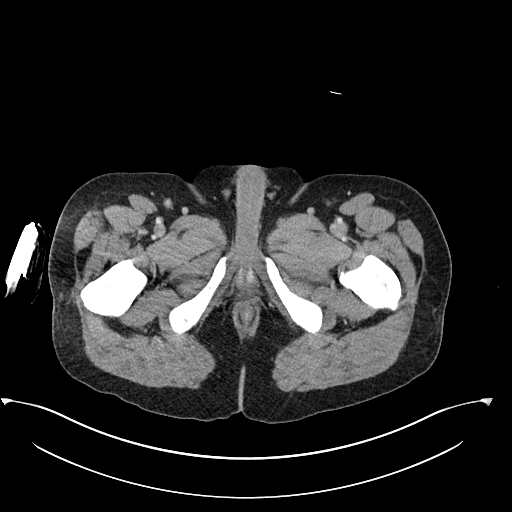
[im 7/95  bone]
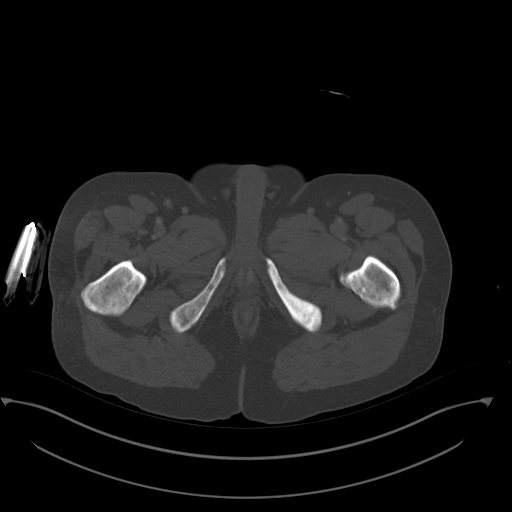
[im 19/95  soft-tissue]
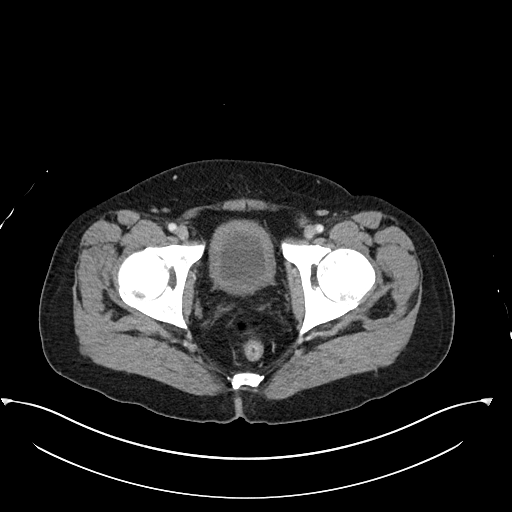
[im 26/95  soft-tissue]
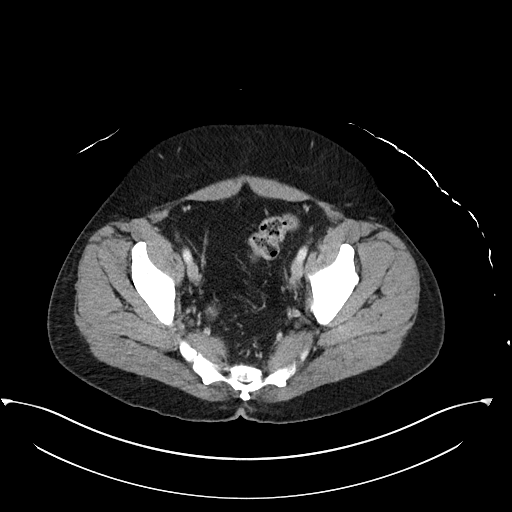
[im 32/95  soft-tissue]
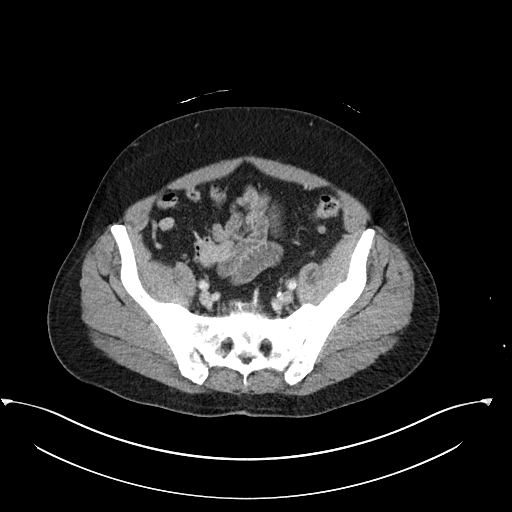
[im 44/95  soft-tissue]
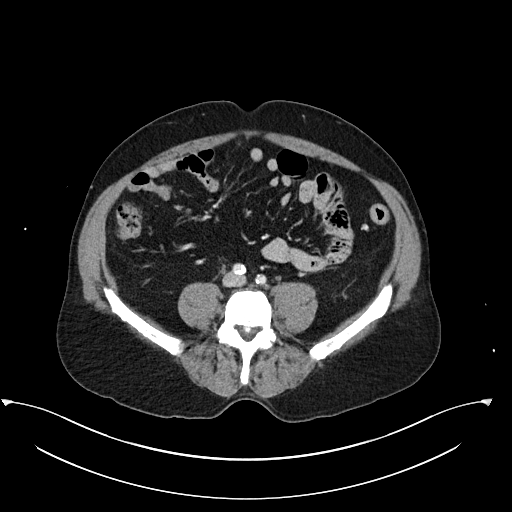
[im 51/95  soft-tissue]
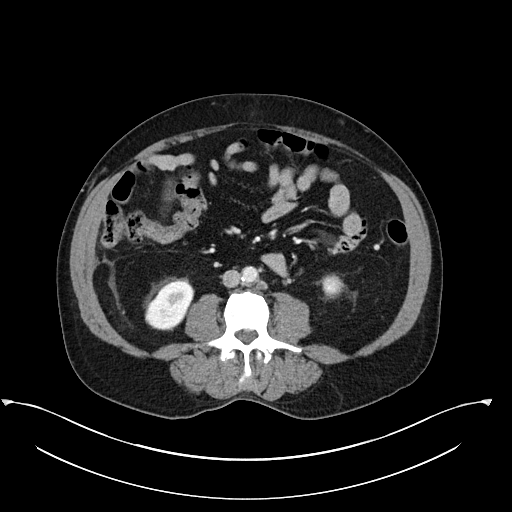
[im 63/95  soft-tissue]
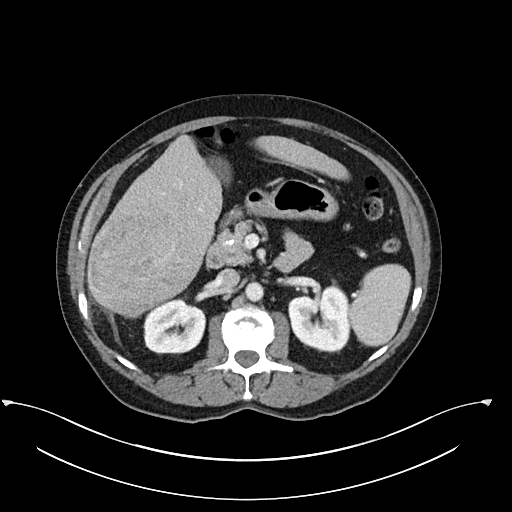
[im 69/95  soft-tissue]
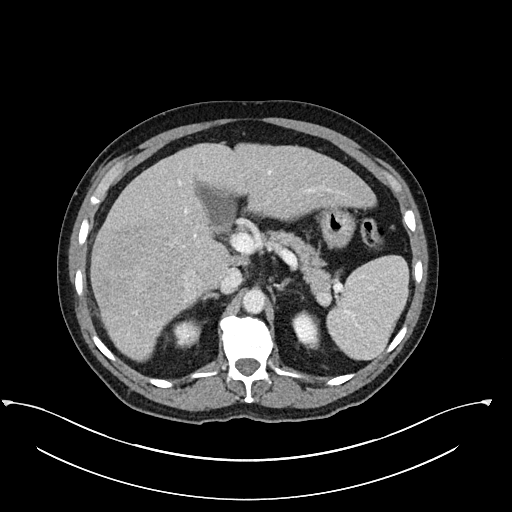
[im 76/95  soft-tissue]
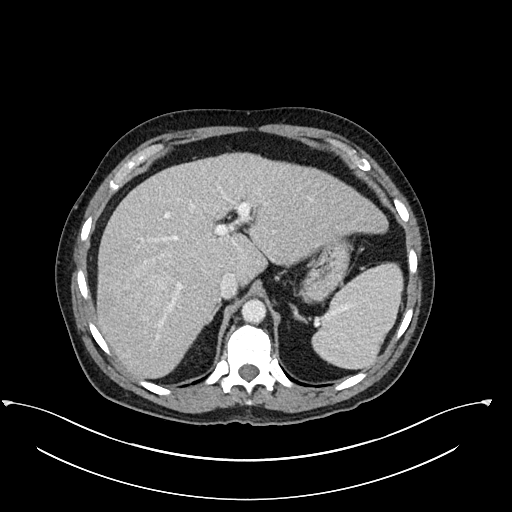
[im 76/95  bone]
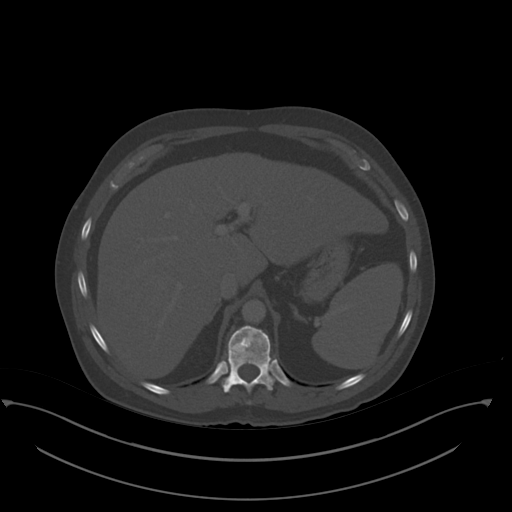
[im 88/95  soft-tissue]
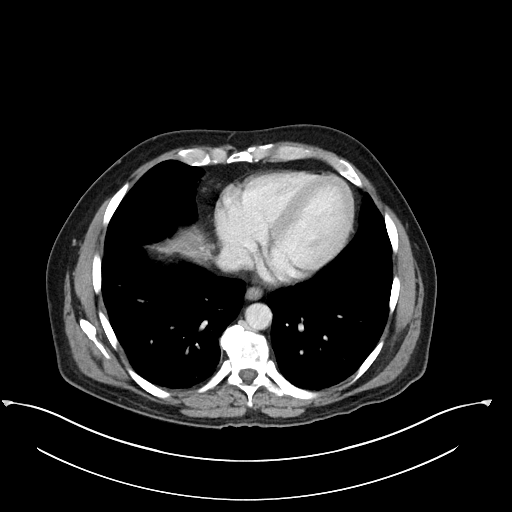

[Series 6: abdomen 3.0 mpr cor · coronal · 0.74mm/px · 3 of 95 slices shown]
[im 32/95  soft-tissue]
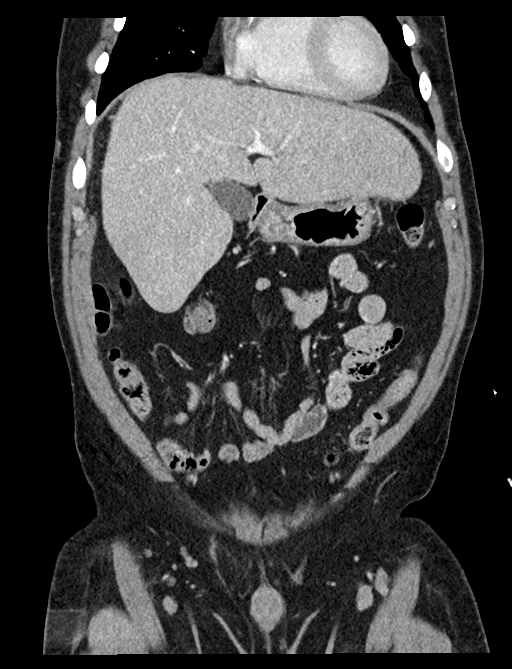
[im 42/95  soft-tissue]
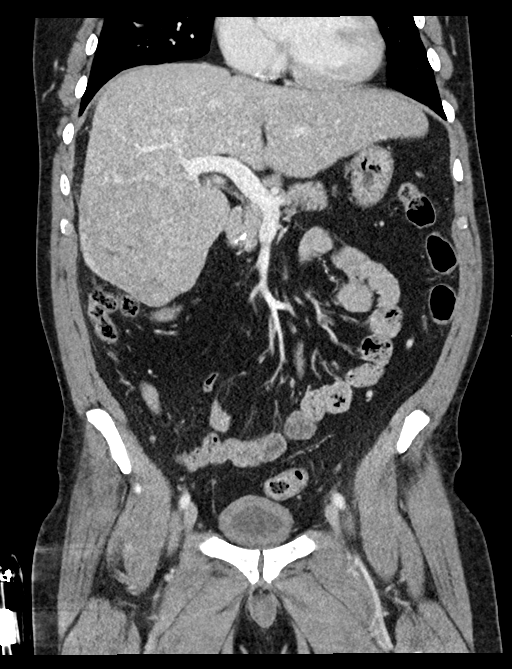
[im 53/95  soft-tissue]
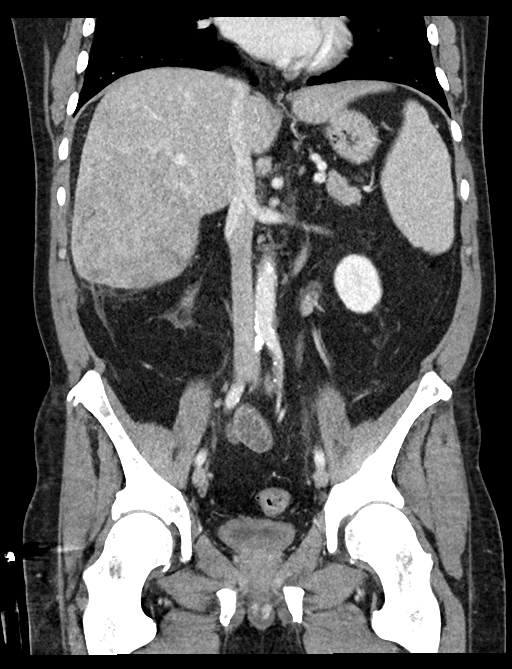

[13 of 46 positions shown; findings below may reference images not displayed]

FINDINGS: Lower chest: Mild bilateral dependent atelectasis.

Hepatobiliary: An enhancing mass in the right hepatic lobe measures
9.1 x 9.2 x 9.2 cm and appears to demonstrate washout enhancement on
delayed phase imaging. This was not seen on prior exam. No
gallstones, gallbladder wall thickening, or biliary dilatation.

Pancreas: Calcifications in the head of the pancreas are unchanged
and likely reflect chronic pancreatitis. No pancreatic ductal
dilatation or surrounding inflammatory changes.

Spleen: Normal in size without focal abnormality.

Adrenals/Urinary Tract: Adrenal glands are unremarkable. Kidneys are
normal, without renal calculi, focal lesion, or hydronephrosis. The
urinary bladder wall appears thickened for the degree of distension.

Stomach/Bowel: Stomach is within normal limits. Appendix appears
normal. No evidence of bowel wall thickening, distention, or
inflammatory changes.

Vascular/Lymphatic: Aortic atherosclerosis. A lymph node to the left
of the inferior vena cava near the porta hepatis measures 1.2 cm in
short axis (series 3, image 24 and may be slightly enlarged compared
to [DATE]. No enlarged pelvic lymph nodes.

Reproductive: Prostate is unremarkable.

Other: There is a small amount of peritoneal fluid near the hepatic
mass extending along the right pericolic gutter. No abdominal wall
hernia or abnormality.

Musculoskeletal: Numerous sclerotic lesions throughout the spine and
pelvis appear progressed since prior exam. Degenerative changes are
seen in the spine. Previously seen metastatic disease involving the
ventral epidural space at the level of S1 is not appreciated on
today's exam.
IMPRESSION: 1. Enhancing mass in the right hepatic lobe is concerning for
hepatocellular carcinoma given the apparent washout on delayed phase
and reported history of hepatitis C cirrhosis. Metastatic disease
from the patient's reported prostate cancer is considered less
likely.
2. Urinary bladder wall thickening for the degree of distension may
reflect cystitis or chronic bladder outlet obstruction.
3. Numerous sclerotic lesions throughout the spine and pelvis appear
progressed since [DATE], likely representing metastatic disease.

ADDENDUM:
Of note, the hepatic mass could be further evaluated with a
multiphase MR abdomen without and with contrast.

*** End of Addendum ***
FINDINGS: Lower chest: Mild bilateral dependent atelectasis.

Hepatobiliary: An enhancing mass in the right hepatic lobe measures
9.1 x 9.2 x 9.2 cm and appears to demonstrate washout enhancement on
delayed phase imaging. This was not seen on prior exam. No
gallstones, gallbladder wall thickening, or biliary dilatation.

Pancreas: Calcifications in the head of the pancreas are unchanged
and likely reflect chronic pancreatitis. No pancreatic ductal
dilatation or surrounding inflammatory changes.

Spleen: Normal in size without focal abnormality.

Adrenals/Urinary Tract: Adrenal glands are unremarkable. Kidneys are
normal, without renal calculi, focal lesion, or hydronephrosis. The
urinary bladder wall appears thickened for the degree of distension.

Stomach/Bowel: Stomach is within normal limits. Appendix appears
normal. No evidence of bowel wall thickening, distention, or
inflammatory changes.

Vascular/Lymphatic: Aortic atherosclerosis. A lymph node to the left
of the inferior vena cava near the porta hepatis measures 1.2 cm in
short axis (series 3, image 24 and may be slightly enlarged compared
to [DATE]. No enlarged pelvic lymph nodes.

Reproductive: Prostate is unremarkable.

Other: There is a small amount of peritoneal fluid near the hepatic
mass extending along the right pericolic gutter. No abdominal wall
hernia or abnormality.

Musculoskeletal: Numerous sclerotic lesions throughout the spine and
pelvis appear progressed since prior exam. Degenerative changes are
seen in the spine. Previously seen metastatic disease involving the
ventral epidural space at the level of S1 is not appreciated on
today's exam.
IMPRESSION: 1. Enhancing mass in the right hepatic lobe is concerning for
hepatocellular carcinoma given the apparent washout on delayed phase
and reported history of hepatitis C cirrhosis. Metastatic disease
from the patient's reported prostate cancer is considered less
likely.
2. Urinary bladder wall thickening for the degree of distension may
reflect cystitis or chronic bladder outlet obstruction.
3. Numerous sclerotic lesions throughout the spine and pelvis appear
progressed since [DATE], likely representing metastatic disease.

## 2020-12-10 MED ORDER — OXYCODONE-ACETAMINOPHEN 5-325 MG PO TABS
2.0000 | ORAL_TABLET | Freq: Once | ORAL | Status: AC
  Administered 2020-12-10: 2 via ORAL
  Filled 2020-12-10: qty 2

## 2020-12-10 MED ORDER — IOHEXOL 350 MG/ML SOLN
80.0000 mL | Freq: Once | INTRAVENOUS | Status: AC | PRN
  Administered 2020-12-10: 80 mL via INTRAVENOUS

## 2020-12-10 MED ORDER — HYDROCODONE-ACETAMINOPHEN 5-325 MG PO TABS: 1.0000 | ORAL_TABLET | Freq: Four times a day (QID) | ORAL | 0 refills | Status: DC | PRN

## 2020-12-10 NOTE — ED Provider Notes (Signed)
[facility] Provider Triage Evaluation Note  Edgar Mooney , a 55 y.o. male  was evaluated in triage.  Pt complains of right-sided mid abdominal pain he states has been ongoing since he states has been constant achy crampy and somewhat worsening.  Denies fevers and states he has had no urinary frequency urgency or hematuria.  Denies any lightheadedness or dizziness.  No chest pain or shortness of breath. He is currently a male patient   Review of Systems  Positive: Abdominal pain Negative: Fever  Physical Exam  BP 123/79 (BP Location: Right Arm)   Pulse 73   Temp 98.4 F (36.9 C) (Oral)   Resp 18   SpO2 99%  Gen:   Awake, no distress   Resp:  Normal effort  MSK:   Moves extremities without difficulty  Other:  Tenderness on palpation of the right mid abdomen.  Nontender at McBurney's point.  Medical Decision Making  Medically screening exam initiated at 10:05 AM.  Appropriate orders placed.  Edgar Mooney was informed that the remainder of the evaluation will be completed by another provider, this initial triage assessment does not replace that evaluation, and the importance of remaining in the ED until their evaluation is complete.  Patient does have abdominal tenderness.  Will obtain CT imaging.  Lab work also obtained.   Edgar Mooney [place], [place] 12/10/20 1006    Lacretia Leigh, MD 12/13/20 [phone]

## 2020-12-10 NOTE — ED Triage Notes (Signed)
Patient complains of right sided abdominal pain x 2 days with nausea. Denis fever and chills and states that he is taking po chemo for his cancer.

## 2020-12-10 NOTE — Discharge Instructions (Addendum)
You were seen in the emergency department for right upper abdominal pain.  Your lab work did not show any significant abnormalities but your CAT scan showed a mass in the liver.  This will need further testing.  We have contacted your oncologist and they will help set up these tests.  We are prescribing you some narcotic pain medicine to help with pain.  Return to the emergency department if any fever or uncontrolled pain.

## 2020-12-10 NOTE — ED Provider Notes (Signed)
[facility] EMERGENCY DEPARTMENT Provider Note   CSN: [id number] Arrival date & time: 12/10/20  [id number]     History No chief complaint on file.   Edgar Mooney is a 55 y.o. male.  He has a history of prostate cancer and hepatitis C.  He is complaining of right upper quadrant abdominal pain that is been going on for 2 days.  Worse with taking a breath or laying on that side.  No trauma.  No fevers chills nausea vomiting diarrhea or urinary symptoms.  He said he had 3 drinks last night but does not drink every day.  The history is provided by the patient.  Abdominal Pain Pain location:  RUQ Pain quality: aching   Pain radiates to:  Does not radiate Pain severity now: 7/10. Onset quality:  Gradual Duration:  2 days Timing:  Constant Progression:  Unchanged Chronicity:  New Context: alcohol use   Context: not trauma   Relieved by:  None tried Worsened by:  Deep breathing, palpation and position changes Ineffective treatments:  None tried Associated symptoms: no chest pain, no constipation, no cough, no diarrhea, no dysuria, no fever, no hematemesis, no hematochezia, no hematuria, no nausea, no shortness of breath, no sore throat and no vomiting       Past Medical History:  Diagnosis Date   Alcohol abuse    Cocaine abuse ([place])    Depression    Gout    Hep C w/o coma, chronic ([place]) diagnosed May 2016    Patient Active Problem List   Diagnosis Date Noted   Prostate cancer ([place]) 03/07/2020   Anemia 03/03/2020   Hypertriglyceridemia 02/27/2018   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 No pertinent surgical history.     Family History  Problem Relation Age of Onset   Cancer Mother    Cancer Father     Social History   Tobacco Use   Smoking status: Every Day    Packs/day: 1.00    Years: 25.00    Pack years: 25.00    Types: Cigarettes   Smokeless tobacco: Never  Substance Use Topics   Alcohol use: Not Currently    Alcohol/week: 126.0 standard drinks    Types: 126 Cans of beer per week    Comment: 18 pack per day   Drug use: Yes    Types: Cocaine, IV, Heroin, Marijuana    Comment: Used crack and cocaine 04/02/16    Home Medications Prior to Admission medications   Medication Sig Start Date End Date Taking? Authorizing Provider  abiraterone acetate (ZYTIGA) 250 MG tablet TAKE 4 TABLETS (1,000 MG TOTAL) BY MOUTH DAILY. TAKE ON AN EMPTY STOMACH 1 HOUR BEFORE OR 2 HOURS AFTER A MEAL 11/23/20 11/23/21  Ladell Pier, MD  allopurinol (ZYLOPRIM) 100 MG tablet TAKE 1 TABLET BY MOUTH EVERY DAY START ON 12/24 09/05/20   Ladell Pier, MD  ferrous gluconate (FERGON) 324 MG tablet Take 1 tablet (324 mg total) by mouth 2 (two) times daily with a meal. Patient not taking: Reported on  10/03/2020 03/07/20   [place] Shi, MD  HYDROcodone-acetaminophen (NORCO) 5-325 MG tablet Take 1 tablet by mouth daily as needed for moderate pain. Patient not taking: Reported on 10/03/2020 07/01/20   Owens Shark, NP  indomethacin (INDOCIN) 50 MG capsule Take 1 capsule (50 mg total) by mouth 3 (three) times daily with meals. For gout flare Patient not taking: No sig reported 04/01/20   Owens Shark, NP  ondansetron (ZOFRAN) 4 MG tablet Take 1 tablet (4 mg total) by mouth every 6 (six) hours as needed for nausea. Patient not taking: No sig reported 04/01/20   Ladell Pier, MD  predniSONE (DELTASONE) 5 MG tablet TAKE 1 TABLET (5 MG TOTAL) BY MOUTH DAILY WITH BREAKFAST. 10/25/20 10/25/21  Ladell Pier, MD  tamsulosin (FLOMAX) 0.4 MG CAPS capsule TAKE 1 CAPSULE BY MOUTH EVERY DAY AFTER SUPPER 09/05/20   Ladell Pier, MD    Allergies    Patient has no known allergies.  Review of Systems   Review of Systems  Constitutional:  Negative for fever.  HENT:  Negative for sore throat.   Eyes:  Negative for visual disturbance.  Respiratory:  Negative for cough and shortness of breath.   Cardiovascular:  Negative for chest pain.  Gastrointestinal:  Positive for abdominal pain. Negative for constipation, diarrhea, hematemesis, hematochezia, nausea and vomiting.  Genitourinary:  Negative for dysuria and hematuria.  Musculoskeletal:  Negative for neck pain.  Skin:  Negative for rash.  Neurological:  Negative for headaches.   Physical Exam Updated Vital Signs BP (!) 151/84   Pulse (!) 59   Temp 98.4 F (36.9 C) (Oral)   Resp 16   SpO2 98%   Physical Exam Vitals and nursing note reviewed.  Constitutional:      Appearance: Normal appearance. He is well-developed.  HENT:     Head: Normocephalic and atraumatic.  Eyes:     Conjunctiva/sclera: Conjunctivae normal.  Cardiovascular:     Rate and Rhythm: Normal rate and regular rhythm.     Heart sounds: No murmur heard. Pulmonary:     Effort: Pulmonary effort is normal. No respiratory distress.     Breath sounds: Normal breath sounds.  Abdominal:     Palpations: Abdomen is soft.     Tenderness: There is abdominal tenderness. There is no guarding or rebound.  Musculoskeletal:        General: No deformity or signs of injury. Normal range of motion.     Cervical back: Neck supple.  Skin:    General: Skin is warm and dry.  Neurological:     General: No focal deficit present.     Mental Status: He is alert.     Gait: Gait normal.    ED Results / Procedures / Treatments   Labs (all labs ordered are listed, but only abnormal results are displayed) Labs Reviewed  COMPREHENSIVE METABOLIC PANEL - Abnormal; Notable for the following  components:      Result Value   Glucose, Bld 112 (*)    AST 49 (*)    ALT 52 (*)    All other components within normal limits  CBC - Abnormal; Notable for the following components:   HCT 38.8 (*)    All other components within normal limits  LIPASE, BLOOD  URINALYSIS, ROUTINE W REFLEX MICROSCOPIC    EKG None  Radiology CT ABDOMEN PELVIS W CONTRAST  Addendum Date: 12/10/2020   ADDENDUM REPORT: 12/10/2020 14:58 ADDENDUM: Of note, the hepatic mass could be  further evaluated with a multiphase MR abdomen without and with contrast. Electronically Signed   By: Zerita Boers M.D.   On: 12/10/2020 14:58   Result Date: 12/10/2020 CLINICAL DATA:  Right lower quadrant pain. The patient has a history of hepatitis C cirrhosis and prostate cancer. EXAM: CT ABDOMEN AND PELVIS WITH CONTRAST TECHNIQUE: Multidetector CT imaging of the abdomen and pelvis was performed using the standard protocol following bolus administration of intravenous contrast. CONTRAST:  79m OMNIPAQUE IOHEXOL 350 MG/ML SOLN COMPARISON:  CT abdomen pelvis dated 03/03/2020. FINDINGS: Lower chest: Mild bilateral dependent atelectasis. Hepatobiliary: An enhancing mass in the right hepatic lobe measures 9.1 x 9.2 x 9.2 cm and appears to demonstrate washout enhancement on delayed phase imaging. This was not seen on prior exam. No gallstones, gallbladder wall thickening, or biliary dilatation. Pancreas: Calcifications in the head of the pancreas are unchanged and likely reflect chronic pancreatitis. No pancreatic ductal dilatation or surrounding inflammatory changes. Spleen: Normal in size without focal abnormality. Adrenals/Urinary Tract: Adrenal glands are unremarkable. Kidneys are normal, without renal calculi, focal lesion, or hydronephrosis. The urinary bladder wall appears thickened for the degree of distension. Stomach/Bowel: Stomach is within normal limits. Appendix appears normal. No evidence of bowel wall thickening, distention, or  inflammatory changes. Vascular/Lymphatic: Aortic atherosclerosis. A lymph node to the left of the inferior vena cava near the porta hepatis measures 1.2 cm in short axis (series 3, image 24 and may be slightly enlarged compared to 03/03/2020. No enlarged pelvic lymph nodes. Reproductive: Prostate is unremarkable. Other: There is a small amount of peritoneal fluid near the hepatic mass extending along the right pericolic gutter. No abdominal wall hernia or abnormality. Musculoskeletal: Numerous sclerotic lesions throughout the spine and pelvis appear progressed since prior exam. Degenerative changes are seen in the spine. Previously seen metastatic disease involving the ventral epidural space at the level of S1 is not appreciated on today's exam. IMPRESSION: 1. Enhancing mass in the right hepatic lobe is concerning for hepatocellular carcinoma given the apparent washout on delayed phase and reported history of hepatitis C cirrhosis. Metastatic disease from the patient's reported prostate cancer is considered less likely. 2. Urinary bladder wall thickening for the degree of distension may reflect cystitis or chronic bladder outlet obstruction. 3. Numerous sclerotic lesions throughout the spine and pelvis appear progressed since 03/03/2020, likely representing metastatic disease. Electronically Signed: By: TZerita BoersM.D. On: 12/10/2020 14:53    Procedures Procedures   Medications Ordered in ED Medications  oxyCODONE-acetaminophen (PERCOCET/ROXICET) 5-325 MG per tablet 2 tablet (has no administration in time range)  iohexol (OMNIPAQUE) 350 MG/ML injection 80 mL (80 mLs Intravenous Contrast Given 12/10/20 1426)    ED Course  I have reviewed the triage vital signs and the nursing notes.  Pertinent labs & imaging results that were available during my care of the patient were reviewed by me and considered in my medical decision making (see chart for details).  Clinical Course as of 12/11/20 1031  Sat Dec 10, 2020  1626 Discussed with Dr. GLindi Adiefrom oncology.  He felt that there were no other indications for admission that I should message him and Dr. SAmmie Daltonso they can reach out to the patient on Monday and get him set up with an MRI. [MB]    Clinical Course User Index [MB] BHayden Rasmussen MD   MDM Rules/Calculators/A&P  This patient complains of right upper quadrant abdominal pain; this involves an extensive number of treatment Options and is a complaint that carries with it a high risk of complications and Morbidity. The differential includes cholelithiasis, cholecystitis, liver mass, hepatitis, peptic ulcer disease, musculoskeletal pain, urinary tract infection, pyelonephritis, renal colic  I ordered, reviewed and interpreted labs, which included CBC with normal white count, stable hemoglobin, chemistries normal, LFTs with mild elevation of AST and ALT similar to priors, urinalysis without signs of infection I ordered medication oral pain medication I ordered imaging studies which included CT abdomen pelvis and I independently    visualized and interpreted imaging which showed enhancing liver mass probable hepatocellular carcinoma versus metastasis Previous records obtained and reviewed in epic including prior oncology notes I consulted Dr. Payton Mccallum oncology and discussed lab and imaging findings  Critical Interventions: None  After the interventions stated above, I reevaluated the patient and found patient tolerating p.o. and otherwise hemodynamically stable.  Reviewed work-up with him.  He understands necessity of following up with his oncologist for further imaging and treatment.  We will provide prescriptions for pain medicine for patient.  Return instructions discussed   Final Clinical Impression(s) / ED Diagnoses Final diagnoses:  RUQ abdominal pain  Liver mass    Rx / DC Orders ED Discharge Orders          Ordered    HYDROcodone-acetaminophen  (NORCO/VICODIN) 5-325 MG tablet  Every 6 hours PRN        12/10/20 1818             Hayden Rasmussen, MD 12/11/20 1034

## 2020-12-12 ENCOUNTER — Telehealth: Payer: Self-pay | Admitting: Oncology

## 2020-12-12 NOTE — Telephone Encounter (Signed)
Called and left detailed message for patient regarding New Patient appointment.  Letter & Calendar have been mailed as well

## 2020-12-13 ENCOUNTER — Telehealth: Payer: Self-pay | Admitting: *Deleted

## 2020-12-13 NOTE — Telephone Encounter (Signed)
Left VM requesting to be seen due to abdominal pain "due to my liver".  Called back and informed male answering his phone that he has appointment with Dr. Benay Spice on 8/31 at 0910. Noted that ER physician provided hydrocodone-apap.

## 2020-12-14 ENCOUNTER — Inpatient Hospital Stay: Payer: BC Managed Care – PPO | Attending: Nurse Practitioner | Admitting: Oncology

## 2020-12-14 ENCOUNTER — Inpatient Hospital Stay: Payer: BC Managed Care – PPO

## 2020-12-14 ENCOUNTER — Other Ambulatory Visit: Payer: Self-pay

## 2020-12-14 VITALS — BP 148/76 | HR 70 | Temp 97.9°F | Resp 18 | Ht 62.0 in | Wt 160.6 lb

## 2020-12-14 DIAGNOSIS — F17200 Nicotine dependence, unspecified, uncomplicated: Secondary | ICD-10-CM | POA: Insufficient documentation

## 2020-12-14 DIAGNOSIS — C787 Secondary malignant neoplasm of liver and intrahepatic bile duct: Secondary | ICD-10-CM | POA: Diagnosis not present

## 2020-12-14 DIAGNOSIS — C7951 Secondary malignant neoplasm of bone: Secondary | ICD-10-CM | POA: Diagnosis not present

## 2020-12-14 DIAGNOSIS — C61 Malignant neoplasm of prostate: Secondary | ICD-10-CM | POA: Insufficient documentation

## 2020-12-14 DIAGNOSIS — D696 Thrombocytopenia, unspecified: Secondary | ICD-10-CM | POA: Diagnosis not present

## 2020-12-14 DIAGNOSIS — F32A Depression, unspecified: Secondary | ICD-10-CM | POA: Insufficient documentation

## 2020-12-14 DIAGNOSIS — Z8619 Personal history of other infectious and parasitic diseases: Secondary | ICD-10-CM | POA: Diagnosis not present

## 2020-12-14 DIAGNOSIS — R160 Hepatomegaly, not elsewhere classified: Secondary | ICD-10-CM | POA: Diagnosis not present

## 2020-12-14 DIAGNOSIS — K769 Liver disease, unspecified: Secondary | ICD-10-CM | POA: Insufficient documentation

## 2020-12-14 LAB — PROTIME-INR
INR: 0.9 (ref 0.8–1.2)
Prothrombin Time: 12.5 seconds (ref 11.4–15.2)

## 2020-12-14 MED ORDER — OXYCODONE-ACETAMINOPHEN 5-325 MG PO TABS: 1.0000 | ORAL_TABLET | ORAL | 0 refills | Status: DC | PRN

## 2020-12-14 NOTE — Progress Notes (Signed)
[facility] OFFICE PROGRESS NOTE   Diagnosis: Prostate cancer  INTERVAL HISTORY:   Edgar Mooney returns prior to a scheduled visit.  He continues abiraterone/prednisone.  He developed right upper abdominal pain beginning 12/08/2020.  He was seen in the emergency room on 12/10/2020.  A CT revealed a right liver mass.  He continues to have pain.  Hydrocodone does not relieve the pain.  He has been taking 2 hydrocodone tablets as needed.  The pain is worse with yawning and deep inspiration.  Objective:  Vital signs in last 24 hours:  Blood pressure (!) 148/76, pulse 70, temperature 97.9 F (36.6 C), temperature source Oral, resp. rate 18, height '5\' 2"'$  (1.575 m), weight 160 lb 9.6 oz (72.8 kg), SpO2 99 %.   Resp: Lungs clear bilaterally Cardio: Regular rate and rhythm GI: No mass, no hepatosplenomegaly, tender in the right subcostal region and right upper lateral abdomen Vascular: No leg edema   Lab Results:  Lab Results  Component Value Date   WBC 8.7 12/10/2020   HGB 13.4 12/10/2020   HCT 38.8 (L) 12/10/2020   MCV 91.3 12/10/2020   PLT 167 12/10/2020   NEUTROABS 4.0 10/03/2020    CMP  Lab Results  Component Value Date   NA 139 12/10/2020   K 4.2 12/10/2020   CL 108 12/10/2020   CO2 23 12/10/2020   GLUCOSE 112 (H) 12/10/2020   BUN 10 12/10/2020   CREATININE 0.74 12/10/2020   CALCIUM 9.4 12/10/2020   PROT 6.8 12/10/2020   ALBUMIN 3.9 12/10/2020   AST 49 (H) 12/10/2020   ALT 52 (H) 12/10/2020   ALKPHOS 75 12/10/2020   BILITOT 1.0 12/10/2020   GFRNONAA >60 12/10/2020   GFRAA 121 12/19/2017    No results found for: CEA1, CEA, CAN199, CA125  Lab Results  Component Value Date   INR 0.9 12/14/2020   LABPROT 12.5 12/14/2020    Imaging:  CT ABDOMEN PELVIS W CONTRAST  Addendum Date: 12/10/2020   ADDENDUM REPORT: 12/10/2020 14:58 ADDENDUM: Of note, the hepatic mass could be further evaluated with a multiphase MR abdomen without and with contrast.  Electronically Signed   By: Zerita Boers M.D.   On: 12/10/2020 14:58   Result Date: 12/10/2020 CLINICAL DATA:  Right lower quadrant pain. The patient has a history of hepatitis C cirrhosis and prostate cancer. EXAM: CT ABDOMEN AND PELVIS WITH CONTRAST TECHNIQUE: Multidetector CT imaging of the abdomen and pelvis was performed using the standard protocol following bolus administration of intravenous contrast. CONTRAST:  21m OMNIPAQUE IOHEXOL 350 MG/ML SOLN COMPARISON:  CT abdomen pelvis dated 03/03/2020. FINDINGS: Lower chest: Mild bilateral dependent atelectasis. Hepatobiliary: An enhancing mass in the right hepatic lobe measures 9.1 x 9.2 x 9.2 cm and appears to demonstrate washout enhancement on delayed phase imaging. This was not seen on prior exam. No gallstones, gallbladder wall thickening, or biliary dilatation. Pancreas: Calcifications in the head of the pancreas are unchanged and likely reflect chronic pancreatitis. No pancreatic ductal dilatation or surrounding inflammatory changes. Spleen: Normal in size without focal abnormality. Adrenals/Urinary Tract: Adrenal glands are unremarkable. Kidneys are normal, without renal calculi, focal lesion, or hydronephrosis. The urinary bladder wall appears thickened for the degree of distension. Stomach/Bowel: Stomach is within normal limits. Appendix appears normal. No evidence of bowel wall thickening, distention, or inflammatory changes. Vascular/Lymphatic: Aortic atherosclerosis. A lymph node to the left of the inferior vena cava near the porta hepatis measures 1.2 cm in short axis (series 3, image 24 and  may be slightly enlarged compared to 03/03/2020. No enlarged pelvic lymph nodes. Reproductive: Prostate is unremarkable. Other: There is a small amount of peritoneal fluid near the hepatic mass extending along the right pericolic gutter. No abdominal wall hernia or abnormality. Musculoskeletal: Numerous sclerotic lesions throughout the spine and pelvis appear  progressed since prior exam. Degenerative changes are seen in the spine. Previously seen metastatic disease involving the ventral epidural space at the level of S1 is not appreciated on today's exam. IMPRESSION: 1. Enhancing mass in the right hepatic lobe is concerning for hepatocellular carcinoma given the apparent washout on delayed phase and reported history of hepatitis C cirrhosis. Metastatic disease from the patient's reported prostate cancer is considered less likely. 2. Urinary bladder wall thickening for the degree of distension may reflect cystitis or chronic bladder outlet obstruction. 3. Numerous sclerotic lesions throughout the spine and pelvis appear progressed since 03/03/2020, likely representing metastatic disease. Electronically Signed: By: Zerita Boers M.D. On: 12/10/2020 14:53    Medications: I have reviewed the patient's current medications.   Assessment/Plan:  1. Metastatic prostate cancer - Lytic bone lesions with retroperitoneal adenopathy concerning for metastatic prostate cancer -03/03/2020-CTA chest/abdomen/pelvis widespread osseous metastatic disease and lower retroperitoneal adenopathy, pathologic right anterior third rib fracture, probable spinal canal tumor at the level of S1, -MRI cervical, thoracic, and lumbar spine 03/03/2020-diffuse osseous metastatic disease, ventral epidural tumor impinging on right S1 nerve root, extraosseous tumor at the bilateral ilium, asymmetric enhancing material at the right C5-6 canal-right  -03/03/2020-PSA 763 -Degarelix 03/04/2020 -Abiraterone/prednisone 03/14/2020 -Every 77-monthLupron 04/01/2020 -04/01/2020 PSA 36.5 -05/31/2020 PSA 1.2 -06/28/2020 PSA 1.0 2.  Severe anemia-likely secondary to metastatic prostate cancer involving the bones 3.  Mild thrombocytopenia 4.  Cirrhosis with splenomegaly 5.  History of hepatitis C 6.  History of polysubstance abuse 7.  Depression 8.  Tobacco dependence 9.  Right arm weakness, right  facial numbness-potentially related to nerve root compromise from metastatic bone lesions 10.  Pain secondary to #1 11.  Extensive bone metastases-every 370-monthometa starting 04/01/2020 12.  Gout-acute flare right wrist 04/01/2020 treated with indomethacin, allopurinol resumed       Disposition: Mr. MaDeboiseas metastatic prostate cancer.  He continues abiraterone/prednisone and every 3-85-monthuprolide.  He has new right upper abdominal pain, likely secondary to the right liver mass.  I suspect the mass is related to hepatocellular carcinoma.  This would be a very unlikely presentation for metastatic prostate cancer.  I reviewed the CT images and discussed the differential diagnosis with Mr. ManRonchettiHe will be referred for an MRI of the liver to better characterize the right liver mass.  If the mass appears to represent an [facility]St [facility] interventional [facility]ology to consider hepatic directed therapy.  We check an AFP today.  He will try oxycodone/APAP as needed for pain.  Mr. ManDowisll return for an office visit after the MRI.  GarBetsy CoderD  12/14/2020  1:28 PM

## 2020-12-15 LAB — PROSTATE-SPECIFIC AG, SERUM (LABCORP): Prostate Specific Ag, Serum: 6.4 ng/mL — ABNORMAL HIGH (ref 0.0–4.0)

## 2020-12-15 LAB — AFP TUMOR MARKER: AFP, Serum, Tumor Marker: 47628 ng/mL — ABNORMAL HIGH (ref 0.0–8.4)

## 2020-12-19 ENCOUNTER — Inpatient Hospital Stay (HOSPITAL_COMMUNITY)
Admission: EM | Admit: 2020-12-19 | Discharge: 2020-12-23 | DRG: 981 | Disposition: A | Discharge status: Home / Self Care | Payer: BC Managed Care – PPO | Attending: Internal Medicine | Admitting: Internal Medicine

## 2020-12-19 ENCOUNTER — Other Ambulatory Visit: Payer: Self-pay | Admitting: Hematology and Oncology

## 2020-12-19 ENCOUNTER — Other Ambulatory Visit: Payer: Self-pay

## 2020-12-19 ENCOUNTER — Encounter (HOSPITAL_COMMUNITY): Payer: Self-pay | Admitting: Emergency Medicine

## 2020-12-19 ENCOUNTER — Observation Stay (HOSPITAL_COMMUNITY): Payer: BC Managed Care – PPO

## 2020-12-19 ENCOUNTER — Emergency Department (HOSPITAL_COMMUNITY): Payer: BC Managed Care – PPO

## 2020-12-19 DIAGNOSIS — B182 Chronic viral hepatitis C: Secondary | ICD-10-CM | POA: Diagnosis present

## 2020-12-19 DIAGNOSIS — Z79899 Other long term (current) drug therapy: Secondary | ICD-10-CM | POA: Diagnosis not present

## 2020-12-19 DIAGNOSIS — Z20822 Contact with and (suspected) exposure to covid-19: Secondary | ICD-10-CM | POA: Diagnosis not present

## 2020-12-19 DIAGNOSIS — Z809 Family history of malignant neoplasm, unspecified: Secondary | ICD-10-CM

## 2020-12-19 DIAGNOSIS — C7949 Secondary malignant neoplasm of other parts of nervous system: Secondary | ICD-10-CM | POA: Diagnosis present

## 2020-12-19 DIAGNOSIS — R079 Chest pain, unspecified: Secondary | ICD-10-CM | POA: Diagnosis not present

## 2020-12-19 DIAGNOSIS — K8020 Calculus of gallbladder without cholecystitis without obstruction: Secondary | ICD-10-CM | POA: Diagnosis not present

## 2020-12-19 DIAGNOSIS — F32A Depression, unspecified: Secondary | ICD-10-CM | POA: Diagnosis not present

## 2020-12-19 DIAGNOSIS — M50221 Other cervical disc displacement at C4-C5 level: Secondary | ICD-10-CM | POA: Diagnosis not present

## 2020-12-19 DIAGNOSIS — M109 Gout, unspecified: Secondary | ICD-10-CM | POA: Diagnosis present

## 2020-12-19 DIAGNOSIS — F1010 Alcohol abuse, uncomplicated: Secondary | ICD-10-CM | POA: Diagnosis not present

## 2020-12-19 DIAGNOSIS — I25110 Atherosclerotic heart disease of native coronary artery with unstable angina pectoris: Secondary | ICD-10-CM | POA: Diagnosis present

## 2020-12-19 DIAGNOSIS — F1414 Cocaine abuse with cocaine-induced mood disorder: Secondary | ICD-10-CM | POA: Diagnosis present

## 2020-12-19 DIAGNOSIS — Z599 Problem related to housing and economic circumstances, unspecified: Secondary | ICD-10-CM | POA: Diagnosis not present

## 2020-12-19 DIAGNOSIS — F1419 Cocaine abuse with unspecified cocaine-induced disorder: Secondary | ICD-10-CM | POA: Diagnosis not present

## 2020-12-19 DIAGNOSIS — I200 Unstable angina: Secondary | ICD-10-CM | POA: Diagnosis not present

## 2020-12-19 DIAGNOSIS — J9811 Atelectasis: Secondary | ICD-10-CM | POA: Diagnosis not present

## 2020-12-19 DIAGNOSIS — M545 Low back pain, unspecified: Secondary | ICD-10-CM | POA: Diagnosis not present

## 2020-12-19 DIAGNOSIS — K769 Liver disease, unspecified: Secondary | ICD-10-CM | POA: Diagnosis present

## 2020-12-19 DIAGNOSIS — F1910 Other psychoactive substance abuse, uncomplicated: Secondary | ICD-10-CM | POA: Diagnosis present

## 2020-12-19 DIAGNOSIS — R739 Hyperglycemia, unspecified: Secondary | ICD-10-CM | POA: Diagnosis not present

## 2020-12-19 DIAGNOSIS — D630 Anemia in neoplastic disease: Secondary | ICD-10-CM | POA: Diagnosis not present

## 2020-12-19 DIAGNOSIS — Z955 Presence of coronary angioplasty implant and graft: Secondary | ICD-10-CM

## 2020-12-19 DIAGNOSIS — C7951 Secondary malignant neoplasm of bone: Secondary | ICD-10-CM | POA: Diagnosis not present

## 2020-12-19 DIAGNOSIS — Z5948 Other specified lack of adequate food: Secondary | ICD-10-CM | POA: Diagnosis not present

## 2020-12-19 DIAGNOSIS — T405X1A Poisoning by cocaine, accidental (unintentional), initial encounter: Secondary | ICD-10-CM | POA: Diagnosis not present

## 2020-12-19 DIAGNOSIS — D649 Anemia, unspecified: Secondary | ICD-10-CM | POA: Diagnosis present

## 2020-12-19 DIAGNOSIS — D696 Thrombocytopenia, unspecified: Secondary | ICD-10-CM | POA: Diagnosis present

## 2020-12-19 DIAGNOSIS — F1011 Alcohol abuse, in remission: Secondary | ICD-10-CM | POA: Diagnosis present

## 2020-12-19 DIAGNOSIS — I214 Non-ST elevation (NSTEMI) myocardial infarction: Secondary | ICD-10-CM | POA: Diagnosis present

## 2020-12-19 DIAGNOSIS — F17210 Nicotine dependence, cigarettes, uncomplicated: Secondary | ICD-10-CM | POA: Diagnosis present

## 2020-12-19 DIAGNOSIS — I499 Cardiac arrhythmia, unspecified: Secondary | ICD-10-CM | POA: Diagnosis not present

## 2020-12-19 DIAGNOSIS — C61 Malignant neoplasm of prostate: Secondary | ICD-10-CM | POA: Diagnosis present

## 2020-12-19 DIAGNOSIS — R160 Hepatomegaly, not elsewhere classified: Secondary | ICD-10-CM | POA: Diagnosis not present

## 2020-12-19 DIAGNOSIS — M47812 Spondylosis without myelopathy or radiculopathy, cervical region: Secondary | ICD-10-CM | POA: Diagnosis not present

## 2020-12-19 DIAGNOSIS — C221 Intrahepatic bile duct carcinoma: Secondary | ICD-10-CM | POA: Diagnosis not present

## 2020-12-19 DIAGNOSIS — K7460 Unspecified cirrhosis of liver: Secondary | ICD-10-CM | POA: Diagnosis present

## 2020-12-19 DIAGNOSIS — K7689 Other specified diseases of liver: Secondary | ICD-10-CM | POA: Diagnosis not present

## 2020-12-19 DIAGNOSIS — Z8546 Personal history of malignant neoplasm of prostate: Secondary | ICD-10-CM | POA: Diagnosis not present

## 2020-12-19 DIAGNOSIS — R0789 Other chest pain: Secondary | ICD-10-CM | POA: Diagnosis not present

## 2020-12-19 DIAGNOSIS — I10 Essential (primary) hypertension: Secondary | ICD-10-CM | POA: Diagnosis not present

## 2020-12-19 DIAGNOSIS — R61 Generalized hyperhidrosis: Secondary | ICD-10-CM | POA: Diagnosis not present

## 2020-12-19 LAB — LIPID PANEL
Cholesterol: 201 mg/dL — ABNORMAL HIGH (ref 0–200)
HDL: 36 mg/dL — ABNORMAL LOW (ref >40)
LDL Cholesterol: 115 mg/dL — ABNORMAL HIGH (ref 0–99)
Total CHOL/HDL Ratio: 5.6 RATIO
Triglycerides: 248 mg/dL — ABNORMAL HIGH (ref <150)
VLDL: 50 mg/dL — ABNORMAL HIGH (ref 0–40)

## 2020-12-19 LAB — CBC
HCT: 38.2 % — ABNORMAL LOW (ref 39.0–52.0)
Hemoglobin: 12.6 g/dL — ABNORMAL LOW (ref 13.0–17.0)
MCH: 31.5 pg (ref 26.0–34.0)
MCHC: 33 g/dL (ref 30.0–36.0)
MCV: 95.5 fL (ref 80.0–100.0)
Platelets: 175 10*3/uL (ref 150–400)
RBC: 4 MIL/uL — ABNORMAL LOW (ref 4.22–5.81)
RDW: 12.5 % (ref 11.5–15.5)
WBC: 6.9 10*3/uL (ref 4.0–10.5)
nRBC: 0 % (ref 0.0–0.2)

## 2020-12-19 LAB — COMPREHENSIVE METABOLIC PANEL
ALT: 38 U/L (ref 0–44)
AST: 34 U/L (ref 15–41)
Albumin: 3.3 g/dL — ABNORMAL LOW (ref 3.5–5.0)
Alkaline Phosphatase: 62 U/L (ref 38–126)
Anion gap: 7 (ref 5–15)
BUN: 14 mg/dL (ref 6–20)
CO2: 20 mmol/L — ABNORMAL LOW (ref 22–32)
Calcium: 9.2 mg/dL (ref 8.9–10.3)
Chloride: 111 mmol/L (ref 98–111)
Creatinine, Ser: 0.71 mg/dL (ref 0.61–1.24)
GFR, Estimated: >60 mL/min (ref >60)
Glucose, Bld: 106 mg/dL — ABNORMAL HIGH (ref 70–99)
Potassium: 3.8 mmol/L (ref 3.5–5.1)
Sodium: 138 mmol/L (ref 135–145)
Total Bilirubin: 0.4 mg/dL (ref 0.3–1.2)
Total Protein: 6.2 g/dL — ABNORMAL LOW (ref 6.5–8.1)

## 2020-12-19 LAB — ECHOCARDIOGRAM COMPLETE
Area-P 1/2: 3 cm2
Height: 62 in
S' Lateral: 3.2 cm
Weight: 2560 oz

## 2020-12-19 LAB — HEMOGLOBIN A1C
Hgb A1c MFr Bld: 5.1 % (ref 4.8–5.6)
Mean Plasma Glucose: 99.67 mg/dL

## 2020-12-19 LAB — MAGNESIUM: Magnesium: 2 mg/dL (ref 1.7–2.4)

## 2020-12-19 LAB — TROPONIN I (HIGH SENSITIVITY)
Troponin I (High Sensitivity): 46 ng/L — ABNORMAL HIGH (ref <18)
Troponin I (High Sensitivity): 97 ng/L — ABNORMAL HIGH (ref <18)

## 2020-12-19 LAB — SARS CORONAVIRUS 2 (TAT 6-24 HRS): SARS Coronavirus 2: NEGATIVE

## 2020-12-19 IMAGING — MR MR LUMBAR SPINE WO/W CM
4 of 7 series · 21 of 48 positions shown · IV contrast (gadavist)
Comparison: Prior MRIs from [DATE].

CLINICAL DATA: Initial evaluation for intermittent left-sided chest
pain, history of metastatic prostate cancer.

EXAM:
MRI CERVICAL, THORACIC AND LUMBAR SPINE WITHOUT AND WITH CONTRAST
TECHNIQUE: Multiplanar and multiecho pulse sequences of the cervical spine, to
include the craniocervical junction and cervicothoracic junction,
and thoracic and lumbar spine, were obtained without and with
intravenous contrast.
CONTRAST:  7mL GADAVIST GADOBUTROL 1 MMOL/ML IV SOLN

[Series 2: T2 · sagittal · 4.0mm · 0.73mm/px · 4 of 18 slices shown (1 of 2)]
[im 1/18]
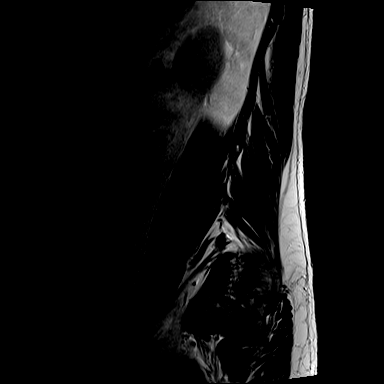
[im 6/18]
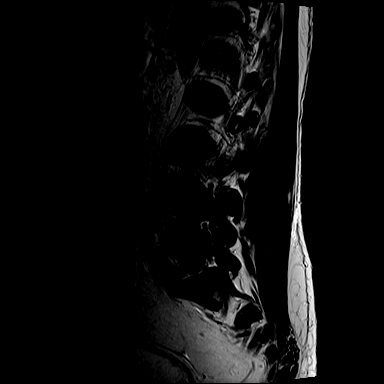
[im 12/18]
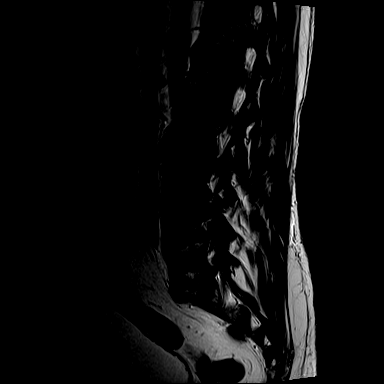
[im 18/18]
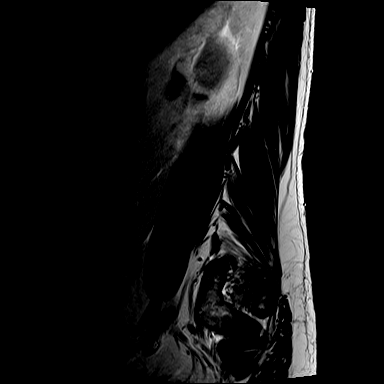

[Series 3: T1 · sagittal · 4.0mm · 0.88mm/px · 4 of 18 slices shown (1 of 2)]
[im 1/18]
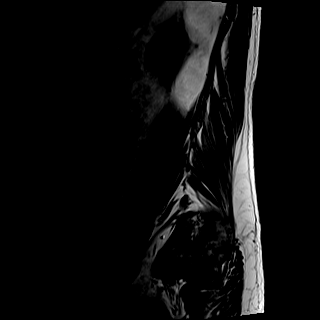
[im 6/18]
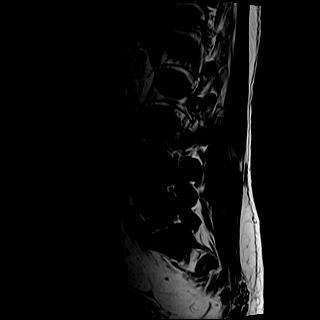
[im 12/18]
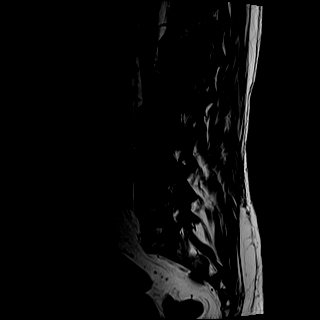
[im 18/18]
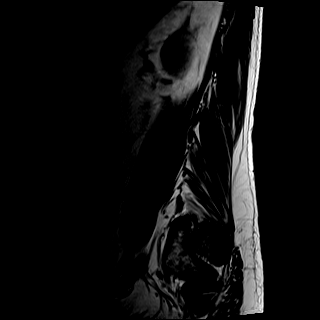

[Series 5: T2 · axial · 4.0mm · 0.69mm/px · z∈[-517,-308]mm · 8 of 36 slices shown (2 of 2)]
[im 1/36]
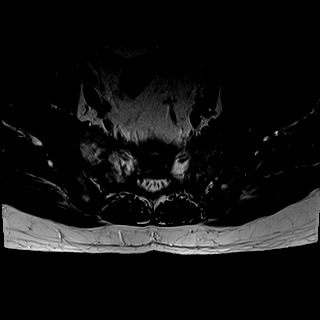
[im 4/36]
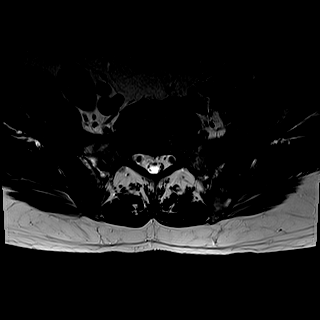
[im 12/36]
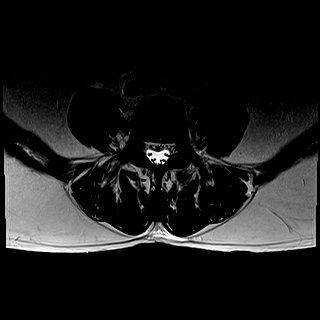
[im 16/36]
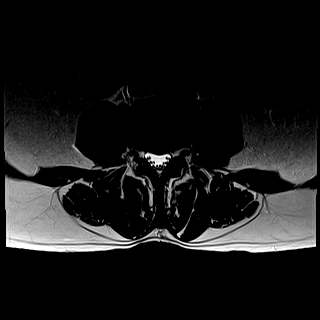
[im 20/36]
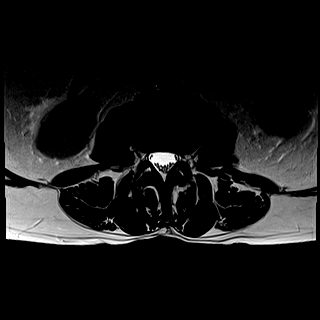
[im 24/36]
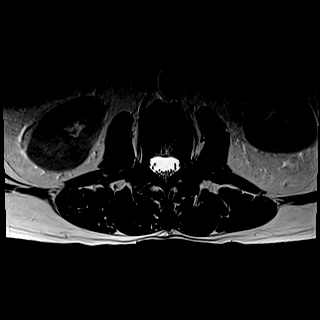
[im 32/36]
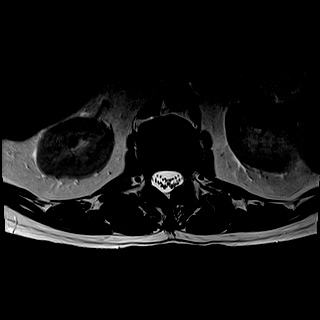
[im 36/36]
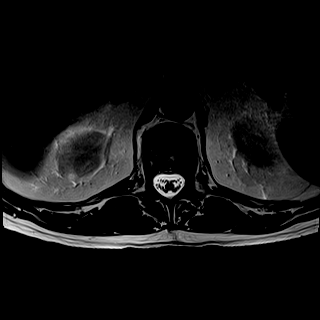

[Series 6: T1 · axial · 4.0mm · 0.34mm/px · z∈[-517,-327]mm · 5 of 36 slices shown (2 of 2)]
[im 1/36]
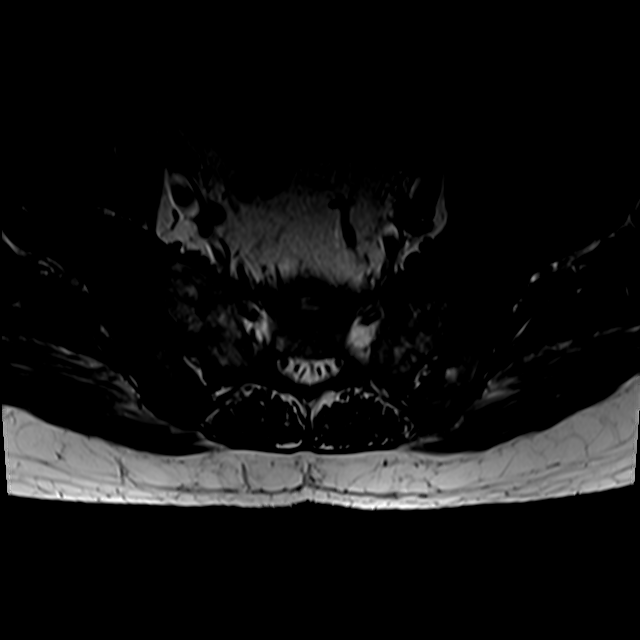
[im 4/36]
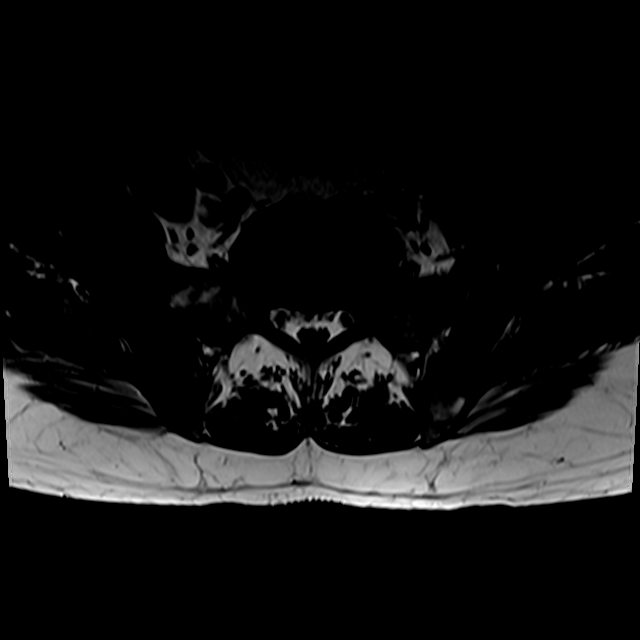
[im 12/36]
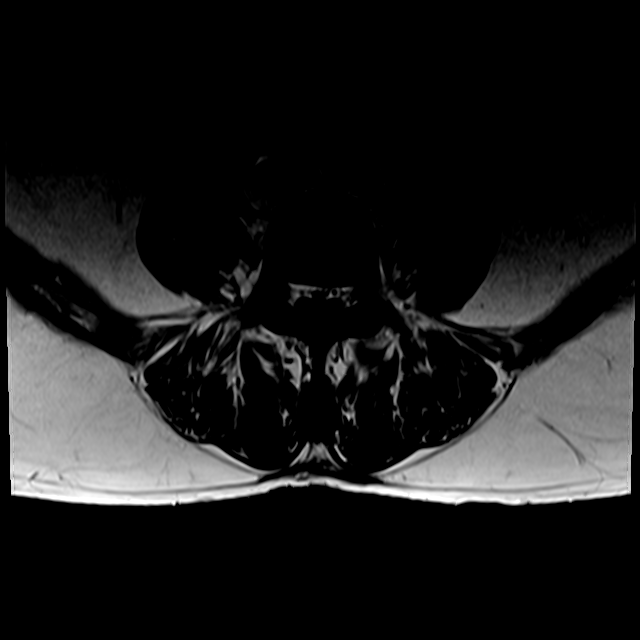
[im 20/36]
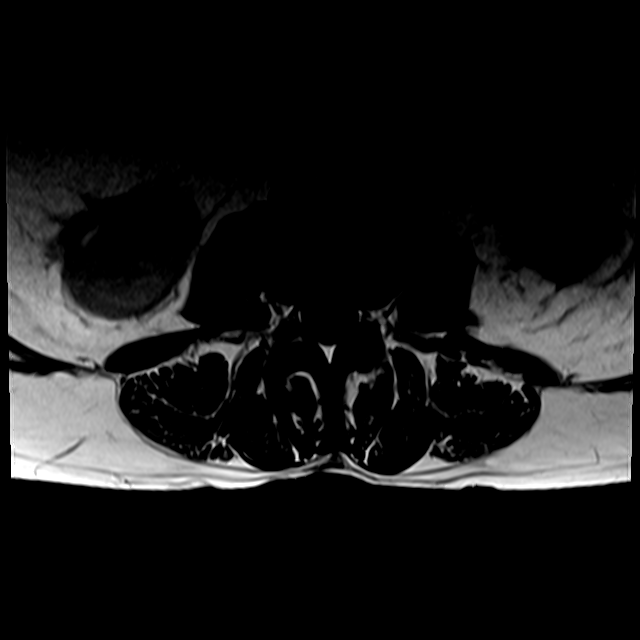
[im 32/36]
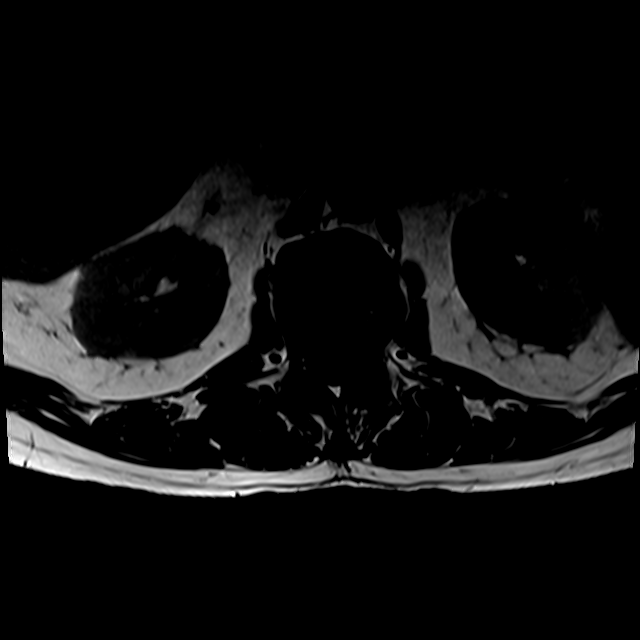

[21 of 48 positions shown; findings below may reference images not displayed]

FINDINGS: MRI CERVICAL SPINE FINDINGS

Alignment: Mild straightening of the normal cervical lordosis. No
listhesis.

Vertebrae: Diffuse infiltrative low marrow signal intensity again
seen throughout the visualized skull base and spine. Previously seen
superimposed enhancing lesions are markedly improved as compared to
previous exam, compatible with interval response to therapy.
Appearance is most evident at the posterior arch of C2 where only a
small focus of enhancement is now seen on today's exam, previously
fairly prominent on prior MRI. No definite new or progressive
lesions. Vertebral body height maintained without pathologic or
interval fracture. Previously question extra osseous tumor on the
right at C5-6 is not convincingly seen on today's exam. No other new
extra osseous extension. No epidural or intracanalicular involvement
identified.

Cord: Normal signal and morphology. No abnormal enhancement or
intracanalicular involvement of tumor.

Posterior Fossa, vertebral arteries, paraspinal tissues: Empty sella
partially visualized. Visualized portions of the brain and posterior
fossa otherwise unremarkable. Paraspinous and prevertebral soft
tissues within normal limits. Normal flow voids seen within the
vertebral arteries bilaterally.

Disc levels:

C2-C3: Negative interspace. Mild left-sided facet hypertrophy. No
stenosis.

C3-C4: Broad posterior disc osteophyte flattens the ventral thecal
sac with resultant mild-to-moderate spinal stenosis. Superimposed
uncovertebral and facet hypertrophy with resultant moderate left
with mild right C4 foraminal stenosis.

C4-C5: Mild disc bulge with uncovertebral spurring. Bilateral facet
hypertrophy. No significant spinal stenosis. Moderate right with
mild left C5 foraminal narrowing.

C5-C6: Degenerative intervertebral disc space narrowing with diffuse
disc osteophyte complex. Flattening and partial effacement of the
ventral thecal sac, eccentric to the right. Mild spinal stenosis.
Severe right worse than left C6 foraminal narrowing.

C6-C7: Disc bulge with uncovertebral spurring. Superimposed left
paracentral disc protrusion indents the left ventral thecal sac.
Mild spinal stenosis. Mild to moderate left with mild right C7
foraminal narrowing.

C7-T1:  Normal interspace.  No canal or foraminal stenosis.

MRI THORACIC SPINE FINDINGS

Alignment: Physiologic with preservation of the normal thoracic
kyphosis. No listhesis.

Vertebrae: Diffusely abnormal appearance of the visualized osseous
structures, consistent with widespread osseous metastatic disease.
Overall appearance is markedly improved as compared to previous
exam, with decreased heterogeneous enhancement and STIR signal
abnormality seen throughout the thoracic spine. Previously seen
trace epidural tumor posterior to the T12 vertebral body is no
longer visualized. No new extra osseous or epidural tumor
identified. Vertebral body height maintained without pathologic or
interval fracture.

Cord: Normal signal and morphology. No epidural or intracanalicular
tumor. No abnormal enhancement.

Paraspinal and other soft tissues: Paraspinous soft tissues within
normal limits. Splenomegaly partially visualized.

Disc levels:

No new significant disc pathology. No significant stenosis or neural
impingement.

MRI LUMBAR SPINE FINDINGS

Segmentation: Standard. Lowest well-formed disc space labeled the
L5-S1 level.

Alignment: Physiologic with preservation of the normal lumbar
lordosis. No listhesis.

Vertebrae: Widespread osseous metastatic disease again seen
throughout the visualized lumbar spine and pelvis. Overall,
appearance is markedly improved with decreased heterogeneous
enhancement seen throughout the affected structures as compared to
prior. Vertebral body height maintained without interval or
pathologic fracture. Previously seen epidural tumor posterior to the
S1 vertebral body has largely resolved, and is no longer seen.
Similarly, previously seen extraosseous extension from the bilateral
ilium is also no longer clearly visualized. No significant
extraosseous extension now seen. No significant epidural or
intracanalicular involvement.

Conus medullaris and cauda equina: Conus extends to the T12 level.
Conus and cauda equina appear normal.

Paraspinal and other soft tissues: Interval resolution in previously
seen bulky retroperitoneal adenopathy.

Disc levels:

L1-2:  Unremarkable.

L2-3:  Negative interspace.  Mild facet hypertrophy.  No stenosis.

L3-4: Disc bulge with mild facet hypertrophy. No spinal stenosis.
Mild left L3 foraminal narrowing.

L4-5: Degenerative intervertebral disc space narrowing with diffuse
disc bulge and reactive endplate spurring. Mild to moderate facet
hypertrophy. Resultant mild canal with moderate bilateral lateral
recess stenosis. Moderate right with mild left L4 foraminal
narrowing.

L5-S1: Degenerative intervertebral disc space narrowing with diffuse
disc bulge and reactive endplate spurring. Superimposed left
subarticular disc protrusion contacts and mildly displaces the
descending left S1 nerve root. Mild-to-moderate facet hypertrophy.
Resultant moderate left with mild right lateral recess stenosis.
Mild bilateral L5 foraminal narrowing.
IMPRESSION: MRI CERVICAL SPINE IMPRESSION:

1. Interval improvement in appearance of widespread osseous
metastatic disease, consistent with interval response to therapy. No
pathologic fracture. Previously seen extraosseous/epidural tumor at
C5-6 is no longer clearly visualized, with no extraosseous or
epidural tumor now seen.
2. Underlying multilevel cervical spondylosis with resultant
mild-to-moderate spinal stenosis at C3-4 through C6-7 as above.
Moderate left C4 and right C5 foraminal stenosis, with severe
bilateral C6 foraminal narrowing.

MRI THORACIC SPINE IMPRESSION:

Marked interval improvement in appearance of widespread osseous
metastatic disease, consistent with interval response to therapy. No
pathologic fracture. No extra osseous or epidural tumor identified.

MRI LUMBAR SPINE IMPRESSION:

1. Interval improvement in widespread osseous metastatic disease
involving the lumbar spine and visualized pelvis. No pathologic
fracture. Previously seen epidural tumor at the level of S1 has
resolved as has the previously seen extraosseous tumor extension
from the bilateral ilium. No significant epidural or extraosseous
disease now seen.
2. Interval improvement/resolution in previously seen
retroperitoneal adenopathy.
3. Left subarticular disc protrusion at L5-S1, potentially affecting
the left S1 nerve root. Additional more mild degenerative
spondylosis elsewhere within the lumbar spine as above.

## 2020-12-19 IMAGING — CR DG CHEST 2V
2 series · 2 of 2 positions shown · non-contrast
Comparison: [DATE]

CLINICAL DATA: Chest pain, diaphoresis

EXAM:
CHEST - 2 VIEW

[chest pa]
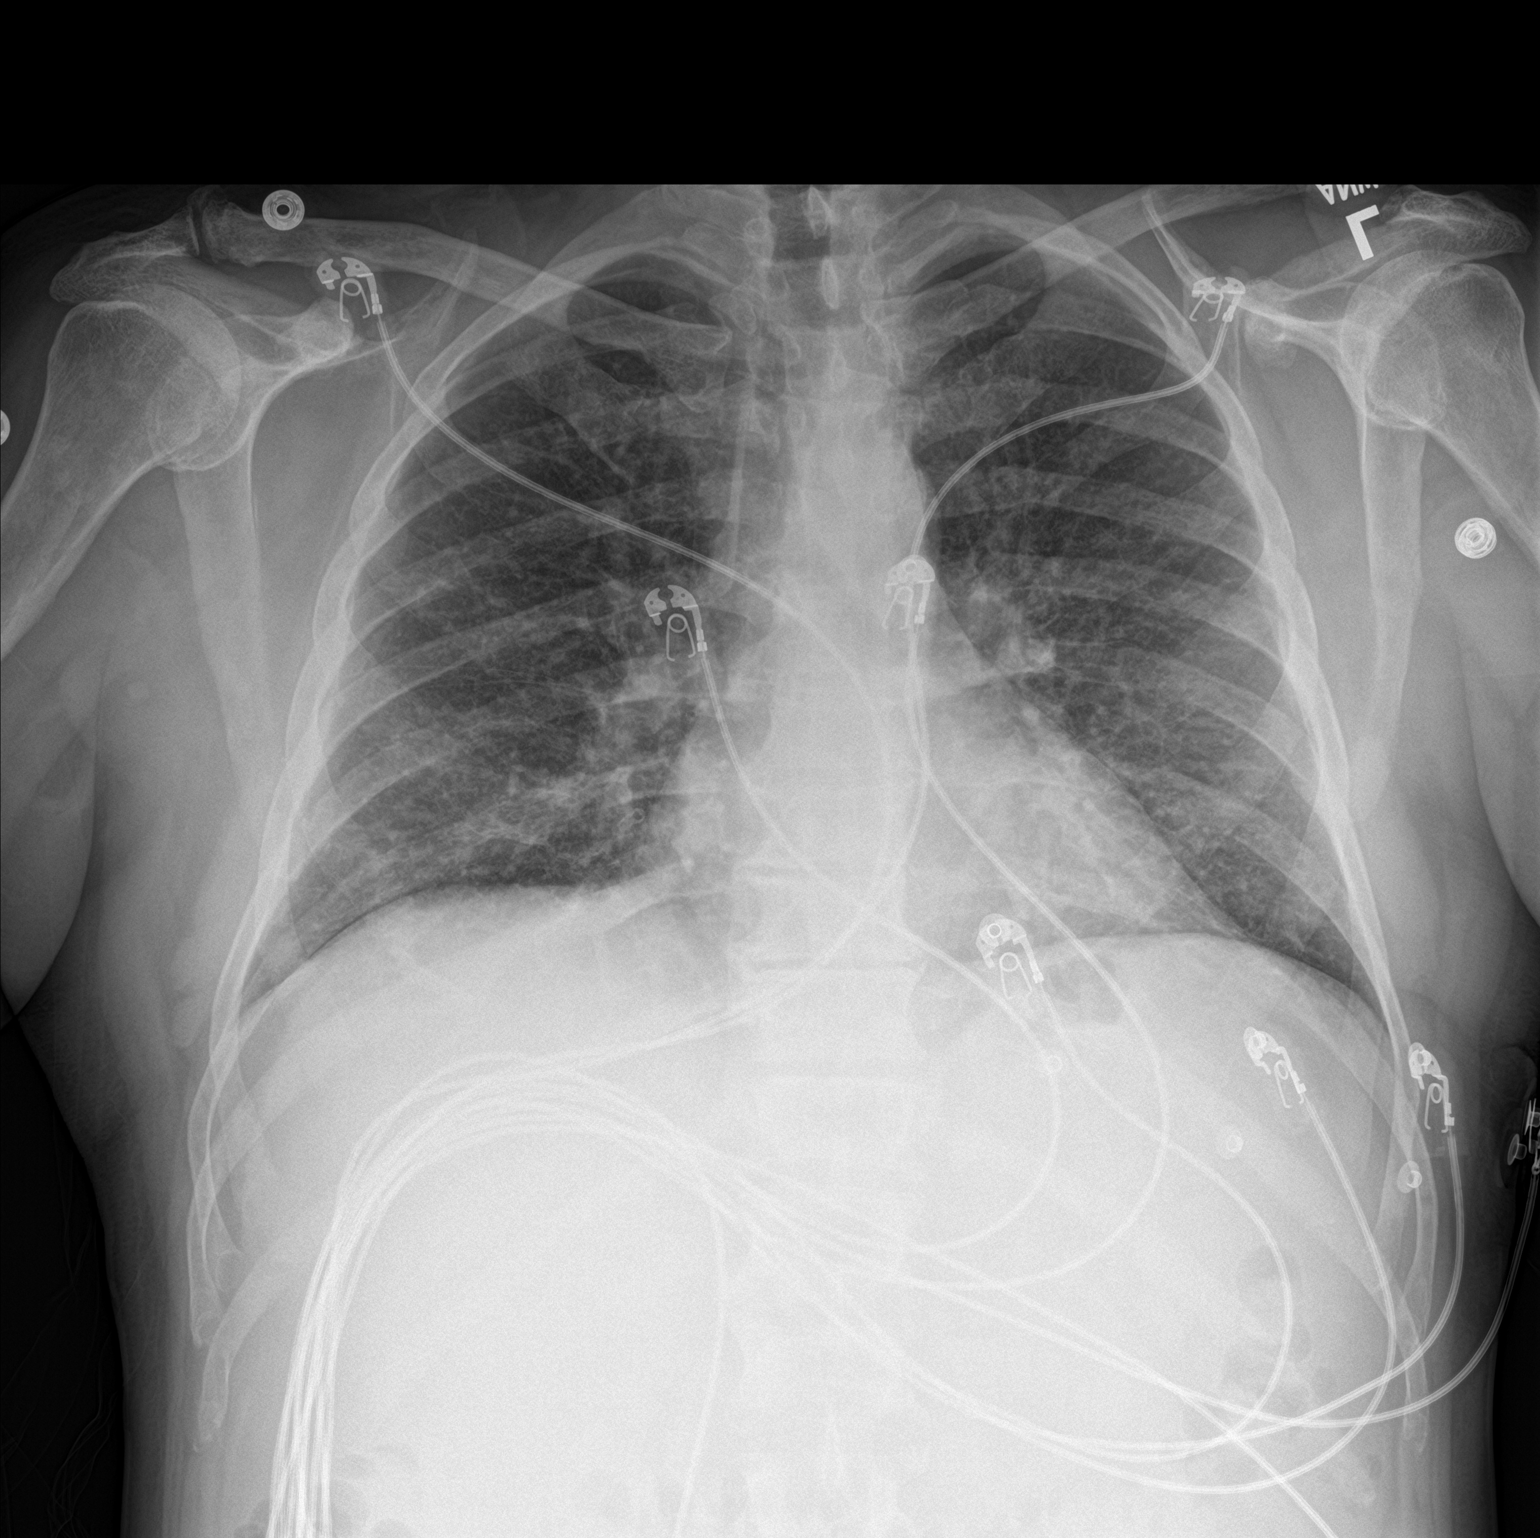

[chest lat]
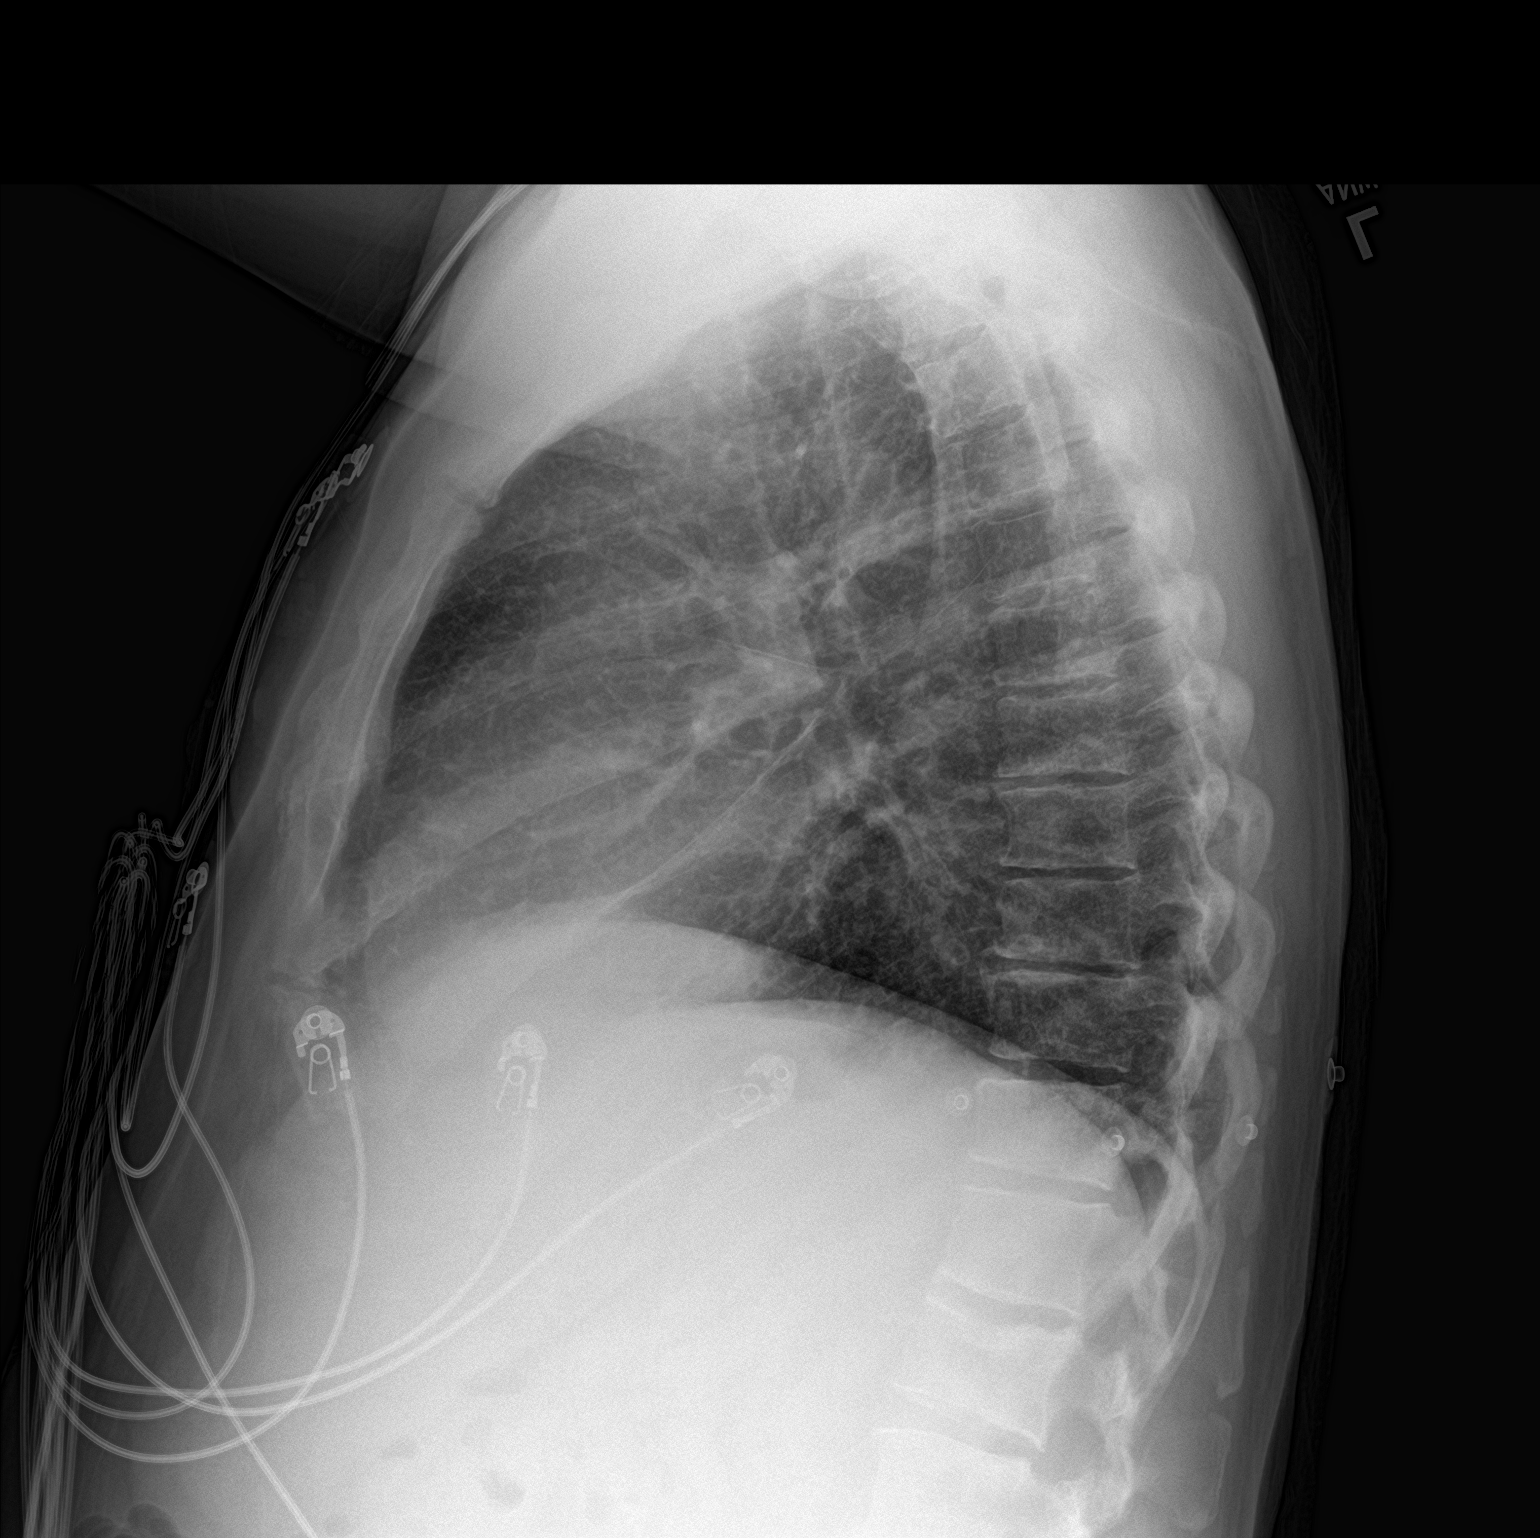

[2 of 2 positions shown; findings below may reference images not displayed]

FINDINGS: Lower lung volumes with similar diffuse bilateral interstitial
opacities may represent chronic lung disease. Minor basilar
atelectasis. Normal heart size and vascularity. No definite focal
pneumonia, collapse or consolidation. Negative for effusion or
pneumothorax. Trachea midline. Degenerative changes of the spine.
IMPRESSION: Similar chronic interstitial lung changes with lower lung volumes.
No definite superimposed acute process.

## 2020-12-19 IMAGING — MR MR CERVICAL SPINE WO/W CM
4 of 8 series · 23 of 48 positions shown · IV contrast (gadavist)
Comparison: Prior MRIs from [DATE].

CLINICAL DATA: Initial evaluation for intermittent left-sided chest
pain, history of metastatic prostate cancer.

EXAM:
MRI CERVICAL, THORACIC AND LUMBAR SPINE WITHOUT AND WITH CONTRAST
TECHNIQUE: Multiplanar and multiecho pulse sequences of the cervical spine, to
include the craniocervical junction and cervicothoracic junction,
and thoracic and lumbar spine, were obtained without and with
intravenous contrast.
CONTRAST:  7mL GADAVIST GADOBUTROL 1 MMOL/ML IV SOLN

[Series 5: T2 · sagittal · 3.0mm · 0.69mm/px · 3 of 17 slices shown (1 of 2)]
[im 1/17]
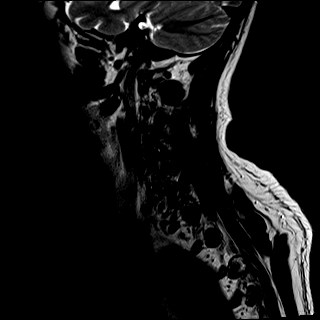
[im 9/17]
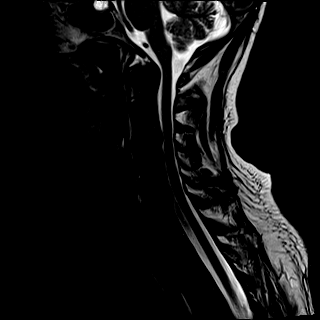
[im 17/17]
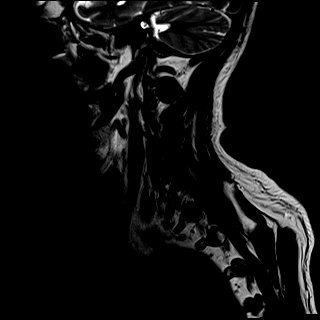

[Series 7: T1 · sagittal · 3.0mm · 0.69mm/px · 3 of 17 slices shown (1 of 2)]
[im 1/17]
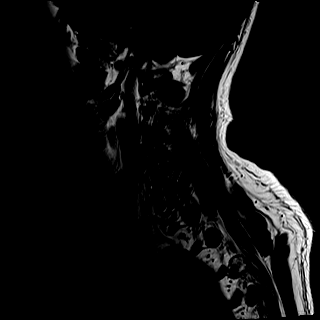
[im 9/17]
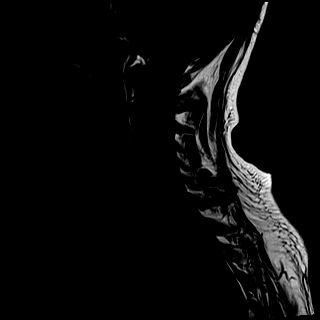
[im 17/17]
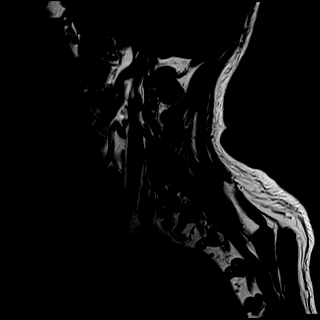

[Series 8: T2 · axial · 3.0mm · 0.66mm/px · z∈[-96,+48]mm · 9 of 45 slices shown (2 of 2)]
[im 1/45]
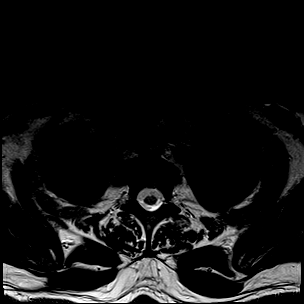
[im 6/45]
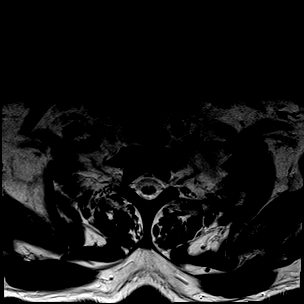
[im 12/45]
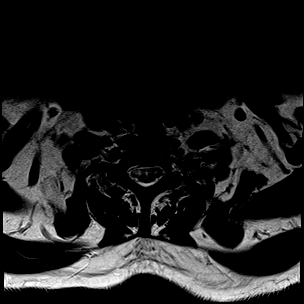
[im 17/45]
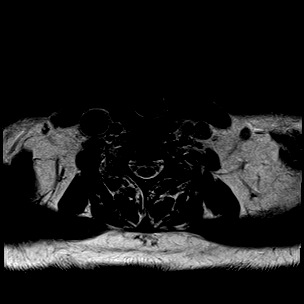
[im 23/45]
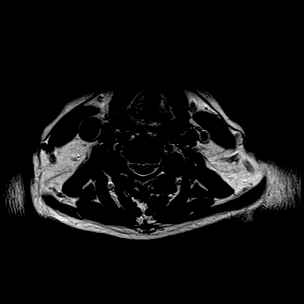
[im 28/45]
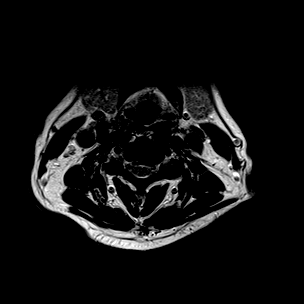
[im 34/45]
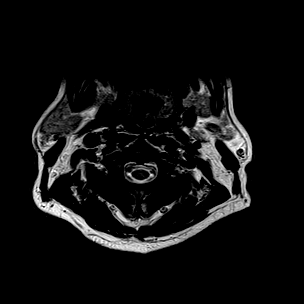
[im 39/45]
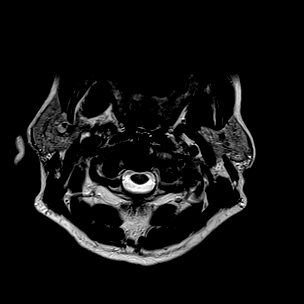
[im 45/45]
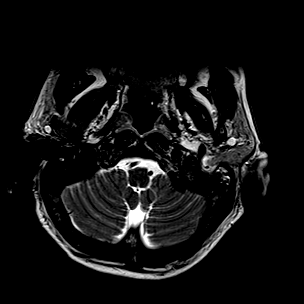

[Series 10: T1 · axial · 3.0mm · 0.39mm/px · z∈[-96,+28]mm · 8 of 45 slices shown (2 of 2)]
[im 1/45]
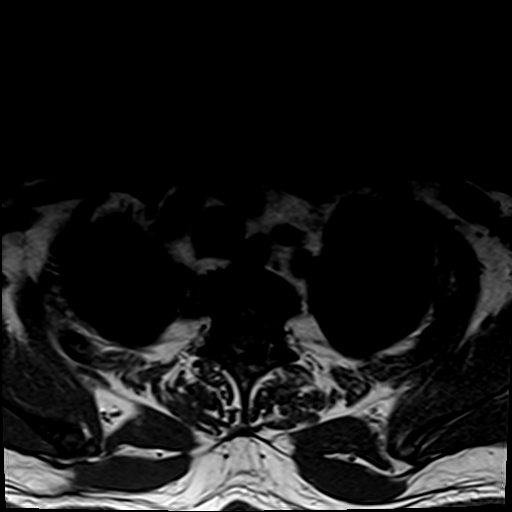
[im 6/45]
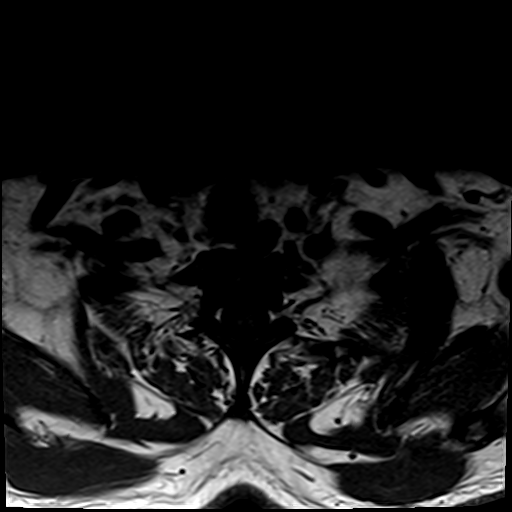
[im 12/45]
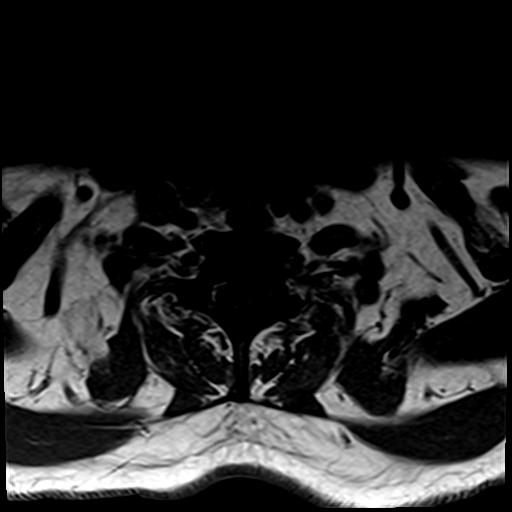
[im 17/45]
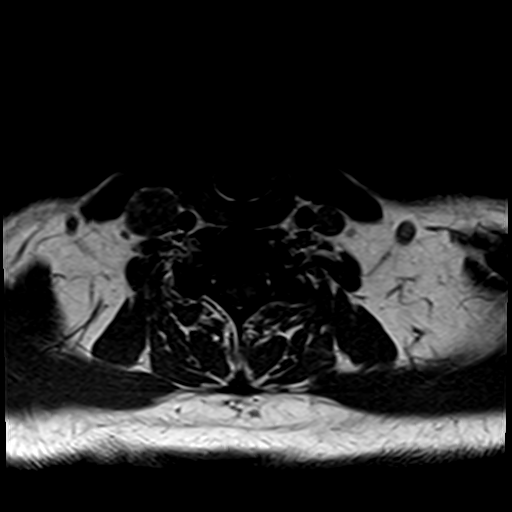
[im 23/45]
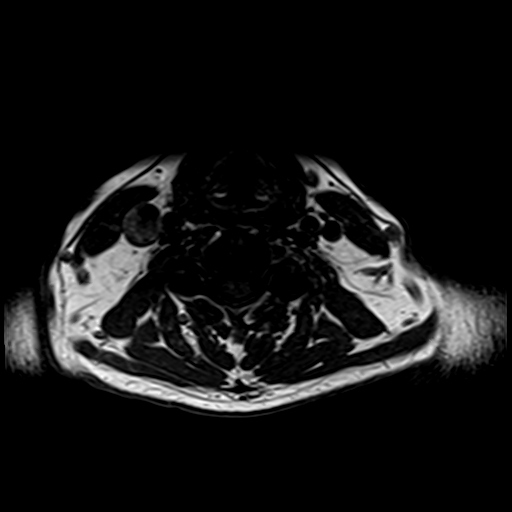
[im 28/45]
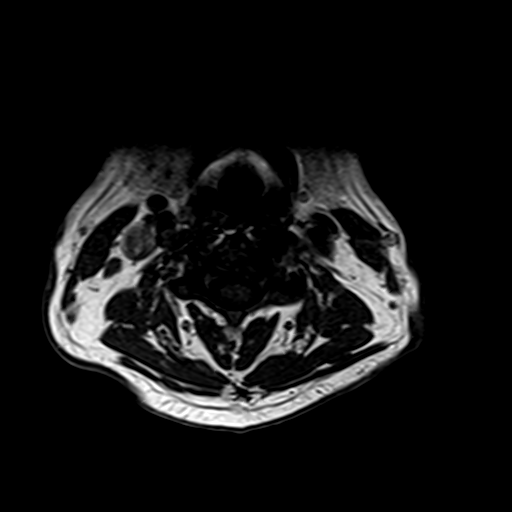
[im 34/45]
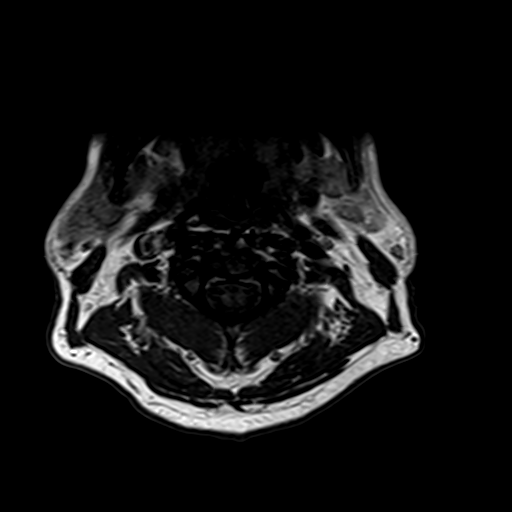
[im 39/45]
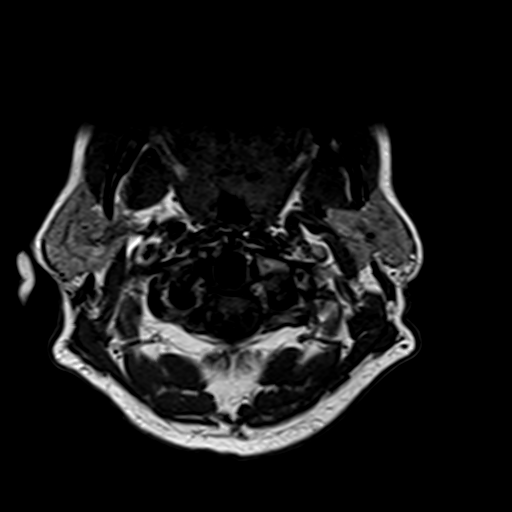

[23 of 48 positions shown; findings below may reference images not displayed]

FINDINGS: MRI CERVICAL SPINE FINDINGS

Alignment: Mild straightening of the normal cervical lordosis. No
listhesis.

Vertebrae: Diffuse infiltrative low marrow signal intensity again
seen throughout the visualized skull base and spine. Previously seen
superimposed enhancing lesions are markedly improved as compared to
previous exam, compatible with interval response to therapy.
Appearance is most evident at the posterior arch of C2 where only a
small focus of enhancement is now seen on today's exam, previously
fairly prominent on prior MRI. No definite new or progressive
lesions. Vertebral body height maintained without pathologic or
interval fracture. Previously question extra osseous tumor on the
right at C5-6 is not convincingly seen on today's exam. No other new
extra osseous extension. No epidural or intracanalicular involvement
identified.

Cord: Normal signal and morphology. No abnormal enhancement or
intracanalicular involvement of tumor.

Posterior Fossa, vertebral arteries, paraspinal tissues: Empty sella
partially visualized. Visualized portions of the brain and posterior
fossa otherwise unremarkable. Paraspinous and prevertebral soft
tissues within normal limits. Normal flow voids seen within the
vertebral arteries bilaterally.

Disc levels:

C2-C3: Negative interspace. Mild left-sided facet hypertrophy. No
stenosis.

C3-C4: Broad posterior disc osteophyte flattens the ventral thecal
sac with resultant mild-to-moderate spinal stenosis. Superimposed
uncovertebral and facet hypertrophy with resultant moderate left
with mild right C4 foraminal stenosis.

C4-C5: Mild disc bulge with uncovertebral spurring. Bilateral facet
hypertrophy. No significant spinal stenosis. Moderate right with
mild left C5 foraminal narrowing.

C5-C6: Degenerative intervertebral disc space narrowing with diffuse
disc osteophyte complex. Flattening and partial effacement of the
ventral thecal sac, eccentric to the right. Mild spinal stenosis.
Severe right worse than left C6 foraminal narrowing.

C6-C7: Disc bulge with uncovertebral spurring. Superimposed left
paracentral disc protrusion indents the left ventral thecal sac.
Mild spinal stenosis. Mild to moderate left with mild right C7
foraminal narrowing.

C7-T1:  Normal interspace.  No canal or foraminal stenosis.

MRI THORACIC SPINE FINDINGS

Alignment: Physiologic with preservation of the normal thoracic
kyphosis. No listhesis.

Vertebrae: Diffusely abnormal appearance of the visualized osseous
structures, consistent with widespread osseous metastatic disease.
Overall appearance is markedly improved as compared to previous
exam, with decreased heterogeneous enhancement and STIR signal
abnormality seen throughout the thoracic spine. Previously seen
trace epidural tumor posterior to the T12 vertebral body is no
longer visualized. No new extra osseous or epidural tumor
identified. Vertebral body height maintained without pathologic or
interval fracture.

Cord: Normal signal and morphology. No epidural or intracanalicular
tumor. No abnormal enhancement.

Paraspinal and other soft tissues: Paraspinous soft tissues within
normal limits. Splenomegaly partially visualized.

Disc levels:

No new significant disc pathology. No significant stenosis or neural
impingement.

MRI LUMBAR SPINE FINDINGS

Segmentation: Standard. Lowest well-formed disc space labeled the
L5-S1 level.

Alignment: Physiologic with preservation of the normal lumbar
lordosis. No listhesis.

Vertebrae: Widespread osseous metastatic disease again seen
throughout the visualized lumbar spine and pelvis. Overall,
appearance is markedly improved with decreased heterogeneous
enhancement seen throughout the affected structures as compared to
prior. Vertebral body height maintained without interval or
pathologic fracture. Previously seen epidural tumor posterior to the
S1 vertebral body has largely resolved, and is no longer seen.
Similarly, previously seen extraosseous extension from the bilateral
ilium is also no longer clearly visualized. No significant
extraosseous extension now seen. No significant epidural or
intracanalicular involvement.

Conus medullaris and cauda equina: Conus extends to the T12 level.
Conus and cauda equina appear normal.

Paraspinal and other soft tissues: Interval resolution in previously
seen bulky retroperitoneal adenopathy.

Disc levels:

L1-2:  Unremarkable.

L2-3:  Negative interspace.  Mild facet hypertrophy.  No stenosis.

L3-4: Disc bulge with mild facet hypertrophy. No spinal stenosis.
Mild left L3 foraminal narrowing.

L4-5: Degenerative intervertebral disc space narrowing with diffuse
disc bulge and reactive endplate spurring. Mild to moderate facet
hypertrophy. Resultant mild canal with moderate bilateral lateral
recess stenosis. Moderate right with mild left L4 foraminal
narrowing.

L5-S1: Degenerative intervertebral disc space narrowing with diffuse
disc bulge and reactive endplate spurring. Superimposed left
subarticular disc protrusion contacts and mildly displaces the
descending left S1 nerve root. Mild-to-moderate facet hypertrophy.
Resultant moderate left with mild right lateral recess stenosis.
Mild bilateral L5 foraminal narrowing.
IMPRESSION: MRI CERVICAL SPINE IMPRESSION:

1. Interval improvement in appearance of widespread osseous
metastatic disease, consistent with interval response to therapy. No
pathologic fracture. Previously seen extraosseous/epidural tumor at
C5-6 is no longer clearly visualized, with no extraosseous or
epidural tumor now seen.
2. Underlying multilevel cervical spondylosis with resultant
mild-to-moderate spinal stenosis at C3-4 through C6-7 as above.
Moderate left C4 and right C5 foraminal stenosis, with severe
bilateral C6 foraminal narrowing.

MRI THORACIC SPINE IMPRESSION:

Marked interval improvement in appearance of widespread osseous
metastatic disease, consistent with interval response to therapy. No
pathologic fracture. No extra osseous or epidural tumor identified.

MRI LUMBAR SPINE IMPRESSION:

1. Interval improvement in widespread osseous metastatic disease
involving the lumbar spine and visualized pelvis. No pathologic
fracture. Previously seen epidural tumor at the level of S1 has
resolved as has the previously seen extraosseous tumor extension
from the bilateral ilium. No significant epidural or extraosseous
disease now seen.
2. Interval improvement/resolution in previously seen
retroperitoneal adenopathy.
3. Left subarticular disc protrusion at L5-S1, potentially affecting
the left S1 nerve root. Additional more mild degenerative
spondylosis elsewhere within the lumbar spine as above.

## 2020-12-19 MED ORDER — ACETAMINOPHEN 325 MG PO TABS: 650.0000 mg | ORAL_TABLET | ORAL | Status: DC | PRN

## 2020-12-19 MED ORDER — SODIUM CHLORIDE 0.9 % WEIGHT BASED INFUSION
3.0000 mL/kg/h | INTRAVENOUS | Status: DC
  Administered 2020-12-20: 3 mL/kg/h via INTRAVENOUS

## 2020-12-19 MED ORDER — ASPIRIN 81 MG PO CHEW
81.0000 mg | CHEWABLE_TABLET | ORAL | Status: AC
  Administered 2020-12-20: 81 mg via ORAL
  Filled 2020-12-19: qty 1

## 2020-12-19 MED ORDER — OXYCODONE-ACETAMINOPHEN 5-325 MG PO TABS
1.0000 | ORAL_TABLET | ORAL | Status: DC | PRN
  Administered 2020-12-20 – 2020-12-23 (×11): 1 via ORAL
  Filled 2020-12-19 (×12): qty 1

## 2020-12-19 MED ORDER — TAMSULOSIN HCL 0.4 MG PO CAPS
0.4000 mg | ORAL_CAPSULE | Freq: Every day | ORAL | Status: DC
  Administered 2020-12-20 – 2020-12-23 (×4): 0.4 mg via ORAL
  Filled 2020-12-19 (×4): qty 1

## 2020-12-19 MED ORDER — NITROGLYCERIN 0.4 MG SL SUBL: 0.4000 mg | SUBLINGUAL_TABLET | SUBLINGUAL | Status: DC | PRN

## 2020-12-19 MED ORDER — SODIUM CHLORIDE 0.9% FLUSH: 3.0000 mL | INTRAVENOUS | Status: DC | PRN

## 2020-12-19 MED ORDER — PREDNISONE 5 MG PO TABS
5.0000 mg | ORAL_TABLET | Freq: Every day | ORAL | Status: DC
  Administered 2020-12-20 – 2020-12-22 (×3): 5 mg via ORAL
  Filled 2020-12-19 (×3): qty 1

## 2020-12-19 MED ORDER — ASPIRIN 81 MG PO CHEW
324.0000 mg | CHEWABLE_TABLET | ORAL | Status: AC
  Administered 2020-12-19: 324 mg via ORAL
  Filled 2020-12-19: qty 4

## 2020-12-19 MED ORDER — ONDANSETRON HCL 4 MG/2ML IJ SOLN: 4.0000 mg | Freq: Four times a day (QID) | INTRAMUSCULAR | Status: DC | PRN

## 2020-12-19 MED ORDER — SODIUM CHLORIDE 0.9% FLUSH
3.0000 mL | Freq: Two times a day (BID) | INTRAVENOUS | Status: DC
  Administered 2020-12-20 (×2): 3 mL via INTRAVENOUS

## 2020-12-19 MED ORDER — ABIRATERONE ACETATE 250 MG PO TABS: 1000.0000 mg | ORAL_TABLET | Freq: Every day | ORAL | Status: DC

## 2020-12-19 MED ORDER — SODIUM CHLORIDE 0.9 % IV SOLN: 250.0000 mL | INTRAVENOUS | Status: DC | PRN

## 2020-12-19 MED ORDER — ASPIRIN 81 MG PO CHEW
324.0000 mg | CHEWABLE_TABLET | Freq: Once | ORAL | Status: DC
  Filled 2020-12-19: qty 4

## 2020-12-19 MED ORDER — SODIUM CHLORIDE 0.9 % WEIGHT BASED INFUSION
1.0000 mL/kg/h | INTRAVENOUS | Status: DC
  Administered 2020-12-20: 1 mL/kg/h via INTRAVENOUS

## 2020-12-19 MED ORDER — ASPIRIN EC 81 MG PO TBEC
81.0000 mg | DELAYED_RELEASE_TABLET | Freq: Every day | ORAL | Status: DC
  Administered 2020-12-20 – 2020-12-21 (×2): 81 mg via ORAL
  Filled 2020-12-19 (×2): qty 1

## 2020-12-19 MED ORDER — NITROGLYCERIN 0.4 MG SL SUBL
0.4000 mg | SUBLINGUAL_TABLET | SUBLINGUAL | Status: DC | PRN
  Administered 2020-12-19 (×2): 0.4 mg via SUBLINGUAL
  Filled 2020-12-19: qty 1

## 2020-12-19 MED ORDER — SODIUM CHLORIDE 0.9% FLUSH: 3.0000 mL | Freq: Two times a day (BID) | INTRAVENOUS | Status: DC

## 2020-12-19 MED ORDER — ASPIRIN 300 MG RE SUPP: 300.0000 mg | RECTAL | Status: AC

## 2020-12-19 NOTE — ED Notes (Signed)
ECHO at bedside.

## 2020-12-19 NOTE — ED Notes (Addendum)
Admitting team at bedside with pt. I drew pt's blood from IV. Pt eating a sandwich, ok'd by ED PA. Pt AxO x4.

## 2020-12-19 NOTE — ED Notes (Signed)
Pt transported to floor via stretcher via transport via monitor.

## 2020-12-19 NOTE — Progress Notes (Signed)
  Echocardiogram 2D Echocardiogram has been performed.  Edgar Mooney 12/19/2020, 5:22 PM

## 2020-12-19 NOTE — Progress Notes (Signed)
Patient not seen We were just called about some clearance regarding use of anticoagulation in this patient with spinal mets and prostate cancer. He had no recent MRI imaging Most recent CT abdomen pelvis shows osseous mets, S1 level epidural space mass was not appreciated on this exam. We would recommend repeat imaging before further recommendations. Discussed with Benedetto Goad from ER, she kindly re ordered the imaging. Sent a high priority inbasket message to Dr Benay Spice, Dr Benay Spice will cover his service tomorrow. Please feel free to reach out to Dr Benay Spice directly in AM. Hopefully MRI's will be performed today.  Edgar Mooney

## 2020-12-19 NOTE — ED Notes (Signed)
Pt ate 80% of meal tray.

## 2020-12-19 NOTE — Consult Note (Signed)
CARDIOLOGY CONSULT NOTE       Patient ID: Edgar Mooney MRN: [id number] DOB/AGE: [birth year] 55 y.o.  Admit date: 12/19/2020 Referring Physician: Marijean Bravo [facility] Primary Physician: Patient, No Pcp Per (Inactive) Primary Cardiologist: New Reason for Consultation: Chest pain elevated troponin  Principal Problem:   Unstable angina ([place]) Active Problems:   HTN (hypertension)   History of ETOH abuse   Gout   Hep C w/o coma, chronic (HCC)   Polysubstance abuse (HCC)   Cocaine abuse with cocaine-induced mood disorder (HCC)   Anemia   Prostate cancer (HCC)   HPI:  55 y.o. with metastatic prostate cancer admitted with chest pain. He has had anginal sounding pain for 3 months Now consistent with unstable angina More frequent episodes.  Initially with exertion and now more resting pain. Relief with nitro in ER. Associated nausea, dyspnea, diaphoresis and radiation to left arm.  ECG no acute changes or ST elevation Pain free now On narcotics for bone mets at baseline Caveat is on 03/04/20 MRI showed epidural ventral mets to sacral and C spine. Recent CT abdomen 12/10/20 also showed new liver malignancy ? Met vs primary hepatocellular cancer with history of ETOH abuse He was to have a f/u MRI of liver to further evaluate   ROS All other systems reviewed and negative except as noted above  Past Medical History:  Diagnosis Date   Alcohol abuse    Cocaine abuse (HCC)    Depression    Gout    Hep C w/o coma, chronic ([place]) diagnosed May 2016    Family History  Problem Relation Age of Onset   Cancer Mother    Cancer Father     Social History   Socioeconomic History   Marital status: Single    Spouse name: Not on file   Number of children: Not on file   Years of education: Not on file   Highest education level: Not on file  Occupational History   Not on file  Tobacco Use   Smoking status: Every Day    Packs/day: 1.00    Years: 25.00    Pack years: 25.00    Types: Cigarettes   Smokeless  tobacco: Never  Substance and Sexual Activity   Alcohol use: Not Currently    Alcohol/week: 126.0 standard drinks    Types: 126 Cans of beer per week    Comment: 18 pack per day   Drug use: Yes    Types: Cocaine, IV, Heroin, Marijuana    Comment: Used crack and cocaine 04/02/16   Sexual activity: Not Currently  Other Topics Concern   Not on file  Social History Narrative   Not on file   Social Determinants of Health   Financial Resource Strain: High Risk   Difficulty of Paying Living Expenses: Hard  Food Insecurity: Food Insecurity Present   Worried About Running Out of Food in the Last Year: Sometimes true   Ran Out of Food in the Last Year: Never true  Transportation Needs: No Transportation Needs   Lack of Transportation (Medical): No   Lack of Transportation (Non-Medical): No  Physical Activity: Not on file  Stress: Not on file  Social Connections: Moderately Isolated   Frequency of Communication with Friends and Family: Once a week   Frequency of Social Gatherings with Friends and Family: Once a week   Attends Religious Services: 1 to 4 times per year   Active Member of [employer] or Organizations: No   Attends Archivist Meetings:  1 to 4 times per year   Marital Status: Separated  Intimate Partner Violence: Not At Risk   Fear of Current or Ex-Partner: No   Emotionally Abused: No   Physically Abused: No   Sexually Abused: No    History reviewed. No pertinent surgical history.    Current Facility-Administered Medications:    nitroGLYCERIN (NITROSTAT) SL tablet 0.4 mg, 0.4 mg, Sublingual, Q5 min PRN, Sponseller, Rebekah R, PA-C, 0.4 mg at 12/19/20 1429  Current Outpatient Medications:    abiraterone acetate (ZYTIGA) 250 MG tablet, TAKE 4 TABLETS (1,000 MG TOTAL) BY MOUTH DAILY. TAKE ON AN EMPTY STOMACH 1 HOUR BEFORE OR 2 HOURS AFTER A MEAL, Disp: 120 tablet, Rfl: 0   allopurinol (ZYLOPRIM) 100 MG tablet, TAKE 1 TABLET BY MOUTH EVERY DAY START ON 12/24, Disp: 90  tablet, Rfl: 1   ferrous gluconate (FERGON) 324 MG tablet, Take 1 tablet (324 mg total) by mouth 2 (two) times daily with a meal. (Patient not taking: No sig reported), Disp: 30 tablet, Rfl: 3   indomethacin (INDOCIN) 50 MG capsule, Take 1 capsule (50 mg total) by mouth 3 (three) times daily with meals. For gout flare (Patient not taking: No sig reported), Disp: 15 capsule, Rfl: 0   ondansetron (ZOFRAN) 4 MG tablet, Take 1 tablet (4 mg total) by mouth every 6 (six) hours as needed for nausea. (Patient not taking: No sig reported), Disp: 20 tablet, Rfl: 0   oxyCODONE-acetaminophen (PERCOCET/ROXICET) 5-325 MG tablet, Take 1 tablet by mouth every 4 (four) hours as needed for severe pain., Disp: 60 tablet, Rfl: 0   predniSONE (DELTASONE) 5 MG tablet, TAKE 1 TABLET (5 MG TOTAL) BY MOUTH DAILY WITH BREAKFAST., Disp: 30 tablet, Rfl: 5   tamsulosin (FLOMAX) 0.4 MG CAPS capsule, TAKE 1 CAPSULE BY MOUTH EVERY DAY AFTER SUPPER, Disp: 90 capsule, Rfl: 1    Physical Exam: Blood pressure 120/80, pulse (!) 54, temperature 98 F (36.7 C), temperature source Oral, resp. rate 15, height $RemoveBe'5\' 2"'cQOEuNhTw$  (1.575 m), weight 72.6 kg, SpO2 93 %.    Affect appropriate Chronically ill white male  HEENT: normal Neck supple with no adenopathy JVP normal no bruits no thyromegaly Lungs clear with no wheezing and good diaphragmatic motion Heart:  S1/S2 no murmur, no rub, gallop or click PMI normal Abdomen: benighn, BS positve, no tenderness, no AAA no bruit.  No HSM or HJR Distal pulses intact with no bruits No edema Neuro non-focal Skin warm and dry No muscular weakness   Labs:   Lab Results  Component Value Date   WBC 6.9 12/19/2020   HGB 12.6 (L) 12/19/2020   HCT 38.2 (L) 12/19/2020   MCV 95.5 12/19/2020   PLT 175 12/19/2020    Recent Labs  Lab 12/19/20 1158  NA 138  K 3.8  CL 111  CO2 20*  BUN 14  CREATININE 0.71  CALCIUM 9.2  PROT 6.2*  BILITOT 0.4  ALKPHOS 62  ALT 38  AST 34  GLUCOSE 106*   No  results found for: CKTOTAL, CKMB, CKMBINDEX, TROPONINI  Lab Results  Component Value Date   CHOL 196 12/19/2017   Lab Results  Component Value Date   HDL 29 (L) 12/19/2017   Lab Results  Component Value Date   LDLCALC Comment 12/19/2017   Lab Results  Component Value Date   TRIG 251 (H) 04/01/2020   TRIG 409 (H) 12/19/2017   Lab Results  Component Value Date   CHOLHDL 6.8 (H) 12/19/2017   Lab  Results  Component Value Date   LDLDIRECT 94 02/27/2018      Radiology: DG Chest 2 View  Result Date: 12/19/2020 CLINICAL DATA:  Chest pain, diaphoresis EXAM: CHEST - 2 VIEW COMPARISON:  03/03/2020 FINDINGS: Lower lung volumes with similar diffuse bilateral interstitial opacities may represent chronic lung disease. Minor basilar atelectasis. Normal heart size and vascularity. No definite focal pneumonia, collapse or consolidation. Negative for effusion or pneumothorax. Trachea midline. Degenerative changes of the spine. IMPRESSION: Similar chronic interstitial lung changes with lower lung volumes. No definite superimposed acute process. Electronically Signed   By: [name].  Shick M.D.   On: 12/19/2020 12:45   CT ABDOMEN PELVIS W CONTRAST  Addendum Date: 12/10/2020   ADDENDUM REPORT: 12/10/2020 14:58 ADDENDUM: Of note, the hepatic mass could be further evaluated with a multiphase MR abdomen without and with contrast. Electronically Signed   By: Zerita Boers M.D.   On: 12/10/2020 14:58   Result Date: 12/10/2020 CLINICAL DATA:  Right lower quadrant pain. The patient has a history of hepatitis C cirrhosis and prostate cancer. EXAM: CT ABDOMEN AND PELVIS WITH CONTRAST TECHNIQUE: Multidetector CT imaging of the abdomen and pelvis was performed using the standard protocol following bolus administration of intravenous contrast. CONTRAST:  9mL OMNIPAQUE IOHEXOL 350 MG/ML SOLN COMPARISON:  CT abdomen pelvis dated 03/03/2020. FINDINGS: Lower chest: Mild bilateral dependent atelectasis. Hepatobiliary: An  enhancing mass in the right hepatic lobe measures 9.1 x 9.2 x 9.2 cm and appears to demonstrate washout enhancement on delayed phase imaging. This was not seen on prior exam. No gallstones, gallbladder wall thickening, or biliary dilatation. Pancreas: Calcifications in the head of the pancreas are unchanged and likely reflect chronic pancreatitis. No pancreatic ductal dilatation or surrounding inflammatory changes. Spleen: Normal in size without focal abnormality. Adrenals/Urinary Tract: Adrenal glands are unremarkable. Kidneys are normal, without renal calculi, focal lesion, or hydronephrosis. The urinary bladder wall appears thickened for the degree of distension. Stomach/Bowel: Stomach is within normal limits. Appendix appears normal. No evidence of bowel wall thickening, distention, or inflammatory changes. Vascular/Lymphatic: Aortic atherosclerosis. A lymph node to the left of the inferior vena cava near the porta hepatis measures 1.2 cm in short axis (series 3, image 24 and may be slightly enlarged compared to 03/03/2020. No enlarged pelvic lymph nodes. Reproductive: Prostate is unremarkable. Other: There is a small amount of peritoneal fluid near the hepatic mass extending along the right pericolic gutter. No abdominal wall hernia or abnormality. Musculoskeletal: Numerous sclerotic lesions throughout the spine and pelvis appear progressed since prior exam. Degenerative changes are seen in the spine. Previously seen metastatic disease involving the ventral epidural space at the level of S1 is not appreciated on today's exam. IMPRESSION: 1. Enhancing mass in the right hepatic lobe is concerning for hepatocellular carcinoma given the apparent washout on delayed phase and reported history of hepatitis C cirrhosis. Metastatic disease from the patient's reported prostate cancer is considered less likely. 2. Urinary bladder wall thickening for the degree of distension may reflect cystitis or chronic bladder outlet  obstruction. 3. Numerous sclerotic lesions throughout the spine and pelvis appear progressed since 03/03/2020, likely representing metastatic disease. Electronically Signed: By: Zerita Boers M.D. On: 12/10/2020 14:53    EKG: SR lateral ST depression no acute ST elevation    ASSESSMENT AND PLAN:   Unstable angina:  clinical story almost classic for unstable angina mild elevation in troponin 46-> 97 Only slight ST depression in lateral leads. Concern using anticoagulation with spinal epidural mets. Discussed with  ER doctor and covering oncology. Plan t repeat MRI scans of thoracic, lumbar and cervical spine and have oncology assess risk of bleeding before proceeding with cath and requisite anticoagulation Start ASA, beta blocker and statin TTE to assess EF Continue to trend troponin and ECG with pain or in am  Cancer:  prostate and ? New diagnosis of hepatocellular cancer. This tempers agressiveness of coronary evaluation as long term prognosis appears poor. Have discussed with covering oncology and Dr Benay Spice to review new MRI studies in am and decide if he is a candidate for anticoagulation  Keep NPO and tentatively put on cath schedule for tomorrow afternoon   Signed: Jenkins Rouge 12/19/2020, 4:16 PM

## 2020-12-19 NOTE — Progress Notes (Signed)
Patient brought to 4E from ED. Telemetry wall monitor applied, CCMD notified. VSS. CHG bath completed. Patient oriented to room and staff. Call bell in reach.  Daymon Larsen, RN

## 2020-12-19 NOTE — ED Notes (Signed)
Pt's trying to take a nap until dinner tray comes.

## 2020-12-19 NOTE — ED Triage Notes (Signed)
Pt from home via [facility]. Pt reports left sided chest pain that is "on and off" for the last 3 months. Pt reports diaphoresis when the chest pain starts.

## 2020-12-19 NOTE — ED Provider Notes (Signed)
Care assumed from [employer] at shift change, please see her note for full details, but in brief Edgar Mooney is a 55 y.o. male with history of metastatic prostate cancer, presents for evaluation of intermittent left-sided chest pain initially occurring only with activity but now also present with rest and occurring more often.  Today had episode of left-sided chest pain with radiation to the right arm, nausea, vomiting and diaphoresis.  Given aspirin with EMS and chest pain relieved with sublingual nitro in the ED.  Initial troponin of 46, increasing to 97.  EKG with some possible mild depressions.  Prior care team attempted to get a hold of cardiology multiple times but did not receive a call back.  Will page cardiology attending directly.  Currently patient is chest pain-free, will need admission for further chest pain evaluation.  Concerning story for unstable angina   BP 120/80   Pulse (!) 54   Temp 98 F (36.7 C) (Oral)   Resp 15   Ht '5\' 2"'$  (1.575 m)   Wt 72.6 kg   SpO2 93%   BMI 29.26 kg/m    ED Course/Procedures   Labs Reviewed  CBC - Abnormal; Notable for the following components:      Result Value   RBC 4.00 (*)    Hemoglobin 12.6 (*)    HCT 38.2 (*)    All other components within normal limits  COMPREHENSIVE METABOLIC PANEL - Abnormal; Notable for the following components:   CO2 20 (*)    Glucose, Bld 106 (*)    Total Protein 6.2 (*)    Albumin 3.3 (*)    All other components within normal limits  TROPONIN I (HIGH SENSITIVITY) - Abnormal; Notable for the following components:   Troponin I (High Sensitivity) 46 (*)    All other components within normal limits  TROPONIN I (HIGH SENSITIVITY) - Abnormal; Notable for the following components:   Troponin I (High Sensitivity) 97 (*)    All other components within normal limits  RAPID URINE DRUG SCREEN, HOSP PERFORMED  LIPID PANEL  HEMOGLOBIN A1C  MAGNESIUM   DG Chest 2 View  Result Date:  12/19/2020 CLINICAL DATA:  Chest pain, diaphoresis EXAM: CHEST - 2 VIEW COMPARISON:  03/03/2020 FINDINGS: Lower lung volumes with similar diffuse bilateral interstitial opacities may represent chronic lung disease. Minor basilar atelectasis. Normal heart size and vascularity. No definite focal pneumonia, collapse or consolidation. Negative for effusion or pneumothorax. Trachea midline. Degenerative changes of the spine. IMPRESSION: Similar chronic interstitial lung changes with lower lung volumes. No definite superimposed acute process. Electronically Signed   By: [name].  Shick M.D.   On: 12/19/2020 12:45    .Critical Care  Date/Time: 12/19/2020 6:14 PM Performed by: Jacqlyn Larsen, PA-C Authorized by: Jacqlyn Larsen, PA-C   Critical care provider statement:    Critical care time (minutes):  45   Critical care was necessary to treat or prevent imminent or life-threatening deterioration of the following conditions:  Cardiac failure (Unstable angina)   Critical care was time spent personally by me on the following activities:  Discussions with consultants, evaluation of patient's response to treatment, examination of patient, ordering and performing treatments and interventions, ordering and review of laboratory studies, ordering and review of radiographic studies, pulse oximetry, re-evaluation of patient's condition, obtaining history from patient or surrogate and review of old charts   I assumed direction of critical care for this patient from another provider in my specialty: yes  Care discussed with: admitting provider    MDM   Case discussed with Dr. Johnsie Cancel with cardiology, Feels patient will likely need chemotherapy given metastatic spinal disease would like oncology consult to discuss the safety of anticoagulation before initiating heparin therapy.  Request medicine admission but will see the patient shortly.  Case discussed with with Dr. Orma Flaming with [facility] hospitalist who will see and  admit patient.  Case discussed with Dr. Chryl Heck with oncology, she reviewed patient's prior imaging, patient did have previous history of an S1 epidural tumor, but on most recent CT this was not noted, cardiology requested that oncology place a consult note with their recommendations regarding anticoagulation, Dr. Chryl Heck called and spoke with cardiologist. Plan for MRI of the cervical, thoracic and lumbar spine to assess for spinal metastasis given prior history of S1 epidural space mass prior to starting anticoagulation.  Once imaging is present oncology recommendations on anticoagulation. Dr. Johnsie Cancel is in agreement with this plan and hospitalist has been updated as well.  Patient aware and does not require medication for claustrophobia for MRI.  Admitting team will follow up on MRI scan.  Patient       Edgar Mooney 12/19/20 1814    Sherwood Gambler, MD 12/20/20 [place]

## 2020-12-19 NOTE — ED Provider Notes (Signed)
[facility] EMERGENCY DEPARTMENT Provider Note   CSN: [id number] Arrival date & time: 12/19/20  1134     History Chief Complaint  Patient presents with   Chest Pain    Edgar Mooney is a 55 y.o. 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  States that initially the pain only occurred when he was active such as mowing his lawn or working around his home, however in the last week it has become intermittent and debilitating even when at rest.  Lasts for approximately 5 minutes at a time and will resolve on its own after resting.  He does endorse radiation down the left arm during these episodes, sensation of shortness of breath, and then 1 episode of NBNB emesis today secondary to nausea due to severity of pain.  He has not seen a cardiologist but does follow with oncology for his prostate cancer.  I have personally reviewed this patient's medical records.  He has history of polysubstance abuse, prostate cancer, ITP, depression.  He is not anticoagulated. HPI  HPI: A 55 year old patient with a history of hypertension and hypercholesterolemia presents for evaluation of chest pain. Initial onset of pain was approximately 1-3 hours ago. The patient's chest pain is described as heaviness/pressure/tightness and is worse with exertion. The patient complains of nausea and reports some diaphoresis. The patient's chest pain is middle- or left-sided, is not well-localized, is not sharp and does radiate to the arms/jaw/neck. The patient has a family history of coronary artery disease in a first-degree relative with onset less than age 36. The patient has no history of stroke, has no history of peripheral artery disease, has not smoked in the past 90 days, denies any history of treated diabetes and does not have an elevated BMI  (>=30).   Past Medical History:  Diagnosis Date   Alcohol abuse    Cocaine abuse ([place])    Depression    Gout    Hep C w/o coma, chronic ([place]) diagnosed May 2016    Patient Active Problem List   Diagnosis Date Noted   Prostate cancer ([place]) 03/07/2020   Anemia 03/03/2020   Hypertriglyceridemia 02/27/2018   Cocaine abuse with cocaine-induced mood disorder ([place]) 04/03/2016   Depression    Idiopathic thrombocytopenic purpura ([place]) 01/14/2016   Polysubstance abuse ([place]) 01/14/2016   Benzodiazepine abuse ([place]) 01/05/2016   Major depressive disorder, recurrent episode, severe, 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 No pertinent surgical history.     Family History  Problem Relation Age of Onset   Cancer Mother    Cancer Father     Social History   Tobacco Use   Smoking status: Every Day    Packs/day: 1.00    Years: 25.00    Pack years: 25.00    Types: Cigarettes   Smokeless tobacco: Never  Substance Use Topics   Alcohol use: Not Currently    Alcohol/week: 126.0 standard drinks    Types: 126 Cans of beer per week    Comment: 18 pack per day   Drug use: Yes    Types: Cocaine, IV, Heroin, Marijuana    Comment: Used crack  and cocaine 04/02/16    Home Medications Prior to Admission medications   Medication Sig Start Date End Date Taking? Authorizing Provider  abiraterone acetate (ZYTIGA) 250 MG tablet TAKE 4 TABLETS (1,000 MG TOTAL) BY MOUTH DAILY. TAKE ON AN EMPTY STOMACH 1 HOUR BEFORE OR 2 HOURS AFTER A MEAL 11/23/20 11/23/21  Ladell Pier, MD  allopurinol (ZYLOPRIM) 100 MG tablet TAKE 1 TABLET BY MOUTH EVERY DAY START ON 12/24 09/05/20   Ladell Pier, MD  ferrous gluconate (FERGON) 324 MG tablet Take 1 tablet (324 mg total) by mouth 2 (two) times daily with a meal. Patient  not taking: No sig reported 03/07/20   Guilford Shi, MD  indomethacin (INDOCIN) 50 MG capsule Take 1 capsule (50 mg total) by mouth 3 (three) times daily with meals. For gout flare Patient not taking: No sig reported 04/01/20   Owens Shark, NP  ondansetron (ZOFRAN) 4 MG tablet Take 1 tablet (4 mg total) by mouth every 6 (six) hours as needed for nausea. Patient not taking: No sig reported 04/01/20   Ladell Pier, MD  oxyCODONE-acetaminophen (PERCOCET/ROXICET) 5-325 MG tablet Take 1 tablet by mouth every 4 (four) hours as needed for severe pain. 12/14/20   Ladell Pier, MD  predniSONE (DELTASONE) 5 MG tablet TAKE 1 TABLET (5 MG TOTAL) BY MOUTH DAILY WITH BREAKFAST. 10/25/20 10/25/21  Ladell Pier, MD  tamsulosin (FLOMAX) 0.4 MG CAPS capsule TAKE 1 CAPSULE BY MOUTH EVERY DAY AFTER SUPPER 09/05/20   Ladell Pier, MD    Allergies    Patient has no known allergies.  Review of Systems   Review of Systems  Constitutional:  Positive for activity change and fatigue. Negative for fever.  HENT: Negative.    Eyes: Negative.   Respiratory:  Positive for chest tightness and shortness of breath.   Cardiovascular:  Positive for chest pain. Negative for palpitations and leg swelling.  Gastrointestinal:  Positive for nausea and vomiting. Negative for abdominal distention, abdominal pain and diarrhea.  Genitourinary: Negative.   Musculoskeletal: Negative.   Neurological:  Positive for weakness and light-headedness. Negative for syncope.   Physical Exam Updated Vital Signs BP 120/80   Pulse (!) 54   Temp 98 F (36.7 C) (Oral)   Resp 15   Ht '5\' 2"'$  (1.575 m)   Wt 72.6 kg   SpO2 93%   BMI 29.26 kg/m   Physical Exam Vitals and nursing note reviewed.  Constitutional:      Appearance: He is obese. He is not toxic-appearing.  HENT:     Head: Normocephalic and atraumatic.     Nose: Nose normal.     Mouth/Throat:     Mouth: Mucous membranes are moist.     Pharynx: Oropharynx is  clear. Uvula midline. No oropharyngeal exudate or posterior oropharyngeal erythema.     Tonsils: No tonsillar exudate.  Eyes:     General: Lids are normal. Vision grossly intact.        Right eye: No discharge.        Left eye: No discharge.     Extraocular Movements: Extraocular movements intact.     Conjunctiva/sclera: Conjunctivae normal.     Pupils: Pupils are equal, round, and reactive to light.  Neck:     Trachea: Trachea and phonation normal.  Cardiovascular:     Rate and Rhythm: Normal rate and regular rhythm.     Pulses: Normal pulses.     Heart sounds: Normal heart sounds. No murmur  heard. Pulmonary:     Effort: Pulmonary effort is normal. No tachypnea, bradypnea, accessory muscle usage, prolonged expiration or respiratory distress.     Breath sounds: Normal breath sounds. No wheezing or rales.  Chest:     Chest wall: No mass, lacerations, deformity, swelling, tenderness, crepitus or edema.  Abdominal:     General: Bowel sounds are normal. There is no distension.     Palpations: Abdomen is soft.     Tenderness: There is no abdominal tenderness. There is no right CVA tenderness, left CVA tenderness, guarding or rebound.  Musculoskeletal:        General: No deformity.     Cervical back: Normal range of motion and neck supple. No edema, rigidity or crepitus. No pain with movement, spinous process tenderness or muscular tenderness.     Right lower leg: No tenderness. No edema.     Left lower leg: No tenderness. No edema.  Lymphadenopathy:     Cervical: No cervical adenopathy.  Skin:    General: Skin is warm and dry.     Capillary Refill: Capillary refill takes less than 2 seconds.     Findings: No rash.  Neurological:     General: No focal deficit present.     Mental Status: He is alert and oriented to person, place, and time. Mental status is at baseline.     GCS: GCS eye subscore is 4. GCS verbal subscore is 5. GCS motor subscore is 6.  Psychiatric:        Mood and  Affect: Mood normal.    ED Results / Procedures / Treatments   Labs (all labs ordered are listed, but only abnormal results are displayed) Labs Reviewed  CBC - Abnormal; Notable for the following components:      Result Value   RBC 4.00 (*)    Hemoglobin 12.6 (*)    HCT 38.2 (*)    All other components within normal limits  COMPREHENSIVE METABOLIC PANEL - Abnormal; Notable for the following components:   CO2 20 (*)    Glucose, Bld 106 (*)    Total Protein 6.2 (*)    Albumin 3.3 (*)    All other components within normal limits  TROPONIN I (HIGH SENSITIVITY) - Abnormal; Notable for the following components:   Troponin I (High Sensitivity) 46 (*)    All other components within normal limits  TROPONIN I (HIGH SENSITIVITY) - Abnormal; Notable for the following components:   Troponin I (High Sensitivity) 97 (*)    All other components within normal limits    EKG EKG Interpretation  Date/Time:  Monday December 19 2020 11:48:30 EDT Ventricular Rate:  58 PR Interval:  136 QRS Duration: 96 QT Interval:  435 QTC Calculation: 428 R Axis:   4 Text Interpretation: Sinus rhythm Probable left atrial enlargement RSR' in V1 or V2, right VCD or RVH ST depr, consider ischemia, anterolateral lds Baseline artifact obscures interpretation. No obvious ischemia Confirmed by Lorre Munroe ([id number]) on 12/19/2020 1:40:16 PM  Radiology DG Chest 2 View  Result Date: 12/19/2020 CLINICAL DATA:  Chest pain, diaphoresis EXAM: CHEST - 2 VIEW COMPARISON:  03/03/2020 FINDINGS: Lower lung volumes with similar diffuse bilateral interstitial opacities may represent chronic lung disease. Minor basilar atelectasis. Normal heart size and vascularity. No definite focal pneumonia, collapse or consolidation. Negative for effusion or pneumothorax. Trachea midline. Degenerative changes of the spine. IMPRESSION: Similar chronic interstitial lung changes with lower lung volumes. No definite superimposed acute process.  Electronically Signed  By: Eugenie Filler M.D.   On: 12/19/2020 12:45    Procedures Procedures   Medications Ordered in ED Medications  nitroGLYCERIN (NITROSTAT) SL tablet 0.4 mg (0.4 mg Sublingual Given 12/19/20 1429)    ED Course  I have reviewed the triage vital signs and the nursing notes.  Pertinent labs & imaging results that were available during my care of the patient were reviewed by me and considered in my medical decision making (see chart for details).  Clinical Course as of 12/19/20 1554  Mon Dec 19, 2020  1357 Patient experiencing CP at this time, administered sublingual nitro and ASA.  [RS]  1421 Consult called to cardiology over an hour ago without returned call.  Will page again. [RS]  1425 Aspirin canceled, as EMS administered aspirin en route, per RN.  [RS]  [id number] Patient reevaluated with complete resolution of chest pain after sublingual nitro x2. [RS]  [id number] Have not back from cardiology despite multiple hours and multiple pages.  At this time patient has had recurring chest pain in the emergency department which was relieved by nitroglycerin x2.  For this reason do feel patient would benefit from observation admission, with heart score of 6, may have inpatient cardiology consultation.  Pending admission callback return at this time. [RS]    Clinical Course User Index [RS] Kahmya Pinkham, [name], PA-C   MDM Rules/Calculators/A&P HEAR Score: 35                       55 year old male with history of metastatic prostate cancer and cocaine use who presents with intermittent left-sided chest pain that radiates to the arm with associated nausea and shortness of breath.  Differential diagnosis includes is limited to ACS, unstable angina, PE, pleural effusion, pneumonia, sepsis, dissection.  Hypertensive on intake, vitals otherwise normal.  Cardiopulmonary exam is normal, abdominal exam is benign.  Patient is neurovascularly intact in all 4 extremities.  CBC with anemia with  hemoglobin of 12.6, near patient's baseline.  CMP Unremarkable.  Troponin elevated to 46, baseline around 19.  Chest x-ray negative for acute cardiopulmonary disease.  EKG with sinus rhythm some ST depressions in the anterolateral leads but no STEMI.Consult called to cardiology, pending return call at this time.  Luvenia Heller voiced understanding of his medical evaluation and treatment plan thus far.  Each of his questions was answered to his expressed satisfaction.  He is amenable plan for admission at this time.  Care of patient signed out to oncoming ED provider, C4, PA-C, time of shift change, pending repeat troponin, cardiology consult call, and consult call from admitting provider.  All pertinent HPI, physical exam, and laboratory findings were discussed with her prior to my departure.  This chart was dictated using voice recognition software, Dragon. Despite the best efforts of this provider to proofread and correct errors, errors may still occur which can change documentation meaning.  Final Clinical Impression(s) / ED Diagnoses Final diagnoses:  None    Rx / DC Orders ED Discharge Orders     None        Aura Dials 12/19/20 1554    Arnaldo Natal, MD 12/20/20 [phone]

## 2020-12-19 NOTE — ED Notes (Signed)
They didn't bring pt a meal tray so we ordered on to be brought up to him.

## 2020-12-19 NOTE — ED Notes (Signed)
Date and time results received: 12/19/20 1548 (use smartphrase ".now" to insert current time)  Test: Trop Critical Value: 70  Name of Provider Notified: Merleen Nicely, [place]  Orders Received? Or Actions Taken?: see chart

## 2020-12-19 NOTE — H&P (Addendum)
History and Physical    Edgar Mooney [id number] DOB: September 28, 1965 DOA: 12/19/2020  PCP: Patient, No Pcp Per (Inactive) Consultants:  oncology: Dr. Benay Spice,  Patient coming from:  Home - lives with his children's mother   Chief Complaint: chest pain  HPI: Edgar Mooney is a 55 y.o. male with medical history significant of CAD, history of alcohol abuse, chronic hep C,, polysubstance abuse and no cocaine abuse, anemia and metastatic prostate cancer presenting with chest pain. He states his chest starting hurting around 11AM this morning. Pain substernal and left anterior chest wall that radiated into his left arm. Rated 10/10. He states it was sharp in nature and lasted about 15 minutes. He had sweating, shortness of breath and vomiting associated with this episode.  He sat down and it went away.  He states while resting and when he hasn't been doing anything he will have it, but more intense with activity. He states he has been having chest pain for 2-3 months with exertion.   Risk factors: smokes 1PPD, cocaine "once in a blue moon", HTN on no medication. No hx of heart disease in his family.    ED Course: vitals: Afebrile, blood pressure 150/78, heart rate 58, respiratory rate 19, oxygen 97% on room air Pertinent labs: Troponin 46--->97, hemoglobin 12.6, chest x-ray no acute process Given nitroglycerin sublingually in ER cardiology consulted and [facility] was asked to admit.  Review of Systems: As per HPI; otherwise review of systems reviewed and negative.   Ambulatory Status:  Ambulates without assistance   Past Medical History:  Diagnosis Date   Alcohol abuse    Cocaine abuse ([place])    Depression    Gout    Hep C w/o coma, chronic ([place]) diagnosed May 2016    History reviewed. No pertinent surgical history.  Social History   Socioeconomic History   Marital status: Single    Spouse name: Not on file   Number of children: Not on file   Years of education: Not on file   Highest  education level: Not on file  Occupational History   Not on file  Tobacco Use   Smoking status: Every Day    Packs/day: 1.00    Years: 25.00    Pack years: 25.00    Types: Cigarettes   Smokeless tobacco: Never  Substance and Sexual Activity   Alcohol use: Not Currently    Alcohol/week: 126.0 standard drinks    Types: 126 Cans of beer per week    Comment: 18 pack per day   Drug use: Yes    Types: Cocaine, IV, Heroin, Marijuana    Comment: Used crack and cocaine 04/02/16   Sexual activity: Not Currently  Other Topics Concern   Not on file  Social History Narrative   Not on file   Social Determinants of Health   Financial Resource Strain: High Risk   Difficulty of Paying Living Expenses: Hard  Food Insecurity: Food Insecurity Present   Worried About Running Out of Food in the Last Year: Sometimes true   Ran Out of Food in the Last Year: Never true  Transportation Needs: No Transportation Needs   Lack of Transportation (Medical): No   Lack of Transportation (Non-Medical): No  Physical Activity: Not on file  Stress: Not on file  Social Connections: Moderately Isolated   Frequency of Communication with Friends and Family: Once a week   Frequency of Social Gatherings with Friends and Family: Once a week   Attends Religious Services:  1 to 4 times per year   Active Member of Clubs or Organizations: No   Attends Archivist Meetings: 1 to 4 times per year   Marital Status: Separated  Intimate Partner Violence: Not At Risk   Fear of Current or Ex-Partner: No   Emotionally Abused: No   Physically Abused: No   Sexually Abused: No    No Known Allergies  Family History  Problem Relation Age of Onset   Cancer Mother    Cancer Father     Prior to Admission medications   Medication Sig Start Date End Date Taking? Authorizing Provider  abiraterone acetate (ZYTIGA) 250 MG tablet TAKE 4 TABLETS (1,000 MG TOTAL) BY MOUTH DAILY. TAKE ON AN EMPTY STOMACH 1 HOUR BEFORE OR  2 HOURS AFTER A MEAL 11/23/20 11/23/21  Ladell Pier, MD  allopurinol (ZYLOPRIM) 100 MG tablet TAKE 1 TABLET BY MOUTH EVERY DAY START ON 12/24 09/05/20   Ladell Pier, MD  ferrous gluconate (FERGON) 324 MG tablet Take 1 tablet (324 mg total) by mouth 2 (two) times daily with a meal. Patient not taking: No sig reported 03/07/20   Guilford Shi, MD  indomethacin (INDOCIN) 50 MG capsule Take 1 capsule (50 mg total) by mouth 3 (three) times daily with meals. For gout flare Patient not taking: No sig reported 04/01/20   Owens Shark, NP  ondansetron (ZOFRAN) 4 MG tablet Take 1 tablet (4 mg total) by mouth every 6 (six) hours as needed for nausea. Patient not taking: No sig reported 04/01/20   Ladell Pier, MD  oxyCODONE-acetaminophen (PERCOCET/ROXICET) 5-325 MG tablet Take 1 tablet by mouth every 4 (four) hours as needed for severe pain. 12/14/20   Ladell Pier, MD  predniSONE (DELTASONE) 5 MG tablet TAKE 1 TABLET (5 MG TOTAL) BY MOUTH DAILY WITH BREAKFAST. 10/25/20 10/25/21  Ladell Pier, MD  tamsulosin (FLOMAX) 0.4 MG CAPS capsule TAKE 1 CAPSULE BY MOUTH EVERY DAY AFTER SUPPER 09/05/20   Ladell Pier, MD    Physical Exam: Vitals:   12/19/20 1215 12/19/20 1423 12/19/20 1429 12/19/20 1445  BP: (!) 177/89 (!) 150/94 (!) 160/79 120/80  Pulse: 62 (!) 57 (!) 57 (!) 54  Resp: '18 16 18 15  '$ Temp:      TempSrc:      SpO2: 97% 96% 94% 93%  Weight:      Height:         General:  Appears calm and comfortable and is in NAD Eyes:  PERRL, EOMI, normal lids, iris ENT:  grossly normal hearing, lips & tongue, mmm; no teeth  Neck:  no LAD, masses or thyromegaly; no carotid bruits Cardiovascular:  RRR, no m/r/g. No LE edema.  Respiratory:   CTA bilaterally with no wheezes/rales/rhonchi.  Normal respiratory effort. Abdomen:  soft, protruberant, ND, NABS Back:   normal alignment, no CVAT Skin:  no rash or induration seen on limited exam Musculoskeletal:  grossly normal tone BUE/BLE,  good ROM, no bony abnormality Lower extremity:  No LE edema.  Limited foot exam with no ulcerations.  2+ distal pulses. Psychiatric:  grossly normal mood and affect, speech fluent and appropriate, AOx3 Neurologic:  CN 2-12 grossly intact, moves all extremities in coordinated fashion, sensation intact    Radiological Exams on Admission: Independently reviewed - see discussion in A/P where applicable  DG Chest 2 View  Result Date: 12/19/2020 CLINICAL DATA:  Chest pain, diaphoresis EXAM: CHEST - 2 VIEW COMPARISON:  03/03/2020 FINDINGS: Lower lung  volumes with similar diffuse bilateral interstitial opacities may represent chronic lung disease. Minor basilar atelectasis. Normal heart size and vascularity. No definite focal pneumonia, collapse or consolidation. Negative for effusion or pneumothorax. Trachea midline. Degenerative changes of the spine. IMPRESSION: Similar chronic interstitial lung changes with lower lung volumes. No definite superimposed acute process. Electronically Signed   By: [name].  Shick M.D.   On: 12/19/2020 12:45    EKG: Independently reviewed.  NSR with rate 56; nonspecific ST changes with no evidence of acute ischemia   Labs on Admission: I have personally reviewed the available labs and imaging studies at the time of the admission.  Pertinent labs:  Troponin 46--->97,  hemoglobin 12.6,  chest x-ray no acute process    Assessment/Plan Principal Problem:   Unstable angina [facility]) -Cardiology consulted and appreciate assistance. -cardiology recommended consulting oncology before giving heparin with concerns for most recent CT abdomen pelvis shows osseous mets, S1 level epidural space mass which was not appreciated on this exam.  oncology recommended repeat imaging before further recommendations MRIs have been ordered but not yet done -echo ordered  -concern for cocaine induced CP. Hold beta blocker until UDS results.  -will place on telemetry,  follow cardiology  recommendations -maximize medical therapy -monitor electrolytes, mag pending.  -lipid panel/a1c pending  -UDS pending with past history of cocaine abuse  -ASA started  -SCDs until get MRI imaging back  -cardiology recommended NPO   Active Problems:   HTN (hypertension) -Elevated today.  Appears to not be on any antihypertensive medication.   Cocaine abuse with cocaine-induced mood disorder (HCC) -uds pending. States no recent use.    metastatic Prostate cancer [facility]) followed by dr. Benay Spice  Recent CT on 8/27 revealed a right liver mass. -Continue Zytiga/prednisone tomorrow unless contraindicated with cath -Was prescribed pain medication outpatient but on [facility] states he is not taking, will continue the oxycodone for pain  Liver lesion Had a liver MRI tomorrow ordered by Dr. Benay Spice He suspects the mass is related to hepatocellular carcinoma as this would be very unlikely presentation for metastatic prostate cancer    History of ETOH abuse -doesn't drink much anymore, every other weekend if that.     Gout Allopurinol, states not taking.     Hep C w/o coma, chronic ([place]) -treated a couple years ago, states he is no longer followed for this.     Anemia  -At baseline, likely secondary to metastatic prostate cancer involving the bones    Body mass index is 29.26 kg/m.    Level of care: Telemetry Cardiac DVT prophylaxis:  SCDs Code Status:  Full - confirmed with patient  Family Communication: None present; I spoke with the patient's significant other by telephone at the time of admission: Neita Garnet: [id number] Disposition Plan:  The patient is from: home  Anticipated d/c is to: home   Requires inpatient hospitalization and is at significant risk of worsening, requires constant monitoring, assessment and MDM with specialists.    Patient is currently: acutely ill Consults called: cardiology: Dr. Johnsie Cancel and heme-onc: EDP   Admission status:  observation   Dragon  dictation used in completing this note.   Orma Flaming MD [facility]   How to contact the [facility] Attending or Consulting provider [place] or covering provider during after hours [place], for this patient?  Check the care team in [facility] and look for a) attending/consulting [facility] provider listed and b) the [facility] team listed Log into [email or web address] and use [place]'s universal  password to access. If you do not have the password, please contact the hospital operator. Locate the [facility] provider you are looking for under [facility] and page to a number that you can be directly reached. If you still have difficulty reaching the provider, please page the [facility] (Director on Call) for the Hospitalists listed on amion for assistance.   12/19/2020, 4:12 PM

## 2020-12-20 ENCOUNTER — Encounter (HOSPITAL_COMMUNITY)
Admission: EM | Disposition: A | Discharge status: Home / Self Care | Payer: Self-pay | Source: Home / Self Care | Attending: Internal Medicine

## 2020-12-20 ENCOUNTER — Ambulatory Visit (HOSPITAL_COMMUNITY): Admission: RE | Admit: 2020-12-20 | Payer: BC Managed Care – PPO | Source: Ambulatory Visit

## 2020-12-20 DIAGNOSIS — F1414 Cocaine abuse with cocaine-induced mood disorder: Secondary | ICD-10-CM | POA: Diagnosis present

## 2020-12-20 DIAGNOSIS — Z8546 Personal history of malignant neoplasm of prostate: Secondary | ICD-10-CM | POA: Diagnosis not present

## 2020-12-20 DIAGNOSIS — I200 Unstable angina: Secondary | ICD-10-CM

## 2020-12-20 DIAGNOSIS — I10 Essential (primary) hypertension: Secondary | ICD-10-CM | POA: Diagnosis present

## 2020-12-20 DIAGNOSIS — Z599 Problem related to housing and economic circumstances, unspecified: Secondary | ICD-10-CM | POA: Diagnosis not present

## 2020-12-20 DIAGNOSIS — C7951 Secondary malignant neoplasm of bone: Secondary | ICD-10-CM | POA: Diagnosis present

## 2020-12-20 DIAGNOSIS — B182 Chronic viral hepatitis C: Secondary | ICD-10-CM | POA: Diagnosis present

## 2020-12-20 DIAGNOSIS — K7460 Unspecified cirrhosis of liver: Secondary | ICD-10-CM | POA: Diagnosis present

## 2020-12-20 DIAGNOSIS — R0789 Other chest pain: Secondary | ICD-10-CM | POA: Diagnosis not present

## 2020-12-20 DIAGNOSIS — D696 Thrombocytopenia, unspecified: Secondary | ICD-10-CM | POA: Diagnosis present

## 2020-12-20 DIAGNOSIS — K769 Liver disease, unspecified: Secondary | ICD-10-CM | POA: Diagnosis present

## 2020-12-20 DIAGNOSIS — T405X1A Poisoning by cocaine, accidental (unintentional), initial encounter: Secondary | ICD-10-CM | POA: Diagnosis present

## 2020-12-20 DIAGNOSIS — F17210 Nicotine dependence, cigarettes, uncomplicated: Secondary | ICD-10-CM | POA: Diagnosis present

## 2020-12-20 DIAGNOSIS — Z20822 Contact with and (suspected) exposure to covid-19: Secondary | ICD-10-CM | POA: Diagnosis present

## 2020-12-20 DIAGNOSIS — F1419 Cocaine abuse with unspecified cocaine-induced disorder: Secondary | ICD-10-CM | POA: Diagnosis present

## 2020-12-20 DIAGNOSIS — I25110 Atherosclerotic heart disease of native coronary artery with unstable angina pectoris: Secondary | ICD-10-CM | POA: Diagnosis present

## 2020-12-20 DIAGNOSIS — F1010 Alcohol abuse, uncomplicated: Secondary | ICD-10-CM | POA: Diagnosis present

## 2020-12-20 DIAGNOSIS — M109 Gout, unspecified: Secondary | ICD-10-CM | POA: Diagnosis present

## 2020-12-20 DIAGNOSIS — F32A Depression, unspecified: Secondary | ICD-10-CM | POA: Diagnosis present

## 2020-12-20 DIAGNOSIS — C7949 Secondary malignant neoplasm of other parts of nervous system: Secondary | ICD-10-CM | POA: Diagnosis present

## 2020-12-20 DIAGNOSIS — Z809 Family history of malignant neoplasm, unspecified: Secondary | ICD-10-CM | POA: Diagnosis not present

## 2020-12-20 DIAGNOSIS — M47812 Spondylosis without myelopathy or radiculopathy, cervical region: Secondary | ICD-10-CM | POA: Diagnosis not present

## 2020-12-20 DIAGNOSIS — C61 Malignant neoplasm of prostate: Secondary | ICD-10-CM | POA: Diagnosis present

## 2020-12-20 DIAGNOSIS — M50221 Other cervical disc displacement at C4-C5 level: Secondary | ICD-10-CM | POA: Diagnosis not present

## 2020-12-20 DIAGNOSIS — I214 Non-ST elevation (NSTEMI) myocardial infarction: Secondary | ICD-10-CM | POA: Diagnosis present

## 2020-12-20 DIAGNOSIS — M545 Low back pain, unspecified: Secondary | ICD-10-CM | POA: Diagnosis not present

## 2020-12-20 DIAGNOSIS — D630 Anemia in neoplastic disease: Secondary | ICD-10-CM | POA: Diagnosis present

## 2020-12-20 DIAGNOSIS — F1011 Alcohol abuse, in remission: Secondary | ICD-10-CM | POA: Diagnosis not present

## 2020-12-20 DIAGNOSIS — Z5948 Other specified lack of adequate food: Secondary | ICD-10-CM | POA: Diagnosis not present

## 2020-12-20 DIAGNOSIS — Z79899 Other long term (current) drug therapy: Secondary | ICD-10-CM | POA: Diagnosis not present

## 2020-12-20 LAB — RAPID URINE DRUG SCREEN, HOSP PERFORMED
Amphetamines: NOT DETECTED
Barbiturates: NOT DETECTED
Benzodiazepines: NOT DETECTED
Cocaine: POSITIVE — AB
Opiates: NOT DETECTED
Tetrahydrocannabinol: NOT DETECTED

## 2020-12-20 LAB — HEPARIN LEVEL (UNFRACTIONATED): Heparin Unfractionated: <0.1 IU/mL — ABNORMAL LOW (ref 0.30–0.70)

## 2020-12-20 SURGERY — LEFT HEART CATH AND CORONARY ANGIOGRAPHY
Anesthesia: LOCAL

## 2020-12-20 MED ORDER — ISOSORBIDE MONONITRATE ER 60 MG PO TB24
60.0000 mg | ORAL_TABLET | Freq: Every day | ORAL | Status: DC
  Administered 2020-12-20 – 2020-12-22 (×3): 60 mg via ORAL
  Filled 2020-12-20 (×3): qty 1

## 2020-12-20 MED ORDER — GADOBUTROL 1 MMOL/ML IV SOLN
7.0000 mL | Freq: Once | INTRAVENOUS | Status: AC | PRN
  Administered 2020-12-20: 7 mL via INTRAVENOUS

## 2020-12-20 MED ORDER — HEPARIN (PORCINE) 25000 UT/250ML-% IV SOLN
1300.0000 [IU]/h | INTRAVENOUS | Status: DC
  Administered 2020-12-20: 850 [IU]/h via INTRAVENOUS
  Filled 2020-12-20: qty 250

## 2020-12-20 NOTE — Progress Notes (Addendum)
Progress Note  Patient Name: Edgar Mooney Date of Encounter: 12/20/2020  Primary Cardiologist: None  Subjective   Feeling hungry. He is upset about whether cath will get done or not. No CP overnight. Last episode of CP was yesterday.  Inpatient Medications    Scheduled Meds:  abiraterone acetate  1,000 mg Oral Daily   aspirin EC  81 mg Oral Daily   predniSONE  5 mg Oral Q breakfast   sodium chloride flush  3 mL Intravenous Q12H   sodium chloride flush  3 mL Intravenous Q12H   tamsulosin  0.4 mg Oral Daily   Continuous Infusions:  sodium chloride     sodium chloride     sodium chloride 1 mL/kg/hr (12/20/20 0543)   PRN Meds: sodium chloride, sodium chloride, acetaminophen, nitroGLYCERIN, ondansetron (ZOFRAN) IV, oxyCODONE-acetaminophen, sodium chloride flush, sodium chloride flush   Vital Signs    Vitals:   12/19/20 1830 12/19/20 1845 12/19/20 1910 12/20/20 0725  BP:  (!) 167/77 (!) 157/81 138/88  Pulse: 68 65 63 (!) 57  Resp: $Remo'17 16 20 18  'EqRGX$ Temp:  98.2 F (36.8 C) 98.9 F (37.2 C) 98.4 F (36.9 C)  TempSrc:  Oral Oral Oral  SpO2: 94% 94% 99% 98%  Weight:   74.8 kg   Height:   '5\' 2"'$  (1.575 m)     Intake/Output Summary (Last 24 hours) at 12/20/2020 1127 Last data filed at 12/20/2020 0647 Gross per 24 hour  Intake 295.24 ml  Output --  Net 295.24 ml   Last 3 Weights 12/19/2020 12/19/2020 12/14/2020  Weight (lbs) 164 lb 14.4 oz 160 lb 160 lb 9.6 oz  Weight (kg) 74.798 kg 72.576 kg 72.848 kg  Some encounter information is confidential and restricted. Go to Review Flowsheets activity to see all data.     Telemetry    Generally SB 50s-60s- Personally Reviewed  ECG    SB 53bpm, diffuse STTW changes - biphasic appearance II, avF, with TWI V2-V6 - evolving from prior (has had some degree of varying STTW changes over the years but this is different than previous tracings) - LVH noted - Personally Reviewed  Physical Exam   GEN: No acute distress.  HEENT:  Normocephalic, atraumatic, sclera non-icteric. Neck: No JVD or bruits. Cardiac: RRR no murmurs, rubs, or gallops.  Respiratory: Clear to auscultation bilaterally. Breathing is unlabored. GI: Soft, nontender, non-distended, BS +x 4. MS: no deformity. Extremities: No clubbing or cyanosis. No edema. Distal pedal pulses are 2+ and equal bilaterally. Neuro:  AAOx3. Follows commands. Psych:  Responds to questions appropriately with a normal affect.  Labs    High Sensitivity Troponin:   Recent Labs  Lab 12/19/20 1158 12/19/20 1428  TROPONINIHS 46* 97*      Cardiac EnzymesNo results for input(s): TROPONINI in the last 168 hours. No results for input(s): TROPIPOC in the last 168 hours.   Chemistry Recent Labs  Lab 12/19/20 1158  NA 138  K 3.8  CL 111  CO2 20*  GLUCOSE 106*  BUN 14  CREATININE 0.71  CALCIUM 9.2  PROT 6.2*  ALBUMIN 3.3*  AST 34  ALT 38  ALKPHOS 62  BILITOT 0.4  GFRNONAA >60  ANIONGAP 7     Hematology Recent Labs  Lab 12/19/20 1158  WBC 6.9  RBC 4.00*  HGB 12.6*  HCT 38.2*  MCV 95.5  MCH 31.5  MCHC 33.0  RDW 12.5  PLT 175    BNPNo results for input(s): BNP, PROBNP in the last  168 hours.   DDimer No results for input(s): DDIMER in the last 168 hours.   Radiology    DG Chest 2 View  Result Date: 12/19/2020 CLINICAL DATA:  Chest pain, diaphoresis EXAM: CHEST - 2 VIEW COMPARISON:  03/03/2020 FINDINGS: Lower lung volumes with similar diffuse bilateral interstitial opacities may represent chronic lung disease. Minor basilar atelectasis. Normal heart size and vascularity. No definite focal pneumonia, collapse or consolidation. Negative for effusion or pneumothorax. Trachea midline. Degenerative changes of the spine. IMPRESSION: Similar chronic interstitial lung changes with lower lung volumes. No definite superimposed acute process. Electronically Signed   By: [name].  Shick M.D.   On: 12/19/2020 12:45   MR Cervical Spine W or Wo Contrast  Result Date:  12/20/2020 CLINICAL DATA:  Initial evaluation for intermittent left-sided chest pain, history of metastatic prostate cancer. EXAM: MRI CERVICAL, THORACIC AND LUMBAR SPINE WITHOUT AND WITH CONTRAST TECHNIQUE: Multiplanar and multiecho pulse sequences of the cervical spine, to include the craniocervical junction and cervicothoracic junction, and thoracic and lumbar spine, were obtained without and with intravenous contrast. CONTRAST:  47mL GADAVIST GADOBUTROL 1 MMOL/ML IV SOLN COMPARISON:  Prior MRIs from 03/04/2020. FINDINGS: MRI CERVICAL SPINE FINDINGS Alignment: Mild straightening of the normal cervical lordosis. No listhesis. Vertebrae: Diffuse infiltrative low marrow signal intensity again seen throughout the visualized skull base and spine. Previously seen superimposed enhancing lesions are markedly improved as compared to previous exam, compatible with interval response to therapy. Appearance is most evident at the posterior arch of C2 where only a small focus of enhancement is now seen on today's exam, previously fairly prominent on prior MRI. No definite new or progressive lesions. Vertebral body height maintained without pathologic or interval fracture. Previously question extra osseous tumor on the right at C5-6 is not convincingly seen on today's exam. No other new extra osseous extension. No epidural or intracanalicular involvement identified. Cord: Normal signal and morphology. No abnormal enhancement or intracanalicular involvement of tumor. Posterior Fossa, vertebral arteries, paraspinal tissues: Empty sella partially visualized. Visualized portions of the brain and posterior fossa otherwise unremarkable. Paraspinous and prevertebral soft tissues within normal limits. Normal flow voids seen within the vertebral arteries bilaterally. Disc levels: C2-C3: Negative interspace. Mild left-sided facet hypertrophy. No stenosis. C3-C4: Broad posterior disc osteophyte flattens the ventral thecal sac with resultant  mild-to-moderate spinal stenosis. Superimposed uncovertebral and facet hypertrophy with resultant moderate left with mild right C4 foraminal stenosis. C4-C5: Mild disc bulge with uncovertebral spurring. Bilateral facet hypertrophy. No significant spinal stenosis. Moderate right with mild left C5 foraminal narrowing. C5-C6: Degenerative intervertebral disc space narrowing with diffuse disc osteophyte complex. Flattening and partial effacement of the ventral thecal sac, eccentric to the right. Mild spinal stenosis. Severe right worse than left C6 foraminal narrowing. C6-C7: Disc bulge with uncovertebral spurring. Superimposed left paracentral disc protrusion indents the left ventral thecal sac. Mild spinal stenosis. Mild to moderate left with mild right C7 foraminal narrowing. C7-T1:  Normal interspace.  No canal or foraminal stenosis. MRI THORACIC SPINE FINDINGS Alignment: Physiologic with preservation of the normal thoracic kyphosis. No listhesis. Vertebrae: Diffusely abnormal appearance of the visualized osseous structures, consistent with widespread osseous metastatic disease. Overall appearance is markedly improved as compared to previous exam, with decreased heterogeneous enhancement and STIR signal abnormality seen throughout the thoracic spine. Previously seen trace epidural tumor posterior to the T12 vertebral body is no longer visualized. No new extra osseous or epidural tumor identified. Vertebral body height maintained without pathologic or interval fracture. Cord: Normal signal and  morphology. No epidural or intracanalicular tumor. No abnormal enhancement. Paraspinal and other soft tissues: Paraspinous soft tissues within normal limits. Splenomegaly partially visualized. Disc levels: No new significant disc pathology. No significant stenosis or neural impingement. MRI LUMBAR SPINE FINDINGS Segmentation: Standard. Lowest well-formed disc space labeled the L5-S1 level. Alignment: Physiologic with  preservation of the normal lumbar lordosis. No listhesis. Vertebrae: Widespread osseous metastatic disease again seen throughout the visualized lumbar spine and pelvis. Overall, appearance is markedly improved with decreased heterogeneous enhancement seen throughout the affected structures as compared to prior. Vertebral body height maintained without interval or pathologic fracture. Previously seen epidural tumor posterior to the S1 vertebral body has largely resolved, and is no longer seen. Similarly, previously seen extraosseous extension from the bilateral ilium is also no longer clearly visualized. No significant extraosseous extension now seen. No significant epidural or intracanalicular involvement. Conus medullaris and cauda equina: Conus extends to the T12 level. Conus and cauda equina appear normal. Paraspinal and other soft tissues: Interval resolution in previously seen bulky retroperitoneal adenopathy. Disc levels: L1-2:  Unremarkable. L2-3:  Negative interspace.  Mild facet hypertrophy.  No stenosis. L3-4: Disc bulge with mild facet hypertrophy. No spinal stenosis. Mild left L3 foraminal narrowing. L4-5: Degenerative intervertebral disc space narrowing with diffuse disc bulge and reactive endplate spurring. Mild to moderate facet hypertrophy. Resultant mild canal with moderate bilateral lateral recess stenosis. Moderate right with mild left L4 foraminal narrowing. L5-S1: Degenerative intervertebral disc space narrowing with diffuse disc bulge and reactive endplate spurring. Superimposed left subarticular disc protrusion contacts and mildly displaces the descending left S1 nerve root. Mild-to-moderate facet hypertrophy. Resultant moderate left with mild right lateral recess stenosis. Mild bilateral L5 foraminal narrowing. IMPRESSION: MRI CERVICAL SPINE IMPRESSION: 1. Interval improvement in appearance of widespread osseous metastatic disease, consistent with interval response to therapy. No pathologic  fracture. Previously seen extraosseous/epidural tumor at C5-6 is no longer clearly visualized, with no extraosseous or epidural tumor now seen. 2. Underlying multilevel cervical spondylosis with resultant mild-to-moderate spinal stenosis at C3-4 through C6-7 as above. Moderate left C4 and right C5 foraminal stenosis, with severe bilateral C6 foraminal narrowing. MRI THORACIC SPINE IMPRESSION: Marked interval improvement in appearance of widespread osseous metastatic disease, consistent with interval response to therapy. No pathologic fracture. No extra osseous or epidural tumor identified. MRI LUMBAR SPINE IMPRESSION: 1. Interval improvement in widespread osseous metastatic disease involving the lumbar spine and visualized pelvis. No pathologic fracture. 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 No significant epidural or extraosseous disease now seen. 2. Interval improvement/resolution in previously seen retroperitoneal adenopathy. 3. Left subarticular disc protrusion at L5-S1, potentially affecting the left S1 nerve root. Additional more mild degenerative spondylosis elsewhere within the lumbar spine as above. Electronically Signed   By: Jeannine Boga M.D.   On: 12/20/2020 05:37   MR THORACIC SPINE W WO CONTRAST  Result Date: 12/20/2020 CLINICAL DATA:  Initial evaluation for intermittent left-sided chest pain, history of metastatic prostate cancer. EXAM: MRI CERVICAL, THORACIC AND LUMBAR SPINE WITHOUT AND WITH CONTRAST TECHNIQUE: Multiplanar and multiecho pulse sequences of the cervical spine, to include the craniocervical junction and cervicothoracic junction, and thoracic and lumbar spine, were obtained without and with intravenous contrast. CONTRAST:  56mL GADAVIST GADOBUTROL 1 MMOL/ML IV SOLN COMPARISON:  Prior MRIs from 03/04/2020. FINDINGS: MRI CERVICAL SPINE FINDINGS Alignment: Mild straightening  of the normal cervical lordosis. No listhesis. Vertebrae: Diffuse infiltrative low marrow  signal intensity again seen throughout the visualized skull base and spine. Previously seen superimposed enhancing lesions are markedly improved as compared to previous exam, compatible with interval response to therapy. Appearance is most evident at the posterior arch of C2 where only a small focus of enhancement is now seen on today's exam, previously fairly prominent on prior MRI. No definite new or progressive lesions. Vertebral body height maintained without pathologic or interval fracture. Previously question extra osseous tumor on the right at C5-6 is not convincingly seen on today's exam. No other new extra osseous extension. No epidural or intracanalicular involvement identified. Cord: Normal signal and morphology. No abnormal enhancement or intracanalicular involvement of tumor. Posterior Fossa, vertebral arteries, paraspinal tissues: Empty sella partially visualized. Visualized portions of the brain and posterior fossa otherwise unremarkable. Paraspinous and prevertebral soft tissues within normal limits. Normal flow voids seen within the vertebral arteries bilaterally. Disc levels: C2-C3: Negative interspace. Mild left-sided facet hypertrophy. No stenosis. C3-C4: Broad posterior disc osteophyte flattens the ventral thecal sac with resultant mild-to-moderate spinal stenosis. Superimposed uncovertebral and facet hypertrophy with resultant moderate left with mild right C4 foraminal stenosis. C4-C5: Mild disc bulge with uncovertebral spurring. Bilateral facet hypertrophy. No significant spinal stenosis. Moderate right with mild left C5 foraminal narrowing. C5-C6: Degenerative intervertebral disc space narrowing with diffuse disc osteophyte complex. Flattening and partial effacement of the ventral thecal sac, eccentric to the right. Mild spinal stenosis. Severe right worse than left C6 foraminal narrowing. C6-C7: Disc  bulge with uncovertebral spurring. Superimposed left paracentral disc protrusion indents the left ventral thecal sac. Mild spinal stenosis. Mild to moderate left with mild right C7 foraminal narrowing. C7-T1:  Normal interspace.  No canal or foraminal stenosis. MRI THORACIC SPINE FINDINGS Alignment: Physiologic with preservation of the normal thoracic kyphosis. No listhesis. Vertebrae: Diffusely abnormal appearance of the visualized osseous structures, consistent with widespread osseous metastatic disease. Overall appearance is markedly improved as compared to previous exam, with decreased heterogeneous enhancement and STIR signal abnormality seen throughout the thoracic spine. Previously seen trace epidural tumor posterior to the T12 vertebral body is no longer visualized. No new extra osseous or epidural tumor identified. Vertebral body height maintained without pathologic or interval fracture. Cord: Normal signal and morphology. No epidural or intracanalicular tumor. No abnormal enhancement. Paraspinal and other soft tissues: Paraspinous soft tissues within normal limits. Splenomegaly partially visualized. Disc levels: No new significant disc pathology. No significant stenosis or neural impingement. MRI LUMBAR SPINE FINDINGS Segmentation: Standard. Lowest well-formed disc space labeled the L5-S1 level. Alignment: Physiologic with preservation of the normal lumbar lordosis. No listhesis. Vertebrae: Widespread osseous metastatic disease again seen throughout the visualized lumbar spine and pelvis. Overall, appearance is markedly improved with decreased heterogeneous enhancement seen throughout the affected structures as compared to prior. Vertebral body height maintained without interval or pathologic fracture. Previously seen epidural tumor posterior to the S1 vertebral body has largely resolved, and is no longer seen. Similarly, previously seen extraosseous extension from the bilateral ilium is also no longer  clearly visualized. No significant extraosseous extension now seen. No significant epidural or intracanalicular involvement. Conus medullaris and cauda equina: Conus extends to the T12 level. Conus and cauda equina appear normal. Paraspinal and other soft tissues: Interval resolution in previously seen bulky retroperitoneal adenopathy. Disc levels: L1-2:  Unremarkable. L2-3:  Negative interspace.  Mild facet hypertrophy.  No stenosis. L3-4: Disc bulge with mild facet hypertrophy. No spinal stenosis. Mild left L3 foraminal narrowing. L4-5: Degenerative intervertebral disc space narrowing with diffuse disc bulge  and reactive endplate spurring. Mild to moderate facet hypertrophy. Resultant mild canal with moderate bilateral lateral recess stenosis. Moderate right with mild left L4 foraminal narrowing. L5-S1: Degenerative intervertebral disc space narrowing with diffuse disc bulge and reactive endplate spurring. Superimposed left subarticular disc protrusion contacts and mildly displaces the descending left S1 nerve root. Mild-to-moderate facet hypertrophy. Resultant moderate left with mild right lateral recess stenosis. Mild bilateral L5 foraminal narrowing. IMPRESSION: MRI CERVICAL SPINE IMPRESSION: 1. Interval improvement in appearance of widespread osseous metastatic disease, consistent with interval response to therapy. No pathologic fracture. Previously seen extraosseous/epidural tumor at C5-6 is no longer clearly visualized, with no extraosseous or epidural tumor now seen. 2. Underlying multilevel cervical spondylosis with resultant mild-to-moderate spinal stenosis at C3-4 through C6-7 as above. Moderate left C4 and right C5 foraminal stenosis, with severe bilateral C6 foraminal narrowing. MRI THORACIC SPINE IMPRESSION: Marked interval improvement in appearance of widespread osseous metastatic disease, consistent with interval response to therapy. No pathologic fracture. No extra osseous or epidural tumor  identified. MRI LUMBAR SPINE IMPRESSION: 1. Interval improvement in widespread osseous metastatic disease involving the lumbar spine and visualized pelvis. No pathologic fracture. 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 No significant epidural or extraosseous disease now seen. 2. Interval improvement/resolution in previously seen retroperitoneal adenopathy. 3. Left subarticular disc protrusion at L5-S1, potentially affecting the left S1 nerve root. Additional more mild degenerative spondylosis elsewhere within the lumbar spine as above. Electronically Signed   By: Jeannine Boga M.D.   On: 12/20/2020 05:37   MR Lumbar Spine W Wo Contrast  Result Date: 12/20/2020 CLINICAL DATA:  Initial evaluation for intermittent left-sided chest pain, history of metastatic prostate cancer. EXAM: MRI CERVICAL, THORACIC AND LUMBAR SPINE WITHOUT AND WITH CONTRAST TECHNIQUE: Multiplanar and multiecho pulse sequences of the cervical spine, to include the craniocervical junction and cervicothoracic junction, and thoracic and lumbar spine, were obtained without and with intravenous contrast. CONTRAST:  73mL GADAVIST GADOBUTROL 1 MMOL/ML IV SOLN COMPARISON:  Prior MRIs from 03/04/2020. FINDINGS: MRI CERVICAL SPINE FINDINGS Alignment: Mild straightening of the normal cervical lordosis. No listhesis. Vertebrae: Diffuse infiltrative low marrow signal intensity again seen throughout the visualized skull base and spine. Previously seen superimposed enhancing lesions are markedly improved as compared to previous exam, compatible with interval response to therapy. Appearance is most evident at the posterior arch of C2 where only a small focus of enhancement is now seen on today's exam, previously fairly prominent on prior MRI. No definite new or progressive lesions. Vertebral body height maintained without pathologic or interval  fracture. Previously question extra osseous tumor on the right at C5-6 is not convincingly seen on today's exam. No other new extra osseous extension. No epidural or intracanalicular involvement identified. Cord: Normal signal and morphology. No abnormal enhancement or intracanalicular involvement of tumor. Posterior Fossa, vertebral arteries, paraspinal tissues: Empty sella partially visualized. Visualized portions of the brain and posterior fossa otherwise unremarkable. Paraspinous and prevertebral soft tissues within normal limits. Normal flow voids seen within the vertebral arteries bilaterally. Disc levels: C2-C3: Negative interspace. Mild left-sided facet hypertrophy. No stenosis. C3-C4: Broad posterior disc osteophyte flattens the ventral thecal sac with resultant mild-to-moderate spinal stenosis. Superimposed uncovertebral and facet hypertrophy with resultant moderate left with mild right C4 foraminal stenosis. C4-C5: Mild disc bulge with uncovertebral spurring. Bilateral facet hypertrophy. No significant spinal stenosis. Moderate right with mild left C5 foraminal narrowing. C5-C6: Degenerative intervertebral  disc space narrowing with diffuse disc osteophyte complex. Flattening and partial effacement of the ventral thecal sac, eccentric to the right. Mild spinal stenosis. Severe right worse than left C6 foraminal narrowing. C6-C7: Disc bulge with uncovertebral spurring. Superimposed left paracentral disc protrusion indents the left ventral thecal sac. Mild spinal stenosis. Mild to moderate left with mild right C7 foraminal narrowing. C7-T1:  Normal interspace.  No canal or foraminal stenosis. MRI THORACIC SPINE FINDINGS Alignment: Physiologic with preservation of the normal thoracic kyphosis. No listhesis. Vertebrae: Diffusely abnormal appearance of the visualized osseous structures, consistent with widespread osseous metastatic disease. Overall appearance is markedly improved as compared to previous exam,  with decreased heterogeneous enhancement and STIR signal abnormality seen throughout the thoracic spine. Previously seen trace epidural tumor posterior to the T12 vertebral body is no longer visualized. No new extra osseous or epidural tumor identified. Vertebral body height maintained without pathologic or interval fracture. Cord: Normal signal and morphology. No epidural or intracanalicular tumor. No abnormal enhancement. Paraspinal and other soft tissues: Paraspinous soft tissues within normal limits. Splenomegaly partially visualized. Disc levels: No new significant disc pathology. No significant stenosis or neural impingement. MRI LUMBAR SPINE FINDINGS Segmentation: Standard. Lowest well-formed disc space labeled the L5-S1 level. Alignment: Physiologic with preservation of the normal lumbar lordosis. No listhesis. Vertebrae: Widespread osseous metastatic disease again seen throughout the visualized lumbar spine and pelvis. Overall, appearance is markedly improved with decreased heterogeneous enhancement seen throughout the affected structures as compared to prior. Vertebral body height maintained without interval or pathologic fracture. Previously seen epidural tumor posterior to the S1 vertebral body has largely resolved, and is no longer seen. Similarly, previously seen extraosseous extension from the bilateral ilium is also no longer clearly visualized. No significant extraosseous extension now seen. No significant epidural or intracanalicular involvement. Conus medullaris and cauda equina: Conus extends to the T12 level. Conus and cauda equina appear normal. Paraspinal and other soft tissues: Interval resolution in previously seen bulky retroperitoneal adenopathy. Disc levels: L1-2:  Unremarkable. L2-3:  Negative interspace.  Mild facet hypertrophy.  No stenosis. L3-4: Disc bulge with mild facet hypertrophy. No spinal stenosis. Mild left L3 foraminal narrowing. L4-5: Degenerative intervertebral disc space  narrowing with diffuse disc bulge and reactive endplate spurring. Mild to moderate facet hypertrophy. Resultant mild canal with moderate bilateral lateral recess stenosis. Moderate right with mild left L4 foraminal narrowing. L5-S1: Degenerative intervertebral disc space narrowing with diffuse disc bulge and reactive endplate spurring. Superimposed left subarticular disc protrusion contacts and mildly displaces the descending left S1 nerve root. Mild-to-moderate facet hypertrophy. Resultant moderate left with mild right lateral recess stenosis. Mild bilateral L5 foraminal narrowing. IMPRESSION: MRI CERVICAL SPINE IMPRESSION: 1. Interval improvement in appearance of widespread osseous metastatic disease, consistent with interval response to therapy. No pathologic fracture. Previously seen extraosseous/epidural tumor at C5-6 is no longer clearly visualized, with no extraosseous or epidural tumor now seen. 2. Underlying multilevel cervical spondylosis with resultant mild-to-moderate spinal stenosis at C3-4 through C6-7 as above. Moderate left C4 and right C5 foraminal stenosis, with severe bilateral C6 foraminal narrowing. MRI THORACIC SPINE IMPRESSION: Marked interval improvement in appearance of widespread osseous metastatic disease, consistent with interval response to therapy. No pathologic fracture. No extra osseous or epidural tumor identified. MRI LUMBAR SPINE IMPRESSION: 1. Interval improvement in widespread osseous metastatic disease involving the lumbar spine and visualized pelvis. No pathologic fracture. Previously seen epidural tumor at the level of S1 has resolved as has the previously seen extraosseous tumor extension from the bilateral  ilium. No significant epidural or extraosseous disease now seen. 2. Interval improvement/resolution in previously seen retroperitoneal adenopathy. 3. Left subarticular disc protrusion at L5-S1, potentially affecting the left S1 nerve root. Additional more mild degenerative  spondylosis elsewhere within the lumbar spine as above. Electronically Signed   By: Jeannine Boga M.D.   On: 12/20/2020 05:37   ECHOCARDIOGRAM COMPLETE  Result Date: 12/19/2020    ECHOCARDIOGRAM REPORT   Patient Name:   Edgar Mooney Date of Exam: 12/19/2020 Medical Rec #:  [id number]     Height:       62.0 in Accession #:    [id number]    Weight:       160.0 lb Date of Birth:  29-Aug-1965     BSA:          1.739 m Patient Age:    55 years      BP:           160/79 mmHg Patient Gender: M             HR:           57 bpm. Exam Location:  Inpatient Procedure: 2D Echo Indications:    chest pain  History:        Patient has no prior history of Echocardiogram examinations.                 Risk Factors:Hypertension and polysubstance abuse.  Sonographer:    Johny Chess RDCS Referring Phys: [id number] [place]  1. Left ventricular ejection fraction, by estimation, is 50 to 55%. The left ventricle has low normal function. The left ventricle demonstrates regional wall motion abnormalities with basal to mid inferior hypokinesis. Left ventricular diastolic parameters were normal.  2. Right ventricular systolic function is normal. The right ventricular size is normal. Tricuspid regurgitation signal is inadequate for assessing PA pressure.  3. Left atrial size was mild to moderately dilated.  4. The mitral valve is normal in structure. No evidence of mitral valve regurgitation. No evidence of mitral stenosis.  5. The aortic valve is tricuspid. Aortic valve regurgitation is not visualized. No aortic stenosis is present.  6. The inferior vena cava is normal in size with greater than 50% respiratory variability, suggesting right atrial pressure of 3 mmHg. FINDINGS  Left Ventricle: Left ventricular ejection fraction, by estimation, is 50 to 55%. The left ventricle has low normal function. The left ventricle demonstrates regional wall motion abnormalities. The left ventricular internal cavity size was normal  in size. There is no left ventricular hypertrophy. Left ventricular diastolic parameters were normal. Right Ventricle: The right ventricular size is normal. No increase in right ventricular wall thickness. Right ventricular systolic function is normal. Tricuspid regurgitation signal is inadequate for assessing PA pressure. Left Atrium: Left atrial size was mild to moderately dilated. Right Atrium: Right atrial size was normal in size. Pericardium: There is no evidence of pericardial effusion. Mitral Valve: The mitral valve is normal in structure. No evidence of mitral valve regurgitation. No evidence of mitral valve stenosis. Tricuspid Valve: The tricuspid valve is normal in structure. Tricuspid valve regurgitation is not demonstrated. Aortic Valve: The aortic valve is tricuspid. Aortic valve regurgitation is not visualized. No aortic stenosis is present. Pulmonic Valve: The pulmonic valve was normal in structure. Pulmonic valve regurgitation is not visualized. Aorta: The aortic root is normal in size and structure. Venous: The inferior vena cava is normal in size with greater than 50% respiratory variability, suggesting right atrial pressure of  3 mmHg. IAS/Shunts: No atrial level shunt detected by color flow Doppler.  LEFT VENTRICLE PLAX 2D LVIDd:         5.10 cm  Diastology LVIDs:         3.20 cm  LV e' medial:    8.92 cm/s LV PW:         0.90 cm  LV E/e' medial:  11.0 LV IVS:        0.80 cm  LV e' lateral:   11.20 cm/s LVOT diam:     2.10 cm  LV E/e' lateral: 8.8 LV SV:         68 LV SV Index:   39 LVOT Area:     3.46 cm  RIGHT VENTRICLE             IVC RV S prime:     15.40 cm/s  IVC diam: 1.90 cm TAPSE (M-mode): 2.4 cm LEFT ATRIUM             Index       RIGHT ATRIUM           Index LA diam:        3.80 cm 2.19 cm/m  RA Area:     11.90 cm LA Vol (A2C):   75.9 ml 43.65 ml/m RA Volume:   26.70 ml  15.36 ml/m LA Vol (A4C):   69.1 ml 39.74 ml/m LA Biplane Vol: 75.3 ml 43.31 ml/m  AORTIC VALVE LVOT Vmax:    101.00 cm/s LVOT Vmean:  61.800 cm/s LVOT VTI:    0.197 m  AORTA Ao Root diam: 3.00 cm Ao Asc diam:  3.30 cm MITRAL VALVE MV Area (PHT): 3.00 cm    SHUNTS MV Decel Time: 253 msec    Systemic VTI:  0.20 m MV E velocity: 98.50 cm/s  Systemic Diam: 2.10 cm MV A velocity: 62.90 cm/s MV E/A ratio:  1.57 Dalton McleanMD Electronically signed by Franki Monte Signature Date/Time: 12/19/2020/5:32:35 PM    Final     Cardiac Studies   2D echo 12/19/20    1. Left ventricular ejection fraction, by estimation, is 50 to 55%. The  left ventricle has low normal function. The left ventricle demonstrates  regional wall motion abnormalities with basal to mid inferior hypokinesis.  Left ventricular diastolic  parameters were normal.   2. Right ventricular systolic function is normal. The right ventricular  size is normal. Tricuspid regurgitation signal is inadequate for assessing  PA pressure.   3. Left atrial size was mild to moderately dilated.   4. The mitral valve is normal in structure. No evidence of mitral valve  regurgitation. No evidence of mitral stenosis.   5. The aortic valve is tricuspid. Aortic valve regurgitation is not  visualized. No aortic stenosis is present.   6. The inferior vena cava is normal in size with greater than 50%  respiratory variability, suggesting right atrial pressure of 3 mmHg.   Patient Profile     55 y.o. male with polysubstance abuse (ETOH, cocaine), hepatitis C, metastatic prostate CA with bony mets, recent CT abdomen showing new liver malignancy (?mets vs primary hepatocellular CA) presented with chest pain and mildly elevated troponin.  Assessment & Plan    1. Chest pain/possible NSTEMI vs demand ischemia in context of cocaine use - hsTroponin 46->97, not trended further - EKG abnormal as above - also noted is patient's positive UDS for cocaine (persistent issue per EMR) - Dr. Johnsie Cancel felt CP was concerning for angina and  recommended consideration of cath today  but there is concern about malignancy workup as well - he had concern about using anticoagulation with spinal epidural mets seen on prior imaging in 2021. Repeat MRI of the spine yesterday showed "Interval improvement in appearance of widespread osseous metastatic disease, consistent with interval response to therapy. No pathologic fracture. Previously seen extraosseous/epidural tumor at C5-6 is no longer clearly visualized, with no extraosseous or epidural tumor now seen" - however, the issue still remains the pending workup of his possible new hepatocellular CA -if he undergoes cath/PCI, this may hinder plans for biopsy if needed. AFP ?? - started on ASA, currently tolerating this - avoid BB due to cocaine - avoid statin due to liver disease - 2D echo showed EF 50-55%, basal to mid inferior HK, mild-mod LAE - will discuss further with MD  2. Polysubstance abuse  - + cocaine, also hx ETOH, hindering prognosis, previous cessation advised  3. Metastatic prostate CA, also newly diagnosed liver lesion as well - further per IM/oncology   For questions or updates, please contact [place] Please consult [email or web address] for contact info under Cardiology/STEMI.  Signed, Charlie Pitter, PA-C 12/20/2020, 11:27 AM   As above, patient seen and examined.  He is not having chest pain or dyspnea today.  His troponins and electrocardiogram are suggestive of an acute coronary syndrome.  We will treat with aspirin, statin and add isosorbide 60 mg daily.  No beta-blocker in the setting of cocaine use.  Very complicated issue.  He has known metastatic disease including spinal epidural mets.  He also has a large lesion in his liver that may be newly diagnosed hepatocellular carcinoma.  I will therefore cancel the cardiac catheterization today.  We need input from oncology concerning risk of bleeding with spinal mets.  Also if he requires biopsy of liver met this would be difficult if he ended up having PCI  requiring dual antiplatelet therapy.  We will ask for a formal oncology consult.  Cardiac catheterization later pending that. Kirk Ruths, MD

## 2020-12-20 NOTE — Progress Notes (Signed)
PROGRESS NOTE    Edgar Mooney  [id number] DOB: April 26, 1965 DOA: 12/19/2020 PCP: Patient, No Pcp Per (Inactive)    Brief Narrative:  Edgar Mooney is a 55 year old male with past medical history significant for CAD, history of EtOH abuse, chronic hepatitis C, polysubstance abuse including cocaine use, anemia, metastatic prostate cancer with osseous metastases who presents to [facility] ED on 9/5 with chest pain.  Patient reports sharp chest pain with localization substernal and left anterior chest with radiation to his left arm, rated at 10/10 lasting for 10/15 minutes.  Associated with shortness of breath, sweating, vomiting.  Patient rested with resolution of symptoms.  Patient states exacerbates with intense activities.  Reports been having chest pain over the last 2-3 months with exertion.  Patient endorses 1 pack/day, cocaine "once in a blue moon", essential hypertension.  In the ED, temperature 98.0 F, HR 58, RR 19, BP 150/78, SPO2 97% on room air.  Sodium 138, potassium 3.8, chloride 111, CO2 20, glucose 106, BUN 14, creatinine 0.71, AST 34, ALT 38, total bilirubin 0.4.  WBC 6.9, hemoglobin 12.6, platelets 175.  High sensitive troponin 46> 97.  Chest x-ray with similar chronic interstitial lung changes with lower lung volumes, no superimposed acute process.  EKG with NSR, T wave inversions V1-V4, significant artifact which limits interpretability.  Patient was given sublingual nitroglycerin in ED.  Cardiology consulted.  TRH consulted for further evaluation and management of chest pain.   Assessment & Plan:   Principal Problem:   Unstable angina (HCC) Active Problems:   HTN (hypertension)   History of ETOH abuse   Gout   Hep C w/o coma, chronic (HCC)   Polysubstance abuse (HCC)   Cocaine abuse with cocaine-induced mood disorder (HCC)   Anemia   Prostate cancer ([place])   Unstable angina Patient presenting to ED with 2-3-week history of recurrent chest discomfort localized to  substernal/left chest region with radiation to left shoulder that is sharp in nature and worse with exertion, also with associated shortness of breath, sweating and vomiting.  Risk factors include tobacco abuse, hypertension, active cocaine use as seen on UDS on admission.  High-sensitivity troponin elevated 46>97.  LDL 115, HDL 36. --Cardiology following, appreciate assistance --Was not started on heparin drip due to previous imaging with concerns of epidural space mass --Aspirin --Isosorbide mononitrate 60 mg p.o. daily --No beta-blocker in the setting of active cocaine abuse --No LHC today until recommendations from oncology regarding spinal epidural meds and need for further work-up/biopsy liver mass. --Continue monitor on telemetry --N.p.o. after midnight  Prostate cancer with osseous metastases Follows with medical oncology outpatient, Dr. Benay Spice.  MR C/T/L-spine 9/6 with marked interval improvement in widespread osseous metastatic disease, previous extraosseous/epidural tumor at C5-6 no longer clearly visualized and previous epidural tumor level S1 has resolved with no extraosseous or epidural tumor now seen. --Oncology following, appreciate assistance --Zytiga 1000 mg p.o. daily --Prednisone 5 mg p.o. daily  Right hepatic lobe mass concerning for [place] CT abdomen/pelvis with contrast 8/27 with enhancing mass right hepatic lobe measuring 9.1 x 9.2 x 9.2 cm and appears to demonstrate washout enhancement on delayed imaging concerning for hepatocellular carcinoma.  AFP elevated [id number]. --MR w/ and w/o contrast Liver protocol  Essential hypertension Not on antihypertensives at home.  BP 157/81 this morning. --Started on isosorbide mononitrate 60 mg p.o. daily --Avoiding beta-blockers in the setting of active cocaine abuse --Continue to monitor BP closely  Cocaine abuse Patient states uses cocaine only "once in a blue  moon".  UDS was positive for cocaine, so must of been a blue moon over  the last few days.  Counseled patient extensively on need for complete cessation given his cardiac symptoms. --TOC for substance abuse counseling  History of EtOH abuse States does not drink much anymore; but difficult to believe as above.  Counseled on cessation.  TOC for substance abuse.  Hx gout: Reports does not take allopurinol anymore.  Hepatitis C, chronic Reports s/p treatment in the past.   DVT prophylaxis: SCDs Start: 12/19/20 1701   Code Status: Full Code Family Communication: none present at bedside this am  Disposition Plan:  Level of care: Telemetry Cardiac Status is: Observation  The patient remains OBS appropriate and will d/c before 2 midnights.  Dispo: The patient is from: Home              Anticipated d/c is to: Home              Patient currently is not medically stable to d/c.   Difficult to place patient No  Consultants:  Cardiology Medical oncology, [name]  Procedures:  None  Antimicrobials:  None   Subjective: Patient seen examined at bedside, sleeping but easily arousable.  States "when can I eat".  Also "when I am I have a my heart catheterization".  Discussed with him regarding his positive cocaine test, he is unaware.  Discussed with him that he needs complete cessation as this increases risk for cardiac events.  Seen by cardiology this morning, concerned about his epidural mets and liver lesion; requesting input from oncology regarding further work-up and risk for bleeding with anticoagulation especially if needs cardiac intervention and antiplatelet therapy.  Dr. Benay Spice to evaluate patient later this afternoon.  Will obtain MRI liver for further evaluation of right hepatic lesion.  Patient with no other complaints or concerns at this time.  Currently denies headache, no dizziness, no active chest pain, no palpitations, no current shortness of breath, no abdominal pain, no fever/chills/night sweats, no nausea/vomiting/diarrhea.  No acute  events overnight per nurse staff.  Objective: Vitals:   12/19/20 1830 12/19/20 1845 12/19/20 1910 12/20/20 0725  BP:  (!) 167/77 (!) 157/81 138/88  Pulse: 68 65 63 (!) 57  Resp: '17 16 20 18  '$ Temp:  98.2 F (36.8 C) 98.9 F (37.2 C) 98.4 F (36.9 C)  TempSrc:  Oral Oral Oral  SpO2: 94% 94% 99% 98%  Weight:   74.8 kg   Height:   '5\' 2"'$  (1.575 m)     Intake/Output Summary (Last 24 hours) at 12/20/2020 1151 Last data filed at 12/20/2020 [id number] Gross per 24 hour  Intake 295.24 ml  Output --  Net 295.24 ml   Filed Weights   12/19/20 1144 12/19/20 1910  Weight: 72.6 kg 74.8 kg    Examination:  General exam: Appears calm and comfortable, appears older than stated age Respiratory system: Clear to auscultation. Respiratory effort normal.  On room air Cardiovascular system: S1 & S2 heard, RRR. No JVD, murmurs, rubs, gallops or clicks. No pedal edema. Gastrointestinal system: Abdomen is nondistended, soft and nontender. No organomegaly or masses felt. Normal bowel sounds heard. Central nervous system: Alert and oriented. No focal neurological deficits. Extremities: Symmetric 5 x 5 power. Skin: No rashes, lesions or ulcers Psychiatry: Judgement and insight appear poor. Mood & affect appropriate.     Data Reviewed: I have personally reviewed following labs and imaging studies  CBC: Recent Labs  Lab 12/19/20 1158  WBC  6.9  HGB 12.6*  HCT 38.2*  MCV 95.5  PLT 0000000   Basic Metabolic Panel: Recent Labs  Lab 12/19/20 1158 12/19/20 1648  NA 138  --   K 3.8  --   CL 111  --   CO2 20*  --   GLUCOSE 106*  --   BUN 14  --   CREATININE 0.71  --   CALCIUM 9.2  --   MG  --  2.0   GFR: Estimated Creatinine Clearance: 92.5 mL/min (by C-G formula based on SCr of 0.71 mg/dL). Liver Function Tests: Recent Labs  Lab 12/19/20 1158  AST 34  ALT 38  ALKPHOS 62  BILITOT 0.4  PROT 6.2*  ALBUMIN 3.3*   No results for input(s): LIPASE, AMYLASE in the last 168 hours. No results for  input(s): AMMONIA in the last 168 hours. Coagulation Profile: Recent Labs  Lab 12/14/20 1003  INR 0.9   Cardiac Enzymes: No results for input(s): CKTOTAL, CKMB, CKMBINDEX, TROPONINI in the last 168 hours. BNP (last 3 results) No results for input(s): PROBNP in the last 8760 hours. HbA1C: Recent Labs    12/19/20 1648  HGBA1C 5.1   CBG: No results for input(s): GLUCAP in the last 168 hours. Lipid Profile: Recent Labs    12/19/20 1648  CHOL 201*  HDL 36*  LDLCALC 115*  TRIG 248*  CHOLHDL 5.6   Thyroid Function Tests: No results for input(s): TSH, T4TOTAL, FREET4, T3FREE, THYROIDAB in the last 72 hours. Anemia Panel: No results for input(s): VITAMINB12, FOLATE, FERRITIN, TIBC, IRON, RETICCTPCT in the last 72 hours. Sepsis Labs: No results for input(s): PROCALCITON, LATICACIDVEN in the last 168 hours.  Recent Results (from the past 240 hour(s))  SARS CORONAVIRUS 2 (TAT 6-24 HRS) Nasopharyngeal Nasopharyngeal Swab     Status: None   Collection Time: 12/19/20  6:37 PM   Specimen: Nasopharyngeal Swab  Result Value Ref Range Status   SARS Coronavirus 2 NEGATIVE NEGATIVE Final    Comment: (NOTE) SARS-CoV-2 target nucleic acids are NOT DETECTED.  The SARS-CoV-2 RNA is generally detectable in upper and lower respiratory specimens during the acute phase of infection. Negative results do not preclude SARS-CoV-2 infection, do not rule out co-infections with other pathogens, and should not be used as the sole basis for treatment or other patient management decisions. Negative results must be combined with clinical observations, patient history, and epidemiological information. The expected result is Negative.  Fact Sheet for Patients: SugarRoll.be  Fact Sheet for Healthcare Providers: https://www.woods-mathews.com/  This test is not yet approved or cleared by the [place] FDA and  has been authorized for detection and/or  diagnosis of SARS-CoV-2 by FDA under an Emergency Use Authorization (EUA). This EUA will remain  in effect (meaning this test can be used) for the duration of the COVID-19 declaration under Se ction 564(b)(1) of the Act, 21 U.S.C. section 360bbb-3(b)(1), unless the authorization is terminated or revoked sooner.  Performed at [facility], [address] [place], [place] [address]          Radiology Studies: DG Chest 2 View  Result Date: 12/19/2020 CLINICAL DATA:  Chest pain, diaphoresis EXAM: CHEST - 2 VIEW COMPARISON:  03/03/2020 FINDINGS: Lower lung volumes with similar diffuse bilateral interstitial opacities may represent chronic lung disease. Minor basilar atelectasis. Normal heart size and vascularity. No definite focal pneumonia, collapse or consolidation. Negative for effusion or pneumothorax. Trachea midline. Degenerative changes of the spine. IMPRESSION: Similar chronic interstitial lung changes with  lower lung volumes. No definite superimposed acute process. Electronically Signed   By: [name].  Shick M.D.   On: 12/19/2020 12:45   MR Cervical Spine W or Wo Contrast  Result Date: 12/20/2020 CLINICAL DATA:  Initial evaluation for intermittent left-sided chest pain, history of metastatic prostate cancer. EXAM: MRI CERVICAL, THORACIC AND LUMBAR SPINE WITHOUT AND WITH CONTRAST TECHNIQUE: Multiplanar and multiecho pulse sequences of the cervical spine, to include the craniocervical junction and cervicothoracic junction, and thoracic and lumbar spine, were obtained without and with intravenous contrast. CONTRAST:  33m GADAVIST GADOBUTROL 1 MMOL/ML IV SOLN COMPARISON:  Prior MRIs from 03/04/2020. FINDINGS: MRI CERVICAL SPINE FINDINGS Alignment: Mild straightening of the normal cervical lordosis. No listhesis. Vertebrae: Diffuse infiltrative low marrow signal intensity again seen throughout the visualized skull base and spine. Previously seen superimposed enhancing lesions are markedly  improved as compared to previous exam, compatible with interval response to therapy. Appearance is most evident at the posterior arch of C2 where only a small focus of enhancement is now seen on today's exam, previously fairly prominent on prior MRI. No definite new or progressive lesions. Vertebral body height maintained without pathologic or interval fracture. Previously question extra osseous tumor on the right at C5-6 is not convincingly seen on today's exam. No other new extra osseous extension. No epidural or intracanalicular involvement identified. Cord: Normal signal and morphology. No abnormal enhancement or intracanalicular involvement of tumor. Posterior Fossa, vertebral arteries, paraspinal tissues: Empty sella partially visualized. Visualized portions of the brain and posterior fossa otherwise unremarkable. Paraspinous and prevertebral soft tissues within normal limits. Normal flow voids seen within the vertebral arteries bilaterally. Disc levels: C2-C3: Negative interspace. Mild left-sided facet hypertrophy. No stenosis. C3-C4: Broad posterior disc osteophyte flattens the ventral thecal sac with resultant mild-to-moderate spinal stenosis. Superimposed uncovertebral and facet hypertrophy with resultant moderate left with mild right C4 foraminal stenosis. C4-C5: Mild disc bulge with uncovertebral spurring. Bilateral facet hypertrophy. No significant spinal stenosis. Moderate right with mild left C5 foraminal narrowing. C5-C6: Degenerative intervertebral disc space narrowing with diffuse disc osteophyte complex. Flattening and partial effacement of the ventral thecal sac, eccentric to the right. Mild spinal stenosis. Severe right worse than left C6 foraminal narrowing. C6-C7: Disc bulge with uncovertebral spurring. Superimposed left paracentral disc protrusion indents the left ventral thecal sac. Mild spinal stenosis. Mild to moderate left with mild right C7 foraminal narrowing. C7-T1:  Normal interspace.   No canal or foraminal stenosis. MRI THORACIC SPINE FINDINGS Alignment: Physiologic with preservation of the normal thoracic kyphosis. No listhesis. Vertebrae: Diffusely abnormal appearance of the visualized osseous structures, consistent with widespread osseous metastatic disease. Overall appearance is markedly improved as compared to previous exam, with decreased heterogeneous enhancement and STIR signal abnormality seen throughout the thoracic spine. Previously seen trace epidural tumor posterior to the T12 vertebral body is no longer visualized. No new extra osseous or epidural tumor identified. Vertebral body height maintained without pathologic or interval fracture. Cord: Normal signal and morphology. No epidural or intracanalicular tumor. No abnormal enhancement. Paraspinal and other soft tissues: Paraspinous soft tissues within normal limits. Splenomegaly partially visualized. Disc levels: No new significant disc pathology. No significant stenosis or neural impingement. MRI LUMBAR SPINE FINDINGS Segmentation: Standard. Lowest well-formed disc space labeled the L5-S1 level. Alignment: Physiologic with preservation of the normal lumbar lordosis. No listhesis. Vertebrae: Widespread osseous metastatic disease again seen throughout the visualized lumbar spine and pelvis. Overall, appearance is markedly improved with decreased heterogeneous enhancement seen throughout the affected structures as compared to  prior. Vertebral body height maintained without interval or pathologic fracture. Previously seen epidural tumor posterior to the S1 vertebral body has largely resolved, and is no longer seen. Similarly, previously seen extraosseous extension from the bilateral ilium is also no longer clearly visualized. No significant extraosseous extension now seen. No significant epidural or intracanalicular involvement. Conus medullaris and cauda equina: Conus extends to the T12 level. Conus and cauda equina appear normal.  Paraspinal and other soft tissues: Interval resolution in previously seen bulky retroperitoneal adenopathy. Disc levels: L1-2:  Unremarkable. L2-3:  Negative interspace.  Mild facet hypertrophy.  No stenosis. L3-4: Disc bulge with mild facet hypertrophy. No spinal stenosis. Mild left L3 foraminal narrowing. L4-5: Degenerative intervertebral disc space narrowing with diffuse disc bulge and reactive endplate spurring. Mild to moderate facet hypertrophy. Resultant mild canal with moderate bilateral lateral recess stenosis. Moderate right with mild left L4 foraminal narrowing. L5-S1: Degenerative intervertebral disc space narrowing with diffuse disc bulge and reactive endplate spurring. Superimposed left subarticular disc protrusion contacts and mildly displaces the descending left S1 nerve root. Mild-to-moderate facet hypertrophy. Resultant moderate left with mild right lateral recess stenosis. Mild bilateral L5 foraminal narrowing. IMPRESSION: MRI CERVICAL SPINE IMPRESSION: 1. Interval improvement in appearance of widespread osseous metastatic disease, consistent with interval response to therapy. No pathologic fracture. Previously seen extraosseous/epidural tumor at C5-6 is no longer clearly visualized, with no extraosseous or epidural tumor now seen. 2. Underlying multilevel cervical spondylosis with resultant mild-to-moderate spinal stenosis at C3-4 through C6-7 as above. Moderate left C4 and right C5 foraminal stenosis, with severe bilateral C6 foraminal narrowing. MRI THORACIC SPINE IMPRESSION: Marked interval improvement in appearance of widespread osseous metastatic disease, consistent with interval response to therapy. No pathologic fracture. No extra osseous or epidural tumor identified. MRI LUMBAR SPINE IMPRESSION: 1. Interval improvement in widespread osseous metastatic disease involving the lumbar spine and visualized pelvis. No pathologic fracture. Previously seen epidural tumor at the level of S1 has  resolved as has the previously seen extraosseous tumor extension from the bilateral ilium. No significant epidural or extraosseous disease now seen. 2. Interval improvement/resolution in previously seen retroperitoneal adenopathy. 3. Left subarticular disc protrusion at L5-S1, potentially affecting the left S1 nerve root. Additional more mild degenerative spondylosis elsewhere within the lumbar spine as above. Electronically Signed   By: Jeannine Boga M.D.   On: 12/20/2020 05:37   MR THORACIC SPINE W WO CONTRAST  Result Date: 12/20/2020 CLINICAL DATA:  Initial evaluation for intermittent left-sided chest pain, history of metastatic prostate cancer. EXAM: MRI CERVICAL, THORACIC AND LUMBAR SPINE WITHOUT AND WITH CONTRAST TECHNIQUE: Multiplanar and multiecho pulse sequences of the cervical spine, to include the craniocervical junction and cervicothoracic junction, and thoracic and lumbar spine, were obtained without and with intravenous contrast. CONTRAST:  14m GADAVIST GADOBUTROL 1 MMOL/ML IV SOLN COMPARISON:  Prior MRIs from 03/04/2020. FINDINGS: MRI CERVICAL SPINE FINDINGS Alignment: Mild straightening of the normal cervical lordosis. No listhesis. Vertebrae: Diffuse infiltrative low marrow signal intensity again seen throughout the visualized skull base and spine. Previously seen superimposed enhancing lesions are markedly improved as compared to previous exam, compatible with interval response to therapy. Appearance is most evident at the posterior arch of C2 where only a small focus of enhancement is now seen on today's exam, previously fairly prominent on prior MRI. No definite new or progressive lesions. Vertebral body height maintained without pathologic or interval fracture. Previously question extra osseous tumor on the right at C5-6 is not convincingly seen on today's exam. No other  new extra osseous extension. No epidural or intracanalicular involvement identified. Cord: Normal signal and  morphology. No abnormal enhancement or intracanalicular involvement of tumor. Posterior Fossa, vertebral arteries, paraspinal tissues: Empty sella partially visualized. Visualized portions of the brain and posterior fossa otherwise unremarkable. Paraspinous and prevertebral soft tissues within normal limits. Normal flow voids seen within the vertebral arteries bilaterally. Disc levels: C2-C3: Negative interspace. Mild left-sided facet hypertrophy. No stenosis. C3-C4: Broad posterior disc osteophyte flattens the ventral thecal sac with resultant mild-to-moderate spinal stenosis. Superimposed uncovertebral and facet hypertrophy with resultant moderate left with mild right C4 foraminal stenosis. C4-C5: Mild disc bulge with uncovertebral spurring. Bilateral facet hypertrophy. No significant spinal stenosis. Moderate right with mild left C5 foraminal narrowing. C5-C6: Degenerative intervertebral disc space narrowing with diffuse disc osteophyte complex. Flattening and partial effacement of the ventral thecal sac, eccentric to the right. Mild spinal stenosis. Severe right worse than left C6 foraminal narrowing. C6-C7: Disc bulge with uncovertebral spurring. Superimposed left paracentral disc protrusion indents the left ventral thecal sac. Mild spinal stenosis. Mild to moderate left with mild right C7 foraminal narrowing. C7-T1:  Normal interspace.  No canal or foraminal stenosis. MRI THORACIC SPINE FINDINGS Alignment: Physiologic with preservation of the normal thoracic kyphosis. No listhesis. Vertebrae: Diffusely abnormal appearance of the visualized osseous structures, consistent with widespread osseous metastatic disease. Overall appearance is markedly improved as compared to previous exam, with decreased heterogeneous enhancement and STIR signal abnormality seen throughout the thoracic spine. Previously seen trace epidural tumor posterior to the T12 vertebral body is no longer visualized. No new extra osseous or  epidural tumor identified. Vertebral body height maintained without pathologic or interval fracture. Cord: Normal signal and morphology. No epidural or intracanalicular tumor. No abnormal enhancement. Paraspinal and other soft tissues: Paraspinous soft tissues within normal limits. Splenomegaly partially visualized. Disc levels: No new significant disc pathology. No significant stenosis or neural impingement. MRI LUMBAR SPINE FINDINGS Segmentation: Standard. Lowest well-formed disc space labeled the L5-S1 level. Alignment: Physiologic with preservation of the normal lumbar lordosis. No listhesis. Vertebrae: Widespread osseous metastatic disease again seen throughout the visualized lumbar spine and pelvis. Overall, appearance is markedly improved with decreased heterogeneous enhancement seen throughout the affected structures as compared to prior. Vertebral body height maintained without interval or pathologic fracture. Previously seen epidural tumor posterior to the S1 vertebral body has largely resolved, and is no longer seen. Similarly, previously seen extraosseous extension from the bilateral ilium is also no longer clearly visualized. No significant extraosseous extension now seen. No significant epidural or intracanalicular involvement. Conus medullaris and cauda equina: Conus extends to the T12 level. Conus and cauda equina appear normal. Paraspinal and other soft tissues: Interval resolution in previously seen bulky retroperitoneal adenopathy. Disc levels: L1-2:  Unremarkable. L2-3:  Negative interspace.  Mild facet hypertrophy.  No stenosis. L3-4: Disc bulge with mild facet hypertrophy. No spinal stenosis. Mild left L3 foraminal narrowing. L4-5: Degenerative intervertebral disc space narrowing with diffuse disc bulge and reactive endplate spurring. Mild to moderate facet hypertrophy. Resultant mild canal with moderate bilateral lateral recess stenosis. Moderate right with mild left L4 foraminal narrowing.  L5-S1: Degenerative intervertebral disc space narrowing with diffuse disc bulge and reactive endplate spurring. Superimposed left subarticular disc protrusion contacts and mildly displaces the descending left S1 nerve root. Mild-to-moderate facet hypertrophy. Resultant moderate left with mild right lateral recess stenosis. Mild bilateral L5 foraminal narrowing. IMPRESSION: MRI CERVICAL SPINE IMPRESSION: 1. Interval improvement in appearance of widespread osseous metastatic disease, consistent with interval response to  therapy. No pathologic fracture. Previously seen extraosseous/epidural tumor at C5-6 is no longer clearly visualized, with no extraosseous or epidural tumor now seen. 2. Underlying multilevel cervical spondylosis with resultant mild-to-moderate spinal stenosis at C3-4 through C6-7 as above. Moderate left C4 and right C5 foraminal stenosis, with severe bilateral C6 foraminal narrowing. MRI THORACIC SPINE IMPRESSION: Marked interval improvement in appearance of widespread osseous metastatic disease, consistent with interval response to therapy. No pathologic fracture. No extra osseous or epidural tumor identified. MRI LUMBAR SPINE IMPRESSION: 1. Interval improvement in widespread osseous metastatic disease involving the lumbar spine and visualized pelvis. No pathologic fracture. 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 No significant epidural or extraosseous disease now seen. 2. Interval improvement/resolution in previously seen retroperitoneal adenopathy. 3. Left subarticular disc protrusion at L5-S1, potentially affecting the left S1 nerve root. Additional more mild degenerative spondylosis elsewhere within the lumbar spine as above. Electronically Signed   By: Jeannine Boga M.D.   On: 12/20/2020 05:37   MR Lumbar Spine W Wo Contrast  Result Date: 12/20/2020 CLINICAL DATA:  Initial  evaluation for intermittent left-sided chest pain, history of metastatic prostate cancer. EXAM: MRI CERVICAL, THORACIC AND LUMBAR SPINE WITHOUT AND WITH CONTRAST TECHNIQUE: Multiplanar and multiecho pulse sequences of the cervical spine, to include the craniocervical junction and cervicothoracic junction, and thoracic and lumbar spine, were obtained without and with intravenous contrast. CONTRAST:  11m GADAVIST GADOBUTROL 1 MMOL/ML IV SOLN COMPARISON:  Prior MRIs from 03/04/2020. FINDINGS: MRI CERVICAL SPINE FINDINGS Alignment: Mild straightening of the normal cervical lordosis. No listhesis. Vertebrae: Diffuse infiltrative low marrow signal intensity again seen throughout the visualized skull base and spine. Previously seen superimposed enhancing lesions are markedly improved as compared to previous exam, compatible with interval response to therapy. Appearance is most evident at the posterior arch of C2 where only a small focus of enhancement is now seen on today's exam, previously fairly prominent on prior MRI. No definite new or progressive lesions. Vertebral body height maintained without pathologic or interval fracture. Previously question extra osseous tumor on the right at C5-6 is not convincingly seen on today's exam. No other new extra osseous extension. No epidural or intracanalicular involvement identified. Cord: Normal signal and morphology. No abnormal enhancement or intracanalicular involvement of tumor. Posterior Fossa, vertebral arteries, paraspinal tissues: Empty sella partially visualized. Visualized portions of the brain and posterior fossa otherwise unremarkable. Paraspinous and prevertebral soft tissues within normal limits. Normal flow voids seen within the vertebral arteries bilaterally. Disc levels: C2-C3: Negative interspace. Mild left-sided facet hypertrophy. No stenosis. C3-C4: Broad posterior disc osteophyte flattens the ventral thecal sac with resultant mild-to-moderate spinal stenosis.  Superimposed uncovertebral and facet hypertrophy with resultant moderate left with mild right C4 foraminal stenosis. C4-C5: Mild disc bulge with uncovertebral spurring. Bilateral facet hypertrophy. No significant spinal stenosis. Moderate right with mild left C5 foraminal narrowing. C5-C6: Degenerative intervertebral disc space narrowing with diffuse disc osteophyte complex. Flattening and partial effacement of the ventral thecal sac, eccentric to the right. Mild spinal stenosis. Severe right worse than left C6 foraminal narrowing. C6-C7: Disc bulge with uncovertebral spurring. Superimposed left paracentral disc protrusion indents the left ventral thecal sac. Mild spinal stenosis. Mild to moderate left with mild right C7 foraminal narrowing. C7-T1:  Normal interspace.  No canal or foraminal stenosis. MRI THORACIC SPINE FINDINGS Alignment: Physiologic with preservation of the normal thoracic kyphosis. No listhesis. Vertebrae: Diffusely abnormal appearance of  the visualized osseous structures, consistent with widespread osseous metastatic disease. Overall appearance is markedly improved as compared to previous exam, with decreased heterogeneous enhancement and STIR signal abnormality seen throughout the thoracic spine. Previously seen trace epidural tumor posterior to the T12 vertebral body is no longer visualized. No new extra osseous or epidural tumor identified. Vertebral body height maintained without pathologic or interval fracture. Cord: Normal signal and morphology. No epidural or intracanalicular tumor. No abnormal enhancement. Paraspinal and other soft tissues: Paraspinous soft tissues within normal limits. Splenomegaly partially visualized. Disc levels: No new significant disc pathology. No significant stenosis or neural impingement. MRI LUMBAR SPINE FINDINGS Segmentation: Standard. Lowest well-formed disc space labeled the L5-S1 level. Alignment: Physiologic with preservation of the normal lumbar lordosis.  No listhesis. Vertebrae: Widespread osseous metastatic disease again seen throughout the visualized lumbar spine and pelvis. Overall, appearance is markedly improved with decreased heterogeneous enhancement seen throughout the affected structures as compared to prior. Vertebral body height maintained without interval or pathologic fracture. Previously seen epidural tumor posterior to the S1 vertebral body has largely resolved, and is no longer seen. Similarly, previously seen extraosseous extension from the bilateral ilium is also no longer clearly visualized. No significant extraosseous extension now seen. No significant epidural or intracanalicular involvement. Conus medullaris and cauda equina: Conus extends to the T12 level. Conus and cauda equina appear normal. Paraspinal and other soft tissues: Interval resolution in previously seen bulky retroperitoneal adenopathy. Disc levels: L1-2:  Unremarkable. L2-3:  Negative interspace.  Mild facet hypertrophy.  No stenosis. L3-4: Disc bulge with mild facet hypertrophy. No spinal stenosis. Mild left L3 foraminal narrowing. L4-5: Degenerative intervertebral disc space narrowing with diffuse disc bulge and reactive endplate spurring. Mild to moderate facet hypertrophy. Resultant mild canal with moderate bilateral lateral recess stenosis. Moderate right with mild left L4 foraminal narrowing. L5-S1: Degenerative intervertebral disc space narrowing with diffuse disc bulge and reactive endplate spurring. Superimposed left subarticular disc protrusion contacts and mildly displaces the descending left S1 nerve root. Mild-to-moderate facet hypertrophy. Resultant moderate left with mild right lateral recess stenosis. Mild bilateral L5 foraminal narrowing. IMPRESSION: MRI CERVICAL SPINE IMPRESSION: 1. Interval improvement in appearance of widespread osseous metastatic disease, consistent with interval response to therapy. No pathologic fracture. Previously seen  extraosseous/epidural tumor at C5-6 is no longer clearly visualized, with no extraosseous or epidural tumor now seen. 2. Underlying multilevel cervical spondylosis with resultant mild-to-moderate spinal stenosis at C3-4 through C6-7 as above. Moderate left C4 and right C5 foraminal stenosis, with severe bilateral C6 foraminal narrowing. MRI THORACIC SPINE IMPRESSION: Marked interval improvement in appearance of widespread osseous metastatic disease, consistent with interval response to therapy. No pathologic fracture. No extra osseous or epidural tumor identified. MRI LUMBAR SPINE IMPRESSION: 1. Interval improvement in widespread osseous metastatic disease involving the lumbar spine and visualized pelvis. No pathologic fracture. 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 No significant epidural or extraosseous disease now seen. 2. Interval improvement/resolution in previously seen retroperitoneal adenopathy. 3. Left subarticular disc protrusion at L5-S1, potentially affecting the left S1 nerve root. Additional more mild degenerative spondylosis elsewhere within the lumbar spine as above. Electronically Signed   By: Jeannine Boga M.D.   On: 12/20/2020 05:37   ECHOCARDIOGRAM COMPLETE  Result Date: 12/19/2020    ECHOCARDIOGRAM REPORT   Patient Name:   Edgar Mooney Date of Exam: 12/19/2020 Medical Rec #:  [id number]  Height:       62.0 in Accession #:    [id number]    Weight:       160.0 lb Date of Birth:  Sep 03, 1965     BSA:          1.739 m Patient Age:    89 years      BP:           160/79 mmHg Patient Gender: M             HR:           57 bpm. Exam Location:  Inpatient Procedure: 2D Echo Indications:    chest pain  History:        Patient has no prior history of Echocardiogram examinations.                 Risk Factors:Hypertension and polysubstance abuse.  Sonographer:    Johny Chess RDCS  Referring Phys: [id number] [place]  1. Left ventricular ejection fraction, by estimation, is 50 to 55%. The left ventricle has low normal function. The left ventricle demonstrates regional wall motion abnormalities with basal to mid inferior hypokinesis. Left ventricular diastolic parameters were normal.  2. Right ventricular systolic function is normal. The right ventricular size is normal. Tricuspid regurgitation signal is inadequate for assessing PA pressure.  3. Left atrial size was mild to moderately dilated.  4. The mitral valve is normal in structure. No evidence of mitral valve regurgitation. No evidence of mitral stenosis.  5. The aortic valve is tricuspid. Aortic valve regurgitation is not visualized. No aortic stenosis is present.  6. The inferior vena cava is normal in size with greater than 50% respiratory variability, suggesting right atrial pressure of 3 mmHg. FINDINGS  Left Ventricle: Left ventricular ejection fraction, by estimation, is 50 to 55%. The left ventricle has low normal function. The left ventricle demonstrates regional wall motion abnormalities. The left ventricular internal cavity size was normal in size. There is no left ventricular hypertrophy. Left ventricular diastolic parameters were normal. Right Ventricle: The right ventricular size is normal. No increase in right ventricular wall thickness. Right ventricular systolic function is normal. Tricuspid regurgitation signal is inadequate for assessing PA pressure. Left Atrium: Left atrial size was mild to moderately dilated. Right Atrium: Right atrial size was normal in size. Pericardium: There is no evidence of pericardial effusion. Mitral Valve: The mitral valve is normal in structure. No evidence of mitral valve regurgitation. No evidence of mitral valve stenosis. Tricuspid Valve: The tricuspid valve is normal in structure. Tricuspid valve regurgitation is not demonstrated. Aortic Valve: The aortic valve is tricuspid.  Aortic valve regurgitation is not visualized. No aortic stenosis is present. Pulmonic Valve: The pulmonic valve was normal in structure. Pulmonic valve regurgitation is not visualized. Aorta: The aortic root is normal in size and structure. Venous: The inferior vena cava is normal in size with greater than 50% respiratory variability, suggesting right atrial pressure of 3 mmHg. IAS/Shunts: No atrial level shunt detected by color flow Doppler.  LEFT VENTRICLE PLAX 2D LVIDd:         5.10 cm  Diastology LVIDs:         3.20 cm  LV e' medial:    8.92 cm/s LV PW:         0.90 cm  LV E/e' medial:  11.0 LV IVS:        0.80 cm  LV e' lateral:   11.20 cm/s LVOT diam:  2.10 cm  LV E/e' lateral: 8.8 LV SV:         68 LV SV Index:   39 LVOT Area:     3.46 cm  RIGHT VENTRICLE             IVC RV S prime:     15.40 cm/s  IVC diam: 1.90 cm TAPSE (M-mode): 2.4 cm LEFT ATRIUM             Index       RIGHT ATRIUM           Index LA diam:        3.80 cm 2.19 cm/m  RA Area:     11.90 cm LA Vol (A2C):   75.9 ml 43.65 ml/m RA Volume:   26.70 ml  15.36 ml/m LA Vol (A4C):   69.1 ml 39.74 ml/m LA Biplane Vol: 75.3 ml 43.31 ml/m  AORTIC VALVE LVOT Vmax:   101.00 cm/s LVOT Vmean:  61.800 cm/s LVOT VTI:    0.197 m  AORTA Ao Root diam: 3.00 cm Ao Asc diam:  3.30 cm MITRAL VALVE MV Area (PHT): 3.00 cm    SHUNTS MV Decel Time: 253 msec    Systemic VTI:  0.20 m MV E velocity: 98.50 cm/s  Systemic Diam: 2.10 cm MV A velocity: 62.90 cm/s MV E/A ratio:  1.57 Dalton McleanMD Electronically signed by Franki Monte Signature Date/Time: 12/19/2020/5:32:35 PM    Final         Scheduled Meds:  abiraterone acetate  1,000 mg Oral Daily   aspirin EC  81 mg Oral Daily   predniSONE  5 mg Oral Q breakfast   sodium chloride flush  3 mL Intravenous Q12H   sodium chloride flush  3 mL Intravenous Q12H   tamsulosin  0.4 mg Oral Daily   Continuous Infusions:  sodium chloride     sodium chloride     sodium chloride 1 mL/kg/hr (12/20/20 0543)      LOS: 0 days    Time spent: 39 minutes spent on chart review, discussion with nursing staff, consultants, updating family and interview/physical exam; more than 50% of that time was spent in counseling and/or coordination of care.    Crystallynn Noorani J [place] DO [facility] Available via Epic secure chat 7am-7pm After these hours, please refer to coverage provider listed on [email or web address] 12/20/2020, 11:51 AM

## 2020-12-20 NOTE — Progress Notes (Addendum)
Patient has order for MRI of liver. Confirmed pt is not claustrophobic. Patient refused to have MRI until after he eats lunch. MRI notified. MRI states patient must be NPO for 12 hours.  Daymon Larsen, RN

## 2020-12-20 NOTE — Progress Notes (Signed)
ANTICOAGULATION CONSULT NOTE - Initial Consult  Pharmacy Consult for Heparin Indication: chest pain/ACS  No Known Allergies  Patient Measurements: Height: '5\' 2"'$  (157.5 cm) Weight: 74.8 kg (164 lb 14.4 oz) IBW/kg (Calculated) : 54.6  Heparin Dosing Weight: 70.2 kg  Vital Signs: Temp: 98.5 F (36.9 C) (09/06 1959) Temp Source: Oral (09/06 1959) BP: 114/73 (09/06 1959) Pulse Rate: 56 (09/06 1959)  Labs: Recent Labs    12/19/20 1158 12/19/20 1428 12/20/20 2040  HGB 12.6*  --   --   HCT 38.2*  --   --   PLT 175  --   --   HEPARINUNFRC  --   --  <0.10*  CREATININE 0.71  --   --   TROPONINIHS 46* 97*  --      Estimated Creatinine Clearance: 92.5 mL/min (by C-G formula based on SCr of 0.71 mg/dL).   Medical History: Past Medical History:  Diagnosis Date   Alcohol abuse    Cocaine abuse ([place])    Depression    Gout    Hep C w/o coma, chronic ([place]) diagnosed May 2016    Medications:  Scheduled:   abiraterone acetate  1,000 mg Oral Daily   aspirin EC  81 mg Oral Daily   isosorbide mononitrate  60 mg Oral Daily   predniSONE  5 mg Oral Q breakfast   sodium chloride flush  3 mL Intravenous Q12H   tamsulosin  0.4 mg Oral Daily   Infusions:   sodium chloride     heparin 850 Units/hr (12/20/20 2028)    Assessment: 55 y.o. male presented on 9/05 for ongoing chest pain. Initially, patient was not started on heparin due to previous imaging with concerns of epidural space mass. Per Cardiology recommendations, OK to initiate IV heparin for ACS which will also allow ability to assess tolerance to anticoagulants. Possible LHC 9/07.  H/h stable at 12.6/38.2 with no s/s bleeding per chart review. Discussed with Cardiology, will not give bolus.   Initial Heparin level undetectable.  Goal of Therapy:  Heparin level 0.3-0.7 units/ml Monitor platelets by anticoagulation protocol: Yes   Plan:  Increase heparin infusion to 1000 units/hr Check heparin level in 6 hours and  daily while on heparin Continue to monitor H&H and platelets  Alanda Slim, PharmD, [facility] Clinical Pharmacist Please see AMION for all Pharmacists' Contact Phone Numbers 12/20/2020, 9:29 PM

## 2020-12-20 NOTE — Progress Notes (Addendum)
HEMATOLOGY-ONCOLOGY PROGRESS NOTE  SUBJECTIVE: Edgar Mooney is followed by our office for metastatic prostate cancer.  He also recently was found to have a right liver mass which was suspicious for [facility].  AFP was also significantly elevated.  Now admitted for chest pain/NSTEMI.  May need cardiac catheterization this admission.  Patient reports improvement in his chest pain today.  No bleeding reported.  He otherwise has no other complaints today.  PHYSICAL EXAMINATION:  Vitals:   12/19/20 1910 12/20/20 0725  BP: (!) 157/81 138/88  Pulse: 63 (!) 57  Resp: 20 18  Temp: 98.9 F (37.2 C) 98.4 F (36.9 C)  SpO2: 99% 98%   Filed Weights   12/19/20 1144 12/19/20 1910  Weight: 72.6 kg 74.8 kg    Intake/Output from previous day: 09/05 0701 - 09/06 0700 In: 295.2 [I.V.:295.2] Out: -   GENERAL:alert, no distress and comfortable SKIN: skin color, texture, turgor are normal, no rashes or significant lesions LYMPH:  no palpable lymphadenopathy in the cervical, axillary or inguinal LUNGS: clear to auscultation and percussion with normal breathing effort HEART: regular rate & rhythm and no murmurs and no lower extremity edema ABDOMEN:abdomen soft, non-tender and normal bowel sounds NEURO: alert & oriented x 3 with fluent speech, no focal motor/sensory deficits  LABORATORY DATA:  I have reviewed the data as listed CMP Latest Ref Rng & Units 12/19/2020 12/10/2020 10/03/2020  Glucose 70 - 99 mg/dL 106(H) 112(H) 91  BUN 6 - 20 mg/dL 14 10 22(H)  Creatinine 0.61 - 1.24 mg/dL 0.71 0.74 0.73  Sodium 135 - 145 mmol/L 138 139 139  Potassium 3.5 - 5.1 mmol/L 3.8 4.2 4.3  Chloride 98 - 111 mmol/L 111 108 106  CO2 22 - 32 mmol/L 20(L) 23 21(L)  Calcium 8.9 - 10.3 mg/dL 9.2 9.4 9.9  Total Protein 6.5 - 8.1 g/dL 6.2(L) 6.8 6.9  Total Bilirubin 0.3 - 1.2 mg/dL 0.4 1.0 1.0  Alkaline Phos 38 - 126 U/L 62 75 83  AST 15 - 41 U/L 34 49(H) 40  ALT 0 - 44 U/L 38 52(H) 52(H)    Lab Results  Component Value  Date   WBC 6.9 12/19/2020   HGB 12.6 (L) 12/19/2020   HCT 38.2 (L) 12/19/2020   MCV 95.5 12/19/2020   PLT 175 12/19/2020   NEUTROABS 4.0 10/03/2020    DG Chest 2 View  Result Date: 12/19/2020 CLINICAL DATA:  Chest pain, diaphoresis EXAM: CHEST - 2 VIEW COMPARISON:  03/03/2020 FINDINGS: Lower lung volumes with similar diffuse bilateral interstitial opacities may represent chronic lung disease. Minor basilar atelectasis. Normal heart size and vascularity. No definite focal pneumonia, collapse or consolidation. Negative for effusion or pneumothorax. Trachea midline. Degenerative changes of the spine. IMPRESSION: Similar chronic interstitial lung changes with lower lung volumes. No definite superimposed acute process. Electronically Signed   By: [name].  Shick M.D.   On: 12/19/2020 12:45   MR Cervical Spine W or Wo Contrast  Result Date: 12/20/2020 CLINICAL DATA:  Initial evaluation for intermittent left-sided chest pain, history of metastatic prostate cancer. EXAM: MRI CERVICAL, THORACIC AND LUMBAR SPINE WITHOUT AND WITH CONTRAST TECHNIQUE: Multiplanar and multiecho pulse sequences of the cervical spine, to include the craniocervical junction and cervicothoracic junction, and thoracic and lumbar spine, were obtained without and with intravenous contrast. CONTRAST:  78m GADAVIST GADOBUTROL 1 MMOL/ML IV SOLN COMPARISON:  Prior MRIs from 03/04/2020. FINDINGS: MRI CERVICAL SPINE FINDINGS Alignment: Mild straightening of the normal cervical lordosis. No listhesis. Vertebrae: Diffuse infiltrative low marrow  signal intensity again seen throughout the visualized skull base and spine. Previously seen superimposed enhancing lesions are markedly improved as compared to previous exam, compatible with interval response to therapy. Appearance is most evident at the posterior arch of C2 where only a small focus of enhancement is now seen on today's exam, previously fairly prominent on prior MRI. No definite new or progressive  lesions. Vertebral body height maintained without pathologic or interval fracture. Previously question extra osseous tumor on the right at C5-6 is not convincingly seen on today's exam. No other new extra osseous extension. No epidural or intracanalicular involvement identified. Cord: Normal signal and morphology. No abnormal enhancement or intracanalicular involvement of tumor. Posterior Fossa, vertebral arteries, paraspinal tissues: Empty sella partially visualized. Visualized portions of the brain and posterior fossa otherwise unremarkable. Paraspinous and prevertebral soft tissues within normal limits. Normal flow voids seen within the vertebral arteries bilaterally. Disc levels: C2-C3: Negative interspace. Mild left-sided facet hypertrophy. No stenosis. C3-C4: Broad posterior disc osteophyte flattens the ventral thecal sac with resultant mild-to-moderate spinal stenosis. Superimposed uncovertebral and facet hypertrophy with resultant moderate left with mild right C4 foraminal stenosis. C4-C5: Mild disc bulge with uncovertebral spurring. Bilateral facet hypertrophy. No significant spinal stenosis. Moderate right with mild left C5 foraminal narrowing. C5-C6: Degenerative intervertebral disc space narrowing with diffuse disc osteophyte complex. Flattening and partial effacement of the ventral thecal sac, eccentric to the right. Mild spinal stenosis. Severe right worse than left C6 foraminal narrowing. C6-C7: Disc bulge with uncovertebral spurring. Superimposed left paracentral disc protrusion indents the left ventral thecal sac. Mild spinal stenosis. Mild to moderate left with mild right C7 foraminal narrowing. C7-T1:  Normal interspace.  No canal or foraminal stenosis. MRI THORACIC SPINE FINDINGS Alignment: Physiologic with preservation of the normal thoracic kyphosis. No listhesis. Vertebrae: Diffusely abnormal appearance of the visualized osseous structures, consistent with widespread osseous metastatic  disease. Overall appearance is markedly improved as compared to previous exam, with decreased heterogeneous enhancement and STIR signal abnormality seen throughout the thoracic spine. Previously seen trace epidural tumor posterior to the T12 vertebral body is no longer visualized. No new extra osseous or epidural tumor identified. Vertebral body height maintained without pathologic or interval fracture. Cord: Normal signal and morphology. No epidural or intracanalicular tumor. No abnormal enhancement. Paraspinal and other soft tissues: Paraspinous soft tissues within normal limits. Splenomegaly partially visualized. Disc levels: No new significant disc pathology. No significant stenosis or neural impingement. MRI LUMBAR SPINE FINDINGS Segmentation: Standard. Lowest well-formed disc space labeled the L5-S1 level. Alignment: Physiologic with preservation of the normal lumbar lordosis. No listhesis. Vertebrae: Widespread osseous metastatic disease again seen throughout the visualized lumbar spine and pelvis. Overall, appearance is markedly improved with decreased heterogeneous enhancement seen throughout the affected structures as compared to prior. Vertebral body height maintained without interval or pathologic fracture. Previously seen epidural tumor posterior to the S1 vertebral body has largely resolved, and is no longer seen. Similarly, previously seen extraosseous extension from the bilateral ilium is also no longer clearly visualized. No significant extraosseous extension now seen. No significant epidural or intracanalicular involvement. Conus medullaris and cauda equina: Conus extends to the T12 level. Conus and cauda equina appear normal. Paraspinal and other soft tissues: Interval resolution in previously seen bulky retroperitoneal adenopathy. Disc levels: L1-2:  Unremarkable. L2-3:  Negative interspace.  Mild facet hypertrophy.  No stenosis. L3-4: Disc bulge with mild facet hypertrophy. No spinal stenosis.  Mild left L3 foraminal narrowing. L4-5: Degenerative intervertebral disc space narrowing with diffuse disc  bulge and reactive endplate spurring. Mild to moderate facet hypertrophy. Resultant mild canal with moderate bilateral lateral recess stenosis. Moderate right with mild left L4 foraminal narrowing. L5-S1: Degenerative intervertebral disc space narrowing with diffuse disc bulge and reactive endplate spurring. Superimposed left subarticular disc protrusion contacts and mildly displaces the descending left S1 nerve root. Mild-to-moderate facet hypertrophy. Resultant moderate left with mild right lateral recess stenosis. Mild bilateral L5 foraminal narrowing. IMPRESSION: MRI CERVICAL SPINE IMPRESSION: 1. Interval improvement in appearance of widespread osseous metastatic disease, consistent with interval response to therapy. No pathologic fracture. Previously seen extraosseous/epidural tumor at C5-6 is no longer clearly visualized, with no extraosseous or epidural tumor now seen. 2. Underlying multilevel cervical spondylosis with resultant mild-to-moderate spinal stenosis at C3-4 through C6-7 as above. Moderate left C4 and right C5 foraminal stenosis, with severe bilateral C6 foraminal narrowing. MRI THORACIC SPINE IMPRESSION: Marked interval improvement in appearance of widespread osseous metastatic disease, consistent with interval response to therapy. No pathologic fracture. No extra osseous or epidural tumor identified. MRI LUMBAR SPINE IMPRESSION: 1. Interval improvement in widespread osseous metastatic disease involving the lumbar spine and visualized pelvis. No pathologic fracture. 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 No significant epidural or extraosseous disease now seen. 2. Interval improvement/resolution in previously seen retroperitoneal adenopathy. 3. Left subarticular disc protrusion at L5-S1,  potentially affecting the left S1 nerve root. Additional more mild degenerative spondylosis elsewhere within the lumbar spine as above. Electronically Signed   By: Jeannine Boga M.D.   On: 12/20/2020 05:37   MR THORACIC SPINE W WO CONTRAST  Result Date: 12/20/2020 CLINICAL DATA:  Initial evaluation for intermittent left-sided chest pain, history of metastatic prostate cancer. EXAM: MRI CERVICAL, THORACIC AND LUMBAR SPINE WITHOUT AND WITH CONTRAST TECHNIQUE: Multiplanar and multiecho pulse sequences of the cervical spine, to include the craniocervical junction and cervicothoracic junction, and thoracic and lumbar spine, were obtained without and with intravenous contrast. CONTRAST:  2m GADAVIST GADOBUTROL 1 MMOL/ML IV SOLN COMPARISON:  Prior MRIs from 03/04/2020. FINDINGS: MRI CERVICAL SPINE FINDINGS Alignment: Mild straightening of the normal cervical lordosis. No listhesis. Vertebrae: Diffuse infiltrative low marrow signal intensity again seen throughout the visualized skull base and spine. Previously seen superimposed enhancing lesions are markedly improved as compared to previous exam, compatible with interval response to therapy. Appearance is most evident at the posterior arch of C2 where only a small focus of enhancement is now seen on today's exam, previously fairly prominent on prior MRI. No definite new or progressive lesions. Vertebral body height maintained without pathologic or interval fracture. Previously question extra osseous tumor on the right at C5-6 is not convincingly seen on today's exam. No other new extra osseous extension. No epidural or intracanalicular involvement identified. Cord: Normal signal and morphology. No abnormal enhancement or intracanalicular involvement of tumor. Posterior Fossa, vertebral arteries, paraspinal tissues: Empty sella partially visualized. Visualized portions of the brain and posterior fossa otherwise unremarkable. Paraspinous and prevertebral soft  tissues within normal limits. Normal flow voids seen within the vertebral arteries bilaterally. Disc levels: C2-C3: Negative interspace. Mild left-sided facet hypertrophy. No stenosis. C3-C4: Broad posterior disc osteophyte flattens the ventral thecal sac with resultant mild-to-moderate spinal stenosis. Superimposed uncovertebral and facet hypertrophy with resultant moderate left with mild right C4 foraminal stenosis. C4-C5: Mild disc bulge with uncovertebral spurring. Bilateral facet hypertrophy. No significant spinal stenosis. Moderate right with mild left C5 foraminal narrowing. C5-C6: Degenerative  intervertebral disc space narrowing with diffuse disc osteophyte complex. Flattening and partial effacement of the ventral thecal sac, eccentric to the right. Mild spinal stenosis. Severe right worse than left C6 foraminal narrowing. C6-C7: Disc bulge with uncovertebral spurring. Superimposed left paracentral disc protrusion indents the left ventral thecal sac. Mild spinal stenosis. Mild to moderate left with mild right C7 foraminal narrowing. C7-T1:  Normal interspace.  No canal or foraminal stenosis. MRI THORACIC SPINE FINDINGS Alignment: Physiologic with preservation of the normal thoracic kyphosis. No listhesis. Vertebrae: Diffusely abnormal appearance of the visualized osseous structures, consistent with widespread osseous metastatic disease. Overall appearance is markedly improved as compared to previous exam, with decreased heterogeneous enhancement and STIR signal abnormality seen throughout the thoracic spine. Previously seen trace epidural tumor posterior to the T12 vertebral body is no longer visualized. No new extra osseous or epidural tumor identified. Vertebral body height maintained without pathologic or interval fracture. Cord: Normal signal and morphology. No epidural or intracanalicular tumor. No abnormal enhancement. Paraspinal and other soft tissues: Paraspinous soft tissues within normal limits.  Splenomegaly partially visualized. Disc levels: No new significant disc pathology. No significant stenosis or neural impingement. MRI LUMBAR SPINE FINDINGS Segmentation: Standard. Lowest well-formed disc space labeled the L5-S1 level. Alignment: Physiologic with preservation of the normal lumbar lordosis. No listhesis. Vertebrae: Widespread osseous metastatic disease again seen throughout the visualized lumbar spine and pelvis. Overall, appearance is markedly improved with decreased heterogeneous enhancement seen throughout the affected structures as compared to prior. Vertebral body height maintained without interval or pathologic fracture. Previously seen epidural tumor posterior to the S1 vertebral body has largely resolved, and is no longer seen. Similarly, previously seen extraosseous extension from the bilateral ilium is also no longer clearly visualized. No significant extraosseous extension now seen. No significant epidural or intracanalicular involvement. Conus medullaris and cauda equina: Conus extends to the T12 level. Conus and cauda equina appear normal. Paraspinal and other soft tissues: Interval resolution in previously seen bulky retroperitoneal adenopathy. Disc levels: L1-2:  Unremarkable. L2-3:  Negative interspace.  Mild facet hypertrophy.  No stenosis. L3-4: Disc bulge with mild facet hypertrophy. No spinal stenosis. Mild left L3 foraminal narrowing. L4-5: Degenerative intervertebral disc space narrowing with diffuse disc bulge and reactive endplate spurring. Mild to moderate facet hypertrophy. Resultant mild canal with moderate bilateral lateral recess stenosis. Moderate right with mild left L4 foraminal narrowing. L5-S1: Degenerative intervertebral disc space narrowing with diffuse disc bulge and reactive endplate spurring. Superimposed left subarticular disc protrusion contacts and mildly displaces the descending left S1 nerve root. Mild-to-moderate facet hypertrophy. Resultant moderate left  with mild right lateral recess stenosis. Mild bilateral L5 foraminal narrowing. IMPRESSION: MRI CERVICAL SPINE IMPRESSION: 1. Interval improvement in appearance of widespread osseous metastatic disease, consistent with interval response to therapy. No pathologic fracture. Previously seen extraosseous/epidural tumor at C5-6 is no longer clearly visualized, with no extraosseous or epidural tumor now seen. 2. Underlying multilevel cervical spondylosis with resultant mild-to-moderate spinal stenosis at C3-4 through C6-7 as above. Moderate left C4 and right C5 foraminal stenosis, with severe bilateral C6 foraminal narrowing. MRI THORACIC SPINE IMPRESSION: Marked interval improvement in appearance of widespread osseous metastatic disease, consistent with interval response to therapy. No pathologic fracture. No extra osseous or epidural tumor identified. MRI LUMBAR SPINE IMPRESSION: 1. Interval improvement in widespread osseous metastatic disease involving the lumbar spine and visualized pelvis. No pathologic fracture. Previously seen epidural tumor at the level of S1 has resolved as has the previously seen extraosseous tumor extension from the  bilateral ilium. No significant epidural or extraosseous disease now seen. 2. Interval improvement/resolution in previously seen retroperitoneal adenopathy. 3. Left subarticular disc protrusion at L5-S1, potentially affecting the left S1 nerve root. Additional more mild degenerative spondylosis elsewhere within the lumbar spine as above. Electronically Signed   By: Jeannine Boga M.D.   On: 12/20/2020 05:37   MR Lumbar Spine W Wo Contrast  Result Date: 12/20/2020 CLINICAL DATA:  Initial evaluation for intermittent left-sided chest pain, history of metastatic prostate cancer. EXAM: MRI CERVICAL, THORACIC AND LUMBAR SPINE WITHOUT AND WITH CONTRAST TECHNIQUE: Multiplanar and multiecho pulse sequences of the cervical spine, to include the craniocervical junction and  cervicothoracic junction, and thoracic and lumbar spine, were obtained without and with intravenous contrast. CONTRAST:  65m GADAVIST GADOBUTROL 1 MMOL/ML IV SOLN COMPARISON:  Prior MRIs from 03/04/2020. FINDINGS: MRI CERVICAL SPINE FINDINGS Alignment: Mild straightening of the normal cervical lordosis. No listhesis. Vertebrae: Diffuse infiltrative low marrow signal intensity again seen throughout the visualized skull base and spine. Previously seen superimposed enhancing lesions are markedly improved as compared to previous exam, compatible with interval response to therapy. Appearance is most evident at the posterior arch of C2 where only a small focus of enhancement is now seen on today's exam, previously fairly prominent on prior MRI. No definite new or progressive lesions. Vertebral body height maintained without pathologic or interval fracture. Previously question extra osseous tumor on the right at C5-6 is not convincingly seen on today's exam. No other new extra osseous extension. No epidural or intracanalicular involvement identified. Cord: Normal signal and morphology. No abnormal enhancement or intracanalicular involvement of tumor. Posterior Fossa, vertebral arteries, paraspinal tissues: Empty sella partially visualized. Visualized portions of the brain and posterior fossa otherwise unremarkable. Paraspinous and prevertebral soft tissues within normal limits. Normal flow voids seen within the vertebral arteries bilaterally. Disc levels: C2-C3: Negative interspace. Mild left-sided facet hypertrophy. No stenosis. C3-C4: Broad posterior disc osteophyte flattens the ventral thecal sac with resultant mild-to-moderate spinal stenosis. Superimposed uncovertebral and facet hypertrophy with resultant moderate left with mild right C4 foraminal stenosis. C4-C5: Mild disc bulge with uncovertebral spurring. Bilateral facet hypertrophy. No significant spinal stenosis. Moderate right with mild left C5 foraminal  narrowing. C5-C6: Degenerative intervertebral disc space narrowing with diffuse disc osteophyte complex. Flattening and partial effacement of the ventral thecal sac, eccentric to the right. Mild spinal stenosis. Severe right worse than left C6 foraminal narrowing. C6-C7: Disc bulge with uncovertebral spurring. Superimposed left paracentral disc protrusion indents the left ventral thecal sac. Mild spinal stenosis. Mild to moderate left with mild right C7 foraminal narrowing. C7-T1:  Normal interspace.  No canal or foraminal stenosis. MRI THORACIC SPINE FINDINGS Alignment: Physiologic with preservation of the normal thoracic kyphosis. No listhesis. Vertebrae: Diffusely abnormal appearance of the visualized osseous structures, consistent with widespread osseous metastatic disease. Overall appearance is markedly improved as compared to previous exam, with decreased heterogeneous enhancement and STIR signal abnormality seen throughout the thoracic spine. Previously seen trace epidural tumor posterior to the T12 vertebral body is no longer visualized. No new extra osseous or epidural tumor identified. Vertebral body height maintained without pathologic or interval fracture. Cord: Normal signal and morphology. No epidural or intracanalicular tumor. No abnormal enhancement. Paraspinal and other soft tissues: Paraspinous soft tissues within normal limits. Splenomegaly partially visualized. Disc levels: No new significant disc pathology. No significant stenosis or neural impingement. MRI LUMBAR SPINE FINDINGS Segmentation: Standard. Lowest well-formed disc space labeled the L5-S1 level. Alignment: Physiologic with preservation of the normal  lumbar lordosis. No listhesis. Vertebrae: Widespread osseous metastatic disease again seen throughout the visualized lumbar spine and pelvis. Overall, appearance is markedly improved with decreased heterogeneous enhancement seen throughout the affected structures as compared to prior.  Vertebral body height maintained without interval or pathologic fracture. Previously seen epidural tumor posterior to the S1 vertebral body has largely resolved, and is no longer seen. Similarly, previously seen extraosseous extension from the bilateral ilium is also no longer clearly visualized. No significant extraosseous extension now seen. No significant epidural or intracanalicular involvement. Conus medullaris and cauda equina: Conus extends to the T12 level. Conus and cauda equina appear normal. Paraspinal and other soft tissues: Interval resolution in previously seen bulky retroperitoneal adenopathy. Disc levels: L1-2:  Unremarkable. L2-3:  Negative interspace.  Mild facet hypertrophy.  No stenosis. L3-4: Disc bulge with mild facet hypertrophy. No spinal stenosis. Mild left L3 foraminal narrowing. L4-5: Degenerative intervertebral disc space narrowing with diffuse disc bulge and reactive endplate spurring. Mild to moderate facet hypertrophy. Resultant mild canal with moderate bilateral lateral recess stenosis. Moderate right with mild left L4 foraminal narrowing. L5-S1: Degenerative intervertebral disc space narrowing with diffuse disc bulge and reactive endplate spurring. Superimposed left subarticular disc protrusion contacts and mildly displaces the descending left S1 nerve root. Mild-to-moderate facet hypertrophy. Resultant moderate left with mild right lateral recess stenosis. Mild bilateral L5 foraminal narrowing. IMPRESSION: MRI CERVICAL SPINE IMPRESSION: 1. Interval improvement in appearance of widespread osseous metastatic disease, consistent with interval response to therapy. No pathologic fracture. Previously seen extraosseous/epidural tumor at C5-6 is no longer clearly visualized, with no extraosseous or epidural tumor now seen. 2. Underlying multilevel cervical spondylosis with resultant mild-to-moderate spinal stenosis at C3-4 through C6-7 as above. Moderate left C4 and right C5 foraminal  stenosis, with severe bilateral C6 foraminal narrowing. MRI THORACIC SPINE IMPRESSION: Marked interval improvement in appearance of widespread osseous metastatic disease, consistent with interval response to therapy. No pathologic fracture. No extra osseous or epidural tumor identified. MRI LUMBAR SPINE IMPRESSION: 1. Interval improvement in widespread osseous metastatic disease involving the lumbar spine and visualized pelvis. No pathologic fracture. 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 No significant epidural or extraosseous disease now seen. 2. Interval improvement/resolution in previously seen retroperitoneal adenopathy. 3. Left subarticular disc protrusion at L5-S1, potentially affecting the left S1 nerve root. Additional more mild degenerative spondylosis elsewhere within the lumbar spine as above. Electronically Signed   By: Jeannine Boga M.D.   On: 12/20/2020 05:37   CT ABDOMEN PELVIS W CONTRAST  Addendum Date: 12/10/2020   ADDENDUM REPORT: 12/10/2020 14:58 ADDENDUM: Of note, the hepatic mass could be further evaluated with a multiphase MR abdomen without and with contrast. Electronically Signed   By: Zerita Boers M.D.   On: 12/10/2020 14:58   Result Date: 12/10/2020 CLINICAL DATA:  Right lower quadrant pain. The patient has a history of hepatitis C cirrhosis and prostate cancer. EXAM: CT ABDOMEN AND PELVIS WITH CONTRAST TECHNIQUE: Multidetector CT imaging of the abdomen and pelvis was performed using the standard protocol following bolus administration of intravenous contrast. CONTRAST:  56m OMNIPAQUE IOHEXOL 350 MG/ML SOLN COMPARISON:  CT abdomen pelvis dated 03/03/2020. FINDINGS: Lower chest: Mild bilateral dependent atelectasis. Hepatobiliary: An enhancing mass in the right hepatic lobe measures 9.1 x 9.2 x 9.2 cm and appears to demonstrate washout enhancement on delayed phase  imaging. This was not seen on prior exam. No gallstones, gallbladder  wall thickening, or biliary dilatation. Pancreas: Calcifications in the head of the pancreas are unchanged and likely reflect chronic pancreatitis. No pancreatic ductal dilatation or surrounding inflammatory changes. Spleen: Normal in size without focal abnormality. Adrenals/Urinary Tract: Adrenal glands are unremarkable. Kidneys are normal, without renal calculi, focal lesion, or hydronephrosis. The urinary bladder wall appears thickened for the degree of distension. Stomach/Bowel: Stomach is within normal limits. Appendix appears normal. No evidence of bowel wall thickening, distention, or inflammatory changes. Vascular/Lymphatic: Aortic atherosclerosis. A lymph node to the left of the inferior vena cava near the porta hepatis measures 1.2 cm in short axis (series 3, image 24 and may be slightly enlarged compared to 03/03/2020. No enlarged pelvic lymph nodes. Reproductive: Prostate is unremarkable. Other: There is a small amount of peritoneal fluid near the hepatic mass extending along the right pericolic gutter. No abdominal wall hernia or abnormality. Musculoskeletal: Numerous sclerotic lesions throughout the spine and pelvis appear progressed since prior exam. Degenerative changes are seen in the spine. Previously seen metastatic disease involving the ventral epidural space at the level of S1 is not appreciated on today's exam. IMPRESSION: 1. Enhancing mass in the right hepatic lobe is concerning for hepatocellular carcinoma given the apparent washout on delayed phase and reported history of hepatitis C cirrhosis. Metastatic disease from the patient's reported prostate cancer is considered less likely. 2. Urinary bladder wall thickening for the degree of distension may reflect cystitis or chronic bladder outlet obstruction. 3. Numerous sclerotic lesions throughout the spine and pelvis appear progressed since 03/03/2020, likely representing  metastatic disease. Electronically Signed: By: Zerita Boers M.D. On: 12/10/2020 14:53   ECHOCARDIOGRAM COMPLETE  Result Date: 12/19/2020    ECHOCARDIOGRAM REPORT   Patient Name:   CASSEL JARECKI [place]ten Date of Exam: 12/19/2020 Medical Rec #:  [id number]     Height:       62.0 in Accession #:    [id number]    Weight:       160.0 lb Date of Birth:  1965-06-17     BSA:          1.739 m Patient Age:    54 years      BP:           160/79 mmHg Patient Gender: M             HR:           57 bpm. Exam Location:  Inpatient Procedure: 2D Echo Indications:    chest pain  History:        Patient has no prior history of Echocardiogram examinations.                 Risk Factors:Hypertension and polysubstance abuse.  Sonographer:    Johny Chess RDCS Referring Phys: [id number] [place]  1. Left ventricular ejection fraction, by estimation, is 50 to 55%. The left ventricle has low normal function. The left ventricle demonstrates regional wall motion abnormalities with basal to mid inferior hypokinesis. Left ventricular diastolic parameters were normal.  2. Right ventricular systolic function is normal. The right ventricular size is normal. Tricuspid regurgitation signal is inadequate for assessing PA pressure.  3. Left atrial size was mild to moderately dilated.  4. The mitral valve is normal in structure. No evidence of mitral valve regurgitation. No evidence of mitral stenosis.  5. The aortic valve is tricuspid. Aortic valve regurgitation is not visualized. No aortic stenosis is present.  6. The inferior vena cava is normal in size with  greater than 50% respiratory variability, suggesting right atrial pressure of 3 mmHg. FINDINGS  Left Ventricle: Left ventricular ejection fraction, by estimation, is 50 to 55%. The left ventricle has low normal function. The left ventricle demonstrates regional wall motion abnormalities. The left ventricular internal cavity size was normal in size. There is no left ventricular  hypertrophy. Left ventricular diastolic parameters were normal. Right Ventricle: The right ventricular size is normal. No increase in right ventricular wall thickness. Right ventricular systolic function is normal. Tricuspid regurgitation signal is inadequate for assessing PA pressure. Left Atrium: Left atrial size was mild to moderately dilated. Right Atrium: Right atrial size was normal in size. Pericardium: There is no evidence of pericardial effusion. Mitral Valve: The mitral valve is normal in structure. No evidence of mitral valve regurgitation. No evidence of mitral valve stenosis. Tricuspid Valve: The tricuspid valve is normal in structure. Tricuspid valve regurgitation is not demonstrated. Aortic Valve: The aortic valve is tricuspid. Aortic valve regurgitation is not visualized. No aortic stenosis is present. Pulmonic Valve: The pulmonic valve was normal in structure. Pulmonic valve regurgitation is not visualized. Aorta: The aortic root is normal in size and structure. Venous: The inferior vena cava is normal in size with greater than 50% respiratory variability, suggesting right atrial pressure of 3 mmHg. IAS/Shunts: No atrial level shunt detected by color flow Doppler.  LEFT VENTRICLE PLAX 2D LVIDd:         5.10 cm  Diastology LVIDs:         3.20 cm  LV e' medial:    8.92 cm/s LV PW:         0.90 cm  LV E/e' medial:  11.0 LV IVS:        0.80 cm  LV e' lateral:   11.20 cm/s LVOT diam:     2.10 cm  LV E/e' lateral: 8.8 LV SV:         68 LV SV Index:   39 LVOT Area:     3.46 cm  RIGHT VENTRICLE             IVC RV S prime:     15.40 cm/s  IVC diam: 1.90 cm TAPSE (M-mode): 2.4 cm LEFT ATRIUM             Index       RIGHT ATRIUM           Index LA diam:        3.80 cm 2.19 cm/m  RA Area:     11.90 cm LA Vol (A2C):   75.9 ml 43.65 ml/m RA Volume:   26.70 ml  15.36 ml/m LA Vol (A4C):   69.1 ml 39.74 ml/m LA Biplane Vol: 75.3 ml 43.31 ml/m  AORTIC VALVE LVOT Vmax:   101.00 cm/s LVOT Vmean:  61.800 cm/s  LVOT VTI:    0.197 m  AORTA Ao Root diam: 3.00 cm Ao Asc diam:  3.30 cm MITRAL VALVE MV Area (PHT): 3.00 cm    SHUNTS MV Decel Time: 253 msec    Systemic VTI:  0.20 m MV E velocity: 98.50 cm/s  Systemic Diam: 2.10 cm MV A velocity: 62.90 cm/s MV E/A ratio:  1.57 Dalton McleanMD Electronically signed by Franki Monte Signature Date/Time: 12/19/2020/5:32:35 PM    Final     ASSESSMENT AND PLAN: 1. Metastatic prostate cancer - Lytic bone lesions with retroperitoneal adenopathy concerning for metastatic prostate cancer -03/03/2020-CTA chest/abdomen/pelvis widespread osseous metastatic disease and lower retroperitoneal adenopathy, pathologic right anterior third  rib fracture, probable spinal canal tumor at the level of S1, -MRI cervical, thoracic, and lumbar spine 03/03/2020-diffuse osseous metastatic disease, ventral epidural tumor impinging on right S1 nerve root, extraosseous tumor at the bilateral ilium, asymmetric enhancing material at the right C5-6 canal-right  -03/03/2020-PSA 763 -Degarelix 03/04/2020 -Abiraterone/prednisone 03/14/2020 -Every 82-monthLupron 04/01/2020 -04/01/2020 PSA 36.5 -05/31/2020 PSA 1.2 -06/28/2020 PSA 1.0 2.  Severe anemia-likely secondary to metastatic prostate cancer involving the bones 3.  Mild thrombocytopenia 4.  Cirrhosis with splenomegaly 5.  History of hepatitis C 6.  History of polysubstance abuse 7.  Depression 8.  Tobacco dependence 9.  Right arm weakness, right facial numbness-potentially related to nerve root compromise from metastatic bone lesions 10.  Pain secondary to #1 11.  Extensive bone metastases-every 349-monthometa starting 04/01/2020 12.  Gout-acute flare right wrist 04/01/2020 treated with indomethacin, allopurinol resumed 13.  Right hepatic lobe mass with elevated AFP concerning for [facility] LtdMr. MaManlys currently under treatment for metastatic prostate cancer.  He has been giving Zytiga and Lupron and tolerating well.  His PSA has been well  controlled.  MRI of his spine was performed earlier today which showed improvement in his metastatic disease.  Continue Zytiga.  He was recently found to have a right liver mass.  His AFP was significantly elevated which is concerning for [facility] An MRI of the liver has been ordered.  He missed this appointment due to hospitalization.  His MRI has been reordered.  Imaging findings are consistent with [facility] will not need a biopsy of his liver mass.  From our standpoint, there is no contraindication to proceeding with anticoagulation if needed from a cardiology standpoint.  We would recommend cardiology work-up/treatment as recommended.  Recommendations: 1.  Continue Zytiga and prednisone 2.  MRI of the liver for further evaluation of his right liver mass 3.  Continue current pain medication 4.  Okay to proceed with anticoagulation if needed    LOS: 0 days   KrMikey BussingDNP, AGPCNP-BC, AOCNP 12/20/20   Mr. MaMonties well-known to me with a history metastatic prostate cancer, currently maintained on abiraterone/prednisone and Lupron.  There has been marked clinical improvement since beginning hormonal therapy for prostate cancer.  He is now admitted with chest pain and a possible NSTEMI.  He is scheduled for cardiac catheterization tomorrow.  He was recently found to have a liver mass with a markedly elevated AFP.  He is scheduled for an MRI of the liver.  If this confirms changes of hepatocellular carcinoma he will not need a liver biopsy.  He will most likely be treated with hepatic embolization therapy.  I see no contraindication for anticoagulation with regard to the history of prostate cancer.  However, he may be at increased risk for bleeding in the setting of cirrhosis.  I was present for greater than 50% of today's visit.  I performed medical stage making.  BrJulieanne MansonMD

## 2020-12-20 NOTE — Progress Notes (Signed)
EKG done. Placed in chart.

## 2020-12-20 NOTE — Progress Notes (Signed)
ANTICOAGULATION CONSULT NOTE - Initial Consult  Pharmacy Consult for Heparin Indication: chest pain/ACS  No Known Allergies  Patient Measurements: Height: '5\' 2"'$  (157.5 cm) Weight: 74.8 kg (164 lb 14.4 oz) IBW/kg (Calculated) : 54.6  Heparin Dosing Weight: 70.2 kg  Vital Signs: Temp: 98.4 F (36.9 C) (09/06 0725) Temp Source: Oral (09/06 0725) BP: 138/88 (09/06 0725) Pulse Rate: 57 (09/06 0725)  Labs: Recent Labs    12/19/20 1158 12/19/20 1428  HGB 12.6*  --   HCT 38.2*  --   PLT 175  --   CREATININE 0.71  --   TROPONINIHS 46* 97*    Estimated Creatinine Clearance: 92.5 mL/min (by C-G formula based on SCr of 0.71 mg/dL).   Medical History: Past Medical History:  Diagnosis Date   Alcohol abuse    Cocaine abuse ([place])    Depression    Gout    Hep C w/o coma, chronic ([place]) diagnosed May 2016    Medications:  Scheduled:   abiraterone acetate  1,000 mg Oral Daily   aspirin EC  81 mg Oral Daily   isosorbide mononitrate  60 mg Oral Daily   predniSONE  5 mg Oral Q breakfast   sodium chloride flush  3 mL Intravenous Q12H   tamsulosin  0.4 mg Oral Daily   Infusions:   sodium chloride      Assessment: 55 y.o. male presented on 9/05 for ongoing chest pain. Initially, patient was not started on heparin due to previous imaging with concerns of epidural space mass. Per Cardiology recommendations, OK to initiate IV heparin for ACS which will also allow ability to assess tolerance to anticoagulants. Possible LHC 9/07.  H/h stable at 12.6/38.2 with no s/s bleeding per chart review. Discussed with Cardiology, will not give bolus. Will initiate heparin @ 850 units/hour. Check heparin levels in 6 hours.    Goal of Therapy:  Heparin level 0.3-0.7 units/ml Monitor platelets by anticoagulation protocol: Yes   Plan:  Start heparin infusion at 850 units/hr Check heparin level in 6 hours and daily while on heparin Continue to monitor H&H and platelets   Thank you for  allowing pharmacy to be a part of this patient's care.  Ardyth Harps, PharmD Clinical Pharmacist

## 2020-12-20 NOTE — Progress Notes (Addendum)
Discussed case with Dr. Benay Spice who will be seeing patient in consult today. From his perspective with regard to the prior concern for anticoagulation and epidural mets, he does not see contraindication to using heparin based on this. He does raise question of whether patient would be at risk for varices given cirrhosis on imaging. He will see the patient in consult later today and likely pursue liver MRI as inpatient. Per d/w Dr. Stanford Breed, will initiate IV heparin which will also allow ability to assess tolerance to blood thinners. Will keep NPO after midnight to reconsider cath tomorrow based on clinical data. Per d/w Dr. Stanford Breed, hold off statin given pending liver w/u.   Addendum: MRI still pending at this time -  Msg sent to tomorrow's rounding team to see early to finalize cath plans.

## 2020-12-21 ENCOUNTER — Encounter (HOSPITAL_COMMUNITY)
Admission: EM | Disposition: A | Discharge status: Home / Self Care | Payer: Self-pay | Source: Home / Self Care | Attending: Internal Medicine

## 2020-12-21 ENCOUNTER — Other Ambulatory Visit (HOSPITAL_COMMUNITY): Payer: Self-pay

## 2020-12-21 ENCOUNTER — Encounter (HOSPITAL_COMMUNITY): Payer: Self-pay | Admitting: Cardiovascular Disease

## 2020-12-21 ENCOUNTER — Inpatient Hospital Stay (HOSPITAL_COMMUNITY): Payer: BC Managed Care – PPO

## 2020-12-21 ENCOUNTER — Inpatient Hospital Stay: Payer: BC Managed Care – PPO | Admitting: Nurse Practitioner

## 2020-12-21 DIAGNOSIS — I25110 Atherosclerotic heart disease of native coronary artery with unstable angina pectoris: Secondary | ICD-10-CM

## 2020-12-21 HISTORY — PX: LEFT HEART CATH AND CORONARY ANGIOGRAPHY: CATH118249

## 2020-12-21 HISTORY — PX: CORONARY STENT INTERVENTION: CATH118234

## 2020-12-21 LAB — COMPREHENSIVE METABOLIC PANEL
ALT: 35 U/L (ref 0–44)
AST: 34 U/L (ref 15–41)
Albumin: 3.3 g/dL — ABNORMAL LOW (ref 3.5–5.0)
Alkaline Phosphatase: 43 U/L (ref 38–126)
Anion gap: 9 (ref 5–15)
BUN: 26 mg/dL — ABNORMAL HIGH (ref 6–20)
CO2: 21 mmol/L — ABNORMAL LOW (ref 22–32)
Calcium: 9.3 mg/dL (ref 8.9–10.3)
Chloride: 106 mmol/L (ref 98–111)
Creatinine, Ser: 0.91 mg/dL (ref 0.61–1.24)
GFR, Estimated: >60 mL/min (ref >60)
Glucose, Bld: 110 mg/dL — ABNORMAL HIGH (ref 70–99)
Potassium: 4.1 mmol/L (ref 3.5–5.1)
Sodium: 136 mmol/L (ref 135–145)
Total Bilirubin: 0.5 mg/dL (ref 0.3–1.2)
Total Protein: 5.9 g/dL — ABNORMAL LOW (ref 6.5–8.1)

## 2020-12-21 LAB — CBC
HCT: 33.9 % — ABNORMAL LOW (ref 39.0–52.0)
Hemoglobin: 11.8 g/dL — ABNORMAL LOW (ref 13.0–17.0)
MCH: 32.1 pg (ref 26.0–34.0)
MCHC: 34.8 g/dL (ref 30.0–36.0)
MCV: 92.1 fL (ref 80.0–100.0)
Platelets: 169 10*3/uL (ref 150–400)
RBC: 3.68 MIL/uL — ABNORMAL LOW (ref 4.22–5.81)
RDW: 12.2 % (ref 11.5–15.5)
WBC: 7.1 10*3/uL (ref 4.0–10.5)
nRBC: 0 % (ref 0.0–0.2)

## 2020-12-21 LAB — HEPARIN LEVEL (UNFRACTIONATED)
Heparin Unfractionated: <0.1 IU/mL — ABNORMAL LOW (ref 0.30–0.70)
Heparin Unfractionated: 0.12 IU/mL — ABNORMAL LOW (ref 0.30–0.70)

## 2020-12-21 LAB — POCT ACTIVATED CLOTTING TIME
Activated Clotting Time: 283 seconds
Activated Clotting Time: 289 seconds
Activated Clotting Time: 254 seconds

## 2020-12-21 LAB — TROPONIN I (HIGH SENSITIVITY)
Troponin I (High Sensitivity): 28 ng/L — ABNORMAL HIGH (ref <18)
Troponin I (High Sensitivity): 48 ng/L — ABNORMAL HIGH (ref <18)

## 2020-12-21 LAB — GLUCOSE, CAPILLARY: Glucose-Capillary: 137 mg/dL — ABNORMAL HIGH (ref 70–99)

## 2020-12-21 IMAGING — MR MR ABDOMEN WO/W CM
21 of 22 series · 47 of 48 positions shown · IV contrast (gadavist)
Comparison: CT abdomen pelvis, [DATE]

CLINICAL DATA: Characterize liver mass

EXAM:
MRI ABDOMEN WITHOUT AND WITH CONTRAST
TECHNIQUE: Multiplanar multisequence MR imaging of the abdomen was performed
both before and after the administration of intravenous contrast.
CONTRAST:  7mL GADAVIST GADOBUTROL 1 MMOL/ML IV SOLN

[Series 4: cor haste · coronal · 6.0mm · 1.25mm/px · 1 of 40 slices shown]
[im 1/40]
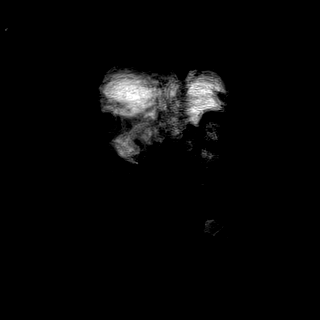

[Series 5: ax haste · axial · 6.0mm · 1.25mm/px · 1 of 40 slices shown]
[im 1/40]
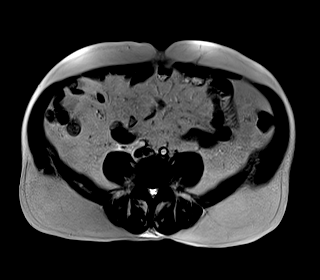

[Series 7: T2 fat-sat · axial · 6.0mm · 1.31mm/px · 1 of 38 slices shown]
[im 1/38]
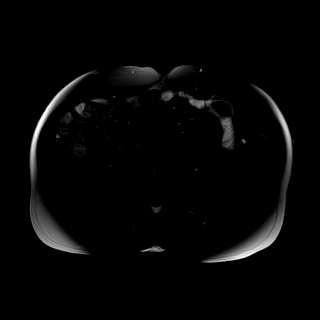

[Series 9: t1_vibe_opp-in_tra_p4_bh · axial · 3.0mm · 1.31mm/px · z∈[-98,+187]mm · 2 of 96 slices shown (1 of 2)]
[im 1/96]
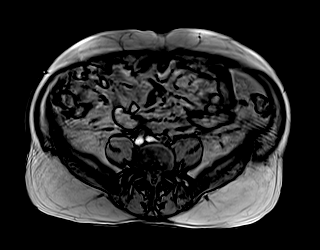
[im 96/96]
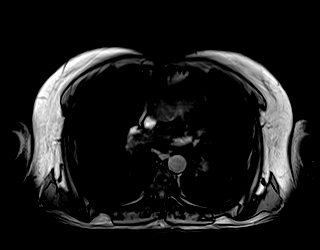

[Series 9: t1_vibe_opp-in_tra_p4_bh · axial · 3.0mm · 1.31mm/px · z∈[-98,+187]mm · 2 of 96 slices shown (2 of 2)]
[im 1/96]
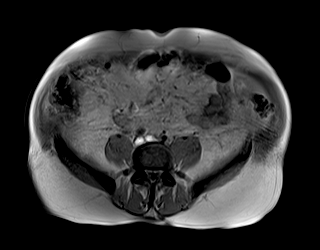
[im 96/96]
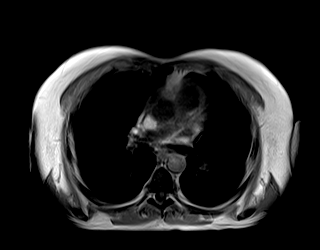

[Series 10: DWI · axial · 6.0mm · 1.57mm/px · z∈[-90,+191]mm · 2 of 120 slices shown (1 of 2)]
[im 1/120]
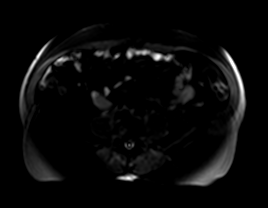
[im 120/120]
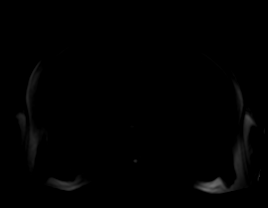

[Series 11: DWI · axial · 6.0mm · 1.57mm/px · 1 of 40 slices shown (2 of 2)]
[im 1/40]
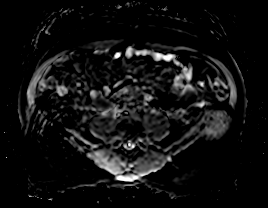

[Series 12: bSSFP · axial · 6.0mm · 0.82mm/px · 1 of 38 slices shown]
[im 1/38]
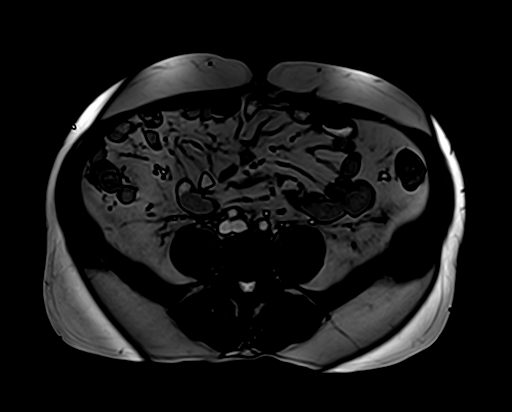

[Series 13: t1_vibe_fs_tra_p4_bh_pre · axial · 3.0mm · 1.31mm/px · z∈[-98,+187]mm · 2 of 96 slices shown (1 of 2)]
[im 1/96]
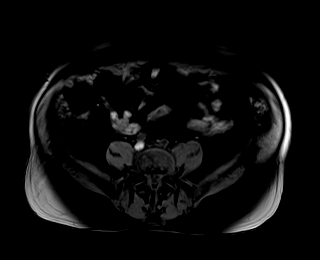
[im 96/96]
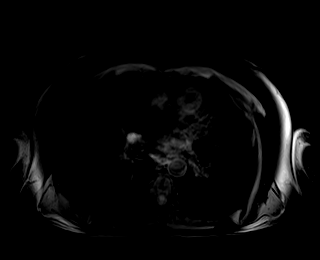

[Series 14: t1_vibe_fs_tra_p4_bh_pre · axial · 3.0mm · 1.31mm/px · z∈[-98,+187]mm · 2 of 96 slices shown (2 of 2)]
[im 1/96]
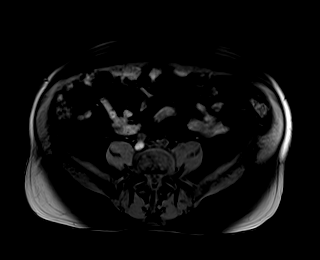
[im 96/96]
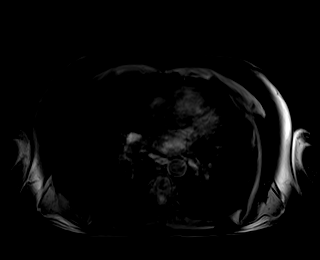

[Series 15: t1_vibe_fs_tra_p4_bh_pre_sub · axial · 3.0mm · 1.31mm/px · z∈[-98,+187]mm · 2 of 96 slices shown]
[im 1/96]
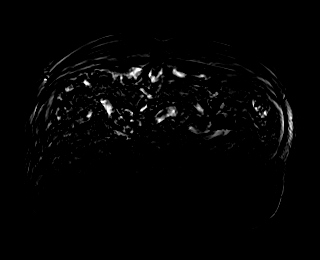
[im 96/96]
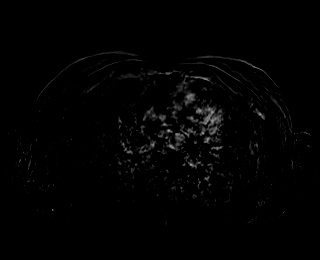

[Series 17: t1_vibe_fs_tra_p4_bh_post · axial · 3.0mm · 1.31mm/px · z∈[-98,+187]mm · 3 of 96 slices shown (1 of 4)]
[im 1/96]
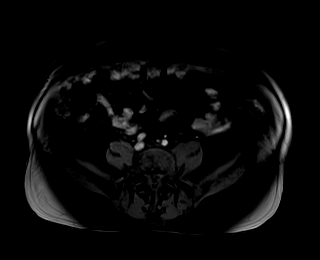
[im 48/96]
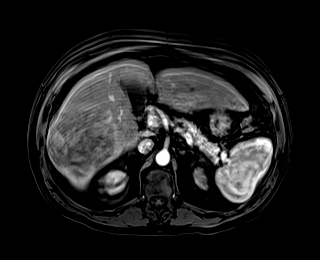
[im 96/96]
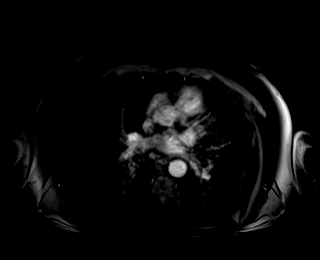

[Series 18: t1_vibe_fs_tra_p4_bh_post_sub · axial · 3.0mm · 1.31mm/px · z∈[-98,+187]mm · 3 of 96 slices shown (1 of 4)]
[im 1/96]
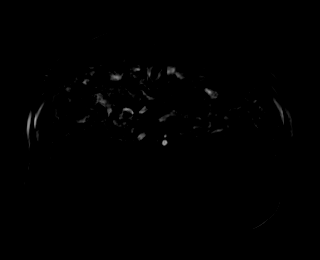
[im 48/96]
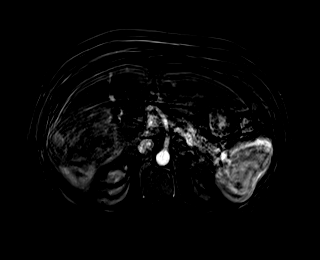
[im 96/96]
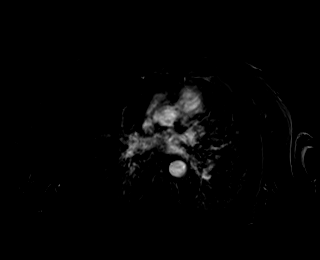

[Series 19: t1_vibe_fs_tra_p4_bh_post · axial · 3.0mm · 1.31mm/px · z∈[-98,+187]mm · 3 of 96 slices shown (2 of 4)]
[im 1/96]
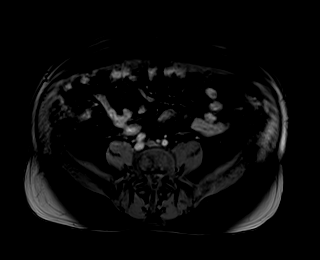
[im 48/96]
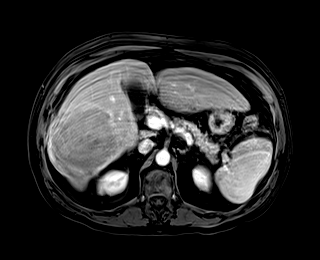
[im 96/96]
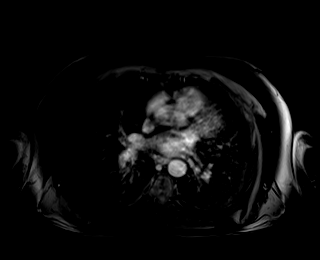

[Series 20: t1_vibe_fs_tra_p4_bh_post_sub · axial · 3.0mm · 1.31mm/px · z∈[-98,+187]mm · 3 of 96 slices shown (2 of 4)]
[im 1/96]
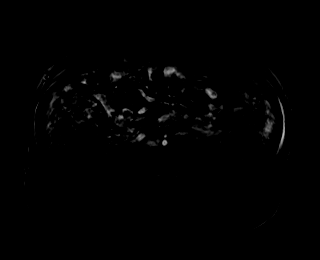
[im 48/96]
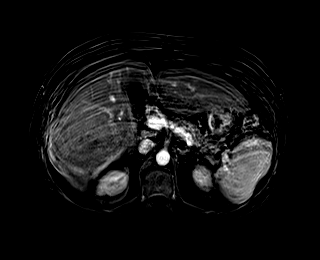
[im 96/96]
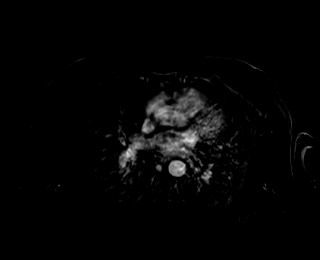

[Series 21: t1_vibe_fs_tra_p4_bh_post · axial · 3.0mm · 1.31mm/px · z∈[-98,+187]mm · 3 of 96 slices shown (3 of 4)]
[im 1/96]
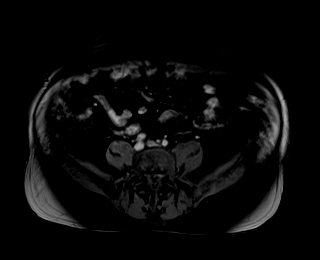
[im 48/96]
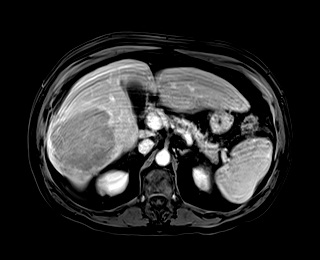
[im 96/96]
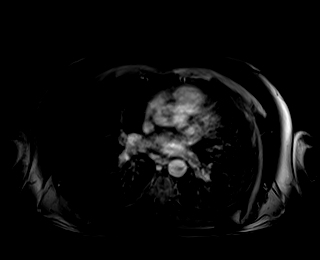

[Series 22: t1_vibe_fs_tra_p4_bh_post_sub · axial · 3.0mm · 1.31mm/px · z∈[-98,+187]mm · 3 of 96 slices shown (3 of 4)]
[im 1/96]
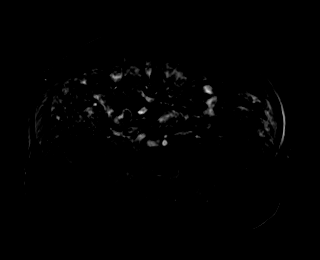
[im 48/96]
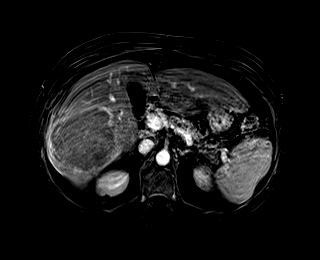
[im 96/96]
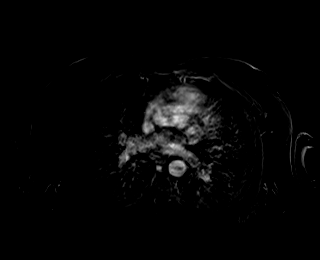

[Series 23: t1_vibe_fs_tra_p4_bh_post · axial · 3.0mm · 1.31mm/px · z∈[-98,+187]mm · 3 of 96 slices shown (4 of 4)]
[im 1/96]
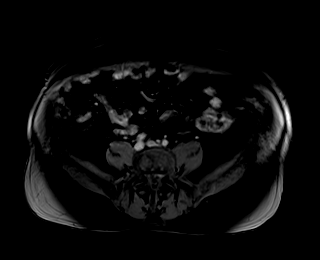
[im 48/96]
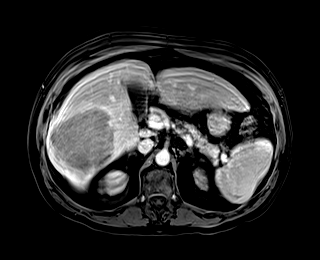
[im 96/96]
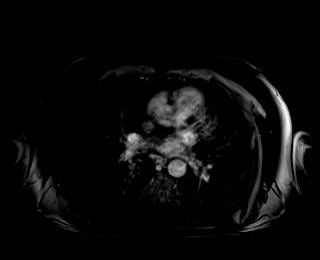

[Series 24: t1_vibe_fs_tra_p4_bh_post_sub · axial · 3.0mm · 1.31mm/px · z∈[-98,+187]mm · 3 of 96 slices shown (4 of 4)]
[im 1/96]
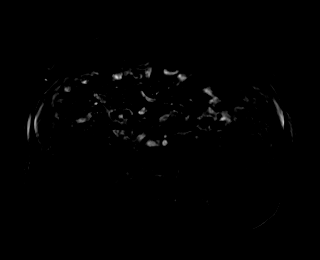
[im 48/96]
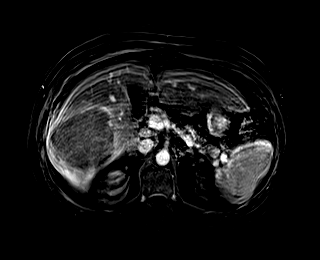
[im 96/96]
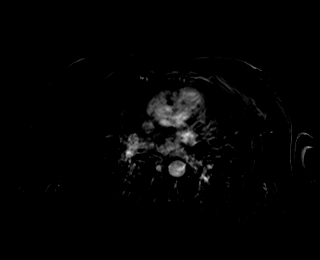

[Series 25: T1 dynamic post-contrast · coronal · 3.0mm · 1.64mm/px · 3 of 96 slices shown (1 of 2)]
[im 1/96]
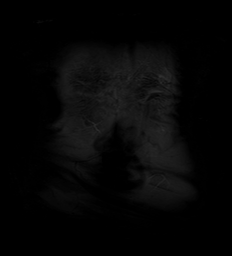
[im 48/96]
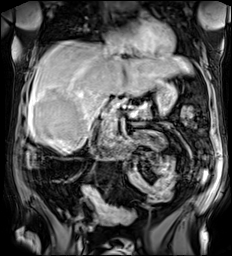
[im 96/96]
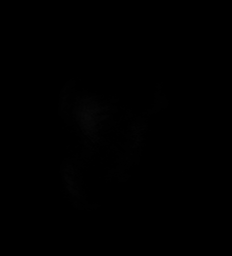

[Series 26: T1 dynamic post-contrast · coronal · 3.0mm · 1.72mm/px · 3 of 96 slices shown (2 of 2)]
[im 1/96]
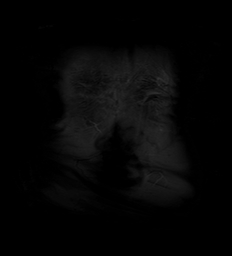
[im 48/96]
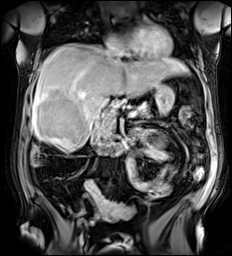
[im 96/96]
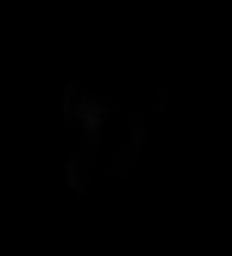

[47 of 48 positions shown; findings below may reference images not displayed]

FINDINGS: Lower chest: No acute findings.

Hepatobiliary: There is a large, contrast hypoenhancing mass of the
inferior right lobe of the liver, hepatic segment VI, measuring
x 8.7 cm (series 21, image 50). This appears to occlude or at least
compress the adjacent branch right portal veins, without definite
evidence of tumor thrombus. Numerous tiny gallstones in the
dependent gallbladder. No gallbladder wall thickening. No biliary
ductal dilatation.

Pancreas: No mass, inflammatory changes, or other parenchymal
abnormality identified. No pancreatic ductal dilatation.

Spleen:  Within normal limits in size and appearance.

Adrenals/Urinary Tract: No masses identified. No evidence of
hydronephrosis.

Stomach/Bowel: Visualized portions within the abdomen are
unremarkable.

Vascular/Lymphatic: No pathologically enlarged lymph nodes
identified. No abdominal aortic aneurysm demonstrated.

Other:  None.

Musculoskeletal: Diffusely heterogeneous, contrast enhancing
vertebral bone marrow.
IMPRESSION: 1. There is a large, contrast hypoenhancing mass of the inferior
right lobe of the liver, hepatic segment VI, measuring 9.4 x 8.7 cm,
which appears to occlude or at least compress the adjacent branch
right portal veins, without definite evidence of tumor thrombus.
Appearance and contrast enhancement characteristics favor
intrahepatic cholangiocarcinoma, although hepatocellular carcinoma
is a differential consideration, particularly in the high risk
setting of chronic hepatitis C. Prostate metastatic disease is not
favored. THAGOPI category M.

2. Diffusely heterogeneous, contrast enhancing vertebral bone
marrow, in keeping with patient's known metastatic prostate
malignancy.

3. No lymphadenopathy or evidence of organ metastatic disease in the
abdomen.

4.  Cholelithiasis without evidence of acute cholecystitis.

## 2020-12-21 SURGERY — LEFT HEART CATH AND CORONARY ANGIOGRAPHY
Anesthesia: LOCAL

## 2020-12-21 MED ORDER — LIDOCAINE HCL (PF) 1 % IJ SOLN
INTRAMUSCULAR | Status: DC | PRN
  Administered 2020-12-21: 2 mL

## 2020-12-21 MED ORDER — ASPIRIN 81 MG PO CHEW: 81.0000 mg | CHEWABLE_TABLET | Freq: Every day | ORAL | Status: DC

## 2020-12-21 MED ORDER — NITROGLYCERIN IN D5W 200-5 MCG/ML-% IV SOLN
INTRAVENOUS | Status: AC
  Filled 2020-12-21: qty 250

## 2020-12-21 MED ORDER — TICAGRELOR 90 MG PO TABS
90.0000 mg | ORAL_TABLET | Freq: Two times a day (BID) | ORAL | Status: DC
  Administered 2020-12-21 – 2020-12-23 (×4): 90 mg via ORAL
  Filled 2020-12-21 (×3): qty 1

## 2020-12-21 MED ORDER — SODIUM CHLORIDE 0.9 % IV SOLN: INTRAVENOUS | Status: DC

## 2020-12-21 MED ORDER — LIDOCAINE HCL (PF) 1 % IJ SOLN
INTRAMUSCULAR | Status: AC
  Filled 2020-12-21: qty 30

## 2020-12-21 MED ORDER — MIDAZOLAM HCL 2 MG/2ML IJ SOLN
INTRAMUSCULAR | Status: AC
  Filled 2020-12-21: qty 2

## 2020-12-21 MED ORDER — LABETALOL HCL 5 MG/ML IV SOLN: 10.0000 mg | INTRAVENOUS | Status: AC | PRN

## 2020-12-21 MED ORDER — NITROGLYCERIN IN D5W 200-5 MCG/ML-% IV SOLN
INTRAVENOUS | Status: AC | PRN
  Administered 2020-12-21: 10 ug/min via INTRAVENOUS

## 2020-12-21 MED ORDER — SODIUM CHLORIDE 0.9 % IV SOLN
INTRAVENOUS | Status: AC | PRN
  Administered 2020-12-21: 10 mL/h via INTRAVENOUS

## 2020-12-21 MED ORDER — SODIUM CHLORIDE 0.9% FLUSH: 3.0000 mL | INTRAVENOUS | Status: DC | PRN

## 2020-12-21 MED ORDER — MIDAZOLAM HCL 2 MG/2ML IJ SOLN
INTRAMUSCULAR | Status: DC | PRN
  Administered 2020-12-21: 2 mg via INTRAVENOUS
  Administered 2020-12-21 (×2): 1 mg via INTRAVENOUS

## 2020-12-21 MED ORDER — ACETAMINOPHEN 325 MG PO TABS: 650.0000 mg | ORAL_TABLET | ORAL | Status: DC | PRN

## 2020-12-21 MED ORDER — HEPARIN (PORCINE) IN NACL 1000-0.9 UT/500ML-% IV SOLN
INTRAVENOUS | Status: DC | PRN
  Administered 2020-12-21 (×2): 500 mL

## 2020-12-21 MED ORDER — DIAZEPAM 5 MG PO TABS
5.0000 mg | ORAL_TABLET | ORAL | Status: DC | PRN
  Administered 2020-12-22 – 2020-12-23 (×3): 5 mg via ORAL
  Filled 2020-12-21 (×3): qty 1

## 2020-12-21 MED ORDER — IOHEXOL 350 MG/ML SOLN
INTRAVENOUS | Status: DC | PRN
  Administered 2020-12-21: 190 mL

## 2020-12-21 MED ORDER — SODIUM CHLORIDE 0.9 % IV SOLN: 250.0000 mL | INTRAVENOUS | Status: DC | PRN

## 2020-12-21 MED ORDER — NITROGLYCERIN 1 MG/10 ML FOR IR/CATH LAB
INTRA_ARTERIAL | Status: DC | PRN
  Administered 2020-12-21 (×2): 200 ug via INTRACORONARY

## 2020-12-21 MED ORDER — ONDANSETRON HCL 4 MG/2ML IJ SOLN: 4.0000 mg | Freq: Four times a day (QID) | INTRAMUSCULAR | Status: DC | PRN

## 2020-12-21 MED ORDER — TICAGRELOR 90 MG PO TABS
ORAL_TABLET | ORAL | Status: AC
  Filled 2020-12-21: qty 2

## 2020-12-21 MED ORDER — HEPARIN BOLUS VIA INFUSION
2500.0000 [IU] | Freq: Once | INTRAVENOUS | Status: AC
  Administered 2020-12-21: 2500 [IU] via INTRAVENOUS
  Filled 2020-12-21: qty 2500

## 2020-12-21 MED ORDER — FENTANYL CITRATE (PF) 100 MCG/2ML IJ SOLN
INTRAMUSCULAR | Status: AC
  Filled 2020-12-21: qty 2

## 2020-12-21 MED ORDER — HEPARIN (PORCINE) 25000 UT/250ML-% IV SOLN
1750.0000 [IU]/h | INTRAVENOUS | Status: DC
  Administered 2020-12-21: 1550 [IU]/h via INTRAVENOUS
  Filled 2020-12-21: qty 250

## 2020-12-21 MED ORDER — HEPARIN SODIUM (PORCINE) 1000 UNIT/ML IJ SOLN
INTRAMUSCULAR | Status: DC | PRN
  Administered 2020-12-21: 6000 [IU] via INTRAVENOUS
  Administered 2020-12-21 (×2): 2000 [IU] via INTRAVENOUS

## 2020-12-21 MED ORDER — SODIUM CHLORIDE 0.9 % WEIGHT BASED INFUSION: 1.0000 mL/kg/h | INTRAVENOUS | Status: DC

## 2020-12-21 MED ORDER — ASPIRIN 81 MG PO CHEW
81.0000 mg | CHEWABLE_TABLET | ORAL | Status: AC
  Administered 2020-12-22: 81 mg via ORAL
  Filled 2020-12-21: qty 1

## 2020-12-21 MED ORDER — FENTANYL CITRATE (PF) 100 MCG/2ML IJ SOLN
INTRAMUSCULAR | Status: DC | PRN
  Administered 2020-12-21 (×3): 25 ug via INTRAVENOUS

## 2020-12-21 MED ORDER — HEPARIN (PORCINE) IN NACL 1000-0.9 UT/500ML-% IV SOLN
INTRAVENOUS | Status: AC
  Filled 2020-12-21: qty 1000

## 2020-12-21 MED ORDER — SODIUM CHLORIDE 0.9 % WEIGHT BASED INFUSION: 3.0000 mL/kg/h | INTRAVENOUS | Status: DC

## 2020-12-21 MED ORDER — SODIUM CHLORIDE 0.9% FLUSH: 3.0000 mL | Freq: Two times a day (BID) | INTRAVENOUS | Status: DC

## 2020-12-21 MED ORDER — NITROGLYCERIN 1 MG/10 ML FOR IR/CATH LAB
INTRA_ARTERIAL | Status: AC
  Filled 2020-12-21: qty 10

## 2020-12-21 MED ORDER — LORAZEPAM 2 MG/ML IJ SOLN
1.0000 mg | Freq: Once | INTRAMUSCULAR | Status: DC
  Filled 2020-12-21: qty 1

## 2020-12-21 MED ORDER — DIPHENHYDRAMINE HCL 25 MG PO CAPS
50.0000 mg | ORAL_CAPSULE | Freq: Once | ORAL | Status: AC
  Administered 2020-12-21: 50 mg via ORAL
  Filled 2020-12-21: qty 2

## 2020-12-21 MED ORDER — GADOBUTROL 1 MMOL/ML IV SOLN
7.0000 mL | Freq: Once | INTRAVENOUS | Status: AC | PRN
  Administered 2020-12-21: 7 mL via INTRAVENOUS

## 2020-12-21 MED ORDER — VERAPAMIL HCL 2.5 MG/ML IV SOLN
INTRAVENOUS | Status: DC | PRN
  Administered 2020-12-21: 10 mL via INTRA_ARTERIAL

## 2020-12-21 MED ORDER — SODIUM CHLORIDE 0.9 % IV SOLN: INTRAVENOUS | Status: AC

## 2020-12-21 MED ORDER — VERAPAMIL HCL 2.5 MG/ML IV SOLN
INTRAVENOUS | Status: AC
  Filled 2020-12-21: qty 2

## 2020-12-21 MED ORDER — HEPARIN SODIUM (PORCINE) 1000 UNIT/ML IJ SOLN
INTRAMUSCULAR | Status: AC
  Filled 2020-12-21: qty 1

## 2020-12-21 MED ORDER — TICAGRELOR 90 MG PO TABS
ORAL_TABLET | ORAL | Status: DC | PRN
  Administered 2020-12-21: 180 mg via ORAL

## 2020-12-21 MED ORDER — HYDRALAZINE HCL 20 MG/ML IJ SOLN: 10.0000 mg | INTRAMUSCULAR | Status: AC | PRN

## 2020-12-21 MED ORDER — ASPIRIN 81 MG PO CHEW
81.0000 mg | CHEWABLE_TABLET | Freq: Every day | ORAL | Status: DC
  Administered 2020-12-23: 81 mg via ORAL
  Filled 2020-12-21: qty 1

## 2020-12-21 SURGICAL SUPPLY — 20 items
BALLN SAPPHIRE 2.0X12 (BALLOONS) ×2
BALLN SAPPHIRE ~~LOC~~ 3.0X10 (BALLOONS) ×2 IMPLANT
BALLOON SAPPHIRE 2.0X12 (BALLOONS) IMPLANT
CATH INFINITI JR4 5F (CATHETERS) ×1 IMPLANT
CATH LAUNCHER 6FR EBU3.5 (CATHETERS) ×1 IMPLANT
CATH OPTITORQUE TIG 4.0 5F (CATHETERS) ×1 IMPLANT
CATH VISTA GUIDE 6FR XBLAD3.5 (CATHETERS) ×1 IMPLANT
DEVICE RAD COMP TR BAND LRG (VASCULAR PRODUCTS) ×2 IMPLANT
ELECT DEFIB PAD ADLT CADENCE (PAD) ×1 IMPLANT
GLIDESHEATH SLEND SS 6F .021 (SHEATH) ×1 IMPLANT
GUIDEWIRE INQWIRE 1.5J.035X260 (WIRE) IMPLANT
INQWIRE 1.5J .035X260CM (WIRE) ×2
KIT ENCORE 26 ADVANTAGE (KITS) ×1 IMPLANT
KIT HEART LEFT (KITS) ×2 IMPLANT
PACK CARDIAC CATHETERIZATION (CUSTOM PROCEDURE TRAY) ×2 IMPLANT
SHEATH PROBE COVER 6X72 (BAG) ×1 IMPLANT
STENT ONYX FRONTIER 2.75X15 (Permanent Stent) ×2 IMPLANT
TRANSDUCER W/STOPCOCK (MISCELLANEOUS) ×2 IMPLANT
TUBING CIL FLEX 10 FLL-RA (TUBING) ×2 IMPLANT
WIRE COUGAR XT STRL 190CM (WIRE) ×1 IMPLANT

## 2020-12-21 NOTE — H&P (View-Only) (Signed)
Progress Note  Patient Name: Edgar Mooney Date of Encounter: 12/21/2020  [facility] Cardiologist: Dr Johnsie Cancel  Subjective   No CP or dyspnea  Inpatient Medications    Scheduled Meds:  abiraterone acetate  1,000 mg Oral Daily   aspirin EC  81 mg Oral Daily   isosorbide mononitrate  60 mg Oral Daily   predniSONE  5 mg Oral Q breakfast   sodium chloride flush  3 mL Intravenous Q12H   tamsulosin  0.4 mg Oral Daily   Continuous Infusions:  sodium chloride     heparin 1,300 Units/hr (12/21/20 0250)   PRN Meds: sodium chloride, acetaminophen, nitroGLYCERIN, ondansetron (ZOFRAN) IV, oxyCODONE-acetaminophen, sodium chloride flush   Vital Signs    Vitals:   12/20/20 2318 12/21/20 0326 12/21/20 0651 12/21/20 0746  BP: (!) 115/58 (!) 107/53  (!) 110/56  Pulse: (!) 52 (!) 53  (!) 55  Resp: '16 17  16  '$ Temp: 98.1 F (36.7 C) 98 F (36.7 C)  97.8 F (36.6 C)  TempSrc: Oral Oral  Oral  SpO2: 96% 100%  97%  Weight:   75.6 kg   Height:        Intake/Output Summary (Last 24 hours) at 12/21/2020 0753 Last data filed at 12/20/2020 2322 Gross per 24 hour  Intake 312.53 ml  Output 225 ml  Net 87.53 ml   Last 3 Weights 12/21/2020 12/19/2020 12/19/2020  Weight (lbs) 166 lb 10.7 oz 164 lb 14.4 oz 160 lb  Weight (kg) 75.6 kg 74.798 kg 72.576 kg  Some encounter information is confidential and restricted. Go to Review Flowsheets activity to see all data.      Telemetry    Sinus - Personally Reviewed  Physical Exam   GEN: No acute distress.   Neck: No JVD Cardiac: RRR, no murmurs, rubs, or gallops.  Respiratory: Clear to auscultation bilaterally. GI: Soft, nontender, non-distended  MS: No edema Neuro:  Nonfocal  Psych: Normal affect   Labs    High Sensitivity Troponin:   Recent Labs  Lab 12/19/20 1158 12/19/20 1428  TROPONINIHS 46* 97*      Chemistry Recent Labs  Lab 12/19/20 1158 12/21/20 0102  NA 138 136  K 3.8 4.1  CL 111 106  CO2 20* 21*  GLUCOSE 106* 110*   BUN 14 26*  CREATININE 0.71 0.91  CALCIUM 9.2 9.3  PROT 6.2* 5.9*  ALBUMIN 3.3* 3.3*  AST 34 34  ALT 38 35  ALKPHOS 62 43  BILITOT 0.4 0.5  GFRNONAA >60 >60  ANIONGAP 7 9     Hematology Recent Labs  Lab 12/19/20 1158 12/21/20 0102  WBC 6.9 7.1  RBC 4.00* 3.68*  HGB 12.6* 11.8*  HCT 38.2* 33.9*  MCV 95.5 92.1  MCH 31.5 32.1  MCHC 33.0 34.8  RDW 12.5 12.2  PLT 175 169    Radiology    DG Chest 2 View  Result Date: 12/19/2020 CLINICAL DATA:  Chest pain, diaphoresis EXAM: CHEST - 2 VIEW COMPARISON:  03/03/2020 FINDINGS: Lower lung volumes with similar diffuse bilateral interstitial opacities may represent chronic lung disease. Minor basilar atelectasis. Normal heart size and vascularity. No definite focal pneumonia, collapse or consolidation. Negative for effusion or pneumothorax. Trachea midline. Degenerative changes of the spine. IMPRESSION: Similar chronic interstitial lung changes with lower lung volumes. No definite superimposed acute process. Electronically Signed   By: [name].  Shick M.D.   On: 12/19/2020 12:45   MR Cervical Spine W or Wo Contrast  Result Date: 12/20/2020  CLINICAL DATA:  Initial evaluation for intermittent left-sided chest pain, history of metastatic prostate cancer. EXAM: MRI CERVICAL, THORACIC AND LUMBAR SPINE WITHOUT AND WITH CONTRAST TECHNIQUE: Multiplanar and multiecho pulse sequences of the cervical spine, to include the craniocervical junction and cervicothoracic junction, and thoracic and lumbar spine, were obtained without and with intravenous contrast. CONTRAST:  73m GADAVIST GADOBUTROL 1 MMOL/ML IV SOLN COMPARISON:  Prior MRIs from 03/04/2020. FINDINGS: MRI CERVICAL SPINE FINDINGS Alignment: Mild straightening of the normal cervical lordosis. No listhesis. Vertebrae: Diffuse infiltrative low marrow signal intensity again seen throughout the visualized skull base and spine. Previously seen superimposed enhancing lesions are markedly improved as compared to  previous exam, compatible with interval response to therapy. Appearance is most evident at the posterior arch of C2 where only a small focus of enhancement is now seen on today's exam, previously fairly prominent on prior MRI. No definite new or progressive lesions. Vertebral body height maintained without pathologic or interval fracture. Previously question extra osseous tumor on the right at C5-6 is not convincingly seen on today's exam. No other new extra osseous extension. No epidural or intracanalicular involvement identified. Cord: Normal signal and morphology. No abnormal enhancement or intracanalicular involvement of tumor. Posterior Fossa, vertebral arteries, paraspinal tissues: Empty sella partially visualized. Visualized portions of the brain and posterior fossa otherwise unremarkable. Paraspinous and prevertebral soft tissues within normal limits. Normal flow voids seen within the vertebral arteries bilaterally. Disc levels: C2-C3: Negative interspace. Mild left-sided facet hypertrophy. No stenosis. C3-C4: Broad posterior disc osteophyte flattens the ventral thecal sac with resultant mild-to-moderate spinal stenosis. Superimposed uncovertebral and facet hypertrophy with resultant moderate left with mild right C4 foraminal stenosis. C4-C5: Mild disc bulge with uncovertebral spurring. Bilateral facet hypertrophy. No significant spinal stenosis. Moderate right with mild left C5 foraminal narrowing. C5-C6: Degenerative intervertebral disc space narrowing with diffuse disc osteophyte complex. Flattening and partial effacement of the ventral thecal sac, eccentric to the right. Mild spinal stenosis. Severe right worse than left C6 foraminal narrowing. C6-C7: Disc bulge with uncovertebral spurring. Superimposed left paracentral disc protrusion indents the left ventral thecal sac. Mild spinal stenosis. Mild to moderate left with mild right C7 foraminal narrowing. C7-T1:  Normal interspace.  No canal or foraminal  stenosis. MRI THORACIC SPINE FINDINGS Alignment: Physiologic with preservation of the normal thoracic kyphosis. No listhesis. Vertebrae: Diffusely abnormal appearance of the visualized osseous structures, consistent with widespread osseous metastatic disease. Overall appearance is markedly improved as compared to previous exam, with decreased heterogeneous enhancement and STIR signal abnormality seen throughout the thoracic spine. Previously seen trace epidural tumor posterior to the T12 vertebral body is no longer visualized. No new extra osseous or epidural tumor identified. Vertebral body height maintained without pathologic or interval fracture. Cord: Normal signal and morphology. No epidural or intracanalicular tumor. No abnormal enhancement. Paraspinal and other soft tissues: Paraspinous soft tissues within normal limits. Splenomegaly partially visualized. Disc levels: No new significant disc pathology. No significant stenosis or neural impingement. MRI LUMBAR SPINE FINDINGS Segmentation: Standard. Lowest well-formed disc space labeled the L5-S1 level. Alignment: Physiologic with preservation of the normal lumbar lordosis. No listhesis. Vertebrae: Widespread osseous metastatic disease again seen throughout the visualized lumbar spine and pelvis. Overall, appearance is markedly improved with decreased heterogeneous enhancement seen throughout the affected structures as compared to prior. Vertebral body height maintained without interval or pathologic fracture. Previously seen epidural tumor posterior to the S1 vertebral body has largely resolved, and is no longer seen. Similarly, previously seen extraosseous extension from the  bilateral ilium is also no longer clearly visualized. No significant extraosseous extension now seen. No significant epidural or intracanalicular involvement. Conus medullaris and cauda equina: Conus extends to the T12 level. Conus and cauda equina appear normal. Paraspinal and other soft  tissues: Interval resolution in previously seen bulky retroperitoneal adenopathy. Disc levels: L1-2:  Unremarkable. L2-3:  Negative interspace.  Mild facet hypertrophy.  No stenosis. L3-4: Disc bulge with mild facet hypertrophy. No spinal stenosis. Mild left L3 foraminal narrowing. L4-5: Degenerative intervertebral disc space narrowing with diffuse disc bulge and reactive endplate spurring. Mild to moderate facet hypertrophy. Resultant mild canal with moderate bilateral lateral recess stenosis. Moderate right with mild left L4 foraminal narrowing. L5-S1: Degenerative intervertebral disc space narrowing with diffuse disc bulge and reactive endplate spurring. Superimposed left subarticular disc protrusion contacts and mildly displaces the descending left S1 nerve root. Mild-to-moderate facet hypertrophy. Resultant moderate left with mild right lateral recess stenosis. Mild bilateral L5 foraminal narrowing. IMPRESSION: MRI CERVICAL SPINE IMPRESSION: 1. Interval improvement in appearance of widespread osseous metastatic disease, consistent with interval response to therapy. No pathologic fracture. Previously seen extraosseous/epidural tumor at C5-6 is no longer clearly visualized, with no extraosseous or epidural tumor now seen. 2. Underlying multilevel cervical spondylosis with resultant mild-to-moderate spinal stenosis at C3-4 through C6-7 as above. Moderate left C4 and right C5 foraminal stenosis, with severe bilateral C6 foraminal narrowing. MRI THORACIC SPINE IMPRESSION: Marked interval improvement in appearance of widespread osseous metastatic disease, consistent with interval response to therapy. No pathologic fracture. No extra osseous or epidural tumor identified. MRI LUMBAR SPINE IMPRESSION: 1. Interval improvement in widespread osseous metastatic disease involving the lumbar spine and visualized pelvis. No pathologic fracture. Previously seen epidural tumor at the level of S1 has resolved as has the  previously seen extraosseous tumor extension from the bilateral ilium. No significant epidural or extraosseous disease now seen. 2. Interval improvement/resolution in previously seen retroperitoneal adenopathy. 3. Left subarticular disc protrusion at L5-S1, potentially affecting the left S1 nerve root. Additional more mild degenerative spondylosis elsewhere within the lumbar spine as above. Electronically Signed   By: Jeannine Boga M.D.   On: 12/20/2020 05:37   MR THORACIC SPINE W WO CONTRAST  Result Date: 12/20/2020 CLINICAL DATA:  Initial evaluation for intermittent left-sided chest pain, history of metastatic prostate cancer. EXAM: MRI CERVICAL, THORACIC AND LUMBAR SPINE WITHOUT AND WITH CONTRAST TECHNIQUE: Multiplanar and multiecho pulse sequences of the cervical spine, to include the craniocervical junction and cervicothoracic junction, and thoracic and lumbar spine, were obtained without and with intravenous contrast. CONTRAST:  5m GADAVIST GADOBUTROL 1 MMOL/ML IV SOLN COMPARISON:  Prior MRIs from 03/04/2020. FINDINGS: MRI CERVICAL SPINE FINDINGS Alignment: Mild straightening of the normal cervical lordosis. No listhesis. Vertebrae: Diffuse infiltrative low marrow signal intensity again seen throughout the visualized skull base and spine. Previously seen superimposed enhancing lesions are markedly improved as compared to previous exam, compatible with interval response to therapy. Appearance is most evident at the posterior arch of C2 where only a small focus of enhancement is now seen on today's exam, previously fairly prominent on prior MRI. No definite new or progressive lesions. Vertebral body height maintained without pathologic or interval fracture. Previously question extra osseous tumor on the right at C5-6 is not convincingly seen on today's exam. No other new extra osseous extension. No epidural or intracanalicular involvement identified. Cord: Normal signal and morphology. No abnormal  enhancement or intracanalicular involvement of tumor. Posterior Fossa, vertebral arteries, paraspinal tissues: Empty sella partially visualized. Visualized  portions of the brain and posterior fossa otherwise unremarkable. Paraspinous and prevertebral soft tissues within normal limits. Normal flow voids seen within the vertebral arteries bilaterally. Disc levels: C2-C3: Negative interspace. Mild left-sided facet hypertrophy. No stenosis. C3-C4: Broad posterior disc osteophyte flattens the ventral thecal sac with resultant mild-to-moderate spinal stenosis. Superimposed uncovertebral and facet hypertrophy with resultant moderate left with mild right C4 foraminal stenosis. C4-C5: Mild disc bulge with uncovertebral spurring. Bilateral facet hypertrophy. No significant spinal stenosis. Moderate right with mild left C5 foraminal narrowing. C5-C6: Degenerative intervertebral disc space narrowing with diffuse disc osteophyte complex. Flattening and partial effacement of the ventral thecal sac, eccentric to the right. Mild spinal stenosis. Severe right worse than left C6 foraminal narrowing. C6-C7: Disc bulge with uncovertebral spurring. Superimposed left paracentral disc protrusion indents the left ventral thecal sac. Mild spinal stenosis. Mild to moderate left with mild right C7 foraminal narrowing. C7-T1:  Normal interspace.  No canal or foraminal stenosis. MRI THORACIC SPINE FINDINGS Alignment: Physiologic with preservation of the normal thoracic kyphosis. No listhesis. Vertebrae: Diffusely abnormal appearance of the visualized osseous structures, consistent with widespread osseous metastatic disease. Overall appearance is markedly improved as compared to previous exam, with decreased heterogeneous enhancement and STIR signal abnormality seen throughout the thoracic spine. Previously seen trace epidural tumor posterior to the T12 vertebral body is no longer visualized. No new extra osseous or epidural tumor identified.  Vertebral body height maintained without pathologic or interval fracture. Cord: Normal signal and morphology. No epidural or intracanalicular tumor. No abnormal enhancement. Paraspinal and other soft tissues: Paraspinous soft tissues within normal limits. Splenomegaly partially visualized. Disc levels: No new significant disc pathology. No significant stenosis or neural impingement. MRI LUMBAR SPINE FINDINGS Segmentation: Standard. Lowest well-formed disc space labeled the L5-S1 level. Alignment: Physiologic with preservation of the normal lumbar lordosis. No listhesis. Vertebrae: Widespread osseous metastatic disease again seen throughout the visualized lumbar spine and pelvis. Overall, appearance is markedly improved with decreased heterogeneous enhancement seen throughout the affected structures as compared to prior. Vertebral body height maintained without interval or pathologic fracture. Previously seen epidural tumor posterior to the S1 vertebral body has largely resolved, and is no longer seen. Similarly, previously seen extraosseous extension from the bilateral ilium is also no longer clearly visualized. No significant extraosseous extension now seen. No significant epidural or intracanalicular involvement. Conus medullaris and cauda equina: Conus extends to the T12 level. Conus and cauda equina appear normal. Paraspinal and other soft tissues: Interval resolution in previously seen bulky retroperitoneal adenopathy. Disc levels: L1-2:  Unremarkable. L2-3:  Negative interspace.  Mild facet hypertrophy.  No stenosis. L3-4: Disc bulge with mild facet hypertrophy. No spinal stenosis. Mild left L3 foraminal narrowing. L4-5: Degenerative intervertebral disc space narrowing with diffuse disc bulge and reactive endplate spurring. Mild to moderate facet hypertrophy. Resultant mild canal with moderate bilateral lateral recess stenosis. Moderate right with mild left L4 foraminal narrowing. L5-S1: Degenerative  intervertebral disc space narrowing with diffuse disc bulge and reactive endplate spurring. Superimposed left subarticular disc protrusion contacts and mildly displaces the descending left S1 nerve root. Mild-to-moderate facet hypertrophy. Resultant moderate left with mild right lateral recess stenosis. Mild bilateral L5 foraminal narrowing. IMPRESSION: MRI CERVICAL SPINE IMPRESSION: 1. Interval improvement in appearance of widespread osseous metastatic disease, consistent with interval response to therapy. No pathologic fracture. Previously seen extraosseous/epidural tumor at C5-6 is no longer clearly visualized, with no extraosseous or epidural tumor now seen. 2. Underlying multilevel cervical spondylosis with resultant mild-to-moderate spinal stenosis at C3-4  through C6-7 as above. Moderate left C4 and right C5 foraminal stenosis, with severe bilateral C6 foraminal narrowing. MRI THORACIC SPINE IMPRESSION: Marked interval improvement in appearance of widespread osseous metastatic disease, consistent with interval response to therapy. No pathologic fracture. No extra osseous or epidural tumor identified. MRI LUMBAR SPINE IMPRESSION: 1. Interval improvement in widespread osseous metastatic disease involving the lumbar spine and visualized pelvis. No pathologic fracture. 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 No significant epidural or extraosseous disease now seen. 2. Interval improvement/resolution in previously seen retroperitoneal adenopathy. 3. Left subarticular disc protrusion at L5-S1, potentially affecting the left S1 nerve root. Additional more mild degenerative spondylosis elsewhere within the lumbar spine as above. Electronically Signed   By: Jeannine Boga M.D.   On: 12/20/2020 05:37   MR Lumbar Spine W Wo Contrast  Result Date: 12/20/2020 CLINICAL DATA:  Initial evaluation for intermittent  left-sided chest pain, history of metastatic prostate cancer. EXAM: MRI CERVICAL, THORACIC AND LUMBAR SPINE WITHOUT AND WITH CONTRAST TECHNIQUE: Multiplanar and multiecho pulse sequences of the cervical spine, to include the craniocervical junction and cervicothoracic junction, and thoracic and lumbar spine, were obtained without and with intravenous contrast. CONTRAST:  68m GADAVIST GADOBUTROL 1 MMOL/ML IV SOLN COMPARISON:  Prior MRIs from 03/04/2020. FINDINGS: MRI CERVICAL SPINE FINDINGS Alignment: Mild straightening of the normal cervical lordosis. No listhesis. Vertebrae: Diffuse infiltrative low marrow signal intensity again seen throughout the visualized skull base and spine. Previously seen superimposed enhancing lesions are markedly improved as compared to previous exam, compatible with interval response to therapy. Appearance is most evident at the posterior arch of C2 where only a small focus of enhancement is now seen on today's exam, previously fairly prominent on prior MRI. No definite new or progressive lesions. Vertebral body height maintained without pathologic or interval fracture. Previously question extra osseous tumor on the right at C5-6 is not convincingly seen on today's exam. No other new extra osseous extension. No epidural or intracanalicular involvement identified. Cord: Normal signal and morphology. No abnormal enhancement or intracanalicular involvement of tumor. Posterior Fossa, vertebral arteries, paraspinal tissues: Empty sella partially visualized. Visualized portions of the brain and posterior fossa otherwise unremarkable. Paraspinous and prevertebral soft tissues within normal limits. Normal flow voids seen within the vertebral arteries bilaterally. Disc levels: C2-C3: Negative interspace. Mild left-sided facet hypertrophy. No stenosis. C3-C4: Broad posterior disc osteophyte flattens the ventral thecal sac with resultant mild-to-moderate spinal stenosis. Superimposed uncovertebral  and facet hypertrophy with resultant moderate left with mild right C4 foraminal stenosis. C4-C5: Mild disc bulge with uncovertebral spurring. Bilateral facet hypertrophy. No significant spinal stenosis. Moderate right with mild left C5 foraminal narrowing. C5-C6: Degenerative intervertebral disc space narrowing with diffuse disc osteophyte complex. Flattening and partial effacement of the ventral thecal sac, eccentric to the right. Mild spinal stenosis. Severe right worse than left C6 foraminal narrowing. C6-C7: Disc bulge with uncovertebral spurring. Superimposed left paracentral disc protrusion indents the left ventral thecal sac. Mild spinal stenosis. Mild to moderate left with mild right C7 foraminal narrowing. C7-T1:  Normal interspace.  No canal or foraminal stenosis. MRI THORACIC SPINE FINDINGS Alignment: Physiologic with preservation of the normal thoracic kyphosis. No listhesis. Vertebrae: Diffusely abnormal appearance of the visualized osseous structures, consistent with widespread osseous metastatic disease. Overall appearance is markedly improved as compared to previous exam, with decreased heterogeneous enhancement and STIR signal abnormality seen throughout the thoracic spine. Previously seen  trace epidural tumor posterior to the T12 vertebral body is no longer visualized. No new extra osseous or epidural tumor identified. Vertebral body height maintained without pathologic or interval fracture. Cord: Normal signal and morphology. No epidural or intracanalicular tumor. No abnormal enhancement. Paraspinal and other soft tissues: Paraspinous soft tissues within normal limits. Splenomegaly partially visualized. Disc levels: No new significant disc pathology. No significant stenosis or neural impingement. MRI LUMBAR SPINE FINDINGS Segmentation: Standard. Lowest well-formed disc space labeled the L5-S1 level. Alignment: Physiologic with preservation of the normal lumbar lordosis. No listhesis. Vertebrae:  Widespread osseous metastatic disease again seen throughout the visualized lumbar spine and pelvis. Overall, appearance is markedly improved with decreased heterogeneous enhancement seen throughout the affected structures as compared to prior. Vertebral body height maintained without interval or pathologic fracture. Previously seen epidural tumor posterior to the S1 vertebral body has largely resolved, and is no longer seen. Similarly, previously seen extraosseous extension from the bilateral ilium is also no longer clearly visualized. No significant extraosseous extension now seen. No significant epidural or intracanalicular involvement. Conus medullaris and cauda equina: Conus extends to the T12 level. Conus and cauda equina appear normal. Paraspinal and other soft tissues: Interval resolution in previously seen bulky retroperitoneal adenopathy. Disc levels: L1-2:  Unremarkable. L2-3:  Negative interspace.  Mild facet hypertrophy.  No stenosis. L3-4: Disc bulge with mild facet hypertrophy. No spinal stenosis. Mild left L3 foraminal narrowing. L4-5: Degenerative intervertebral disc space narrowing with diffuse disc bulge and reactive endplate spurring. Mild to moderate facet hypertrophy. Resultant mild canal with moderate bilateral lateral recess stenosis. Moderate right with mild left L4 foraminal narrowing. L5-S1: Degenerative intervertebral disc space narrowing with diffuse disc bulge and reactive endplate spurring. Superimposed left subarticular disc protrusion contacts and mildly displaces the descending left S1 nerve root. Mild-to-moderate facet hypertrophy. Resultant moderate left with mild right lateral recess stenosis. Mild bilateral L5 foraminal narrowing. IMPRESSION: MRI CERVICAL SPINE IMPRESSION: 1. Interval improvement in appearance of widespread osseous metastatic disease, consistent with interval response to therapy. No pathologic fracture. Previously seen extraosseous/epidural tumor at C5-6 is no  longer clearly visualized, with no extraosseous or epidural tumor now seen. 2. Underlying multilevel cervical spondylosis with resultant mild-to-moderate spinal stenosis at C3-4 through C6-7 as above. Moderate left C4 and right C5 foraminal stenosis, with severe bilateral C6 foraminal narrowing. MRI THORACIC SPINE IMPRESSION: Marked interval improvement in appearance of widespread osseous metastatic disease, consistent with interval response to therapy. No pathologic fracture. No extra osseous or epidural tumor identified. MRI LUMBAR SPINE IMPRESSION: 1. Interval improvement in widespread osseous metastatic disease involving the lumbar spine and visualized pelvis. No pathologic fracture. 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 No significant epidural or extraosseous disease now seen. 2. Interval improvement/resolution in previously seen retroperitoneal adenopathy. 3. Left subarticular disc protrusion at L5-S1, potentially affecting the left S1 nerve root. Additional more mild degenerative spondylosis elsewhere within the lumbar spine as above. Electronically Signed   By: Jeannine Boga M.D.   On: 12/20/2020 05:37   ECHOCARDIOGRAM COMPLETE  Result Date: 12/19/2020    ECHOCARDIOGRAM REPORT   Patient Name:   Edgar Mooney Date of Exam: 12/19/2020 Medical Rec #:  [id number]     Height:       62.0 in Accession #:    [id number]    Weight:       160.0 lb Date of Birth:  05-28-1965  BSA:          1.739 m Patient Age:    80 years      BP:           160/79 mmHg Patient Gender: M             HR:           57 bpm. Exam Location:  Inpatient Procedure: 2D Echo Indications:    chest pain  History:        Patient has no prior history of Echocardiogram examinations.                 Risk Factors:Hypertension and polysubstance abuse.  Sonographer:    Johny Chess RDCS Referring Phys: [id number] Edith Endave   1. Left ventricular ejection fraction, by estimation, is 50 to 55%. The left ventricle has low normal function. The left ventricle demonstrates regional wall motion abnormalities with basal to mid inferior hypokinesis. Left ventricular diastolic parameters were normal.  2. Right ventricular systolic function is normal. The right ventricular size is normal. Tricuspid regurgitation signal is inadequate for assessing PA pressure.  3. Left atrial size was mild to moderately dilated.  4. The mitral valve is normal in structure. No evidence of mitral valve regurgitation. No evidence of mitral stenosis.  5. The aortic valve is tricuspid. Aortic valve regurgitation is not visualized. No aortic stenosis is present.  6. The inferior vena cava is normal in size with greater than 50% respiratory variability, suggesting right atrial pressure of 3 mmHg. FINDINGS  Left Ventricle: Left ventricular ejection fraction, by estimation, is 50 to 55%. The left ventricle has low normal function. The left ventricle demonstrates regional wall motion abnormalities. The left ventricular internal cavity size was normal in size. There is no left ventricular hypertrophy. Left ventricular diastolic parameters were normal. Right Ventricle: The right ventricular size is normal. No increase in right ventricular wall thickness. Right ventricular systolic function is normal. Tricuspid regurgitation signal is inadequate for assessing PA pressure. Left Atrium: Left atrial size was mild to moderately dilated. Right Atrium: Right atrial size was normal in size. Pericardium: There is no evidence of pericardial effusion. Mitral Valve: The mitral valve is normal in structure. No evidence of mitral valve regurgitation. No evidence of mitral valve stenosis. Tricuspid Valve: The tricuspid valve is normal in structure. Tricuspid valve regurgitation is not demonstrated. Aortic Valve: The aortic valve is tricuspid. Aortic valve regurgitation is not visualized. No  aortic stenosis is present. Pulmonic Valve: The pulmonic valve was normal in structure. Pulmonic valve regurgitation is not visualized. Aorta: The aortic root is normal in size and structure. Venous: The inferior vena cava is normal in size with greater than 50% respiratory variability, suggesting right atrial pressure of 3 mmHg. IAS/Shunts: No atrial level shunt detected by color flow Doppler.  LEFT VENTRICLE PLAX 2D LVIDd:         5.10 cm  Diastology LVIDs:         3.20 cm  LV e' medial:    8.92 cm/s LV PW:         0.90 cm  LV E/e' medial:  11.0 LV IVS:        0.80 cm  LV e' lateral:   11.20 cm/s LVOT diam:     2.10 cm  LV E/e' lateral: 8.8 LV SV:         68 LV SV Index:   39 LVOT Area:     3.46 cm  RIGHT VENTRICLE  IVC RV S prime:     15.40 cm/s  IVC diam: 1.90 cm TAPSE (M-mode): 2.4 cm LEFT ATRIUM             Index       RIGHT ATRIUM           Index LA diam:        3.80 cm 2.19 cm/m  RA Area:     11.90 cm LA Vol (A2C):   75.9 ml 43.65 ml/m RA Volume:   26.70 ml  15.36 ml/m LA Vol (A4C):   69.1 ml 39.74 ml/m LA Biplane Vol: 75.3 ml 43.31 ml/m  AORTIC VALVE LVOT Vmax:   101.00 cm/s LVOT Vmean:  61.800 cm/s LVOT VTI:    0.197 m  AORTA Ao Root diam: 3.00 cm Ao Asc diam:  3.30 cm MITRAL VALVE MV Area (PHT): 3.00 cm    SHUNTS MV Decel Time: 253 msec    Systemic VTI:  0.20 m MV E velocity: 98.50 cm/s  Systemic Diam: 2.10 cm MV A velocity: 62.90 cm/s MV E/A ratio:  1.57 Dalton McleanMD Electronically signed by Franki Monte Signature Date/Time: 12/19/2020/5:32:35 PM    Final      Patient Profile     55 y.o. male with past medical history of metastatic prostate cancer, hepatitis C, substance abuse, probable newly diagnosed hepatocellular carcinoma admitted with unstable angina/non-ST elevation myocardial infarction.  Echocardiogram shows preserved LV function with inferior hypokinesis and mild to moderate left atrial enlargement.  Assessment & Plan    1 non-ST elevation myocardial  infarction-patient remains pain-free.  He has had progressive chest pain now occurring at rest and his troponins are abnormal.  He has been seen by oncology and cleared for anticoagulation.  Continue aspirin, heparin and isosorbide.  He is not on a beta-blocker as he is somewhat bradycardic and also has drug screen positive for cocaine.  I have not added a statin given his potential liver disease/cirrhosis.  We will proceed with cardiac catheterization given the progressive nature of his symptoms.  He clearly is not a candidate for surgical intervention given his other comorbidities.  However he would be a candidate for PCI.  The risk and benefits of cardiac catheterization including myocardial infarction, CVA and death discussed and he agrees to proceed.  2 metastatic prostate cancer-response to therapy noted on recent MRI.  Management per oncology.  3 liver mass-concern is newly diagnosed hepatocellular carcinoma.  He will need follow-up with oncology after discharge.  4 substance abuse-patient counseled on avoiding.  For questions or updates, please contact [facility] Please consult [email or web address] for contact info under        Signed, Kirk Ruths, MD  12/21/2020, 7:53 AM

## 2020-12-21 NOTE — Progress Notes (Signed)
[place] for Heparin Indication: chest pain/ACS  No Known Allergies  Patient Measurements: Height: '5\' 2"'$  (157.5 cm) Weight: 75.6 kg (166 lb 10.7 oz) IBW/kg (Calculated) : 54.6  Heparin Dosing Weight: 70.2 kg  Vital Signs: Temp: 97.8 F (36.6 C) (09/07 1249) Temp Source: Oral (09/07 1249) BP: 119/72 (09/07 1249) Pulse Rate: 56 (09/07 1249)  Labs: Recent Labs    12/19/20 1158 12/19/20 1428 12/20/20 2040 12/21/20 0102 12/21/20 0853 12/21/20 1215  HGB 12.6*  --   --  11.8*  --   --   HCT 38.2*  --   --  33.9*  --   --   PLT 175  --   --  169  --   --   HEPARINUNFRC  --   --  <0.10* <0.10* 0.12*  --   CREATININE 0.71  --   --  0.91  --   --   TROPONINIHS 46* 97*  --   --   --  28*     Estimated Creatinine Clearance: 81.7 mL/min (by C-G formula based on SCr of 0.91 mg/dL).   Assessment: 55 y.o. male presented on 9/05 for ongoing chest pain. Initially, patient was not started on heparin due to previous imaging with concerns of epidural space mass. Per Cardiology recommendations, OK to initiate IV heparin for ACS.  He is now s/p cath with stent to LAD and plans for staged PCI to RCA. Heparin to restart 8 hours post sheath removal (removed ~ 11:30am)   Goal of Therapy:  Heparin level 0.3-0.7 units/ml Monitor platelets by anticoagulation protocol: Yes   Plan:  - Restart heparin 1550 unit/hr at 8pm -Heparin level daily wth CBC daily  Hildred Laser, PharmD Clinical Pharmacist **Pharmacist phone directory can now be found on [email or web address] ([email or web address]).  Listed under [place].

## 2020-12-21 NOTE — TOC Benefit Eligibility Note (Signed)
Patient Teacher, English as a foreign language completed.    The patient is currently admitted and upon discharge could be taking Brilinta 90 mg.  The current 30 day co-pay is, $0.00.   The patient is insured through [employer], [place] Patient Advocate Specialist [place] Team Direct Number: [phone]  Fax: [phone]

## 2020-12-21 NOTE — Progress Notes (Signed)
[facility] PROGRESS NOTE    Progress Note  Edgar Mooney  [id number] DOB: 02-Jul-1965 DOA: 12/19/2020 PCP: Patient, No Pcp Per (Inactive)     Brief Narrative:   Edgar Mooney is an 55 y.o. male past medical history significant for CAD, alcohol abuse, chronic hepatitis C, polysubstance abuse including cocaine with metastatic prostate cancer to the bone presents to the [name], ED on 12/19/2020 for chest pain substernal associated with difficulty breathing.  Cardiology was consulted due to concern of unstable angina    Assessment/Plan:   Unstable angina [name]): Cardiology was consulted, twelve-lead EKG abnormal as above, UDS was positive for cocaine. He is currently on aspirin statin and isosorbide not on a beta-blocker in the setting of cocaine use. Is a complicated issue with no metastatic disease in the epidural space and enlarged liver lesion. Started on a statin due to potential liver disease. Can benefits were discussed with the patient and cardiology recommended to proceed with a left heart cath. Started on IV heparin.  Metastatic prostate cancer with osseous metastasis: Oncologist [name] was consulted.  With a new liver mass alpha-fetoprotein was elevated significantly so there is a concern hepatocellular carcinoma. Oncology recommended no contraindication to proceeding with anticoagulation if needed from a cardiac standpoint.  Liver mass: MRI of the liver appearance with contrast-enhancement characteristic favors intrahepatic cholangiocarcinoma although hepatocellular carcinoma is also in the differential.  There is no definite thrombus within the right portal vein. There is diffuse enhancing of the vertebral bone compatible with his known metastatic malignancy.  Essential hypertension: Continue nitrates with beta-blockers in the setting of cocaine abuse. Blood pressure appears to be relatively well controlled.  Cocaine abuse: Not a candidate for beta-blocker  at this point, his UDS was positive for cocaine. He has been counseled.  Alcohol abuse: Counseled.  History of gout: Not taking on [place]an[place] anymore.  Chronic hepatitis C: Status posttreatment in the past.    DVT prophylaxis: heparin Family Communication:none Status is: Inpatient  Remains inpatient appropriate because:Hemodynamically unstable  Dispo: The patient is from: Home              Anticipated d/c is to: Home              Patient currently is not medically stable to d/c.   Difficult to place patient No  Code Status:     Code Status Orders  (From admission, onward)           Start     Ordered   12/19/20 1701  Full code  Continuous        12/19/20 1701           Code Status History     Date Active Date Inactive Code Status Order ID Comments User Context   03/03/2020 1347 03/08/2020 1640 Full Code [id number]  [name], MD ED   10/25/2017 0336 10/26/2017 0333 Full Code [id number]  [name], MD ED   04/03/2016 0531 04/03/2016 1612 Full Code [id number]  [name], MD ED   01/14/2016 2003 01/16/2016 2034 Full Code [id number]  [name], MD Inpatient   01/05/2016 1451 01/11/2016 1202 Full Code [id number]  [name], NP Inpatient   01/04/2016 1944 01/05/2016 1431 Full Code [id number]  [name], PA-C ED   01/09/2012 1522 01/11/2012 1451 Full Code [id number]  [name], PA-C ED         IV Access:   Peripheral IV   Procedures and diagnostic studies:   DG  Chest 2 View  Result Date: 12/19/2020 CLINICAL DATA:  Chest pain, diaphoresis EXAM: CHEST - 2 VIEW COMPARISON:  03/03/2020 FINDINGS: Lower lung volumes with similar diffuse bilateral interstitial opacities may represent chronic lung disease. Minor basilar atelectasis. Normal heart size and vascularity. No definite focal pneumonia, collapse or consolidation. Negative for effusion or pneumothorax. Trachea midline. Degenerative changes of the spine. IMPRESSION: Similar chronic interstitial lung  changes with lower lung volumes. No definite superimposed acute process. Electronically Signed   By: [name].  Shick M.D.   On: 12/19/2020 12:45   MR Cervical Spine W or Wo Contrast  Result Date: 12/20/2020 CLINICAL DATA:  Initial evaluation for intermittent left-sided chest pain, history of metastatic prostate cancer. EXAM: MRI CERVICAL, THORACIC AND LUMBAR SPINE WITHOUT AND WITH CONTRAST TECHNIQUE: Multiplanar and multiecho pulse sequences of the cervical spine, to include the craniocervical junction and cervicothoracic junction, and thoracic and lumbar spine, were obtained without and with intravenous contrast. CONTRAST:  58m GADAVIST GADOBUTROL 1 MMOL/ML IV SOLN COMPARISON:  Prior MRIs from 03/04/2020. FINDINGS: MRI CERVICAL SPINE FINDINGS Alignment: Mild straightening of the normal cervical lordosis. No listhesis. Vertebrae: Diffuse infiltrative low marrow signal intensity again seen throughout the visualized skull base and spine. Previously seen superimposed enhancing lesions are markedly improved as compared to previous exam, compatible with interval response to therapy. Appearance is most evident at the posterior arch of C2 where only a small focus of enhancement is now seen on today's exam, previously fairly prominent on prior MRI. No definite new or progressive lesions. Vertebral body height maintained without pathologic or interval fracture. Previously question extra osseous tumor on the right at C5-6 is not convincingly seen on today's exam. No other new extra osseous extension. No epidural or intracanalicular involvement identified. Cord: Normal signal and morphology. No abnormal enhancement or intracanalicular involvement of tumor. Posterior Fossa, vertebral arteries, paraspinal tissues: Empty sella partially visualized. Visualized portions of the brain and posterior fossa otherwise unremarkable. Paraspinous and prevertebral soft tissues within normal limits. Normal flow voids seen within the vertebral  arteries bilaterally. Disc levels: C2-C3: Negative interspace. Mild left-sided facet hypertrophy. No stenosis. C3-C4: Broad posterior disc osteophyte flattens the ventral thecal sac with resultant mild-to-moderate spinal stenosis. Superimposed uncovertebral and facet hypertrophy with resultant moderate left with mild right C4 foraminal stenosis. C4-C5: Mild disc bulge with uncovertebral spurring. Bilateral facet hypertrophy. No significant spinal stenosis. Moderate right with mild left C5 foraminal narrowing. C5-C6: Degenerative intervertebral disc space narrowing with diffuse disc osteophyte complex. Flattening and partial effacement of the ventral thecal sac, eccentric to the right. Mild spinal stenosis. Severe right worse than left C6 foraminal narrowing. C6-C7: Disc bulge with uncovertebral spurring. Superimposed left paracentral disc protrusion indents the left ventral thecal sac. Mild spinal stenosis. Mild to moderate left with mild right C7 foraminal narrowing. C7-T1:  Normal interspace.  No canal or foraminal stenosis. MRI THORACIC SPINE FINDINGS Alignment: Physiologic with preservation of the normal thoracic kyphosis. No listhesis. Vertebrae: Diffusely abnormal appearance of the visualized osseous structures, consistent with widespread osseous metastatic disease. Overall appearance is markedly improved as compared to previous exam, with decreased heterogeneous enhancement and STIR signal abnormality seen throughout the thoracic spine. Previously seen trace epidural tumor posterior to the T12 vertebral body is no longer visualized. No new extra osseous or epidural tumor identified. Vertebral body height maintained without pathologic or interval fracture. Cord: Normal signal and morphology. No epidural or intracanalicular tumor. No abnormal enhancement. Paraspinal and other soft tissues: Paraspinous soft tissues within normal limits. Splenomegaly partially  visualized. Disc levels: No new significant disc  pathology. No significant stenosis or neural impingement. MRI LUMBAR SPINE FINDINGS Segmentation: Standard. Lowest well-formed disc space labeled the L5-S1 level. Alignment: Physiologic with preservation of the normal lumbar lordosis. No listhesis. Vertebrae: Widespread osseous metastatic disease again seen throughout the visualized lumbar spine and pelvis. Overall, appearance is markedly improved with decreased heterogeneous enhancement seen throughout the affected structures as compared to prior. Vertebral body height maintained without interval or pathologic fracture. Previously seen epidural tumor posterior to the S1 vertebral body has largely resolved, and is no longer seen. Similarly, previously seen extraosseous extension from the bilateral ilium is also no longer clearly visualized. No significant extraosseous extension now seen. No significant epidural or intracanalicular involvement. Conus medullaris and cauda equina: Conus extends to the T12 level. Conus and cauda equina appear normal. Paraspinal and other soft tissues: Interval resolution in previously seen bulky retroperitoneal adenopathy. Disc levels: L1-2:  Unremarkable. L2-3:  Negative interspace.  Mild facet hypertrophy.  No stenosis. L3-4: Disc bulge with mild facet hypertrophy. No spinal stenosis. Mild left L3 foraminal narrowing. L4-5: Degenerative intervertebral disc space narrowing with diffuse disc bulge and reactive endplate spurring. Mild to moderate facet hypertrophy. Resultant mild canal with moderate bilateral lateral recess stenosis. Moderate right with mild left L4 foraminal narrowing. L5-S1: Degenerative intervertebral disc space narrowing with diffuse disc bulge and reactive endplate spurring. Superimposed left subarticular disc protrusion contacts and mildly displaces the descending left S1 nerve root. Mild-to-moderate facet hypertrophy. Resultant moderate left with mild right lateral recess stenosis. Mild bilateral L5 foraminal  narrowing. IMPRESSION: MRI CERVICAL SPINE IMPRESSION: 1. Interval improvement in appearance of widespread osseous metastatic disease, consistent with interval response to therapy. No pathologic fracture. Previously seen extraosseous/epidural tumor at C5-6 is no longer clearly visualized, with no extraosseous or epidural tumor now seen. 2. Underlying multilevel cervical spondylosis with resultant mild-to-moderate spinal stenosis at C3-4 through C6-7 as above. Moderate left C4 and right C5 foraminal stenosis, with severe bilateral C6 foraminal narrowing. MRI THORACIC SPINE IMPRESSION: Marked interval improvement in appearance of widespread osseous metastatic disease, consistent with interval response to therapy. No pathologic fracture. No extra osseous or epidural tumor identified. MRI LUMBAR SPINE IMPRESSION: 1. Interval improvement in widespread osseous metastatic disease involving the lumbar spine and visualized pelvis. No pathologic fracture. 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 No significant epidural or extraosseous disease now seen. 2. Interval improvement/resolution in previously seen retroperitoneal adenopathy. 3. Left subarticular disc protrusion at L5-S1, potentially affecting the left S1 nerve root. Additional more mild degenerative spondylosis elsewhere within the lumbar spine as above. Electronically Signed   By: Jeannine Boga M.D.   On: 12/20/2020 05:37   MR THORACIC SPINE W WO CONTRAST  Result Date: 12/20/2020 CLINICAL DATA:  Initial evaluation for intermittent left-sided chest pain, history of metastatic prostate cancer. EXAM: MRI CERVICAL, THORACIC AND LUMBAR SPINE WITHOUT AND WITH CONTRAST TECHNIQUE: Multiplanar and multiecho pulse sequences of the cervical spine, to include the craniocervical junction and cervicothoracic junction, and thoracic and lumbar spine, were obtained without  and with intravenous contrast. CONTRAST:  28m GADAVIST GADOBUTROL 1 MMOL/ML IV SOLN COMPARISON:  Prior MRIs from 03/04/2020. FINDINGS: MRI CERVICAL SPINE FINDINGS Alignment: Mild straightening of the normal cervical lordosis. No listhesis. Vertebrae: Diffuse infiltrative low marrow signal intensity again seen throughout the visualized skull base and spine. Previously seen superimposed enhancing lesions are markedly improved as compared to  previous exam, compatible with interval response to therapy. Appearance is most evident at the posterior arch of C2 where only a small focus of enhancement is now seen on today's exam, previously fairly prominent on prior MRI. No definite new or progressive lesions. Vertebral body height maintained without pathologic or interval fracture. Previously question extra osseous tumor on the right at C5-6 is not convincingly seen on today's exam. No other new extra osseous extension. No epidural or intracanalicular involvement identified. Cord: Normal signal and morphology. No abnormal enhancement or intracanalicular involvement of tumor. Posterior Fossa, vertebral arteries, paraspinal tissues: Empty sella partially visualized. Visualized portions of the brain and posterior fossa otherwise unremarkable. Paraspinous and prevertebral soft tissues within normal limits. Normal flow voids seen within the vertebral arteries bilaterally. Disc levels: C2-C3: Negative interspace. Mild left-sided facet hypertrophy. No stenosis. C3-C4: Broad posterior disc osteophyte flattens the ventral thecal sac with resultant mild-to-moderate spinal stenosis. Superimposed uncovertebral and facet hypertrophy with resultant moderate left with mild right C4 foraminal stenosis. C4-C5: Mild disc bulge with uncovertebral spurring. Bilateral facet hypertrophy. No significant spinal stenosis. Moderate right with mild left C5 foraminal narrowing. C5-C6: Degenerative intervertebral disc space narrowing with diffuse disc  osteophyte complex. Flattening and partial effacement of the ventral thecal sac, eccentric to the right. Mild spinal stenosis. Severe right worse than left C6 foraminal narrowing. C6-C7: Disc bulge with uncovertebral spurring. Superimposed left paracentral disc protrusion indents the left ventral thecal sac. Mild spinal stenosis. Mild to moderate left with mild right C7 foraminal narrowing. C7-T1:  Normal interspace.  No canal or foraminal stenosis. MRI THORACIC SPINE FINDINGS Alignment: Physiologic with preservation of the normal thoracic kyphosis. No listhesis. Vertebrae: Diffusely abnormal appearance of the visualized osseous structures, consistent with widespread osseous metastatic disease. Overall appearance is markedly improved as compared to previous exam, with decreased heterogeneous enhancement and STIR signal abnormality seen throughout the thoracic spine. Previously seen trace epidural tumor posterior to the T12 vertebral body is no longer visualized. No new extra osseous or epidural tumor identified. Vertebral body height maintained without pathologic or interval fracture. Cord: Normal signal and morphology. No epidural or intracanalicular tumor. No abnormal enhancement. Paraspinal and other soft tissues: Paraspinous soft tissues within normal limits. Splenomegaly partially visualized. Disc levels: No new significant disc pathology. No significant stenosis or neural impingement. MRI LUMBAR SPINE FINDINGS Segmentation: Standard. Lowest well-formed disc space labeled the L5-S1 level. Alignment: Physiologic with preservation of the normal lumbar lordosis. No listhesis. Vertebrae: Widespread osseous metastatic disease again seen throughout the visualized lumbar spine and pelvis. Overall, appearance is markedly improved with decreased heterogeneous enhancement seen throughout the affected structures as compared to prior. Vertebral body height maintained without interval or pathologic fracture. Previously seen  epidural tumor posterior to the S1 vertebral body has largely resolved, and is no longer seen. Similarly, previously seen extraosseous extension from the bilateral ilium is also no longer clearly visualized. No significant extraosseous extension now seen. No significant epidural or intracanalicular involvement. Conus medullaris and cauda equina: Conus extends to the T12 level. Conus and cauda equina appear normal. Paraspinal and other soft tissues: Interval resolution in previously seen bulky retroperitoneal adenopathy. Disc levels: L1-2:  Unremarkable. L2-3:  Negative interspace.  Mild facet hypertrophy.  No stenosis. L3-4: Disc bulge with mild facet hypertrophy. No spinal stenosis. Mild left L3 foraminal narrowing. L4-5: Degenerative intervertebral disc space narrowing with diffuse disc bulge and reactive endplate spurring. Mild to moderate facet hypertrophy. Resultant mild canal with moderate bilateral lateral recess stenosis. Moderate right with mild  left L4 foraminal narrowing. L5-S1: Degenerative intervertebral disc space narrowing with diffuse disc bulge and reactive endplate spurring. Superimposed left subarticular disc protrusion contacts and mildly displaces the descending left S1 nerve root. Mild-to-moderate facet hypertrophy. Resultant moderate left with mild right lateral recess stenosis. Mild bilateral L5 foraminal narrowing. IMPRESSION: MRI CERVICAL SPINE IMPRESSION: 1. Interval improvement in appearance of widespread osseous metastatic disease, consistent with interval response to therapy. No pathologic fracture. Previously seen extraosseous/epidural tumor at C5-6 is no longer clearly visualized, with no extraosseous or epidural tumor now seen. 2. Underlying multilevel cervical spondylosis with resultant mild-to-moderate spinal stenosis at C3-4 through C6-7 as above. Moderate left C4 and right C5 foraminal stenosis, with severe bilateral C6 foraminal narrowing. MRI THORACIC SPINE IMPRESSION: Marked  interval improvement in appearance of widespread osseous metastatic disease, consistent with interval response to therapy. No pathologic fracture. No extra osseous or epidural tumor identified. MRI LUMBAR SPINE IMPRESSION: 1. Interval improvement in widespread osseous metastatic disease involving the lumbar spine and visualized pelvis. No pathologic fracture. 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 No significant epidural or extraosseous disease now seen. 2. Interval improvement/resolution in previously seen retroperitoneal adenopathy. 3. Left subarticular disc protrusion at L5-S1, potentially affecting the left S1 nerve root. Additional more mild degenerative spondylosis elsewhere within the lumbar spine as above. Electronically Signed   By: Jeannine Boga M.D.   On: 12/20/2020 05:37   MR Lumbar Spine W Wo Contrast  Result Date: 12/20/2020 CLINICAL DATA:  Initial evaluation for intermittent left-sided chest pain, history of metastatic prostate cancer. EXAM: MRI CERVICAL, THORACIC AND LUMBAR SPINE WITHOUT AND WITH CONTRAST TECHNIQUE: Multiplanar and multiecho pulse sequences of the cervical spine, to include the craniocervical junction and cervicothoracic junction, and thoracic and lumbar spine, were obtained without and with intravenous contrast. CONTRAST:  37m GADAVIST GADOBUTROL 1 MMOL/ML IV SOLN COMPARISON:  Prior MRIs from 03/04/2020. FINDINGS: MRI CERVICAL SPINE FINDINGS Alignment: Mild straightening of the normal cervical lordosis. No listhesis. Vertebrae: Diffuse infiltrative low marrow signal intensity again seen throughout the visualized skull base and spine. Previously seen superimposed enhancing lesions are markedly improved as compared to previous exam, compatible with interval response to therapy. Appearance is most evident at the posterior arch of C2 where only a small focus of enhancement  is now seen on today's exam, previously fairly prominent on prior MRI. No definite new or progressive lesions. Vertebral body height maintained without pathologic or interval fracture. Previously question extra osseous tumor on the right at C5-6 is not convincingly seen on today's exam. No other new extra osseous extension. No epidural or intracanalicular involvement identified. Cord: Normal signal and morphology. No abnormal enhancement or intracanalicular involvement of tumor. Posterior Fossa, vertebral arteries, paraspinal tissues: Empty sella partially visualized. Visualized portions of the brain and posterior fossa otherwise unremarkable. Paraspinous and prevertebral soft tissues within normal limits. Normal flow voids seen within the vertebral arteries bilaterally. Disc levels: C2-C3: Negative interspace. Mild left-sided facet hypertrophy. No stenosis. C3-C4: Broad posterior disc osteophyte flattens the ventral thecal sac with resultant mild-to-moderate spinal stenosis. Superimposed uncovertebral and facet hypertrophy with resultant moderate left with mild right C4 foraminal stenosis. C4-C5: Mild disc bulge with uncovertebral spurring. Bilateral facet hypertrophy. No significant spinal stenosis. Moderate right with mild left C5 foraminal narrowing. C5-C6: Degenerative intervertebral disc space narrowing with diffuse disc osteophyte complex. Flattening and partial effacement of the ventral thecal sac, eccentric to the right. Mild  spinal stenosis. Severe right worse than left C6 foraminal narrowing. C6-C7: Disc bulge with uncovertebral spurring. Superimposed left paracentral disc protrusion indents the left ventral thecal sac. Mild spinal stenosis. Mild to moderate left with mild right C7 foraminal narrowing. C7-T1:  Normal interspace.  No canal or foraminal stenosis. MRI THORACIC SPINE FINDINGS Alignment: Physiologic with preservation of the normal thoracic kyphosis. No listhesis. Vertebrae: Diffusely abnormal  appearance of the visualized osseous structures, consistent with widespread osseous metastatic disease. Overall appearance is markedly improved as compared to previous exam, with decreased heterogeneous enhancement and STIR signal abnormality seen throughout the thoracic spine. Previously seen trace epidural tumor posterior to the T12 vertebral body is no longer visualized. No new extra osseous or epidural tumor identified. Vertebral body height maintained without pathologic or interval fracture. Cord: Normal signal and morphology. No epidural or intracanalicular tumor. No abnormal enhancement. Paraspinal and other soft tissues: Paraspinous soft tissues within normal limits. Splenomegaly partially visualized. Disc levels: No new significant disc pathology. No significant stenosis or neural impingement. MRI LUMBAR SPINE FINDINGS Segmentation: Standard. Lowest well-formed disc space labeled the L5-S1 level. Alignment: Physiologic with preservation of the normal lumbar lordosis. No listhesis. Vertebrae: Widespread osseous metastatic disease again seen throughout the visualized lumbar spine and pelvis. Overall, appearance is markedly improved with decreased heterogeneous enhancement seen throughout the affected structures as compared to prior. Vertebral body height maintained without interval or pathologic fracture. Previously seen epidural tumor posterior to the S1 vertebral body has largely resolved, and is no longer seen. Similarly, previously seen extraosseous extension from the bilateral ilium is also no longer clearly visualized. No significant extraosseous extension now seen. No significant epidural or intracanalicular involvement. Conus medullaris and cauda equina: Conus extends to the T12 level. Conus and cauda equina appear normal. Paraspinal and other soft tissues: Interval resolution in previously seen bulky retroperitoneal adenopathy. Disc levels: L1-2:  Unremarkable. L2-3:  Negative interspace.  Mild facet  hypertrophy.  No stenosis. L3-4: Disc bulge with mild facet hypertrophy. No spinal stenosis. Mild left L3 foraminal narrowing. L4-5: Degenerative intervertebral disc space narrowing with diffuse disc bulge and reactive endplate spurring. Mild to moderate facet hypertrophy. Resultant mild canal with moderate bilateral lateral recess stenosis. Moderate right with mild left L4 foraminal narrowing. L5-S1: Degenerative intervertebral disc space narrowing with diffuse disc bulge and reactive endplate spurring. Superimposed left subarticular disc protrusion contacts and mildly displaces the descending left S1 nerve root. Mild-to-moderate facet hypertrophy. Resultant moderate left with mild right lateral recess stenosis. Mild bilateral L5 foraminal narrowing. IMPRESSION: MRI CERVICAL SPINE IMPRESSION: 1. Interval improvement in appearance of widespread osseous metastatic disease, consistent with interval response to therapy. No pathologic fracture. Previously seen extraosseous/epidural tumor at C5-6 is no longer clearly visualized, with no extraosseous or epidural tumor now seen. 2. Underlying multilevel cervical spondylosis with resultant mild-to-moderate spinal stenosis at C3-4 through C6-7 as above. Moderate left C4 and right C5 foraminal stenosis, with severe bilateral C6 foraminal narrowing. MRI THORACIC SPINE IMPRESSION: Marked interval improvement in appearance of widespread osseous metastatic disease, consistent with interval response to therapy. No pathologic fracture. No extra osseous or epidural tumor identified. MRI LUMBAR SPINE IMPRESSION: 1. Interval improvement in widespread osseous metastatic disease involving the lumbar spine and visualized pelvis. No pathologic fracture. 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 No significant epidural or extraosseous disease now seen. 2. Interval improvement/resolution  in previously seen retroperitoneal adenopathy. 3. Left subarticular disc  protrusion at L5-S1, potentially affecting the left S1 nerve root. Additional more mild degenerative spondylosis elsewhere within the lumbar spine as above. Electronically Signed   By: Jeannine Boga M.D.   On: 12/20/2020 05:37   MR LIVER W WO CONTRAST  Result Date: 12/21/2020 CLINICAL DATA:  Characterize liver mass EXAM: MRI ABDOMEN WITHOUT AND WITH CONTRAST TECHNIQUE: Multiplanar multisequence MR imaging of the abdomen was performed both before and after the administration of intravenous contrast. CONTRAST:  87m GADAVIST GADOBUTROL 1 MMOL/ML IV SOLN COMPARISON:  CT abdomen pelvis, 12/10/2020 FINDINGS: Lower chest: No acute findings. Hepatobiliary: There is a large, contrast hypoenhancing mass of the inferior right lobe of the liver, hepatic segment VI, measuring 9.4 x 8.7 cm (series 21, image 50). This appears to occlude or at least compress the adjacent branch right portal veins, without definite evidence of tumor thrombus. Numerous tiny gallstones in the dependent gallbladder. No gallbladder wall thickening. No biliary ductal dilatation. Pancreas: No mass, inflammatory changes, or other parenchymal abnormality identified. No pancreatic ductal dilatation. Spleen:  Within normal limits in size and appearance. Adrenals/Urinary Tract: No masses identified. No evidence of hydronephrosis. Stomach/Bowel: Visualized portions within the abdomen are unremarkable. Vascular/Lymphatic: No pathologically enlarged lymph nodes identified. No abdominal aortic aneurysm demonstrated. Other:  None. Musculoskeletal: Diffusely heterogeneous, contrast enhancing vertebral bone marrow. IMPRESSION: 1. There is a large, contrast hypoenhancing mass of the inferior right lobe of the liver, hepatic segment VI, measuring 9.4 x 8.7 cm, which appears to occlude or at least compress the adjacent branch right portal veins, without definite evidence of tumor  thrombus. Appearance and contrast enhancement characteristics favor intrahepatic cholangiocarcinoma, although hepatocellular carcinoma is a differential consideration, particularly in the high risk setting of chronic hepatitis C. Prostate metastatic disease is not favored. LI-RADS category M. 2. 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 3. No lymphadenopathy or evidence of organ metastatic disease in the abdomen. 4.  Cholelithiasis without evidence of acute cholecystitis. Electronically Signed   By: AEddie CandleM.D.   On: 12/21/2020 07:49   ECHOCARDIOGRAM COMPLETE  Result Date: 12/19/2020    ECHOCARDIOGRAM REPORT   Patient Name:   GNUR KRALIKMANNING Date of Exam: 12/19/2020 Medical Rec #:  [id number]    Height:       62.0 in Accession #:    [id number]   Weight:       160.0 lb Date of Birth:  [birth year]    BSA:          1.739 m Patient Age:    89years      BP:           160/79 mmHg Patient Gender: M             HR:           57 bpm. Exam Location:  Inpatient Procedure: 2D Echo Indications:    chest pain  History:        Patient has no prior history of Echocardiogram examinations.                 Risk Factors:Hypertension and polysubstance abuse.  Sonographer:    LJohny ChessRDCS Referring Phys: [id number] [employer] 1. Left ventricular ejection fraction, by estimation, is 50 to 55%. The left ventricle has low normal function. The left ventricle demonstrates regional wall motion abnormalities with basal to mid inferior hypokinesis. Left ventricular diastolic parameters were normal.  2. Right ventricular  systolic function is normal. The right ventricular size is normal. Tricuspid regurgitation signal is inadequate for assessing PA pressure.  3. Left atrial size was mild to moderately dilated.  4. The mitral valve is normal in structure. No evidence of mitral valve regurgitation. No evidence of mitral stenosis.   5. The aortic valve is tricuspid. Aortic valve regurgitation is not visualized. No aortic stenosis is present.  6. The inferior vena cava is normal in size with greater than 50% respiratory variability, suggesting right atrial pressure of 3 mmHg. FINDINGS  Left Ventricle: Left ventricular ejection fraction, by estimation, is 50 to 55%. The left ventricle has low normal function. The left ventricle demonstrates regional wall motion abnormalities. The left ventricular internal cavity size was normal in size. There is no left ventricular hypertrophy. Left ventricular diastolic parameters were normal. Right Ventricle: The right ventricular size is normal. No increase in right ventricular wall thickness. Right ventricular systolic function is normal. Tricuspid regurgitation signal is inadequate for assessing PA pressure. Left Atrium: Left atrial size was mild to moderately dilated. Right Atrium: Right atrial size was normal in size. Pericardium: There is no evidence of pericardial effusion. Mitral Valve: The mitral valve is normal in structure. No evidence of mitral valve regurgitation. No evidence of mitral valve stenosis. Tricuspid Valve: The tricuspid valve is normal in structure. Tricuspid valve regurgitation is not demonstrated. Aortic Valve: The aortic valve is tricuspid. Aortic valve regurgitation is not visualized. No aortic stenosis is present. Pulmonic Valve: The pulmonic valve was normal in structure. Pulmonic valve regurgitation is not visualized. Aorta: The aortic root is normal in size and structure. Venous: The inferior vena cava is normal in size with greater than 50% respiratory variability, suggesting right atrial pressure of 3 mmHg. IAS/Shunts: No atrial level shunt detected by color flow Doppler.  LEFT VENTRICLE PLAX 2D LVIDd:         5.10 cm  Diastology LVIDs:         3.20 cm  LV e' medial:    8.92 cm/s LV PW:         0.90 cm  LV E/e' medial:  11.0 LV IVS:        0.80 cm  LV e' lateral:   11.20 cm/s  LVOT diam:     2.10 cm  LV E/e' lateral: 8.8 LV SV:         68 LV SV Index:   39 LVOT Area:     3.46 cm  RIGHT VENTRICLE             IVC RV S prime:     15.40 cm/s  IVC diam: 1.90 cm TAPSE (M-mode): 2.4 cm LEFT ATRIUM             Index       RIGHT ATRIUM           Index LA diam:        3.80 cm 2.19 cm/m  RA Area:     11.90 cm LA Vol (A2C):   75.9 ml 43.65 ml/m RA Volume:   26.70 ml  15.36 ml/m LA Vol (A4C):   69.1 ml 39.74 ml/m LA Biplane Vol: 75.3 ml 43.31 ml/m  AORTIC VALVE LVOT Vmax:   101.00 cm/s LVOT Vmean:  61.800 cm/s LVOT VTI:    0.197 m  AORTA Ao Root diam: 3.00 cm Ao Asc diam:  3.30 cm MITRAL VALVE MV Area (PHT): 3.00 cm    SHUNTS MV Decel Time: 253 msec  Systemic VTI:  0.20 m MV E velocity: 98.50 cm/s  Systemic Diam: 2.10 cm MV A velocity: 62.90 cm/s MV E/A ratio:  1.57 Dalton McleanMD Electronically signed by Franki Monte Signature Date/Time: 12/19/2020/5:32:35 PM    Final      Medical Consultants:   None.   Subjective:    [name] no complains  Objective:    Vitals:   12/20/20 2318 12/21/20 0326 12/21/20 0651 12/21/20 0746  BP: (!) 115/58 (!) 107/53  (!) 110/56  Pulse: (!) 52 (!) 53  (!) 55  Resp: '16 17  16  '$ Temp: 98.1 F (36.7 C) 98 F (36.7 C)  97.8 F (36.6 C)  TempSrc: Oral Oral  Oral  SpO2: 96% 100%  97%  Weight:   75.6 kg   Height:       SpO2: 97 %   Intake/Output Summary (Last 24 hours) at 12/21/2020 0858 Last data filed at 12/20/2020 2322 Gross per 24 hour  Intake 312.53 ml  Output 225 ml  Net 87.53 ml   Filed Weights   12/19/20 1144 12/19/20 1910 12/21/20 0651  Weight: 72.6 kg 74.8 kg 75.6 kg    Exam: General exam: In no acute distress. Respiratory system: Good air movement and clear to auscultation. Cardiovascular system: S1 & S2 heard, RRR. No JVD. Gastrointestinal system: Abdomen is nondistended, soft and nontender.  Extremities: No pedal edema. Skin: No rashes, lesions or ulcers Psychiatry: Judgement and insight appear normal.  Mood & affect appropriate.    Data Reviewed:    Labs: Basic Metabolic Panel: Recent Labs  Lab 12/19/20 1158 12/19/20 1648 12/21/20 0102  NA 138  --  136  K 3.8  --  4.1  CL 111  --  106  CO2 20*  --  21*  GLUCOSE 106*  --  110*  BUN 14  --  26*  CREATININE 0.71  --  0.91  CALCIUM 9.2  --  9.3  MG  --  2.0  --    GFR Estimated Creatinine Clearance: 81.7 mL/min (by C-G formula based on SCr of 0.91 mg/dL). Liver Function Tests: Recent Labs  Lab 12/19/20 1158 12/21/20 0102  AST 34 34  ALT 38 35  ALKPHOS 62 43  BILITOT 0.4 0.5  PROT 6.2* 5.9*  ALBUMIN 3.3* 3.3*   No results for input(s): LIPASE, AMYLASE in the last 168 hours. No results for input(s): AMMONIA in the last 168 hours. Coagulation profile Recent Labs  Lab 12/14/20 1003  INR 0.9   COVID-19 Labs  No results for input(s): DDIMER, FERRITIN, LDH, CRP in the last 72 hours.  Lab Results  Component Value Date   SARSCOV2NAA NEGATIVE 12/19/2020   [place] NEGATIVE 03/03/2020    CBC: Recent Labs  Lab 12/19/20 1158 12/21/20 0102  WBC 6.9 7.1  HGB 12.6* 11.8*  HCT 38.2* 33.9*  MCV 95.5 92.1  PLT 175 169   Cardiac Enzymes: No results for input(s): CKTOTAL, CKMB, CKMBINDEX, TROPONINI in the last 168 hours. BNP (last 3 results) No results for input(s): PROBNP in the last 8760 hours. CBG: No results for input(s): GLUCAP in the last 168 hours. D-Dimer: No results for input(s): DDIMER in the last 72 hours. Hgb A1c: Recent Labs    12/19/20 1648  HGBA1C 5.1   Lipid Profile: Recent Labs    12/19/20 1648  CHOL 201*  HDL 36*  LDLCALC 115*  TRIG 248*  CHOLHDL 5.6   Thyroid function studies: No results for input(s): TSH, T4TOTAL, T3FREE, THYROIDAB  in the last 72 hours.  Invalid input(s): FREET3 Anemia work up: No results for input(s): VITAMINB12, FOLATE, FERRITIN, TIBC, IRON, RETICCTPCT in the last 72 hours. Sepsis Labs: Recent Labs  Lab 12/19/20 1158 12/21/20 0102  WBC 6.9 7.1    Microbiology Recent Results (from the past 240 hour(s))  SARS CORONAVIRUS 2 (TAT 6-24 HRS) Nasopharyngeal Nasopharyngeal Swab     Status: None   Collection Time: 12/19/20  6:37 PM   Specimen: Nasopharyngeal Swab  Result Value Ref Range Status   SARS Coronavirus 2 NEGATIVE NEGATIVE Final    Comment: (NOTE) SARS-CoV-2 target nucleic acids are NOT DETECTED.  The SARS-CoV-2 RNA is generally detectable in upper and lower respiratory specimens during the acute phase of infection. Negative results do not preclude SARS-CoV-2 infection, do not rule out co-infections with other pathogens, and should not be used as the sole basis for treatment or other patient management decisions. Negative results must be combined with clinical observations, patient history, and epidemiological information. The expected result is Negative.  Fact Sheet for Patients: SugarRoll.be  Fact Sheet for Healthcare Providers: https://www.woods-mathews.com/  This test is not yet approved or cleared by the [place] FDA and  has been authorized for detection and/or diagnosis of SARS-CoV-2 by FDA under an Emergency Use Authorization (EUA). This EUA will remain  in effect (meaning this test can be used) for the duration of the COVID-19 declaration under Se ction 564(b)(1) of the Act, 21 U.S.C. section 360bbb-3(b)(1), unless the authorization is terminated or revoked sooner.  Performed at [facility], [place] [address], [place], [place] [address]      Medications:    abiraterone acetate  1,000 mg Oral Daily   aspirin EC  81 mg Oral Daily   isosorbide mononitrate  60 mg Oral Daily   predniSONE  5 mg Oral Q breakfast   sodium chloride flush  3 mL Intravenous Q12H   sodium chloride flush  3 mL Intravenous Q12H   tamsulosin  0.4 mg Oral Daily   Continuous Infusions:  sodium chloride     heparin 1,300 Units/hr (12/21/20 0250)      LOS: 1 day   Charlynne Cousins  [facility]  12/21/2020, 8:58 AM

## 2020-12-21 NOTE — Progress Notes (Signed)
ANTICOAGULATION CONSULT NOTE  Pharmacy Consult for Heparin Indication: chest pain/ACS  No Known Allergies  Patient Measurements: Height: '5\' 2"'$  (157.5 cm) Weight: 74.8 kg (164 lb 14.4 oz) IBW/kg (Calculated) : 54.6  Heparin Dosing Weight: 70.2 kg  Vital Signs: Temp: 98.1 F (36.7 C) (09/06 2318) Temp Source: Oral (09/06 2318) BP: 115/58 (09/06 2318) Pulse Rate: 52 (09/06 2318)  Labs: Recent Labs    12/19/20 1158 12/19/20 1428 12/20/20 2040 12/21/20 0102  HGB 12.6*  --   --  11.8*  HCT 38.2*  --   --  33.9*  PLT 175  --   --  169  HEPARINUNFRC  --   --  <0.10* <0.10*  CREATININE 0.71  --   --   --   TROPONINIHS 46* 97*  --   --      Estimated Creatinine Clearance: 92.5 mL/min (by C-G formula based on SCr of 0.71 mg/dL).   Assessment: 55 y.o. male presented on 9/05 for ongoing chest pain. Initially, patient was not started on heparin due to previous imaging with concerns of epidural space mass. Per Cardiology recommendations, OK to initiate IV heparin for ACS which will also allow ability to assess tolerance to anticoagulants. Possible LHC 9/7.  Initially not treated with bolus due to liver mass, hem/onc note states no contraindication for anticoagulation.  Heparin level remains undetectable on gtt at 1000 units/hr. No issues with line or bleeding reported per RN. Hgb down a bit to 11.8, plt 169.   Goal of Therapy:  Heparin level 0.3-0.7 units/ml Monitor platelets by anticoagulation protocol: Yes   Plan:  Bolus heparin 2500 units now Increase heparin infusion to 1300 units/hr F/u 6 hr heparin level vs f/u post cath  Sherlon Handing, PharmD, BCPS Please see amion for complete clinical pharmacist phone list 12/21/2020, 2:21 AM

## 2020-12-21 NOTE — Progress Notes (Signed)
Progress Note  Patient Name: Edgar Mooney Date of Encounter: 12/21/2020  [facility] Cardiologist: Dr Johnsie Cancel  Subjective   No CP or dyspnea  Inpatient Medications    Scheduled Meds:  abiraterone acetate  1,000 mg Oral Daily   aspirin EC  81 mg Oral Daily   isosorbide mononitrate  60 mg Oral Daily   predniSONE  5 mg Oral Q breakfast   sodium chloride flush  3 mL Intravenous Q12H   tamsulosin  0.4 mg Oral Daily   Continuous Infusions:  sodium chloride     heparin 1,300 Units/hr (12/21/20 0250)   PRN Meds: sodium chloride, acetaminophen, nitroGLYCERIN, ondansetron (ZOFRAN) IV, oxyCODONE-acetaminophen, sodium chloride flush   Vital Signs    Vitals:   12/20/20 2318 12/21/20 0326 12/21/20 0651 12/21/20 0746  BP: (!) 115/58 (!) 107/53  (!) 110/56  Pulse: (!) 52 (!) 53  (!) 55  Resp: '16 17  16  '$ Temp: 98.1 F (36.7 C) 98 F (36.7 C)  97.8 F (36.6 C)  TempSrc: Oral Oral  Oral  SpO2: 96% 100%  97%  Weight:   75.6 kg   Height:        Intake/Output Summary (Last 24 hours) at 12/21/2020 0753 Last data filed at 12/20/2020 2322 Gross per 24 hour  Intake 312.53 ml  Output 225 ml  Net 87.53 ml   Last 3 Weights 12/21/2020 12/19/2020 12/19/2020  Weight (lbs) 166 lb 10.7 oz 164 lb 14.4 oz 160 lb  Weight (kg) 75.6 kg 74.798 kg 72.576 kg  Some encounter information is confidential and restricted. Go to Review Flowsheets activity to see all data.      Telemetry    Sinus - Personally Reviewed  Physical Exam   GEN: No acute distress.   Neck: No JVD Cardiac: RRR, no murmurs, rubs, or gallops.  Respiratory: Clear to auscultation bilaterally. GI: Soft, nontender, non-distended  MS: No edema Neuro:  Nonfocal  Psych: Normal affect   Labs    High Sensitivity Troponin:   Recent Labs  Lab 12/19/20 1158 12/19/20 1428  TROPONINIHS 46* 97*      Chemistry Recent Labs  Lab 12/19/20 1158 12/21/20 0102  NA 138 136  K 3.8 4.1  CL 111 106  CO2 20* 21*  GLUCOSE 106* 110*   BUN 14 26*  CREATININE 0.71 0.91  CALCIUM 9.2 9.3  PROT 6.2* 5.9*  ALBUMIN 3.3* 3.3*  AST 34 34  ALT 38 35  ALKPHOS 62 43  BILITOT 0.4 0.5  GFRNONAA >60 >60  ANIONGAP 7 9     Hematology Recent Labs  Lab 12/19/20 1158 12/21/20 0102  WBC 6.9 7.1  RBC 4.00* 3.68*  HGB 12.6* 11.8*  HCT 38.2* 33.9*  MCV 95.5 92.1  MCH 31.5 32.1  MCHC 33.0 34.8  RDW 12.5 12.2  PLT 175 169    Radiology    DG Chest 2 View  Result Date: 12/19/2020 CLINICAL DATA:  Chest pain, diaphoresis EXAM: CHEST - 2 VIEW COMPARISON:  03/03/2020 FINDINGS: Lower lung volumes with similar diffuse bilateral interstitial opacities may represent chronic lung disease. Minor basilar atelectasis. Normal heart size and vascularity. No definite focal pneumonia, collapse or consolidation. Negative for effusion or pneumothorax. Trachea midline. Degenerative changes of the spine. IMPRESSION: Similar chronic interstitial lung changes with lower lung volumes. No definite superimposed acute process. Electronically Signed   By: [name].  Shick M.D.   On: 12/19/2020 12:45   MR Cervical Spine W or Wo Contrast  Result Date: 12/20/2020  CLINICAL DATA:  Initial evaluation for intermittent left-sided chest pain, history of metastatic prostate cancer. EXAM: MRI CERVICAL, THORACIC AND LUMBAR SPINE WITHOUT AND WITH CONTRAST TECHNIQUE: Multiplanar and multiecho pulse sequences of the cervical spine, to include the craniocervical junction and cervicothoracic junction, and thoracic and lumbar spine, were obtained without and with intravenous contrast. CONTRAST:  66m GADAVIST GADOBUTROL 1 MMOL/ML IV SOLN COMPARISON:  Prior MRIs from 03/04/2020. FINDINGS: MRI CERVICAL SPINE FINDINGS Alignment: Mild straightening of the normal cervical lordosis. No listhesis. Vertebrae: Diffuse infiltrative low marrow signal intensity again seen throughout the visualized skull base and spine. Previously seen superimposed enhancing lesions are markedly improved as compared to  previous exam, compatible with interval response to therapy. Appearance is most evident at the posterior arch of C2 where only a small focus of enhancement is now seen on today's exam, previously fairly prominent on prior MRI. No definite new or progressive lesions. Vertebral body height maintained without pathologic or interval fracture. Previously question extra osseous tumor on the right at C5-6 is not convincingly seen on today's exam. No other new extra osseous extension. No epidural or intracanalicular involvement identified. Cord: Normal signal and morphology. No abnormal enhancement or intracanalicular involvement of tumor. Posterior Fossa, vertebral arteries, paraspinal tissues: Empty sella partially visualized. Visualized portions of the brain and posterior fossa otherwise unremarkable. Paraspinous and prevertebral soft tissues within normal limits. Normal flow voids seen within the vertebral arteries bilaterally. Disc levels: C2-C3: Negative interspace. Mild left-sided facet hypertrophy. No stenosis. C3-C4: Broad posterior disc osteophyte flattens the ventral thecal sac with resultant mild-to-moderate spinal stenosis. Superimposed uncovertebral and facet hypertrophy with resultant moderate left with mild right C4 foraminal stenosis. C4-C5: Mild disc bulge with uncovertebral spurring. Bilateral facet hypertrophy. No significant spinal stenosis. Moderate right with mild left C5 foraminal narrowing. C5-C6: Degenerative intervertebral disc space narrowing with diffuse disc osteophyte complex. Flattening and partial effacement of the ventral thecal sac, eccentric to the right. Mild spinal stenosis. Severe right worse than left C6 foraminal narrowing. C6-C7: Disc bulge with uncovertebral spurring. Superimposed left paracentral disc protrusion indents the left ventral thecal sac. Mild spinal stenosis. Mild to moderate left with mild right C7 foraminal narrowing. C7-T1:  Normal interspace.  No canal or foraminal  stenosis. MRI THORACIC SPINE FINDINGS Alignment: Physiologic with preservation of the normal thoracic kyphosis. No listhesis. Vertebrae: Diffusely abnormal appearance of the visualized osseous structures, consistent with widespread osseous metastatic disease. Overall appearance is markedly improved as compared to previous exam, with decreased heterogeneous enhancement and STIR signal abnormality seen throughout the thoracic spine. Previously seen trace epidural tumor posterior to the T12 vertebral body is no longer visualized. No new extra osseous or epidural tumor identified. Vertebral body height maintained without pathologic or interval fracture. Cord: Normal signal and morphology. No epidural or intracanalicular tumor. No abnormal enhancement. Paraspinal and other soft tissues: Paraspinous soft tissues within normal limits. Splenomegaly partially visualized. Disc levels: No new significant disc pathology. No significant stenosis or neural impingement. MRI LUMBAR SPINE FINDINGS Segmentation: Standard. Lowest well-formed disc space labeled the L5-S1 level. Alignment: Physiologic with preservation of the normal lumbar lordosis. No listhesis. Vertebrae: Widespread osseous metastatic disease again seen throughout the visualized lumbar spine and pelvis. Overall, appearance is markedly improved with decreased heterogeneous enhancement seen throughout the affected structures as compared to prior. Vertebral body height maintained without interval or pathologic fracture. Previously seen epidural tumor posterior to the S1 vertebral body has largely resolved, and is no longer seen. Similarly, previously seen extraosseous extension from the  bilateral ilium is also no longer clearly visualized. No significant extraosseous extension now seen. No significant epidural or intracanalicular involvement. Conus medullaris and cauda equina: Conus extends to the T12 level. Conus and cauda equina appear normal. Paraspinal and other soft  tissues: Interval resolution in previously seen bulky retroperitoneal adenopathy. Disc levels: L1-2:  Unremarkable. L2-3:  Negative interspace.  Mild facet hypertrophy.  No stenosis. L3-4: Disc bulge with mild facet hypertrophy. No spinal stenosis. Mild left L3 foraminal narrowing. L4-5: Degenerative intervertebral disc space narrowing with diffuse disc bulge and reactive endplate spurring. Mild to moderate facet hypertrophy. Resultant mild canal with moderate bilateral lateral recess stenosis. Moderate right with mild left L4 foraminal narrowing. L5-S1: Degenerative intervertebral disc space narrowing with diffuse disc bulge and reactive endplate spurring. Superimposed left subarticular disc protrusion contacts and mildly displaces the descending left S1 nerve root. Mild-to-moderate facet hypertrophy. Resultant moderate left with mild right lateral recess stenosis. Mild bilateral L5 foraminal narrowing. IMPRESSION: MRI CERVICAL SPINE IMPRESSION: 1. Interval improvement in appearance of widespread osseous metastatic disease, consistent with interval response to therapy. No pathologic fracture. Previously seen extraosseous/epidural tumor at C5-6 is no longer clearly visualized, with no extraosseous or epidural tumor now seen. 2. Underlying multilevel cervical spondylosis with resultant mild-to-moderate spinal stenosis at C3-4 through C6-7 as above. Moderate left C4 and right C5 foraminal stenosis, with severe bilateral C6 foraminal narrowing. MRI THORACIC SPINE IMPRESSION: Marked interval improvement in appearance of widespread osseous metastatic disease, consistent with interval response to therapy. No pathologic fracture. No extra osseous or epidural tumor identified. MRI LUMBAR SPINE IMPRESSION: 1. Interval improvement in widespread osseous metastatic disease involving the lumbar spine and visualized pelvis. No pathologic fracture. Previously seen epidural tumor at the level of S1 has resolved as has the  previously seen extraosseous tumor extension from the bilateral ilium. No significant epidural or extraosseous disease now seen. 2. Interval improvement/resolution in previously seen retroperitoneal adenopathy. 3. Left subarticular disc protrusion at L5-S1, potentially affecting the left S1 nerve root. Additional more mild degenerative spondylosis elsewhere within the lumbar spine as above. Electronically Signed   By: Jeannine Boga M.D.   On: 12/20/2020 05:37   MR THORACIC SPINE W WO CONTRAST  Result Date: 12/20/2020 CLINICAL DATA:  Initial evaluation for intermittent left-sided chest pain, history of metastatic prostate cancer. EXAM: MRI CERVICAL, THORACIC AND LUMBAR SPINE WITHOUT AND WITH CONTRAST TECHNIQUE: Multiplanar and multiecho pulse sequences of the cervical spine, to include the craniocervical junction and cervicothoracic junction, and thoracic and lumbar spine, were obtained without and with intravenous contrast. CONTRAST:  9m GADAVIST GADOBUTROL 1 MMOL/ML IV SOLN COMPARISON:  Prior MRIs from 03/04/2020. FINDINGS: MRI CERVICAL SPINE FINDINGS Alignment: Mild straightening of the normal cervical lordosis. No listhesis. Vertebrae: Diffuse infiltrative low marrow signal intensity again seen throughout the visualized skull base and spine. Previously seen superimposed enhancing lesions are markedly improved as compared to previous exam, compatible with interval response to therapy. Appearance is most evident at the posterior arch of C2 where only a small focus of enhancement is now seen on today's exam, previously fairly prominent on prior MRI. No definite new or progressive lesions. Vertebral body height maintained without pathologic or interval fracture. Previously question extra osseous tumor on the right at C5-6 is not convincingly seen on today's exam. No other new extra osseous extension. No epidural or intracanalicular involvement identified. Cord: Normal signal and morphology. No abnormal  enhancement or intracanalicular involvement of tumor. Posterior Fossa, vertebral arteries, paraspinal tissues: Empty sella partially visualized. Visualized  portions of the brain and posterior fossa otherwise unremarkable. Paraspinous and prevertebral soft tissues within normal limits. Normal flow voids seen within the vertebral arteries bilaterally. Disc levels: C2-C3: Negative interspace. Mild left-sided facet hypertrophy. No stenosis. C3-C4: Broad posterior disc osteophyte flattens the ventral thecal sac with resultant mild-to-moderate spinal stenosis. Superimposed uncovertebral and facet hypertrophy with resultant moderate left with mild right C4 foraminal stenosis. C4-C5: Mild disc bulge with uncovertebral spurring. Bilateral facet hypertrophy. No significant spinal stenosis. Moderate right with mild left C5 foraminal narrowing. C5-C6: Degenerative intervertebral disc space narrowing with diffuse disc osteophyte complex. Flattening and partial effacement of the ventral thecal sac, eccentric to the right. Mild spinal stenosis. Severe right worse than left C6 foraminal narrowing. C6-C7: Disc bulge with uncovertebral spurring. Superimposed left paracentral disc protrusion indents the left ventral thecal sac. Mild spinal stenosis. Mild to moderate left with mild right C7 foraminal narrowing. C7-T1:  Normal interspace.  No canal or foraminal stenosis. MRI THORACIC SPINE FINDINGS Alignment: Physiologic with preservation of the normal thoracic kyphosis. No listhesis. Vertebrae: Diffusely abnormal appearance of the visualized osseous structures, consistent with widespread osseous metastatic disease. Overall appearance is markedly improved as compared to previous exam, with decreased heterogeneous enhancement and STIR signal abnormality seen throughout the thoracic spine. Previously seen trace epidural tumor posterior to the T12 vertebral body is no longer visualized. No new extra osseous or epidural tumor identified.  Vertebral body height maintained without pathologic or interval fracture. Cord: Normal signal and morphology. No epidural or intracanalicular tumor. No abnormal enhancement. Paraspinal and other soft tissues: Paraspinous soft tissues within normal limits. Splenomegaly partially visualized. Disc levels: No new significant disc pathology. No significant stenosis or neural impingement. MRI LUMBAR SPINE FINDINGS Segmentation: Standard. Lowest well-formed disc space labeled the L5-S1 level. Alignment: Physiologic with preservation of the normal lumbar lordosis. No listhesis. Vertebrae: Widespread osseous metastatic disease again seen throughout the visualized lumbar spine and pelvis. Overall, appearance is markedly improved with decreased heterogeneous enhancement seen throughout the affected structures as compared to prior. Vertebral body height maintained without interval or pathologic fracture. Previously seen epidural tumor posterior to the S1 vertebral body has largely resolved, and is no longer seen. Similarly, previously seen extraosseous extension from the bilateral ilium is also no longer clearly visualized. No significant extraosseous extension now seen. No significant epidural or intracanalicular involvement. Conus medullaris and cauda equina: Conus extends to the T12 level. Conus and cauda equina appear normal. Paraspinal and other soft tissues: Interval resolution in previously seen bulky retroperitoneal adenopathy. Disc levels: L1-2:  Unremarkable. L2-3:  Negative interspace.  Mild facet hypertrophy.  No stenosis. L3-4: Disc bulge with mild facet hypertrophy. No spinal stenosis. Mild left L3 foraminal narrowing. L4-5: Degenerative intervertebral disc space narrowing with diffuse disc bulge and reactive endplate spurring. Mild to moderate facet hypertrophy. Resultant mild canal with moderate bilateral lateral recess stenosis. Moderate right with mild left L4 foraminal narrowing. L5-S1: Degenerative  intervertebral disc space narrowing with diffuse disc bulge and reactive endplate spurring. Superimposed left subarticular disc protrusion contacts and mildly displaces the descending left S1 nerve root. Mild-to-moderate facet hypertrophy. Resultant moderate left with mild right lateral recess stenosis. Mild bilateral L5 foraminal narrowing. IMPRESSION: MRI CERVICAL SPINE IMPRESSION: 1. Interval improvement in appearance of widespread osseous metastatic disease, consistent with interval response to therapy. No pathologic fracture. Previously seen extraosseous/epidural tumor at C5-6 is no longer clearly visualized, with no extraosseous or epidural tumor now seen. 2. Underlying multilevel cervical spondylosis with resultant mild-to-moderate spinal stenosis at C3-4  through C6-7 as above. Moderate left C4 and right C5 foraminal stenosis, with severe bilateral C6 foraminal narrowing. MRI THORACIC SPINE IMPRESSION: Marked interval improvement in appearance of widespread osseous metastatic disease, consistent with interval response to therapy. No pathologic fracture. No extra osseous or epidural tumor identified. MRI LUMBAR SPINE IMPRESSION: 1. Interval improvement in widespread osseous metastatic disease involving the lumbar spine and visualized pelvis. No pathologic fracture. 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 No significant epidural or extraosseous disease now seen. 2. Interval improvement/resolution in previously seen retroperitoneal adenopathy. 3. Left subarticular disc protrusion at L5-S1, potentially affecting the left S1 nerve root. Additional more mild degenerative spondylosis elsewhere within the lumbar spine as above. Electronically Signed   By: Jeannine Boga M.D.   On: 12/20/2020 05:37   MR Lumbar Spine W Wo Contrast  Result Date: 12/20/2020 CLINICAL DATA:  Initial evaluation for intermittent  left-sided chest pain, history of metastatic prostate cancer. EXAM: MRI CERVICAL, THORACIC AND LUMBAR SPINE WITHOUT AND WITH CONTRAST TECHNIQUE: Multiplanar and multiecho pulse sequences of the cervical spine, to include the craniocervical junction and cervicothoracic junction, and thoracic and lumbar spine, were obtained without and with intravenous contrast. CONTRAST:  45m GADAVIST GADOBUTROL 1 MMOL/ML IV SOLN COMPARISON:  Prior MRIs from 03/04/2020. FINDINGS: MRI CERVICAL SPINE FINDINGS Alignment: Mild straightening of the normal cervical lordosis. No listhesis. Vertebrae: Diffuse infiltrative low marrow signal intensity again seen throughout the visualized skull base and spine. Previously seen superimposed enhancing lesions are markedly improved as compared to previous exam, compatible with interval response to therapy. Appearance is most evident at the posterior arch of C2 where only a small focus of enhancement is now seen on today's exam, previously fairly prominent on prior MRI. No definite new or progressive lesions. Vertebral body height maintained without pathologic or interval fracture. Previously question extra osseous tumor on the right at C5-6 is not convincingly seen on today's exam. No other new extra osseous extension. No epidural or intracanalicular involvement identified. Cord: Normal signal and morphology. No abnormal enhancement or intracanalicular involvement of tumor. Posterior Fossa, vertebral arteries, paraspinal tissues: Empty sella partially visualized. Visualized portions of the brain and posterior fossa otherwise unremarkable. Paraspinous and prevertebral soft tissues within normal limits. Normal flow voids seen within the vertebral arteries bilaterally. Disc levels: C2-C3: Negative interspace. Mild left-sided facet hypertrophy. No stenosis. C3-C4: Broad posterior disc osteophyte flattens the ventral thecal sac with resultant mild-to-moderate spinal stenosis. Superimposed uncovertebral  and facet hypertrophy with resultant moderate left with mild right C4 foraminal stenosis. C4-C5: Mild disc bulge with uncovertebral spurring. Bilateral facet hypertrophy. No significant spinal stenosis. Moderate right with mild left C5 foraminal narrowing. C5-C6: Degenerative intervertebral disc space narrowing with diffuse disc osteophyte complex. Flattening and partial effacement of the ventral thecal sac, eccentric to the right. Mild spinal stenosis. Severe right worse than left C6 foraminal narrowing. C6-C7: Disc bulge with uncovertebral spurring. Superimposed left paracentral disc protrusion indents the left ventral thecal sac. Mild spinal stenosis. Mild to moderate left with mild right C7 foraminal narrowing. C7-T1:  Normal interspace.  No canal or foraminal stenosis. MRI THORACIC SPINE FINDINGS Alignment: Physiologic with preservation of the normal thoracic kyphosis. No listhesis. Vertebrae: Diffusely abnormal appearance of the visualized osseous structures, consistent with widespread osseous metastatic disease. Overall appearance is markedly improved as compared to previous exam, with decreased heterogeneous enhancement and STIR signal abnormality seen throughout the thoracic spine. Previously seen  trace epidural tumor posterior to the T12 vertebral body is no longer visualized. No new extra osseous or epidural tumor identified. Vertebral body height maintained without pathologic or interval fracture. Cord: Normal signal and morphology. No epidural or intracanalicular tumor. No abnormal enhancement. Paraspinal and other soft tissues: Paraspinous soft tissues within normal limits. Splenomegaly partially visualized. Disc levels: No new significant disc pathology. No significant stenosis or neural impingement. MRI LUMBAR SPINE FINDINGS Segmentation: Standard. Lowest well-formed disc space labeled the L5-S1 level. Alignment: Physiologic with preservation of the normal lumbar lordosis. No listhesis. Vertebrae:  Widespread osseous metastatic disease again seen throughout the visualized lumbar spine and pelvis. Overall, appearance is markedly improved with decreased heterogeneous enhancement seen throughout the affected structures as compared to prior. Vertebral body height maintained without interval or pathologic fracture. Previously seen epidural tumor posterior to the S1 vertebral body has largely resolved, and is no longer seen. Similarly, previously seen extraosseous extension from the bilateral ilium is also no longer clearly visualized. No significant extraosseous extension now seen. No significant epidural or intracanalicular involvement. Conus medullaris and cauda equina: Conus extends to the T12 level. Conus and cauda equina appear normal. Paraspinal and other soft tissues: Interval resolution in previously seen bulky retroperitoneal adenopathy. Disc levels: L1-2:  Unremarkable. L2-3:  Negative interspace.  Mild facet hypertrophy.  No stenosis. L3-4: Disc bulge with mild facet hypertrophy. No spinal stenosis. Mild left L3 foraminal narrowing. L4-5: Degenerative intervertebral disc space narrowing with diffuse disc bulge and reactive endplate spurring. Mild to moderate facet hypertrophy. Resultant mild canal with moderate bilateral lateral recess stenosis. Moderate right with mild left L4 foraminal narrowing. L5-S1: Degenerative intervertebral disc space narrowing with diffuse disc bulge and reactive endplate spurring. Superimposed left subarticular disc protrusion contacts and mildly displaces the descending left S1 nerve root. Mild-to-moderate facet hypertrophy. Resultant moderate left with mild right lateral recess stenosis. Mild bilateral L5 foraminal narrowing. IMPRESSION: MRI CERVICAL SPINE IMPRESSION: 1. Interval improvement in appearance of widespread osseous metastatic disease, consistent with interval response to therapy. No pathologic fracture. Previously seen extraosseous/epidural tumor at C5-6 is no  longer clearly visualized, with no extraosseous or epidural tumor now seen. 2. Underlying multilevel cervical spondylosis with resultant mild-to-moderate spinal stenosis at C3-4 through C6-7 as above. Moderate left C4 and right C5 foraminal stenosis, with severe bilateral C6 foraminal narrowing. MRI THORACIC SPINE IMPRESSION: Marked interval improvement in appearance of widespread osseous metastatic disease, consistent with interval response to therapy. No pathologic fracture. No extra osseous or epidural tumor identified. MRI LUMBAR SPINE IMPRESSION: 1. Interval improvement in widespread osseous metastatic disease involving the lumbar spine and visualized pelvis. No pathologic fracture. 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 No significant epidural or extraosseous disease now seen. 2. Interval improvement/resolution in previously seen retroperitoneal adenopathy. 3. Left subarticular disc protrusion at L5-S1, potentially affecting the left S1 nerve root. Additional more mild degenerative spondylosis elsewhere within the lumbar spine as above. Electronically Signed   By: Jeannine Boga M.D.   On: 12/20/2020 05:37   ECHOCARDIOGRAM COMPLETE  Result Date: 12/19/2020    ECHOCARDIOGRAM REPORT   Patient Name:   CORYE JEANNOT Clinkscale Date of Exam: 12/19/2020 Medical Rec #:  [id number]     Height:       62.0 in Accession #:    [id number]    Weight:       160.0 lb Date of Birth:  [birth year]  BSA:          1.739 m Patient Age:    38 years      BP:           160/79 mmHg Patient Gender: M             HR:           57 bpm. Exam Location:  Inpatient Procedure: 2D Echo Indications:    chest pain  History:        Patient has no prior history of Echocardiogram examinations.                 Risk Factors:Hypertension and polysubstance abuse.  Sonographer:    Johny Chess RDCS Referring Phys: [id number] [place]   1. Left ventricular ejection fraction, by estimation, is 50 to 55%. The left ventricle has low normal function. The left ventricle demonstrates regional wall motion abnormalities with basal to mid inferior hypokinesis. Left ventricular diastolic parameters were normal.  2. Right ventricular systolic function is normal. The right ventricular size is normal. Tricuspid regurgitation signal is inadequate for assessing PA pressure.  3. Left atrial size was mild to moderately dilated.  4. The mitral valve is normal in structure. No evidence of mitral valve regurgitation. No evidence of mitral stenosis.  5. The aortic valve is tricuspid. Aortic valve regurgitation is not visualized. No aortic stenosis is present.  6. The inferior vena cava is normal in size with greater than 50% respiratory variability, suggesting right atrial pressure of 3 mmHg. FINDINGS  Left Ventricle: Left ventricular ejection fraction, by estimation, is 50 to 55%. The left ventricle has low normal function. The left ventricle demonstrates regional wall motion abnormalities. The left ventricular internal cavity size was normal in size. There is no left ventricular hypertrophy. Left ventricular diastolic parameters were normal. Right Ventricle: The right ventricular size is normal. No increase in right ventricular wall thickness. Right ventricular systolic function is normal. Tricuspid regurgitation signal is inadequate for assessing PA pressure. Left Atrium: Left atrial size was mild to moderately dilated. Right Atrium: Right atrial size was normal in size. Pericardium: There is no evidence of pericardial effusion. Mitral Valve: The mitral valve is normal in structure. No evidence of mitral valve regurgitation. No evidence of mitral valve stenosis. Tricuspid Valve: The tricuspid valve is normal in structure. Tricuspid valve regurgitation is not demonstrated. Aortic Valve: The aortic valve is tricuspid. Aortic valve regurgitation is not visualized. No  aortic stenosis is present. Pulmonic Valve: The pulmonic valve was normal in structure. Pulmonic valve regurgitation is not visualized. Aorta: The aortic root is normal in size and structure. Venous: The inferior vena cava is normal in size with greater than 50% respiratory variability, suggesting right atrial pressure of 3 mmHg. IAS/Shunts: No atrial level shunt detected by color flow Doppler.  LEFT VENTRICLE PLAX 2D LVIDd:         5.10 cm  Diastology LVIDs:         3.20 cm  LV e' medial:    8.92 cm/s LV PW:         0.90 cm  LV E/e' medial:  11.0 LV IVS:        0.80 cm  LV e' lateral:   11.20 cm/s LVOT diam:     2.10 cm  LV E/e' lateral: 8.8 LV SV:         68 LV SV Index:   39 LVOT Area:     3.46 cm  RIGHT VENTRICLE  IVC RV S prime:     15.40 cm/s  IVC diam: 1.90 cm TAPSE (M-mode): 2.4 cm LEFT ATRIUM             Index       RIGHT ATRIUM           Index LA diam:        3.80 cm 2.19 cm/m  RA Area:     11.90 cm LA Vol (A2C):   75.9 ml 43.65 ml/m RA Volume:   26.70 ml  15.36 ml/m LA Vol (A4C):   69.1 ml 39.74 ml/m LA Biplane Vol: 75.3 ml 43.31 ml/m  AORTIC VALVE LVOT Vmax:   101.00 cm/s LVOT Vmean:  61.800 cm/s LVOT VTI:    0.197 m  AORTA Ao Root diam: 3.00 cm Ao Asc diam:  3.30 cm MITRAL VALVE MV Area (PHT): 3.00 cm    SHUNTS MV Decel Time: 253 msec    Systemic VTI:  0.20 m MV E velocity: 98.50 cm/s  Systemic Diam: 2.10 cm MV A velocity: 62.90 cm/s MV E/A ratio:  1.57 Dalton McleanMD Electronically signed by Franki Monte Signature Date/Time: 12/19/2020/5:32:35 PM    Final      Patient Profile     55 y.o. male with past medical history of metastatic prostate cancer, hepatitis C, substance abuse, probable newly diagnosed hepatocellular carcinoma admitted with unstable angina/non-ST elevation myocardial infarction.  Echocardiogram shows preserved LV function with inferior hypokinesis and mild to moderate left atrial enlargement.  Assessment & Plan    1 non-ST elevation myocardial  infarction-patient remains pain-free.  He has had progressive chest pain now occurring at rest and his troponins are abnormal.  He has been seen by oncology and cleared for anticoagulation.  Continue aspirin, heparin and isosorbide.  He is not on a beta-blocker as he is somewhat bradycardic and also has drug screen positive for cocaine.  I have not added a statin given his potential liver disease/cirrhosis.  We will proceed with cardiac catheterization given the progressive nature of his symptoms.  He clearly is not a candidate for surgical intervention given his other comorbidities.  However he would be a candidate for PCI.  The risk and benefits of cardiac catheterization including myocardial infarction, CVA and death discussed and he agrees to proceed.  2 metastatic prostate cancer-response to therapy noted on recent MRI.  Management per oncology.  3 liver mass-concern is newly diagnosed hepatocellular carcinoma.  He will need follow-up with oncology after discharge.  4 substance abuse-patient counseled on avoiding.  For questions or updates, please contact Champ Please consult [email or web address] for contact info under        Signed, Kirk Ruths, MD  12/21/2020, 7:53 AM

## 2020-12-22 ENCOUNTER — Encounter (HOSPITAL_COMMUNITY)
Admission: EM | Disposition: A | Discharge status: Home / Self Care | Payer: Self-pay | Source: Home / Self Care | Attending: Internal Medicine

## 2020-12-22 ENCOUNTER — Other Ambulatory Visit (HOSPITAL_COMMUNITY): Payer: Self-pay

## 2020-12-22 ENCOUNTER — Encounter (HOSPITAL_COMMUNITY): Payer: Self-pay | Admitting: Family Medicine

## 2020-12-22 HISTORY — PX: CORONARY STENT INTERVENTION: CATH118234

## 2020-12-22 LAB — BASIC METABOLIC PANEL
Anion gap: 8 (ref 5–15)
BUN: 20 mg/dL (ref 6–20)
CO2: 22 mmol/L (ref 22–32)
Calcium: 9.3 mg/dL (ref 8.9–10.3)
Chloride: 108 mmol/L (ref 98–111)
Creatinine, Ser: 0.84 mg/dL (ref 0.61–1.24)
GFR, Estimated: >60 mL/min (ref >60)
Glucose, Bld: 109 mg/dL — ABNORMAL HIGH (ref 70–99)
Potassium: 4.1 mmol/L (ref 3.5–5.1)
Sodium: 138 mmol/L (ref 135–145)

## 2020-12-22 LAB — CBC
HCT: 34.2 % — ABNORMAL LOW (ref 39.0–52.0)
Hemoglobin: 11.7 g/dL — ABNORMAL LOW (ref 13.0–17.0)
MCH: 31.7 pg (ref 26.0–34.0)
MCHC: 34.2 g/dL (ref 30.0–36.0)
MCV: 92.7 fL (ref 80.0–100.0)
Platelets: 167 10*3/uL (ref 150–400)
RBC: 3.69 MIL/uL — ABNORMAL LOW (ref 4.22–5.81)
RDW: 12.6 % (ref 11.5–15.5)
WBC: 6.9 10*3/uL (ref 4.0–10.5)
nRBC: 0 % (ref 0.0–0.2)

## 2020-12-22 LAB — HEPARIN LEVEL (UNFRACTIONATED): Heparin Unfractionated: 0.16 IU/mL — ABNORMAL LOW (ref 0.30–0.70)

## 2020-12-22 SURGERY — CORONARY STENT INTERVENTION
Anesthesia: LOCAL

## 2020-12-22 MED ORDER — HEPARIN (PORCINE) IN NACL 1000-0.9 UT/500ML-% IV SOLN
INTRAVENOUS | Status: DC | PRN
  Administered 2020-12-22 (×2): 500 mL

## 2020-12-22 MED ORDER — HEPARIN SODIUM (PORCINE) 1000 UNIT/ML IJ SOLN
INTRAMUSCULAR | Status: DC | PRN
  Administered 2020-12-22: 10000 [IU] via INTRAVENOUS

## 2020-12-22 MED ORDER — HEPARIN (PORCINE) IN NACL 1000-0.9 UT/500ML-% IV SOLN
INTRAVENOUS | Status: AC
  Filled 2020-12-22: qty 500

## 2020-12-22 MED ORDER — SODIUM CHLORIDE 0.9 % IV SOLN: INTRAVENOUS | Status: AC

## 2020-12-22 MED ORDER — SODIUM CHLORIDE 0.9 % IV SOLN: INTRAVENOUS | Status: DC

## 2020-12-22 MED ORDER — MIDAZOLAM HCL 2 MG/2ML IJ SOLN
INTRAMUSCULAR | Status: DC | PRN
  Administered 2020-12-22: 1 mg via INTRAVENOUS
  Administered 2020-12-22: 2 mg via INTRAVENOUS
  Administered 2020-12-22: 1 mg via INTRAVENOUS

## 2020-12-22 MED ORDER — IOHEXOL 350 MG/ML SOLN
INTRAVENOUS | Status: DC | PRN
  Administered 2020-12-22: 80 mL

## 2020-12-22 MED ORDER — FENTANYL CITRATE (PF) 100 MCG/2ML IJ SOLN
INTRAMUSCULAR | Status: AC
  Filled 2020-12-22: qty 2

## 2020-12-22 MED ORDER — LIDOCAINE HCL (PF) 1 % IJ SOLN
INTRAMUSCULAR | Status: DC | PRN
  Administered 2020-12-22: 2 mL

## 2020-12-22 MED ORDER — VERAPAMIL HCL 2.5 MG/ML IV SOLN
INTRAVENOUS | Status: DC | PRN
  Administered 2020-12-22: 10 mL via INTRA_ARTERIAL

## 2020-12-22 MED ORDER — SODIUM CHLORIDE 0.9% FLUSH
3.0000 mL | Freq: Two times a day (BID) | INTRAVENOUS | Status: DC
  Administered 2020-12-23: 3 mL via INTRAVENOUS

## 2020-12-22 MED ORDER — FENTANYL CITRATE (PF) 100 MCG/2ML IJ SOLN
INTRAMUSCULAR | Status: DC | PRN
  Administered 2020-12-22: 25 ug via INTRAVENOUS
  Administered 2020-12-22: 50 ug via INTRAVENOUS

## 2020-12-22 MED ORDER — LABETALOL HCL 5 MG/ML IV SOLN: 10.0000 mg | INTRAVENOUS | Status: AC | PRN

## 2020-12-22 MED ORDER — TICAGRELOR 90 MG PO TABS: ORAL_TABLET | ORAL | Status: DC | PRN

## 2020-12-22 MED ORDER — HYDRALAZINE HCL 20 MG/ML IJ SOLN: 10.0000 mg | INTRAMUSCULAR | Status: AC | PRN

## 2020-12-22 MED ORDER — NITROGLYCERIN 1 MG/10 ML FOR IR/CATH LAB
INTRA_ARTERIAL | Status: AC
  Filled 2020-12-22: qty 10

## 2020-12-22 MED ORDER — HEPARIN SODIUM (PORCINE) 1000 UNIT/ML IJ SOLN
INTRAMUSCULAR | Status: AC
  Filled 2020-12-22: qty 1

## 2020-12-22 MED ORDER — SODIUM CHLORIDE 0.9 % IV SOLN: 250.0000 mL | INTRAVENOUS | Status: DC | PRN

## 2020-12-22 MED ORDER — MIDAZOLAM HCL 2 MG/2ML IJ SOLN
INTRAMUSCULAR | Status: AC
  Filled 2020-12-22: qty 2

## 2020-12-22 MED ORDER — VERAPAMIL HCL 2.5 MG/ML IV SOLN
INTRAVENOUS | Status: AC
  Filled 2020-12-22: qty 2

## 2020-12-22 MED ORDER — TICAGRELOR 90 MG PO TABS
ORAL_TABLET | ORAL | Status: AC
  Filled 2020-12-22: qty 1

## 2020-12-22 MED ORDER — INFLUENZA VAC SPLIT QUAD 0.5 ML IM SUSY: 0.5000 mL | PREFILLED_SYRINGE | INTRAMUSCULAR | Status: DC

## 2020-12-22 MED ORDER — SODIUM CHLORIDE 0.9% FLUSH: 3.0000 mL | INTRAVENOUS | Status: DC | PRN

## 2020-12-22 SURGICAL SUPPLY — 16 items
BALL SAPPHIRE NC24 3.25X26 (BALLOONS) ×2
BALLN SAPPHIRE 2.0X20 (BALLOONS) ×2
BALLOON SAPPHIRE 2.0X20 (BALLOONS) IMPLANT
BALLOON SAPPHIRE NC24 3.25X26 (BALLOONS) IMPLANT
CATH VISTA GUIDE 6FR JR4 (CATHETERS) ×1 IMPLANT
ELECT DEFIB PAD ADLT CADENCE (PAD) ×1 IMPLANT
GLIDESHEATH SLEND SS 6F .021 (SHEATH) ×1 IMPLANT
GUIDEWIRE INQWIRE 1.5J.035X260 (WIRE) IMPLANT
INQWIRE 1.5J .035X260CM (WIRE) ×2
KIT ENCORE 26 ADVANTAGE (KITS) ×1 IMPLANT
KIT HEART LEFT (KITS) ×2 IMPLANT
PACK CARDIAC CATHETERIZATION (CUSTOM PROCEDURE TRAY) ×2 IMPLANT
STENT ONYX FRONTIER 3.0X38 (Permanent Stent) ×1 IMPLANT
TRANSDUCER W/STOPCOCK (MISCELLANEOUS) ×2 IMPLANT
TUBING CIL FLEX 10 FLL-RA (TUBING) ×2 IMPLANT
WIRE COUGAR XT STRL 190CM (WIRE) ×1 IMPLANT

## 2020-12-22 NOTE — Interval H&P Note (Signed)
History and Physical Interval Note:  12/22/2020 9:04 AM  [name]  has presented today for surgery, with the diagnosis of cad.  The various methods of treatment have been discussed with the patient and family. After consideration of risks, benefits and other options for treatment, the patient has consented to  Procedure(s): CORONARY STENT INTERVENTION (N/A) as a surgical intervention.  The patient's history has been reviewed, patient examined, no change in status, stable for surgery.  I have reviewed the patient's chart and labs.  Questions were answered to the patient's satisfaction.    Cath Lab Visit (complete for each Cath Lab visit)  Clinical Evaluation Leading to the Procedure:   ACS: Yes.    Non-ACS:    Anginal Classification: CCS III  Anti-ischemic medical therapy: Minimal Therapy (1 class of medications)  Non-Invasive Test Results: No non-invasive testing performed  Prior CABG: No previous CABG   Staged PCI of the RCA. He was admitted with ACS yesterday, LAD PCI.      [name]

## 2020-12-22 NOTE — Progress Notes (Signed)
[facility] PROGRESS NOTE    Progress Note  Edgar Mooney  [id number] DOB: [birth year] DOA: 12/19/2020 PCP: Patient, No Pcp Per (Inactive)     Brief Narrative:   Edgar Mooney is an 55 y.o. male past medical history significant for CAD, alcohol abuse, chronic hepatitis C, polysubstance abuse including cocaine with metastatic prostate cancer to the bone presents to the [facility], ED on 12/19/2020 for chest pain substernal associated with difficulty breathing.  Cardiology was consulted due to concern of unstable angina    Assessment/Plan:   Unstable angina [facility]): Cardiology was consulted, Continue aspirin, statin and isosorbide not on a beta-blocker in the setting of cocaine use. Continue IV heparin. Cardiology recommended cardiac cath coronary artery disease normal prominent severe proximal and mid RCA and successful PCI, they recommended DAPT therapy for 12 months then discontinue aspirin.  Metastatic prostate cancer with osseous metastasis: Oncologist [name] was consulted.  With a new liver mass alpha-fetoprotein was elevated significantly so there is a concern hepatocellular carcinoma. Oncology recommended no contraindication to proceeding with anticoagulation if needed from a cardiac standpoint.  Liver mass: MRI of the liver appearance with contrast-enhancement characteristic favors intrahepatic cholangiocarcinoma although hepatocellular carcinoma is also in the differential.  There is no definite thrombus within the right portal vein. There is diffuse enhancing of the vertebral bone compatible with his known metastatic malignancy.  Essential hypertension: Continue nitrates with beta-blockers in the setting of cocaine abuse. Blood pressure appears to be relatively well controlled.  Cocaine abuse: Not a candidate for beta-blocker at this point, his UDS was positive for cocaine. He has been counseled.  Alcohol abuse: Counseled.  History of gout: Not taking on  [place] anymore.  Chronic hepatitis C: Status posttreatment in the past.    DVT prophylaxis: heparin Family Communication:none Status is: Inpatient  Remains inpatient appropriate because:Hemodynamically unstable  Dispo: The patient is from: Home              Anticipated d/c is to: Home              Patient currently is not medically stable to d/c.   Difficult to place patient No  Code Status:     Code Status Orders  (From admission, onward)           Start     Ordered   12/19/20 1701  Full code  Continuous        12/19/20 1701           Code Status History     Date Active Date Inactive Code Status Order ID Comments User Context   03/03/2020 1347 03/08/2020 1640 Full Code [id number]  [name], MD ED   10/25/2017 0336 10/26/2017 0333 Full Code [id number]  [name], MD ED   04/03/2016 0531 04/03/2016 1612 Full Code [id number]  [name], MD ED   01/14/2016 2003 01/16/2016 2034 Full Code [id number]  [name], MD Inpatient   01/05/2016 1451 01/11/2016 1202 Full Code [id number]  [name], NP Inpatient   01/04/2016 1944 01/05/2016 1431 Full Code [id number]  [name], PA-C ED   01/09/2012 1522 01/11/2012 1451 Full Code [id number]  [name], PA-C ED         IV Access:   Peripheral IV   Procedures and diagnostic studies:   CARDIAC CATHETERIZATION  Result Date: 12/22/2020   Ost RCA to Prox RCA lesion is 80% stenosed.   Prox RCA lesion is 70% stenosed.   Mid RCA lesion  is 40% stenosed.   A drug-eluting stent was successfully placed using a STENT ONYX FRONTIER 3.0X38.   Post intervention, there is a 0% residual stenosis.   Post intervention, there is a 0% residual stenosis.   Post intervention, there is a 0% residual stenosis. Severe stenosis proximal to mid RCA Successful PTCA/DES x 1 proximal to mid RCA Continue DAPT with ASA and Brilinta for 12 months. Continue statin and beta blocker.   CARDIAC CATHETERIZATION  Result Date: 12/21/2020   1st Mrg  lesion is 40% stenosed.   2nd Mrg lesion is 60% stenosed.   Mid Cx to Dist Cx lesion is 30% stenosed.   Ramus-1 lesion is 50% stenosed.   Ramus-2 lesion is 80% stenosed.   2nd Diag-1 lesion is 50% stenosed.   2nd Diag-2 lesion is 70% stenosed.   Mid LAD lesion is 60% stenosed.   Prox LAD to Mid LAD lesion is 99% stenosed.   Ost RCA to Prox RCA lesion is 80% stenosed.   Prox RCA lesion is 70% stenosed.   Mid RCA lesion is 40% stenosed.   A stent was successfully placed.   Post intervention, there is a 0% residual stenosis. 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 LVEDP 16 mmHg. At the start of the intervention prior to left main engagement, the patient began to experience severe chest pain with ST segment elevation of approximately 2 to 3 mm.  Following successful intervention, the subtotal LAD occlusion was reduced to 0% with ultimate insertion of a 2.75 x 15 mm Medtronic Onyx frontiers stent postdilated to 3.96 mm with resolution of chest pain and ECG changes and with restoration of brisk TIMI-3 flow. RECOMMENDATION: DAPT for minimum of 1 year if possible.  With his culprit lesion intervention today, plan stabilization and staged PCI to his RCA either tomorrow or the next day.  Medical therapy for concomitant CAD.  Initiate statin therapy if approved by hematology.  MR LIVER W WO CONTRAST  Result Date: 12/21/2020 CLINICAL DATA:  Characterize liver mass EXAM: MRI ABDOMEN WITHOUT AND WITH CONTRAST TECHNIQUE: Multiplanar multisequence MR  imaging of the abdomen was performed both before and after the administration of intravenous contrast. CONTRAST:  88m GADAVIST GADOBUTROL 1 MMOL/ML IV SOLN COMPARISON:  CT abdomen pelvis, 12/10/2020 FINDINGS: Lower chest: No acute findings. Hepatobiliary: There is a large, contrast hypoenhancing mass of the inferior right lobe of the liver, hepatic segment VI, measuring 9.4 x 8.7 cm (series 21, image 50). This appears to occlude or at least compress the adjacent branch right portal veins, without definite evidence of tumor thrombus. Numerous tiny gallstones in the dependent gallbladder. No gallbladder wall thickening. No biliary ductal dilatation. Pancreas: No mass, inflammatory changes, or other parenchymal abnormality identified. No pancreatic ductal dilatation. Spleen:  Within normal limits in size and appearance. Adrenals/Urinary Tract: No masses identified. No evidence of hydronephrosis. Stomach/Bowel: Visualized portions within the abdomen are unremarkable. Vascular/Lymphatic: No pathologically enlarged lymph nodes identified. No abdominal aortic aneurysm demonstrated. Other:  None. Musculoskeletal: Diffusely heterogeneous, contrast enhancing vertebral  bone marrow. IMPRESSION: 1. There is a large, contrast hypoenhancing mass of the inferior right lobe of the liver, hepatic segment VI, measuring 9.4 x 8.7 cm, which appears to occlude or at least compress the adjacent branch right portal veins, without definite evidence of tumor thrombus. Appearance and contrast enhancement characteristics favor intrahepatic cholangiocarcinoma, although hepatocellular carcinoma is a differential consideration, particularly in the high risk setting of chronic hepatitis C. Prostate metastatic disease is not favored. LI-RADS category M. 2. 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 3. No lymphadenopathy or evidence of organ metastatic disease in  the abdomen. 4.  Cholelithiasis without evidence of acute cholecystitis. Electronically Signed   By: Eddie Candle M.D.   On: 12/21/2020 07:49     Medical Consultants:   None.   Subjective:    [name] chest pain-free.  Objective:    Vitals:   12/21/20 2118 12/21/20 2359 12/22/20 0529 12/22/20 0923  BP: (!) 159/89 138/74 (!) 153/74   Pulse: 66  (!) 53   Resp: 16 18    Temp: 98.5 F (36.9 C) 98.3 F (36.8 C) 98.3 F (36.8 C)   TempSrc: Oral Oral Oral   SpO2:  100% 100% 100%  Weight:   74.2 kg   Height:       SpO2: 100 % O2 Flow Rate (L/min): 2 L/min   Intake/Output Summary (Last 24 hours) at 12/22/2020 1104 Last data filed at 12/22/2020 0520 Gross per 24 hour  Intake 618.22 ml  Output 1350 ml  Net -731.78 ml    Filed Weights   12/19/20 1910 12/21/20 0651 12/22/20 0529  Weight: 74.8 kg 75.6 kg 74.2 kg    Exam: General exam: In no acute distress. Respiratory system: Good air movement and clear to auscultation. Cardiovascular system: S1 & S2 heard, RRR. No JVD. Gastrointestinal system: Abdomen is nondistended, soft and nontender.  Extremities: No pedal edema. Skin: No rashes, lesions or ulcers Psychiatry: Judgement and insight appear normal. Mood & affect appropriate.   Data Reviewed:    Labs: Basic Metabolic Panel: Recent Labs  Lab 12/19/20 1158 12/19/20 1648 12/21/20 0102 12/22/20 0210  NA 138  --  136 138  K 3.8  --  4.1 4.1  CL 111  --  106 108  CO2 20*  --  21* 22  GLUCOSE 106*  --  110* 109*  BUN 14  --  26* 20  CREATININE 0.71  --  0.91 0.84  CALCIUM 9.2  --  9.3 9.3  MG  --  2.0  --   --     GFR Estimated Creatinine Clearance: 87.7 mL/min (by C-G formula based on SCr of 0.84 mg/dL). Liver Function Tests: Recent Labs  Lab 12/19/20 1158 12/21/20 0102  AST 34 34  ALT 38 35  ALKPHOS 62 43  BILITOT 0.4 0.5  PROT 6.2* 5.9*  ALBUMIN 3.3* 3.3*    No results for input(s): LIPASE, AMYLASE in the last 168 hours. No results for  input(s): AMMONIA in the last 168 hours. Coagulation profile No results for input(s): INR, PROTIME in the last 168 hours.  COVID-19 Labs  No results for input(s): DDIMER, FERRITIN, LDH, CRP in the last 72 hours.  Lab Results  Component Value Date   SARSCOV2NAA NEGATIVE 12/19/2020   [place] NEGATIVE 03/03/2020    CBC: Recent Labs  Lab 12/19/20 1158 12/21/20 0102 12/22/20 0210  WBC 6.9 7.1 6.9  HGB 12.6* 11.8* 11.7*  HCT  38.2* 33.9* 34.2*  MCV 95.5 92.1 92.7  PLT 175 169 167    Cardiac Enzymes: No results for input(s): CKTOTAL, CKMB, CKMBINDEX, TROPONINI in the last 168 hours. BNP (last 3 results) No results for input(s): PROBNP in the last 8760 hours. CBG: Recent Labs  Lab 12/21/20 2121  GLUCAP 137*   D-Dimer: No results for input(s): DDIMER in the last 72 hours. Hgb A1c: Recent Labs    12/19/20 1648  HGBA1C 5.1    Lipid Profile: Recent Labs    12/19/20 1648  CHOL 201*  HDL 36*  LDLCALC 115*  TRIG 248*  CHOLHDL 5.6    Thyroid function studies: No results for input(s): TSH, T4TOTAL, T3FREE, THYROIDAB in the last 72 hours.  Invalid input(s): FREET3 Anemia work up: No results for input(s): VITAMINB12, FOLATE, FERRITIN, TIBC, IRON, RETICCTPCT in the last 72 hours. Sepsis Labs: Recent Labs  Lab 12/19/20 1158 12/21/20 0102 12/22/20 0210  WBC 6.9 7.1 6.9    Microbiology Recent Results (from the past 240 hour(s))  SARS CORONAVIRUS 2 (TAT 6-24 HRS) Nasopharyngeal Nasopharyngeal Swab     Status: None   Collection Time: 12/19/20  6:37 PM   Specimen: Nasopharyngeal Swab  Result Value Ref Range Status   SARS Coronavirus 2 NEGATIVE NEGATIVE Final    Comment: (NOTE) SARS-CoV-2 target nucleic acids are NOT DETECTED.  The SARS-CoV-2 RNA is generally detectable in upper and lower respiratory specimens during the acute phase of infection. Negative results do not preclude SARS-CoV-2 infection, do not rule out co-infections with other pathogens, and  should not be used as the sole basis for treatment or other patient management decisions. Negative results must be combined with clinical observations, patient history, and epidemiological information. The expected result is Negative.  Fact Sheet for Patients: SugarRoll.be  Fact Sheet for Healthcare Providers: https://www.woods-mathews.com/  This test is not yet approved or cleared by the [place] FDA and  has been authorized for detection and/or diagnosis of SARS-CoV-2 by FDA under an Emergency Use Authorization (EUA). This EUA will remain  in effect (meaning this test can be used) for the duration of the COVID-19 declaration under Se ction 564(b)(1) of the Act, 21 U.S.C. section 360bbb-3(b)(1), unless the authorization is terminated or revoked sooner.  Performed at [facility], [place] [address] [place], [place] [address]      Medications:    abiraterone acetate  1,000 mg Oral Daily   aspirin  81 mg Oral Daily   isosorbide mononitrate  60 mg Oral Daily   predniSONE  5 mg Oral Q breakfast   sodium chloride flush  3 mL Intravenous Q12H   sodium chloride flush  3 mL Intravenous Q12H   tamsulosin  0.4 mg Oral Daily   ticagrelor  90 mg Oral BID   Continuous Infusions:  sodium chloride 75 mL/hr at 12/22/20 0538   sodium chloride 75 mL/hr at 12/22/20 1057   sodium chloride        LOS: 2 days   Charlynne Cousins  [facility]  12/22/2020, 11:04 AM

## 2020-12-22 NOTE — Progress Notes (Signed)
Nutrition Brief Note  Patient identified on the Malnutrition Screening Tool (MST) Report. Spoke with patient at bedside. He reports that he has been eating well and weight has been stable. Nutrition focused physical exam completed.  No muscle or subcutaneous fat depletion noticed.   Wt Readings from Last 15 Encounters:  12/22/20 74.2 kg  12/14/20 72.8 kg  10/03/20 74.8 kg  06/28/20 77.8 kg  05/31/20 73.5 kg  04/28/20 72.2 kg  04/01/20 68.3 kg  03/14/20 68.4 kg  03/07/20 68.1 kg  02/27/18 83.6 kg  01/16/18 83.7 kg  12/19/17 80 kg  11/12/17 72.6 kg  10/25/17 65.8 kg  01/31/16 63.5 kg    Body mass index is 29.9 kg/m. Patient meets criteria for overweight based on current BMI.   Current diet order is heart healthy, patient is consuming approximately 100% of meals at this time. Labs and medications reviewed.   No nutrition interventions warranted at this time. If nutrition issues arise, please consult RD.   Lucas Mallow, RD, LDN, CNSC Please refer to [facility] for contact information.

## 2020-12-22 NOTE — Progress Notes (Signed)
ANTICOAGULATION CONSULT NOTE  Pharmacy Consult for Heparin Indication: chest pain/ACS  No Known Allergies  Patient Measurements: Height: '5\' 2"'$  (157.5 cm) Weight: 75.6 kg (166 lb 10.7 oz) IBW/kg (Calculated) : 54.6  Heparin Dosing Weight: 70.2 kg  Vital Signs: Temp: 98.3 F (36.8 C) (09/07 2359) Temp Source: Oral (09/07 2359) BP: 138/74 (09/07 2359) Pulse Rate: 66 (09/07 2118)  Labs: Recent Labs    12/19/20 1158 12/19/20 1428 12/20/20 2040 12/21/20 0102 12/21/20 0853 12/21/20 1215 12/21/20 1355 12/22/20 0210  HGB 12.6*  --   --  11.8*  --   --   --  11.7*  HCT 38.2*  --   --  33.9*  --   --   --  34.2*  PLT 175  --   --  169  --   --   --  167  HEPARINUNFRC  --   --    < > <0.10* 0.12*  --   --  0.16*  CREATININE 0.71  --   --  0.91  --   --   --  0.84  TROPONINIHS 46* 97*  --   --   --  28* 48*  --    < > = values in this interval not displayed.     Estimated Creatinine Clearance: 88.5 mL/min (by C-G formula based on SCr of 0.84 mg/dL).   Assessment: 55 y.o. male presented on 9/05 for ongoing chest pain. Initially, patient was not started on heparin due to previous imaging with concerns of epidural space mass. Per Cardiology recommendations, OK to initiate IV heparin for ACS.  He is now s/p cath with stent to LAD and plans for staged PCI to RCA. Heparin to restart 8 hours post sheath removal (removed ~ 11:30am)  AM heparin level below goal on 1550 units/hr. Will cbc stable, no bleeding issues noted.    Goal of Therapy:  Heparin level 0.3-0.7 units/ml Monitor platelets by anticoagulation protocol: Yes   Plan:  - Increase heparin to 1750 unit/hr  -Heparin level in 6 hours  Erin Hearing PharmD., BCPS Clinical Pharmacist 12/22/2020 3:25 AM

## 2020-12-22 NOTE — Progress Notes (Signed)
HEMATOLOGY-ONCOLOGY PROGRESS NOTE  SUBJECTIVE: Edgar Mooney reports no further chest pain.  He continues to have pain in the right abdomen.  No new complaint. PHYSICAL EXAMINATION:  Vitals:   12/22/20 1104 12/22/20 1259  BP: (!) 184/81 (!) 148/76  Pulse: (!) 57 61  Resp: 17 20  Temp:    SpO2: 99% 97%   Filed Weights   12/19/20 1910 12/21/20 0651 12/22/20 0529  Weight: 164 lb 14.4 oz (74.8 kg) 166 lb 10.7 oz (75.6 kg) 163 lb 8 oz (74.2 kg)    Intake/Output from previous day: 09/07 0701 - 09/08 0700 In: 618.2 [I.V.:618.2] Out: 1350 [Urine:1350] ABDOMEN:abdomen soft, no hepatomegaly, no mass, mild tenderness in the upper lateral right abdomen NEURO: alert & oriented  LABORATORY DATA:  I have reviewed the data as listed CMP Latest Ref Rng & Units 12/22/2020 12/21/2020 12/19/2020  Glucose 70 - 99 mg/dL 109(H) 110(H) 106(H)  BUN 6 - 20 mg/dL 20 26(H) 14  Creatinine 0.61 - 1.24 mg/dL 0.84 0.91 0.71  Sodium 135 - 145 mmol/L 138 136 138  Potassium 3.5 - 5.1 mmol/L 4.1 4.1 3.8  Chloride 98 - 111 mmol/L 108 106 111  CO2 22 - 32 mmol/L 22 21(L) 20(L)  Calcium 8.9 - 10.3 mg/dL 9.3 9.3 9.2  Total Protein 6.5 - 8.1 g/dL - 5.9(L) 6.2(L)  Total Bilirubin 0.3 - 1.2 mg/dL - 0.5 0.4  Alkaline Phos 38 - 126 U/L - 43 62  AST 15 - 41 U/L - 34 34  ALT 0 - 44 U/L - 35 38    Lab Results  Component Value Date   WBC 6.9 12/22/2020   HGB 11.7 (L) 12/22/2020   HCT 34.2 (L) 12/22/2020   MCV 92.7 12/22/2020   PLT 167 12/22/2020   NEUTROABS 4.0 10/03/2020    DG Chest 2 View  Result Date: 12/19/2020 CLINICAL DATA:  Chest pain, diaphoresis EXAM: CHEST - 2 VIEW COMPARISON:  03/03/2020 FINDINGS: Lower lung volumes with similar diffuse bilateral interstitial opacities may represent chronic lung disease. Minor basilar atelectasis. Normal heart size and vascularity. No definite focal pneumonia, collapse or consolidation. Negative for effusion or pneumothorax. Trachea midline. Degenerative changes of the  spine. IMPRESSION: Similar chronic interstitial lung changes with lower lung volumes. No definite superimposed acute process. Electronically Signed   By: [name].  Shick M.D.   On: 12/19/2020 12:45   MR Cervical Spine W or Wo Contrast  Result Date: 12/20/2020 CLINICAL DATA:  Initial evaluation for intermittent left-sided chest pain, history of metastatic prostate cancer. EXAM: MRI CERVICAL, THORACIC AND LUMBAR SPINE WITHOUT AND WITH CONTRAST TECHNIQUE: Multiplanar and multiecho pulse sequences of the cervical spine, to include the craniocervical junction and cervicothoracic junction, and thoracic and lumbar spine, were obtained without and with intravenous contrast. CONTRAST:  37m GADAVIST GADOBUTROL 1 MMOL/ML IV SOLN COMPARISON:  Prior MRIs from 03/04/2020. FINDINGS: MRI CERVICAL SPINE FINDINGS Alignment: Mild straightening of the normal cervical lordosis. No listhesis. Vertebrae: Diffuse infiltrative low marrow signal intensity again seen throughout the visualized skull base and spine. Previously seen superimposed enhancing lesions are markedly improved as compared to previous exam, compatible with interval response to therapy. Appearance is most evident at the posterior arch of C2 where only a small focus of enhancement is now seen on today's exam, previously fairly prominent on prior MRI. No definite new or progressive lesions. Vertebral body height maintained without pathologic or interval fracture. Previously question extra osseous tumor on the right at C5-6 is not convincingly seen on today's exam.  No other new extra osseous extension. No epidural or intracanalicular involvement identified. Cord: Normal signal and morphology. No abnormal enhancement or intracanalicular involvement of tumor. Posterior Fossa, vertebral arteries, paraspinal tissues: Empty sella partially visualized. Visualized portions of the brain and posterior fossa otherwise unremarkable. Paraspinous and prevertebral soft tissues within normal  limits. Normal flow voids seen within the vertebral arteries bilaterally. Disc levels: C2-C3: Negative interspace. Mild left-sided facet hypertrophy. No stenosis. C3-C4: Broad posterior disc osteophyte flattens the ventral thecal sac with resultant mild-to-moderate spinal stenosis. Superimposed uncovertebral and facet hypertrophy with resultant moderate left with mild right C4 foraminal stenosis. C4-C5: Mild disc bulge with uncovertebral spurring. Bilateral facet hypertrophy. No significant spinal stenosis. Moderate right with mild left C5 foraminal narrowing. C5-C6: Degenerative intervertebral disc space narrowing with diffuse disc osteophyte complex. Flattening and partial effacement of the ventral thecal sac, eccentric to the right. Mild spinal stenosis. Severe right worse than left C6 foraminal narrowing. C6-C7: Disc bulge with uncovertebral spurring. Superimposed left paracentral disc protrusion indents the left ventral thecal sac. Mild spinal stenosis. Mild to moderate left with mild right C7 foraminal narrowing. C7-T1:  Normal interspace.  No canal or foraminal stenosis. MRI THORACIC SPINE FINDINGS Alignment: Physiologic with preservation of the normal thoracic kyphosis. No listhesis. Vertebrae: Diffusely abnormal appearance of the visualized osseous structures, consistent with widespread osseous metastatic disease. Overall appearance is markedly improved as compared to previous exam, with decreased heterogeneous enhancement and STIR signal abnormality seen throughout the thoracic spine. Previously seen trace epidural tumor posterior to the T12 vertebral body is no longer visualized. No new extra osseous or epidural tumor identified. Vertebral body height maintained without pathologic or interval fracture. Cord: Normal signal and morphology. No epidural or intracanalicular tumor. No abnormal enhancement. Paraspinal and other soft tissues: Paraspinous soft tissues within normal limits. Splenomegaly partially  visualized. Disc levels: No new significant disc pathology. No significant stenosis or neural impingement. MRI LUMBAR SPINE FINDINGS Segmentation: Standard. Lowest well-formed disc space labeled the L5-S1 level. Alignment: Physiologic with preservation of the normal lumbar lordosis. No listhesis. Vertebrae: Widespread osseous metastatic disease again seen throughout the visualized lumbar spine and pelvis. Overall, appearance is markedly improved with decreased heterogeneous enhancement seen throughout the affected structures as compared to prior. Vertebral body height maintained without interval or pathologic fracture. Previously seen epidural tumor posterior to the S1 vertebral body has largely resolved, and is no longer seen. Similarly, previously seen extraosseous extension from the bilateral ilium is also no longer clearly visualized. No significant extraosseous extension now seen. No significant epidural or intracanalicular involvement. Conus medullaris and cauda equina: Conus extends to the T12 level. Conus and cauda equina appear normal. Paraspinal and other soft tissues: Interval resolution in previously seen bulky retroperitoneal adenopathy. Disc levels: L1-2:  Unremarkable. L2-3:  Negative interspace.  Mild facet hypertrophy.  No stenosis. L3-4: Disc bulge with mild facet hypertrophy. No spinal stenosis. Mild left L3 foraminal narrowing. L4-5: Degenerative intervertebral disc space narrowing with diffuse disc bulge and reactive endplate spurring. Mild to moderate facet hypertrophy. Resultant mild canal with moderate bilateral lateral recess stenosis. Moderate right with mild left L4 foraminal narrowing. L5-S1: Degenerative intervertebral disc space narrowing with diffuse disc bulge and reactive endplate spurring. Superimposed left subarticular disc protrusion contacts and mildly displaces the descending left S1 nerve root. Mild-to-moderate facet hypertrophy. Resultant moderate left with mild right lateral  recess stenosis. Mild bilateral L5 foraminal narrowing. IMPRESSION: MRI CERVICAL SPINE IMPRESSION: 1. Interval improvement in appearance of widespread osseous metastatic disease, consistent with interval  response to therapy. No pathologic fracture. Previously seen extraosseous/epidural tumor at C5-6 is no longer clearly visualized, with no extraosseous or epidural tumor now seen. 2. Underlying multilevel cervical spondylosis with resultant mild-to-moderate spinal stenosis at C3-4 through C6-7 as above. Moderate left C4 and right C5 foraminal stenosis, with severe bilateral C6 foraminal narrowing. MRI THORACIC SPINE IMPRESSION: Marked interval improvement in appearance of widespread osseous metastatic disease, consistent with interval response to therapy. No pathologic fracture. No extra osseous or epidural tumor identified. MRI LUMBAR SPINE IMPRESSION: 1. Interval improvement in widespread osseous metastatic disease involving the lumbar spine and visualized pelvis. No pathologic fracture. 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 No significant epidural or extraosseous disease now seen. 2. Interval improvement/resolution in previously seen retroperitoneal adenopathy. 3. Left subarticular disc protrusion at L5-S1, potentially affecting the left S1 nerve root. Additional more mild degenerative spondylosis elsewhere within the lumbar spine as above. Electronically Signed   By: Jeannine Boga M.D.   On: 12/20/2020 05:37   MR THORACIC SPINE W WO CONTRAST  Result Date: 12/20/2020 CLINICAL DATA:  Initial evaluation for intermittent left-sided chest pain, history of metastatic prostate cancer. EXAM: MRI CERVICAL, THORACIC AND LUMBAR SPINE WITHOUT AND WITH CONTRAST TECHNIQUE: Multiplanar and multiecho pulse sequences of the cervical spine, to include the craniocervical junction and cervicothoracic junction, and  thoracic and lumbar spine, were obtained without and with intravenous contrast. CONTRAST:  32m GADAVIST GADOBUTROL 1 MMOL/ML IV SOLN COMPARISON:  Prior MRIs from 03/04/2020. FINDINGS: MRI CERVICAL SPINE FINDINGS Alignment: Mild straightening of the normal cervical lordosis. No listhesis. Vertebrae: Diffuse infiltrative low marrow signal intensity again seen throughout the visualized skull base and spine. Previously seen superimposed enhancing lesions are markedly improved as compared to previous exam, compatible with interval response to therapy. Appearance is most evident at the posterior arch of C2 where only a small focus of enhancement is now seen on today's exam, previously fairly prominent on prior MRI. No definite new or progressive lesions. Vertebral body height maintained without pathologic or interval fracture. Previously question extra osseous tumor on the right at C5-6 is not convincingly seen on today's exam. No other new extra osseous extension. No epidural or intracanalicular involvement identified. Cord: Normal signal and morphology. No abnormal enhancement or intracanalicular involvement of tumor. Posterior Fossa, vertebral arteries, paraspinal tissues: Empty sella partially visualized. Visualized portions of the brain and posterior fossa otherwise unremarkable. Paraspinous and prevertebral soft tissues within normal limits. Normal flow voids seen within the vertebral arteries bilaterally. Disc levels: C2-C3: Negative interspace. Mild left-sided facet hypertrophy. No stenosis. C3-C4: Broad posterior disc osteophyte flattens the ventral thecal sac with resultant mild-to-moderate spinal stenosis. Superimposed uncovertebral and facet hypertrophy with resultant moderate left with mild right C4 foraminal stenosis. C4-C5: Mild disc bulge with uncovertebral spurring. Bilateral facet hypertrophy. No significant spinal stenosis. Moderate right with mild left C5 foraminal narrowing. C5-C6: Degenerative  intervertebral disc space narrowing with diffuse disc osteophyte complex. Flattening and partial effacement of the ventral thecal sac, eccentric to the right. Mild spinal stenosis. Severe right worse than left C6 foraminal narrowing. C6-C7: Disc bulge with uncovertebral spurring. Superimposed left paracentral disc protrusion indents the left ventral thecal sac. Mild spinal stenosis. Mild to moderate left with mild right C7 foraminal narrowing. C7-T1:  Normal interspace.  No canal or foraminal stenosis. MRI THORACIC SPINE FINDINGS Alignment: Physiologic with preservation of the normal thoracic kyphosis. No listhesis. Vertebrae: Diffusely abnormal  appearance of the visualized osseous structures, consistent with widespread osseous metastatic disease. Overall appearance is markedly improved as compared to previous exam, with decreased heterogeneous enhancement and STIR signal abnormality seen throughout the thoracic spine. Previously seen trace epidural tumor posterior to the T12 vertebral body is no longer visualized. No new extra osseous or epidural tumor identified. Vertebral body height maintained without pathologic or interval fracture. Cord: Normal signal and morphology. No epidural or intracanalicular tumor. No abnormal enhancement. Paraspinal and other soft tissues: Paraspinous soft tissues within normal limits. Splenomegaly partially visualized. Disc levels: No new significant disc pathology. No significant stenosis or neural impingement. MRI LUMBAR SPINE FINDINGS Segmentation: Standard. Lowest well-formed disc space labeled the L5-S1 level. Alignment: Physiologic with preservation of the normal lumbar lordosis. No listhesis. Vertebrae: Widespread osseous metastatic disease again seen throughout the visualized lumbar spine and pelvis. Overall, appearance is markedly improved with decreased heterogeneous enhancement seen throughout the affected structures as compared to prior. Vertebral body height maintained  without interval or pathologic fracture. Previously seen epidural tumor posterior to the S1 vertebral body has largely resolved, and is no longer seen. Similarly, previously seen extraosseous extension from the bilateral ilium is also no longer clearly visualized. No significant extraosseous extension now seen. No significant epidural or intracanalicular involvement. Conus medullaris and cauda equina: Conus extends to the T12 level. Conus and cauda equina appear normal. Paraspinal and other soft tissues: Interval resolution in previously seen bulky retroperitoneal adenopathy. Disc levels: L1-2:  Unremarkable. L2-3:  Negative interspace.  Mild facet hypertrophy.  No stenosis. L3-4: Disc bulge with mild facet hypertrophy. No spinal stenosis. Mild left L3 foraminal narrowing. L4-5: Degenerative intervertebral disc space narrowing with diffuse disc bulge and reactive endplate spurring. Mild to moderate facet hypertrophy. Resultant mild canal with moderate bilateral lateral recess stenosis. Moderate right with mild left L4 foraminal narrowing. L5-S1: Degenerative intervertebral disc space narrowing with diffuse disc bulge and reactive endplate spurring. Superimposed left subarticular disc protrusion contacts and mildly displaces the descending left S1 nerve root. Mild-to-moderate facet hypertrophy. Resultant moderate left with mild right lateral recess stenosis. Mild bilateral L5 foraminal narrowing. IMPRESSION: MRI CERVICAL SPINE IMPRESSION: 1. Interval improvement in appearance of widespread osseous metastatic disease, consistent with interval response to therapy. No pathologic fracture. Previously seen extraosseous/epidural tumor at C5-6 is no longer clearly visualized, with no extraosseous or epidural tumor now seen. 2. Underlying multilevel cervical spondylosis with resultant mild-to-moderate spinal stenosis at C3-4 through C6-7 as above. Moderate left C4 and right C5 foraminal stenosis, with severe bilateral C6  foraminal narrowing. MRI THORACIC SPINE IMPRESSION: Marked interval improvement in appearance of widespread osseous metastatic disease, consistent with interval response to therapy. No pathologic fracture. No extra osseous or epidural tumor identified. MRI LUMBAR SPINE IMPRESSION: 1. Interval improvement in widespread osseous metastatic disease involving the lumbar spine and visualized pelvis. No pathologic fracture. 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 No significant epidural or extraosseous disease now seen. 2. Interval improvement/resolution in previously seen retroperitoneal adenopathy. 3. Left subarticular disc protrusion at L5-S1, potentially affecting the left S1 nerve root. Additional more mild degenerative spondylosis elsewhere within the lumbar spine as above. Electronically Signed   By: Jeannine Boga M.D.   On: 12/20/2020 05:37   MR Lumbar Spine W Wo Contrast  Result Date: 12/20/2020 CLINICAL DATA:  Initial evaluation for intermittent left-sided chest pain, history of metastatic prostate cancer. EXAM: MRI CERVICAL, THORACIC AND LUMBAR  SPINE WITHOUT AND WITH CONTRAST TECHNIQUE: Multiplanar and multiecho pulse sequences of the cervical spine, to include the craniocervical junction and cervicothoracic junction, and thoracic and lumbar spine, were obtained without and with intravenous contrast. CONTRAST:  57m GADAVIST GADOBUTROL 1 MMOL/ML IV SOLN COMPARISON:  Prior MRIs from 03/04/2020. FINDINGS: MRI CERVICAL SPINE FINDINGS Alignment: Mild straightening of the normal cervical lordosis. No listhesis. Vertebrae: Diffuse infiltrative low marrow signal intensity again seen throughout the visualized skull base and spine. Previously seen superimposed enhancing lesions are markedly improved as compared to previous exam, compatible with interval response to therapy. Appearance is most evident at the  posterior arch of C2 where only a small focus of enhancement is now seen on today's exam, previously fairly prominent on prior MRI. No definite new or progressive lesions. Vertebral body height maintained without pathologic or interval fracture. Previously question extra osseous tumor on the right at C5-6 is not convincingly seen on today's exam. No other new extra osseous extension. No epidural or intracanalicular involvement identified. Cord: Normal signal and morphology. No abnormal enhancement or intracanalicular involvement of tumor. Posterior Fossa, vertebral arteries, paraspinal tissues: Empty sella partially visualized. Visualized portions of the brain and posterior fossa otherwise unremarkable. Paraspinous and prevertebral soft tissues within normal limits. Normal flow voids seen within the vertebral arteries bilaterally. Disc levels: C2-C3: Negative interspace. Mild left-sided facet hypertrophy. No stenosis. C3-C4: Broad posterior disc osteophyte flattens the ventral thecal sac with resultant mild-to-moderate spinal stenosis. Superimposed uncovertebral and facet hypertrophy with resultant moderate left with mild right C4 foraminal stenosis. C4-C5: Mild disc bulge with uncovertebral spurring. Bilateral facet hypertrophy. No significant spinal stenosis. Moderate right with mild left C5 foraminal narrowing. C5-C6: Degenerative intervertebral disc space narrowing with diffuse disc osteophyte complex. Flattening and partial effacement of the ventral thecal sac, eccentric to the right. Mild spinal stenosis. Severe right worse than left C6 foraminal narrowing. C6-C7: Disc bulge with uncovertebral spurring. Superimposed left paracentral disc protrusion indents the left ventral thecal sac. Mild spinal stenosis. Mild to moderate left with mild right C7 foraminal narrowing. C7-T1:  Normal interspace.  No canal or foraminal stenosis. MRI THORACIC SPINE FINDINGS Alignment: Physiologic with preservation of the normal  thoracic kyphosis. No listhesis. Vertebrae: Diffusely abnormal appearance of the visualized osseous structures, consistent with widespread osseous metastatic disease. Overall appearance is markedly improved as compared to previous exam, with decreased heterogeneous enhancement and STIR signal abnormality seen throughout the thoracic spine. Previously seen trace epidural tumor posterior to the T12 vertebral body is no longer visualized. No new extra osseous or epidural tumor identified. Vertebral body height maintained without pathologic or interval fracture. Cord: Normal signal and morphology. No epidural or intracanalicular tumor. No abnormal enhancement. Paraspinal and other soft tissues: Paraspinous soft tissues within normal limits. Splenomegaly partially visualized. Disc levels: No new significant disc pathology. No significant stenosis or neural impingement. MRI LUMBAR SPINE FINDINGS Segmentation: Standard. Lowest well-formed disc space labeled the L5-S1 level. Alignment: Physiologic with preservation of the normal lumbar lordosis. No listhesis. Vertebrae: Widespread osseous metastatic disease again seen throughout the visualized lumbar spine and pelvis. Overall, appearance is markedly improved with decreased heterogeneous enhancement seen throughout the affected structures as compared to prior. Vertebral body height maintained without interval or pathologic fracture. Previously seen epidural tumor posterior to the S1 vertebral body has largely resolved, and is no longer seen. Similarly, previously seen extraosseous extension from the bilateral ilium is also no longer clearly visualized. No significant extraosseous extension now seen. No significant epidural or intracanalicular involvement. Conus  medullaris and cauda equina: Conus extends to the T12 level. Conus and cauda equina appear normal. Paraspinal and other soft tissues: Interval resolution in previously seen bulky retroperitoneal adenopathy. Disc  levels: L1-2:  Unremarkable. L2-3:  Negative interspace.  Mild facet hypertrophy.  No stenosis. L3-4: Disc bulge with mild facet hypertrophy. No spinal stenosis. Mild left L3 foraminal narrowing. L4-5: Degenerative intervertebral disc space narrowing with diffuse disc bulge and reactive endplate spurring. Mild to moderate facet hypertrophy. Resultant mild canal with moderate bilateral lateral recess stenosis. Moderate right with mild left L4 foraminal narrowing. L5-S1: Degenerative intervertebral disc space narrowing with diffuse disc bulge and reactive endplate spurring. Superimposed left subarticular disc protrusion contacts and mildly displaces the descending left S1 nerve root. Mild-to-moderate facet hypertrophy. Resultant moderate left with mild right lateral recess stenosis. Mild bilateral L5 foraminal narrowing. IMPRESSION: MRI CERVICAL SPINE IMPRESSION: 1. Interval improvement in appearance of widespread osseous metastatic disease, consistent with interval response to therapy. No pathologic fracture. Previously seen extraosseous/epidural tumor at C5-6 is no longer clearly visualized, with no extraosseous or epidural tumor now seen. 2. Underlying multilevel cervical spondylosis with resultant mild-to-moderate spinal stenosis at C3-4 through C6-7 as above. Moderate left C4 and right C5 foraminal stenosis, with severe bilateral C6 foraminal narrowing. MRI THORACIC SPINE IMPRESSION: Marked interval improvement in appearance of widespread osseous metastatic disease, consistent with interval response to therapy. No pathologic fracture. No extra osseous or epidural tumor identified. MRI LUMBAR SPINE IMPRESSION: 1. Interval improvement in widespread osseous metastatic disease involving the lumbar spine and visualized pelvis. No pathologic fracture. 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 No significant epidural  or extraosseous disease now seen. 2. Interval improvement/resolution in previously seen retroperitoneal adenopathy. 3. Left subarticular disc protrusion at L5-S1, potentially affecting the left S1 nerve root. Additional more mild degenerative spondylosis elsewhere within the lumbar spine as above. Electronically Signed   By: Jeannine Boga M.D.   On: 12/20/2020 05:37   CT ABDOMEN PELVIS W CONTRAST  Addendum Date: 12/10/2020   ADDENDUM REPORT: 12/10/2020 14:58 ADDENDUM: Of note, the hepatic mass could be further evaluated with a multiphase MR abdomen without and with contrast. Electronically Signed   By: Zerita Boers M.D.   On: 12/10/2020 14:58   Result Date: 12/10/2020 CLINICAL DATA:  Right lower quadrant pain. The patient has a history of hepatitis C cirrhosis and prostate cancer. EXAM: CT ABDOMEN AND PELVIS WITH CONTRAST TECHNIQUE: Multidetector CT imaging of the abdomen and pelvis was performed using the standard protocol following bolus administration of intravenous contrast. CONTRAST:  67m OMNIPAQUE IOHEXOL 350 MG/ML SOLN COMPARISON:  CT abdomen pelvis dated 03/03/2020. FINDINGS: Lower chest: Mild bilateral dependent atelectasis. Hepatobiliary: An enhancing mass in the right hepatic lobe measures 9.1 x 9.2 x 9.2 cm and appears to demonstrate washout enhancement on delayed phase imaging. This was not seen on prior exam. No gallstones, gallbladder wall thickening, or biliary dilatation. Pancreas: Calcifications in the head of the pancreas are unchanged and likely reflect chronic pancreatitis. No pancreatic ductal dilatation or surrounding inflammatory changes. Spleen: Normal in size without focal abnormality. Adrenals/Urinary Tract: Adrenal glands are unremarkable. Kidneys are normal, without renal calculi, focal lesion, or hydronephrosis. The urinary bladder wall appears thickened for the degree of distension. Stomach/Bowel: Stomach is within normal limits. Appendix appears normal. No evidence of  bowel wall thickening, distention, or inflammatory changes. Vascular/Lymphatic: Aortic atherosclerosis. A lymph node to the left of  the inferior vena cava near the porta hepatis measures 1.2 cm in short axis (series 3, image 24 and may be slightly enlarged compared to 03/03/2020. No enlarged pelvic lymph nodes. Reproductive: Prostate is unremarkable. Other: There is a small amount of peritoneal fluid near the hepatic mass extending along the right pericolic gutter. No abdominal wall hernia or abnormality. Musculoskeletal: Numerous sclerotic lesions throughout the spine and pelvis appear progressed since prior exam. Degenerative changes are seen in the spine. Previously seen metastatic disease involving the ventral epidural space at the level of S1 is not appreciated on today's exam. IMPRESSION: 1. Enhancing mass in the right hepatic lobe is concerning for hepatocellular carcinoma given the apparent washout on delayed phase and reported history of hepatitis C cirrhosis. Metastatic disease from the patient's reported prostate cancer is considered less likely. 2. Urinary bladder wall thickening for the degree of distension may reflect cystitis or chronic bladder outlet obstruction. 3. Numerous sclerotic lesions throughout the spine and pelvis appear progressed since 03/03/2020, likely representing metastatic disease. Electronically Signed: By: Zerita Boers M.D. On: 12/10/2020 14:53   CARDIAC CATHETERIZATION  Result Date: 12/22/2020   Ost RCA to Prox RCA lesion is 80% stenosed.   Prox RCA lesion is 70% stenosed.   Mid RCA lesion is 40% stenosed.   A drug-eluting stent was successfully placed using a STENT ONYX FRONTIER 3.0X38.   Post intervention, there is a 0% residual stenosis.   Post intervention, there is a 0% residual stenosis.   Post intervention, there is a 0% residual stenosis. Severe stenosis proximal to mid RCA Successful PTCA/DES x 1 proximal to mid RCA Continue DAPT with ASA and Brilinta for 12 months.  Continue statin and beta blocker.   CARDIAC CATHETERIZATION  Result Date: 12/21/2020   1st Mrg lesion is 40% stenosed.   2nd Mrg lesion is 60% stenosed.   Mid Cx to Dist Cx lesion is 30% stenosed.   Ramus-1 lesion is 50% stenosed.   Ramus-2 lesion is 80% stenosed.   2nd Diag-1 lesion is 50% stenosed.   2nd Diag-2 lesion is 70% stenosed.   Mid LAD lesion is 60% stenosed.   Prox LAD to Mid LAD lesion is 99% stenosed.   Ost RCA to Prox RCA lesion is 80% stenosed.   Prox RCA lesion is 70% stenosed.   Mid RCA lesion is 40% stenosed.   A stent was successfully placed.   Post intervention, there is a 0% residual stenosis. 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 LVEDP 16 mmHg. At the start of the intervention prior to left main engagement, the patient began to experience severe chest pain with ST segment elevation of approximately 2 to 3 mm.  Following successful intervention, the subtotal LAD occlusion was reduced to 0% with ultimate insertion of a 2.75 x 15 mm Medtronic Onyx frontiers stent postdilated to 3.96 mm with resolution of chest pain and ECG changes and with restoration of brisk TIMI-3 flow. RECOMMENDATION: DAPT for minimum of 1 year if possible.  With his culprit lesion  intervention today, plan stabilization and staged PCI to his RCA either tomorrow or the next day.  Medical therapy for concomitant CAD.  Initiate statin therapy if approved by hematology.  MR LIVER W WO CONTRAST  Result Date: 12/21/2020 CLINICAL DATA:   Characterize liver mass EXAM: MRI ABDOMEN WITHOUT AND WITH CONTRAST TECHNIQUE: Multiplanar multisequence MR imaging of the abdomen was performed both before and after the administration of intravenous contrast. CONTRAST:  54m GADAVIST GADOBUTROL 1 MMOL/ML IV SOLN COMPARISON:  CT abdomen pelvis, 12/10/2020 FINDINGS: Lower chest: No acute findings. Hepatobiliary: There is a large, contrast hypoenhancing mass of the inferior right lobe of the liver, hepatic segment VI, measuring 9.4 x 8.7 cm (series 21, image 50). This appears to occlude or at least compress the adjacent branch right portal veins, without definite evidence of tumor thrombus. Numerous tiny gallstones in the dependent gallbladder. No gallbladder wall thickening. No biliary ductal dilatation. Pancreas: No mass, inflammatory changes, or other parenchymal abnormality identified. No pancreatic ductal dilatation. Spleen:  Within normal limits in size and appearance. Adrenals/Urinary Tract: No masses identified. No evidence of hydronephrosis. Stomach/Bowel: Visualized portions within the abdomen are unremarkable. Vascular/Lymphatic: No pathologically enlarged lymph nodes identified. No abdominal aortic aneurysm demonstrated. Other:  None. Musculoskeletal: Diffusely heterogeneous, contrast enhancing vertebral bone marrow. IMPRESSION: 1. There is a large, contrast hypoenhancing mass of the inferior right lobe of the liver, hepatic segment VI, measuring 9.4 x 8.7 cm, which appears to occlude or at least compress the adjacent branch right portal veins, without definite evidence of tumor thrombus. Appearance and contrast enhancement characteristics favor intrahepatic cholangiocarcinoma, although hepatocellular carcinoma is a differential consideration, particularly in the high risk setting of chronic hepatitis C. Prostate metastatic disease is not favored. LI-RADS category M. 2. Diffusely heterogeneous, contrast enhancing vertebral bone marrow, in keeping with  patient's known metastatic prostate malignancy. 3. No lymphadenopathy or evidence of organ metastatic disease in the abdomen. 4.  Cholelithiasis without evidence of acute cholecystitis. Electronically Signed   By: AEddie CandleM.D.   On: 12/21/2020 07:49   ECHOCARDIOGRAM COMPLETE  Result Date: 12/19/2020    ECHOCARDIOGRAM REPORT   Patient Name:   GGILLIS HOLTHUSENMANNING Date of Exam: 12/19/2020 Medical Rec #:  [id number]    Height:       62.0 in Accession #:    [id number]   Weight:       160.0 lb Date of Birth:  805-Oct-1967    BSA:          1.739 m Patient Age:    89years      BP:           160/79 mmHg Patient Gender: M             HR:           57 bpm. Exam Location:  Inpatient Procedure: 2D Echo Indications:    chest pain  History:        Patient has no prior history of Echocardiogram examinations.                 Risk Factors:Hypertension and polysubstance abuse.  Sonographer:    LJohny ChessRDCS Referring Phys: [id number] 1. Left ventricular ejection fraction, by estimation, is 50 to 55%. The left ventricle has low normal function. The left ventricle demonstrates regional wall motion abnormalities with basal to mid inferior hypokinesis. Left ventricular diastolic parameters were normal.  2. Right ventricular systolic function is normal. The right  ventricular size is normal. Tricuspid regurgitation signal is inadequate for assessing PA pressure.  3. Left atrial size was mild to moderately dilated.  4. The mitral valve is normal in structure. No evidence of mitral valve regurgitation. No evidence of mitral stenosis.  5. The aortic valve is tricuspid. Aortic valve regurgitation is not visualized. No aortic stenosis is present.  6. The inferior vena cava is normal in size with greater than 50% respiratory variability, suggesting right atrial pressure of 3 mmHg. FINDINGS  Left Ventricle: Left ventricular ejection fraction, by estimation, is 50 to 55%. The left ventricle has low normal function.  The left ventricle demonstrates regional wall motion abnormalities. The left ventricular internal cavity size was normal in size. There is no left ventricular hypertrophy. Left ventricular diastolic parameters were normal. Right Ventricle: The right ventricular size is normal. No increase in right ventricular wall thickness. Right ventricular systolic function is normal. Tricuspid regurgitation signal is inadequate for assessing PA pressure. Left Atrium: Left atrial size was mild to moderately dilated. Right Atrium: Right atrial size was normal in size. Pericardium: There is no evidence of pericardial effusion. Mitral Valve: The mitral valve is normal in structure. No evidence of mitral valve regurgitation. No evidence of mitral valve stenosis. Tricuspid Valve: The tricuspid valve is normal in structure. Tricuspid valve regurgitation is not demonstrated. Aortic Valve: The aortic valve is tricuspid. Aortic valve regurgitation is not visualized. No aortic stenosis is present. Pulmonic Valve: The pulmonic valve was normal in structure. Pulmonic valve regurgitation is not visualized. Aorta: The aortic root is normal in size and structure. Venous: The inferior vena cava is normal in size with greater than 50% respiratory variability, suggesting right atrial pressure of 3 mmHg. IAS/Shunts: No atrial level shunt detected by color flow Doppler.  LEFT VENTRICLE PLAX 2D LVIDd:         5.10 cm  Diastology LVIDs:         3.20 cm  LV e' medial:    8.92 cm/s LV PW:         0.90 cm  LV E/e' medial:  11.0 LV IVS:        0.80 cm  LV e' lateral:   11.20 cm/s LVOT diam:     2.10 cm  LV E/e' lateral: 8.8 LV SV:         68 LV SV Index:   39 LVOT Area:     3.46 cm  RIGHT VENTRICLE             IVC RV S prime:     15.40 cm/s  IVC diam: 1.90 cm TAPSE (M-mode): 2.4 cm LEFT ATRIUM             Index       RIGHT ATRIUM           Index LA diam:        3.80 cm 2.19 cm/m  RA Area:     11.90 cm LA Vol (A2C):   75.9 ml 43.65 ml/m RA Volume:    26.70 ml  15.36 ml/m LA Vol (A4C):   69.1 ml 39.74 ml/m LA Biplane Vol: 75.3 ml 43.31 ml/m  AORTIC VALVE LVOT Vmax:   101.00 cm/s LVOT Vmean:  61.800 cm/s LVOT VTI:    0.197 m  AORTA Ao Root diam: 3.00 cm Ao Asc diam:  3.30 cm MITRAL VALVE MV Area (PHT): 3.00 cm    SHUNTS MV Decel Time: 253 msec    Systemic VTI:  0.20 m  MV E velocity: 98.50 cm/s  Systemic Diam: 2.10 cm MV A velocity: 62.90 cm/s MV E/A ratio:  1.57 Dalton McleanMD Electronically signed by Franki Monte Signature Date/Time: 12/19/2020/5:32:35 PM    Final     ASSESSMENT AND PLAN: 1. Metastatic prostate cancer - Lytic bone lesions with retroperitoneal adenopathy concerning for metastatic prostate cancer -03/03/2020-CTA chest/abdomen/pelvis widespread osseous metastatic disease and lower retroperitoneal adenopathy, pathologic right anterior third rib fracture, probable spinal canal tumor at the level of S1, -MRI cervical, thoracic, and lumbar spine 03/03/2020-diffuse osseous metastatic disease, ventral epidural tumor impinging on right S1 nerve root, extraosseous tumor at the bilateral ilium, asymmetric enhancing material at the right C5-6 canal-right  -03/03/2020-PSA 763 -Degarelix 03/04/2020 -Abiraterone/prednisone 03/14/2020 -Every 69-monthLupron 04/01/2020 -04/01/2020 PSA 36.5 -05/31/2020 PSA 1.2 -06/28/2020 PSA 1.0 2.  Severe anemia-likely secondary to metastatic prostate cancer involving the bones 3.  Mild thrombocytopenia 4.  Cirrhosis with splenomegaly 5.  History of hepatitis C 6.  History of polysubstance abuse 7.  Depression 8.  Tobacco dependence 9.  Right arm weakness, right facial numbness-potentially related to nerve root compromise from metastatic bone lesions 10.  Pain secondary to #1 11.  Extensive bone metastases-every 348-monthometa starting 04/01/2020 12.  Gout-acute flare right wrist 04/01/2020 treated with indomethacin, allopurinol resumed 13.  Right hepatic lobe mass with elevated AFP concerning for  [facility] 12/21[facility]2020-hypoenhancing inferior right liver mass, segment 6, occluding or compressing the adjacent right portal vein branch, intrahepatic cholangiocarcinoma favored with differential including hepatocellular carcinoma, Li-Rads M 14.  Admission 12/19/2020 with an NSTEMI Catheterization 12/21/2020-severe multivessel CAD, 99% proximal LAD stenosis, LAD stent placed  Edgar Mooney currently admitted following an NSTEMI and is undergoing cardiac intervention procedures.  He continues treatment with abiraterone/prednisone for metastatic prostate cancer.  He has a right liver mass.  This occurs in the setting of cirrhosis, a history of hepatitis C, and a markedly elevated AFP.  I reviewed the abdominal MRI findings with him.  I have a high clinical suspicion for hepatocellular carcinoma as opposed to cholangiocarcinoma.  He will not be a biopsy candidate as he will require anticoagulation following the cardiac procedures.  I will present his case at the GI tumor conference.  He may be a candidate for hepatic embolization.  Recommendations: 1.  Continue Zytiga and prednisone 2.  Continue oxycodone as needed for pain related to the liver mass 3.  Outpatient follow-up will be scheduled at the Cancer center 4.  Please call oncology as needed    LOS: 2 days   GaBetsy CoderDNP, AGPCNP-BC, AOCNP 12/22/20   Edgar Mooney well-known to me with a history metastatic prostate cancer, currently maintained on abiraterone/prednisone and Lupron.  There has been marked clinical improvement since beginning hormonal therapy for prostate cancer.  He is now admitted with chest pain and a possible NSTEMI.  He is scheduled for cardiac catheterization tomorrow.  He was recently found to have a liver mass with a markedly elevated AFP.  He is scheduled for an MRI of the liver.  If this confirms changes of hepatocellular carcinoma he will not need a liver biopsy.  He will most likely be treated with hepatic  embolization therapy.  I see no contraindication for anticoagulation with regard to the history of prostate cancer.  However, he may be at increased risk for bleeding in the setting of cirrhosis.  I was present for greater than 50% of today's visit.  I performed medical stage making.  BrJulieanne MansonMD

## 2020-12-22 NOTE — Progress Notes (Signed)
CSW received consult for substance resources for patient. CSW met with patient at bedside. CSW offered patient outpatient substance use treatment services resources. Patient accepted.

## 2020-12-23 ENCOUNTER — Other Ambulatory Visit (HOSPITAL_COMMUNITY): Payer: Self-pay

## 2020-12-23 DIAGNOSIS — F1414 Cocaine abuse with cocaine-induced mood disorder: Secondary | ICD-10-CM

## 2020-12-23 DIAGNOSIS — F1011 Alcohol abuse, in remission: Secondary | ICD-10-CM

## 2020-12-23 DIAGNOSIS — I10 Essential (primary) hypertension: Secondary | ICD-10-CM

## 2020-12-23 LAB — CBC
HCT: 31.9 % — ABNORMAL LOW (ref 39.0–52.0)
Hemoglobin: 10.9 g/dL — ABNORMAL LOW (ref 13.0–17.0)
MCH: 31.6 pg (ref 26.0–34.0)
MCHC: 34.2 g/dL (ref 30.0–36.0)
MCV: 92.5 fL (ref 80.0–100.0)
Platelets: 150 10*3/uL (ref 150–400)
RBC: 3.45 MIL/uL — ABNORMAL LOW (ref 4.22–5.81)
RDW: 12.4 % (ref 11.5–15.5)
WBC: 7.1 10*3/uL (ref 4.0–10.5)
nRBC: 0 % (ref 0.0–0.2)

## 2020-12-23 LAB — BASIC METABOLIC PANEL
Anion gap: 9 (ref 5–15)
BUN: 17 mg/dL (ref 6–20)
CO2: 21 mmol/L — ABNORMAL LOW (ref 22–32)
Calcium: 9.3 mg/dL (ref 8.9–10.3)
Chloride: 106 mmol/L (ref 98–111)
Creatinine, Ser: 0.69 mg/dL (ref 0.61–1.24)
GFR, Estimated: >60 mL/min (ref >60)
Glucose, Bld: 120 mg/dL — ABNORMAL HIGH (ref 70–99)
Potassium: 3.4 mmol/L — ABNORMAL LOW (ref 3.5–5.1)
Sodium: 136 mmol/L (ref 135–145)

## 2020-12-23 LAB — POCT ACTIVATED CLOTTING TIME: Activated Clotting Time: 283 seconds

## 2020-12-23 MED ORDER — AMLODIPINE BESYLATE 5 MG PO TABS
5.0000 mg | ORAL_TABLET | Freq: Every day | ORAL | Status: DC
  Administered 2020-12-23: 5 mg via ORAL
  Filled 2020-12-23: qty 1

## 2020-12-23 MED ORDER — NITROGLYCERIN 0.4 MG SL SUBL
0.4000 mg | SUBLINGUAL_TABLET | SUBLINGUAL | 12 refills | Status: DC | PRN
  Filled 2020-12-23: qty 25, 8d supply, fill #0

## 2020-12-23 MED ORDER — ROSUVASTATIN CALCIUM 20 MG PO TABS
20.0000 mg | ORAL_TABLET | Freq: Every day | ORAL | 0 refills | Status: DC
  Filled 2020-12-23: qty 90, 90d supply, fill #0

## 2020-12-23 MED ORDER — ASPIRIN 81 MG PO CHEW
81.0000 mg | CHEWABLE_TABLET | Freq: Every day | ORAL | 3 refills | Status: DC
  Filled 2020-12-23: qty 90, 90d supply, fill #0
  Filled 2021-01-25 – 2021-10-16 (×2): qty 90, 90d supply, fill #1
  Filled 2021-10-16: qty 90, 90d supply, fill #0

## 2020-12-23 MED ORDER — ROSUVASTATIN CALCIUM 20 MG PO TABS
20.0000 mg | ORAL_TABLET | Freq: Every day | ORAL | Status: DC
  Administered 2020-12-23: 20 mg via ORAL
  Filled 2020-12-23: qty 1

## 2020-12-23 MED ORDER — AMLODIPINE BESYLATE 5 MG PO TABS
5.0000 mg | ORAL_TABLET | Freq: Every day | ORAL | 3 refills | Status: DC
  Filled 2020-12-23: qty 90, 90d supply, fill #0
  Filled 2021-01-25: qty 90, 90d supply, fill #1

## 2020-12-23 MED ORDER — TICAGRELOR 90 MG PO TABS
90.0000 mg | ORAL_TABLET | Freq: Two times a day (BID) | ORAL | 3 refills | Status: DC
  Filled 2020-12-23: qty 120, 60d supply, fill #0
  Filled 2021-01-25: qty 120, 60d supply, fill #1
  Filled 2021-10-16: qty 180, 90d supply, fill #0
  Filled 2021-10-16: qty 120, 60d supply, fill #1

## 2020-12-23 NOTE — Progress Notes (Signed)
Progress Note  Patient Name: Edgar Mooney Date of Encounter: 12/23/2020  [facility] Cardiologist: Dr Johnsie Cancel  Subjective   No CP or dyspnea  Inpatient Medications    Scheduled Meds:  abiraterone acetate  1,000 mg Oral Daily   aspirin  81 mg Oral Daily   isosorbide mononitrate  60 mg Oral Daily   predniSONE  5 mg Oral Q breakfast   sodium chloride flush  3 mL Intravenous Q12H   sodium chloride flush  3 mL Intravenous Q12H   tamsulosin  0.4 mg Oral Daily   ticagrelor  90 mg Oral BID   Continuous Infusions:  sodium chloride 75 mL/hr at 12/22/20 0538   sodium chloride     PRN Meds: sodium chloride, acetaminophen, diazepam, nitroGLYCERIN, ondansetron (ZOFRAN) IV, oxyCODONE-acetaminophen, sodium chloride flush, sodium chloride flush   Vital Signs    Vitals:   12/22/20 1300 12/22/20 1400 12/22/20 2251 12/23/20 0539  BP: (!) 141/72  (!) 143/72 (!) 154/79  Pulse: 60 63 60 60  Resp: '19 19  18  '$ Temp: 98.6 F (37 C)  98.7 F (37.1 C) 98.4 F (36.9 C)  TempSrc: Oral  Oral Oral  SpO2: 98% 99% 98% 99%  Weight:    74.9 kg  Height:        Intake/Output Summary (Last 24 hours) at 12/23/2020 [id number] Last data filed at 12/22/2020 2145 Gross per 24 hour  Intake 318.75 ml  Output 400 ml  Net -81.25 ml   Last 3 Weights 12/23/2020 12/22/2020 12/21/2020  Weight (lbs) 165 lb 2 oz 163 lb 8 oz 166 lb 10.7 oz  Weight (kg) 74.9 kg 74.163 kg 75.6 kg  Some encounter information is confidential and restricted. Go to Review Flowsheets activity to see all data.      Telemetry    Sinus - Personally Reviewed  Physical Exam   GEN: No acute distress.   Neck: No JVD Cardiac: RRR, no murmurs, rubs, or gallops.  Respiratory: Clear to auscultation bilaterally. GI: Soft, nontender, non-distended  MS: No edema; radial cath site with no hematoma Neuro:  Nonfocal  Psych: Normal affect   Labs    High Sensitivity Troponin:   Recent Labs  Lab 12/19/20 1158 12/19/20 1428 12/21/20 1215  12/21/20 1355  TROPONINIHS 46* 97* 28* 48*      Chemistry Recent Labs  Lab 12/19/20 1158 12/21/20 0102 12/22/20 0210 12/23/20 0225  NA 138 136 138 136  K 3.8 4.1 4.1 3.4*  CL 111 106 108 106  CO2 20* 21* 22 21*  GLUCOSE 106* 110* 109* 120*  BUN 14 26* 20 17  CREATININE 0.71 0.91 0.84 0.69  CALCIUM 9.2 9.3 9.3 9.3  PROT 6.2* 5.9*  --   --   ALBUMIN 3.3* 3.3*  --   --   AST 34 34  --   --   ALT 38 35  --   --   ALKPHOS 62 43  --   --   BILITOT 0.4 0.5  --   --   GFRNONAA >60 >60 >60 >60  ANIONGAP '7 9 8 9     '$ Hematology Recent Labs  Lab 12/21/20 0102 12/22/20 0210 12/23/20 0225  WBC 7.1 6.9 7.1  RBC 3.68* 3.69* 3.45*  HGB 11.8* 11.7* 10.9*  HCT 33.9* 34.2* 31.9*  MCV 92.1 92.7 92.5  MCH 32.1 31.7 31.6  MCHC 34.8 34.2 34.2  RDW 12.2 12.6 12.4  PLT 169 167 150     Radiology  CARDIAC CATHETERIZATION  Result Date: 12/22/2020   Ost RCA to Prox RCA lesion is 80% stenosed.   Prox RCA lesion is 70% stenosed.   Mid RCA lesion is 40% stenosed.   A drug-eluting stent was successfully placed using a STENT ONYX FRONTIER 3.0X38.   Post intervention, there is a 0% residual stenosis.   Post intervention, there is a 0% residual stenosis.   Post intervention, there is a 0% residual stenosis. Severe stenosis proximal to mid RCA Successful PTCA/DES x 1 proximal to mid RCA Continue DAPT with ASA and Brilinta for 12 months. Continue statin and beta blocker.   CARDIAC CATHETERIZATION  Result Date: 12/21/2020   1st Mrg lesion is 40% stenosed.   2nd Mrg lesion is 60% stenosed.   Mid Cx to Dist Cx lesion is 30% stenosed.   Ramus-1 lesion is 50% stenosed.   Ramus-2 lesion is 80% stenosed.   2nd Diag-1 lesion is 50% stenosed.   2nd Diag-2 lesion is 70% stenosed.   Mid LAD lesion is 60% stenosed.   Prox LAD to Mid LAD lesion is 99% stenosed.   Ost RCA to Prox RCA lesion is 80% stenosed.   Prox RCA lesion is 70% stenosed.   Mid RCA lesion is 40% stenosed.   A stent was successfully placed.    Post intervention, there is a 0% residual stenosis. 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 LVEDP 16 mmHg. At the start of the intervention prior to left main engagement, the patient began to experience severe chest pain with ST segment elevation of approximately 2 to 3 mm.  Following successful intervention, the subtotal LAD occlusion was reduced to 0% with ultimate insertion of a 2.75 x 15 mm Medtronic Onyx frontiers stent postdilated to 3.96 mm with resolution of chest pain and ECG changes and with restoration of brisk TIMI-3 flow. RECOMMENDATION: DAPT for minimum of 1 year if possible.  With his culprit lesion intervention today, plan stabilization and staged PCI to his RCA either tomorrow or the next day.  Medical therapy for concomitant CAD.  Initiate statin therapy if approved by hematology.    Patient Profile     55 y.o. male with past medical history of substance abuse, hepatitis C, metastatic prostate cancer, recent diagnosis of probable hepatocellular carcinoma admitted with non-ST elevation myocardial infarction.  Echocardiogram showed normal LV function with inferior hypokinesis, mild to moderate left atrial enlargement.  Patient subsequently had PCI of his LAD followed by staged PCI of his right coronary artery.  Assessment & Plan    1 non-ST elevation myocardial infarction-patient doing well status post PCI of the LAD and RCA.  Continue aspirin and Brilinta.  We will add Crestor 20 mg daily.  We have not added  beta-blockade given cocaine noted on admission drug screen.  Also note LV function is normal.  2 hypertension-patient's blood pressure is elevated.  I will discontinue isosorbide and instead treat with amlodipine 5 mg daily.  Follow blood pressure as an outpatient and adjust medications as needed.  3  hyperlipidemia-I am adding Crestor 20 mg daily.  Check lipids and liver in 12 weeks.  4 probably newly diagnosed hepatocellular carcinoma versus cholangiocarcinoma-Follow-up oncology.  5 substance abuse-patient counseled on discontinuing.  Patient can be discharged from cardiac standpoint.  I will arrange follow-up with APP in 2 to 4 weeks.  Follow-up Dr. Liliane Channel 3 to 6 months.  We will sign off.  Please call with questions.  For questions or updates, please contact [place] Please consult [email or web address] for contact info under        Signed, Kirk Ruths, MD  12/23/2020, 7:02 AM

## 2020-12-23 NOTE — Progress Notes (Signed)
CARDIAC REHAB PHASE I   Offered to walk with pt. Pt declines at this time. Pt educated on importance ASA, Brilinta, and NTG. Pt given stent given along with heart healthy diet. Reviewed site care, restrictions, and exercise guidelines. Spoke at length about cessation from alcohol, smoking, and drugs. Pt states he is not ready to quit smoking, but will cut back on alcohol, and try to stay away from drug use. Provided support and encouragement. Will refer to CRP II [place] to meet the requirement, but pt not interested in attending.  TA:[id number] Rufina Falco, RN BSN 12/23/2020 10:14 AM

## 2020-12-23 NOTE — Plan of Care (Signed)

## 2020-12-23 NOTE — Discharge Summary (Signed)
Physician Discharge Summary  Edgar [facility] [id number] DOB: [birth year] DOA: 12/19/2020  PCP: Patient, No Pcp Per (Inactive)  Admit date: 12/19/2020 Discharge date: 12/23/2020  Admitted From: Home Disposition:  Home  Recommendations for Outpatient Follow-up:  Follow up with PCP in 1-2 weeks Please obtain BMP/CBC in one week   Home Health:no Equipment/Devices:none  Discharge Condition:Stable CODE STATUS:Full Diet recommendation: Heart Healthy  Brief/Interim Summary: 55 y.o. male past medical history significant for CAD, alcohol abuse, chronic hepatitis C, polysubstance abuse including cocaine with metastatic prostate cancer to the bone presents to the [facility] on 12/19/2020 for chest pain substernal associated with difficulty breathing.  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 Cardiology was consulted he was started on aspirin and statin, beta-blocker was not started due to his cocaine use. Cardiac enzymes were positive he was started on IV heparin cardiology recommended a cardiac cath that showed severe proximal RCA with a successful PCI stent placed. They recommended to continue DAPT (aspirin and Brilinta) for 12 months. He will be discharged home on aspirin, Crestor and nitroglycerin as needed. Not on a beta-blocker due to his cocaine abuse.  Will be reevaluated as an outpatient.  He has been counseled about stopping and not using cocaine.  Static prostate cancer with osseous metastases: Dr. Malachy [facility] was consulted he had a new liver mass MRI was done there was a concern for hepatocellular carcinoma. Oncology related that there was  no contraindication to proceed with anticoagulation.  Liver mass: MRI of the liver favors intrahepatic cholangiocarcinoma although hepatocellular carcinoma is also in the differential. He will follow-up with oncology as an outpatient.  Essential hypertension: He was started on oral Norvasc not on beta-blockers due to cocaine abuse. Blood pressure relatively well controlled.  Cocaine abuse: Not a candidate for beta-blockers, he was counseled.  Alcohol abuse: Counseled.  History of gout: No changes made to his medication.   Discharge Instructions  Discharge Instructions     Diet - low sodium heart healthy   Complete by: As directed    Increase activity slowly   Complete by: As directed       Allergies as of 12/23/2020   No Known Allergies      Medication List     TAKE these medications    abiraterone acetate 250 MG tablet Commonly known as: ZYTIGA TAKE 4 TABLETS (1,000 MG TOTAL) BY MOUTH DAILY. TAKE ON AN EMPTY STOMACH 1 HOUR BEFORE OR 2 HOURS AFTER A MEAL What changed: how much to take   allopurinol 100 MG tablet Commonly known as: ZYLOPRIM TAKE 1 TABLET BY MOUTH EVERY DAY START ON 12/24   amLODipine 5 MG tablet Commonly known as: NORVASC Take 1 tablet (5 mg total) by mouth daily. Start taking on: December 24, 2020   aspirin 81 MG chewable tablet Chew 1 tablet (81 mg total) by mouth daily. Start taking on: December 24, 2020   ferrous gluconate 324 MG tablet Commonly known as: FERGON Take 1 tablet (324 mg total) by mouth 2 (two) times daily with a meal.   indomethacin 50 MG capsule Commonly known as: INDOCIN Take 1  capsule (50 mg total) by mouth 3 (three) times daily with meals. For gout flare   nitroGLYCERIN 0.4 MG SL tablet Commonly known as: NITROSTAT Place 1 tablet (0.4 mg total) under the tongue every 5 (five) minutes as needed for chest pain.   ondansetron 4 MG tablet Commonly known as: ZOFRAN Take 1 tablet (4 mg total) by mouth every 6  (six) hours as needed for nausea.   oxyCODONE-acetaminophen 5-325 MG tablet Commonly known as: PERCOCET/ROXICET Take 1 tablet by mouth every 4 (four) hours as needed for severe pain.   predniSONE 5 MG tablet Commonly known as: DELTASONE TAKE 1 TABLET (5 MG TOTAL) BY MOUTH DAILY WITH BREAKFAST. What changed: how much to take   rosuvastatin 20 MG tablet Commonly known as: CRESTOR Take 1 tablet (20 mg total) by mouth daily. Start taking on: December 24, 2020   tamsulosin 0.4 MG Caps capsule Commonly known as: FLOMAX TAKE 1 CAPSULE BY MOUTH EVERY DAY AFTER SUPPER What changed: See the new instructions.   ticagrelor 90 MG Tabs tablet Commonly known as: BRILINTA Take 1 tablet (90 mg total) by mouth 2 (two) times daily.        Follow-up Information     Edgar Hector, MD Follow up on 01/06/2021.   Specialty: Cardiology Why: at 1:45PM for follow up with cardiology Contact information: [address] [phone]                No Known Allergies  Consultations: Cardiology Oncology.   Procedures/Studies: DG Chest 2 View  Result Date: 12/19/2020 CLINICAL DATA:  Chest pain, diaphoresis EXAM: CHEST - 2 VIEW COMPARISON:  03/03/2020 FINDINGS: Lower lung volumes with similar diffuse bilateral interstitial opacities may represent chronic lung disease. Minor basilar atelectasis. Normal heart size and vascularity. No definite focal pneumonia, collapse or consolidation. Negative for effusion or pneumothorax. Trachea midline. Degenerative changes of the spine. IMPRESSION: Similar chronic interstitial lung changes with lower lung volumes. No definite superimposed acute process. Electronically Signed   By: [name].  Shick M.D.   On: 12/19/2020 12:45   MR Cervical Spine W or Wo Contrast  Result Date: 12/20/2020 CLINICAL DATA:  Initial evaluation for intermittent left-sided chest pain, history of metastatic prostate cancer. EXAM: MRI CERVICAL,  THORACIC AND LUMBAR SPINE WITHOUT AND WITH CONTRAST TECHNIQUE: Multiplanar and multiecho pulse sequences of the cervical spine, to include the craniocervical junction and cervicothoracic junction, and thoracic and lumbar spine, were obtained without and with intravenous contrast. CONTRAST:  36m GADAVIST GADOBUTROL 1 MMOL/ML IV SOLN COMPARISON:  Prior MRIs from 03/04/2020. FINDINGS: MRI CERVICAL SPINE FINDINGS Alignment: Mild straightening of the normal cervical lordosis. No listhesis. Vertebrae: Diffuse infiltrative low marrow signal intensity again seen throughout the visualized skull base and spine. Previously seen superimposed enhancing lesions are markedly improved as compared to previous exam, compatible with interval response to therapy. Appearance is most evident at the posterior arch of C2 where only a small focus of enhancement is now seen on today's exam, previously fairly prominent on prior MRI. No definite new or progressive lesions. Vertebral body height maintained without pathologic or interval fracture. Previously question extra osseous tumor on the right at C5-6 is not convincingly seen on today's exam. No other new extra osseous extension. No epidural or intracanalicular involvement identified. Cord: Normal signal and morphology. No abnormal enhancement or intracanalicular involvement of tumor. Posterior Fossa, vertebral arteries, paraspinal tissues: Empty sella partially visualized. Visualized portions of the brain and posterior fossa otherwise unremarkable.  Paraspinous and prevertebral soft tissues within normal limits. Normal flow voids seen within the vertebral arteries bilaterally. Disc levels: C2-C3: Negative interspace. Mild left-sided facet hypertrophy. No stenosis. C3-C4: Broad posterior disc osteophyte flattens the ventral thecal sac with resultant mild-to-moderate spinal stenosis. Superimposed uncovertebral and facet hypertrophy with resultant moderate left with mild right C4 foraminal  stenosis. C4-C5: Mild disc bulge with uncovertebral spurring. Bilateral facet hypertrophy. No significant spinal stenosis. Moderate right with mild left C5 foraminal narrowing. C5-C6: Degenerative intervertebral disc space narrowing with diffuse disc osteophyte complex. Flattening and partial effacement of the ventral thecal sac, eccentric to the right. Mild spinal stenosis. Severe right worse than left C6 foraminal narrowing. C6-C7: Disc bulge with uncovertebral spurring. Superimposed left paracentral disc protrusion indents the left ventral thecal sac. Mild spinal stenosis. Mild to moderate left with mild right C7 foraminal narrowing. C7-T1:  Normal interspace.  No canal or foraminal stenosis. MRI THORACIC SPINE FINDINGS Alignment: Physiologic with preservation of the normal thoracic kyphosis. No listhesis. Vertebrae: Diffusely abnormal appearance of the visualized osseous structures, consistent with widespread osseous metastatic disease. Overall appearance is markedly improved as compared to previous exam, with decreased heterogeneous enhancement and STIR signal abnormality seen throughout the thoracic spine. Previously seen trace epidural tumor posterior to the T12 vertebral body is no longer visualized. No new extra osseous or epidural tumor identified. Vertebral body height maintained without pathologic or interval fracture. Cord: Normal signal and morphology. No epidural or intracanalicular tumor. No abnormal enhancement. Paraspinal and other soft tissues: Paraspinous soft tissues within normal limits. Splenomegaly partially visualized. Disc levels: No new significant disc pathology. No significant stenosis or neural impingement. MRI LUMBAR SPINE FINDINGS Segmentation: Standard. Lowest well-formed disc space labeled the L5-S1 level. Alignment: Physiologic with preservation of the normal lumbar lordosis. No listhesis. Vertebrae: Widespread osseous metastatic disease again seen throughout the visualized lumbar  spine and pelvis. Overall, appearance is markedly improved with decreased heterogeneous enhancement seen throughout the affected structures as compared to prior. Vertebral body height maintained without interval or pathologic fracture. Previously seen epidural tumor posterior to the S1 vertebral body has largely resolved, and is no longer seen. Similarly, previously seen extraosseous extension from the bilateral ilium is also no longer clearly visualized. No significant extraosseous extension now seen. No significant epidural or intracanalicular involvement. Conus medullaris and cauda equina: Conus extends to the T12 level. Conus and cauda equina appear normal. Paraspinal and other soft tissues: Interval resolution in previously seen bulky retroperitoneal adenopathy. Disc levels: L1-2:  Unremarkable. L2-3:  Negative interspace.  Mild facet hypertrophy.  No stenosis. L3-4: Disc bulge with mild facet hypertrophy. No spinal stenosis. Mild left L3 foraminal narrowing. L4-5: Degenerative intervertebral disc space narrowing with diffuse disc bulge and reactive endplate spurring. Mild to moderate facet hypertrophy. Resultant mild canal with moderate bilateral lateral recess stenosis. Moderate right with mild left L4 foraminal narrowing. L5-S1: Degenerative intervertebral disc space narrowing with diffuse disc bulge and reactive endplate spurring. Superimposed left subarticular disc protrusion contacts and mildly displaces the descending left S1 nerve root. Mild-to-moderate facet hypertrophy. Resultant moderate left with mild right lateral recess stenosis. Mild bilateral L5 foraminal narrowing. IMPRESSION: MRI CERVICAL SPINE IMPRESSION: 1. Interval improvement in appearance of widespread osseous metastatic disease, consistent with interval response to therapy. No pathologic fracture. Previously seen extraosseous/epidural tumor at C5-6 is no longer clearly visualized, with no extraosseous or epidural tumor now seen. 2.  Underlying multilevel cervical spondylosis with resultant mild-to-moderate spinal stenosis at C3-4 through C6-7 as above. Moderate left C4 and  right C5 foraminal stenosis, with severe bilateral C6 foraminal narrowing. MRI THORACIC SPINE IMPRESSION: Marked interval improvement in appearance of widespread osseous metastatic disease, consistent with interval response to therapy. No pathologic fracture. No extra osseous or epidural tumor identified. MRI LUMBAR SPINE IMPRESSION: 1. Interval improvement in widespread osseous metastatic disease involving the lumbar spine and visualized pelvis. No pathologic fracture. 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 No significant epidural or extraosseous disease now seen. 2. Interval improvement/resolution in previously seen retroperitoneal adenopathy. 3. Left subarticular disc protrusion at L5-S1, potentially affecting the left S1 nerve root. Additional more mild degenerative spondylosis elsewhere within the lumbar spine as above. Electronically Signed   By: Jeannine Boga M.D.   On: 12/20/2020 05:37   MR THORACIC SPINE W WO CONTRAST  Result Date: 12/20/2020 CLINICAL DATA:  Initial evaluation for intermittent left-sided chest pain, history of metastatic prostate cancer. EXAM: MRI CERVICAL, THORACIC AND LUMBAR SPINE WITHOUT AND WITH CONTRAST TECHNIQUE: Multiplanar and multiecho pulse sequences of the cervical spine, to include the craniocervical junction and cervicothoracic junction, and thoracic and lumbar spine, were obtained without and with intravenous contrast. CONTRAST:  66m GADAVIST GADOBUTROL 1 MMOL/ML IV SOLN COMPARISON:  Prior MRIs from 03/04/2020. FINDINGS: MRI CERVICAL SPINE FINDINGS Alignment: Mild straightening of the normal cervical lordosis. No listhesis. Vertebrae: Diffuse infiltrative low marrow signal intensity again seen throughout the visualized  skull base and spine. Previously seen superimposed enhancing lesions are markedly improved as compared to previous exam, compatible with interval response to therapy. Appearance is most evident at the posterior arch of C2 where only a small focus of enhancement is now seen on today's exam, previously fairly prominent on prior MRI. No definite new or progressive lesions. Vertebral body height maintained without pathologic or interval fracture. Previously question extra osseous tumor on the right at C5-6 is not convincingly seen on today's exam. No other new extra osseous extension. No epidural or intracanalicular involvement identified. Cord: Normal signal and morphology. No abnormal enhancement or intracanalicular involvement of tumor. Posterior Fossa, vertebral arteries, paraspinal tissues: Empty sella partially visualized. Visualized portions of the brain and posterior fossa otherwise unremarkable. Paraspinous and prevertebral soft tissues within normal limits. Normal flow voids seen within the vertebral arteries bilaterally. Disc levels: C2-C3: Negative interspace. Mild left-sided facet hypertrophy. No stenosis. C3-C4: Broad posterior disc osteophyte flattens the ventral thecal sac with resultant mild-to-moderate spinal stenosis. Superimposed uncovertebral and facet hypertrophy with resultant moderate left with mild right C4 foraminal stenosis. C4-C5: Mild disc bulge with uncovertebral spurring. Bilateral facet hypertrophy. No significant spinal stenosis. Moderate right with mild left C5 foraminal narrowing. C5-C6: Degenerative intervertebral disc space narrowing with diffuse disc osteophyte complex. Flattening and partial effacement of the ventral thecal sac, eccentric to the right. Mild spinal stenosis. Severe right worse than left C6 foraminal narrowing. C6-C7: Disc bulge with uncovertebral spurring. Superimposed left paracentral disc protrusion indents the left ventral thecal sac. Mild spinal stenosis. Mild  to moderate left with mild right C7 foraminal narrowing. C7-T1:  Normal interspace.  No canal or foraminal stenosis. MRI THORACIC SPINE FINDINGS Alignment: Physiologic with preservation of the normal thoracic kyphosis. No listhesis. Vertebrae: Diffusely abnormal appearance of the visualized osseous structures, consistent with widespread osseous metastatic disease. Overall appearance is markedly improved as compared to previous exam, with decreased heterogeneous enhancement and STIR signal abnormality seen throughout the thoracic spine. Previously seen trace epidural tumor posterior to the T12 vertebral  body is no longer visualized. No new extra osseous or epidural tumor identified. Vertebral body height maintained without pathologic or interval fracture. Cord: Normal signal and morphology. No epidural or intracanalicular tumor. No abnormal enhancement. Paraspinal and other soft tissues: Paraspinous soft tissues within normal limits. Splenomegaly partially visualized. Disc levels: No new significant disc pathology. No significant stenosis or neural impingement. MRI LUMBAR SPINE FINDINGS Segmentation: Standard. Lowest well-formed disc space labeled the L5-S1 level. Alignment: Physiologic with preservation of the normal lumbar lordosis. No listhesis. Vertebrae: Widespread osseous metastatic disease again seen throughout the visualized lumbar spine and pelvis. Overall, appearance is markedly improved with decreased heterogeneous enhancement seen throughout the affected structures as compared to prior. Vertebral body height maintained without interval or pathologic fracture. Previously seen epidural tumor posterior to the S1 vertebral body has largely resolved, and is no longer seen. Similarly, previously seen extraosseous extension from the bilateral ilium is also no longer clearly visualized. No significant extraosseous extension now seen. No significant epidural or intracanalicular involvement. Conus medullaris and  cauda equina: Conus extends to the T12 level. Conus and cauda equina appear normal. Paraspinal and other soft tissues: Interval resolution in previously seen bulky retroperitoneal adenopathy. Disc levels: L1-2:  Unremarkable. L2-3:  Negative interspace.  Mild facet hypertrophy.  No stenosis. L3-4: Disc bulge with mild facet hypertrophy. No spinal stenosis. Mild left L3 foraminal narrowing. L4-5: Degenerative intervertebral disc space narrowing with diffuse disc bulge and reactive endplate spurring. Mild to moderate facet hypertrophy. Resultant mild canal with moderate bilateral lateral recess stenosis. Moderate right with mild left L4 foraminal narrowing. L5-S1: Degenerative intervertebral disc space narrowing with diffuse disc bulge and reactive endplate spurring. Superimposed left subarticular disc protrusion contacts and mildly displaces the descending left S1 nerve root. Mild-to-moderate facet hypertrophy. Resultant moderate left with mild right lateral recess stenosis. Mild bilateral L5 foraminal narrowing. IMPRESSION: MRI CERVICAL SPINE IMPRESSION: 1. Interval improvement in appearance of widespread osseous metastatic disease, consistent with interval response to therapy. No pathologic fracture. Previously seen extraosseous/epidural tumor at C5-6 is no longer clearly visualized, with no extraosseous or epidural tumor now seen. 2. Underlying multilevel cervical spondylosis with resultant mild-to-moderate spinal stenosis at C3-4 through C6-7 as above. Moderate left C4 and right C5 foraminal stenosis, with severe bilateral C6 foraminal narrowing. MRI THORACIC SPINE IMPRESSION: Marked interval improvement in appearance of widespread osseous metastatic disease, consistent with interval response to therapy. No pathologic fracture. No extra osseous or epidural tumor identified. MRI LUMBAR SPINE IMPRESSION: 1. Interval improvement in widespread osseous metastatic disease involving the lumbar spine and visualized  pelvis. No pathologic fracture. 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 No significant epidural or extraosseous disease now seen. 2. Interval improvement/resolution in previously seen retroperitoneal adenopathy. 3. Left subarticular disc protrusion at L5-S1, potentially affecting the left S1 nerve root. Additional more mild degenerative spondylosis elsewhere within the lumbar spine as above. Electronically Signed   By: Jeannine Boga M.D.   On: 12/20/2020 05:37   MR Lumbar Spine W Wo Contrast  Result Date: 12/20/2020 CLINICAL DATA:  Initial evaluation for intermittent left-sided chest pain, history of metastatic prostate cancer. EXAM: MRI CERVICAL, THORACIC AND LUMBAR SPINE WITHOUT AND WITH CONTRAST TECHNIQUE: Multiplanar and multiecho pulse sequences of the cervical spine, to include the craniocervical junction and cervicothoracic junction, and thoracic and lumbar spine, were obtained without and with intravenous contrast. CONTRAST:  43m GADAVIST GADOBUTROL 1 MMOL/ML IV SOLN COMPARISON:  Prior MRIs from 03/04/2020. FINDINGS: MRI CERVICAL SPINE FINDINGS Alignment: Mild straightening of the normal cervical lordosis. No listhesis. Vertebrae: Diffuse infiltrative low marrow signal intensity again seen throughout the visualized skull base and spine. Previously seen superimposed enhancing lesions are markedly improved as compared to previous exam, compatible with interval response to therapy. Appearance is most evident at the posterior arch of C2 where only a small focus of enhancement is now seen on today's exam, previously fairly prominent on prior MRI. No definite new or progressive lesions. Vertebral body height maintained without pathologic or interval fracture. Previously question extra osseous tumor on the right at C5-6 is not convincingly seen on today's exam. No other new extra osseous extension. No  epidural or intracanalicular involvement identified. Cord: Normal signal and morphology. No abnormal enhancement or intracanalicular involvement of tumor. Posterior Fossa, vertebral arteries, paraspinal tissues: Empty sella partially visualized. Visualized portions of the brain and posterior fossa otherwise unremarkable. Paraspinous and prevertebral soft tissues within normal limits. Normal flow voids seen within the vertebral arteries bilaterally. Disc levels: C2-C3: Negative interspace. Mild left-sided facet hypertrophy. No stenosis. C3-C4: Broad posterior disc osteophyte flattens the ventral thecal sac with resultant mild-to-moderate spinal stenosis. Superimposed uncovertebral and facet hypertrophy with resultant moderate left with mild right C4 foraminal stenosis. C4-C5: Mild disc bulge with uncovertebral spurring. Bilateral facet hypertrophy. No significant spinal stenosis. Moderate right with mild left C5 foraminal narrowing. C5-C6: Degenerative intervertebral disc space narrowing with diffuse disc osteophyte complex. Flattening and partial effacement of the ventral thecal sac, eccentric to the right. Mild spinal stenosis. Severe right worse than left C6 foraminal narrowing. C6-C7: Disc bulge with uncovertebral spurring. Superimposed left paracentral disc protrusion indents the left ventral thecal sac. Mild spinal stenosis. Mild to moderate left with mild right C7 foraminal narrowing. C7-T1:  Normal interspace.  No canal or foraminal stenosis. MRI THORACIC SPINE FINDINGS Alignment: Physiologic with preservation of the normal thoracic kyphosis. No listhesis. Vertebrae: Diffusely abnormal appearance of the visualized osseous structures, consistent with widespread osseous metastatic disease. Overall appearance is markedly improved as compared to previous exam, with decreased heterogeneous enhancement and STIR signal abnormality seen throughout the thoracic spine. Previously seen trace epidural tumor posterior to  the T12 vertebral body is no longer visualized. No new extra osseous or epidural tumor identified. Vertebral body height maintained without pathologic or interval fracture. Cord: Normal signal and morphology. No epidural or intracanalicular tumor. No abnormal enhancement. Paraspinal and other soft tissues: Paraspinous soft tissues within normal limits. Splenomegaly partially visualized. Disc levels: No new significant disc pathology. No significant stenosis or neural impingement. MRI LUMBAR SPINE FINDINGS Segmentation: Standard. Lowest well-formed disc space labeled the L5-S1 level. Alignment: Physiologic with preservation of the normal lumbar lordosis. No listhesis. Vertebrae: Widespread osseous metastatic disease again seen throughout the visualized lumbar spine and pelvis. Overall, appearance is markedly improved with decreased heterogeneous enhancement seen throughout the affected structures as compared to prior. Vertebral body height maintained without interval or pathologic fracture. Previously seen epidural tumor posterior to the S1 vertebral body has largely resolved, and is no longer seen. Similarly, previously seen extraosseous extension from the bilateral ilium is also no longer clearly visualized. No significant extraosseous extension now seen. No significant epidural or intracanalicular involvement. Conus medullaris and cauda equina: Conus extends to the T12 level. Conus and cauda equina appear normal. Paraspinal and other soft tissues: Interval resolution in previously seen bulky retroperitoneal adenopathy. Disc levels: L1-2:  Unremarkable. L2-3:  Negative interspace.  Mild facet hypertrophy.  No stenosis. L3-4:  Disc bulge with mild facet hypertrophy. No spinal stenosis. Mild left L3 foraminal narrowing. L4-5: Degenerative intervertebral disc space narrowing with diffuse disc bulge and reactive endplate spurring. Mild to moderate facet hypertrophy. Resultant mild canal with moderate bilateral lateral  recess stenosis. Moderate right with mild left L4 foraminal narrowing. L5-S1: Degenerative intervertebral disc space narrowing with diffuse disc bulge and reactive endplate spurring. Superimposed left subarticular disc protrusion contacts and mildly displaces the descending left S1 nerve root. Mild-to-moderate facet hypertrophy. Resultant moderate left with mild right lateral recess stenosis. Mild bilateral L5 foraminal narrowing. IMPRESSION: MRI CERVICAL SPINE IMPRESSION: 1. Interval improvement in appearance of widespread osseous metastatic disease, consistent with interval response to therapy. No pathologic fracture. Previously seen extraosseous/epidural tumor at C5-6 is no longer clearly visualized, with no extraosseous or epidural tumor now seen. 2. Underlying multilevel cervical spondylosis with resultant mild-to-moderate spinal stenosis at C3-4 through C6-7 as above. Moderate left C4 and right C5 foraminal stenosis, with severe bilateral C6 foraminal narrowing. MRI THORACIC SPINE IMPRESSION: Marked interval improvement in appearance of widespread osseous metastatic disease, consistent with interval response to therapy. No pathologic fracture. No extra osseous or epidural tumor identified. MRI LUMBAR SPINE IMPRESSION: 1. Interval improvement in widespread osseous metastatic disease involving the lumbar spine and visualized pelvis. No pathologic fracture. 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 No significant epidural or extraosseous disease now seen. 2. Interval improvement/resolution in previously seen retroperitoneal adenopathy. 3. Left subarticular disc protrusion at L5-S1, potentially affecting the left S1 nerve root. Additional more mild degenerative spondylosis elsewhere within the lumbar spine as above. Electronically Signed   By: Jeannine Boga M.D.   On: 12/20/2020 05:37   CT ABDOMEN PELVIS  W CONTRAST  Addendum Date: 12/10/2020   ADDENDUM REPORT: 12/10/2020 14:58 ADDENDUM: Of note, the hepatic mass could be further evaluated with a multiphase MR abdomen without and with contrast. Electronically Signed   By: Zerita Boers M.D.   On: 12/10/2020 14:58   Result Date: 12/10/2020 CLINICAL DATA:  Right lower quadrant pain. The patient has a history of hepatitis C cirrhosis and prostate cancer. EXAM: CT ABDOMEN AND PELVIS WITH CONTRAST TECHNIQUE: Multidetector CT imaging of the abdomen and pelvis was performed using the standard protocol following bolus administration of intravenous contrast. CONTRAST:  3m OMNIPAQUE IOHEXOL 350 MG/ML SOLN COMPARISON:  CT abdomen pelvis dated 03/03/2020. FINDINGS: Lower chest: Mild bilateral dependent atelectasis. Hepatobiliary: An enhancing mass in the right hepatic lobe measures 9.1 x 9.2 x 9.2 cm and appears to demonstrate washout enhancement on delayed phase imaging. This was not seen on prior exam. No gallstones, gallbladder wall thickening, or biliary dilatation. Pancreas: Calcifications in the head of the pancreas are unchanged and likely reflect chronic pancreatitis. No pancreatic ductal dilatation or surrounding inflammatory changes. Spleen: Normal in size without focal abnormality. Adrenals/Urinary Tract: Adrenal glands are unremarkable. Kidneys are normal, without renal calculi, focal lesion, or hydronephrosis. The urinary bladder wall appears thickened for the degree of distension. Stomach/Bowel: Stomach is within normal limits. Appendix appears normal. No evidence of bowel wall thickening, distention, or inflammatory changes. Vascular/Lymphatic: Aortic atherosclerosis. A lymph node to the left of the inferior vena cava near the porta hepatis measures 1.2 cm in short axis (series 3, image 24 and may be slightly enlarged compared to 03/03/2020. No enlarged pelvic lymph nodes. Reproductive: Prostate is unremarkable. Other: There is a small amount of peritoneal  fluid near the  hepatic mass extending along the right pericolic gutter. No abdominal wall hernia or abnormality. Musculoskeletal: Numerous sclerotic lesions throughout the spine and pelvis appear progressed since prior exam. Degenerative changes are seen in the spine. Previously seen metastatic disease involving the ventral epidural space at the level of S1 is not appreciated on today's exam. IMPRESSION: 1. Enhancing mass in the right hepatic lobe is concerning for hepatocellular carcinoma given the apparent washout on delayed phase and reported history of hepatitis C cirrhosis. Metastatic disease from the patient's reported prostate cancer is considered less likely. 2. Urinary bladder wall thickening for the degree of distension may reflect cystitis or chronic bladder outlet obstruction. 3. Numerous sclerotic lesions throughout the spine and pelvis appear progressed since 03/03/2020, likely representing metastatic disease. Electronically Signed: By: Zerita Boers M.D. On: 12/10/2020 14:53   CARDIAC CATHETERIZATION  Result Date: 12/22/2020   Ost RCA to Prox RCA lesion is 80% stenosed.   Prox RCA lesion is 70% stenosed.   Mid RCA lesion is 40% stenosed.   A drug-eluting stent was successfully placed using a STENT ONYX FRONTIER [id number].   Post intervention, there is a 0% residual stenosis.   Post intervention, there is a 0% residual stenosis.   Post intervention, there is a 0% residual stenosis. Severe stenosis proximal to mid RCA Successful PTCA/DES x 1 proximal to mid RCA Continue DAPT with ASA and Brilinta for 12 months. Continue statin and beta blocker.   CARDIAC CATHETERIZATION  Result Date: 12/21/2020   1st Mrg lesion is 40% stenosed.   2nd Mrg lesion is 60% stenosed.   Mid Cx to Dist Cx lesion is 30% stenosed.   Ramus-1 lesion is 50% stenosed.   Ramus-2 lesion is 80% stenosed.   2nd Diag-1 lesion is 50% stenosed.   2nd Diag-2 lesion is 70% stenosed.   Mid LAD lesion is 60% stenosed.   Prox LAD to Mid LAD  lesion is 99% stenosed.   Ost RCA to Prox RCA lesion is 80% stenosed.   Prox RCA lesion is 70% stenosed.   Mid RCA lesion is 40% stenosed.   A stent was successfully placed.   Post intervention, there is a 0% residual stenosis. 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 LVEDP 16 mmHg. At the start of the intervention prior to left main engagement, the patient began to experience severe chest pain with ST segment elevation of approximately 2 to 3 mm.  Following successful intervention, the subtotal LAD occlusion was reduced to 0% with ultimate insertion of a 2.75 x 15 mm Medtronic Onyx frontiers stent postdilated to 3.96 mm with resolution of chest pain and ECG changes and with restoration of brisk TIMI-3 flow. RECOMMENDATION: DAPT for minimum of 1 year if possible.  With his culprit lesion intervention today, plan stabilization and staged PCI to his RCA either tomorrow or the next day.  Medical therapy for concomitant CAD.  Initiate statin therapy if approved by hematology.  MR LIVER W WO CONTRAST  Result Date: 12/21/2020 CLINICAL DATA:  Characterize liver mass  EXAM: MRI ABDOMEN WITHOUT AND WITH CONTRAST TECHNIQUE: Multiplanar multisequence MR imaging of the abdomen was performed both before and after the administration of intravenous contrast. CONTRAST:  4m GADAVIST GADOBUTROL 1 MMOL/ML IV SOLN COMPARISON:  CT abdomen pelvis, 12/10/2020 FINDINGS: Lower chest: No acute findings. Hepatobiliary: There is a large, contrast hypoenhancing  mass of the inferior right lobe of the liver, hepatic segment VI, measuring 9.4 x 8.7 cm (series 21, image 50). This appears to occlude or at least compress the adjacent branch right portal veins, without definite evidence of tumor thrombus. Numerous tiny gallstones in the dependent gallbladder. No gallbladder wall thickening. No biliary ductal dilatation. Pancreas: No mass, inflammatory changes, or other parenchymal abnormality identified. No pancreatic ductal dilatation. Spleen:  Within normal limits in size and appearance. Adrenals/Urinary Tract: No masses identified. No evidence of hydronephrosis. Stomach/Bowel: Visualized portions within the abdomen are unremarkable. Vascular/Lymphatic: No pathologically enlarged lymph nodes identified. No abdominal aortic aneurysm demonstrated. Other:  None. Musculoskeletal: Diffusely heterogeneous, contrast enhancing vertebral bone marrow. IMPRESSION: 1. There is a large, contrast hypoenhancing mass of the inferior right lobe of the liver, hepatic segment VI, measuring 9.4 x 8.7 cm, which appears to occlude or at least compress the adjacent branch right portal veins, without definite evidence of tumor thrombus. Appearance and contrast enhancement characteristics favor intrahepatic cholangiocarcinoma, although hepatocellular carcinoma is a differential consideration, particularly in the high risk setting of chronic hepatitis C. Prostate metastatic disease is not favored. LI-RADS category M. 2. 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 3. No lymphadenopathy or evidence of organ metastatic disease in the abdomen. 4.  Cholelithiasis without evidence of acute cholecystitis. Electronically Signed   By: AEddie CandleM.D.   On: 12/21/2020 07:49   ECHOCARDIOGRAM COMPLETE  Result Date: 12/19/2020    ECHOCARDIOGRAM REPORT   Patient Name:   GERUBIEL SCHMAHLMANNING Date of Exam: 12/19/2020 Medical Rec #:   [id number]    Height:       62.0 in Accession #:    [id number]   Weight:       160.0 lb Date of Birth:  [birth year]    BSA:          1.739 m Patient Age:    89years      BP:           160/79 mmHg Patient Gender: M             HR:           57 bpm. Exam Location:  Inpatient Procedure: 2D Echo Indications:    chest pain  History:        Patient has no prior history of Echocardiogram examinations.                 Risk Factors:Hypertension and polysubstance abuse.  Sonographer:    LJohny ChessRDCS Referring Phys: [id number] 1. Left ventricular ejection fraction, by estimation, is 50 to 55%. The left ventricle has low normal function. The left ventricle demonstrates regional wall motion abnormalities with basal to mid inferior hypokinesis. Left ventricular diastolic parameters were normal.  2. Right ventricular systolic function is normal. The right ventricular size is normal. Tricuspid regurgitation signal is inadequate for assessing PA pressure.  3. Left atrial size was mild to moderately dilated.  4. The mitral valve is normal in structure. No evidence of mitral valve regurgitation. No evidence of mitral stenosis.  5. The  aortic valve is tricuspid. Aortic valve regurgitation is not visualized. No aortic stenosis is present.  6. The inferior vena cava is normal in size with greater than 50% respiratory variability, suggesting right atrial pressure of 3 mmHg. FINDINGS  Left Ventricle: Left ventricular ejection fraction, by estimation, is 50 to 55%. The left ventricle has low normal function. The left ventricle demonstrates regional wall motion abnormalities. The left ventricular internal cavity size was normal in size. There is no left ventricular hypertrophy. Left ventricular diastolic parameters were normal. Right Ventricle: The right ventricular size is normal. No increase in right ventricular wall thickness. Right ventricular systolic function is normal. Tricuspid regurgitation signal  is inadequate for assessing PA pressure. Left Atrium: Left atrial size was mild to moderately dilated. Right Atrium: Right atrial size was normal in size. Pericardium: There is no evidence of pericardial effusion. Mitral Valve: The mitral valve is normal in structure. No evidence of mitral valve regurgitation. No evidence of mitral valve stenosis. Tricuspid Valve: The tricuspid valve is normal in structure. Tricuspid valve regurgitation is not demonstrated. Aortic Valve: The aortic valve is tricuspid. Aortic valve regurgitation is not visualized. No aortic stenosis is present. Pulmonic Valve: The pulmonic valve was normal in structure. Pulmonic valve regurgitation is not visualized. Aorta: The aortic root is normal in size and structure. Venous: The inferior vena cava is normal in size with greater than 50% respiratory variability, suggesting right atrial pressure of 3 mmHg. IAS/Shunts: No atrial level shunt detected by color flow Doppler.  LEFT VENTRICLE PLAX 2D LVIDd:         5.10 cm  Diastology LVIDs:         3.20 cm  LV e' medial:    8.92 cm/s LV PW:         0.90 cm  LV E/e' medial:  11.0 LV IVS:        0.80 cm  LV e' lateral:   11.20 cm/s LVOT diam:     2.10 cm  LV E/e' lateral: 8.8 LV SV:         68 LV SV Index:   39 LVOT Area:     3.46 cm  RIGHT VENTRICLE             IVC RV S prime:     15.40 cm/s  IVC diam: 1.90 cm TAPSE (M-mode): 2.4 cm LEFT ATRIUM             Index       RIGHT ATRIUM           Index LA diam:        3.80 cm 2.19 cm/m  RA Area:     11.90 cm LA Vol (A2C):   75.9 ml 43.65 ml/m RA Volume:   26.70 ml  15.36 ml/m LA Vol (A4C):   69.1 ml 39.74 ml/m LA Biplane Vol: 75.3 ml 43.31 ml/m  AORTIC VALVE LVOT Vmax:   101.00 cm/s LVOT Vmean:  61.800 cm/s LVOT VTI:    0.197 m  AORTA Ao Root diam: 3.00 cm Ao Asc diam:  3.30 cm MITRAL VALVE MV Area (PHT): 3.00 cm    SHUNTS MV Decel Time: 253 msec    Systemic VTI:  0.20 m MV E velocity: 98.50 cm/s  Systemic Diam: 2.10 cm MV A velocity: 62.90 cm/s MV  E/A ratio:  1.57 Dalton McleanMD Electronically signed by Franki Monte Signature Date/Time: 12/19/2020/5:32:35 PM    Final    (Echo, Carotid, EGD, Colonoscopy, ERCP)  Subjective: No complaints denies any chest pain or shortness of breath.  Discharge Exam: Vitals:   12/22/20 2251 12/23/20 0539  BP: (!) 143/72 (!) 154/79  Pulse: 60 60  Resp:  18  Temp: 98.7 F (37.1 C) 98.4 F (36.9 C)  SpO2: 98% 99%   Vitals:   12/22/20 1300 12/22/20 1400 12/22/20 2251 12/23/20 0539  BP: (!) 141/72  (!) 143/72 (!) 154/79  Pulse: 60 63 60 60  Resp: '19 19  18  '$ Temp: 98.6 F (37 C)  98.7 F (37.1 C) 98.4 F (36.9 C)  TempSrc: Oral  Oral Oral  SpO2: 98% 99% 98% 99%  Weight:    74.9 kg  Height:        General: Pt is alert, awake, not in acute distress Cardiovascular: RRR, S1/S2 +, no rubs, no gallops Respiratory: CTA bilaterally, no wheezing, no rhonchi Abdominal: Soft, NT, ND, bowel sounds + Extremities: no edema, no cyanosis    The results of significant diagnostics from this hospitalization (including imaging, microbiology, ancillary and laboratory) are listed below for reference.     Microbiology: Recent Results (from the past 240 hour(s))  SARS CORONAVIRUS 2 (TAT 6-24 HRS) Nasopharyngeal Nasopharyngeal Swab     Status: None   Collection Time: 12/19/20  6:37 PM   Specimen: Nasopharyngeal Swab  Result Value Ref Range Status   SARS Coronavirus 2 NEGATIVE NEGATIVE Final    Comment: (NOTE) SARS-CoV-2 target nucleic acids are NOT DETECTED.  The SARS-CoV-2 RNA is generally detectable in upper and lower respiratory specimens during the acute phase of infection. Negative results do not preclude SARS-CoV-2 infection, do not rule out co-infections with other pathogens, and should not be used as the sole basis for treatment or other patient management decisions. Negative results must be combined with clinical observations, patient history, and epidemiological information. The  expected result is Negative.  Fact Sheet for Patients: SugarRoll.be  Fact Sheet for Healthcare Providers: https://www.woods-mathews.com/  This test is not yet approved or cleared by the [place] FDA and  has been authorized for detection and/or diagnosis of SARS-CoV-2 by FDA under an Emergency Use Authorization (EUA). This EUA will remain  in effect (meaning this test can be used) for the duration of the COVID-19 declaration under Se ction 564(b)(1) of the Act, 21 U.S.C. section 360bbb-3(b)(1), unless the authorization is terminated or revoked sooner.  Performed at [facility], [place] [address], [place], [place] [address]      Labs: BNP (last 3 results) No results for input(s): BNP in the last 8760 hours. Basic Metabolic Panel: Recent Labs  Lab 12/19/20 1158 12/19/20 1648 12/21/20 0102 12/22/20 0210 12/23/20 0225  NA 138  --  136 138 136  K 3.8  --  4.1 4.1 3.4*  CL 111  --  106 108 106  CO2 20*  --  21* 22 21*  GLUCOSE 106*  --  110* 109* 120*  BUN 14  --  26* 20 17  CREATININE 0.71  --  0.91 0.84 0.69  CALCIUM 9.2  --  9.3 9.3 9.3  MG  --  2.0  --   --   --    Liver Function Tests: Recent Labs  Lab 12/19/20 1158 12/21/20 0102  AST 34 34  ALT 38 35  ALKPHOS 62 43  BILITOT 0.4 0.5  PROT 6.2* 5.9*  ALBUMIN 3.3* 3.3*   No results for input(s): LIPASE, AMYLASE in the last 168 hours. No results for input(s): AMMONIA in the  last 168 hours. CBC: Recent Labs  Lab 12/19/20 1158 12/21/20 0102 12/22/20 0210 12/23/20 0225  WBC 6.9 7.1 6.9 7.1  HGB 12.6* 11.8* 11.7* 10.9*  HCT 38.2* 33.9* 34.2* 31.9*  MCV 95.5 92.1 92.7 92.5  PLT 175 169 167 150   Cardiac Enzymes: No results for input(s): CKTOTAL, CKMB, CKMBINDEX, TROPONINI in the last 168 hours. BNP: Invalid input(s): POCBNP CBG: Recent Labs  Lab 12/21/20 2121  GLUCAP 137*   D-Dimer No results for input(s): DDIMER in the last 72 hours. Hgb A1c No  results for input(s): HGBA1C in the last 72 hours. Lipid Profile No results for input(s): CHOL, HDL, LDLCALC, TRIG, CHOLHDL, LDLDIRECT in the last 72 hours. Thyroid function studies No results for input(s): TSH, T4TOTAL, T3FREE, THYROIDAB in the last 72 hours.  Invalid input(s): FREET3 Anemia work up No results for input(s): VITAMINB12, FOLATE, FERRITIN, TIBC, IRON, RETICCTPCT in the last 72 hours. Urinalysis    Component Value Date/Time   COLORURINE YELLOW 12/10/2020 1001   APPEARANCEUR CLEAR 12/10/2020 1001   LABSPEC 1.019 12/10/2020 1001   PHURINE 5.0 12/10/2020 1001   GLUCOSEU NEGATIVE 12/10/2020 1001   HGBUR NEGATIVE 12/10/2020 1001   BILIRUBINUR NEGATIVE 12/10/2020 1001   KETONESUR NEGATIVE 12/10/2020 1001   PROTEINUR NEGATIVE 12/10/2020 1001   NITRITE NEGATIVE 12/10/2020 1001   LEUKOCYTESUR NEGATIVE 12/10/2020 1001   Sepsis Labs Invalid input(s): PROCALCITONIN,  WBC,  LACTICIDVEN Microbiology Recent Results (from the past 240 hour(s))  SARS CORONAVIRUS 2 (TAT 6-24 HRS) Nasopharyngeal Nasopharyngeal Swab     Status: None   Collection Time: 12/19/20  6:37 PM   Specimen: Nasopharyngeal Swab  Result Value Ref Range Status   SARS Coronavirus 2 NEGATIVE NEGATIVE Final    Comment: (NOTE) SARS-CoV-2 target nucleic acids are NOT DETECTED.  The SARS-CoV-2 RNA is generally detectable in upper and lower respiratory specimens during the acute phase of infection. Negative results do not preclude SARS-CoV-2 infection, do not rule out co-infections with other pathogens, and should not be used as the sole basis for treatment or other patient management decisions. Negative results must be combined with clinical observations, patient history, and epidemiological information. The expected result is Negative.  Fact Sheet for Patients: SugarRoll.be  Fact Sheet for Healthcare Providers: https://www.woods-mathews.com/  This test is not yet  approved or cleared by the [place] FDA and  has been authorized for detection and/or diagnosis of SARS-CoV-2 by FDA under an Emergency Use Authorization (EUA). This EUA will remain  in effect (meaning this test can be used) for the duration of the COVID-19 declaration under Se ction 564(b)(1) of the Act, 21 U.S.C. section 360bbb-3(b)(1), unless the authorization is terminated or revoked sooner.  Performed at [facility], [place] [address], [place], [place] [address]      Time coordinating discharge: Over 30 minutes  SIGNED:   Charlynne Cousins, MD  [facility] 12/23/2020, 9:44 AM Pager   If 7PM-7AM, please contact night-coverage [email or web address] Password [email or web address]

## 2020-12-27 ENCOUNTER — Encounter: Payer: Self-pay | Admitting: *Deleted

## 2020-12-27 ENCOUNTER — Other Ambulatory Visit: Payer: Self-pay | Admitting: Oncology

## 2020-12-27 ENCOUNTER — Other Ambulatory Visit (HOSPITAL_COMMUNITY): Payer: Self-pay

## 2020-12-27 MED ORDER — ABIRATERONE ACETATE 250 MG PO TABS
ORAL_TABLET | Freq: Every day | ORAL | 0 refills | Status: DC
  Filled 2020-12-27: qty 120, 30d supply, fill #0

## 2020-12-27 NOTE — Progress Notes (Signed)
Per Dr. Benay Spice: Schedule for OV 9/22 or 9/23 (after tumor board discussion on 9/21). Scheduling message sent.

## 2020-12-29 ENCOUNTER — Ambulatory Visit
Admission: RE | Admit: 2020-12-29 | Discharge: 2020-12-29 | Disposition: A | Discharge status: Home / Self Care | Payer: BC Managed Care – PPO | Source: Ambulatory Visit | Attending: Oncology | Admitting: Oncology

## 2020-12-29 ENCOUNTER — Other Ambulatory Visit: Payer: Self-pay

## 2020-12-29 DIAGNOSIS — R160 Hepatomegaly, not elsewhere classified: Secondary | ICD-10-CM

## 2020-12-29 NOTE — Consult Note (Signed)
IR tele consult was arranged for today at 3:00, however despite multiple attempted phone calls this afternoon, the patient could not be reached.  We will attempt to re-schedule.  Ronny Bacon, MD Pager #: [phone]

## 2021-01-03 ENCOUNTER — Inpatient Hospital Stay: Payer: BC Managed Care – PPO | Admitting: Oncology

## 2021-01-03 ENCOUNTER — Inpatient Hospital Stay: Payer: BC Managed Care – PPO

## 2021-01-04 ENCOUNTER — Other Ambulatory Visit: Payer: Self-pay

## 2021-01-04 NOTE — Progress Notes (Signed)
CARDIOLOGY CONSULT NOTE       Patient ID: Edgar Mooney MRN: [id number] DOB/AGE: 1965-06-22 55 y.o.  Admit date: (Not on file) Referring Physician: Marijean Bravo [facility] Primary Physician: Patient, No Pcp Per (Inactive) Primary Cardiologist: Johnsie Cancel Reason for Consultation: CAD  Active Problems:   * No active hospital problems. *   HPI:  55 y.o. first seen for chest pain in ER 12/19/20 History of metastatic prostate cancer On baseline narcotics for bone mets including epidural ventral mets in sacram and C spine. Also ? New liver malignancy Cath done 12/21/20 99% proximal LAD that was stented Came back the following day for stenting of the proximal and mid RCA TTE showed EF 50-55%  Long discussion with him about med compliance Has not been taking ASA/Brilinta regularly And is at risk for stent thrombosis No chest pain Still drinking beer daily   ROS All other systems reviewed and negative except as noted above  Past Medical History:  Diagnosis Date   Alcohol abuse    Cocaine abuse ([place])    Depression    Gout    Hep C w/o coma, chronic ([place]) diagnosed May 2016    Family History  Problem Relation Age of Onset   Cancer Mother    Cancer Father     Social History   Socioeconomic History   Marital status: Single    Spouse name: Not on file   Number of children: Not on file   Years of education: Not on file   Highest education level: Not on file  Occupational History   Not on file  Tobacco Use   Smoking status: Every Day    Packs/day: 1.00    Years: 25.00    Pack years: 25.00    Types: Cigarettes   Smokeless tobacco: Never  Substance and Sexual Activity   Alcohol use: Not Currently    Alcohol/week: 126.0 standard drinks    Types: 126 Cans of beer per week    Comment: 18 pack per day   Drug use: Yes    Types: Cocaine, IV, Heroin, Marijuana    Comment: Used crack and cocaine 04/02/16   Sexual activity: Not Currently  Other Topics Concern   Not on file  Social History Narrative    Not on file   Social Determinants of Health   Financial Resource Strain: High Risk   Difficulty of Paying Living Expenses: Hard  Food Insecurity: Food Insecurity Present   Worried About Running Out of Food in the Last Year: Sometimes true   Ran Out of Food in the Last Year: Never true  Transportation Needs: No Transportation Needs   Lack of Transportation (Medical): No   Lack of Transportation (Non-Medical): No  Physical Activity: Not on file  Stress: Not on file  Social Connections: Moderately Isolated   Frequency of Communication with Friends and Family: Once a week   Frequency of Social Gatherings with Friends and Family: Once a week   Attends Religious Services: 1 to 4 times per year   Active Member of Clubs or Organizations: No   Attends Music therapist: 1 to 4 times per year   Marital Status: Separated  Intimate Partner Violence: Not At Risk   Fear of Current or Ex-Partner: No   Emotionally Abused: No   Physically Abused: No   Sexually Abused: No    Past Surgical History:  Procedure Laterality Date   CORONARY STENT INTERVENTION N/A 12/21/2020   Procedure: CORONARY STENT INTERVENTION;  Surgeon: Shelva Majestic  A, MD;  Location: [facility];  Service: Cardiovascular;  Laterality: N/A;   CORONARY STENT INTERVENTION N/A 12/22/2020   Procedure: CORONARY STENT INTERVENTION;  Surgeon: Burnell Blanks, MD;  Location: [facility];  Service: Cardiovascular;  Laterality: N/A;   LEFT HEART CATH AND CORONARY ANGIOGRAPHY N/A 12/21/2020   Procedure: LEFT HEART CATH AND CORONARY ANGIOGRAPHY;  Surgeon: Troy Sine, MD;  Location: [facility];  Service: Cardiovascular;  Laterality: N/A;      Current Outpatient Medications:    abiraterone acetate (ZYTIGA) 250 MG tablet, TAKE 4 TABLETS (1,000 MG TOTAL) BY MOUTH DAILY. TAKE ON AN EMPTY STOMACH 1 HOUR BEFORE OR 2 HOURS AFTER A MEAL, Disp: 120 tablet, Rfl: 0   allopurinol (ZYLOPRIM) 100 MG tablet, TAKE  1 TABLET BY MOUTH EVERY DAY START ON 12/24, Disp: 90 tablet, Rfl: 1   amLODipine (NORVASC) 5 MG tablet, Take 1 tablet (5 mg total) by mouth daily., Disp: 90 tablet, Rfl: 3   aspirin 81 MG chewable tablet, Chew 1 tablet (81 mg total) by mouth daily., Disp: 90 tablet, Rfl: 3   ferrous gluconate (FERGON) 324 MG tablet, Take 1 tablet (324 mg total) by mouth 2 (two) times daily with a meal., Disp: 30 tablet, Rfl: 3   indomethacin (INDOCIN) 50 MG capsule, Take 1 capsule (50 mg total) by mouth 3 (three) times daily with meals. For gout flare, Disp: 15 capsule, Rfl: 0   nitroGLYCERIN (NITROSTAT) 0.4 MG SL tablet, Place 1 tablet (0.4 mg total) under the tongue every 5 (five) minutes as needed for chest pain., Disp: 25 tablet, Rfl: 12   ondansetron (ZOFRAN) 4 MG tablet, Take 1 tablet (4 mg total) by mouth every 6 (six) hours as needed for nausea., Disp: 20 tablet, Rfl: 0   oxyCODONE-acetaminophen (PERCOCET/ROXICET) 5-325 MG tablet, Take 1 tablet by mouth every 4 (four) hours as needed for severe pain., Disp: 60 tablet, Rfl: 0   predniSONE (DELTASONE) 5 MG tablet, TAKE 1 TABLET (5 MG TOTAL) BY MOUTH DAILY WITH BREAKFAST. (Patient taking differently: Take 5 mg by mouth daily with breakfast.), Disp: 30 tablet, Rfl: 5   rosuvastatin (CRESTOR) 20 MG tablet, Take 1 tablet (20 mg total) by mouth daily., Disp: 90 tablet, Rfl: 0   tamsulosin (FLOMAX) 0.4 MG CAPS capsule, TAKE 1 CAPSULE BY MOUTH EVERY DAY AFTER SUPPER (Patient taking differently: Take 0.4 mg by mouth daily.), Disp: 90 capsule, Rfl: 1   ticagrelor (BRILINTA) 90 MG TABS tablet, Take 1 tablet (90 mg total) by mouth 2 (two) times daily., Disp: 120 tablet, Rfl: 3    Physical Exam: Blood pressure (!) 120/54, pulse 79, height 5\' 2"  (1.575 m), weight 73.5 kg, SpO2 97 %.    Affect appropriate Chronically ill male  HEENT: normal Neck supple with no adenopathy JVP normal no bruits no thyromegaly Lungs clear with no wheezing and good diaphragmatic  motion Heart:  S1/S2 no murmur, no rub, gallop or click PMI normal Abdomen: benighn, BS positve, no tenderness, no AAA no bruit.  No HSM or HJR Distal pulses intact with no bruits No edema Neuro non-focal Skin warm and dry No muscular weakness   Labs:   Lab Results  Component Value Date   WBC 7.1 12/23/2020   HGB 10.9 (L) 12/23/2020   HCT 31.9 (L) 12/23/2020   MCV 92.5 12/23/2020   PLT 150 12/23/2020   No results for input(s): NA, K, CL, CO2, BUN, CREATININE, CALCIUM, PROT, BILITOT, ALKPHOS, ALT, AST, GLUCOSE  in the last 168 hours.  Invalid input(s): LABALBU No results found for: CKTOTAL, CKMB, CKMBINDEX, TROPONINI  Lab Results  Component Value Date   CHOL 201 (H) 12/19/2020   CHOL 196 12/19/2017   Lab Results  Component Value Date   HDL 36 (L) 12/19/2020   HDL 29 (L) 12/19/2017   Lab Results  Component Value Date   LDLCALC 115 (H) 12/19/2020   LDLCALC Comment 12/19/2017   Lab Results  Component Value Date   TRIG 248 (H) 12/19/2020   TRIG 251 (H) 04/01/2020   TRIG 409 (H) 12/19/2017   Lab Results  Component Value Date   CHOLHDL 5.6 12/19/2020   CHOLHDL 6.8 (H) 12/19/2017   Lab Results  Component Value Date   LDLDIRECT 94 02/27/2018      Radiology: DG Chest 2 View  Result Date: 12/19/2020 CLINICAL DATA:  Chest pain, diaphoresis EXAM: CHEST - 2 VIEW COMPARISON:  03/03/2020 FINDINGS: Lower lung volumes with similar diffuse bilateral interstitial opacities may represent chronic lung disease. Minor basilar atelectasis. Normal heart size and vascularity. No definite focal pneumonia, collapse or consolidation. Negative for effusion or pneumothorax. Trachea midline. Degenerative changes of the spine. IMPRESSION: Similar chronic interstitial lung changes with lower lung volumes. No definite superimposed acute process. Electronically Signed   By: [name].  Shick M.D.   On: 12/19/2020 12:45   MR Cervical Spine W or Wo Contrast  Result Date: 12/20/2020 CLINICAL DATA:   Initial evaluation for intermittent left-sided chest pain, history of metastatic prostate cancer. EXAM: MRI CERVICAL, THORACIC AND LUMBAR SPINE WITHOUT AND WITH CONTRAST TECHNIQUE: Multiplanar and multiecho pulse sequences of the cervical spine, to include the craniocervical junction and cervicothoracic junction, and thoracic and lumbar spine, were obtained without and with intravenous contrast. CONTRAST:  43mL GADAVIST GADOBUTROL 1 MMOL/ML IV SOLN COMPARISON:  Prior MRIs from 03/04/2020. FINDINGS: MRI CERVICAL SPINE FINDINGS Alignment: Mild straightening of the normal cervical lordosis. No listhesis. Vertebrae: Diffuse infiltrative low marrow signal intensity again seen throughout the visualized skull base and spine. Previously seen superimposed enhancing lesions are markedly improved as compared to previous exam, compatible with interval response to therapy. Appearance is most evident at the posterior arch of C2 where only a small focus of enhancement is now seen on today's exam, previously fairly prominent on prior MRI. No definite new or progressive lesions. Vertebral body height maintained without pathologic or interval fracture. Previously question extra osseous tumor on the right at C5-6 is not convincingly seen on today's exam. No other new extra osseous extension. No epidural or intracanalicular involvement identified. Cord: Normal signal and morphology. No abnormal enhancement or intracanalicular involvement of tumor. Posterior Fossa, vertebral arteries, paraspinal tissues: Empty sella partially visualized. Visualized portions of the brain and posterior fossa otherwise unremarkable. Paraspinous and prevertebral soft tissues within normal limits. Normal flow voids seen within the vertebral arteries bilaterally. Disc levels: C2-C3: Negative interspace. Mild left-sided facet hypertrophy. No stenosis. C3-C4: Broad posterior disc osteophyte flattens the ventral thecal sac with resultant mild-to-moderate spinal  stenosis. Superimposed uncovertebral and facet hypertrophy with resultant moderate left with mild right C4 foraminal stenosis. C4-C5: Mild disc bulge with uncovertebral spurring. Bilateral facet hypertrophy. No significant spinal stenosis. Moderate right with mild left C5 foraminal narrowing. C5-C6: Degenerative intervertebral disc space narrowing with diffuse disc osteophyte complex. Flattening and partial effacement of the ventral thecal sac, eccentric to the right. Mild spinal stenosis. Severe right worse than left C6 foraminal narrowing. C6-C7: Disc bulge with uncovertebral spurring. Superimposed left paracentral disc protrusion indents  the left ventral thecal sac. Mild spinal stenosis. Mild to moderate left with mild right C7 foraminal narrowing. C7-T1:  Normal interspace.  No canal or foraminal stenosis. MRI THORACIC SPINE FINDINGS Alignment: Physiologic with preservation of the normal thoracic kyphosis. No listhesis. Vertebrae: Diffusely abnormal appearance of the visualized osseous structures, consistent with widespread osseous metastatic disease. Overall appearance is markedly improved as compared to previous exam, with decreased heterogeneous enhancement and STIR signal abnormality seen throughout the thoracic spine. Previously seen trace epidural tumor posterior to the T12 vertebral body is no longer visualized. No new extra osseous or epidural tumor identified. Vertebral body height maintained without pathologic or interval fracture. Cord: Normal signal and morphology. No epidural or intracanalicular tumor. No abnormal enhancement. Paraspinal and other soft tissues: Paraspinous soft tissues within normal limits. Splenomegaly partially visualized. Disc levels: No new significant disc pathology. No significant stenosis or neural impingement. MRI LUMBAR SPINE FINDINGS Segmentation: Standard. Lowest well-formed disc space labeled the L5-S1 level. Alignment: Physiologic with preservation of the normal lumbar  lordosis. No listhesis. Vertebrae: Widespread osseous metastatic disease again seen throughout the visualized lumbar spine and pelvis. Overall, appearance is markedly improved with decreased heterogeneous enhancement seen throughout the affected structures as compared to prior. Vertebral body height maintained without interval or pathologic fracture. Previously seen epidural tumor posterior to the S1 vertebral body has largely resolved, and is no longer seen. Similarly, previously seen extraosseous extension from the bilateral ilium is also no longer clearly visualized. No significant extraosseous extension now seen. No significant epidural or intracanalicular involvement. Conus medullaris and cauda equina: Conus extends to the T12 level. Conus and cauda equina appear normal. Paraspinal and other soft tissues: Interval resolution in previously seen bulky retroperitoneal adenopathy. Disc levels: L1-2:  Unremarkable. L2-3:  Negative interspace.  Mild facet hypertrophy.  No stenosis. L3-4: Disc bulge with mild facet hypertrophy. No spinal stenosis. Mild left L3 foraminal narrowing. L4-5: Degenerative intervertebral disc space narrowing with diffuse disc bulge and reactive endplate spurring. Mild to moderate facet hypertrophy. Resultant mild canal with moderate bilateral lateral recess stenosis. Moderate right with mild left L4 foraminal narrowing. L5-S1: Degenerative intervertebral disc space narrowing with diffuse disc bulge and reactive endplate spurring. Superimposed left subarticular disc protrusion contacts and mildly displaces the descending left S1 nerve root. Mild-to-moderate facet hypertrophy. Resultant moderate left with mild right lateral recess stenosis. Mild bilateral L5 foraminal narrowing. IMPRESSION: MRI CERVICAL SPINE IMPRESSION: 1. Interval improvement in appearance of widespread osseous metastatic disease, consistent with interval response to therapy. No pathologic fracture. Previously seen  extraosseous/epidural tumor at C5-6 is no longer clearly visualized, with no extraosseous or epidural tumor now seen. 2. Underlying multilevel cervical spondylosis with resultant mild-to-moderate spinal stenosis at C3-4 through C6-7 as above. Moderate left C4 and right C5 foraminal stenosis, with severe bilateral C6 foraminal narrowing. MRI THORACIC SPINE IMPRESSION: Marked interval improvement in appearance of widespread osseous metastatic disease, consistent with interval response to therapy. No pathologic fracture. No extra osseous or epidural tumor identified. MRI LUMBAR SPINE IMPRESSION: 1. Interval improvement in widespread osseous metastatic disease involving the lumbar spine and visualized pelvis. No pathologic fracture. 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 No significant epidural or extraosseous disease now seen. 2. Interval improvement/resolution in previously seen retroperitoneal adenopathy. 3. Left subarticular disc protrusion at L5-S1, potentially affecting the left S1 nerve root. Additional more mild degenerative spondylosis elsewhere within the lumbar spine as above. Electronically  Signed   By: Jeannine Boga M.D.   On: 12/20/2020 05:37   MR THORACIC SPINE W WO CONTRAST  Result Date: 12/20/2020 CLINICAL DATA:  Initial evaluation for intermittent left-sided chest pain, history of metastatic prostate cancer. EXAM: MRI CERVICAL, THORACIC AND LUMBAR SPINE WITHOUT AND WITH CONTRAST TECHNIQUE: Multiplanar and multiecho pulse sequences of the cervical spine, to include the craniocervical junction and cervicothoracic junction, and thoracic and lumbar spine, were obtained without and with intravenous contrast. CONTRAST:  1mL GADAVIST GADOBUTROL 1 MMOL/ML IV SOLN COMPARISON:  Prior MRIs from 03/04/2020. FINDINGS: MRI CERVICAL SPINE FINDINGS Alignment: Mild straightening of the normal cervical  lordosis. No listhesis. Vertebrae: Diffuse infiltrative low marrow signal intensity again seen throughout the visualized skull base and spine. Previously seen superimposed enhancing lesions are markedly improved as compared to previous exam, compatible with interval response to therapy. Appearance is most evident at the posterior arch of C2 where only a small focus of enhancement is now seen on today's exam, previously fairly prominent on prior MRI. No definite new or progressive lesions. Vertebral body height maintained without pathologic or interval fracture. Previously question extra osseous tumor on the right at C5-6 is not convincingly seen on today's exam. No other new extra osseous extension. No epidural or intracanalicular involvement identified. Cord: Normal signal and morphology. No abnormal enhancement or intracanalicular involvement of tumor. Posterior Fossa, vertebral arteries, paraspinal tissues: Empty sella partially visualized. Visualized portions of the brain and posterior fossa otherwise unremarkable. Paraspinous and prevertebral soft tissues within normal limits. Normal flow voids seen within the vertebral arteries bilaterally. Disc levels: C2-C3: Negative interspace. Mild left-sided facet hypertrophy. No stenosis. C3-C4: Broad posterior disc osteophyte flattens the ventral thecal sac with resultant mild-to-moderate spinal stenosis. Superimposed uncovertebral and facet hypertrophy with resultant moderate left with mild right C4 foraminal stenosis. C4-C5: Mild disc bulge with uncovertebral spurring. Bilateral facet hypertrophy. No significant spinal stenosis. Moderate right with mild left C5 foraminal narrowing. C5-C6: Degenerative intervertebral disc space narrowing with diffuse disc osteophyte complex. Flattening and partial effacement of the ventral thecal sac, eccentric to the right. Mild spinal stenosis. Severe right worse than left C6 foraminal narrowing. C6-C7: Disc bulge with uncovertebral  spurring. Superimposed left paracentral disc protrusion indents the left ventral thecal sac. Mild spinal stenosis. Mild to moderate left with mild right C7 foraminal narrowing. C7-T1:  Normal interspace.  No canal or foraminal stenosis. MRI THORACIC SPINE FINDINGS Alignment: Physiologic with preservation of the normal thoracic kyphosis. No listhesis. Vertebrae: Diffusely abnormal appearance of the visualized osseous structures, consistent with widespread osseous metastatic disease. Overall appearance is markedly improved as compared to previous exam, with decreased heterogeneous enhancement and STIR signal abnormality seen throughout the thoracic spine. Previously seen trace epidural tumor posterior to the T12 vertebral body is no longer visualized. No new extra osseous or epidural tumor identified. Vertebral body height maintained without pathologic or interval fracture. Cord: Normal signal and morphology. No epidural or intracanalicular tumor. No abnormal enhancement. Paraspinal and other soft tissues: Paraspinous soft tissues within normal limits. Splenomegaly partially visualized. Disc levels: No new significant disc pathology. No significant stenosis or neural impingement. MRI LUMBAR SPINE FINDINGS Segmentation: Standard. Lowest well-formed disc space labeled the L5-S1 level. Alignment: Physiologic with preservation of the normal lumbar lordosis. No listhesis. Vertebrae: Widespread osseous metastatic disease again seen throughout the visualized lumbar spine and pelvis. Overall, appearance is markedly improved with decreased heterogeneous enhancement seen throughout the affected structures as compared to prior. Vertebral body height maintained without interval or pathologic fracture.  Previously seen epidural tumor posterior to the S1 vertebral body has largely resolved, and is no longer seen. Similarly, previously seen extraosseous extension from the bilateral ilium is also no longer clearly visualized. No  significant extraosseous extension now seen. No significant epidural or intracanalicular involvement. Conus medullaris and cauda equina: Conus extends to the T12 level. Conus and cauda equina appear normal. Paraspinal and other soft tissues: Interval resolution in previously seen bulky retroperitoneal adenopathy. Disc levels: L1-2:  Unremarkable. L2-3:  Negative interspace.  Mild facet hypertrophy.  No stenosis. L3-4: Disc bulge with mild facet hypertrophy. No spinal stenosis. Mild left L3 foraminal narrowing. L4-5: Degenerative intervertebral disc space narrowing with diffuse disc bulge and reactive endplate spurring. Mild to moderate facet hypertrophy. Resultant mild canal with moderate bilateral lateral recess stenosis. Moderate right with mild left L4 foraminal narrowing. L5-S1: Degenerative intervertebral disc space narrowing with diffuse disc bulge and reactive endplate spurring. Superimposed left subarticular disc protrusion contacts and mildly displaces the descending left S1 nerve root. Mild-to-moderate facet hypertrophy. Resultant moderate left with mild right lateral recess stenosis. Mild bilateral L5 foraminal narrowing. IMPRESSION: MRI CERVICAL SPINE IMPRESSION: 1. Interval improvement in appearance of widespread osseous metastatic disease, consistent with interval response to therapy. No pathologic fracture. Previously seen extraosseous/epidural tumor at C5-6 is no longer clearly visualized, with no extraosseous or epidural tumor now seen. 2. Underlying multilevel cervical spondylosis with resultant mild-to-moderate spinal stenosis at C3-4 through C6-7 as above. Moderate left C4 and right C5 foraminal stenosis, with severe bilateral C6 foraminal narrowing. MRI THORACIC SPINE IMPRESSION: Marked interval improvement in appearance of widespread osseous metastatic disease, consistent with interval response to therapy. No pathologic fracture. No extra osseous or epidural tumor identified. MRI LUMBAR SPINE  IMPRESSION: 1. Interval improvement in widespread osseous metastatic disease involving the lumbar spine and visualized pelvis. No pathologic fracture. 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 No significant epidural or extraosseous disease now seen. 2. Interval improvement/resolution in previously seen retroperitoneal adenopathy. 3. Left subarticular disc protrusion at L5-S1, potentially affecting the left S1 nerve root. Additional more mild degenerative spondylosis elsewhere within the lumbar spine as above. Electronically Signed   By: Jeannine Boga M.D.   On: 12/20/2020 05:37   MR Lumbar Spine W Wo Contrast  Result Date: 12/20/2020 CLINICAL DATA:  Initial evaluation for intermittent left-sided chest pain, history of metastatic prostate cancer. EXAM: MRI CERVICAL, THORACIC AND LUMBAR SPINE WITHOUT AND WITH CONTRAST TECHNIQUE: Multiplanar and multiecho pulse sequences of the cervical spine, to include the craniocervical junction and cervicothoracic junction, and thoracic and lumbar spine, were obtained without and with intravenous contrast. CONTRAST:  48mL GADAVIST GADOBUTROL 1 MMOL/ML IV SOLN COMPARISON:  Prior MRIs from 03/04/2020. FINDINGS: MRI CERVICAL SPINE FINDINGS Alignment: Mild straightening of the normal cervical lordosis. No listhesis. Vertebrae: Diffuse infiltrative low marrow signal intensity again seen throughout the visualized skull base and spine. Previously seen superimposed enhancing lesions are markedly improved as compared to previous exam, compatible with interval response to therapy. Appearance is most evident at the posterior arch of C2 where only a small focus of enhancement is now seen on today's exam, previously fairly prominent on prior MRI. No definite new or progressive lesions. Vertebral body height maintained without pathologic or interval fracture. Previously question extra  osseous tumor on the right at C5-6 is not convincingly seen on today's exam. No other new extra osseous extension. No epidural or intracanalicular involvement  identified. Cord: Normal signal and morphology. No abnormal enhancement or intracanalicular involvement of tumor. Posterior Fossa, vertebral arteries, paraspinal tissues: Empty sella partially visualized. Visualized portions of the brain and posterior fossa otherwise unremarkable. Paraspinous and prevertebral soft tissues within normal limits. Normal flow voids seen within the vertebral arteries bilaterally. Disc levels: C2-C3: Negative interspace. Mild left-sided facet hypertrophy. No stenosis. C3-C4: Broad posterior disc osteophyte flattens the ventral thecal sac with resultant mild-to-moderate spinal stenosis. Superimposed uncovertebral and facet hypertrophy with resultant moderate left with mild right C4 foraminal stenosis. C4-C5: Mild disc bulge with uncovertebral spurring. Bilateral facet hypertrophy. No significant spinal stenosis. Moderate right with mild left C5 foraminal narrowing. C5-C6: Degenerative intervertebral disc space narrowing with diffuse disc osteophyte complex. Flattening and partial effacement of the ventral thecal sac, eccentric to the right. Mild spinal stenosis. Severe right worse than left C6 foraminal narrowing. C6-C7: Disc bulge with uncovertebral spurring. Superimposed left paracentral disc protrusion indents the left ventral thecal sac. Mild spinal stenosis. Mild to moderate left with mild right C7 foraminal narrowing. C7-T1:  Normal interspace.  No canal or foraminal stenosis. MRI THORACIC SPINE FINDINGS Alignment: Physiologic with preservation of the normal thoracic kyphosis. No listhesis. Vertebrae: Diffusely abnormal appearance of the visualized osseous structures, consistent with widespread osseous metastatic disease. Overall appearance is markedly improved as compared to previous exam, with decreased heterogeneous  enhancement and STIR signal abnormality seen throughout the thoracic spine. Previously seen trace epidural tumor posterior to the T12 vertebral body is no longer visualized. No new extra osseous or epidural tumor identified. Vertebral body height maintained without pathologic or interval fracture. Cord: Normal signal and morphology. No epidural or intracanalicular tumor. No abnormal enhancement. Paraspinal and other soft tissues: Paraspinous soft tissues within normal limits. Splenomegaly partially visualized. Disc levels: No new significant disc pathology. No significant stenosis or neural impingement. MRI LUMBAR SPINE FINDINGS Segmentation: Standard. Lowest well-formed disc space labeled the L5-S1 level. Alignment: Physiologic with preservation of the normal lumbar lordosis. No listhesis. Vertebrae: Widespread osseous metastatic disease again seen throughout the visualized lumbar spine and pelvis. Overall, appearance is markedly improved with decreased heterogeneous enhancement seen throughout the affected structures as compared to prior. Vertebral body height maintained without interval or pathologic fracture. Previously seen epidural tumor posterior to the S1 vertebral body has largely resolved, and is no longer seen. Similarly, previously seen extraosseous extension from the bilateral ilium is also no longer clearly visualized. No significant extraosseous extension now seen. No significant epidural or intracanalicular involvement. Conus medullaris and cauda equina: Conus extends to the T12 level. Conus and cauda equina appear normal. Paraspinal and other soft tissues: Interval resolution in previously seen bulky retroperitoneal adenopathy. Disc levels: L1-2:  Unremarkable. L2-3:  Negative interspace.  Mild facet hypertrophy.  No stenosis. L3-4: Disc bulge with mild facet hypertrophy. No spinal stenosis. Mild left L3 foraminal narrowing. L4-5: Degenerative intervertebral disc space narrowing with diffuse disc  bulge and reactive endplate spurring. Mild to moderate facet hypertrophy. Resultant mild canal with moderate bilateral lateral recess stenosis. Moderate right with mild left L4 foraminal narrowing. L5-S1: Degenerative intervertebral disc space narrowing with diffuse disc bulge and reactive endplate spurring. Superimposed left subarticular disc protrusion contacts and mildly displaces the descending left S1 nerve root. Mild-to-moderate facet hypertrophy. Resultant moderate left with mild right lateral recess stenosis. Mild bilateral L5 foraminal narrowing. IMPRESSION: MRI CERVICAL SPINE IMPRESSION: 1. Interval improvement in appearance of widespread osseous metastatic disease, consistent with interval response to therapy. No pathologic fracture. Previously seen extraosseous/epidural tumor at C5-6  is no longer clearly visualized, with no extraosseous or epidural tumor now seen. 2. Underlying multilevel cervical spondylosis with resultant mild-to-moderate spinal stenosis at C3-4 through C6-7 as above. Moderate left C4 and right C5 foraminal stenosis, with severe bilateral C6 foraminal narrowing. MRI THORACIC SPINE IMPRESSION: Marked interval improvement in appearance of widespread osseous metastatic disease, consistent with interval response to therapy. No pathologic fracture. No extra osseous or epidural tumor identified. MRI LUMBAR SPINE IMPRESSION: 1. Interval improvement in widespread osseous metastatic disease involving the lumbar spine and visualized pelvis. No pathologic fracture. 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 No significant epidural or extraosseous disease now seen. 2. Interval improvement/resolution in previously seen retroperitoneal adenopathy. 3. Left subarticular disc protrusion at L5-S1, potentially affecting the left S1 nerve root. Additional more mild degenerative spondylosis elsewhere  within the lumbar spine as above. Electronically Signed   By: Jeannine Boga M.D.   On: 12/20/2020 05:37   CT ABDOMEN PELVIS W CONTRAST  Addendum Date: 12/10/2020   ADDENDUM REPORT: 12/10/2020 14:58 ADDENDUM: Of note, the hepatic mass could be further evaluated with a multiphase MR abdomen without and with contrast. Electronically Signed   By: Zerita Boers M.D.   On: 12/10/2020 14:58   Result Date: 12/10/2020 CLINICAL DATA:  Right lower quadrant pain. The patient has a history of hepatitis C cirrhosis and prostate cancer. EXAM: CT ABDOMEN AND PELVIS WITH CONTRAST TECHNIQUE: Multidetector CT imaging of the abdomen and pelvis was performed using the standard protocol following bolus administration of intravenous contrast. CONTRAST:  30mL OMNIPAQUE IOHEXOL 350 MG/ML SOLN COMPARISON:  CT abdomen pelvis dated 03/03/2020. FINDINGS: Lower chest: Mild bilateral dependent atelectasis. Hepatobiliary: An enhancing mass in the right hepatic lobe measures 9.1 x 9.2 x 9.2 cm and appears to demonstrate washout enhancement on delayed phase imaging. This was not seen on prior exam. No gallstones, gallbladder wall thickening, or biliary dilatation. Pancreas: Calcifications in the head of the pancreas are unchanged and likely reflect chronic pancreatitis. No pancreatic ductal dilatation or surrounding inflammatory changes. Spleen: Normal in size without focal abnormality. Adrenals/Urinary Tract: Adrenal glands are unremarkable. Kidneys are normal, without renal calculi, focal lesion, or hydronephrosis. The urinary bladder wall appears thickened for the degree of distension. Stomach/Bowel: Stomach is within normal limits. Appendix appears normal. No evidence of bowel wall thickening, distention, or inflammatory changes. Vascular/Lymphatic: Aortic atherosclerosis. A lymph node to the left of the inferior vena cava near the porta hepatis measures 1.2 cm in short axis (series 3, image 24 and may be slightly enlarged compared  to 03/03/2020. No enlarged pelvic lymph nodes. Reproductive: Prostate is unremarkable. Other: There is a small amount of peritoneal fluid near the hepatic mass extending along the right pericolic gutter. No abdominal wall hernia or abnormality. Musculoskeletal: Numerous sclerotic lesions throughout the spine and pelvis appear progressed since prior exam. Degenerative changes are seen in the spine. Previously seen metastatic disease involving the ventral epidural space at the level of S1 is not appreciated on today's exam. IMPRESSION: 1. Enhancing mass in the right hepatic lobe is concerning for hepatocellular carcinoma given the apparent washout on delayed phase and reported history of hepatitis C cirrhosis. Metastatic disease from the patient's reported prostate cancer is considered less likely. 2. Urinary bladder wall thickening for the degree of distension may reflect cystitis or chronic bladder outlet obstruction. 3. Numerous sclerotic lesions throughout the spine and pelvis appear progressed since 03/03/2020, likely  representing metastatic disease. Electronically Signed: By: Zerita Boers M.D. On: 12/10/2020 14:53   CARDIAC CATHETERIZATION  Result Date: 12/22/2020   Ost RCA to Prox RCA lesion is 80% stenosed.   Prox RCA lesion is 70% stenosed.   Mid RCA lesion is 40% stenosed.   A drug-eluting stent was successfully placed using a STENT ONYX FRONTIER 3.0X38.   Post intervention, there is a 0% residual stenosis.   Post intervention, there is a 0% residual stenosis.   Post intervention, there is a 0% residual stenosis. Severe stenosis proximal to mid RCA Successful PTCA/DES x 1 proximal to mid RCA Continue DAPT with ASA and Brilinta for 12 months. Continue statin and beta blocker.   CARDIAC CATHETERIZATION  Result Date: 12/21/2020   1st Mrg lesion is 40% stenosed.   2nd Mrg lesion is 60% stenosed.   Mid Cx to Dist Cx lesion is 30% stenosed.   Ramus-1 lesion is 50% stenosed.   Ramus-2 lesion is 80% stenosed.    2nd Diag-1 lesion is 50% stenosed.   2nd Diag-2 lesion is 70% stenosed.   Mid LAD lesion is 60% stenosed.   Prox LAD to Mid LAD lesion is 99% stenosed.   Ost RCA to Prox RCA lesion is 80% stenosed.   Prox RCA lesion is 70% stenosed.   Mid RCA lesion is 40% stenosed.   A stent was successfully placed.   Post intervention, there is a 0% residual stenosis. 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 LVEDP 16 mmHg. At the start of the intervention prior to left main engagement, the patient began to experience severe chest pain with ST segment elevation of approximately 2 to 3 mm.  Following successful intervention, the subtotal LAD occlusion was reduced to 0% with ultimate insertion of a 2.75 x 15 mm Medtronic Onyx frontiers stent postdilated to 3.96 mm with resolution of chest pain and ECG changes and with restoration of brisk TIMI-3 flow. RECOMMENDATION: DAPT for minimum of 1 year if possible.  With his culprit lesion intervention today, plan stabilization and staged PCI to his RCA either tomorrow or the next day.  Medical therapy for concomitant CAD.  Initiate statin therapy if approved by hematology.  MR LIVER W WO CONTRAST  Result Date: 12/21/2020 CLINICAL DATA:  Characterize liver mass EXAM: MRI ABDOMEN WITHOUT AND WITH CONTRAST TECHNIQUE: Multiplanar multisequence MR imaging of the abdomen was performed both before and after the administration of intravenous contrast. CONTRAST:  69mL GADAVIST GADOBUTROL 1 MMOL/ML IV SOLN COMPARISON:   CT abdomen pelvis, 12/10/2020 FINDINGS: Lower chest: No acute findings. Hepatobiliary: There is a large, contrast hypoenhancing mass of the inferior right lobe of the liver, hepatic segment VI, measuring 9.4 x 8.7 cm (series 21, image 50). This appears to occlude or at least compress the adjacent branch right portal veins, without definite evidence of tumor thrombus. Numerous tiny gallstones in the dependent gallbladder. No gallbladder wall thickening. No biliary ductal dilatation. Pancreas: No mass, inflammatory changes, or other parenchymal abnormality identified. No pancreatic ductal dilatation. Spleen:  Within normal limits in size and appearance. Adrenals/Urinary Tract: No masses identified. No evidence of hydronephrosis. Stomach/Bowel: Visualized portions within the abdomen are unremarkable. Vascular/Lymphatic: No pathologically enlarged lymph nodes identified. No abdominal aortic aneurysm demonstrated. Other:  None. Musculoskeletal: Diffusely heterogeneous, contrast enhancing vertebral bone marrow. IMPRESSION: 1. There is a large, contrast hypoenhancing mass of the inferior right lobe of the liver, hepatic segment VI, measuring 9.4 x 8.7 cm, which appears to occlude or at least compress the adjacent branch right portal veins, without definite evidence of tumor thrombus. Appearance and contrast enhancement characteristics favor intrahepatic cholangiocarcinoma, although hepatocellular carcinoma is a differential consideration, particularly in the high risk setting of chronic hepatitis C. Prostate metastatic disease is not favored. LI-RADS category M. 2. 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 3. No lymphadenopathy or evidence of organ metastatic disease in the abdomen. 4.  Cholelithiasis without evidence of acute cholecystitis. Electronically Signed   By: Eddie Candle M.D.   On: 12/21/2020 07:49   ECHOCARDIOGRAM  COMPLETE  Result Date: 12/19/2020    ECHOCARDIOGRAM REPORT   Patient Name:   Edgar Mooney Rhue Date of Exam: 12/19/2020 Medical Rec #:  [id number]     Height:       62.0 in Accession #:    [id number]    Weight:       160.0 lb Date of Birth:  [birth year]     BSA:          1.739 m Patient Age:    21 years      BP:           160/79 mmHg Patient Gender: M             HR:           57 bpm. Exam Location:  Inpatient Procedure: 2D Echo Indications:    chest pain  History:        Patient has no prior history of Echocardiogram examinations.                 Risk Factors:Hypertension and polysubstance abuse.  Sonographer:    Johny Chess RDCS Referring Phys: [id number] [place]  1. Left ventricular ejection fraction, by estimation, is 50 to 55%. The left ventricle has low normal function. The left ventricle demonstrates regional wall motion abnormalities with basal to mid inferior hypokinesis. Left ventricular diastolic parameters were normal.  2. Right ventricular systolic function is normal. The right ventricular size is normal. Tricuspid regurgitation signal is inadequate for assessing PA pressure.  3. Left atrial size was mild to moderately dilated.  4. The mitral valve is normal in structure. No evidence of mitral valve regurgitation. No evidence of mitral stenosis.  5. The aortic valve is tricuspid. Aortic valve regurgitation is not visualized. No aortic stenosis is present.  6. The inferior vena cava is normal in size with greater than 50% respiratory variability, suggesting right atrial pressure of 3 mmHg. FINDINGS  Left Ventricle: Left ventricular ejection fraction, by estimation, is 50 to 55%. The left ventricle has low normal function. The left ventricle demonstrates regional wall motion abnormalities. The left ventricular internal cavity size was normal in size. There is no left ventricular hypertrophy. Left ventricular diastolic parameters were normal. Right Ventricle: The right ventricular size is  normal. No increase in right ventricular wall thickness. Right ventricular systolic function is normal. Tricuspid regurgitation signal is inadequate for assessing PA pressure. Left Atrium: Left atrial size was mild  to moderately dilated. Right Atrium: Right atrial size was normal in size. Pericardium: There is no evidence of pericardial effusion. Mitral Valve: The mitral valve is normal in structure. No evidence of mitral valve regurgitation. No evidence of mitral valve stenosis. Tricuspid Valve: The tricuspid valve is normal in structure. Tricuspid valve regurgitation is not demonstrated. Aortic Valve: The aortic valve is tricuspid. Aortic valve regurgitation is not visualized. No aortic stenosis is present. Pulmonic Valve: The pulmonic valve was normal in structure. Pulmonic valve regurgitation is not visualized. Aorta: The aortic root is normal in size and structure. Venous: The inferior vena cava is normal in size with greater than 50% respiratory variability, suggesting right atrial pressure of 3 mmHg. IAS/Shunts: No atrial level shunt detected by color flow Doppler.  LEFT VENTRICLE PLAX 2D LVIDd:         5.10 cm  Diastology LVIDs:         3.20 cm  LV e' medial:    8.92 cm/s LV PW:         0.90 cm  LV E/e' medial:  11.0 LV IVS:        0.80 cm  LV e' lateral:   11.20 cm/s LVOT diam:     2.10 cm  LV E/e' lateral: 8.8 LV SV:         68 LV SV Index:   39 LVOT Area:     3.46 cm  RIGHT VENTRICLE             IVC RV S prime:     15.40 cm/s  IVC diam: 1.90 cm TAPSE (M-mode): 2.4 cm LEFT ATRIUM             Index       RIGHT ATRIUM           Index LA diam:        3.80 cm 2.19 cm/m  RA Area:     11.90 cm LA Vol (A2C):   75.9 ml 43.65 ml/m RA Volume:   26.70 ml  15.36 ml/m LA Vol (A4C):   69.1 ml 39.74 ml/m LA Biplane Vol: 75.3 ml 43.31 ml/m  AORTIC VALVE LVOT Vmax:   101.00 cm/s LVOT Vmean:  61.800 cm/s LVOT VTI:    0.197 m  AORTA Ao Root diam: 3.00 cm Ao Asc diam:  3.30 cm MITRAL VALVE MV Area (PHT): 3.00 cm     SHUNTS MV Decel Time: 253 msec    Systemic VTI:  0.20 m MV E velocity: 98.50 cm/s  Systemic Diam: 2.10 cm MV A velocity: 62.90 cm/s MV E/A ratio:  1.57 Dalton McleanMD Electronically signed by Franki Monte Signature Date/Time: 12/19/2020/5:32:35 PM    Final     EKG: NSR no acute changes    ASSESSMENT AND PLAN:   CAD:  9/5 post stenting proximal LAD And staged stenting of proximal and mid RCA EF 50-55% continue DAT No beta blocker as patient had cocaine in blood stream on admission HLD:  continue crestor  HTN:  Well controlled.  Continue current medications and low sodium Dash type diet.   Cancer:  metastatic prostate and hepatocellular f/u Dr Annitta Jersey    F/U 3 mnths   Signed: Jenkins Rouge 01/06/2021, 1:39 PM

## 2021-01-04 NOTE — Progress Notes (Signed)
The proposed treatment discussed in conference is for discussion purpose only and is not a binding recommendation.  The patients have not been physically examined, or presented with their treatment options.  Therefore, final treatment plans cannot be decided.  

## 2021-01-05 ENCOUNTER — Telehealth (HOSPITAL_COMMUNITY): Payer: Self-pay | Admitting: Pharmacist

## 2021-01-05 ENCOUNTER — Other Ambulatory Visit (HOSPITAL_COMMUNITY): Payer: Self-pay

## 2021-01-05 ENCOUNTER — Encounter: Payer: Self-pay | Admitting: *Deleted

## 2021-01-05 ENCOUNTER — Inpatient Hospital Stay: Payer: BC Managed Care – PPO | Admitting: Nurse Practitioner

## 2021-01-05 ENCOUNTER — Inpatient Hospital Stay: Payer: BC Managed Care – PPO | Attending: Nurse Practitioner

## 2021-01-05 DIAGNOSIS — C61 Malignant neoplasm of prostate: Secondary | ICD-10-CM | POA: Insufficient documentation

## 2021-01-05 DIAGNOSIS — C7951 Secondary malignant neoplasm of bone: Secondary | ICD-10-CM | POA: Insufficient documentation

## 2021-01-05 NOTE — Telephone Encounter (Signed)
Pharmacy Transitions of Care Follow-up Telephone Call  Date of discharge: 12/23/20  Discharge Diagnosis: unstable angina and NSTEMI with stent placed  Note: use home number vs mobile  How have you been since you were released from the hospital?  No more chest pain, no SOB, bruising more easily  Medication changes made at discharge:  - START:  amLODipine (NORVASC)  Aspirin Low Dose (aspirin) (Pt reports only taking on occasion) Brilinta (ticagrelor) (Pt reports only taking once daily) nitroGLYCERIN (NITROSTAT)  rosuvastatin (CRESTOR)   - STOPPED: n/a  - CHANGED: n/a  Pt also on Zytiga and Prednisone  Medication changes verified by the patient?  Yes, but non-compliant     Medication Accessibility:  Home Pharmacy: [facility]   Was the patient provided with refills on discharged medications? yes   Have all prescriptions been transferred from [facility] to home pharmacy?  Yes, fills at [facility]  Is the patient able to afford medications? yes Notable copays: All copays were $0 Eligible patient assistance: not needed    Medication Review:   TICAGRELOR (BRILINTA) Ticagrelor 90 mg BID initiated on 12/23/20.  - Educated patient on expected duration of therapy of aspirin 81mg  with ticagrelor for 12 mo. Advised patient that dose of ticagrelor will be reduced after 1 year. Aspirin may be continued indefinitely. - Discussed importance of taking medication around the same time every day, - Reviewed potential DDIs with patient - Advised patient of medications to avoid (NSAIDs, aspirin maintenance doses>100 mg daily) - Educated that Tylenol (acetaminophen) will be the preferred analgesic to prevent risk of bleeding  - Emphasized importance of monitoring for signs and symptoms of bleeding (abnormal bruising, prolonged bleeding, nose bleeds, bleeding from gums, discolored urine, black tarry stools)  - Educated patient to notify doctor if shortness of breath or abnormal heartbeat occur -  Advised patient to alert all providers of antiplatelet therapy prior to starting a new medication or having a procedure    Follow-up Appointments:  PCP Hospital f/u appt confirmed? None scheduled at this time  [facility] f/u appt confirmed? Cardiology scheduled to see Dr. Jenkins Rouge on 01/06/21 @ 1:45 pm.   If their condition worsens, is the pt aware to call PCP or go to the Emergency Dept.? yes  Final Patient Assessment:  Pt is doing well overall; however, he reports non-compliance with his medications and was unsure of why he was taking them.  We have reviewed all meds including indication and directions for use.  Pt was also concerned about his alcohol use.  We have discussed increased bleed risk while using alcohol plus ASA. I have encouraged  him to reduce alcohol consumption.  Pt also expressed concern over copays.  Right now copays are $0.  However, this may change after his plan starts over after the new year.  I have asked him to reach out to [place] for help with his Zytiga copay or his brilinta copay.  Coupons and assistance available.  Edgar Mooney was pleasant and had several questions, I am hopeful he will take an interest in his health and improved his compliance.

## 2021-01-05 NOTE — Progress Notes (Signed)
Was "no show" for appointment today. Confirmed w/radiology that he has a telephone consult with Dr. Pascal Lux on 01/10/21. Per Dr. Benay Spice: Scheduled for 9/29 or 9/30 for lab/OV and Zometa and Lupron. Scheduling message sent

## 2021-01-06 ENCOUNTER — Other Ambulatory Visit: Payer: Self-pay

## 2021-01-06 ENCOUNTER — Ambulatory Visit (INDEPENDENT_AMBULATORY_CARE_PROVIDER_SITE_OTHER): Payer: BC Managed Care – PPO | Admitting: Cardiovascular Disease

## 2021-01-06 ENCOUNTER — Encounter: Payer: Self-pay | Admitting: Cardiovascular Disease

## 2021-01-06 VITALS — BP 120/54 | HR 79 | Ht 62.0 in | Wt 162.0 lb

## 2021-01-06 DIAGNOSIS — I2510 Atherosclerotic heart disease of native coronary artery without angina pectoris: Secondary | ICD-10-CM | POA: Diagnosis not present

## 2021-01-06 DIAGNOSIS — I10 Essential (primary) hypertension: Secondary | ICD-10-CM | POA: Diagnosis not present

## 2021-01-06 DIAGNOSIS — E782 Mixed hyperlipidemia: Secondary | ICD-10-CM | POA: Diagnosis not present

## 2021-01-06 NOTE — Patient Instructions (Signed)
Medication Instructions:  *If you need a refill on your cardiac medications before your next appointment, please call your pharmacy*  Lab Work: If you have labs (blood work) drawn today and your tests are completely normal, you will receive your results only by: [place] (if you have MyChart) OR A paper copy in the mail If you have any lab test that is abnormal or we need to change your treatment, we will call you to review the results.  Follow-Up: At [facility], you and your health needs are our priority.  As part of our continuing mission to provide you with exceptional heart care, we have created designated Provider Care Teams.  These Care Teams include your primary Cardiologist (physician) and Advanced Practice Providers (APPs -  Physician Assistants and Nurse Practitioners) who all work together to provide you with the care you need, when you need it.  We recommend signing up for the patient portal called "MyChart".  Sign up information is provided on this After Visit Summary.  MyChart is used to connect with patients for Virtual Visits (Telemedicine).  Patients are able to view lab/test results, encounter notes, upcoming appointments, etc.  Non-urgent messages can be sent to your provider as well.   To learn more about what you can do with MyChart, go to [email or web address].    Your next appointment:   3 month(s)  The format for your next appointment:   In Person  Provider:   You may see Dr. Johnsie Cancel or one of the following Advanced Practice Providers on your designated Care Team:   Cecilie Kicks, NP

## 2021-01-10 ENCOUNTER — Ambulatory Visit: Admission: RE | Admit: 2021-01-10 | Payer: BC Managed Care – PPO | Source: Ambulatory Visit

## 2021-01-10 ENCOUNTER — Other Ambulatory Visit: Payer: Self-pay

## 2021-01-11 ENCOUNTER — Other Ambulatory Visit: Payer: Self-pay | Admitting: Genetic Counselor

## 2021-01-11 ENCOUNTER — Ambulatory Visit
Admission: RE | Admit: 2021-01-11 | Discharge: 2021-01-11 | Disposition: A | Discharge status: Home / Self Care | Payer: BC Managed Care – PPO | Source: Ambulatory Visit | Attending: Oncology | Admitting: Oncology

## 2021-01-11 ENCOUNTER — Other Ambulatory Visit: Payer: Self-pay

## 2021-01-11 DIAGNOSIS — C220 Liver cell carcinoma: Secondary | ICD-10-CM | POA: Diagnosis not present

## 2021-01-11 DIAGNOSIS — C61 Malignant neoplasm of prostate: Secondary | ICD-10-CM

## 2021-01-11 HISTORY — PX: IR RADIOLOGIST EVAL & MGMT: IMG5224

## 2021-01-11 NOTE — Consult Note (Signed)
Chief Complaint: Patient was seen in consultation today for liver mass  Referring Physician(s): Sherrill,Gary B  History of Present Illness: Edgar Mooney is a 55 y.o. male with history of alcohol abuse, hepatitis C (treated 3-4 years ago), coronary artery disease, and prostate cancer who presented in August 2022 with right sided abdominal pain. CT was obtained which demonstrated a large right hepatic mass, new since November 2021.  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  Serum a-FP is elevated at >47,000.    He states that the initial right abdominal pain has improved.  He is otherwise feeling well.  No jaundice, scleral icterus, nausea, vomiting, or change in bowel habits.  He continues to drink, endorsing 2-3 beers per week, and a six pack on the weekends, stating this is much less than he used to drink.    Past Medical History:  Diagnosis Date   Alcohol abuse    Cocaine abuse ([place])    Depression    Gout    Hep C w/o coma, chronic ([place]) diagnosed May 2016    Past Surgical History:  Procedure Laterality Date   CORONARY STENT INTERVENTION N/A 12/21/2020   Procedure: CORONARY STENT INTERVENTION;  Surgeon: Troy Sine, MD;  Location: [facility];  Service: Cardiovascular;  Laterality: N/A;   CORONARY STENT INTERVENTION N/A 12/22/2020   Procedure: CORONARY STENT INTERVENTION;  Surgeon: Burnell Blanks, MD;  Location: [facility];  Service: Cardiovascular;  Laterality: N/A;   LEFT HEART CATH AND CORONARY ANGIOGRAPHY N/A 12/21/2020   Procedure: LEFT HEART CATH AND CORONARY ANGIOGRAPHY;  Surgeon: Troy Sine, MD;  Location: [facility];  Service: Cardiovascular;  Laterality: N/A;    Allergies: Patient has no known allergies.  Medications: Prior to Admission medications   Medication Sig Start Date End Date Taking? Authorizing Provider  abiraterone acetate (ZYTIGA) 250 MG tablet TAKE 4 TABLETS  (1,000 MG TOTAL) BY MOUTH DAILY. TAKE ON AN EMPTY STOMACH 1 HOUR BEFORE OR 2 HOURS AFTER A MEAL 12/27/20 12/27/21  Ladell Pier, MD  allopurinol (ZYLOPRIM) 100 MG tablet TAKE 1 TABLET BY MOUTH EVERY DAY START ON 12/24 09/05/20   Ladell Pier, MD  amLODipine (NORVASC) 5 MG tablet Take 1 tablet (5 mg total) by mouth daily. 12/24/20   Charlynne Cousins, MD  aspirin 81 MG chewable tablet Chew 1 tablet (81 mg total) by mouth daily. 12/24/20   Charlynne Cousins, MD  ferrous gluconate (FERGON) 324 MG tablet Take 1 tablet (324 mg total) by mouth 2 (two) times daily with a meal. 03/07/20   Guilford Shi, MD  indomethacin (INDOCIN) 50 MG capsule Take 1 capsule (50 mg total) by mouth 3 (three) times daily with meals. For gout flare 04/01/20   Owens Shark, NP  nitroGLYCERIN (NITROSTAT) 0.4 MG SL tablet Place 1 tablet (0.4 mg total) under the tongue every 5 (five) minutes as needed for chest pain. 12/23/20   Charlynne Cousins, MD  ondansetron (ZOFRAN) 4 MG tablet Take 1 tablet (4 mg total) by mouth every 6 (six) hours as needed for nausea. 04/01/20   Ladell Pier, MD  oxyCODONE-acetaminophen (PERCOCET/ROXICET) 5-325 MG tablet Take 1 tablet by mouth every 4 (four) hours as needed for severe pain. 12/14/20   Ladell Pier, MD  predniSONE (DELTASONE) 5 MG tablet TAKE 1 TABLET (5 MG TOTAL) BY MOUTH DAILY WITH BREAKFAST. Patient taking differently: Take  5 mg by mouth daily with breakfast. 10/25/20 10/25/21  Ladell Pier, MD  rosuvastatin (CRESTOR) 20 MG tablet Take 1 tablet (20 mg total) by mouth daily. 12/24/20   Charlynne Cousins, MD  tamsulosin (FLOMAX) 0.4 MG CAPS capsule TAKE 1 CAPSULE BY MOUTH EVERY DAY AFTER SUPPER Patient taking differently: Take 0.4 mg by mouth daily. 09/05/20   Ladell Pier, MD  ticagrelor (BRILINTA) 90 MG TABS tablet Take 1 tablet (90 mg total) by mouth 2 (two) times daily. 12/23/20   Charlynne Cousins, MD     Family History  Problem Relation Age of Onset    Cancer Mother    Cancer Father     Social History   Socioeconomic History   Marital status: Single    Spouse name: Not on file   Number of children: Not on file   Years of education: Not on file   Highest education level: Not on file  Occupational History   Not on file  Tobacco Use   Smoking status: Every Day    Packs/day: 1.00    Years: 25.00    Pack years: 25.00    Types: Cigarettes   Smokeless tobacco: Never  Substance and Sexual Activity   Alcohol use: Not Currently    Alcohol/week: 126.0 standard drinks    Types: 126 Cans of beer per week    Comment: 18 pack per day   Drug use: Yes    Types: Cocaine, IV, Heroin, Marijuana    Comment: Used crack and cocaine 04/02/16   Sexual activity: Not Currently  Other Topics Concern   Not on file  Social History Narrative   Not on file   Social Determinants of Health   Financial Resource Strain: High Risk   Difficulty of Paying Living Expenses: Hard  Food Insecurity: Food Insecurity Present   Worried About Running Out of Food in the Last Year: Sometimes true   Ran Out of Food in the Last Year: Never true  Transportation Needs: No Transportation Needs   Lack of Transportation (Medical): No   Lack of Transportation (Non-Medical): No  Physical Activity: Not on file  Stress: Not on file  Social Connections: Moderately Isolated   Frequency of Communication with Friends and Family: Once a week   Frequency of Social Gatherings with Friends and Family: Once a week   Attends Religious Services: 1 to 4 times per year   Active Member of [employer] or Organizations: No   Attends Music therapist: 1 to 4 times per year   Marital Status: Separated    ECOG Status: 1 - Symptomatic but completely ambulatory  Review of Systems: A 12 point ROS discussed and pertinent positives are indicated in the HPI above.  All other systems are negative.  Review of Systems  Vital Signs: There were no vitals taken for this  visit.  Physical Exam  Imaging: MRI 12/21/20  10 cm mass. Hepatic vascular anatomy appears conventional.  Labs:  CBC: Recent Labs    12/19/20 1158 12/21/20 0102 12/22/20 0210 12/23/20 0225  WBC 6.9 7.1 6.9 7.1  HGB 12.6* 11.8* 11.7* 10.9*  HCT 38.2* 33.9* 34.2* 31.9*  PLT 175 169 167 150    COAGS: Recent Labs    03/03/20 2335 12/14/20 1003  INR 1.4* 0.9    BMP: Recent Labs    12/19/20 1158 12/21/20 0102 12/22/20 0210 12/23/20 0225  NA 138 136 138 136  K 3.8 4.1 4.1 3.4*  CL 111 106 108 106  CO2 20* 21* 22 21*  GLUCOSE 106* 110* 109* 120*  BUN 14 26* 20 17  CALCIUM 9.2 9.3 9.3 9.3  CREATININE 0.71 0.91 0.84 0.69  GFRNONAA >60 >60 >60 >60    LIVER FUNCTION TESTS: Recent Labs    10/03/20 1140 12/10/20 1004 12/19/20 1158 12/21/20 0102  BILITOT 1.0 1.0 0.4 0.5  AST 40 49* 34 34  ALT 52* 52* 38 35  ALKPHOS 83 75 62 43  PROT 6.9 6.8 6.2* 5.9*  ALBUMIN 4.3 3.9 3.3* 3.3*    TUMOR MARKERS: No results for input(s): AFPTM, CEA, CA199, CHROMGRNA in the last 8760 hours.  AFP (12/14/20) - 12,878  Child Pugh A (6 pts) ALBI Grade 2 (-1.29 pts)  Assessment and Plan: 55 year old male without established history of cirrhosis though does have a history of treated hepatitis C and alcoholism found to have a large (10 cm) right segment V/VI hepatoma with associated elevated a-FP (>47,000) compatible with hepatocellular carcinoma.  He would be an excellent candidate for transarterial radioembolization, likely segmentectomy approach.   Risks and benefits discussed with the patient including, but not limited to bleeding, infection, vascular injury, post procedural pain, nausea, vomiting and fatigue, contrast induced renal failure, liver failure, radiation injury to the bowel, radiation induced cholecystitis, neutropenia and possible need for additional procedures.  All of the patient's questions were answered, patient is agreeable to proceed.  Plan for hepatic  angiogram and Tc-MAA administration followed by Y-90 radioembolization at [facility].   Thank you for this interesting consult.  I greatly enjoyed meeting KENZEL RUESCH and look forward to participating in their care.  A copy of this report was sent to the requesting provider on this date.  Electronically Signed: Suzette Battiest, MD 01/11/2021, 1:04 PM   I spent a total of  40 Minutes  in telephone clinical consultation, greater than 50% of which was counseling/coordinating care for hepatocellular carcinoma.

## 2021-01-13 ENCOUNTER — Inpatient Hospital Stay: Payer: BC Managed Care – PPO

## 2021-01-13 ENCOUNTER — Encounter: Payer: Self-pay | Admitting: Nurse Practitioner

## 2021-01-13 ENCOUNTER — Inpatient Hospital Stay (HOSPITAL_BASED_OUTPATIENT_CLINIC_OR_DEPARTMENT_OTHER): Payer: BC Managed Care – PPO | Admitting: Nurse Practitioner

## 2021-01-13 ENCOUNTER — Other Ambulatory Visit: Payer: Self-pay

## 2021-01-13 VITALS — BP 140/74 | HR 71 | Temp 98.1°F | Resp 18 | Wt 162.0 lb

## 2021-01-13 DIAGNOSIS — C61 Malignant neoplasm of prostate: Secondary | ICD-10-CM

## 2021-01-13 DIAGNOSIS — C7951 Secondary malignant neoplasm of bone: Secondary | ICD-10-CM | POA: Diagnosis not present

## 2021-01-13 DIAGNOSIS — R160 Hepatomegaly, not elsewhere classified: Secondary | ICD-10-CM | POA: Diagnosis not present

## 2021-01-13 LAB — CMP (CANCER CENTER ONLY)
ALT: 43 U/L (ref 0–44)
AST: 41 U/L (ref 15–41)
Albumin: 4.9 g/dL (ref 3.5–5.0)
Alkaline Phosphatase: 76 U/L (ref 38–126)
Anion gap: 11 (ref 5–15)
BUN: 17 mg/dL (ref 6–20)
CO2: 21 mmol/L — ABNORMAL LOW (ref 22–32)
Calcium: 9.7 mg/dL (ref 8.9–10.3)
Chloride: 104 mmol/L (ref 98–111)
Creatinine: 0.62 mg/dL (ref 0.61–1.24)
GFR, Estimated: >60 mL/min (ref >60)
Glucose, Bld: 118 mg/dL — ABNORMAL HIGH (ref 70–99)
Potassium: 4.1 mmol/L (ref 3.5–5.1)
Sodium: 136 mmol/L (ref 135–145)
Total Bilirubin: 1.3 mg/dL — ABNORMAL HIGH (ref 0.3–1.2)
Total Protein: 7.8 g/dL (ref 6.5–8.1)

## 2021-01-13 LAB — CBC WITH DIFFERENTIAL (CANCER CENTER ONLY)
Abs Immature Granulocytes: 0.03 10*3/uL (ref 0.00–0.07)
Basophils Absolute: 0.1 10*3/uL (ref 0.0–0.1)
Basophils Relative: 1 %
Eosinophils Absolute: 0.1 10*3/uL (ref 0.0–0.5)
Eosinophils Relative: 1 %
HCT: 34.5 % — ABNORMAL LOW (ref 39.0–52.0)
Hemoglobin: 12.4 g/dL — ABNORMAL LOW (ref 13.0–17.0)
Immature Granulocytes: 0 %
Lymphocytes Relative: 12 %
Lymphs Abs: 1 10*3/uL (ref 0.7–4.0)
MCH: 31.6 pg (ref 26.0–34.0)
MCHC: 35.9 g/dL (ref 30.0–36.0)
MCV: 88 fL (ref 80.0–100.0)
Monocytes Absolute: 0.5 10*3/uL (ref 0.1–1.0)
Monocytes Relative: 5 %
Neutro Abs: 6.8 10*3/uL (ref 1.7–7.7)
Neutrophils Relative %: 81 %
Platelet Count: 189 10*3/uL (ref 150–400)
RBC: 3.92 MIL/uL — ABNORMAL LOW (ref 4.22–5.81)
RDW: 12.9 % (ref 11.5–15.5)
WBC Count: 8.4 10*3/uL (ref 4.0–10.5)
nRBC: 0 % (ref 0.0–0.2)

## 2021-01-13 MED ORDER — SODIUM CHLORIDE 0.9 % IV SOLN: Freq: Once | INTRAVENOUS | Status: AC

## 2021-01-13 MED ORDER — OXYCODONE-ACETAMINOPHEN 5-325 MG PO TABS
1.0000 | ORAL_TABLET | ORAL | 0 refills | Status: DC | PRN
Start: 2021-01-13 — End: 2021-02-16

## 2021-01-13 MED ORDER — ZOLEDRONIC ACID 4 MG/100ML IV SOLN
4.0000 mg | Freq: Once | INTRAVENOUS | Status: AC
  Administered 2021-01-13: 4 mg via INTRAVENOUS
  Filled 2021-01-13: qty 100

## 2021-01-13 MED ORDER — LEUPROLIDE ACETATE (3 MONTH) 22.5 MG ~~LOC~~ KIT
22.5000 mg | PACK | Freq: Once | SUBCUTANEOUS | Status: AC
  Administered 2021-01-13: 22.5 mg via SUBCUTANEOUS
  Filled 2021-01-13: qty 22.5

## 2021-01-13 NOTE — Progress Notes (Signed)
[facility] OFFICE PROGRESS NOTE   Diagnosis: Prostate cancer, [place]  INTERVAL HISTORY:   Edgar Mooney returns for follow-up.  He is feeling better.  No nausea or vomiting.  No constipation or diarrhea.  No hot flashes.  Appetite varies.  He continues to have intermittent right-sided abdominal pain.  Percocet provides temporary relief.  He estimates 4 to 5 tablets a day.  Objective:  Vital signs in last 24 hours:  Blood pressure 140/74, pulse 71, temperature 98.1 F (36.7 C), temperature source Temporal, resp. rate 18, weight 162 lb (73.5 kg), SpO2 98 %.    HEENT: No thrush or ulcers. Resp: Lungs clear bilaterally. Cardio: Regular rate and rhythm. GI: No hepatomegaly.  Tender right upper abdomen. Vascular: No leg edema.   Lab Results:  Lab Results  Component Value Date   WBC 8.4 01/13/2021   HGB 12.4 (L) 01/13/2021   HCT 34.5 (L) 01/13/2021   MCV 88.0 01/13/2021   PLT 189 01/13/2021   NEUTROABS 6.8 01/13/2021    Imaging:  No results found.  Medications: I have reviewed the patient's current medications.  Assessment/Plan: 1. Metastatic prostate cancer - Lytic bone lesions with retroperitoneal adenopathy concerning for metastatic prostate cancer -03/03/2020-CTA chest/abdomen/pelvis widespread osseous metastatic disease and lower retroperitoneal adenopathy, pathologic right anterior third rib fracture, probable spinal canal tumor at the level of S1, -MRI cervical, thoracic, and lumbar spine 03/03/2020-diffuse osseous metastatic disease, ventral epidural tumor impinging on right S1 nerve root, extraosseous tumor at the bilateral ilium, asymmetric enhancing material at the right C5-6 canal-right  -03/03/2020-PSA 763 -Degarelix 03/04/2020 -Abiraterone/prednisone 03/14/2020 -Every 28-month Lupron 04/01/2020 -04/01/2020 PSA 36.5 -05/31/2020 PSA 1.2 -06/28/2020 PSA 1.0 -12/14/2020 PSA 6.4 2.  Severe anemia-likely secondary to metastatic prostate cancer  involving the bones 3.  Mild thrombocytopenia 4.  Cirrhosis with splenomegaly 5.  History of hepatitis C 6.  History of polysubstance abuse 7.  Depression 8.  Tobacco dependence 9.  Right arm weakness, right facial numbness-potentially related to nerve root compromise from metastatic bone lesions 10.  Pain secondary to #1 11.  Extensive bone metastases-every 54-month Zometa starting 04/01/2020 12.  Gout-acute flare right wrist 04/01/2020 treated with indomethacin, allopurinol resumed 13.  Right hepatic lobe mass with elevated AFP concerning for [facility] MRI abdomen 12/21/2020-hypoenhancing inferior right liver mass, segment 6, occluding or compressing the adjacent right portal vein branch, intrahepatic cholangiocarcinoma favored with differential including hepatocellular carcinoma, Li-Rads M Plan is for Y 90 radioembolization 14.  Admission 12/19/2020 with an NSTEMI Catheterization 12/21/2020-severe multivessel CAD, 99% proximal LAD stenosis, LAD stent placed  Disposition: Edgar Mooney appears stable.  The PSA from last month was higher.  Plan to continue Zytiga and every 47-month Lupron for now.  We will follow-up on the value from today.  He is scheduled for Zometa today.  He was recently diagnosed with hepatocellular carcinoma involving a right liver mass.  The plan is for Y 90 radioembolization.  He has pain related to the mass.  New Percocet prescription sent to his pharmacy.  He continues follow-up with cardiology regarding the recent MI.  He will return for lab and follow-up in 6 weeks.  He will contact the office in the interim with any problems.  Patient seen with Dr. Benay Spice.    Ned Card ANP/GNP-BC   01/13/2021  1:58 PM This was a shared visit with Ned Card.  Edgar Mooney appears stable.  He will continue abiraterone and Lupron for treatment of metastatic prostate cancer.  He has been diagnosed with  hepatocellular carcinoma.  His case was presented at the GI tumor conference.  The  recommendation is to proceed with radioembolization of the right liver mass.  I was present for greater than 50% of today's visit.  I performed medical decision making.  Julieanne Manson, MD

## 2021-01-13 NOTE — Patient Instructions (Signed)
Leuprolide depot injection What is this medication? LEUPROLIDE (loo PROE lide) is a man-made protein that acts like a natural hormone in the body. It decreases testosterone in men and decreases estrogen in women. In men, this medicine is used to treat advanced prostate cancer. In women, some forms of this medicine may be used to treat endometriosis, uterine fibroids, or other male hormone-related problems. This medicine may be used for other purposes; ask your health care provider or pharmacist if you have questions. COMMON BRAND NAME(S): Eligard, Fensolv, Lupron Depot, Lupron Depot-Ped, Viadur What should I tell my care team before I take this medication? They need to know if you have any of these conditions: diabetes heart disease or previous heart attack high blood pressure high cholesterol mental illness osteoporosis pain or difficulty passing urine seizures spinal cord metastasis stroke suicidal thoughts, plans, or attempt; a previous suicide attempt by you or a family member tobacco smoker unusual vaginal bleeding (women) an unusual or allergic reaction to leuprolide, benzyl alcohol, other medicines, foods, dyes, or preservatives pregnant or trying to get pregnant breast-feeding How should I use this medication? This medicine is for injection into a muscle or for injection under the skin. It is given by a health care professional in a hospital or clinic setting. The specific product will determine how it will be given to you. Make sure you understand which product you receive and how often you will receive it. Talk to your pediatrician regarding the use of this medicine in children. Special care may be needed. Overdosage: If you think you have taken too much of this medicine contact a poison control center or emergency room at once. NOTE: This medicine is only for you. Do not share this medicine with others. What if I miss a dose? It is important not to miss a dose. Call your  doctor or health care professional if you are unable to keep an appointment. Depot injections: Depot injections are given either once-monthly, every 12 weeks, every 16 weeks, or every 24 weeks depending on the product you are prescribed. The product you are prescribed will be based on if you are male or male, and your condition. Make sure you understand your product and dosing. What may interact with this medication? Do not take this medicine with any of the following medications: chasteberry cisapride dronedarone pimozide thioridazine This medicine may also interact with the following medications: herbal or dietary supplements, like black cohosh or DHEA male hormones, like estrogens or progestins and birth control pills, patches, rings, or injections male hormones, like testosterone other medicines that prolong the QT interval (abnormal heart rhythm) This list may not describe all possible interactions. Give your health care provider a list of all the medicines, herbs, non-prescription drugs, or dietary supplements you use. Also tell them if you smoke, drink alcohol, or use illegal drugs. Some items may interact with your medicine. What should I watch for while using this medication? Visit your doctor or health care professional for regular checks on your progress. During the first weeks of treatment, your symptoms may get worse, but then will improve as you continue your treatment. You may get hot flashes, increased bone pain, increased difficulty passing urine, or an aggravation of nerve symptoms. Discuss these effects with your doctor or health care professional, some of them may improve with continued use of this medicine. Male patients may experience a menstrual cycle or spotting during the first months of therapy with this medicine. If this continues, contact your doctor or  health care professional. This medicine may increase blood sugar. Ask your healthcare provider if changes in diet  or medicines are needed if you have diabetes. What side effects may I notice from receiving this medication? Side effects that you should report to your doctor or health care professional as soon as possible: allergic reactions like skin rash, itching or hives, swelling of the face, lips, or tongue breathing problems chest pain depression or memory disorders pain in your legs or groin pain at site where injected or implanted seizures severe headache signs and symptoms of high blood sugar such as being more thirsty or hungry or having to urinate more than normal. You may also feel very tired or have blurry vision swelling of the feet and legs suicidal thoughts or other mood changes visual changes vomiting Side effects that usually do not require medical attention (report to your doctor or health care professional if they continue or are bothersome): breast swelling or tenderness decrease in sex drive or performance diarrhea hot flashes loss of appetite muscle, joint, or bone pains nausea redness or irritation at site where injected or implanted skin problems or acne This list may not describe all possible side effects. Call your doctor for medical advice about side effects. You may report side effects to FDA at 1-800-FDA-1088. Where should I keep my medication? This drug is given in a hospital or clinic and will not be stored at home. NOTE: This sheet is a summary. It may not cover all possible information. If you have questions about this medicine, talk to your doctor, pharmacist, or health care provider.  2022 Elsevier/Gold Standard (2019-03-04 10:35:13)  Zoledronic Acid Injection (Hypercalcemia, Oncology) What is this medication? ZOLEDRONIC ACID (ZOE le dron ik AS id) slows calcium loss from bones. It high calcium levels in the blood from some kinds of cancer. It may be used in other people at risk for bone loss. This medicine may be used for other purposes; ask your health care  provider or pharmacist if you have questions. COMMON BRAND NAME(S): Zometa What should I tell my care team before I take this medication? They need to know if you have any of these conditions: cancer dehydration dental disease kidney disease liver disease low levels of calcium in the blood lung or breathing disease (asthma) receiving steroids like dexamethasone or prednisone an unusual or allergic reaction to zoledronic acid, other medicines, foods, dyes, or preservatives pregnant or trying to get pregnant breast-feeding How should I use this medication? This drug is injected into a vein. It is given by a health care provider in a hospital or clinic setting. Talk to your health care provider about the use of this drug in children. Special care may be needed. Overdosage: If you think you have taken too much of this medicine contact a poison control center or emergency room at once. NOTE: This medicine is only for you. Do not share this medicine with others. What if I miss a dose? Keep appointments for follow-up doses. It is important not to miss your dose. Call your health care provider if you are unable to keep an appointment. What may interact with this medication? certain antibiotics given by injection NSAIDs, medicines for pain and inflammation, like ibuprofen or naproxen some diuretics like bumetanide, furosemide teriparatide thalidomide This list may not describe all possible interactions. Give your health care provider a list of all the medicines, herbs, non-prescription drugs, or dietary supplements you use. Also tell them if you smoke, drink alcohol, or use  illegal drugs. Some items may interact with your medicine. What should I watch for while using this medication? Visit your health care provider for regular checks on your progress. It may be some time before you see the benefit from this drug. Some people who take this drug have severe bone, joint, or muscle pain. This drug  may also increase your risk for jaw problems or a broken thigh bone. Tell your health care provider right away if you have severe pain in your jaw, bones, joints, or muscles. Tell you health care provider if you have any pain that does not go away or that gets worse. Tell your dentist and dental surgeon that you are taking this drug. You should not have major dental surgery while on this drug. See your dentist to have a dental exam and fix any dental problems before starting this drug. Take good care of your teeth while on this drug. Make sure you see your dentist for regular follow-up appointments. You should make sure you get enough calcium and vitamin D while you are taking this drug. Discuss the foods you eat and the vitamins you take with your health care provider. Check with your health care provider if you have severe diarrhea, nausea, and vomiting, or if you sweat a lot. The loss of too much body fluid may make it dangerous for you to take this drug. You may need blood work done while you are taking this drug. Do not become pregnant while taking this drug. Women should inform their health care provider if they wish to become pregnant or think they might be pregnant. There is potential for serious harm to an unborn child. Talk to your health care provider for more information. What side effects may I notice from receiving this medication? Side effects that you should report to your doctor or health care provider as soon as possible: allergic reactions (skin rash, itching or hives; swelling of the face, lips, or tongue) bone pain infection (fever, chills, cough, sore throat, pain or trouble passing urine) jaw pain, especially after dental work joint pain kidney injury (trouble passing urine or change in the amount of urine) low blood pressure (dizziness; feeling faint or lightheaded, falls; unusually weak or tired) low calcium levels (fast heartbeat; muscle cramps or pain; pain, tingling, or  numbness in the hands or feet; seizures) low magnesium levels (fast, irregular heartbeat; muscle cramp or pain; muscle weakness; tremors; seizures) low red blood cell counts (trouble breathing; feeling faint; lightheaded, falls; unusually weak or tired) muscle pain redness, blistering, peeling, or loosening of the skin, including inside the mouth severe diarrhea swelling of the ankles, feet, hands trouble breathing Side effects that usually do not require medical attention (report to your doctor or health care provider if they continue or are bothersome): anxious constipation coughing depressed mood eye irritation, itching, or pain fever general ill feeling or flu-like symptoms nausea pain, redness, or irritation at site where injected trouble sleeping This list may not describe all possible side effects. Call your doctor for medical advice about side effects. You may report side effects to FDA at 1-800-FDA-1088. Where should I keep my medication? This drug is given in a hospital or clinic. It will not be stored at home. NOTE: This sheet is a summary. It may not cover all possible information. If you have questions about this medicine, talk to your doctor, pharmacist, or health care provider.  2022 Elsevier/Gold Standard (2019-01-15 09:13:00)

## 2021-01-15 LAB — PROSTATE-SPECIFIC AG, SERUM (LABCORP): Prostate Specific Ag, Serum: 10.6 ng/mL — ABNORMAL HIGH (ref 0.0–4.0)

## 2021-01-16 ENCOUNTER — Telehealth: Payer: Self-pay

## 2021-01-16 LAB — GENETIC SCREENING ORDER

## 2021-01-16 NOTE — Telephone Encounter (Signed)
-----   Message from Owens Shark, NP sent at 01/16/2021  8:31 AM EDT ----- Please let him know the PSA is slightly higher and confirm he is taking the prednisone/abiraterone

## 2021-01-17 ENCOUNTER — Other Ambulatory Visit (HOSPITAL_COMMUNITY): Payer: Self-pay

## 2021-01-23 ENCOUNTER — Other Ambulatory Visit (HOSPITAL_COMMUNITY): Payer: Self-pay

## 2021-01-25 ENCOUNTER — Other Ambulatory Visit: Payer: Self-pay | Admitting: Oncology

## 2021-01-25 ENCOUNTER — Other Ambulatory Visit (HOSPITAL_COMMUNITY): Payer: Self-pay

## 2021-01-25 ENCOUNTER — Other Ambulatory Visit (HOSPITAL_COMMUNITY): Payer: Self-pay | Admitting: Internal Medicine

## 2021-01-25 MED ORDER — ABIRATERONE ACETATE 250 MG PO TABS
ORAL_TABLET | Freq: Every day | ORAL | 0 refills | Status: DC
Start: 2021-01-25 — End: 2021-05-16
  Filled 2021-01-25: qty 120, 30d supply, fill #0

## 2021-01-26 ENCOUNTER — Other Ambulatory Visit (HOSPITAL_COMMUNITY): Payer: Self-pay

## 2021-01-26 ENCOUNTER — Other Ambulatory Visit (HOSPITAL_COMMUNITY): Payer: Self-pay | Admitting: Interventional Radiology

## 2021-01-26 DIAGNOSIS — R160 Hepatomegaly, not elsewhere classified: Secondary | ICD-10-CM

## 2021-02-08 ENCOUNTER — Other Ambulatory Visit: Payer: Self-pay | Admitting: Nurse Practitioner

## 2021-02-08 DIAGNOSIS — C61 Malignant neoplasm of prostate: Secondary | ICD-10-CM

## 2021-02-09 ENCOUNTER — Telehealth: Payer: Self-pay | Admitting: Oncology

## 2021-02-09 NOTE — Telephone Encounter (Signed)
Scheduled appt per 10/26 sch msg - unable to reach patient . Left message for patient with appt date and time

## 2021-02-14 ENCOUNTER — Other Ambulatory Visit: Payer: Self-pay | Admitting: Radiology

## 2021-02-15 ENCOUNTER — Other Ambulatory Visit (HOSPITAL_COMMUNITY): Payer: Self-pay | Admitting: Interventional Radiology

## 2021-02-15 ENCOUNTER — Ambulatory Visit (HOSPITAL_COMMUNITY)
Admission: RE | Admit: 2021-02-15 | Discharge: 2021-02-15 | Disposition: A | Discharge status: Home / Self Care | Payer: BC Managed Care – PPO | Source: Ambulatory Visit | Attending: Interventional Radiology | Admitting: Interventional Radiology

## 2021-02-15 ENCOUNTER — Encounter (HOSPITAL_COMMUNITY)
Admission: RE | Admit: 2021-02-15 | Discharge: 2021-02-15 | Disposition: A | Discharge status: Home / Self Care | Payer: BC Managed Care – PPO | Source: Ambulatory Visit | Attending: Interventional Radiology | Admitting: Interventional Radiology

## 2021-02-15 ENCOUNTER — Encounter (HOSPITAL_COMMUNITY): Payer: Self-pay

## 2021-02-15 ENCOUNTER — Other Ambulatory Visit: Payer: Self-pay

## 2021-02-15 DIAGNOSIS — Z7982 Long term (current) use of aspirin: Secondary | ICD-10-CM | POA: Diagnosis not present

## 2021-02-15 DIAGNOSIS — R160 Hepatomegaly, not elsewhere classified: Secondary | ICD-10-CM | POA: Insufficient documentation

## 2021-02-15 DIAGNOSIS — M109 Gout, unspecified: Secondary | ICD-10-CM | POA: Diagnosis not present

## 2021-02-15 DIAGNOSIS — C220 Liver cell carcinoma: Secondary | ICD-10-CM

## 2021-02-15 DIAGNOSIS — Z955 Presence of coronary angioplasty implant and graft: Secondary | ICD-10-CM | POA: Insufficient documentation

## 2021-02-15 DIAGNOSIS — F1011 Alcohol abuse, in remission: Secondary | ICD-10-CM | POA: Diagnosis not present

## 2021-02-15 DIAGNOSIS — I2510 Atherosclerotic heart disease of native coronary artery without angina pectoris: Secondary | ICD-10-CM | POA: Insufficient documentation

## 2021-02-15 DIAGNOSIS — Z8546 Personal history of malignant neoplasm of prostate: Secondary | ICD-10-CM | POA: Diagnosis not present

## 2021-02-15 DIAGNOSIS — Z79899 Other long term (current) drug therapy: Secondary | ICD-10-CM | POA: Diagnosis not present

## 2021-02-15 DIAGNOSIS — F1410 Cocaine abuse, uncomplicated: Secondary | ICD-10-CM | POA: Insufficient documentation

## 2021-02-15 DIAGNOSIS — C229 Malignant neoplasm of liver, not specified as primary or secondary: Secondary | ICD-10-CM | POA: Diagnosis not present

## 2021-02-15 DIAGNOSIS — F32A Depression, unspecified: Secondary | ICD-10-CM | POA: Diagnosis not present

## 2021-02-15 HISTORY — PX: IR 3D INDEPENDENT WKST: IMG2385

## 2021-02-15 HISTORY — PX: IR US GUIDE VASC ACCESS RIGHT: IMG2390

## 2021-02-15 HISTORY — PX: IR ANGIOGRAM SELECTIVE EACH ADDITIONAL VESSEL: IMG667

## 2021-02-15 HISTORY — PX: IR ANGIOGRAM VISCERAL SELECTIVE: IMG657

## 2021-02-15 LAB — CBC WITH DIFFERENTIAL/PLATELET
Abs Immature Granulocytes: 0.04 10*3/uL (ref 0.00–0.07)
Basophils Absolute: 0.1 10*3/uL (ref 0.0–0.1)
Basophils Relative: 1 %
Eosinophils Absolute: 0.3 10*3/uL (ref 0.0–0.5)
Eosinophils Relative: 4 %
HCT: 39.1 % (ref 39.0–52.0)
Hemoglobin: 13.5 g/dL (ref 13.0–17.0)
Immature Granulocytes: 1 %
Lymphocytes Relative: 18 %
Lymphs Abs: 1.5 10*3/uL (ref 0.7–4.0)
MCH: 31.3 pg (ref 26.0–34.0)
MCHC: 34.5 g/dL (ref 30.0–36.0)
MCV: 90.5 fL (ref 80.0–100.0)
Monocytes Absolute: 0.7 10*3/uL (ref 0.1–1.0)
Monocytes Relative: 9 %
Neutro Abs: 5.6 10*3/uL (ref 1.7–7.7)
Neutrophils Relative %: 67 %
Platelets: 229 10*3/uL (ref 150–400)
RBC: 4.32 MIL/uL (ref 4.22–5.81)
RDW: 13.2 % (ref 11.5–15.5)
WBC: 8.2 10*3/uL (ref 4.0–10.5)
nRBC: 0 % (ref 0.0–0.2)

## 2021-02-15 LAB — COMPREHENSIVE METABOLIC PANEL
ALT: 57 U/L — ABNORMAL HIGH (ref 0–44)
AST: 57 U/L — ABNORMAL HIGH (ref 15–41)
Albumin: 4.8 g/dL (ref 3.5–5.0)
Alkaline Phosphatase: 87 U/L (ref 38–126)
Anion gap: 7 (ref 5–15)
BUN: 18 mg/dL (ref 6–20)
CO2: 23 mmol/L (ref 22–32)
Calcium: 9.2 mg/dL (ref 8.9–10.3)
Chloride: 106 mmol/L (ref 98–111)
Creatinine, Ser: 0.69 mg/dL (ref 0.61–1.24)
GFR, Estimated: >60 mL/min (ref >60)
Glucose, Bld: 102 mg/dL — ABNORMAL HIGH (ref 70–99)
Potassium: 4.1 mmol/L (ref 3.5–5.1)
Sodium: 136 mmol/L (ref 135–145)
Total Bilirubin: 1.5 mg/dL — ABNORMAL HIGH (ref 0.3–1.2)
Total Protein: 8.1 g/dL (ref 6.5–8.1)

## 2021-02-15 LAB — PROTIME-INR
INR: 0.9 (ref 0.8–1.2)
Prothrombin Time: 12.2 seconds (ref 11.4–15.2)

## 2021-02-15 IMAGING — NM NM MISC PROCEDURE
3 series · 18 of 18 positions shown · non-contrast
Comparison: MRI [DATE]

:
CLINICAL DATA: High cell carcinoma of the RIGHT hepatic lobe. Pre
yttrium 90 radio embolization vascular mapping.

EXAM:
NUCLEAR MEDICINE LIVER SCAN
TECHNIQUE: Abdominal images were obtained in multiple projections after
intrahepatic arterial injection of radiopharmaceutical. SPECT
imaging was performed. Lung shunt calculation was performed.
RADIOPHARMACEUTICALS: [YE] MAA TECHNETIUM TO 99M ALBUMIN
AGGREGATED

[Series 1: spect - (id) _(id)_tra · 4.1mm · 4.14mm/px · 6 of 128 frames shown]
[frame 11/128]
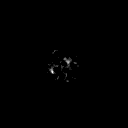
[frame 32/128]
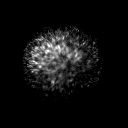
[frame 54/128]
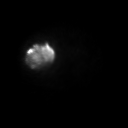
[frame 75/128]
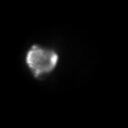
[frame 96/128]
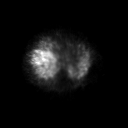
[frame 118/128]
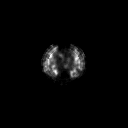

[Series 1: spect - (id) _(id)_cor · 4.1mm · 4.14mm/px · 6 of 128 frames shown]
[frame 11/128]
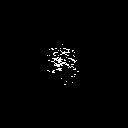
[frame 32/128]
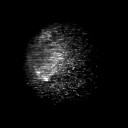
[frame 54/128]
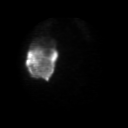
[frame 75/128]
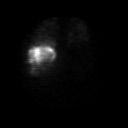
[frame 96/128]
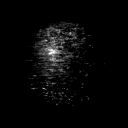
[frame 118/128]
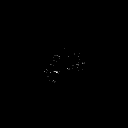

[Series 2: maa spect · 4.14mm/px · 6 of 64 frames shown]
[frame 6/64]
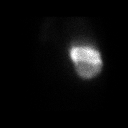
[frame 16/64]
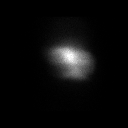
[frame 27/64]
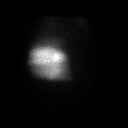
[frame 38/64]
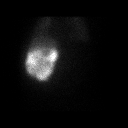
[frame 48/64]
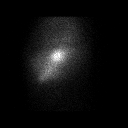
[frame 59/64]
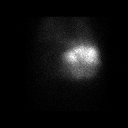

[18 of 18 positions shown; findings below may reference images not displayed]

FINDINGS: The injected microaggregated albumin localizes within the liver
RIGHT hepatic lobe. No evidence of activity within the stomach,
duodenum, or bowel.

Calculated shunt fraction to the lungs equals 12.2%.
IMPRESSION: 1. No significant extrahepatic radiotracer activity following
intrahepatic arterial injection of MAA.
2. Lung shunt fraction equals 12.2%

## 2021-02-15 IMAGING — XA IR ANGIO/VISCERAL SELECTIVE EA VESSEL WO/W FLUSH
4 of 5 series · 12 of 24 positions shown · non-contrast
Comparison: none

INDICATION: 55-year-old male with history of hepatitis C and alcoholic
cirrhosis, metastatic prostate cancer, and large right hepatic mass
compatible with hepatocellular carcinoma given elevated serum alpha
fetoprotein.

[Series 1: processed: ir embo arterial not (person_ · arterial · 5 acquisitions, 2 frames shown]
[im 1/5]
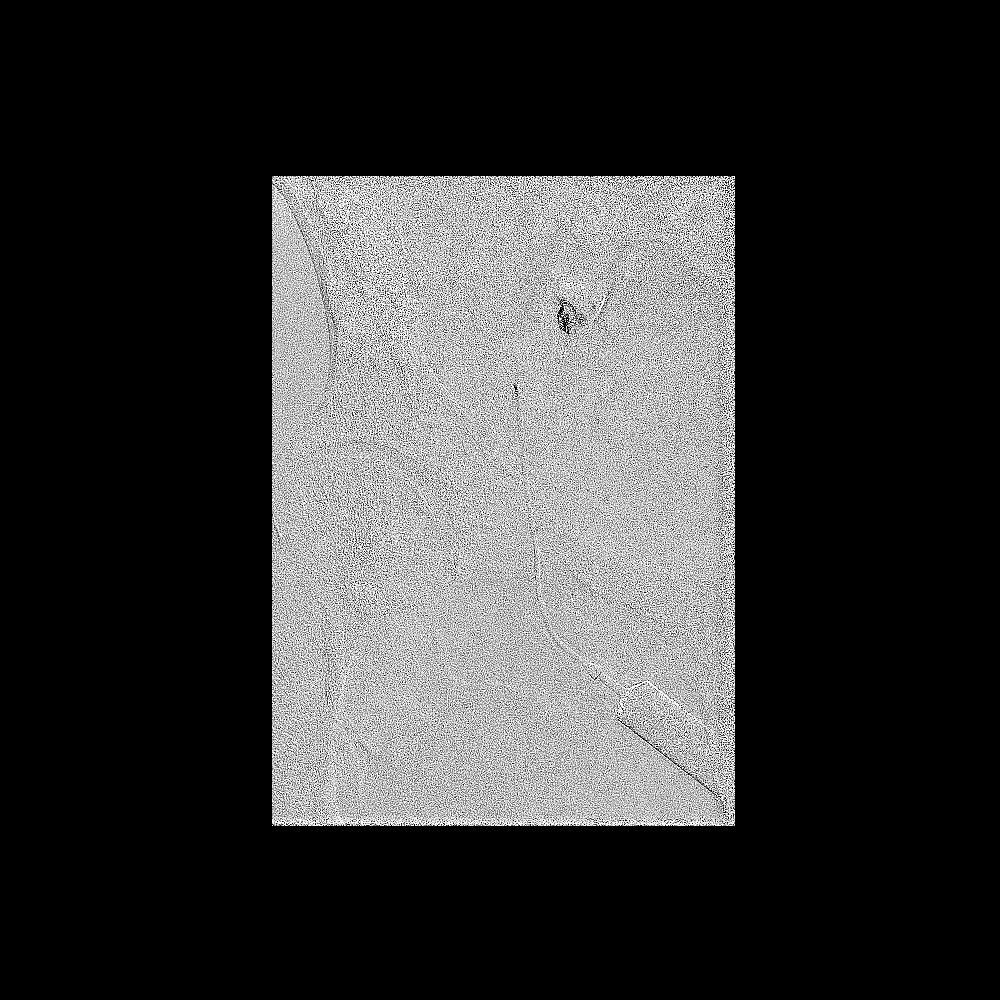
[im 1/5]
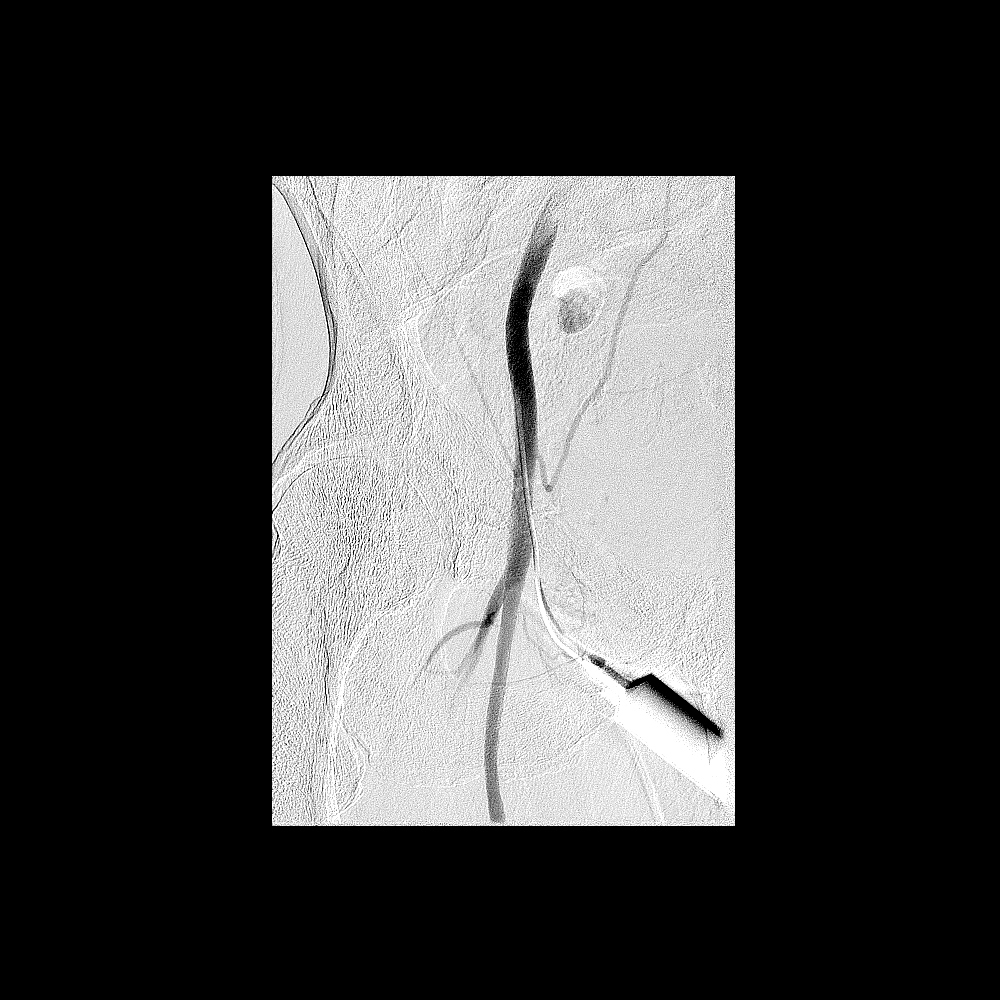

[Series 550: spin (id) · axial · 1.9mm · 0.48mm/px · z∈[-689,-515]mm · 3 of 114 slices shown]
[im 23/114]
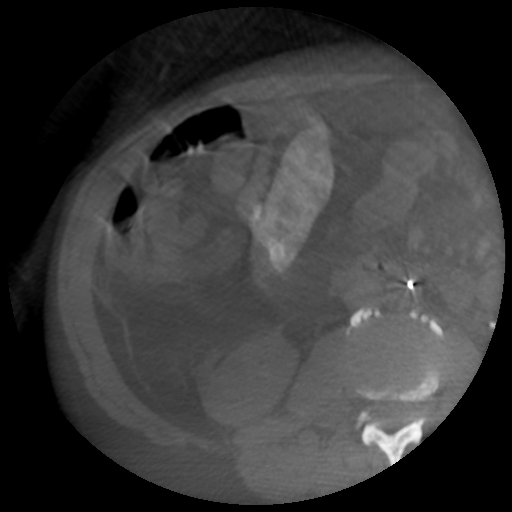
[im 68/114]
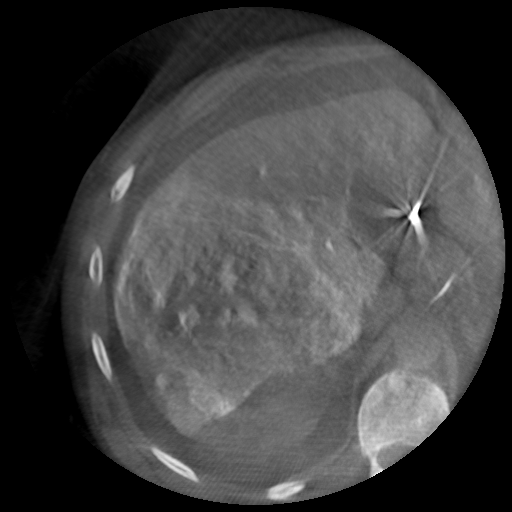
[im 114/114]
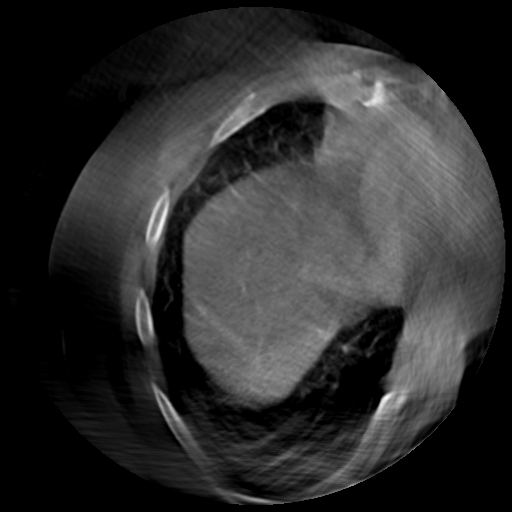

[Series 756: axial spin · axial · 1.9mm · 0.48mm/px · z∈[-713,-544]mm · 3 of 125 slices shown (1 of 2)]
[im 18/125]
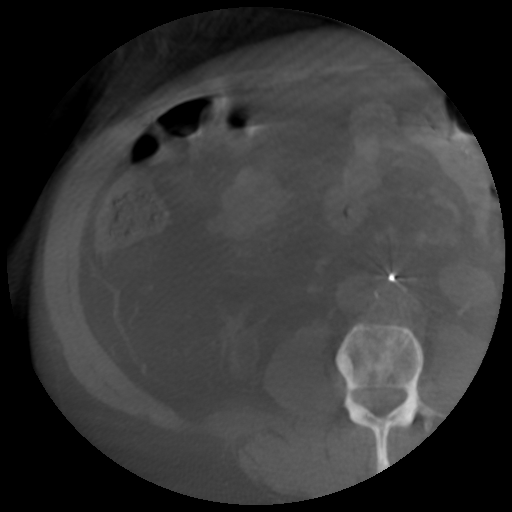
[im 71/125]
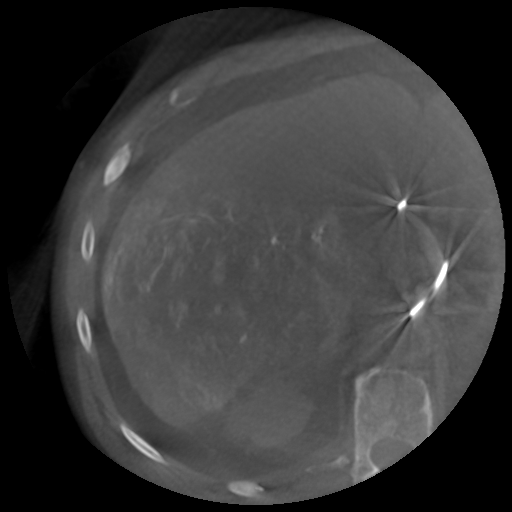
[im 107/125]
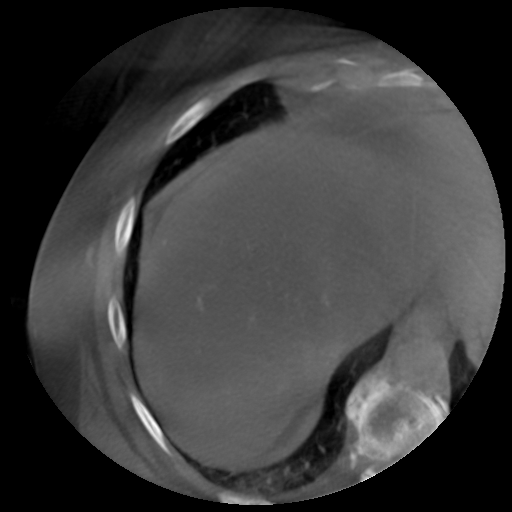

[Series 757: axial spin · axial · 1.9mm · 0.48mm/px · z∈[-745,-509]mm · 4 of 125 slices shown (2 of 2)]
[im 1/125]
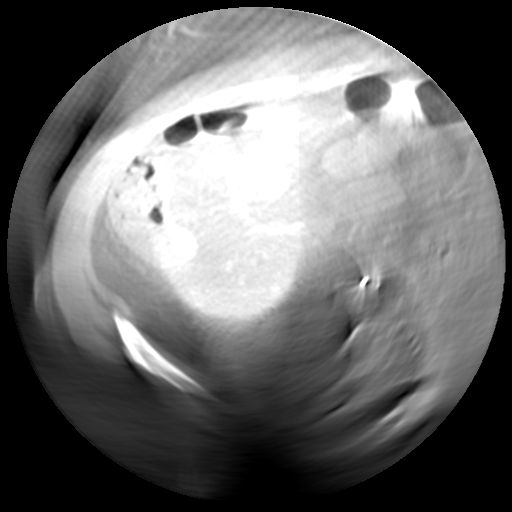
[im 36/125]
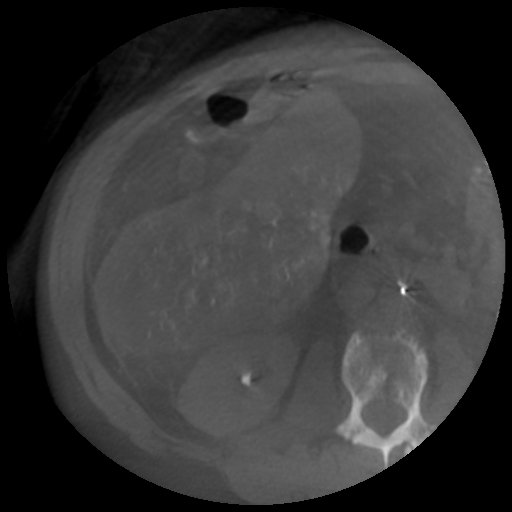
[im 89/125]
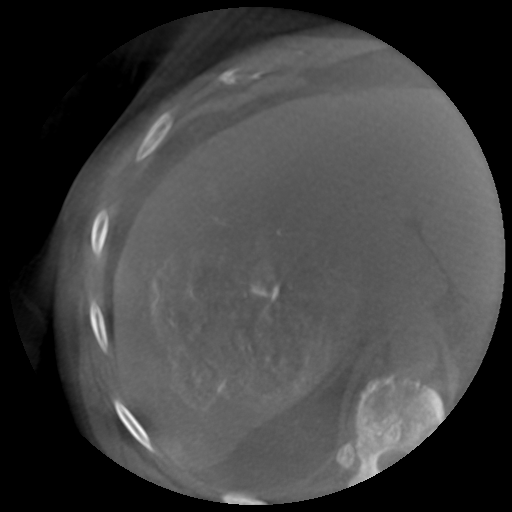
[im 125/125]
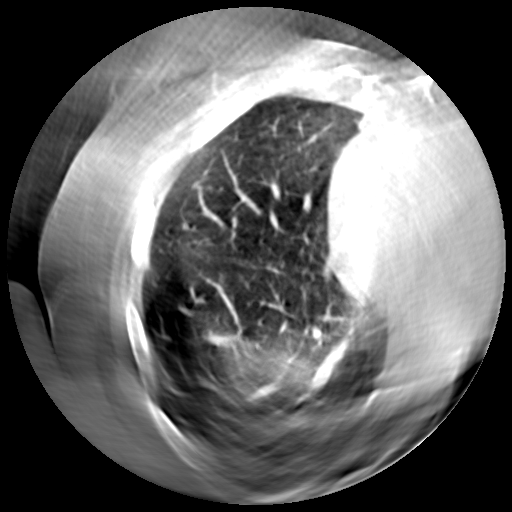

[12 of 24 positions shown; findings below may reference images not displayed]

EXAM:
1. Ultrasound-guided access of the right common femoral artery

2. Catheterization and angiography of the celiac, right, and right
posterior hepatic arteries

3. Cone beam CT

4. MAA injection into the right posterior hepatic artery

MEDICATIONS:
None.

ANESTHESIA/SEDATION:
Moderate (conscious) sedation was employed during this procedure. A
total of Versed 2 mg and Fentanyl 100 mcg was administered
intravenously.

Moderate Sedation Time: 48 minutes. The patient's level of
consciousness and vital signs were monitored continuously by
radiology nursing throughout the procedure under my direct
supervision.

CONTRAST:  100mL OMNIPAQUE IOHEXOL 300 MG/ML SOLN, 50mL OMNIPAQUE
IOHEXOL 300 MG/ML SOLN

FLUOROSCOPY TIME:  Fluoroscopy Time: 5 minutes 24 seconds (345 mGy).

COMPLICATIONS:
None immediate.



A preliminary ultrasound of the right groin was performed and
demonstrates a patent right common femoral artery. A permanent
ultrasound image was recorded. Using a combination of fluoroscopy
and ultrasound, an access site was determined. The overlying skin
was anesthetized with 1% Lidocaine. Using ultrasound guidance,
access into the right common femoral artery was obtained with
visualization of needle entry into the vessel using standard
micropuncture technique. Limited angiogram of the common femoral
artery demonstrates appropriate site for a closure device. A 5
French sheath was placed.

A C2 catheter was advanced into the celiac artery. Limited celiac
angiography demonstrates conventional anatomy. The catheter was then
used to select the proper hepatic artery and angiography was
performed, demonstrating a hypervascular mass in the right lobe,
consistent with hepatocellular carcinoma. A cone beam CT was then
performed which demonstrated hypervascular mass with vascular supply
exclusively from the right posterior hepatic artery. A Progreat
Omega microcatheter and fathom 16 microwire were used to select the
right posterior hepatic artery. Angiography demonstrates supply to
the targeted mass. There is no evidence of non-target extra-hepatic
branches. Cone beam CT was performed, demonstrating arterial supply
to the large mass with a few small satellite lesions in segment 6.
There is no evidence of extrahepatic arterial branches. Per
protocol, MAA was then infused into the right posterior hepatic
artery. All wires and catheters were removed. Hemostasis was
achieved using an Angio-Seal device. There were no immediate
complications. Peripheral pulses were unchanged. The patient was
transferred to the recovery area in stable condition.
IMPRESSION: 1. Visceral angiography demonstrating a hypervascular mass in the
right hepatic lobe, consistent with hepatocellular carcinoma.

2. MAA infusion into the right posterior hepatic artery.

3. We will follow up shunt fraction calculations and calculate liver
volumes in preparation for radioembolization therapy.

## 2021-02-15 IMAGING — NM NM LIVER IMG SPECT
3 series · 18 of 18 positions shown · non-contrast
Comparison: MRI [DATE]

CLINICAL DATA: High cell carcinoma of the RIGHT hepatic lobe. Pre
yttrium 90 radio embolization vascular mapping.

EXAM:
NUCLEAR MEDICINE LIVER SCAN
TECHNIQUE: Abdominal images were obtained in multiple projections after
intrahepatic arterial injection of radiopharmaceutical. SPECT
imaging was performed. Lung shunt calculation was performed.
RADIOPHARMACEUTICALS:  [OP] MAA TECHNETIUM TO 99M ALBUMIN
AGGREGATED

[Series 1: spect - (id) _(id)_tra · 4.1mm · 4.14mm/px · 6 of 128 frames shown]
[frame 11/128]
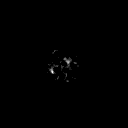
[frame 32/128]
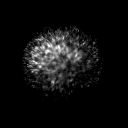
[frame 54/128]
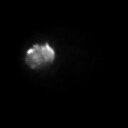
[frame 75/128]
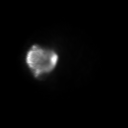
[frame 96/128]
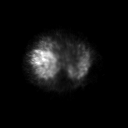
[frame 118/128]
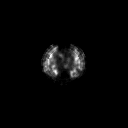

[Series 1: spect - (id) _(id)_cor · 4.1mm · 4.14mm/px · 6 of 128 frames shown]
[frame 11/128]
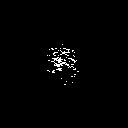
[frame 32/128]
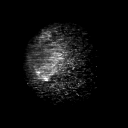
[frame 54/128]
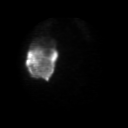
[frame 75/128]
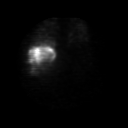
[frame 96/128]
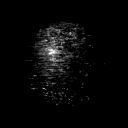
[frame 118/128]
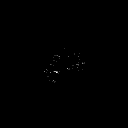

[Series 2: maa spect · 4.14mm/px · 6 of 64 frames shown]
[frame 6/64]
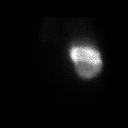
[frame 16/64]
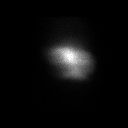
[frame 27/64]
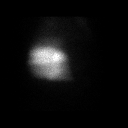
[frame 38/64]
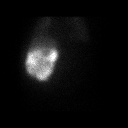
[frame 48/64]
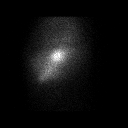
[frame 59/64]
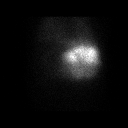

[18 of 18 positions shown; findings below may reference images not displayed]

FINDINGS: The injected microaggregated albumin localizes within the liver
RIGHT hepatic lobe. No evidence of activity within the stomach,
duodenum, or bowel.

Calculated shunt fraction to the lungs equals 12.2%.
IMPRESSION: 1. No significant extrahepatic radiotracer activity following
intrahepatic arterial injection of MAA.
2. Lung shunt fraction equals 12.2%

## 2021-02-15 MED ORDER — LIDOCAINE HCL 1 % IJ SOLN
INTRAMUSCULAR | Status: AC
  Filled 2021-02-15: qty 20

## 2021-02-15 MED ORDER — SODIUM CHLORIDE 0.9 % IV SOLN: INTRAVENOUS | Status: DC

## 2021-02-15 MED ORDER — MIDAZOLAM HCL 2 MG/2ML IJ SOLN
INTRAMUSCULAR | Status: AC
  Filled 2021-02-15: qty 2

## 2021-02-15 MED ORDER — FENTANYL CITRATE (PF) 100 MCG/2ML IJ SOLN
INTRAMUSCULAR | Status: AC
  Filled 2021-02-15: qty 2

## 2021-02-15 MED ORDER — FENTANYL CITRATE (PF) 100 MCG/2ML IJ SOLN
INTRAMUSCULAR | Status: AC | PRN
  Administered 2021-02-15: 50 ug via INTRAVENOUS

## 2021-02-15 MED ORDER — TECHNETIUM TO 99M ALBUMIN AGGREGATED
4.1000 | Freq: Once | INTRAVENOUS | Status: AC | PRN
  Administered 2021-02-15: 4.1 via INTRAVENOUS

## 2021-02-15 MED ORDER — MIDAZOLAM HCL 2 MG/2ML IJ SOLN
INTRAMUSCULAR | Status: AC | PRN
  Administered 2021-02-15: 1 mg via INTRAVENOUS

## 2021-02-15 MED ORDER — IOHEXOL 300 MG/ML  SOLN
100.0000 mL | Freq: Once | INTRAMUSCULAR | Status: AC | PRN
  Administered 2021-02-15: 100 mL via INTRA_ARTERIAL

## 2021-02-15 MED ORDER — IOHEXOL 300 MG/ML  SOLN
100.0000 mL | Freq: Once | INTRAMUSCULAR | Status: AC | PRN
  Administered 2021-02-15: 50 mL via INTRA_ARTERIAL

## 2021-02-15 MED ORDER — LIDOCAINE HCL (PF) 1 % IJ SOLN
INTRAMUSCULAR | Status: AC | PRN
  Administered 2021-02-15: 2 mL via INTRADERMAL

## 2021-02-15 MED ORDER — LIDOCAINE HCL (PF) 1 % IJ SOLN
INTRAMUSCULAR | Status: AC | PRN
  Administered 2021-02-15: 5 mL via INTRADERMAL

## 2021-02-15 NOTE — Procedures (Signed)
Interventional Radiology Procedure Note  Procedure:  1) Hepatic angiogram 2) Intra-artrial Tc-MAA administration   Findings: Please refer to procedural dictation for full description.  Large right hepatic mass arising from segment VI and VII branches.  Tc-MAA administered from proximal posterior right hepatic artery branch.  R CFA 6 Fr Angioseal closure.  Complications: None immediate.  Estimated Blood Loss: < 5 mL  Recommendations: Strict 4 hour bedrest (head of bed flat for 2 hours, up to 30 degrees for 2 hours). Plan for discharge home today, follow up in 2 weeks for Y-90 administration.   Ruthann Cancer, MD Pager: [phone]

## 2021-02-15 NOTE — H&P (Signed)
Referring Physician(s): Sherrill,B  Supervising Physician: Ruthann Cancer  Patient Status:  [facility]  Chief Complaint: Hepatocellular carcinoma   Subjective: Edgar Mooney is a 55 y.o. male with history of alcohol /cocaine abuse, depression, gout, hepatitis C (treated 3-4 years ago), coronary artery disease with prior stenting currently on Brilinta and aspirin, and prostate cancer who presented in August 2022 with right sided abdominal pain. CT was obtained which demonstrated a large right hepatic mass, new since November 2021.  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  Serum a-FP is elevated at >47,000.  He underwent consultation with Dr. Serafina Royals on 01/11/2021 to discuss treatment options and was deemed an appropriate candidate for Y 90 hepatic radioembolization.  He presents today for arterial roadmapping, possible prophylactic embolization and test Y 90 dosing.  He currently denies fever, headache, chest pain, dyspnea, cough, nausea, vomiting or bleeding.  He continues to have right-sided abdominal /flank discomfort.  Past Medical History:  Diagnosis Date   Alcohol abuse    Cocaine abuse ([place])    Depression    Gout    Hep C w/o coma, chronic ([place]) diagnosed May 2016   Past Surgical History:  Procedure Laterality Date   CORONARY STENT INTERVENTION N/A 12/21/2020   Procedure: CORONARY STENT INTERVENTION;  Surgeon: Troy Sine, MD;  Location: [facility];  Service: Cardiovascular;  Laterality: N/A;   CORONARY STENT INTERVENTION N/A 12/22/2020   Procedure: CORONARY STENT INTERVENTION;  Surgeon: Burnell Blanks, MD;  Location: [facility];  Service: Cardiovascular;  Laterality: N/A;   IR RADIOLOGIST EVAL & MGMT  01/11/2021   LEFT HEART CATH AND CORONARY ANGIOGRAPHY N/A 12/21/2020   Procedure: LEFT HEART CATH AND CORONARY ANGIOGRAPHY;  Surgeon: Troy Sine, MD;  Location: [facility];  Service:  Cardiovascular;  Laterality: N/A;      Allergies: Patient has no known allergies.  Medications: Prior to Admission medications   Medication Sig Start Date End Date Taking? Authorizing Provider  abiraterone acetate (ZYTIGA) 250 MG tablet TAKE 4 TABLETS (1,000 MG TOTAL) BY MOUTH DAILY. TAKE ON AN EMPTY STOMACH 1 HOUR BEFORE OR 2 HOURS AFTER A MEAL 01/25/21 01/25/22  Ladell Pier, MD  allopurinol (ZYLOPRIM) 100 MG tablet TAKE 1 TABLET BY MOUTH EVERY DAY START ON 12/24 09/05/20   Ladell Pier, MD  amLODipine (NORVASC) 5 MG tablet Take 1 tablet (5 mg total) by mouth daily. 12/24/20   Charlynne Cousins, MD  aspirin 81 MG chewable tablet Chew 1 tablet (81 mg total) by mouth daily. 12/24/20   Charlynne Cousins, MD  ferrous gluconate (FERGON) 324 MG tablet Take 1 tablet (324 mg total) by mouth 2 (two) times daily with a meal. 03/07/20   Guilford Shi, MD  indomethacin (INDOCIN) 50 MG capsule Take 1 capsule (50 mg total) by mouth 3 (three) times daily with meals. For gout flare 04/01/20   Owens Shark, NP  nitroGLYCERIN (NITROSTAT) 0.4 MG SL tablet Place 1 tablet (0.4 mg total) under the tongue every 5 (five) minutes as needed for chest pain. 12/23/20   Charlynne Cousins, MD  ondansetron (ZOFRAN) 4 MG tablet Take 1 tablet (4 mg total) by mouth every 6 (six) hours as needed for nausea. 04/01/20   Ladell Pier, MD  oxyCODONE-acetaminophen (PERCOCET/ROXICET) 5-325 MG tablet Take 1 tablet by mouth every 4 (four) hours as needed for severe pain. 01/13/21  Owens Shark, NP  predniSONE (DELTASONE) 5 MG tablet TAKE 1 TABLET (5 MG TOTAL) BY MOUTH DAILY WITH BREAKFAST. Patient taking differently: Take 5 mg by mouth daily with breakfast. 10/25/20 10/25/21  Ladell Pier, MD  rosuvastatin (CRESTOR) 20 MG tablet Take 1 tablet (20 mg total) by mouth daily. 12/24/20   Charlynne Cousins, MD  tamsulosin (FLOMAX) 0.4 MG CAPS capsule TAKE 1 CAPSULE BY MOUTH EVERY DAY AFTER SUPPER Patient taking  differently: Take 0.4 mg by mouth daily. 09/05/20   Ladell Pier, MD  ticagrelor (BRILINTA) 90 MG TABS tablet Take 1 tablet (90 mg total) by mouth 2 (two) times daily. 12/23/20   Charlynne Cousins, MD     Vital Signs: BP 128/76   Pulse 61   Temp 98.2 F (36.8 C) (Oral)   Resp 18   Ht 5\' 2"  (1.575 m)   Wt 160 lb (72.6 kg)   SpO2 98%   BMI 29.26 kg/m   Physical Exam awake, alert.  Chest clear to auscultation bilaterally.  Heart with regular rate and rhythm.  Abdomen soft, slightly distended, few bowel sounds, some right upper quadrant/lateral abdominal/flank tenderness to palpation.  No lower extremity edema.  Imaging: No results found.  Labs:  CBC: Recent Labs    12/21/20 0102 12/22/20 0210 12/23/20 0225 01/13/21 1311  WBC 7.1 6.9 7.1 8.4  HGB 11.8* 11.7* 10.9* 12.4*  HCT 33.9* 34.2* 31.9* 34.5*  PLT 169 167 150 189    COAGS: Recent Labs    03/03/20 2335 12/14/20 1003  INR 1.4* 0.9    BMP: Recent Labs    12/21/20 0102 12/22/20 0210 12/23/20 0225 01/13/21 1311  NA 136 138 136 136  K 4.1 4.1 3.4* 4.1  CL 106 108 106 104  CO2 21* 22 21* 21*  GLUCOSE 110* 109* 120* 118*  BUN 26* 20 17 17   CALCIUM 9.3 9.3 9.3 9.7  CREATININE 0.91 0.84 0.69 0.62  GFRNONAA >60 >60 >60 >60    LIVER FUNCTION TESTS: Recent Labs    12/10/20 1004 12/19/20 1158 12/21/20 0102 01/13/21 1311  BILITOT 1.0 0.4 0.5 1.3*  AST 49* 34 34 41  ALT 52* 38 35 43  ALKPHOS 75 62 43 76  PROT 6.8 6.2* 5.9* 7.8  ALBUMIN 3.9 3.3* 3.3* 4.9    Assessment and Plan: 55 y.o. male with history of alcohol /cocaine abuse, depression, gout, hepatitis C (treated 3-4 years ago), coronary artery disease with prior stenting currently on Brilinta and aspirin, and prostate cancer who presented in August 2022 with right sided abdominal pain. CT was obtained which demonstrated a large right hepatic mass, new since November 2021.  This was further evaluated with MRI which was compatible with LIRADS M,  measuring up to 10 cm.  Serum a-FP is elevated at >47,000.  He underwent consultation with Dr. Serafina Royals on 01/11/2021 to discuss treatment options and was deemed an appropriate candidate for Y 90 hepatic radioembolization.  He presents today for arterial roadmapping, possible prophylactic embolization and test Y 90 dosing.Risks and benefits of procedure were discussed with the patient including, but not limited to bleeding, infection, vascular injury or contrast induced renal failure.  This interventional procedure involves the use of X-rays and because of the nature of the planned procedure, it is possible that we will have prolonged use of X-ray fluoroscopy.  Potential radiation risks to you include (but are not limited to) the following: - A slightly elevated risk for cancer  several years later  in life. This risk is typically less than 0.5% percent. This risk is low in comparison to the normal incidence of human cancer, which is 33% for women and 50% for men according to the [employer]. - Radiation induced injury can include skin redness, resembling a rash, tissue breakdown / ulcers and hair loss (which can be temporary or permanent).   The likelihood of either of these occurring depends on the difficulty of the procedure and whether you are sensitive to radiation due to previous procedures, disease, or genetic conditions.   IF your procedure requires a prolonged use of radiation, you will be notified and given written instructions for further action.  It is your responsibility to monitor the irradiated area for the 2 weeks following the procedure and to notify your physician if you are concerned that you have suffered a radiation induced injury.    All of the patient's questions were answered, patient is agreeable to proceed.  Consent signed and in chart.      Electronically Signed: D. Rowe Robert, PA-C 02/15/2021, 8:32 AM   I spent a total of 25 minutes at the the  patient's bedside AND on the patient's hospital floor or unit, greater than 50% of which was counseling/coordinating care for visceral/hepatic arteriogram with possible embolization and test Y 90 dosing

## 2021-02-15 NOTE — Sedation Documentation (Signed)
Patient transported to [facility] for routine post-procedure scan via stretcher by this RN. VSS. Sleepy but easily aroused and responds to voice. Right groin incision site soft with no bleeding noted on dressing.

## 2021-02-15 NOTE — Discharge Instructions (Addendum)
You may remove your dressing in 24 hours and shower at this time. For problems and concerns, please call Interventional Radiology after hours at [phone]. Do not submerge in tub or pool until site is healed.    Post Y-90 Radioembolization Discharge Instructions  You have been given a radioactive material during your procedure.  While it is safe for you to be discharged home from the hospital, you need to proceed directly home.    Do not use public transportation, including air travel, lasting more than 2 hours for 1 week.  Avoid crowded public places for 1 week.  Adult visitors should try to avoid close contact with you for 1 week.    Children and pregnant females should not visit or have close contact with you for 1 week.  Items that you touch are not radioactive.  Do not sleep in the same bed as your partner for 1 week, and a condom should be used for sexual activity during the first 24 hours.  Your blood may be radioactive and caution should be used if any bleeding occurs during the recovery period.  Body fluids may be radioactive for 24 hours.  Wash your hands after voiding.  Men should sit to urinate.  Dispose of any soiled materials (flush down toilet or place in trash at home) during the first day.  Drink 6 to 8 glasses of fluids per day for 5 days to hydrate yourself.  If you need to see a doctor during the first week, you must let them know that you were treated with yttrium-90 microspheres, and will be slightly radioactive.  They can call Interventional Radiology [phone] with any questions.   Hepatic Artery Radioembolization, Care After The following information offers guidance on how to care for yourself after your procedure. Your health care provider may also give you more specific instructions. If you have problems or questions, contact your health care provider. What can I expect after the procedure? After the procedure, it is possible to have: A slight fever for 7  to 10 days. This may be accompanied by pain, nausea, or vomiting, which is referred to as post-embolization syndrome. You may be given medicine to help relieve these symptoms. If your fever gets worse, tell your health care provider. Tiredness (fatigue). Loss of appetite. This should gradually improve after about 1 week. Abdominal pain on your right side. Soreness and tenderness in your groin area where the needle and catheter were placed (puncture site). Follow these instructions at home: Puncture site care  Follow instructions from your health care provider about how to take care of the puncture site. Make sure you: Wash your hands with soap and water for at least 20 seconds before and after you change your bandage (dressing). If soap and water are not available, use hand sanitizer. Change your dressing as told by your health care provider. Check your puncture site every day for signs of infection. Check for: More redness, swelling, or pain. Fluid or blood. Warmth. Pus or a bad smell. Activity Rest as told by your health care provider. Return to your normal activities as told by your health care provider. Ask your health care provider what activities are safe for you. Avoid sitting for a long time without moving. Get up to take short walks every 1-2 hours. This is important to improve blood flow and breathing. Ask for help if you feel weak or unsteady. If you were given a sedative during the procedure, it can affect you for several hours.  Do not drive or operate machinery until your health care provider says that it is safe. Do not lift anything that is heavier than 10 lb (4.5 kg), or the limit that you are told, until your health care provider says that it is safe. Medicines Take over-the-counter and prescription medicines only as told by your health care provider. Ask your health care provider if the medicine prescribed to you: Requires you to avoid driving or using machinery. Can cause  constipation. You may need to take these actions to prevent or treat constipation: Drink enough fluid to keep your urine pale yellow. Take over-the-counter or prescription medicines. Eat foods that are high in fiber, such as beans, whole grains, and fresh fruits and vegetables. Limit foods that are high in fat and processed sugars, such as fried or sweet foods. Radiation precautions For up to a week after your procedure, there will be a small amount of radioactivity near your liver. This is not especially dangerous to other people. However, as told by your health care provider, you should follow these precautions for 7 days: Do not come in close contact with people. Do not sleep in the same bed as someone else. Do not hold children or babies. Do not have contact with pregnant women. General instructions Eat frequent, small meals until your appetite returns. Follow instructions from your health care provider about eating or drinking restrictions. Do not take baths, swim, or use a hot tub until your health care provider approves. You may take showers. Wash your puncture site with mild soap and water, and pat the area dry. Wear compression stockings as told by your health care provider. These stockings help to prevent blood clots and reduce swelling in your legs. Keep all follow-up visits. This is important. You may need to have blood tests and imaging tests. Contact a health care provider if: You have any of these signs of infection: More redness, swelling, or pain around your puncture site. Fluid or blood coming from your puncture site. Warmth coming from your puncture site. Pus or a bad smell coming from your puncture site. You have pain that: Gets worse. Does not get better with medicine. Feels like very bad heartburn. Is in the middle of your abdomen, above your belly button. You have any signs of infection or liver failure, such as: Your skin or the white parts of your eyes turn yellow  (jaundice). The color of your urine changes to dark brown. The color of your stool (feces) changes to light yellow. Your abdominal measurement (girth) increases in a short period of time. You gain more than 5 lb (2.3 kg) in a short period of time. Get help right away if: You have a fever that lasts more than 10 days or is higher than what your health care provider told you to expect. You develop any of the following in your legs: Pain. Swelling. Skin that is cold or pale or turns blue. You have chest pain. You have blood in your vomit, saliva, or stool. You have trouble breathing. These symptoms may represent a serious problem that is an emergency. Do not wait to see if the symptoms will go away. Get medical help right away. Call your local emergency services (911 in the [place] Do not drive yourself to the hospital. Summary After the procedure, it is possible to have a slight fever for up to 7-10 days, tiredness, loss of appetite, abdominal pain on the right side, and groin tenderness where the catheter was placed. Do  not come in close contact with people for up to a week after your procedure, as told by your health care provider. Follow instructions from your health care provider about how to take care of the puncture site. Contact a health care provider if you have any signs of infection. Get help right away if you develop pain or swelling in your legs or if your legs feel cool or look pale. This information is not intended to replace advice given to you by your health care provider. Make sure you discuss any questions you have with your health care provider. Document Revised: 03/06/2020 Document Reviewed: 03/06/2020 Elsevier Patient Education  2022 [place].  Femoral Site Care The following information offers guidance on how to care for yourself after your procedure. Your health care provider may also give you more specific instructions. If you have problems or questions, contact  your health care provider. What can I expect after the procedure? After the procedure, it is common to have bruising and tenderness at the incision site. This usually fades within 1-2 weeks. Follow these instructions at home: Incision site care  Follow instructions from your health care provider about how to take care of your incision site. Make sure you: Wash your hands with soap and water for at least 20 seconds before and after you change your bandage (dressing). If soap and water are not available, use hand sanitizer. Change or remove your dressing as told by your health care provider. Leave stitches (sutures), skin glue, or adhesive strips in place. These skin closures may need to stay in place for 2 weeks or longer. If adhesive strip edges start to loosen and curl up, you may trim the loose edges. Do not remove adhesive strips completely unless your health care provider tells you to do that. Do not take baths, swim, or use a hot tub until your health care provider approves. You may shower 24-48 hours after the procedure or as told by your health care provider. Gently wash the incision site with plain soap and water. Pat the area dry with a clean towel. Do not rub the site. This may cause bleeding. Do not apply powder or lotion to the site. Keep the site clean and dry. Check your femoral site every day for signs of infection. Check for: Redness, swelling, or pain. Fluid or blood. Warmth. Pus or a bad smell. Activity If you were given a sedative during the procedure, it can affect you for several hours. Do not drive or operate machinery until your health care provider says that it is safe. Rest as told by your health care provider. Avoid sitting for a long time without moving. Get up to take short walks every 1-2 hours. This is important to improve blood flow and breathing. Ask for help if you feel weak or unsteady. Return to your normal activities as told by your health care provider.  Ask your health care provider what activities are safe for you and when you can return to work. Avoid activities that take a lot of effort for the first 2-3 days after your procedure, or as long as directed. Do not lift anything that is heavier than 10 lb (4.5 kg), or the limit that you are told, until your health care provider says that it is safe. General instructions Take over-the-counter and prescription medicines only as told by your health care provider. If you will be going home right after the procedure, plan to have a responsible adult care for you for  the time you are told. This is important. Keep all follow-up visits. This is important. Contact a health care provider if: You have a fever or chills. You have any of these signs of infection at your incision site: Redness, swelling, or pain. Fluid or blood. Warmth. Pus or a bad smell. Get help right away if: The incision area swells very fast. The incision area is bleeding, and the bleeding does not stop when you hold steady pressure on the area. Your leg or foot becomes pale, cool, tingly, or numb. These symptoms may represent a serious problem that is an emergency. Do not wait to see if the symptoms will go away. Get medical help right away. Call your local emergency services (911 in the [place] Do not drive yourself to the hospital. Summary After the procedure, it is common to have bruising and tenderness that fade within 1-2 weeks. Check your femoral site every day for signs of infection. Do not lift anything that is heavier than 10 lb (4.5 kg), or the limit that you are told, until your health care provider says that it is safe. Get help right away if the incision area swells very fast, you have bleeding at the incision area that does not stop, or your leg or foot becomes pale, cool, or numb. This information is not intended to replace advice given to you by your health care provider. Make sure you discuss any questions you have  with your health care provider. Document Revised: 05/22/2020 Document Reviewed: 05/22/2020 Elsevier Patient Education  [place].   Moderate Conscious Sedation, Adult, Care After This sheet gives you information about how to care for yourself after your procedure. Your health care provider may also give you more specific instructions. If you have problems or questions, contact your health care provider. What can I expect after the procedure? After the procedure, it is common to have: Sleepiness for several hours. Impaired judgment for several hours. Difficulty with balance. Vomiting if you eat too soon. Follow these instructions at home: For the time period you were told by your health care provider:   Rest. Do not participate in activities where you could fall or become injured. Do not drive or use machinery. Do not drink alcohol. Do not take sleeping pills or medicines that cause drowsiness. Do not make important decisions or sign legal documents. Do not take care of children on your own. Eating and drinking  Follow the diet recommended by your health care provider. Drink enough fluid to keep your urine pale yellow. If you vomit: Drink water, juice, or soup when you can drink without vomiting. Make sure you have little or no nausea before eating solid foods. General instructions Take over-the-counter and prescription medicines only as told by your health care provider. Have a responsible adult stay with you for the time you are told. It is important to have someone help care for you until you are awake and alert. Do not smoke. Keep all follow-up visits as told by your health care provider. This is important. Contact a health care provider if: You are still sleepy or having trouble with balance after 24 hours. You feel light-headed. You keep feeling nauseous or you keep vomiting. You develop a rash. You have a fever. You have redness or swelling around the IV  site. Get help right away if: You have trouble breathing. You have new-onset confusion at home. Summary After the procedure, it is common to feel sleepy, have impaired judgment, or feel nauseous  if you eat too soon. Rest after you get home. Know the things you should not do after the procedure. Follow the diet recommended by your health care provider and drink enough fluid to keep your urine pale yellow. Get help right away if you have trouble breathing or new-onset confusion at home. This information is not intended to replace advice given to you by your health care provider. Make sure you discuss any questions you have with your health care provider. Document Revised: 07/31/2019 Document Reviewed: 02/26/2019 Elsevier Patient Education  2022 [employer].

## 2021-02-16 ENCOUNTER — Inpatient Hospital Stay: Payer: BC Managed Care – PPO | Attending: Nurse Practitioner | Admitting: Genetic Counselor

## 2021-02-16 ENCOUNTER — Other Ambulatory Visit (HOSPITAL_COMMUNITY): Payer: Self-pay

## 2021-02-16 ENCOUNTER — Inpatient Hospital Stay: Payer: BC Managed Care – PPO

## 2021-02-16 ENCOUNTER — Other Ambulatory Visit: Payer: Self-pay | Admitting: Nurse Practitioner

## 2021-02-16 DIAGNOSIS — R160 Hepatomegaly, not elsewhere classified: Secondary | ICD-10-CM

## 2021-02-16 DIAGNOSIS — C61 Malignant neoplasm of prostate: Secondary | ICD-10-CM

## 2021-02-16 MED ORDER — OXYCODONE-ACETAMINOPHEN 5-325 MG PO TABS: 1.0000 | ORAL_TABLET | ORAL | 0 refills | Status: DC | PRN

## 2021-02-17 ENCOUNTER — Telehealth: Payer: Self-pay | Admitting: Nurse Practitioner

## 2021-02-17 ENCOUNTER — Other Ambulatory Visit (HOSPITAL_COMMUNITY): Payer: Self-pay

## 2021-02-17 NOTE — Telephone Encounter (Signed)
I attempted to contact [name] to discuss the rationale for the genetics referral.  His son answered the phone.  [name] is not available.  He will have [name] call us when he is able to talk.

## 2021-02-24 ENCOUNTER — Inpatient Hospital Stay: Payer: BC Managed Care – PPO

## 2021-02-24 ENCOUNTER — Inpatient Hospital Stay: Payer: BC Managed Care – PPO | Admitting: Oncology

## 2021-02-28 ENCOUNTER — Other Ambulatory Visit (HOSPITAL_COMMUNITY): Payer: Self-pay

## 2021-03-01 ENCOUNTER — Other Ambulatory Visit: Payer: Self-pay | Admitting: Radiology

## 2021-03-02 ENCOUNTER — Inpatient Hospital Stay (HOSPITAL_COMMUNITY): Admission: RE | Admit: 2021-03-02 | Payer: BC Managed Care – PPO | Source: Ambulatory Visit

## 2021-03-02 ENCOUNTER — Telehealth: Payer: Self-pay | Admitting: *Deleted

## 2021-03-02 ENCOUNTER — Encounter (HOSPITAL_COMMUNITY): Payer: BC Managed Care – PPO

## 2021-03-02 NOTE — Telephone Encounter (Signed)
Patient unable to have liver embolization due to not being available. Called and left message for significant other to call back to inform when he anticipates being available to continue his healthcare. Provided RN coordinator's direct phone number.

## 2021-03-07 ENCOUNTER — Telehealth: Payer: Self-pay | Admitting: *Deleted

## 2021-03-07 ENCOUNTER — Other Ambulatory Visit: Payer: Self-pay | Admitting: Oncology

## 2021-03-07 NOTE — Telephone Encounter (Signed)
Called his room mate, Marlowe Kays and confirmed he is still incarcerated and she is unsure of when he will be released. Received refill request for his tamsulosin and allopurinol today. Confirmed w/[facility] it was an automatic refill request. Asked Marlowe Kays to please have him call when he is released.

## 2021-03-08 ENCOUNTER — Encounter: Payer: Self-pay | Admitting: *Deleted

## 2021-03-08 NOTE — Progress Notes (Signed)
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 Was able to obtain the name and phone # of his probation officer and left VM requesting potential release date and location and contact number of medical team at his current location to discuss his continued medical care.

## 2021-03-10 NOTE — Progress Notes (Incomplete)
CARDIOLOGY CONSULT NOTE       Patient ID: CICERO NOY MRN: [id number] DOB/AGE: [birth year] 55 y.o.  Admit date: (Not on file) Referring Physician: Marijean Bravo [facility] Primary Physician: Patient, No Pcp Per (Inactive) Primary Cardiologist: Johnsie Cancel Reason for Consultation: CAD  Active Problems:   * No active hospital problems. *   HPI:  55 y.o. first seen for chest pain in ER 12/19/20 History of metastatic prostate cancer On baseline narcotics for bone mets including epidural ventral mets in sacram and C spine. Also ? New liver malignancy Cath done 12/21/20 99% proximal LAD that was stented Came back the following day for stenting of the proximal and mid RCA TTE showed EF 50-55%  01/06/21- Long discussion with him about med compliance Has not been taking ASA/Brilinta regularly And is at risk for stent thrombosis No chest pain Still drinking beer daily   Has been in jail and missed his oncology appointments and Rx for liver mets   ***  ROS All other systems reviewed and negative except as noted above  Past Medical History:  Diagnosis Date   Alcohol abuse    Cocaine abuse ([place])    Depression    Gout    Hep C w/o coma, chronic ([place]) diagnosed May 2016    Family History  Problem Relation Age of Onset   Cancer Mother    Cancer Father     Social History   Socioeconomic History   Marital status: Single    Spouse name: Not on file   Number of children: Not on file   Years of education: Not on file   Highest education level: Not on file  Occupational History   Not on file  Tobacco Use   Smoking status: Every Day    Packs/day: 1.00    Years: 25.00    Pack years: 25.00    Types: Cigarettes   Smokeless tobacco: Never  Substance and Sexual Activity   Alcohol use: Not Currently    Alcohol/week: 126.0 standard drinks    Types: 126 Cans of beer per week    Comment: 18 pack per day   Drug use: Yes    Types: Cocaine, IV, Heroin, Marijuana    Comment: Used crack and  cocaine 04/02/16   Sexual activity: Not Currently  Other Topics Concern   Not on file  Social History Narrative   Not on file   Social Determinants of Health   Financial Resource Strain: High Risk   Difficulty of Paying Living Expenses: Hard  Food Insecurity: Food Insecurity Present   Worried About Running Out of Food in the Last Year: Sometimes true   Ran Out of Food in the Last Year: Never true  Transportation Needs: No Transportation Needs   Lack of Transportation (Medical): No   Lack of Transportation (Non-Medical): No  Physical Activity: Not on file  Stress: Not on file  Social Connections: Moderately Isolated   Frequency of Communication with Friends and Family: Once a week   Frequency of Social Gatherings with Friends and Family: Once a week   Attends Religious Services: 1 to 4 times per year   Active Member of [employer] or Organizations: No   Attends Music therapist: 1 to 4 times per year   Marital Status: Separated  Intimate Partner Violence: Not At Risk   Fear of Current or Ex-Partner: No   Emotionally Abused: No   Physically Abused: No   Sexually Abused: No    Past Surgical History:  Procedure  Laterality Date   CORONARY STENT INTERVENTION N/A 12/21/2020   Procedure: CORONARY STENT INTERVENTION;  Surgeon: Troy Sine, MD;  Location: [place] [facility];  Service: Cardiovascular;  Laterality: N/A;   CORONARY STENT INTERVENTION N/A 12/22/2020   Procedure: CORONARY STENT INTERVENTION;  Surgeon: Burnell Blanks, MD;  Location: [place] [facility];  Service: Cardiovascular;  Laterality: N/A;   IR 3D INDEPENDENT WKST  02/15/2021   IR ANGIOGRAM SELECTIVE EACH ADDITIONAL VESSEL  02/15/2021   IR ANGIOGRAM SELECTIVE EACH ADDITIONAL VESSEL  02/15/2021   IR ANGIOGRAM VISCERAL SELECTIVE  02/15/2021   IR ANGIOGRAM VISCERAL SELECTIVE  02/15/2021   IR RADIOLOGIST EVAL & MGMT  01/11/2021   IR US GUIDE VASC ACCESS RIGHT  02/15/2021   LEFT  HEART CATH AND CORONARY ANGIOGRAPHY N/A 12/21/2020   Procedure: LEFT HEART CATH AND CORONARY ANGIOGRAPHY;  Surgeon: Troy Sine, MD;  Location: [place] [facility];  Service: Cardiovascular;  Laterality: N/A;      Current Outpatient Medications:    abiraterone acetate (ZYTIGA) 250 MG tablet, TAKE 4 TABLETS (1,000 MG TOTAL) BY MOUTH DAILY. TAKE ON AN EMPTY STOMACH 1 HOUR BEFORE OR 2 HOURS AFTER A MEAL, Disp: 120 tablet, Rfl: 0   allopurinol (ZYLOPRIM) 100 MG tablet, TAKE 1 TABLET BY MOUTH EVERY DAY START ON 12/24, Disp: 90 tablet, Rfl: 1   amLODipine (NORVASC) 5 MG tablet, Take 1 tablet (5 mg total) by mouth daily., Disp: 90 tablet, Rfl: 3   aspirin 81 MG chewable tablet, Chew 1 tablet (81 mg total) by mouth daily., Disp: 90 tablet, Rfl: 3   ferrous gluconate (FERGON) 324 MG tablet, Take 1 tablet (324 mg total) by mouth 2 (two) times daily with a meal., Disp: 30 tablet, Rfl: 3   indomethacin (INDOCIN) 50 MG capsule, Take 1 capsule (50 mg total) by mouth 3 (three) times daily with meals. For gout flare, Disp: 15 capsule, Rfl: 0   nitroGLYCERIN (NITROSTAT) 0.4 MG SL tablet, Place 1 tablet (0.4 mg total) under the tongue every 5 (five) minutes as needed for chest pain., Disp: 25 tablet, Rfl: 12   ondansetron (ZOFRAN) 4 MG tablet, Take 1 tablet (4 mg total) by mouth every 6 (six) hours as needed for nausea., Disp: 20 tablet, Rfl: 0   oxyCODONE-acetaminophen (PERCOCET/ROXICET) 5-325 MG tablet, Take 1 tablet by mouth every 4 (four) hours as needed for severe pain., Disp: 60 tablet, Rfl: 0   predniSONE (DELTASONE) 5 MG tablet, TAKE 1 TABLET (5 MG TOTAL) BY MOUTH DAILY WITH BREAKFAST. (Patient taking differently: Take 5 mg by mouth daily with breakfast.), Disp: 30 tablet, Rfl: 5   rosuvastatin (CRESTOR) 20 MG tablet, Take 1 tablet (20 mg total) by mouth daily., Disp: 90 tablet, Rfl: 0   tamsulosin (FLOMAX) 0.4 MG CAPS capsule, TAKE 1 CAPSULE BY MOUTH EVERY DAY AFTER SUPPER (Patient taking  differently: Take 0.4 mg by mouth daily.), Disp: 90 capsule, Rfl: 1   ticagrelor (BRILINTA) 90 MG TABS tablet, Take 1 tablet (90 mg total) by mouth 2 (two) times daily., Disp: 120 tablet, Rfl: 3    Physical Exam: There were no vitals taken for this visit.    Affect appropriate Chronically ill male  HEENT: normal Neck supple with no adenopathy JVP normal no bruits no thyromegaly Lungs clear with no wheezing and good diaphragmatic motion Heart:  S1/S2 no murmur, no rub, gallop or click PMI normal Abdomen: benighn, BS positve, no tenderness, no AAA no bruit.  No HSM or HJR Distal  pulses intact with no bruits No edema Neuro non-focal Skin warm and dry No muscular weakness   Labs:   Lab Results  Component Value Date   WBC 8.2 02/15/2021   HGB 13.5 02/15/2021   HCT 39.1 02/15/2021   MCV 90.5 02/15/2021   PLT 229 02/15/2021   No results for input(s): NA, K, CL, CO2, BUN, CREATININE, CALCIUM, PROT, BILITOT, ALKPHOS, ALT, AST, GLUCOSE in the last 168 hours.  Invalid input(s): LABALBU No results found for: CKTOTAL, CKMB, CKMBINDEX, TROPONINI  Lab Results  Component Value Date   CHOL 201 (H) 12/19/2020   CHOL 196 12/19/2017   Lab Results  Component Value Date   HDL 36 (L) 12/19/2020   HDL 29 (L) 12/19/2017   Lab Results  Component Value Date   LDLCALC 115 (H) 12/19/2020   LDLCALC Comment 12/19/2017   Lab Results  Component Value Date   TRIG 248 (H) 12/19/2020   TRIG 251 (H) 04/01/2020   TRIG 409 (H) 12/19/2017   Lab Results  Component Value Date   CHOLHDL 5.6 12/19/2020   CHOLHDL 6.8 (H) 12/19/2017   Lab Results  Component Value Date   LDLDIRECT 94 02/27/2018      Radiology: NM LIVER TUMOR LOC IMFLAM SPECT 1 DAY  Result Date: 02/15/2021 CLINICAL DATA:  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 Pre yttrium 90 radio embolization vascular mapping. EXAM: NUCLEAR MEDICINE LIVER SCAN TECHNIQUE: Abdominal images were obtained in multiple projections after  intrahepatic arterial injection of radiopharmaceutical. SPECT imaging was performed. Lung shunt calculation was performed. RADIOPHARMACEUTICALS:  4.30millicurie MAA TECHNETIUM TO 19M ALBUMIN AGGREGATED COMPARISON:  MRI 12/21/2020 FINDINGS: The injected microaggregated albumin localizes within the liver RIGHT hepatic lobe. No evidence of activity within the stomach, duodenum, or bowel. Calculated shunt fraction to the lungs equals 12.2%. IMPRESSION: 1. No significant extrahepatic radiotracer activity following intrahepatic arterial injection of MAA. 2. Lung shunt fraction equals 12.2% Electronically Signed   By: Suzy Bouchard M.D.   On: 02/15/2021 13:20   IR Angiogram Visceral Selective  Result Date: 02/15/2021 INDICATION: 55 year old male with history of hepatitis C and alcoholic cirrhosis, metastatic prostate cancer, and large right hepatic mass compatible with hepatocellular carcinoma given elevated serum alpha fetoprotein. EXAM: 1. Ultrasound-guided access of the right common femoral artery 2. Catheterization and angiography of the celiac, right, and right posterior hepatic arteries 3. Cone beam CT 4. MAA injection into the right posterior hepatic artery MEDICATIONS: None. ANESTHESIA/SEDATION: Moderate (conscious) sedation was employed during this procedure. A total of Versed 2 mg and Fentanyl 100 mcg was administered intravenously. Moderate Sedation Time: 48 minutes. The patient's level of consciousness and vital signs were monitored continuously by radiology nursing throughout the procedure under my direct supervision. CONTRAST:  161mL OMNIPAQUE IOHEXOL 300 MG/ML SOLN, 41mL OMNIPAQUE IOHEXOL 300 MG/ML SOLN FLUOROSCOPY TIME:  Fluoroscopy Time: 5 minutes 24 seconds (345 mGy). COMPLICATIONS: None immediate. PROCEDURE: Informed consent was obtained from the patient following explanation of the procedure, risks, benefits and alternatives. The patient understands, agrees and consents for the procedure. All  questions were addressed. A time out was performed prior to the initiation of the procedure. Maximal barrier sterile technique utilized including caps, mask, sterile gowns, sterile gloves, large sterile drape, hand hygiene, and Betadine prep. A preliminary ultrasound of the right groin was performed and demonstrates a patent right common femoral artery. A permanent ultrasound image was recorded. Using a combination of fluoroscopy and ultrasound, an access site was determined. The overlying skin was anesthetized with  1% Lidocaine. Using ultrasound guidance, access into the right common femoral artery was obtained with visualization of needle entry into the vessel using standard micropuncture technique. Limited angiogram of the common femoral artery demonstrates appropriate site for a closure device. A 5 French sheath was placed. A C2 catheter was advanced into the celiac artery. Limited celiac angiography demonstrates conventional anatomy. The catheter was then used to select the proper hepatic artery and angiography was performed, demonstrating a hypervascular mass in the right lobe, consistent with hepatocellular carcinoma. A cone beam CT was then performed which demonstrated hypervascular mass with vascular supply exclusively from the right posterior hepatic artery. A Progreat Omega microcatheter and fathom 16 microwire were used to select the right posterior hepatic artery. Angiography demonstrates supply to the targeted mass. There is no evidence of non-target extra-hepatic branches. Cone beam CT was performed, demonstrating arterial supply to the large mass with a few small satellite lesions in segment 6. There is no evidence of extrahepatic arterial branches. Per protocol, MAA was then infused into the right posterior hepatic artery. All wires and catheters were removed. Hemostasis was achieved using an Angio-Seal device. There were no immediate complications. Peripheral pulses were unchanged. The patient was  transferred to the recovery area in stable condition. IMPRESSION: 1. Visceral angiography demonstrating a hypervascular mass in the right hepatic lobe, consistent with hepatocellular carcinoma. 2. MAA infusion into the right posterior hepatic artery. 3. We will follow up shunt fraction calculations and calculate liver volumes in preparation for radioembolization therapy. Ruthann Cancer, MD [facility] Radiology Electronically Signed   By: Ruthann Cancer M.D.   On: 02/15/2021 13:48   IR Angiogram Visceral Selective  Result Date: 02/15/2021 INDICATION: 55 year old male with history of hepatitis C and alcoholic cirrhosis, metastatic prostate cancer, and large right hepatic mass compatible with hepatocellular carcinoma given elevated serum alpha fetoprotein. EXAM: 1. Ultrasound-guided access of the right common femoral artery 2. Catheterization and angiography of the celiac, right, and right posterior hepatic arteries 3. Cone beam CT 4. MAA injection into the right posterior hepatic artery MEDICATIONS: None. ANESTHESIA/SEDATION: Moderate (conscious) sedation was employed during this procedure. A total of Versed 2 mg and Fentanyl 100 mcg was administered intravenously. Moderate Sedation Time: 48 minutes. The patient's level of consciousness and vital signs were monitored continuously by radiology nursing throughout the procedure under my direct supervision. CONTRAST:  123mL OMNIPAQUE IOHEXOL 300 MG/ML SOLN, 60mL OMNIPAQUE IOHEXOL 300 MG/ML SOLN FLUOROSCOPY TIME:  Fluoroscopy Time: 5 minutes 24 seconds (345 mGy). COMPLICATIONS: None immediate. PROCEDURE: Informed consent was obtained from the patient following explanation of the procedure, risks, benefits and alternatives. The patient understands, agrees and consents for the procedure. All questions were addressed. A time out was performed prior to the initiation of the procedure. Maximal barrier sterile technique utilized  including caps, mask, sterile gowns, sterile gloves, large sterile drape, hand hygiene, and Betadine prep. A preliminary ultrasound of the right groin was performed and demonstrates a patent right common femoral artery. A permanent ultrasound image was recorded. Using a combination of fluoroscopy and ultrasound, an access site was determined. The overlying skin was anesthetized with 1% Lidocaine. Using ultrasound guidance, access into the right common femoral artery was obtained with visualization of needle entry into the vessel using standard micropuncture technique. Limited angiogram of the common femoral artery demonstrates appropriate site for a closure device. A 5 French sheath was placed. A C2 catheter was advanced into the celiac artery. Limited celiac angiography demonstrates conventional anatomy.  The catheter was then used to select the proper hepatic artery and angiography was performed, demonstrating a hypervascular mass in the right lobe, consistent with hepatocellular carcinoma. A cone beam CT was then performed which demonstrated hypervascular mass with vascular supply exclusively from the right posterior hepatic artery. A Progreat Omega microcatheter and fathom 16 microwire were used to select the right posterior hepatic artery. Angiography demonstrates supply to the targeted mass. There is no evidence of non-target extra-hepatic branches. Cone beam CT was performed, demonstrating arterial supply to the large mass with a few small satellite lesions in segment 6. There is no evidence of extrahepatic arterial branches. Per protocol, MAA was then infused into the right posterior hepatic artery. All wires and catheters were removed. Hemostasis was achieved using an Angio-Seal device. There were no immediate complications. Peripheral pulses were unchanged. The patient was transferred to the recovery area in stable condition. IMPRESSION: 1. Visceral angiography demonstrating a hypervascular mass in the  right hepatic lobe, consistent with hepatocellular carcinoma. 2. MAA infusion into the right posterior hepatic artery. 3. We will follow up shunt fraction calculations and calculate liver volumes in preparation for radioembolization therapy. Ruthann Cancer, MD [facility] Radiology Electronically Signed   By: Ruthann Cancer M.D.   On: 02/15/2021 11:17   IR Angiogram Selective Each Additional Vessel  Result Date: 02/15/2021 INDICATION: 55 year old male with history of hepatitis C and alcoholic cirrhosis, metastatic prostate cancer, and large right hepatic mass compatible with hepatocellular carcinoma given elevated serum alpha fetoprotein. EXAM: 1. Ultrasound-guided access of the right common femoral artery 2. Catheterization and angiography of the celiac, right, and right posterior hepatic arteries 3. Cone beam CT 4. MAA injection into the right posterior hepatic artery MEDICATIONS: None. ANESTHESIA/SEDATION: Moderate (conscious) sedation was employed during this procedure. A total of Versed 2 mg and Fentanyl 100 mcg was administered intravenously. Moderate Sedation Time: 48 minutes. The patient's level of consciousness and vital signs were monitored continuously by radiology nursing throughout the procedure under my direct supervision. CONTRAST:  167mL OMNIPAQUE IOHEXOL 300 MG/ML SOLN, 49mL OMNIPAQUE IOHEXOL 300 MG/ML SOLN FLUOROSCOPY TIME:  Fluoroscopy Time: 5 minutes 24 seconds (345 mGy). COMPLICATIONS: None immediate. PROCEDURE: Informed consent was obtained from the patient following explanation of the procedure, risks, benefits and alternatives. The patient understands, agrees and consents for the procedure. All questions were addressed. A time out was performed prior to the initiation of the procedure. Maximal barrier sterile technique utilized including caps, mask, sterile gowns, sterile gloves, large sterile drape, hand hygiene, and Betadine prep. A  preliminary ultrasound of the right groin was performed and demonstrates a patent right common femoral artery. A permanent ultrasound image was recorded. Using a combination of fluoroscopy and ultrasound, an access site was determined. The overlying skin was anesthetized with 1% Lidocaine. Using ultrasound guidance, access into the right common femoral artery was obtained with visualization of needle entry into the vessel using standard micropuncture technique. Limited angiogram of the common femoral artery demonstrates appropriate site for a closure device. A 5 French sheath was placed. A C2 catheter was advanced into the celiac artery. Limited celiac angiography demonstrates conventional anatomy. The catheter was then used to select the proper hepatic artery and angiography was performed, demonstrating a hypervascular mass in the right lobe, consistent with hepatocellular carcinoma. A cone beam CT was then performed which demonstrated hypervascular mass with vascular supply exclusively from the right posterior hepatic artery. A Progreat Omega microcatheter and fathom 16 microwire were used to  select the right posterior hepatic artery. Angiography demonstrates supply to the targeted mass. There is no evidence of non-target extra-hepatic branches. Cone beam CT was performed, demonstrating arterial supply to the large mass with a few small satellite lesions in segment 6. There is no evidence of extrahepatic arterial branches. Per protocol, MAA was then infused into the right posterior hepatic artery. All wires and catheters were removed. Hemostasis was achieved using an Angio-Seal device. There were no immediate complications. Peripheral pulses were unchanged. The patient was transferred to the recovery area in stable condition. IMPRESSION: 1. Visceral angiography demonstrating a hypervascular mass in the right hepatic lobe, consistent with hepatocellular carcinoma. 2. MAA infusion into the right posterior hepatic  artery. 3. We will follow up shunt fraction calculations and calculate liver volumes in preparation for radioembolization therapy. Ruthann Cancer, MD [facility] Radiology Electronically Signed   By: Ruthann Cancer M.D.   On: 02/15/2021 11:17   IR Angiogram Selective Each Additional Vessel  Result Date: 02/15/2021 INDICATION: 54 year old male with history of hepatitis C and alcoholic cirrhosis, metastatic prostate cancer, and large right hepatic mass compatible with hepatocellular carcinoma given elevated serum alpha fetoprotein. EXAM: 1. Ultrasound-guided access of the right common femoral artery 2. Catheterization and angiography of the celiac, right, and right posterior hepatic arteries 3. Cone beam CT 4. MAA injection into the right posterior hepatic artery MEDICATIONS: None. ANESTHESIA/SEDATION: Moderate (conscious) sedation was employed during this procedure. A total of Versed 2 mg and Fentanyl 100 mcg was administered intravenously. Moderate Sedation Time: 48 minutes. The patient's level of consciousness and vital signs were monitored continuously by radiology nursing throughout the procedure under my direct supervision. CONTRAST:  166mL OMNIPAQUE IOHEXOL 300 MG/ML SOLN, 75mL OMNIPAQUE IOHEXOL 300 MG/ML SOLN FLUOROSCOPY TIME:  Fluoroscopy Time: 5 minutes 24 seconds (345 mGy). COMPLICATIONS: None immediate. PROCEDURE: Informed consent was obtained from the patient following explanation of the procedure, risks, benefits and alternatives. The patient understands, agrees and consents for the procedure. All questions were addressed. A time out was performed prior to the initiation of the procedure. Maximal barrier sterile technique utilized including caps, mask, sterile gowns, sterile gloves, large sterile drape, hand hygiene, and Betadine prep. A preliminary ultrasound of the right groin was performed and demonstrates a patent right common femoral artery. A  permanent ultrasound image was recorded. Using a combination of fluoroscopy and ultrasound, an access site was determined. The overlying skin was anesthetized with 1% Lidocaine. Using ultrasound guidance, access into the right common femoral artery was obtained with visualization of needle entry into the vessel using standard micropuncture technique. Limited angiogram of the common femoral artery demonstrates appropriate site for a closure device. A 5 French sheath was placed. A C2 catheter was advanced into the celiac artery. Limited celiac angiography demonstrates conventional anatomy. The catheter was then used to select the proper hepatic artery and angiography was performed, demonstrating a hypervascular mass in the right lobe, consistent with hepatocellular carcinoma. A cone beam CT was then performed which demonstrated hypervascular mass with vascular supply exclusively from the right posterior hepatic artery. A Progreat Omega microcatheter and fathom 16 microwire were used to select the right posterior hepatic artery. Angiography demonstrates supply to the targeted mass. There is no evidence of non-target extra-hepatic branches. Cone beam CT was performed, demonstrating arterial supply to the large mass with a few small satellite lesions in segment 6. There is no evidence of extrahepatic arterial branches. Per protocol, MAA was then infused into the right  posterior hepatic artery. All wires and catheters were removed. Hemostasis was achieved using an Angio-Seal device. There were no immediate complications. Peripheral pulses were unchanged. The patient was transferred to the recovery area in stable condition. IMPRESSION: 1. Visceral angiography demonstrating a hypervascular mass in the right hepatic lobe, consistent with hepatocellular carcinoma. 2. MAA infusion into the right posterior hepatic artery. 3. We will follow up shunt fraction calculations and calculate liver volumes in preparation for  radioembolization therapy. Ruthann Cancer, MD [facility] Electronically Signed   By: Ruthann Cancer M.D.   On: 02/15/2021 11:17   IR 3D Independent Darreld Mclean  Result Date: 02/15/2021 INDICATION: 55 year old male with history of hepatitis C and alcoholic cirrhosis, metastatic prostate cancer, and large right hepatic mass compatible with hepatocellular carcinoma given elevated serum alpha fetoprotein. EXAM: 1. Ultrasound-guided access of the right common femoral artery 2. Catheterization and angiography of the celiac, right, and right posterior hepatic arteries 3. Cone beam CT 4. MAA injection into the right posterior hepatic artery MEDICATIONS: None. ANESTHESIA/SEDATION: Moderate (conscious) sedation was employed during this procedure. A total of Versed 2 mg and Fentanyl 100 mcg was administered intravenously. Moderate Sedation Time: 48 minutes. The patient's level of consciousness and vital signs were monitored continuously by radiology nursing throughout the procedure under my direct supervision. CONTRAST:  182mL OMNIPAQUE IOHEXOL 300 MG/ML SOLN, 6mL OMNIPAQUE IOHEXOL 300 MG/ML SOLN FLUOROSCOPY TIME:  Fluoroscopy Time: 5 minutes 24 seconds (345 mGy). COMPLICATIONS: None immediate. PROCEDURE: Informed consent was obtained from the patient following explanation of the procedure, risks, benefits and alternatives. The patient understands, agrees and consents for the procedure. All questions were addressed. A time out was performed prior to the initiation of the procedure. Maximal barrier sterile technique utilized including caps, mask, sterile gowns, sterile gloves, large sterile drape, hand hygiene, and Betadine prep. A preliminary ultrasound of the right groin was performed and demonstrates a patent right common femoral artery. A permanent ultrasound image was recorded. Using a combination of fluoroscopy and ultrasound, an access site was determined. The  overlying skin was anesthetized with 1% Lidocaine. Using ultrasound guidance, access into the right common femoral artery was obtained with visualization of needle entry into the vessel using standard micropuncture technique. Limited angiogram of the common femoral artery demonstrates appropriate site for a closure device. A 5 French sheath was placed. A C2 catheter was advanced into the celiac artery. Limited celiac angiography demonstrates conventional anatomy. The catheter was then used to select the proper hepatic artery and angiography was performed, demonstrating a hypervascular mass in the right lobe, consistent with hepatocellular carcinoma. A cone beam CT was then performed which demonstrated hypervascular mass with vascular supply exclusively from the right posterior hepatic artery. A Progreat Omega microcatheter and fathom 16 microwire were used to select the right posterior hepatic artery. Angiography demonstrates supply to the targeted mass. There is no evidence of non-target extra-hepatic branches. Cone beam CT was performed, demonstrating arterial supply to the large mass with a few small satellite lesions in segment 6. There is no evidence of extrahepatic arterial branches. Per protocol, MAA was then infused into the right posterior hepatic artery. All wires and catheters were removed. Hemostasis was achieved using an Angio-Seal device. There were no immediate complications. Peripheral pulses were unchanged. The patient was transferred to the recovery area in stable condition. IMPRESSION: 1. Visceral angiography demonstrating a hypervascular mass in the right hepatic lobe, consistent with hepatocellular carcinoma. 2. MAA infusion into the right posterior hepatic  artery. 3. We will follow up shunt fraction calculations and calculate liver volumes in preparation for radioembolization therapy. Ruthann Cancer, MD [facility] Radiology Electronically Signed    By: Ruthann Cancer M.D.   On: 02/15/2021 11:17   IR US Guide Vasc Access Right  Result Date: 02/15/2021 INDICATION: 55 year old male with history of hepatitis C and alcoholic cirrhosis, metastatic prostate cancer, and large right hepatic mass compatible with hepatocellular carcinoma given elevated serum alpha fetoprotein. EXAM: 1. Ultrasound-guided access of the right common femoral artery 2. Catheterization and angiography of the celiac, right, and right posterior hepatic arteries 3. Cone beam CT 4. MAA injection into the right posterior hepatic artery MEDICATIONS: None. ANESTHESIA/SEDATION: Moderate (conscious) sedation was employed during this procedure. A total of Versed 2 mg and Fentanyl 100 mcg was administered intravenously. Moderate Sedation Time: 48 minutes. The patient's level of consciousness and vital signs were monitored continuously by radiology nursing throughout the procedure under my direct supervision. CONTRAST:  16mL OMNIPAQUE IOHEXOL 300 MG/ML SOLN, 77mL OMNIPAQUE IOHEXOL 300 MG/ML SOLN FLUOROSCOPY TIME:  Fluoroscopy Time: 5 minutes 24 seconds (345 mGy). COMPLICATIONS: None immediate. PROCEDURE: Informed consent was obtained from the patient following explanation of the procedure, risks, benefits and alternatives. The patient understands, agrees and consents for the procedure. All questions were addressed. A time out was performed prior to the initiation of the procedure. Maximal barrier sterile technique utilized including caps, mask, sterile gowns, sterile gloves, large sterile drape, hand hygiene, and Betadine prep. A preliminary ultrasound of the right groin was performed and demonstrates a patent right common femoral artery. A permanent ultrasound image was recorded. Using a combination of fluoroscopy and ultrasound, an access site was determined. The overlying skin was anesthetized with 1% Lidocaine. Using ultrasound guidance, access into the right common femoral artery was obtained  with visualization of needle entry into the vessel using standard micropuncture technique. Limited angiogram of the common femoral artery demonstrates appropriate site for a closure device. A 5 French sheath was placed. A C2 catheter was advanced into the celiac artery. Limited celiac angiography demonstrates conventional anatomy. The catheter was then used to select the proper hepatic artery and angiography was performed, demonstrating a hypervascular mass in the right lobe, consistent with hepatocellular carcinoma. A cone beam CT was then performed which demonstrated hypervascular mass with vascular supply exclusively from the right posterior hepatic artery. A Progreat Omega microcatheter and fathom 16 microwire were used to select the right posterior hepatic artery. Angiography demonstrates supply to the targeted mass. There is no evidence of non-target extra-hepatic branches. Cone beam CT was performed, demonstrating arterial supply to the large mass with a few small satellite lesions in segment 6. There is no evidence of extrahepatic arterial branches. Per protocol, MAA was then infused into the right posterior hepatic artery. All wires and catheters were removed. Hemostasis was achieved using an Angio-Seal device. There were no immediate complications. Peripheral pulses were unchanged. The patient was transferred to the recovery area in stable condition. IMPRESSION: 1. Visceral angiography demonstrating a hypervascular mass in the right hepatic lobe, consistent with hepatocellular carcinoma. 2. MAA infusion into the right posterior hepatic artery. 3. We will follow up shunt fraction calculations and calculate liver volumes in preparation for radioembolization therapy. Ruthann Cancer, MD [facility] Radiology Electronically Signed   By: Ruthann Cancer M.D.   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 Pre yttrium 90 radio embolization vascular mapping. EXAM: NUCLEAR MEDICINE LIVER SCAN TECHNIQUE: Abdominal images were obtained in multiple projections after intrahepatic arterial injection of radiopharmaceutical. SPECT imaging was performed. Lung shunt calculation was performed. RADIOPHARMACEUTICALS: 4.26millicurie MAA TECHNETIUM TO 16M ALBUMIN AGGREGATED COMPARISON: MRI 12/21/2020 FINDINGS: The injected microaggregated albumin localizes within the liver RIGHT hepatic lobe. No evidence of activity within the stomach, duodenum, or bowel. Calculated shunt fraction to the lungs equals 12.2%. IMPRESSION: 1. No significant extrahepatic radiotracer activity following intrahepatic arterial injection of MAA. 2. Lung shunt fraction equals 12.2% Electronically Signed   By: Suzy Bouchard M.D.   On: 02/16/2021 08:16    EKG: NSR no acute changes    ASSESSMENT AND PLAN:   CAD:  9/5 post stenting proximal LAD And staged stenting of proximal and mid RCA EF 50-55% continue DAT No beta blocker as patient had cocaine in blood stream on admission *** HLD:  continue crestor  HTN:  Well controlled.  Continue current medications and low sodium Dash type diet.   Cancer:  metastatic prostate and hepatocellular f/u Dr Annitta Jersey Had been contemplating Y 90 hepatic radioembolization with Dr Serafina Royals    F/U 6 months   Signed: Jenkins Rouge 03/10/2021, 1:42 PM

## 2021-03-14 ENCOUNTER — Telehealth: Payer: Self-pay | Admitting: *Deleted

## 2021-03-14 NOTE — Telephone Encounter (Signed)
Left VM for Edgar Mooney #[phone], parole officer to call back w/potential release date or how to arrange for oncology care while incarcerated.

## 2021-03-17 ENCOUNTER — Ambulatory Visit: Payer: BC Managed Care – PPO | Admitting: Cardiovascular Disease

## 2021-03-21 ENCOUNTER — Telehealth: Payer: Self-pay | Admitting: *Deleted

## 2021-03-21 NOTE — Telephone Encounter (Signed)
Call from detention officer to schedule his follow up to get his treatment started again. Scheduled for 04/04/21 at 1100/1120. Scheduling message sent.

## 2021-04-04 ENCOUNTER — Encounter: Payer: Self-pay | Admitting: *Deleted

## 2021-04-04 ENCOUNTER — Other Ambulatory Visit: Payer: Self-pay

## 2021-04-04 ENCOUNTER — Encounter: Payer: Self-pay | Admitting: Genetic Counselor

## 2021-04-04 ENCOUNTER — Inpatient Hospital Stay: Payer: BC Managed Care – PPO | Attending: Nurse Practitioner | Admitting: Oncology

## 2021-04-04 ENCOUNTER — Other Ambulatory Visit (HOSPITAL_COMMUNITY): Payer: Self-pay

## 2021-04-04 ENCOUNTER — Inpatient Hospital Stay: Payer: BC Managed Care – PPO

## 2021-04-04 VITALS — BP 117/75 | HR 71 | Temp 98.1°F | Resp 20 | Ht 62.0 in | Wt 139.0 lb

## 2021-04-04 DIAGNOSIS — D696 Thrombocytopenia, unspecified: Secondary | ICD-10-CM | POA: Diagnosis not present

## 2021-04-04 DIAGNOSIS — Z1509 Genetic susceptibility to other malignant neoplasm: Secondary | ICD-10-CM

## 2021-04-04 DIAGNOSIS — Z8619 Personal history of other infectious and parasitic diseases: Secondary | ICD-10-CM | POA: Insufficient documentation

## 2021-04-04 DIAGNOSIS — I252 Old myocardial infarction: Secondary | ICD-10-CM | POA: Insufficient documentation

## 2021-04-04 DIAGNOSIS — C220 Liver cell carcinoma: Secondary | ICD-10-CM | POA: Diagnosis not present

## 2021-04-04 DIAGNOSIS — F1911 Other psychoactive substance abuse, in remission: Secondary | ICD-10-CM | POA: Insufficient documentation

## 2021-04-04 DIAGNOSIS — Z1501 Genetic susceptibility to malignant neoplasm of breast: Secondary | ICD-10-CM | POA: Insufficient documentation

## 2021-04-04 DIAGNOSIS — C7951 Secondary malignant neoplasm of bone: Secondary | ICD-10-CM | POA: Insufficient documentation

## 2021-04-04 DIAGNOSIS — F17200 Nicotine dependence, unspecified, uncomplicated: Secondary | ICD-10-CM | POA: Diagnosis not present

## 2021-04-04 DIAGNOSIS — R161 Splenomegaly, not elsewhere classified: Secondary | ICD-10-CM | POA: Diagnosis not present

## 2021-04-04 DIAGNOSIS — F32A Depression, unspecified: Secondary | ICD-10-CM | POA: Diagnosis not present

## 2021-04-04 DIAGNOSIS — Z1589 Genetic susceptibility to other disease: Secondary | ICD-10-CM

## 2021-04-04 DIAGNOSIS — C61 Malignant neoplasm of prostate: Secondary | ICD-10-CM | POA: Insufficient documentation

## 2021-04-04 DIAGNOSIS — K7460 Unspecified cirrhosis of liver: Secondary | ICD-10-CM | POA: Diagnosis not present

## 2021-04-04 DIAGNOSIS — Z1379 Encounter for other screening for genetic and chromosomal anomalies: Secondary | ICD-10-CM | POA: Insufficient documentation

## 2021-04-04 DIAGNOSIS — R160 Hepatomegaly, not elsewhere classified: Secondary | ICD-10-CM

## 2021-04-04 HISTORY — DX: Genetic susceptibility to other disease: Z15.89

## 2021-04-04 HISTORY — DX: Genetic susceptibility to other malignant neoplasm: Z15.09

## 2021-04-04 LAB — CMP (CANCER CENTER ONLY)
ALT: 7 U/L (ref 0–44)
AST: 18 U/L (ref 15–41)
Albumin: 3.7 g/dL (ref 3.5–5.0)
Alkaline Phosphatase: 103 U/L (ref 38–126)
Anion gap: 9 (ref 5–15)
BUN: 10 mg/dL (ref 6–20)
CO2: 27 mmol/L (ref 22–32)
Calcium: 8.8 mg/dL — ABNORMAL LOW (ref 8.9–10.3)
Chloride: 105 mmol/L (ref 98–111)
Creatinine: 0.84 mg/dL (ref 0.61–1.24)
GFR, Estimated: >60 mL/min (ref >60)
Glucose, Bld: 112 mg/dL — ABNORMAL HIGH (ref 70–99)
Potassium: 2.8 mmol/L — ABNORMAL LOW (ref 3.5–5.1)
Sodium: 141 mmol/L (ref 135–145)
Total Bilirubin: 1.7 mg/dL — ABNORMAL HIGH (ref 0.3–1.2)
Total Protein: 7.4 g/dL (ref 6.5–8.1)

## 2021-04-04 LAB — CBC WITH DIFFERENTIAL (CANCER CENTER ONLY)
Abs Immature Granulocytes: 0.03 10*3/uL (ref 0.00–0.07)
Basophils Absolute: 0.1 10*3/uL (ref 0.0–0.1)
Basophils Relative: 1 %
Eosinophils Absolute: 0 10*3/uL (ref 0.0–0.5)
Eosinophils Relative: 1 %
HCT: 30.2 % — ABNORMAL LOW (ref 39.0–52.0)
Hemoglobin: 10.2 g/dL — ABNORMAL LOW (ref 13.0–17.0)
Immature Granulocytes: 0 %
Lymphocytes Relative: 18 %
Lymphs Abs: 1.4 10*3/uL (ref 0.7–4.0)
MCH: 28.2 pg (ref 26.0–34.0)
MCHC: 33.8 g/dL (ref 30.0–36.0)
MCV: 83.4 fL (ref 80.0–100.0)
Monocytes Absolute: 0.7 10*3/uL (ref 0.1–1.0)
Monocytes Relative: 9 %
Neutro Abs: 5.6 10*3/uL (ref 1.7–7.7)
Neutrophils Relative %: 71 %
Platelet Count: 192 10*3/uL (ref 150–400)
RBC: 3.62 MIL/uL — ABNORMAL LOW (ref 4.22–5.81)
RDW: 12.1 % (ref 11.5–15.5)
WBC Count: 7.9 10*3/uL (ref 4.0–10.5)
nRBC: 0 % (ref 0.0–0.2)

## 2021-04-04 NOTE — Progress Notes (Addendum)
[place] OFFICE PROGRESS NOTE   Diagnosis: Prostate cancer, hepatocellular carcinoma  INTERVAL HISTORY:   Edgar Mooney was last seen at the Cancer center 01/13/2021.  He underwent the hepatic angiogram and shunt fraction study for Y 90 treatment on 02/15/2021.  He was scheduled to have the Y 90 therapy 2 weeks later.  He was incarcerated a few days after this procedure and [place] [facility]  Mr. Parthasarathy relates weight loss to not liking the food in jail.  He feels his hemoglobin is lower.  He denies bleeding.  He has pain in the right low anterior lateral chest.  He continues abiraterone and prednisone.  Objective:  Vital signs in last 24 hours:  Blood pressure 117/75, pulse 71, temperature 98.1 F (36.7 C), resp. rate 20, height $RemoveBe'5\' 2"'BkoVMFwlz$  (1.575 m), weight 139 lb (63 kg), SpO2 100 %.    Resp: Lungs clear bilaterally Cardio: Regular rate and rhythm GI: No hepatosplenomegaly, nontender, no mass Vascular: No leg edema   Lab Results:  Lab Results  Component Value Date   WBC 7.9 04/04/2021   HGB 10.2 (L) 04/04/2021   HCT 30.2 (L) 04/04/2021   MCV 83.4 04/04/2021   PLT 192 04/04/2021   NEUTROABS 5.6 04/04/2021    CMP  Lab Results  Component Value Date   NA 136 02/15/2021   K 4.1 02/15/2021   CL 106 02/15/2021   CO2 23 02/15/2021   GLUCOSE 102 (H) 02/15/2021   BUN 18 02/15/2021   CREATININE 0.69 02/15/2021   CALCIUM 9.2 02/15/2021   PROT 8.1 02/15/2021   ALBUMIN 4.8 02/15/2021   AST 57 (H) 02/15/2021   ALT 57 (H) 02/15/2021   ALKPHOS 87 02/15/2021   BILITOT 1.5 (H) 02/15/2021   GFRNONAA >60 02/15/2021   GFRAA 121 12/19/2017    Medications: I have reviewed the patient's current medications.   Assessment/Plan: Metastatic prostate cancer - Lytic bone lesions with retroperitoneal adenopathy concerning for metastatic prostate cancer -03/03/2020-CTA chest/abdomen/pelvis widespread osseous metastatic disease and lower retroperitoneal  adenopathy, pathologic right anterior third rib fracture, probable spinal canal tumor at the level of S1, -MRI cervical, thoracic, and lumbar spine 03/03/2020-diffuse osseous metastatic disease, ventral epidural tumor impinging on right S1 nerve root, extraosseous tumor at the bilateral ilium, asymmetric enhancing material at the right C5-6 canal-right  -03/03/2020-PSA 763 -Degarelix 03/04/2020 -Abiraterone/prednisone 03/14/2020 -Every 21-month Lupron 04/01/2020 -04/01/2020 PSA 36.5 -05/31/2020 PSA 1.2 -06/28/2020 PSA 1.0 -12/14/2020 PSA 6.4 -01/13/2021 PSA 10.6 2.  Severe anemia-likely secondary to metastatic prostate cancer involving the bones 3.  Mild thrombocytopenia 4.  Cirrhosis with splenomegaly 5.  History of hepatitis C 6.  History of polysubstance abuse 7.  Depression 8.  Tobacco dependence 9.  Right arm weakness, right facial numbness-potentially related to nerve root compromise from metastatic bone lesions 10.  Pain secondary to #1 11.  Extensive bone metastases-every 32-month Zometa starting 04/01/2020 12.  Gout-acute flare right wrist 04/01/2020 treated with indomethacin, allopurinol resumed 13.  Right hepatic lobe mass with elevated AFP concerning for [facility] MRI abdomen 12/21/2020-hypoenhancing inferior right liver mass, segment 6, occluding or compressing the adjacent right portal vein branch, intrahepatic cholangiocarcinoma favored with differential including hepatocellular carcinoma, Li-Rads M Y 90 planning study 11-22 14.  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 has metastatic prostate cancer.  The PSA has been higher  over the past few months.  I am concerned he has progressive prostate cancer.  The anemia is likely related to progression of prostate cancer.  We will follow-up on the  PSA from today.  If the PSA is higher we will consider changing to a different systemic therapy, likely a PARP inhibitor.  He has been diagnosed with hepatocellular carcinoma involving a right liver mass.  Pain at the right lower chest is like related to the prostate mass.  I recommend he be given oxycodone/APAP as needed for pain.  We will try to reschedule the Y 90 treatment with interventional radiology.  Mr. Fidalgo will return for an office visit in 3 weeks.  Betsy Coder, MD  04/04/2021  11:13 AM

## 2021-04-04 NOTE — Progress Notes (Signed)
Message sent to IR via Jocelyn Lamer that patient needs to proceed w/liver embolization and his current location and how to reach staff to schedule.

## 2021-04-05 ENCOUNTER — Telehealth: Payer: Self-pay

## 2021-04-05 NOTE — Telephone Encounter (Signed)
-----   Message from Ladell Pier, MD sent at 04/04/2021  4:14 PM EST ----- Please call patient, potassium is low, take potassium chloride 20 mEq twice daily for 3 days, then 20 mEq once daily, and CMP next visit  Please [facility]

## 2021-04-05 NOTE — Telephone Encounter (Signed)
Called and left a message for the infirmary to call back to discuss patient potassium level

## 2021-04-05 NOTE — Telephone Encounter (Signed)
Spoke with Deanna about the care of the patient.She forward me to the RN she take the order of the Potassium Chloride 20 MEq Twice daily for 3 days, then 20 mEq once daily.

## 2021-04-07 ENCOUNTER — Telehealth: Payer: Self-pay | Admitting: *Deleted

## 2021-04-07 NOTE — Telephone Encounter (Signed)
Spoke w/Deonne at [facility] and she is unsure if he has been taking the [place]. She took office contact information down and will have nurse call back to discuss.

## 2021-04-11 ENCOUNTER — Telehealth: Payer: Self-pay | Admitting: *Deleted

## 2021-04-11 ENCOUNTER — Telehealth: Payer: Self-pay

## 2021-04-11 NOTE — Telephone Encounter (Signed)
TC from [facility] at the detention center informing that Pt is taking his chemo meds and if any questions we can  give a return call to [phone]

## 2021-04-11 NOTE — Telephone Encounter (Signed)
Left VM for Edgar Mooney at detention center requesting she again request to have the nurse there call to discuss his chemotherapy medication, Zytiga. Left this RN's direct number for return call.

## 2021-04-12 ENCOUNTER — Telehealth: Payer: Self-pay | Admitting: *Deleted

## 2021-04-12 NOTE — Telephone Encounter (Signed)
Call from nurse at detention center that he has been taking the abiraterone 1000 mg daily. Is ordered by provider there and obtained by county funds. She would appreciate scripts to be faxed to ensure he will continue to receive medication. Scripts and last office note faxed to [phone].

## 2021-04-18 ENCOUNTER — Other Ambulatory Visit (HOSPITAL_COMMUNITY): Payer: Self-pay

## 2021-04-19 ENCOUNTER — Telehealth: Payer: Self-pay | Admitting: *Deleted

## 2021-04-19 NOTE — Telephone Encounter (Signed)
Return call from [facility]. They have spoken with [facility] and faxed over all the forms w/codes to them. Since procedure will be done by using [facility] funds, they are seeing if they will approve it being done while he is under there care. Under review by detention facility and [facility].

## 2021-04-19 NOTE — Telephone Encounter (Signed)
Left VM with phone # for Edgar Mooney at detention center to call to arrange his liver embolization procedure that was missed. Inquired if there is a phone # center can call to schedule with them or if it can be scheduled with this RN and information can be forwarded to patient/escort on his 04/26/21 visit at [facility].

## 2021-04-26 ENCOUNTER — Inpatient Hospital Stay: Payer: BC Managed Care – PPO

## 2021-04-26 ENCOUNTER — Other Ambulatory Visit: Payer: Self-pay

## 2021-04-26 ENCOUNTER — Inpatient Hospital Stay: Payer: BC Managed Care – PPO | Admitting: Nutrition

## 2021-04-26 ENCOUNTER — Encounter: Payer: Self-pay | Admitting: Nurse Practitioner

## 2021-04-26 ENCOUNTER — Inpatient Hospital Stay: Payer: BC Managed Care – PPO | Attending: Nurse Practitioner | Admitting: Nurse Practitioner

## 2021-04-26 VITALS — BP 138/78 | HR 65 | Temp 97.9°F | Resp 18 | Ht 62.0 in | Wt 142.6 lb

## 2021-04-26 DIAGNOSIS — I2510 Atherosclerotic heart disease of native coronary artery without angina pectoris: Secondary | ICD-10-CM | POA: Diagnosis not present

## 2021-04-26 DIAGNOSIS — F32A Depression, unspecified: Secondary | ICD-10-CM | POA: Insufficient documentation

## 2021-04-26 DIAGNOSIS — M25559 Pain in unspecified hip: Secondary | ICD-10-CM | POA: Insufficient documentation

## 2021-04-26 DIAGNOSIS — K7460 Unspecified cirrhosis of liver: Secondary | ICD-10-CM | POA: Diagnosis not present

## 2021-04-26 DIAGNOSIS — D649 Anemia, unspecified: Secondary | ICD-10-CM | POA: Diagnosis not present

## 2021-04-26 DIAGNOSIS — C7951 Secondary malignant neoplasm of bone: Secondary | ICD-10-CM | POA: Insufficient documentation

## 2021-04-26 DIAGNOSIS — C220 Liver cell carcinoma: Secondary | ICD-10-CM | POA: Diagnosis not present

## 2021-04-26 DIAGNOSIS — I252 Old myocardial infarction: Secondary | ICD-10-CM | POA: Diagnosis not present

## 2021-04-26 DIAGNOSIS — E876 Hypokalemia: Secondary | ICD-10-CM | POA: Diagnosis not present

## 2021-04-26 DIAGNOSIS — Z8619 Personal history of other infectious and parasitic diseases: Secondary | ICD-10-CM | POA: Diagnosis not present

## 2021-04-26 DIAGNOSIS — D696 Thrombocytopenia, unspecified: Secondary | ICD-10-CM | POA: Insufficient documentation

## 2021-04-26 DIAGNOSIS — F1911 Other psychoactive substance abuse, in remission: Secondary | ICD-10-CM | POA: Diagnosis not present

## 2021-04-26 DIAGNOSIS — R162 Hepatomegaly with splenomegaly, not elsewhere classified: Secondary | ICD-10-CM | POA: Insufficient documentation

## 2021-04-26 DIAGNOSIS — F17200 Nicotine dependence, unspecified, uncomplicated: Secondary | ICD-10-CM | POA: Insufficient documentation

## 2021-04-26 DIAGNOSIS — C61 Malignant neoplasm of prostate: Secondary | ICD-10-CM

## 2021-04-26 DIAGNOSIS — R160 Hepatomegaly, not elsewhere classified: Secondary | ICD-10-CM

## 2021-04-26 LAB — CMP (CANCER CENTER ONLY)
ALT: 8 U/L (ref 0–44)
AST: 13 U/L — ABNORMAL LOW (ref 15–41)
Albumin: 4.2 g/dL (ref 3.5–5.0)
Alkaline Phosphatase: 84 U/L (ref 38–126)
Anion gap: 11 (ref 5–15)
BUN: 11 mg/dL (ref 6–20)
CO2: 23 mmol/L (ref 22–32)
Calcium: 9.2 mg/dL (ref 8.9–10.3)
Chloride: 107 mmol/L (ref 98–111)
Creatinine: 0.52 mg/dL — ABNORMAL LOW (ref 0.61–1.24)
GFR, Estimated: >60 mL/min (ref >60)
Glucose, Bld: 138 mg/dL — ABNORMAL HIGH (ref 70–99)
Potassium: 2.8 mmol/L — ABNORMAL LOW (ref 3.5–5.1)
Sodium: 141 mmol/L (ref 135–145)
Total Bilirubin: 1.6 mg/dL — ABNORMAL HIGH (ref 0.3–1.2)
Total Protein: 7.3 g/dL (ref 6.5–8.1)

## 2021-04-26 LAB — CBC WITH DIFFERENTIAL (CANCER CENTER ONLY)
Abs Immature Granulocytes: 0.05 10*3/uL (ref 0.00–0.07)
Basophils Absolute: 0.1 10*3/uL (ref 0.0–0.1)
Basophils Relative: 1 %
Eosinophils Absolute: 0.2 10*3/uL (ref 0.0–0.5)
Eosinophils Relative: 2 %
HCT: 31.3 % — ABNORMAL LOW (ref 39.0–52.0)
Hemoglobin: 10.7 g/dL — ABNORMAL LOW (ref 13.0–17.0)
Immature Granulocytes: 1 %
Lymphocytes Relative: 20 %
Lymphs Abs: 1.9 10*3/uL (ref 0.7–4.0)
MCH: 29.2 pg (ref 26.0–34.0)
MCHC: 34.2 g/dL (ref 30.0–36.0)
MCV: 85.3 fL (ref 80.0–100.0)
Monocytes Absolute: 0.9 10*3/uL (ref 0.1–1.0)
Monocytes Relative: 9 %
Neutro Abs: 6.7 10*3/uL (ref 1.7–7.7)
Neutrophils Relative %: 67 %
Platelet Count: 151 10*3/uL (ref 150–400)
RBC: 3.67 MIL/uL — ABNORMAL LOW (ref 4.22–5.81)
RDW: 16.3 % — ABNORMAL HIGH (ref 11.5–15.5)
WBC Count: 9.7 10*3/uL (ref 4.0–10.5)
nRBC: 0 % (ref 0.0–0.2)

## 2021-04-26 LAB — SAMPLE TO BLOOD BANK

## 2021-04-26 MED ORDER — HEPARIN SOD (PORK) LOCK FLUSH 100 UNIT/ML IV SOLN: 500.0000 [IU] | Freq: Once | INTRAVENOUS | Status: DC | PRN

## 2021-04-26 MED ORDER — ALTEPLASE 2 MG IJ SOLR: 2.0000 mg | Freq: Once | INTRAMUSCULAR | Status: DC | PRN

## 2021-04-26 MED ORDER — SODIUM CHLORIDE 0.9% FLUSH: 10.0000 mL | Freq: Once | INTRAVENOUS | Status: DC | PRN

## 2021-04-26 MED ORDER — LEUPROLIDE ACETATE (3 MONTH) 22.5 MG ~~LOC~~ KIT
22.5000 mg | PACK | Freq: Once | SUBCUTANEOUS | Status: AC
  Administered 2021-04-26: 22.5 mg via SUBCUTANEOUS
  Filled 2021-04-26: qty 22.5

## 2021-04-26 MED ORDER — SODIUM CHLORIDE 0.9 % IV SOLN: Freq: Once | INTRAVENOUS | Status: DC

## 2021-04-26 MED ORDER — ZOLEDRONIC ACID 4 MG/100ML IV SOLN
4.0000 mg | Freq: Once | INTRAVENOUS | Status: AC
  Administered 2021-04-26: 4 mg via INTRAVENOUS
  Filled 2021-04-26: qty 100

## 2021-04-26 MED ORDER — HEPARIN SOD (PORK) LOCK FLUSH 100 UNIT/ML IV SOLN: 250.0000 [IU] | Freq: Once | INTRAVENOUS | Status: DC | PRN

## 2021-04-26 MED ORDER — SODIUM CHLORIDE 0.9% FLUSH: 3.0000 mL | Freq: Once | INTRAVENOUS | Status: DC | PRN

## 2021-04-26 MED ORDER — SODIUM CHLORIDE 0.9 % IV SOLN: Freq: Once | INTRAVENOUS | Status: AC

## 2021-04-26 NOTE — Progress Notes (Signed)
[place] OFFICE PROGRESS NOTE   Diagnosis: Prostate cancer, hepatocellular carcinoma  INTERVAL HISTORY:   Mr. Walston returns as scheduled.  He continues abiraterone.  He has periodic hot flashes.  Oral intake is better.  He complains of bilateral hip pain.  He is unable to have narcotic pain medication at the detention center.  He notes that Tylenol plus ibuprofen is effective for the pain.  He would like to have this on a scheduled basis.  Objective:  Vital signs in last 24 hours:  Blood pressure 138/78, pulse 65, temperature 97.9 F (36.6 C), resp. rate 18, height $RemoveBe'5\' 2"'PZIhNLBeE$  (1.575 m), weight 142 lb 9.6 oz (64.7 kg), SpO2 100 %.    Resp: Lungs clear bilaterally. Cardio: Regular rate and rhythm. GI: Abdomen soft and nontender.  No hepatomegaly. Vascular: No leg edema.   Lab Results:  Lab Results  Component Value Date   WBC 9.7 04/26/2021   HGB 10.7 (L) 04/26/2021   HCT 31.3 (L) 04/26/2021   MCV 85.3 04/26/2021   PLT 151 04/26/2021   NEUTROABS 6.7 04/26/2021    Imaging:  No results found.  Medications: I have reviewed the patient's current medications.  Assessment/Plan: Metastatic prostate cancer - Lytic bone lesions with retroperitoneal adenopathy concerning for metastatic prostate cancer -03/03/2020-CTA chest/abdomen/pelvis widespread osseous metastatic disease and lower retroperitoneal adenopathy, pathologic right anterior third rib fracture, probable spinal canal tumor at the level of S1, -MRI cervical, thoracic, and lumbar spine 03/03/2020-diffuse osseous metastatic disease, ventral epidural tumor impinging on right S1 nerve root, extraosseous tumor at the bilateral ilium, asymmetric enhancing material at the right C5-6 canal-right  -03/03/2020-PSA 763 -Degarelix 03/04/2020 -Abiraterone/prednisone 03/14/2020 -Every 10-month Lupron 04/01/2020 -04/01/2020 PSA 36.5 -05/31/2020 PSA 1.2 -06/28/2020 PSA 1.0 -12/14/2020 PSA 6.4 -01/13/2021 PSA  10.6 -04/26/2021 PSA pending  2.  Severe anemia-likely secondary to metastatic prostate cancer involving the bones 3.  Mild thrombocytopenia 4.  Cirrhosis with splenomegaly 5.  History of hepatitis C 6.  History of polysubstance abuse 7.  Depression 8.  Tobacco dependence 9.  Right arm weakness, right facial numbness-potentially related to nerve root compromise from metastatic bone lesions 10.  Pain secondary to #1 11.  Extensive bone metastases-every 2-month Zometa starting 04/01/2020 12.  Gout-acute flare right wrist 04/01/2020 treated with indomethacin, allopurinol resumed 13.  Right hepatic lobe mass with elevated AFP concerning for [facility] MRI abdomen 12/21/2020-hypoenhancing inferior right liver mass, segment 6, occluding or compressing the adjacent right portal vein branch, intrahepatic cholangiocarcinoma favored with differential including hepatocellular carcinoma, Li-Rads M Y 90 planning study 11-22 14.  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 Marvin appears stable.  He is currently on abiraterone and every 41-month Lupron.  He is due for a Lupron injection today, also Zometa.  We will follow-up on the PSA.    He has undergone Y 90 planning for treatment of [place].  We are trying to reschedule the Y 90 treatment with Interventional Radiology.  CBC and chemistry panel from today reviewed.  Hemoglobin is better.  He has persistent hypokalemia.  He will take K-Dur 20 milliequivalents twice daily for 3 days and then take 20 mEq daily.  We will repeat the CBC and chemistry panel in 3 weeks.  For pain he will begin Tylenol 325 mg plus ibuprofen 400 mg every 6 hours around-the-clock.  The pain  is likely related to metastatic prostate cancer.  Recommend a nutritional supplement 3 times a day.  He will return  for lab and follow-up 05/16/2021.  We are available to see him sooner if needed.    Ned Card ANP/GNP-BC   04/26/2021  9:14 AM

## 2021-04-26 NOTE — Progress Notes (Signed)
Brief interaction with 56 year old male patient during infusion for Prostate and Hepatocellular cancer. [facility] [place] [facility] and has limited access to snacks and food choices.  PMH includes Cirrhosis, Hep C, polysubstance abuse, depression, tobacco, gout.  Labs reviewed  Medications include Zofran, prednisone.  Height:5'2" Weight: 142.6 pounds. UBW: 160-165 pounds per patient BMI: 26.08  Patient concerned with weight loss and loss of strength. He reports some taste alterations.  Nutrition Diagnosis: Food and Nutrition Related Knowledge Deficit related to inadequate oral intake as evidenced by no prior need for nutrition information.  Intervention: Educated to consume high calorie, high protein foods at meals. Add oral nutrition supplements such as Ensure plus or equivalent TID between meals.  Provided samples. Provided nutrition fact sheets.  Monitoring, Evaluation, Goals: Patient will tolerate increased calories and protein to minimize wt loss.  Next Visit: To be scheduled as needed.

## 2021-04-26 NOTE — Patient Instructions (Signed)
[facility]   Discharge Instructions: Thank you for choosing [facility] to provide your oncology and hematology care.   If you have a lab appointment with the [facility], please go directly to the [facility] and check in at the registration area.   Wear comfortable clothing and clothing appropriate for easy access to any Portacath or PICC line.   We strive to give you quality time with your provider. You may need to reschedule your appointment if you arrive late (15 or more minutes).  Arriving late affects you and other patients whose appointments are after yours.  Also, if you miss three or more appointments without notifying the office, you may be dismissed from the clinic at the providers discretion.      For prescription refill requests, have your pharmacy contact our office and allow 72 hours for refills to be completed.    Today you received the following Eligard, Zometa      To help prevent nausea and vomiting after your treatment, we encourage you to take your nausea medication as directed.  BELOW ARE SYMPTOMS THAT SHOULD BE REPORTED IMMEDIATELY: *FEVER GREATER THAN 100.4 F (38 C) OR HIGHER *CHILLS OR SWEATING *NAUSEA AND VOMITING THAT IS NOT CONTROLLED WITH YOUR NAUSEA MEDICATION *UNUSUAL SHORTNESS OF BREATH *UNUSUAL BRUISING OR BLEEDING *URINARY PROBLEMS (pain or burning when urinating, or frequent urination) *BOWEL PROBLEMS (unusual diarrhea, constipation, pain near the anus) TENDERNESS IN MOUTH AND THROAT WITH OR WITHOUT PRESENCE OF ULCERS (sore throat, sores in mouth, or a toothache) UNUSUAL RASH, SWELLING OR PAIN  UNUSUAL VAGINAL DISCHARGE OR ITCHING   Items with * indicate a potential emergency and should be followed up as soon as possible or go to the Emergency Department if any problems should occur.  Please show the CHEMOTHERAPY ALERT CARD or IMMUNOTHERAPY ALERT CARD at check-in to the Emergency Department and triage  nurse.  Should you have questions after your visit or need to cancel or reschedule your appointment, please contact [facility]  Dept: [phone]  and follow the prompts.  Office hours are 8:00 a.m. to 4:30 p.m. Monday - Friday. Please note that voicemails left after 4:00 p.m. may not be returned until the following business day.  We are closed weekends and major holidays. You have access to a nurse at all times for urgent questions. Please call the main number to the clinic Dept: [phone] and follow the prompts.   For any non-urgent questions, you may also contact your provider using MyChart. We now offer e-Visits for anyone 65 and older to request care online for non-urgent symptoms. For details visit [email or web address].   Also download the MyChart app! Go to the app store, search "MyChart", open the app, select [facility], and log in with your MyChart username and password.  Due to Covid, a mask is required upon entering the hospital/clinic. If you do not have a mask, one will be given to you upon arrival. For doctor visits, patients may have 1 support person aged 84 or older with them. For treatment visits, patients cannot have anyone with them due to current Covid guidelines and our immunocompromised population.   Leuprolide depot injection What is this medication? LEUPROLIDE (loo PROE lide) is a man-made protein that acts like a natural hormone in the body. It decreases testosterone in men and decreases estrogen in women. In men, this medicine is used to treat advanced prostate cancer. In women, some forms of this  medicine may be used to treat endometriosis, uterine fibroids, or other male hormone-related problems. This medicine may be used for other purposes; ask your health care provider or pharmacist if you have questions. COMMON BRAND NAME(S): Eligard, Fensolv, Lupron Depot, Lupron Depot-Ped, Viadur What should I tell my care team before I take this  medication? They need to know if you have any of these conditions: diabetes heart disease or previous heart attack high blood pressure high cholesterol mental illness osteoporosis pain or difficulty passing urine seizures spinal cord metastasis stroke suicidal thoughts, plans, or attempt; a previous suicide attempt by you or a family member tobacco smoker unusual vaginal bleeding (women) an unusual or allergic reaction to leuprolide, benzyl alcohol, other medicines, foods, dyes, or preservatives pregnant or trying to get pregnant breast-feeding How should I use this medication? This medicine is for injection into a muscle or for injection under the skin. It is given by a health care professional in a hospital or clinic setting. The specific product will determine how it will be given to you. Make sure you understand which product you receive and how often you will receive it. Talk to your pediatrician regarding the use of this medicine in children. Special care may be needed. Overdosage: If you think you have taken too much of this medicine contact a poison control center or emergency room at once. NOTE: This medicine is only for you. Do not share this medicine with others. What if I miss a dose? It is important not to miss a dose. Call your doctor or health care professional if you are unable to keep an appointment. Depot injections: Depot injections are given either once-monthly, every 12 weeks, every 16 weeks, or every 24 weeks depending on the product you are prescribed. The product you are prescribed will be based on if you are male or male, and your condition. Make sure you understand your product and dosing. What may interact with this medication? Do not take this medicine with any of the following medications: chasteberry cisapride dronedarone pimozide thioridazine This medicine may also interact with the following medications: herbal or dietary supplements, like black  cohosh or DHEA male hormones, like estrogens or progestins and birth control pills, patches, rings, or injections male hormones, like testosterone other medicines that prolong the QT interval (abnormal heart rhythm) This list may not describe all possible interactions. Give your health care provider a list of all the medicines, herbs, non-prescription drugs, or dietary supplements you use. Also tell them if you smoke, drink alcohol, or use illegal drugs. Some items may interact with your medicine. What should I watch for while using this medication? Visit your doctor or health care professional for regular checks on your progress. During the first weeks of treatment, your symptoms may get worse, but then will improve as you continue your treatment. You may get hot flashes, increased bone pain, increased difficulty passing urine, or an aggravation of nerve symptoms. Discuss these effects with your doctor or health care professional, some of them may improve with continued use of this medicine. Male patients may experience a menstrual cycle or spotting during the first months of therapy with this medicine. If this continues, contact your doctor or health care professional. This medicine may increase blood sugar. Ask your healthcare provider if changes in diet or medicines are needed if you have diabetes. What side effects may I notice from receiving this medication? Side effects that you should report to your doctor or health care professional as soon  as possible: allergic reactions like skin rash, itching or hives, swelling of the face, lips, or tongue breathing problems chest pain depression or memory disorders pain in your legs or groin pain at site where injected or implanted seizures severe headache signs and symptoms of high blood sugar such as being more thirsty or hungry or having to urinate more than normal. You may also feel very tired or have blurry vision swelling of the feet and  legs suicidal thoughts or other mood changes visual changes vomiting Side effects that usually do not require medical attention (report to your doctor or health care professional if they continue or are bothersome): breast swelling or tenderness decrease in sex drive or performance diarrhea hot flashes loss of appetite muscle, joint, or bone pains nausea redness or irritation at site where injected or implanted skin problems or acne This list may not describe all possible side effects. Call your doctor for medical advice about side effects. You may report side effects to FDA at 1-800-FDA-1088. Where should I keep my medication? This drug is given in a hospital or clinic and will not be stored at home. NOTE: This sheet is a summary. It may not cover all possible information. If you have questions about this medicine, talk to your doctor, pharmacist, or health care provider.  2022 Elsevier/Gold Standard (2020-12-20 00:00:00)  Zoledronic Acid Injection (Hypercalcemia, Oncology) What is this medication? ZOLEDRONIC ACID (ZOE le dron ik AS id) slows calcium loss from bones. It high calcium levels in the blood from some kinds of cancer. It may be used in other people at risk for bone loss. This medicine may be used for other purposes; ask your health care provider or pharmacist if you have questions. COMMON BRAND NAME(S): Zometa What should I tell my care team before I take this medication? They need to know if you have any of these conditions: cancer dehydration dental disease kidney disease liver disease low levels of calcium in the blood lung or breathing disease (asthma) receiving steroids like dexamethasone or prednisone an unusual or allergic reaction to zoledronic acid, other medicines, foods, dyes, or preservatives pregnant or trying to get pregnant breast-feeding How should I use this medication? This drug is injected into a vein. It is given by a health care provider in a  hospital or clinic setting. Talk to your health care provider about the use of this drug in children. Special care may be needed. Overdosage: If you think you have taken too much of this medicine contact a poison control center or emergency room at once. NOTE: This medicine is only for you. Do not share this medicine with others. What if I miss a dose? Keep appointments for follow-up doses. It is important not to miss your dose. Call your health care provider if you are unable to keep an appointment. What may interact with this medication? certain antibiotics given by injection NSAIDs, medicines for pain and inflammation, like ibuprofen or naproxen some diuretics like bumetanide, furosemide teriparatide thalidomide This list may not describe all possible interactions. Give your health care provider a list of all the medicines, herbs, non-prescription drugs, or dietary supplements you use. Also tell them if you smoke, drink alcohol, or use illegal drugs. Some items may interact with your medicine. What should I watch for while using this medication? Visit your health care provider for regular checks on your progress. It may be some time before you see the benefit from this drug. Some people who take this drug have severe  bone, joint, or muscle pain. This drug may also increase your risk for jaw problems or a broken thigh bone. Tell your health care provider right away if you have severe pain in your jaw, bones, joints, or muscles. Tell you health care provider if you have any pain that does not go away or that gets worse. Tell your dentist and dental surgeon that you are taking this drug. You should not have major dental surgery while on this drug. See your dentist to have a dental exam and fix any dental problems before starting this drug. Take good care of your teeth while on this drug. Make sure you see your dentist for regular follow-up appointments. You should make sure you get enough calcium  and vitamin D while you are taking this drug. Discuss the foods you eat and the vitamins you take with your health care provider. Check with your health care provider if you have severe diarrhea, nausea, and vomiting, or if you sweat a lot. The loss of too much body fluid may make it dangerous for you to take this drug. You may need blood work done while you are taking this drug. Do not become pregnant while taking this drug. Women should inform their health care provider if they wish to become pregnant or think they might be pregnant. There is potential for serious harm to an unborn child. Talk to your health care provider for more information. What side effects may I notice from receiving this medication? Side effects that you should report to your doctor or health care provider as soon as possible: allergic reactions (skin rash, itching or hives; swelling of the face, lips, or tongue) bone pain infection (fever, chills, cough, sore throat, pain or trouble passing urine) jaw pain, especially after dental work joint pain kidney injury (trouble passing urine or change in the amount of urine) low blood pressure (dizziness; feeling faint or lightheaded, falls; unusually weak or tired) low calcium levels (fast heartbeat; muscle cramps or pain; pain, tingling, or numbness in the hands or feet; seizures) low magnesium levels (fast, irregular heartbeat; muscle cramp or pain; muscle weakness; tremors; seizures) low red blood cell counts (trouble breathing; feeling faint; lightheaded, falls; unusually weak or tired) muscle pain redness, blistering, peeling, or loosening of the skin, including inside the mouth severe diarrhea swelling of the ankles, feet, hands trouble breathing Side effects that usually do not require medical attention (report to your doctor or health care provider if they continue or are bothersome): anxious constipation coughing depressed mood eye irritation, itching, or  pain fever general ill feeling or flu-like symptoms nausea pain, redness, or irritation at site where injected trouble sleeping This list may not describe all possible side effects. Call your doctor for medical advice about side effects. You may report side effects to FDA at 1-800-FDA-1088. Where should I keep my medication? This drug is given in a hospital or clinic. It will not be stored at home. NOTE: This sheet is a summary. It may not cover all possible information. If you have questions about this medicine, talk to your doctor, pharmacist, or health care provider.  2022 Elsevier/Gold Standard (2020-12-20 00:00:00)

## 2021-04-27 LAB — PROSTATE-SPECIFIC AG, SERUM (LABCORP): Prostate Specific Ag, Serum: 45.2 ng/mL — ABNORMAL HIGH (ref 0.0–4.0)

## 2021-05-08 ENCOUNTER — Telehealth: Payer: Self-pay

## 2021-05-08 ENCOUNTER — Other Ambulatory Visit: Payer: Self-pay | Admitting: Nurse Practitioner

## 2021-05-08 DIAGNOSIS — R160 Hepatomegaly, not elsewhere classified: Secondary | ICD-10-CM

## 2021-05-08 DIAGNOSIS — C61 Malignant neoplasm of prostate: Secondary | ICD-10-CM

## 2021-05-08 NOTE — Telephone Encounter (Signed)
Called and spoke with the patient regarding his voicemail. Patient stated he is out of jail and he needs a refill on oxycodone-acetaminophen. The refill request is place on the provider desk.

## 2021-05-09 ENCOUNTER — Other Ambulatory Visit: Payer: Self-pay | Admitting: Nurse Practitioner

## 2021-05-09 ENCOUNTER — Telehealth: Payer: Self-pay | Admitting: *Deleted

## 2021-05-09 DIAGNOSIS — C61 Malignant neoplasm of prostate: Secondary | ICD-10-CM

## 2021-05-09 MED ORDER — HYDROCODONE-ACETAMINOPHEN 5-325 MG PO TABS: 1.0000 | ORAL_TABLET | Freq: Four times a day (QID) | ORAL | 0 refills | Status: DC | PRN

## 2021-05-09 NOTE — Telephone Encounter (Signed)
Notified Edgar Mooney that patient is now out of jail and to please proceed with getting his Y-90 scheduled.

## 2021-05-09 NOTE — Telephone Encounter (Signed)
I called Mr. Lux and left a voice mail for him to call me with his insurance information to start the Y-90 procedure authorization again.Cathren Harsh

## 2021-05-11 ENCOUNTER — Other Ambulatory Visit: Payer: Self-pay | Admitting: Oncology

## 2021-05-11 ENCOUNTER — Other Ambulatory Visit (HOSPITAL_COMMUNITY): Payer: Self-pay

## 2021-05-16 ENCOUNTER — Other Ambulatory Visit: Payer: Self-pay

## 2021-05-16 ENCOUNTER — Encounter: Payer: Self-pay | Admitting: *Deleted

## 2021-05-16 ENCOUNTER — Inpatient Hospital Stay: Payer: BC Managed Care – PPO

## 2021-05-16 ENCOUNTER — Other Ambulatory Visit: Payer: Self-pay | Admitting: Oncology

## 2021-05-16 ENCOUNTER — Inpatient Hospital Stay (HOSPITAL_BASED_OUTPATIENT_CLINIC_OR_DEPARTMENT_OTHER): Payer: BC Managed Care – PPO | Admitting: Oncology

## 2021-05-16 ENCOUNTER — Ambulatory Visit (HOSPITAL_BASED_OUTPATIENT_CLINIC_OR_DEPARTMENT_OTHER)
Admission: RE | Admit: 2021-05-16 | Discharge: 2021-05-16 | Disposition: A | Discharge status: Home / Self Care | Payer: BC Managed Care – PPO | Source: Ambulatory Visit | Attending: Oncology | Admitting: Oncology

## 2021-05-16 VITALS — BP 130/77 | HR 71 | Temp 97.8°F | Resp 20 | Ht 62.0 in | Wt 149.0 lb

## 2021-05-16 DIAGNOSIS — I2510 Atherosclerotic heart disease of native coronary artery without angina pectoris: Secondary | ICD-10-CM | POA: Diagnosis not present

## 2021-05-16 DIAGNOSIS — Z8619 Personal history of other infectious and parasitic diseases: Secondary | ICD-10-CM | POA: Diagnosis not present

## 2021-05-16 DIAGNOSIS — R160 Hepatomegaly, not elsewhere classified: Secondary | ICD-10-CM

## 2021-05-16 DIAGNOSIS — C61 Malignant neoplasm of prostate: Secondary | ICD-10-CM

## 2021-05-16 DIAGNOSIS — K7460 Unspecified cirrhosis of liver: Secondary | ICD-10-CM | POA: Diagnosis not present

## 2021-05-16 DIAGNOSIS — E876 Hypokalemia: Secondary | ICD-10-CM | POA: Diagnosis not present

## 2021-05-16 DIAGNOSIS — F17200 Nicotine dependence, unspecified, uncomplicated: Secondary | ICD-10-CM | POA: Diagnosis not present

## 2021-05-16 DIAGNOSIS — M25552 Pain in left hip: Secondary | ICD-10-CM | POA: Diagnosis not present

## 2021-05-16 DIAGNOSIS — R162 Hepatomegaly with splenomegaly, not elsewhere classified: Secondary | ICD-10-CM | POA: Diagnosis not present

## 2021-05-16 DIAGNOSIS — F32A Depression, unspecified: Secondary | ICD-10-CM | POA: Diagnosis not present

## 2021-05-16 DIAGNOSIS — M25559 Pain in unspecified hip: Secondary | ICD-10-CM | POA: Diagnosis not present

## 2021-05-16 DIAGNOSIS — D696 Thrombocytopenia, unspecified: Secondary | ICD-10-CM | POA: Diagnosis not present

## 2021-05-16 DIAGNOSIS — C7951 Secondary malignant neoplasm of bone: Secondary | ICD-10-CM | POA: Diagnosis not present

## 2021-05-16 DIAGNOSIS — I252 Old myocardial infarction: Secondary | ICD-10-CM | POA: Diagnosis not present

## 2021-05-16 DIAGNOSIS — C220 Liver cell carcinoma: Secondary | ICD-10-CM | POA: Diagnosis not present

## 2021-05-16 DIAGNOSIS — D649 Anemia, unspecified: Secondary | ICD-10-CM | POA: Diagnosis not present

## 2021-05-16 DIAGNOSIS — F1911 Other psychoactive substance abuse, in remission: Secondary | ICD-10-CM | POA: Diagnosis not present

## 2021-05-16 LAB — CMP (CANCER CENTER ONLY)
ALT: 34 U/L (ref 0–44)
AST: 14 U/L — ABNORMAL LOW (ref 15–41)
Albumin: 4.1 g/dL (ref 3.5–5.0)
Alkaline Phosphatase: 101 U/L (ref 38–126)
Anion gap: 9 (ref 5–15)
BUN: 22 mg/dL — ABNORMAL HIGH (ref 6–20)
CO2: 22 mmol/L (ref 22–32)
Calcium: 9.7 mg/dL (ref 8.9–10.3)
Chloride: 107 mmol/L (ref 98–111)
Creatinine: 0.52 mg/dL — ABNORMAL LOW (ref 0.61–1.24)
GFR, Estimated: >60 mL/min (ref >60)
Glucose, Bld: 114 mg/dL — ABNORMAL HIGH (ref 70–99)
Potassium: 4.2 mmol/L (ref 3.5–5.1)
Sodium: 138 mmol/L (ref 135–145)
Total Bilirubin: 0.6 mg/dL (ref 0.3–1.2)
Total Protein: 7.3 g/dL (ref 6.5–8.1)

## 2021-05-16 LAB — CBC WITH DIFFERENTIAL (CANCER CENTER ONLY)
Abs Immature Granulocytes: 0.07 10*3/uL (ref 0.00–0.07)
Basophils Absolute: 0.1 10*3/uL (ref 0.0–0.1)
Basophils Relative: 1 %
Eosinophils Absolute: 0.3 10*3/uL (ref 0.0–0.5)
Eosinophils Relative: 3 %
HCT: 33.7 % — ABNORMAL LOW (ref 39.0–52.0)
Hemoglobin: 11 g/dL — ABNORMAL LOW (ref 13.0–17.0)
Immature Granulocytes: 1 %
Lymphocytes Relative: 21 %
Lymphs Abs: 2 10*3/uL (ref 0.7–4.0)
MCH: 29.6 pg (ref 26.0–34.0)
MCHC: 32.6 g/dL (ref 30.0–36.0)
MCV: 90.8 fL (ref 80.0–100.0)
Monocytes Absolute: 0.8 10*3/uL (ref 0.1–1.0)
Monocytes Relative: 8 %
Neutro Abs: 6.4 10*3/uL (ref 1.7–7.7)
Neutrophils Relative %: 66 %
Platelet Count: 205 10*3/uL (ref 150–400)
RBC: 3.71 MIL/uL — ABNORMAL LOW (ref 4.22–5.81)
RDW: 16.4 % — ABNORMAL HIGH (ref 11.5–15.5)
WBC Count: 9.6 10*3/uL (ref 4.0–10.5)
nRBC: 0 % (ref 0.0–0.2)

## 2021-05-16 IMAGING — DX DG HIP (WITH OR WITHOUT PELVIS) 2-3V*L*
3 series · 3 of 3 positions shown · non-contrast
Comparison: Abdominopelvic CT [DATE].

CLINICAL DATA: Prostate cancer.  Left hip pain.

EXAM:
DG HIP (WITH OR WITHOUT PELVIS) 2-3V LEFT

[pelvis ap]
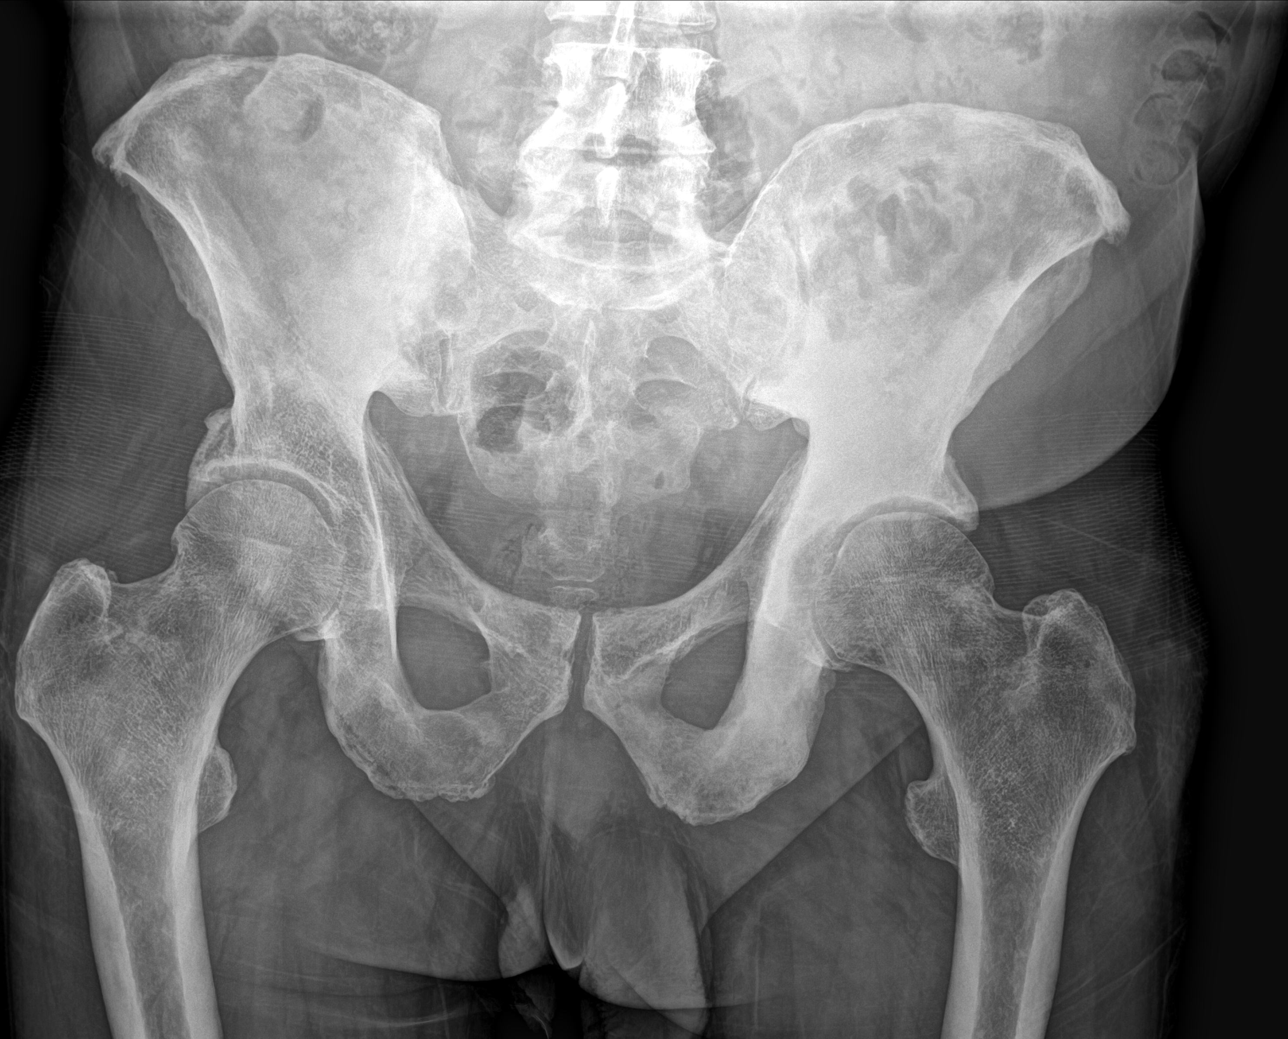

[hip ap]
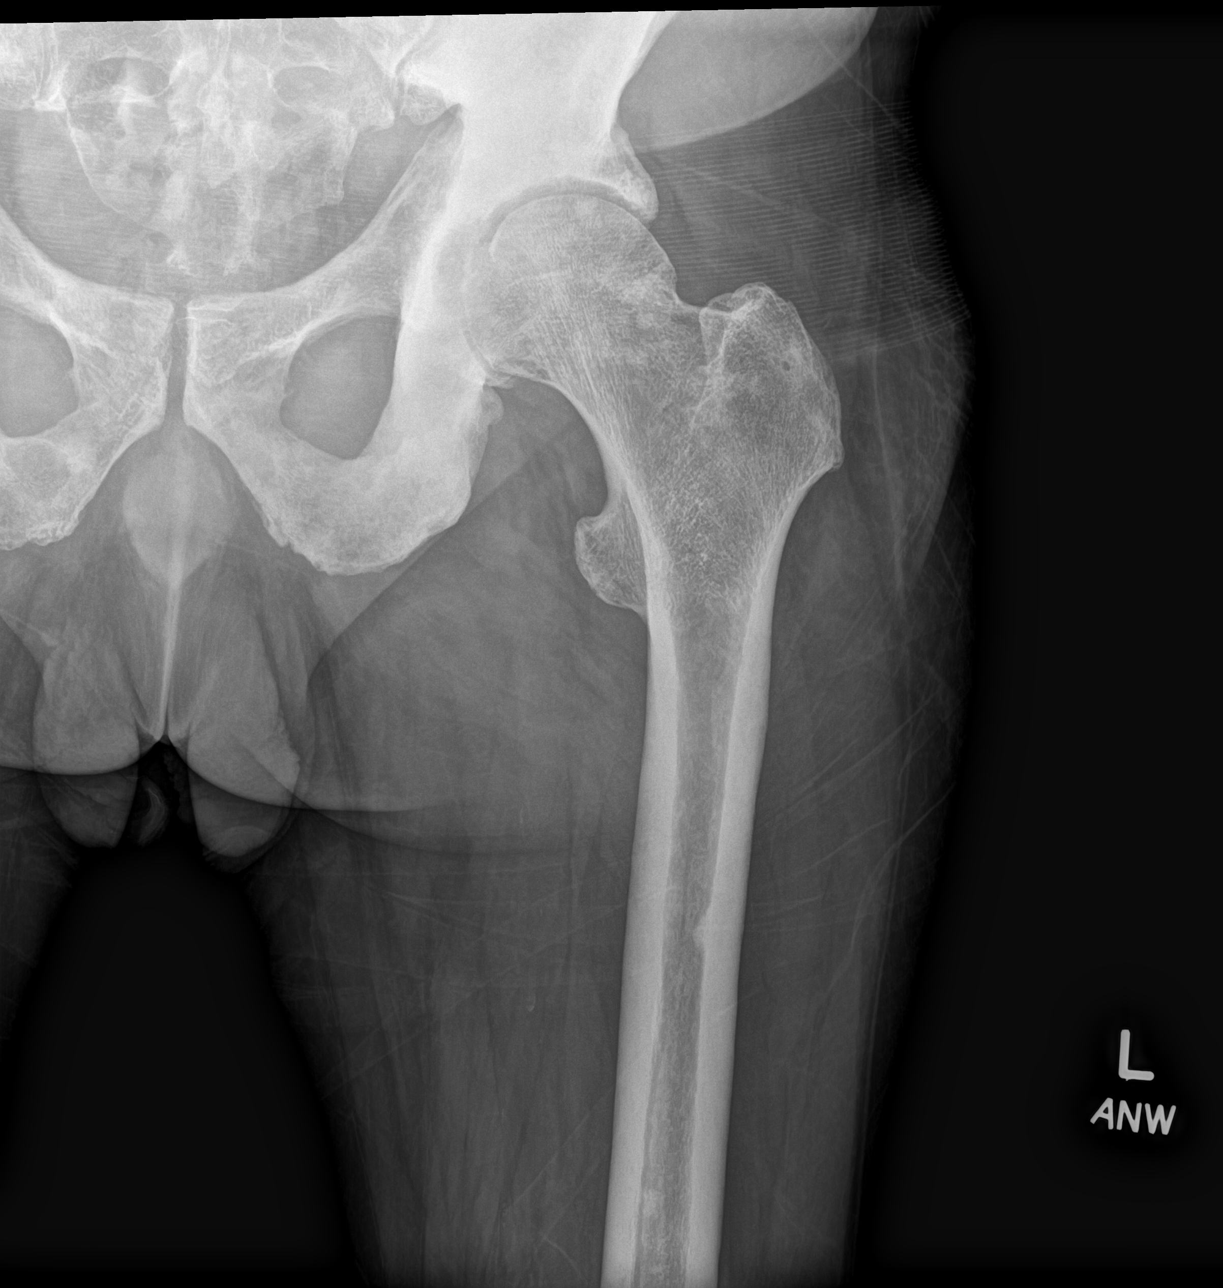

[hip frog leg]
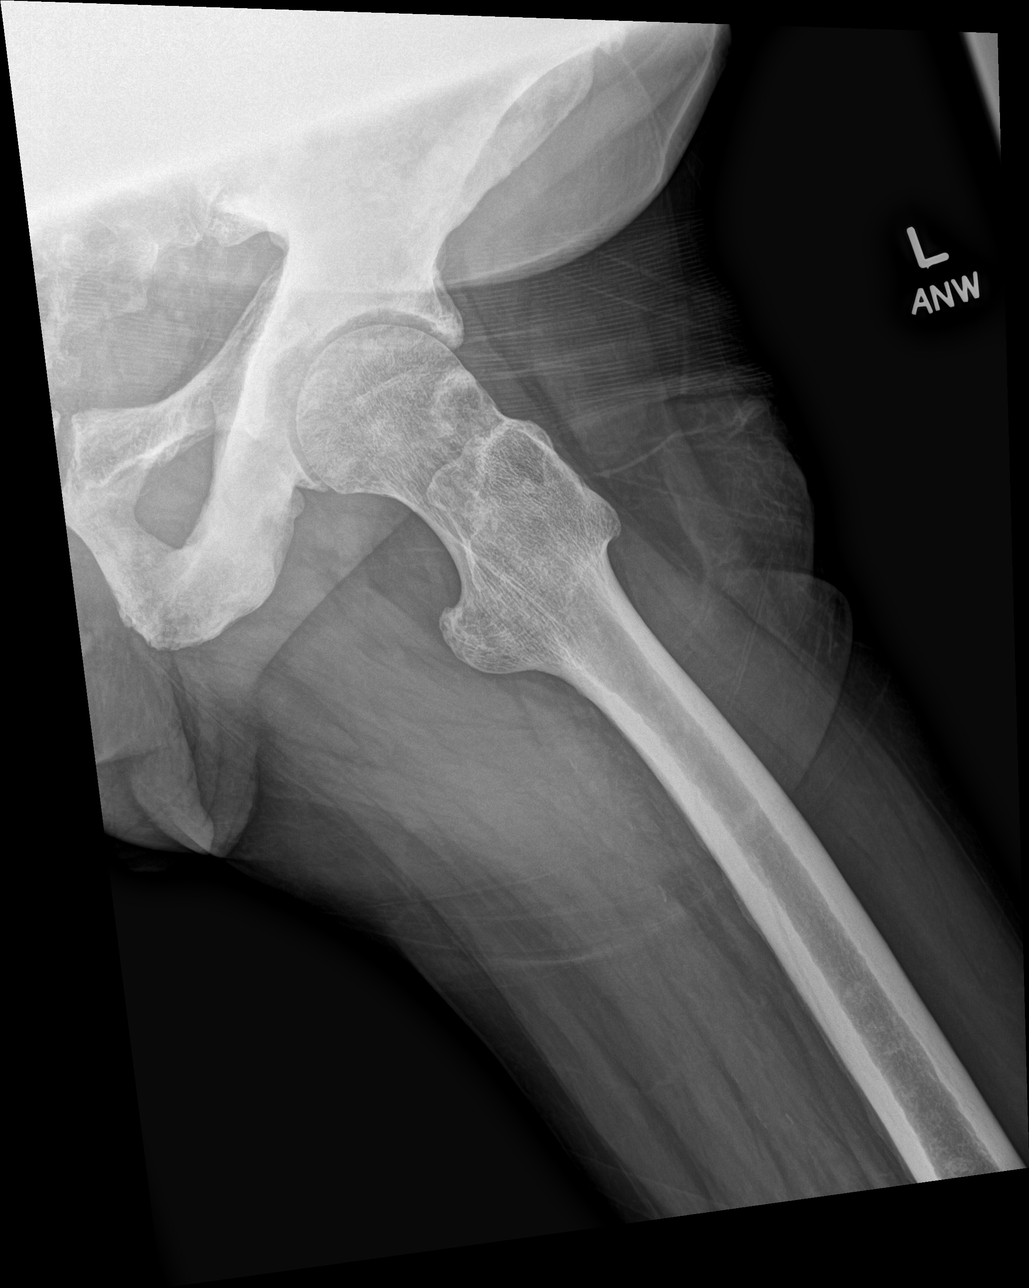

[3 of 3 positions shown; findings below may reference images not displayed]

FINDINGS: Multifocal sclerotic metastasis throughout the pelvis, confluent
involving the left acetabulum. The additional small sclerotic foci
in the left proximal femur were better appreciated on prior CT, with
small sclerotic foci in the femoral head, neck, and proximal femoral
shaft. There is no evidence of acute or pathologic fracture. The
femoral head is seated in the acetabulum with mild left hip joint
space narrowing and acetabular spurring. No evidence of avascular
necrosis.
IMPRESSION: 1. Multifocal sclerotic metastasis throughout the pelvis, confluent
involving the left acetabulum.
2. No acute or pathologic fracture.
3. Left hip osteoarthritis.

## 2021-05-16 MED ORDER — OXYCODONE HCL 5 MG PO TABS: 5.0000 mg | ORAL_TABLET | Freq: Four times a day (QID) | ORAL | 0 refills | Status: DC | PRN

## 2021-05-16 NOTE — Progress Notes (Signed)
[facility] OFFICE PROGRESS NOTE   Diagnosis: Prostate cancer, hepatocellular cancer  INTERVAL HISTORY:   Edgar Mooney returns as scheduled.  He continues abiraterone and prednisone.  He complains of increased pain at the "hip ".  The pain is worse with weightbearing.  He has not been scheduled for Y 90 treatment to the liver.  The pain is not relieved with hydrocodone. He does not have chest pain. Objective:  Vital signs in last 24 hours:  Blood pressure 130/77, pulse 71, temperature 97.8 F (36.6 C), temperature source Oral, resp. rate 20, height $RemoveBe'5\' 2"'cagsxebiZ$  (1.575 m), weight 149 lb (67.6 kg), SpO2 100 %.    Resp: Lungs clear bilaterally Cardio: Regular rate and rhythm GI: No hepatosplenomegaly Vascular: No leg edema Musculoskeletal: Pain with internal/external rotation of the left hip.  No tenderness at the trochanter or pubis  Lab Results:  Lab Results  Component Value Date   WBC 9.6 05/16/2021   HGB 11.0 (L) 05/16/2021   HCT 33.7 (L) 05/16/2021   MCV 90.8 05/16/2021   PLT 205 05/16/2021   NEUTROABS 6.4 05/16/2021    CMP  Lab Results  Component Value Date   NA 138 05/16/2021   K 4.2 05/16/2021   CL 107 05/16/2021   CO2 22 05/16/2021   GLUCOSE 114 (H) 05/16/2021   BUN 22 (H) 05/16/2021   CREATININE 0.52 (L) 05/16/2021   CALCIUM 9.7 05/16/2021   PROT 7.3 05/16/2021   ALBUMIN 4.1 05/16/2021   AST 14 (L) 05/16/2021   ALT 34 05/16/2021   ALKPHOS 101 05/16/2021   BILITOT 0.6 05/16/2021   GFRNONAA >60 05/16/2021   GFRAA 121 12/19/2017    Medications: I have reviewed the patient's current medications.   Assessment/Plan: Metastatic prostate cancer - Lytic bone lesions with retroperitoneal adenopathy concerning for metastatic prostate cancer -03/03/2020-CTA chest/abdomen/pelvis widespread osseous metastatic disease and lower retroperitoneal adenopathy, pathologic right anterior third rib fracture, probable spinal canal tumor at the level of S1, -MRI  cervical, thoracic, and lumbar spine 03/03/2020-diffuse osseous metastatic disease, ventral epidural tumor impinging on right S1 nerve root, extraosseous tumor at the bilateral ilium, asymmetric enhancing material at the right C5-6 canal-right  -03/03/2020-PSA 763 -Degarelix 03/04/2020 -Abiraterone/prednisone 03/14/2020 -Every 63-month Lupron 04/01/2020 -04/01/2020 PSA 36.5 -05/31/2020 PSA 1.2 -06/28/2020 PSA 1.0 -12/14/2020 PSA 6.4 -01/13/2021 PSA 10.6 -04/26/2021 PSA  45.2  2.  Severe anemia-likely secondary to metastatic prostate cancer involving the bones 3.  Mild thrombocytopenia 4.  Cirrhosis with splenomegaly 5.  History of hepatitis C 6.  History of polysubstance abuse 7.  Depression 8.  Tobacco dependence 9.  Right arm weakness, right facial numbness-potentially related to nerve root compromise from metastatic bone lesions 10.  Pain secondary to #1 11.  Extensive bone metastases-every 33-month Zometa starting 04/01/2020 12.  Gout-acute flare right wrist 04/01/2020 treated with indomethacin, allopurinol resumed 13.  Right hepatic lobe mass with elevated AFP concerning for [facility] MRI abdomen 12/21/2020-hypoenhancing inferior right liver mass, segment 6, occluding or compressing the adjacent right portal vein branch, intrahepatic cholangiocarcinoma favored with differential including hepatocellular carcinoma, [facility] M Y 90 planning study 11-22 14.  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 Hayashida has metastatic prostate cancer.  He appears to be developing hormone refractory disease.  The PSA is higher.  He has increased pain in the left pelvis, likely related to progressive  metastatic disease.  He will have a pelvic x-ray and plain x-ray of the hip today.  I will refer him to radiation oncology to consider  palliative radiation.  I discussed systemic treatment options with Edgar. Cadavid.  We specifically discussed docetaxel therapy.  He may be a candidate for a PARP inhibitor or radio immunotherapy in the future.  He will return for an office visit and further discussion next week.  I prescribed oxycodone to use as needed for pain.  Betsy Coder, MD  05/16/2021  12:14 PM

## 2021-05-16 NOTE — Progress Notes (Signed)
Provided patient a case of Ensure

## 2021-05-17 ENCOUNTER — Other Ambulatory Visit: Payer: Self-pay

## 2021-05-17 LAB — PROSTATE-SPECIFIC AG, SERUM (LABCORP): Prostate Specific Ag, Serum: 70 ng/mL — ABNORMAL HIGH (ref 0.0–4.0)

## 2021-05-23 ENCOUNTER — Encounter: Payer: Self-pay | Admitting: Nurse Practitioner

## 2021-05-23 ENCOUNTER — Other Ambulatory Visit: Payer: Self-pay | Admitting: Oncology

## 2021-05-23 ENCOUNTER — Telehealth: Payer: Self-pay | Admitting: Radiation Oncology

## 2021-05-23 ENCOUNTER — Other Ambulatory Visit: Payer: Self-pay

## 2021-05-23 ENCOUNTER — Inpatient Hospital Stay (HOSPITAL_BASED_OUTPATIENT_CLINIC_OR_DEPARTMENT_OTHER): Payer: Medicaid Other | Admitting: Nurse Practitioner

## 2021-05-23 VITALS — BP 150/90 | HR 79 | Temp 97.9°F | Resp 20 | Ht 62.0 in | Wt 152.6 lb

## 2021-05-23 DIAGNOSIS — F17200 Nicotine dependence, unspecified, uncomplicated: Secondary | ICD-10-CM | POA: Insufficient documentation

## 2021-05-23 DIAGNOSIS — D696 Thrombocytopenia, unspecified: Secondary | ICD-10-CM | POA: Insufficient documentation

## 2021-05-23 DIAGNOSIS — Z5111 Encounter for antineoplastic chemotherapy: Secondary | ICD-10-CM | POA: Insufficient documentation

## 2021-05-23 DIAGNOSIS — C220 Liver cell carcinoma: Secondary | ICD-10-CM | POA: Insufficient documentation

## 2021-05-23 DIAGNOSIS — R531 Weakness: Secondary | ICD-10-CM | POA: Insufficient documentation

## 2021-05-23 DIAGNOSIS — Z5189 Encounter for other specified aftercare: Secondary | ICD-10-CM | POA: Insufficient documentation

## 2021-05-23 DIAGNOSIS — Z7189 Other specified counseling: Secondary | ICD-10-CM | POA: Diagnosis not present

## 2021-05-23 DIAGNOSIS — D649 Anemia, unspecified: Secondary | ICD-10-CM | POA: Insufficient documentation

## 2021-05-23 DIAGNOSIS — C7951 Secondary malignant neoplasm of bone: Secondary | ICD-10-CM | POA: Insufficient documentation

## 2021-05-23 DIAGNOSIS — D732 Chronic congestive splenomegaly: Secondary | ICD-10-CM | POA: Insufficient documentation

## 2021-05-23 DIAGNOSIS — C61 Malignant neoplasm of prostate: Secondary | ICD-10-CM | POA: Diagnosis not present

## 2021-05-23 DIAGNOSIS — M109 Gout, unspecified: Secondary | ICD-10-CM | POA: Insufficient documentation

## 2021-05-23 DIAGNOSIS — F32A Depression, unspecified: Secondary | ICD-10-CM | POA: Insufficient documentation

## 2021-05-23 DIAGNOSIS — Z510 Encounter for antineoplastic radiation therapy: Secondary | ICD-10-CM | POA: Insufficient documentation

## 2021-05-23 DIAGNOSIS — Z8619 Personal history of other infectious and parasitic diseases: Secondary | ICD-10-CM | POA: Insufficient documentation

## 2021-05-23 NOTE — Progress Notes (Signed)
[place] OFFICE PROGRESS NOTE   Diagnosis: Prostate cancer, hepatocellular cancer  INTERVAL HISTORY:   Mr. Lichtenwalner returns as scheduled.  He continues abiraterone and prednisone.  He reports continued pain at the left hip.  Pain is "constant".  He is taking oxycodone about 3 times a day.  No constipation.  No nausea or vomiting.  Objective:  Vital signs in last 24 hours:  Blood pressure (!) 150/90, pulse 79, temperature 97.9 F (36.6 C), temperature source Oral, resp. rate 20, height $RemoveBe'5\' 2"'rStZsEjQs$  (1.575 m), weight 152 lb 9.6 oz (69.2 kg), SpO2 100 %.     Resp: Lungs clear bilaterally. Cardio: Regular rate and rhythm. GI: Abdomen soft and nontender.  No hepatomegaly. Vascular: No leg edema.   Lab Results:  Lab Results  Component Value Date   WBC 9.6 05/16/2021   HGB 11.0 (L) 05/16/2021   HCT 33.7 (L) 05/16/2021   MCV 90.8 05/16/2021   PLT 205 05/16/2021   NEUTROABS 6.4 05/16/2021    Imaging:  No results found.  Medications: I have reviewed the patient's current medications.  Assessment/Plan: Metastatic prostate cancer - Lytic bone lesions with retroperitoneal adenopathy concerning for metastatic prostate cancer -03/03/2020-CTA chest/abdomen/pelvis widespread osseous metastatic disease and lower retroperitoneal adenopathy, pathologic right anterior third rib fracture, probable spinal canal tumor at the level of S1, -MRI cervical, thoracic, and lumbar spine 03/03/2020-diffuse osseous metastatic disease, ventral epidural tumor impinging on right S1 nerve root, extraosseous tumor at the bilateral ilium, asymmetric enhancing material at the right C5-6 canal-right  -03/03/2020-PSA 763 -Degarelix 03/04/2020 -Abiraterone/prednisone 03/14/2020 -Every 81-month Lupron 04/01/2020 -04/01/2020 PSA 36.5 -05/31/2020 PSA 1.2 -06/28/2020 PSA 1.0 -12/14/2020 PSA 6.4 -01/13/2021 PSA 10.6 -04/26/2021 PSA  45.2 -05/16/2021 PSA 70 -Abiraterone/prednisone discontinued  05/16/2021 -05/16/2021 left hip x-ray-multifocal sclerotic metastasis throughout the pelvis, confluent involving the left acetabulum.  No acute or pathologic fracture.  Left hip osteoarthritis.   2.  Severe anemia-likely secondary to metastatic prostate cancer involving the bones 3.  Mild thrombocytopenia 4.  Cirrhosis with splenomegaly 5.  History of hepatitis C 6.  History of polysubstance abuse 7.  Depression 8.  Tobacco dependence 9.  Right arm weakness, right facial numbness-potentially related to nerve root compromise from metastatic bone lesions 10.  Pain secondary to #1 11.  Extensive bone metastases-every 23-month Zometa starting 04/01/2020 12.  Gout-acute flare right wrist 04/01/2020 treated with indomethacin, allopurinol resumed 13.  Right hepatic lobe mass with elevated AFP concerning for [facility] MRI abdomen 12/21/2020-hypoenhancing inferior right liver mass, segment 6, occluding or compressing the adjacent right portal vein branch, intrahepatic cholangiocarcinoma favored with differential including hepatocellular carcinoma, Li-Rads M Y 90 planning study 11-22 14.  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 Coluccio appears unchanged.  The PSA is higher and he is having more pain.  We made a referral to Dr. Sondra Come to consider palliative radiation to the left hip.  Dr. Benay Spice recommends beginning treatment with Taxotere on a 3-week schedule.  We reviewed potential toxicities including bone marrow toxicity, nausea, mouth sores, hair loss, diarrhea, watery eyes, fluid retention, neuropathy, allergic reaction.  He agrees to proceed.  He will attend a chemotherapy education class.  He will return for cycle 1 next week.  Plan for nadir CBC around day 10.  We will see him in follow-up prior to cycle 2 Taxotere.  He  will contact the  office in the interim with any problems.  Patient seen with Dr. Benay Spice.        Ned Card ANP/GNP-BC   05/23/2021  8:50 AM This was a shared visit with Ned Card.  Mr. Novacek has developed hormone refractory metastatic prostate cancer.  The PSA is higher.  A plain x-ray 05/16/2021 confirmed metastatic disease involving the pelvic bones including the left acetabulum.  We will refer Mr. Duquette to Dr. Sondra Come to consider palliative radiation.  I discussed systemic treatment options with him again today.  I recommend treatment with docetaxel.  We reviewed potential toxicities associated with docetaxel including the chance of hematologic infection, bleeding, infection, alopecia, and mucositis.  He agrees to proceed.  A chemotherapy plan was entered today.  He is being scheduled for Y90 therapy to treat the right liver hepatocellular carcinoma.  We can coordinate a treatment schedule with interventional radiology.  I think it is more important to begin treatment for the symptomatic prostate cancer now.  I was present for greater than 50% of today's visit.  I performed a medical stage making.  Julieanne Manson, MD

## 2021-05-23 NOTE — Progress Notes (Signed)
START ON PATHWAY REGIMEN - Prostate ? ? ?  A cycle is every 21 days: ?    Prednisone  ?    Docetaxel  ? ?**Always confirm dose/schedule in your pharmacy ordering system** ? 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 ?Intent of Therapy: ?Non-Curative / Palliative Intent, Discussed with Patient ?

## 2021-05-23 NOTE — Telephone Encounter (Signed)
Called patient to schedule consultation with Dr. Sondra Come. No answer, VM full.

## 2021-05-24 NOTE — Progress Notes (Signed)
Histology and Location of Primary Cancer: prostate  Sites of Visceral and Bony Metastatic Disease: left hip  Location(s) of Symptomatic Metastases: left hip  Past/Anticipated chemotherapy by medical oncology, if any:  -Degarelix 03/04/2020 -Abiraterone/prednisone 03/14/2020 discontinued 05/16/2021 -Every 63-month Lupron 04/01/2020  Pain on a scale of 0-10 is: 7/10 left hip pain   If Spine Met(s), symptoms, if any, include: Bowel/Bladder retention or incontinence (please describe): denies Numbness or weakness in extremities (please describe): mild weakness in legs Current Decadron regimen, if applicable: none  Ambulatory status? Walker? Wheelchair?: Ambulatory  SAFETY ISSUES: Prior radiation? no Pacemaker/ICD? no Possible current pregnancy? no Is the patient on methotrexate? no  Current Complaints / other details:  fatigue, poor sleep    Vitals:   05/30/21 1000  BP: (!) 155/91  Pulse: 82  Resp: 18  Temp: (!) 97.4 F (36.3 C)  TempSrc: Temporal  SpO2: 100%  Weight: 154 lb 6 oz (70 kg)  Height: $Remove'5\' 2"'eHcPHpO$  (1.575 m)

## 2021-05-25 ENCOUNTER — Telehealth: Payer: Self-pay

## 2021-05-25 NOTE — Telephone Encounter (Signed)
Patient called and left a voicemail stated he need a refill on oxycodone. Called and spoke with the patient about his late refill of oxycodone on 05/16/21. He stated he have several pills left, and he was getting low. Advice the patient its too early to call  in the prescription. I also went over his next appointment and mailed out a calender with his appointment dates. Patient voiced understanding of instruction and had no further questions or concerns at this time.

## 2021-05-26 ENCOUNTER — Other Ambulatory Visit: Payer: Self-pay | Admitting: Nurse Practitioner

## 2021-05-26 DIAGNOSIS — C61 Malignant neoplasm of prostate: Secondary | ICD-10-CM

## 2021-05-26 MED ORDER — OXYCODONE HCL 5 MG PO TABS: 5.0000 mg | ORAL_TABLET | Freq: Four times a day (QID) | ORAL | 0 refills | Status: DC | PRN

## 2021-05-28 ENCOUNTER — Other Ambulatory Visit: Payer: Self-pay | Admitting: Oncology

## 2021-05-30 ENCOUNTER — Ambulatory Visit: Payer: Medicaid Other | Admitting: Radiation Oncology

## 2021-05-30 ENCOUNTER — Encounter: Payer: Self-pay | Admitting: Radiation Oncology

## 2021-05-30 ENCOUNTER — Ambulatory Visit
Admission: RE | Admit: 2021-05-30 | Discharge: 2021-05-30 | Disposition: A | Discharge status: Home / Self Care | Payer: BC Managed Care – PPO | Source: Ambulatory Visit | Attending: Radiation Oncology | Admitting: Radiation Oncology

## 2021-05-30 VITALS — BP 155/91 | HR 82 | Temp 97.4°F | Resp 18 | Ht 62.0 in | Wt 154.4 lb

## 2021-05-30 DIAGNOSIS — C7951 Secondary malignant neoplasm of bone: Secondary | ICD-10-CM | POA: Insufficient documentation

## 2021-05-30 DIAGNOSIS — C61 Malignant neoplasm of prostate: Secondary | ICD-10-CM | POA: Insufficient documentation

## 2021-05-30 DIAGNOSIS — F1410 Cocaine abuse, uncomplicated: Secondary | ICD-10-CM | POA: Insufficient documentation

## 2021-05-30 DIAGNOSIS — F17210 Nicotine dependence, cigarettes, uncomplicated: Secondary | ICD-10-CM | POA: Insufficient documentation

## 2021-05-30 DIAGNOSIS — R160 Hepatomegaly, not elsewhere classified: Secondary | ICD-10-CM | POA: Diagnosis not present

## 2021-05-30 DIAGNOSIS — Z1509 Genetic susceptibility to other malignant neoplasm: Secondary | ICD-10-CM | POA: Insufficient documentation

## 2021-05-30 DIAGNOSIS — Z7982 Long term (current) use of aspirin: Secondary | ICD-10-CM | POA: Diagnosis not present

## 2021-05-30 DIAGNOSIS — I25119 Atherosclerotic heart disease of native coronary artery with unspecified angina pectoris: Secondary | ICD-10-CM | POA: Insufficient documentation

## 2021-05-30 DIAGNOSIS — M1612 Unilateral primary osteoarthritis, left hip: Secondary | ICD-10-CM | POA: Diagnosis not present

## 2021-05-30 DIAGNOSIS — B182 Chronic viral hepatitis C: Secondary | ICD-10-CM | POA: Diagnosis not present

## 2021-05-30 DIAGNOSIS — Z79899 Other long term (current) drug therapy: Secondary | ICD-10-CM | POA: Insufficient documentation

## 2021-05-30 NOTE — Progress Notes (Signed)
Pharmacist Chemotherapy Monitoring - Initial Assessment    Anticipated start date: 05/31/21   The following has been reviewed per standard work regarding the patient's treatment regimen: The patient's diagnosis, treatment plan and drug doses, and organ/hematologic function Lab orders and baseline tests specific to treatment regimen  The treatment plan start date, drug sequencing, and pre-medications Prior authorization status  Patient's documented medication list, including drug-drug interaction screen and prescriptions for anti-emetics and supportive care specific to the treatment regimen The drug concentrations, fluid compatibility, administration routes, and timing of the medications to be used The patient's access for treatment and lifetime cumulative dose history, if applicable  The patient's medication allergies and previous infusion related reactions, if applicable   Changes made to treatment plan:  N/A  Follow up needed:  N/A   Patrica Duel, RPH, 05/30/2021  11:29 AM

## 2021-05-30 NOTE — Progress Notes (Signed)
See MD note for nursing evaluation. °

## 2021-05-30 NOTE — Progress Notes (Signed)
Radiation Oncology         [phone] ________________________________  Initial Outpatient Consultation  Name: Edgar Mooney MRN: [id number]  Date: 05/30/2021  DOB: 10/05/1965  WU:[id number], No Pcp Per (Inactive)  Ladell Pier, MD   REFERRING PHYSICIAN: Ladell Pier, MD  DIAGNOSIS: The encounter diagnosis was Prostate cancer [facility]).  Metastatic prostate cancer; widespread osseous metastatic disease involving the lumbar spine and pelvis  HISTORY OF PRESENT ILLNESS::Edgar Mooney is a 56 y.o. male who is seen as a courtesy of Dr. Benay Spice for an opinion concerning radiation therapy as part of management for his diagnosed metastatic prostate cancer.   The patient initially presented to the ED on 03/03/2020 with worsening neck pain, back and hip pain, and worsening of generalized weakness. Patient reported symptoms to have started 3 months prior, only with aching neck pain, which then gradually developed into shooting pain down the right arm to all 5 fingers. He then later developed low back pain and bilateral hip pain. He also reported weight loss of more than 50 pounds compared to 2020, and trouble urinating x 1 month.  CT of the abdomen pelvis performed for evaluation on this date showed possible right osseous metastatic disease and lower retroperitoneal adenopathy, suspicious for metastatic prostate cancer. CT specifically showed:  widespread osseous metastatic disease and lower retroperitoneal adenopathy, pathologic right anterior third rib fracture, and probable spinal canal tumor at the level of S1. MRI of the cervical, thoracic, and lumbar spine on this same date again showed diffuse osseous metastatic disease; ventral epidural tumor impinging on right S1 nerve root; extraosseous tumor at the bilateral ilium; and asymmetric enhancing material at the right C5-6 canal. He was accordingly admitted for evaluation and management, primarily for anemia likely due to metastatic disease. Patient  received 2 units of PRBC during hospital course and evaluated by GI and neurosurgery and was started on Flomax. He was seen by oncology while admitted and was initiated on hormone therapy. He was discharged home on 03/07/20 with pain medications and instructions for OP follow up with oncology.   The patient accordingly established OP care with Dr. Learta Codding on 03/14/20. Per encounter notes, the patient received degarelix in the hospital on 03/04/2020.  Dr. Benay Spice further prescribed Lupron every 3-4 months at a 1 month interval from the degarelix injection,  Zytiga/prednisone, and Zometa. His main complaint was of pain which had slowly improved since his admission with percocet.  The patient continued follow up visits with Dr. Learta Codding in the coming months. During which time, he reported ongoing intermittent body pain, poor appetite, and poor energy levels. As he continued on abiraterone/prednisone, and zometa and lupron every 3 months, his appetite and pain slowly improved.   He was seen in the emergency room on 12/10/2020 with right upper abdominal pain. CT performed at this time revealed a right liver mass. Per Dr. Benay Spice on 12/14/20, the mass was suspected to be related to hepatocellular carcinoma. Accordingly, Dr. Benay Spice ordered further workup via MRI to better characterize the mass.   MRI of the liver on 12/21/20 showed a large, contrast hypoenhancing mass of the inferior right lobe of the liver, hepatic segment VI, measuring 9.4 x 8.7 cm, which appeared to occlude or at least compress the adjacent branch right portal veins, without definite evidence of tumor thrombus. Appearance and contrast enhancement characteristics were noted to favor intrahepatic cholangiocarcinoma, although hepatocellular carcinoma remained a differential consideration, particularly in the high risk setting of chronic hepatitis C. Prostate metastatic disease  was not favored as a consideration.   Repeat MRI of the cervical,  thoracic, and lumbar spine on 12/19/20 showed interval improvement in widespread osseous metastatic disease involving the lumbar spine and visualized pelvis. No pathologic fractures were appreciated. The previously seen epidural tumor at the level of S1 was seen to have resolved as well as the previously seen extraosseous tumor extension from the bilateral ilium. No significant epidural or extraosseous disease was otherwise appreciated. Of note: imaging was performed while the patient was admitted for angina and NSTEMI in the setting of cocaine abuse.   He subsequently underwent catheterization on 12/21/2020 which revealed severe multivessel CAD, 99% proximal LAD stenosis. LAD stent was placed.  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   He later underwent a hepatic angiogram and shunt fraction study for Y 90 treatment on 02/15/21. Hepatic angiogram revealed a hypervascular mass in the right hepatic lobe, consistent with hepatocellular carcinoma. He was scheduled to have the Y 90 therapy 2 weeks later however he was incarcerated a few days after this procedure and remained incarcerated during subsequent follow up visits.  During a follow up visit with Dr. Learta Codding on 04/04/21, the patient's PSA were found to have increased over the past several months. Findings were overall concerning for progressive prostate cancer. If his PSA continued to trend higher, Dr. Learta Codding recommended changing to a different systemic therapy, likely a PARP inhibitor. The patient was also noted to endorse weight loss due to not liking the prison food. Given that he was incarcerated, he was not allowed to have narcotic pain medication at the detention center and reported that Tylenol plus ibuprofen was effective for his pain.  During a follow up visit with Dr. Learta Codding on 05/16/21, the patient reported increased left hip pain, especially with weightbearing. Labs performed  showed continued increase of PSA. His left pelvic pain was again noted as likely related to his progressive metastatic disease. Abiraterone and prednisone were discontinued on this date, and given his progressive pain, Dr. Learta Codding referred the patient to me for consideration of palliative radiation therapy. Systemic treatment options were also discussed with the patient including docetaxel and Taxotere. Per. Dr. Learta Codding, the patient has yet to be scheduled for Y 90 treatment to the liver. (For pain, the patient is currently taking oxycodone about 3 times a day).  Left hip x-ray ordered by Dr. Benay Spice on 05/16/21 showed multifocal sclerotic metastasis throughout the pelvis, confluent and involving the left acetabulum.  No acute or pathologic fractures were appreciated. Other findings included left hip osteoarthritis.  During the patient's most recent follow up with Dr. Learta Codding on 05/23/20, the patient agreed to proceeding with systemic treatment consisting of taxotere. The patient will return to Dr. Learta Codding following cycle 2 of taxotere.   Of note: The patient has a history of cocaine and heroin abuse. He also has a history of alcohol abuse; 126.0 standard drinks per week. He does not currently use alcohol.    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    PAST SURGICAL HISTORY: Past Surgical History:  Procedure Laterality Date   CORONARY STENT INTERVENTION N/A 12/21/2020   Procedure: CORONARY STENT INTERVENTION;  Surgeon: Shelva Majestic  A, MD;  Location: [place] [facility];  Service: Cardiovascular;  Laterality: N/A;   CORONARY STENT INTERVENTION N/A 12/22/2020   Procedure: CORONARY STENT INTERVENTION;  Surgeon: Burnell Blanks, MD;  Location: [place] [facility];  Service:  Cardiovascular;  Laterality: N/A;   IR 3D INDEPENDENT WKST  02/15/2021   IR ANGIOGRAM SELECTIVE EACH ADDITIONAL VESSEL  02/15/2021   IR ANGIOGRAM SELECTIVE EACH ADDITIONAL VESSEL  02/15/2021   IR ANGIOGRAM VISCERAL SELECTIVE  02/15/2021   IR ANGIOGRAM VISCERAL SELECTIVE  02/15/2021   IR RADIOLOGIST EVAL & MGMT  01/11/2021   IR US GUIDE VASC ACCESS RIGHT  02/15/2021   LEFT HEART CATH AND CORONARY ANGIOGRAPHY N/A 12/21/2020   Procedure: LEFT HEART CATH AND CORONARY ANGIOGRAPHY;  Surgeon: Troy Sine, MD;  Location: [place] [facility];  Service: Cardiovascular;  Laterality: N/A;    FAMILY HISTORY:  Family History  Problem Relation Age of Onset   Cancer Mother    Cancer Father     SOCIAL HISTORY:  Social History   Tobacco Use   Smoking status: Every Day    Packs/day: 1.00    Years: 25.00    Pack years: 25.00    Types: Cigarettes   Smokeless tobacco: Never  Substance Use Topics   Alcohol use: Not Currently    Alcohol/week: 126.0 standard drinks    Types: 126 Cans of beer per week    Comment: 18 pack per day   Drug use: Yes    Types: Cocaine, IV, Heroin, Marijuana    Comment: Used crack and cocaine 04/02/16    ALLERGIES: No Known Allergies  MEDICATIONS:  Current Outpatient Medications  Medication Sig Dispense Refill   allopurinol (ZYLOPRIM) 100 MG tablet TAKE 1 TABLET BY MOUTH EVERY DAY START ON 12/24 90 tablet 1   amLODipine (NORVASC) 5 MG tablet Take 1 tablet (5 mg total) by mouth daily. 90 tablet 3   aspirin 81 MG chewable tablet Chew 1 tablet (81 mg total) by mouth daily. 90 tablet 3   indomethacin (INDOCIN) 50 MG capsule Take 1 capsule (50 mg total) by mouth 3 (three) times daily with meals. For gout flare 15 capsule 0   nitroGLYCERIN (NITROSTAT) 0.4 MG SL tablet Place 1 tablet (0.4 mg total) under the tongue every 5 (five) minutes as needed for chest pain. 25 tablet 12   oxyCODONE (OXY IR/ROXICODONE) 5 MG immediate release tablet Take 1 tablet (5 mg total) by mouth every 6  (six) hours as needed for severe pain. 60 tablet 0   rosuvastatin (CRESTOR) 20 MG tablet Take 1 tablet (20 mg total) by mouth daily. 90 tablet 0   tamsulosin (FLOMAX) 0.4 MG CAPS capsule Take 1 capsule (0.4 mg total) by mouth daily. 90 capsule 0   ticagrelor (BRILINTA) 90 MG TABS tablet Take 1 tablet (90 mg total) by mouth 2 (two) times daily. 120 tablet 3   ferrous gluconate (FERGON) 324 MG tablet Take 1 tablet (324 mg total) by mouth 2 (two) times daily with a meal. (Patient not taking: Reported on 05/16/2021) 30 tablet 3   ondansetron (ZOFRAN) 4 MG tablet Take 1 tablet (4 mg total) by mouth every 6 (six) hours as needed for nausea. (Patient not taking: Reported on 05/16/2021) 20 tablet 0   No current facility-administered medications for this encounter.    REVIEW OF SYSTEMS:  A 10+ POINT REVIEW OF SYSTEMS WAS OBTAINED including neurology, dermatology, psychiatry, cardiac, respiratory, lymph, extremities, GI, GU, musculoskeletal, constitutional, reproductive, HEENT.  The  points to pain along the left pelvis region.  This is exasperated with standing and walking.  He denies any weakness along his left lower extremity or numbness.   PHYSICAL EXAM:  height is $RemoveB'5\' 2"'QhyfPNMm$  (1.575 m) and weight is 154 lb 6 oz (70 kg). His temporal temperature is 97.4 F (36.3 C) (abnormal). His blood pressure is 155/91 (abnormal) and his pulse is 82. His respiration is 18 and oxygen saturation is 100%.   General: Alert and oriented, in no acute distress HEENT: Head is normocephalic. Extraocular movements are intact.  Neck: Neck is supple, no palpable cervical or supraclavicular lymphadenopathy. Heart: Regular in rate and rhythm with no murmurs, rubs, or gallops. Chest: Clear to auscultation bilaterally, with no rhonchi, wheezes, or rales. Abdomen: Soft, nontender, nondistended, with no rigidity or guarding. Extremities: No cyanosis or edema. Lymphatics: see Neck Exam Skin: No concerning lesions. Musculoskeletal: symmetric  strength and muscle tone throughout. Neurologic: Cranial nerves II through XII are grossly intact. No obvious focalities. Speech is fluent. Coordination is intact. Psychiatric: Judgment and insight are intact. Affect is appropriate.   ECOG = 1  0 - Asymptomatic (Fully active, able to carry on all predisease activities without restriction)  1 - Symptomatic but completely ambulatory (Restricted in physically strenuous activity but ambulatory and able to carry out work of a light or sedentary nature. For example, light housework, office work)  2 - Symptomatic, <50% in bed during the day (Ambulatory and capable of all self care but unable to carry out any work activities. Up and about more than 50% of waking hours)  3 - Symptomatic, >50% in bed, but not bedbound (Capable of only limited self-care, confined to bed or chair 50% or more of waking hours)  4 - Bedbound (Completely disabled. Cannot carry on any self-care. Totally confined to bed or chair)  5 - Death   Eustace Pen MM, Creech RH, Tormey DC, et al. [phone]). "Toxicity and response criteria of the [facility]". [place] Oncol. 5 (6): 649-55  LABORATORY DATA:  Lab Results  Component Value Date   WBC 9.6 05/16/2021   HGB 11.0 (L) 05/16/2021   HCT 33.7 (L) 05/16/2021   MCV 90.8 05/16/2021   PLT 205 05/16/2021   NEUTROABS 6.4 05/16/2021   Lab Results  Component Value Date   NA 138 05/16/2021   K 4.2 05/16/2021   CL 107 05/16/2021   CO2 22 05/16/2021   GLUCOSE 114 (H) 05/16/2021   CREATININE 0.52 (L) 05/16/2021   CALCIUM 9.7 05/16/2021      RADIOGRAPHY: DG HIP UNILAT WITH PELVIS 2-3 VIEWS LEFT  Result Date: 05/17/2021 CLINICAL DATA:  Prostate cancer.  Left hip pain. EXAM: DG HIP (WITH OR WITHOUT PELVIS) 2-3V LEFT COMPARISON:  Abdominopelvic CT 11/20/2020. FINDINGS: Multifocal sclerotic metastasis throughout the pelvis, confluent involving the left acetabulum. The additional small sclerotic foci in the left  proximal femur were better appreciated on prior CT, with small sclerotic foci in the femoral head, neck, and proximal femoral shaft. There is no evidence of acute or pathologic fracture. The femoral head is seated in the acetabulum with mild left hip joint space narrowing and acetabular spurring. No evidence of avascular necrosis. IMPRESSION: 1. Multifocal sclerotic metastasis throughout the pelvis, confluent involving the left acetabulum. 2. No acute or pathologic fracture. 3. Left hip osteoarthritis. Electronically Signed   By: Keith Rake M.D.   On: 05/17/2021 11:08      IMPRESSION: Metastatic prostate cancer; widespread osseous metastatic disease involving  the lumbar spine and pelvis  He is quite symptomatic from his diffuse sclerotic lesion along the left acetabular area.  He would be at good candidate for short course of palliative radiation therapy directed at this area. .  We discussed the available radiation techniques, and focused on the details of logistics and delivery.  We reviewed the anticipated acute and late sequelae associated with radiation in this setting.  The patient was encouraged to ask questions that I answered to the best of my ability.  A patient consent form was discussed and signed.  We retained a copy for our records.  The patient would like to proceed with radiation and will be scheduled for CT simulation.  PLAN: He will return for CT simulation on February 16 with treatments to begin soon afterward.  Anticipate between 5 and 8 treatments directed at the left acetabular region.   60 minutes of total time was spent for this patient encounter, including preparation, face-to-face counseling with the patient and coordination of care, physical exam, and documentation of the encounter.   ------------------------------------------------  Blair Promise, PhD, MD  This document serves as a record of services personally performed by Gery Pray, MD. It was created on his  behalf by Roney Mans, a trained medical scribe. The creation of this record is based on the scribe's personal observations and the provider's statements to them. This document has been checked and approved by the attending provider.

## 2021-05-31 ENCOUNTER — Other Ambulatory Visit: Payer: Self-pay

## 2021-05-31 ENCOUNTER — Encounter: Payer: Self-pay | Admitting: Licensed Clinical Social Worker

## 2021-05-31 ENCOUNTER — Other Ambulatory Visit: Payer: Self-pay | Admitting: *Deleted

## 2021-05-31 ENCOUNTER — Inpatient Hospital Stay: Payer: Medicaid Other

## 2021-05-31 ENCOUNTER — Ambulatory Visit: Payer: Self-pay

## 2021-05-31 ENCOUNTER — Inpatient Hospital Stay: Payer: Medicaid Other | Admitting: *Deleted

## 2021-05-31 ENCOUNTER — Encounter: Payer: Self-pay | Admitting: *Deleted

## 2021-05-31 ENCOUNTER — Other Ambulatory Visit: Payer: Self-pay | Admitting: Nurse Practitioner

## 2021-05-31 VITALS — BP 147/77 | HR 88 | Temp 99.7°F | Resp 18 | Ht 62.0 in | Wt 152.3 lb

## 2021-05-31 DIAGNOSIS — C61 Malignant neoplasm of prostate: Secondary | ICD-10-CM

## 2021-05-31 DIAGNOSIS — Z510 Encounter for antineoplastic radiation therapy: Secondary | ICD-10-CM | POA: Diagnosis not present

## 2021-05-31 DIAGNOSIS — C7951 Secondary malignant neoplasm of bone: Secondary | ICD-10-CM | POA: Diagnosis not present

## 2021-05-31 DIAGNOSIS — Z5111 Encounter for antineoplastic chemotherapy: Secondary | ICD-10-CM | POA: Diagnosis not present

## 2021-05-31 LAB — CBC WITH DIFFERENTIAL (CANCER CENTER ONLY)
Abs Immature Granulocytes: 0.08 10*3/uL — ABNORMAL HIGH (ref 0.00–0.07)
Basophils Absolute: 0.1 10*3/uL (ref 0.0–0.1)
Basophils Relative: 1 %
Eosinophils Absolute: 0.2 10*3/uL (ref 0.0–0.5)
Eosinophils Relative: 1 %
HCT: 33.9 % — ABNORMAL LOW (ref 39.0–52.0)
Hemoglobin: 11.4 g/dL — ABNORMAL LOW (ref 13.0–17.0)
Immature Granulocytes: 1 %
Lymphocytes Relative: 14 %
Lymphs Abs: 1.6 10*3/uL (ref 0.7–4.0)
MCH: 29.5 pg (ref 26.0–34.0)
MCHC: 33.6 g/dL (ref 30.0–36.0)
MCV: 87.6 fL (ref 80.0–100.0)
Monocytes Absolute: 1.2 10*3/uL — ABNORMAL HIGH (ref 0.1–1.0)
Monocytes Relative: 11 %
Neutro Abs: 7.9 10*3/uL — ABNORMAL HIGH (ref 1.7–7.7)
Neutrophils Relative %: 72 %
Platelet Count: 178 10*3/uL (ref 150–400)
RBC: 3.87 MIL/uL — ABNORMAL LOW (ref 4.22–5.81)
RDW: 15.3 % (ref 11.5–15.5)
WBC Count: 10.9 10*3/uL — ABNORMAL HIGH (ref 4.0–10.5)
nRBC: 0 % (ref 0.0–0.2)

## 2021-05-31 LAB — CMP (CANCER CENTER ONLY)
ALT: 19 U/L (ref 0–44)
AST: 17 U/L (ref 15–41)
Albumin: 4.3 g/dL (ref 3.5–5.0)
Alkaline Phosphatase: 104 U/L (ref 38–126)
Anion gap: 11 (ref 5–15)
BUN: 13 mg/dL (ref 6–20)
CO2: 20 mmol/L — ABNORMAL LOW (ref 22–32)
Calcium: 9.1 mg/dL (ref 8.9–10.3)
Chloride: 103 mmol/L (ref 98–111)
Creatinine: 0.55 mg/dL — ABNORMAL LOW (ref 0.61–1.24)
GFR, Estimated: >60 mL/min (ref >60)
Glucose, Bld: 99 mg/dL (ref 70–99)
Potassium: 4.1 mmol/L (ref 3.5–5.1)
Sodium: 134 mmol/L — ABNORMAL LOW (ref 135–145)
Total Bilirubin: 0.9 mg/dL (ref 0.3–1.2)
Total Protein: 7.7 g/dL (ref 6.5–8.1)

## 2021-05-31 MED ORDER — SODIUM CHLORIDE 0.9 % IV SOLN: Freq: Once | INTRAVENOUS | Status: AC

## 2021-05-31 MED ORDER — ONDANSETRON HCL 8 MG PO TABS: 8.0000 mg | ORAL_TABLET | Freq: Three times a day (TID) | ORAL | 1 refills | Status: DC | PRN

## 2021-05-31 MED ORDER — SODIUM CHLORIDE 0.9 % IV SOLN
60.0000 mg/m2 | Freq: Once | INTRAVENOUS | Status: AC
  Administered 2021-05-31: 100 mg via INTRAVENOUS
  Filled 2021-05-31: qty 10

## 2021-05-31 MED ORDER — PREDNISONE 5 MG PO TABS: 5.0000 mg | ORAL_TABLET | Freq: Two times a day (BID) | ORAL | 2 refills | Status: DC

## 2021-05-31 MED ORDER — SODIUM CHLORIDE 0.9 % IV SOLN
10.0000 mg | Freq: Once | INTRAVENOUS | Status: AC
  Administered 2021-05-31: 10 mg via INTRAVENOUS
  Filled 2021-05-31: qty 1

## 2021-05-31 MED ORDER — PROCHLORPERAZINE MALEATE 10 MG PO TABS: 10.0000 mg | ORAL_TABLET | Freq: Four times a day (QID) | ORAL | 0 refills | Status: DC | PRN

## 2021-05-31 NOTE — Progress Notes (Signed)
Referral for Social Work placed, starting chemo today

## 2021-05-31 NOTE — Patient Instructions (Signed)
Edgar Mooney   Discharge Instructions: Thank you for choosing [facility] to provide your oncology and hematology care.   If you have a lab appointment with the [facility], please go directly to the [facility] and check in at the registration area.   Wear comfortable clothing and clothing appropriate for easy access to any Portacath or PICC line.   We strive to give you quality time with your provider. You may need to reschedule your appointment if you arrive late (15 or more minutes).  Arriving late affects you and other patients whose appointments are after yours.  Also, if you miss three or more appointments without notifying the office, you may be dismissed from the clinic at the providers discretion.      For prescription refill requests, have your pharmacy contact our office and allow 72 hours for refills to be completed.    Today you received the following chemotherapy and/or immunotherapy agents Taxotere      To help prevent nausea and vomiting after your treatment, we encourage you to take your nausea medication as directed.  BELOW ARE SYMPTOMS THAT SHOULD BE REPORTED IMMEDIATELY: *FEVER GREATER THAN 100.4 F (38 C) OR HIGHER *CHILLS OR SWEATING *NAUSEA AND VOMITING THAT IS NOT CONTROLLED WITH YOUR NAUSEA MEDICATION *UNUSUAL SHORTNESS OF BREATH *UNUSUAL BRUISING OR BLEEDING *URINARY PROBLEMS (pain or burning when urinating, or frequent urination) *BOWEL PROBLEMS (unusual diarrhea, constipation, pain near the anus) TENDERNESS IN MOUTH AND THROAT WITH OR WITHOUT PRESENCE OF ULCERS (sore throat, sores in mouth, or a toothache) UNUSUAL RASH, SWELLING OR PAIN  UNUSUAL VAGINAL DISCHARGE OR ITCHING   Items with * indicate a potential emergency and should be followed up as soon as possible or go to the Emergency Department if any problems should occur.  Please show the CHEMOTHERAPY ALERT CARD or IMMUNOTHERAPY ALERT CARD at check-in to the  Emergency Department and triage nurse.  Should you have questions after your visit or need to cancel or reschedule your appointment, please contact [facility]  Dept: [phone]  and follow the prompts.  Office hours are 8:00 a.m. to 4:30 p.m. Monday - Friday. Please note that voicemails left after 4:00 p.m. may not be returned until the following business day.  We are closed weekends and major holidays. You have access to a nurse at all times for urgent questions. Please call the main number to the clinic Dept: [phone] and follow the prompts.   For any non-urgent questions, you may also contact your provider using MyChart. We now offer e-Visits for anyone 24 and older to request care online for non-urgent symptoms. For details visit [email or web address].   Also download the MyChart app! Go to the app store, search "MyChart", open the app, select [facility], and log in with your MyChart username and password.  Due to Covid, a mask is required upon entering the hospital/clinic. If you do not have a mask, one will be given to you upon arrival. For doctor visits, patients may have 1 support person aged 4 or older with them. For treatment visits, patients cannot have anyone with them due to current Covid guidelines and our immunocompromised population.   Docetaxel injection What is this medication? DOCETAXEL (doe se TAX el) is a chemotherapy drug. It targets fast dividing cells, like cancer cells, and causes these cells to die. This medicine is used to treat many types of cancers like breast cancer, certain stomach cancers, head and neck  cancer, lung cancer, and prostate cancer. This medicine may be used for other purposes; ask your health care provider or pharmacist if you have questions. COMMON BRAND NAME(S): Docefrez, Taxotere What should I tell my care team before I take this medication? They need to know if you have any of these conditions: infection  (especially a virus infection such as chickenpox, cold sores, or herpes) liver disease low blood counts, like low white cell, platelet, or red cell counts an unusual or allergic reaction to docetaxel, polysorbate 80, other chemotherapy agents, other medicines, foods, dyes, or preservatives pregnant or trying to get pregnant breast-feeding How should I use this medication? This drug is given as an infusion into a vein. It is administered in a hospital or clinic by a specially trained health care professional. Talk to your pediatrician regarding the use of this medicine in children. Special care may be needed. Overdosage: If you think you have taken too much of this medicine contact a poison control center or emergency room at once. NOTE: This medicine is only for you. Do not share this medicine with others. What if I miss a dose? It is important not to miss your dose. Call your doctor or health care professional if you are unable to keep an appointment. What may interact with this medication? Do not take this medicine with any of the following medications: live virus vaccines This medicine may also interact with the following medications: aprepitant certain antibiotics like erythromycin or clarithromycin certain antivirals for HIV or hepatitis certain medicines for fungal infections like fluconazole, itraconazole, ketoconazole, posaconazole, or voriconazole cimetidine ciprofloxacin conivaptan cyclosporine dronedarone fluvoxamine grapefruit juice imatinib verapamil This list may not describe all possible interactions. Give your health care provider a list of all the medicines, herbs, non-prescription drugs, or dietary supplements you use. Also tell them if you smoke, drink alcohol, or use illegal drugs. Some items may interact with your medicine. What should I watch for while using this medication? Your condition will be monitored carefully while you are receiving this medicine. You  will need important blood work done while you are taking this medicine. Call your doctor or health care professional for advice if you get a fever, chills or sore throat, or other symptoms of a cold or flu. Do not treat yourself. This drug decreases your body's ability to fight infections. Try to avoid being around people who are sick. Some products may contain alcohol. Ask your health care professional if this medicine contains alcohol. Be sure to tell all health care professionals you are taking this medicine. Certain medicines, like metronidazole and disulfiram, can cause an unpleasant reaction when taken with alcohol. The reaction includes flushing, headache, nausea, vomiting, sweating, and increased thirst. The reaction can last from 30 minutes to several hours. You may get drowsy or dizzy. Do not drive, use machinery, or do anything that needs mental alertness until you know how this medicine affects you. Do not stand or sit up quickly, especially if you are an older patient. This reduces the risk of dizzy or fainting spells. Alcohol may interfere with the effect of this medicine. Talk to your health care professional about your risk of cancer. You may be more at risk for certain types of cancer if you take this medicine. Do not become pregnant while taking this medicine or for 6 months after stopping it. Women should inform their doctor if they wish to become pregnant or think they might be pregnant. There is a potential for serious side effects  to an unborn child. Talk to your health care professional or pharmacist for more information. Do not breast-feed an infant while taking this medicine or for 1 week after stopping it. Males who get this medicine must use a condom during sex with females who can get pregnant. If you get a woman pregnant, the baby could have birth defects. The baby could die before they are born. You will need to continue wearing a condom for 3 months after stopping the medicine.  Tell your health care provider right away if your partner becomes pregnant while you are taking this medicine. This may interfere with the ability to father a child. You should talk to your doctor or health care professional if you are concerned about your fertility. What side effects may I notice from receiving this medication? Side effects that you should report to your doctor or health care professional as soon as possible: allergic reactions like skin rash, itching or hives, swelling of the face, lips, or tongue blurred vision breathing problems changes in vision low blood counts - This drug may decrease the number of white blood cells, red blood cells and platelets. You may be at increased risk for infections and bleeding. nausea and vomiting pain, redness or irritation at site where injected pain, tingling, numbness in the hands or feet redness, blistering, peeling, or loosening of the skin, including inside the mouth signs of decreased platelets or bleeding - bruising, pinpoint red spots on the skin, black, tarry stools, nosebleeds signs of decreased red blood cells - unusually weak or tired, fainting spells, lightheadedness signs of infection - fever or chills, cough, sore throat, pain or difficulty passing urine swelling of the ankle, feet, hands Side effects that usually do not require medical attention (report to your doctor or health care professional if they continue or are bothersome): constipation diarrhea fingernail or toenail changes hair loss loss of appetite mouth sores muscle pain This list may not describe all possible side effects. Call your doctor for medical advice about side effects. You may report side effects to FDA at 1-800-FDA-1088. Where should I keep my medication? This drug is given in a hospital or clinic and will not be stored at home. NOTE: This sheet is a summary. It may not cover all possible information. If you have questions about this medicine, talk  to your doctor, pharmacist, or health care provider.  2022 Elsevier/Gold Standard (2020-12-20 00:00:00)

## 2021-05-31 NOTE — Progress Notes (Signed)
Transportation waiver signed and patient enrolled.

## 2021-05-31 NOTE — Progress Notes (Signed)
Patient presents for treatment. RN assessment completed along with the following:  Labs/vitals reviewed - Yes, and within treatment parameters.   Weight within 10% of previous measurement - Yes Informed consent completed and reflects current therapy/intent - Yes, on date 05/31/21             Provider progress note reviewed - Patient not seen by provider today. Most recent note dated 05/23/21 reviewed. Treatment/Antibody/Supportive plan reviewed - Yes, and there are no adjustments needed for today's treatment. S&H and other orders reviewed - Yes, and there are no additional orders identified. Previous treatment date reviewed - Yes, and the appropriate amount of time has elapsed between treatments. Clinic Hand Off Received from - none  Patient to proceed with treatment.

## 2021-05-31 NOTE — Progress Notes (Signed)
[facility] Work  Clinical Social Work was referred by Engineer, site for assessment of psychosocial needs.  Clinical Social Worker contacted patient by phone  to offer support and assess for needs. CSW left voicemail with contact information and request for return call.    Edgar Mooney, Edgar Mooney       First Attempt

## 2021-06-01 ENCOUNTER — Ambulatory Visit
Admission: RE | Admit: 2021-06-01 | Discharge: 2021-06-01 | Disposition: A | Discharge status: Home / Self Care | Payer: Medicaid Other | Source: Ambulatory Visit | Attending: Radiation Oncology | Admitting: Radiation Oncology

## 2021-06-01 ENCOUNTER — Encounter: Payer: Self-pay | Admitting: *Deleted

## 2021-06-01 DIAGNOSIS — Z510 Encounter for antineoplastic radiation therapy: Secondary | ICD-10-CM | POA: Diagnosis not present

## 2021-06-01 DIAGNOSIS — Z5111 Encounter for antineoplastic chemotherapy: Secondary | ICD-10-CM | POA: Diagnosis not present

## 2021-06-01 DIAGNOSIS — C61 Malignant neoplasm of prostate: Secondary | ICD-10-CM | POA: Diagnosis not present

## 2021-06-01 DIAGNOSIS — C7951 Secondary malignant neoplasm of bone: Secondary | ICD-10-CM | POA: Diagnosis not present

## 2021-06-01 LAB — PROSTATE-SPECIFIC AG, SERUM (LABCORP): Prostate Specific Ag, Serum: 113 ng/mL — ABNORMAL HIGH (ref 0.0–4.0)

## 2021-06-01 NOTE — Progress Notes (Signed)
F/U call after first treatment on 2/15 with Taxotere, tolerated well.  Reports having good energy today, eating and drinking well.    Encouraged to call with any issues or questions

## 2021-06-02 ENCOUNTER — Other Ambulatory Visit: Payer: Self-pay

## 2021-06-02 ENCOUNTER — Inpatient Hospital Stay: Payer: Medicaid Other

## 2021-06-02 VITALS — BP 128/78 | HR 87 | Temp 98.4°F | Resp 18

## 2021-06-02 DIAGNOSIS — Z510 Encounter for antineoplastic radiation therapy: Secondary | ICD-10-CM | POA: Diagnosis not present

## 2021-06-02 DIAGNOSIS — C61 Malignant neoplasm of prostate: Secondary | ICD-10-CM | POA: Diagnosis not present

## 2021-06-02 DIAGNOSIS — Z5111 Encounter for antineoplastic chemotherapy: Secondary | ICD-10-CM | POA: Diagnosis not present

## 2021-06-02 DIAGNOSIS — C7951 Secondary malignant neoplasm of bone: Secondary | ICD-10-CM | POA: Diagnosis not present

## 2021-06-02 MED ORDER — PEGFILGRASTIM-BMEZ 6 MG/0.6ML ~~LOC~~ SOSY
6.0000 mg | PREFILLED_SYRINGE | Freq: Once | SUBCUTANEOUS | Status: AC
  Administered 2021-06-02: 6 mg via SUBCUTANEOUS
  Filled 2021-06-02: qty 0.6

## 2021-06-02 NOTE — Patient Instructions (Signed)

## 2021-06-05 DIAGNOSIS — C7951 Secondary malignant neoplasm of bone: Secondary | ICD-10-CM | POA: Diagnosis not present

## 2021-06-05 DIAGNOSIS — C61 Malignant neoplasm of prostate: Secondary | ICD-10-CM | POA: Diagnosis not present

## 2021-06-05 DIAGNOSIS — Z5111 Encounter for antineoplastic chemotherapy: Secondary | ICD-10-CM | POA: Diagnosis not present

## 2021-06-05 DIAGNOSIS — Z510 Encounter for antineoplastic radiation therapy: Secondary | ICD-10-CM | POA: Diagnosis not present

## 2021-06-06 ENCOUNTER — Encounter: Payer: Self-pay | Admitting: Licensed Clinical Social Worker

## 2021-06-06 ENCOUNTER — Telehealth: Payer: Self-pay | Admitting: Oncology

## 2021-06-06 NOTE — Telephone Encounter (Signed)
Called patient just to advise that his appt on 2/23 will be at [facility] and not at [facility]. NO answer so voicemail was left with this information.

## 2021-06-06 NOTE — Progress Notes (Signed)
[facility] CSW Progress Note  Clinical Education officer, museum contacted patient by phone to follow-up.  Patient stated he did not have any needs or concerns expect, the need to confirm transportation. CSW spoke with transportation staff member, she stated she would the contact the patient directly.    Adelene Amas , LCSW

## 2021-06-08 ENCOUNTER — Other Ambulatory Visit: Payer: Self-pay

## 2021-06-08 ENCOUNTER — Inpatient Hospital Stay: Payer: Medicaid Other

## 2021-06-08 ENCOUNTER — Ambulatory Visit
Admission: RE | Admit: 2021-06-08 | Discharge: 2021-06-08 | Disposition: A | Discharge status: Home / Self Care | Payer: Medicaid Other | Source: Ambulatory Visit | Attending: Radiation Oncology | Admitting: Radiation Oncology

## 2021-06-08 DIAGNOSIS — C61 Malignant neoplasm of prostate: Secondary | ICD-10-CM

## 2021-06-08 DIAGNOSIS — C7951 Secondary malignant neoplasm of bone: Secondary | ICD-10-CM | POA: Diagnosis not present

## 2021-06-08 DIAGNOSIS — Z5111 Encounter for antineoplastic chemotherapy: Secondary | ICD-10-CM | POA: Diagnosis not present

## 2021-06-08 DIAGNOSIS — Z510 Encounter for antineoplastic radiation therapy: Secondary | ICD-10-CM | POA: Diagnosis not present

## 2021-06-09 ENCOUNTER — Inpatient Hospital Stay: Payer: Medicaid Other

## 2021-06-09 ENCOUNTER — Ambulatory Visit
Admission: RE | Admit: 2021-06-09 | Discharge: 2021-06-09 | Disposition: A | Discharge status: Home / Self Care | Payer: Medicaid Other | Source: Ambulatory Visit | Attending: Radiation Oncology | Admitting: Radiation Oncology

## 2021-06-09 ENCOUNTER — Other Ambulatory Visit: Payer: Self-pay

## 2021-06-09 DIAGNOSIS — Z510 Encounter for antineoplastic radiation therapy: Secondary | ICD-10-CM | POA: Diagnosis not present

## 2021-06-09 DIAGNOSIS — C7951 Secondary malignant neoplasm of bone: Secondary | ICD-10-CM | POA: Diagnosis not present

## 2021-06-09 DIAGNOSIS — C61 Malignant neoplasm of prostate: Secondary | ICD-10-CM | POA: Diagnosis not present

## 2021-06-09 DIAGNOSIS — Z5111 Encounter for antineoplastic chemotherapy: Secondary | ICD-10-CM | POA: Diagnosis not present

## 2021-06-12 ENCOUNTER — Inpatient Hospital Stay: Payer: Medicaid Other

## 2021-06-12 ENCOUNTER — Ambulatory Visit
Admission: RE | Admit: 2021-06-12 | Discharge: 2021-06-12 | Disposition: A | Discharge status: Home / Self Care | Payer: Medicaid Other | Source: Ambulatory Visit | Attending: Radiation Oncology | Admitting: Radiation Oncology

## 2021-06-12 ENCOUNTER — Other Ambulatory Visit: Payer: Self-pay

## 2021-06-12 DIAGNOSIS — C61 Malignant neoplasm of prostate: Secondary | ICD-10-CM

## 2021-06-12 DIAGNOSIS — Z5111 Encounter for antineoplastic chemotherapy: Secondary | ICD-10-CM | POA: Diagnosis not present

## 2021-06-12 DIAGNOSIS — Z510 Encounter for antineoplastic radiation therapy: Secondary | ICD-10-CM | POA: Diagnosis not present

## 2021-06-12 DIAGNOSIS — C7951 Secondary malignant neoplasm of bone: Secondary | ICD-10-CM | POA: Diagnosis not present

## 2021-06-12 LAB — CBC WITH DIFFERENTIAL (CANCER CENTER ONLY)
Abs Immature Granulocytes: 1.17 10*3/uL — ABNORMAL HIGH (ref 0.00–0.07)
Basophils Absolute: 0.1 10*3/uL (ref 0.0–0.1)
Basophils Relative: 1 %
Eosinophils Absolute: 0 10*3/uL (ref 0.0–0.5)
Eosinophils Relative: 0 %
HCT: 37 % — ABNORMAL LOW (ref 39.0–52.0)
Hemoglobin: 12.1 g/dL — ABNORMAL LOW (ref 13.0–17.0)
Immature Granulocytes: 7 %
Lymphocytes Relative: 9 %
Lymphs Abs: 1.5 10*3/uL (ref 0.7–4.0)
MCH: 29.7 pg (ref 26.0–34.0)
MCHC: 32.7 g/dL (ref 30.0–36.0)
MCV: 90.9 fL (ref 80.0–100.0)
Monocytes Absolute: 0.4 10*3/uL (ref 0.1–1.0)
Monocytes Relative: 2 %
Neutro Abs: 13.5 10*3/uL — ABNORMAL HIGH (ref 1.7–7.7)
Neutrophils Relative %: 81 %
Platelet Count: 115 10*3/uL — ABNORMAL LOW (ref 150–400)
RBC: 4.07 MIL/uL — ABNORMAL LOW (ref 4.22–5.81)
RDW: 15.6 % — ABNORMAL HIGH (ref 11.5–15.5)
Smear Review: NORMAL
WBC Count: 16.7 10*3/uL — ABNORMAL HIGH (ref 4.0–10.5)
nRBC: 0 % (ref 0.0–0.2)

## 2021-06-13 ENCOUNTER — Inpatient Hospital Stay: Payer: Medicaid Other

## 2021-06-13 ENCOUNTER — Ambulatory Visit
Admission: RE | Admit: 2021-06-13 | Discharge: 2021-06-13 | Disposition: A | Discharge status: Home / Self Care | Payer: Medicaid Other | Source: Ambulatory Visit | Attending: Radiation Oncology | Admitting: Radiation Oncology

## 2021-06-13 ENCOUNTER — Other Ambulatory Visit: Payer: Self-pay | Admitting: Nurse Practitioner

## 2021-06-13 DIAGNOSIS — C61 Malignant neoplasm of prostate: Secondary | ICD-10-CM

## 2021-06-13 DIAGNOSIS — C7951 Secondary malignant neoplasm of bone: Secondary | ICD-10-CM | POA: Diagnosis not present

## 2021-06-13 DIAGNOSIS — Z5111 Encounter for antineoplastic chemotherapy: Secondary | ICD-10-CM | POA: Diagnosis not present

## 2021-06-13 DIAGNOSIS — Z510 Encounter for antineoplastic radiation therapy: Secondary | ICD-10-CM | POA: Diagnosis not present

## 2021-06-13 MED ORDER — OXYCODONE HCL 5 MG PO TABS: 5.0000 mg | ORAL_TABLET | Freq: Four times a day (QID) | ORAL | 0 refills | Status: DC | PRN

## 2021-06-14 ENCOUNTER — Inpatient Hospital Stay: Payer: Medicaid Other | Attending: Nurse Practitioner

## 2021-06-14 ENCOUNTER — Ambulatory Visit
Admission: RE | Admit: 2021-06-14 | Discharge: 2021-06-14 | Disposition: A | Discharge status: Home / Self Care | Payer: Medicare Other | Source: Ambulatory Visit | Attending: Radiation Oncology | Admitting: Radiation Oncology

## 2021-06-14 ENCOUNTER — Other Ambulatory Visit: Payer: Self-pay

## 2021-06-14 DIAGNOSIS — C7951 Secondary malignant neoplasm of bone: Secondary | ICD-10-CM | POA: Insufficient documentation

## 2021-06-14 DIAGNOSIS — R200 Anesthesia of skin: Secondary | ICD-10-CM | POA: Insufficient documentation

## 2021-06-14 DIAGNOSIS — C61 Malignant neoplasm of prostate: Secondary | ICD-10-CM | POA: Diagnosis not present

## 2021-06-14 DIAGNOSIS — Z5189 Encounter for other specified aftercare: Secondary | ICD-10-CM | POA: Insufficient documentation

## 2021-06-14 DIAGNOSIS — C220 Liver cell carcinoma: Secondary | ICD-10-CM | POA: Insufficient documentation

## 2021-06-14 DIAGNOSIS — Z5111 Encounter for antineoplastic chemotherapy: Secondary | ICD-10-CM | POA: Diagnosis present

## 2021-06-14 DIAGNOSIS — Z510 Encounter for antineoplastic radiation therapy: Secondary | ICD-10-CM | POA: Diagnosis present

## 2021-06-14 DIAGNOSIS — R531 Weakness: Secondary | ICD-10-CM | POA: Insufficient documentation

## 2021-06-14 DIAGNOSIS — K7460 Unspecified cirrhosis of liver: Secondary | ICD-10-CM | POA: Insufficient documentation

## 2021-06-14 DIAGNOSIS — B370 Candidal stomatitis: Secondary | ICD-10-CM | POA: Insufficient documentation

## 2021-06-14 DIAGNOSIS — D649 Anemia, unspecified: Secondary | ICD-10-CM | POA: Insufficient documentation

## 2021-06-14 DIAGNOSIS — D696 Thrombocytopenia, unspecified: Secondary | ICD-10-CM | POA: Insufficient documentation

## 2021-06-14 DIAGNOSIS — M109 Gout, unspecified: Secondary | ICD-10-CM | POA: Insufficient documentation

## 2021-06-14 DIAGNOSIS — Z8619 Personal history of other infectious and parasitic diseases: Secondary | ICD-10-CM | POA: Insufficient documentation

## 2021-06-14 DIAGNOSIS — R161 Splenomegaly, not elsewhere classified: Secondary | ICD-10-CM | POA: Insufficient documentation

## 2021-06-14 DIAGNOSIS — F32A Depression, unspecified: Secondary | ICD-10-CM | POA: Insufficient documentation

## 2021-06-15 ENCOUNTER — Ambulatory Visit
Admission: RE | Admit: 2021-06-15 | Discharge: 2021-06-15 | Disposition: A | Discharge status: Home / Self Care | Payer: Medicare Other | Source: Ambulatory Visit | Attending: Radiation Oncology | Admitting: Radiation Oncology

## 2021-06-15 ENCOUNTER — Inpatient Hospital Stay: Payer: Medicare Other

## 2021-06-15 DIAGNOSIS — Z510 Encounter for antineoplastic radiation therapy: Secondary | ICD-10-CM | POA: Diagnosis not present

## 2021-06-15 DIAGNOSIS — C61 Malignant neoplasm of prostate: Secondary | ICD-10-CM | POA: Diagnosis not present

## 2021-06-15 DIAGNOSIS — C7951 Secondary malignant neoplasm of bone: Secondary | ICD-10-CM | POA: Diagnosis not present

## 2021-06-16 ENCOUNTER — Ambulatory Visit: Payer: Medicare Other

## 2021-06-18 ENCOUNTER — Other Ambulatory Visit: Payer: Self-pay | Admitting: Oncology

## 2021-06-19 ENCOUNTER — Inpatient Hospital Stay: Payer: Medicaid Other

## 2021-06-19 ENCOUNTER — Ambulatory Visit: Payer: Medicare Other

## 2021-06-19 ENCOUNTER — Other Ambulatory Visit: Payer: Self-pay

## 2021-06-19 ENCOUNTER — Ambulatory Visit
Admission: RE | Admit: 2021-06-19 | Discharge: 2021-06-19 | Disposition: A | Discharge status: Home / Self Care | Payer: Medicare Other | Source: Ambulatory Visit | Attending: Radiation Oncology | Admitting: Radiation Oncology

## 2021-06-19 DIAGNOSIS — C7951 Secondary malignant neoplasm of bone: Secondary | ICD-10-CM | POA: Diagnosis not present

## 2021-06-19 DIAGNOSIS — C61 Malignant neoplasm of prostate: Secondary | ICD-10-CM | POA: Diagnosis not present

## 2021-06-19 DIAGNOSIS — Z510 Encounter for antineoplastic radiation therapy: Secondary | ICD-10-CM | POA: Diagnosis not present

## 2021-06-20 ENCOUNTER — Ambulatory Visit
Admission: RE | Admit: 2021-06-20 | Discharge: 2021-06-20 | Disposition: A | Discharge status: Home / Self Care | Payer: Medicare Other | Source: Ambulatory Visit | Attending: Radiation Oncology | Admitting: Radiation Oncology

## 2021-06-20 ENCOUNTER — Inpatient Hospital Stay: Payer: Medicaid Other

## 2021-06-20 ENCOUNTER — Encounter: Payer: Self-pay | Admitting: Radiation Oncology

## 2021-06-20 DIAGNOSIS — C7951 Secondary malignant neoplasm of bone: Secondary | ICD-10-CM | POA: Diagnosis not present

## 2021-06-20 DIAGNOSIS — Z510 Encounter for antineoplastic radiation therapy: Secondary | ICD-10-CM | POA: Diagnosis not present

## 2021-06-20 DIAGNOSIS — C61 Malignant neoplasm of prostate: Secondary | ICD-10-CM | POA: Diagnosis not present

## 2021-06-21 ENCOUNTER — Inpatient Hospital Stay (HOSPITAL_BASED_OUTPATIENT_CLINIC_OR_DEPARTMENT_OTHER): Payer: Medicaid Other | Admitting: Oncology

## 2021-06-21 ENCOUNTER — Encounter: Payer: Self-pay | Admitting: *Deleted

## 2021-06-21 ENCOUNTER — Inpatient Hospital Stay: Payer: Medicaid Other

## 2021-06-21 ENCOUNTER — Other Ambulatory Visit: Payer: Self-pay

## 2021-06-21 VITALS — BP 141/79 | HR 73 | Temp 97.8°F | Resp 20 | Ht 62.0 in | Wt 155.6 lb

## 2021-06-21 DIAGNOSIS — Z8619 Personal history of other infectious and parasitic diseases: Secondary | ICD-10-CM | POA: Insufficient documentation

## 2021-06-21 DIAGNOSIS — B370 Candidal stomatitis: Secondary | ICD-10-CM | POA: Diagnosis not present

## 2021-06-21 DIAGNOSIS — R200 Anesthesia of skin: Secondary | ICD-10-CM | POA: Insufficient documentation

## 2021-06-21 DIAGNOSIS — C61 Malignant neoplasm of prostate: Secondary | ICD-10-CM | POA: Diagnosis not present

## 2021-06-21 DIAGNOSIS — D649 Anemia, unspecified: Secondary | ICD-10-CM | POA: Diagnosis not present

## 2021-06-21 DIAGNOSIS — K7460 Unspecified cirrhosis of liver: Secondary | ICD-10-CM | POA: Diagnosis not present

## 2021-06-21 DIAGNOSIS — F32A Depression, unspecified: Secondary | ICD-10-CM | POA: Diagnosis not present

## 2021-06-21 DIAGNOSIS — R531 Weakness: Secondary | ICD-10-CM | POA: Insufficient documentation

## 2021-06-21 DIAGNOSIS — C7951 Secondary malignant neoplasm of bone: Secondary | ICD-10-CM | POA: Diagnosis not present

## 2021-06-21 DIAGNOSIS — Z5111 Encounter for antineoplastic chemotherapy: Secondary | ICD-10-CM | POA: Diagnosis present

## 2021-06-21 DIAGNOSIS — M109 Gout, unspecified: Secondary | ICD-10-CM | POA: Diagnosis not present

## 2021-06-21 DIAGNOSIS — D696 Thrombocytopenia, unspecified: Secondary | ICD-10-CM | POA: Diagnosis not present

## 2021-06-21 DIAGNOSIS — R161 Splenomegaly, not elsewhere classified: Secondary | ICD-10-CM | POA: Diagnosis not present

## 2021-06-21 DIAGNOSIS — C220 Liver cell carcinoma: Secondary | ICD-10-CM | POA: Diagnosis not present

## 2021-06-21 DIAGNOSIS — Z5189 Encounter for other specified aftercare: Secondary | ICD-10-CM | POA: Diagnosis not present

## 2021-06-21 LAB — CBC WITH DIFFERENTIAL (CANCER CENTER ONLY)
Abs Immature Granulocytes: 0.05 10*3/uL (ref 0.00–0.07)
Basophils Absolute: 0.1 10*3/uL (ref 0.0–0.1)
Basophils Relative: 1 %
Eosinophils Absolute: 0 10*3/uL (ref 0.0–0.5)
Eosinophils Relative: 0 %
HCT: 37.2 % — ABNORMAL LOW (ref 39.0–52.0)
Hemoglobin: 12.2 g/dL — ABNORMAL LOW (ref 13.0–17.0)
Immature Granulocytes: 1 %
Lymphocytes Relative: 12 %
Lymphs Abs: 0.9 10*3/uL (ref 0.7–4.0)
MCH: 29.9 pg (ref 26.0–34.0)
MCHC: 32.8 g/dL (ref 30.0–36.0)
MCV: 91.2 fL (ref 80.0–100.0)
Monocytes Absolute: 0.5 10*3/uL (ref 0.1–1.0)
Monocytes Relative: 7 %
Neutro Abs: 5.8 10*3/uL (ref 1.7–7.7)
Neutrophils Relative %: 79 %
Platelet Count: 122 10*3/uL — ABNORMAL LOW (ref 150–400)
RBC: 4.08 MIL/uL — ABNORMAL LOW (ref 4.22–5.81)
RDW: 16 % — ABNORMAL HIGH (ref 11.5–15.5)
WBC Count: 7.3 10*3/uL (ref 4.0–10.5)
nRBC: 0 % (ref 0.0–0.2)

## 2021-06-21 LAB — CMP (CANCER CENTER ONLY)
ALT: 22 U/L (ref 0–44)
AST: 17 U/L (ref 15–41)
Albumin: 4.3 g/dL (ref 3.5–5.0)
Alkaline Phosphatase: 103 U/L (ref 38–126)
Anion gap: 11 (ref 5–15)
BUN: 13 mg/dL (ref 6–20)
CO2: 19 mmol/L — ABNORMAL LOW (ref 22–32)
Calcium: 9.1 mg/dL (ref 8.9–10.3)
Chloride: 109 mmol/L (ref 98–111)
Creatinine: 0.57 mg/dL — ABNORMAL LOW (ref 0.61–1.24)
GFR, Estimated: >60 mL/min (ref >60)
Glucose, Bld: 128 mg/dL — ABNORMAL HIGH (ref 70–99)
Potassium: 3.7 mmol/L (ref 3.5–5.1)
Sodium: 139 mmol/L (ref 135–145)
Total Bilirubin: 0.6 mg/dL (ref 0.3–1.2)
Total Protein: 7.2 g/dL (ref 6.5–8.1)

## 2021-06-21 MED ORDER — SODIUM CHLORIDE 0.9 % IV SOLN: Freq: Once | INTRAVENOUS | Status: AC

## 2021-06-21 MED ORDER — SODIUM CHLORIDE 0.9 % IV SOLN
60.0000 mg/m2 | Freq: Once | INTRAVENOUS | Status: AC
  Administered 2021-06-21: 100 mg via INTRAVENOUS
  Filled 2021-06-21: qty 10

## 2021-06-21 MED ORDER — SODIUM CHLORIDE 0.9 % IV SOLN
10.0000 mg | Freq: Once | INTRAVENOUS | Status: AC
  Administered 2021-06-21: 10 mg via INTRAVENOUS
  Filled 2021-06-21: qty 1

## 2021-06-21 NOTE — Patient Instructions (Signed)
Edgar Mooney   Discharge Instructions: Thank you for choosing [facility] to provide your oncology and hematology care.   If you have a lab appointment with the [facility], please go directly to the [facility] and check in at the registration area.   Wear comfortable clothing and clothing appropriate for easy access to any Portacath or PICC line.   We strive to give you quality time with your provider. You may need to reschedule your appointment if you arrive late (15 or more minutes).  Arriving late affects you and other patients whose appointments are after yours.  Also, if you miss three or more appointments without notifying the office, you may be dismissed from the clinic at the providers discretion.      For prescription refill requests, have your pharmacy contact our office and allow 72 hours for refills to be completed.    Today you received the following chemotherapy and/or immunotherapy agents Taxotere      To help prevent nausea and vomiting after your treatment, we encourage you to take your nausea medication as directed.  BELOW ARE SYMPTOMS THAT SHOULD BE REPORTED IMMEDIATELY: *FEVER GREATER THAN 100.4 F (38 C) OR HIGHER *CHILLS OR SWEATING *NAUSEA AND VOMITING THAT IS NOT CONTROLLED WITH YOUR NAUSEA MEDICATION *UNUSUAL SHORTNESS OF BREATH *UNUSUAL BRUISING OR BLEEDING *URINARY PROBLEMS (pain or burning when urinating, or frequent urination) *BOWEL PROBLEMS (unusual diarrhea, constipation, pain near the anus) TENDERNESS IN MOUTH AND THROAT WITH OR WITHOUT PRESENCE OF ULCERS (sore throat, sores in mouth, or a toothache) UNUSUAL RASH, SWELLING OR PAIN  UNUSUAL VAGINAL DISCHARGE OR ITCHING   Items with * indicate a potential emergency and should be followed up as soon as possible or go to the Emergency Department if any problems should occur.  Please show the CHEMOTHERAPY ALERT CARD or IMMUNOTHERAPY ALERT CARD at check-in to the  Emergency Department and triage nurse.  Should you have questions after your visit or need to cancel or reschedule your appointment, please contact [facility]  Dept: [phone]  and follow the prompts.  Office hours are 8:00 a.m. to 4:30 p.m. Monday - Friday. Please note that voicemails left after 4:00 p.m. may not be returned until the following business day.  We are closed weekends and major holidays. You have access to a nurse at all times for urgent questions. Please call the main number to the clinic Dept: [phone] and follow the prompts.   For any non-urgent questions, you may also contact your provider using MyChart. We now offer e-Visits for anyone 24 and older to request care online for non-urgent symptoms. For details visit [email or web address].   Also download the MyChart app! Go to the app store, search "MyChart", open the app, select [facility], and log in with your MyChart username and password.  Due to Covid, a mask is required upon entering the hospital/clinic. If you do not have a mask, one will be given to you upon arrival. For doctor visits, patients may have 1 support person aged 4 or older with them. For treatment visits, patients cannot have anyone with them due to current Covid guidelines and our immunocompromised population.   Docetaxel injection What is this medication? DOCETAXEL (doe se TAX el) is a chemotherapy drug. It targets fast dividing cells, like cancer cells, and causes these cells to die. This medicine is used to treat many types of cancers like breast cancer, certain stomach cancers, head and neck  cancer, lung cancer, and prostate cancer. This medicine may be used for other purposes; ask your health care provider or pharmacist if you have questions. COMMON BRAND NAME(S): Docefrez, Taxotere What should I tell my care team before I take this medication? They need to know if you have any of these conditions: infection  (especially a virus infection such as chickenpox, cold sores, or herpes) liver disease low blood counts, like low white cell, platelet, or red cell counts an unusual or allergic reaction to docetaxel, polysorbate 80, other chemotherapy agents, other medicines, foods, dyes, or preservatives pregnant or trying to get pregnant breast-feeding How should I use this medication? This drug is given as an infusion into a vein. It is administered in a hospital or clinic by a specially trained health care professional. Talk to your pediatrician regarding the use of this medicine in children. Special care may be needed. Overdosage: If you think you have taken too much of this medicine contact a poison control center or emergency room at once. NOTE: This medicine is only for you. Do not share this medicine with others. What if I miss a dose? It is important not to miss your dose. Call your doctor or health care professional if you are unable to keep an appointment. What may interact with this medication? Do not take this medicine with any of the following medications: live virus vaccines This medicine may also interact with the following medications: aprepitant certain antibiotics like erythromycin or clarithromycin certain antivirals for HIV or hepatitis certain medicines for fungal infections like fluconazole, itraconazole, ketoconazole, posaconazole, or voriconazole cimetidine ciprofloxacin conivaptan cyclosporine dronedarone fluvoxamine grapefruit juice imatinib verapamil This list may not describe all possible interactions. Give your health care provider a list of all the medicines, herbs, non-prescription drugs, or dietary supplements you use. Also tell them if you smoke, drink alcohol, or use illegal drugs. Some items may interact with your medicine. What should I watch for while using this medication? Your condition will be monitored carefully while you are receiving this medicine. You  will need important blood work done while you are taking this medicine. Call your doctor or health care professional for advice if you get a fever, chills or sore throat, or other symptoms of a cold or flu. Do not treat yourself. This drug decreases your body's ability to fight infections. Try to avoid being around people who are sick. Some products may contain alcohol. Ask your health care professional if this medicine contains alcohol. Be sure to tell all health care professionals you are taking this medicine. Certain medicines, like metronidazole and disulfiram, can cause an unpleasant reaction when taken with alcohol. The reaction includes flushing, headache, nausea, vomiting, sweating, and increased thirst. The reaction can last from 30 minutes to several hours. You may get drowsy or dizzy. Do not drive, use machinery, or do anything that needs mental alertness until you know how this medicine affects you. Do not stand or sit up quickly, especially if you are an older patient. This reduces the risk of dizzy or fainting spells. Alcohol may interfere with the effect of this medicine. Talk to your health care professional about your risk of cancer. You may be more at risk for certain types of cancer if you take this medicine. Do not become pregnant while taking this medicine or for 6 months after stopping it. Women should inform their doctor if they wish to become pregnant or think they might be pregnant. There is a potential for serious side effects  to an unborn child. Talk to your health care professional or pharmacist for more information. Do not breast-feed an infant while taking this medicine or for 1 week after stopping it. Males who get this medicine must use a condom during sex with females who can get pregnant. If you get a woman pregnant, the baby could have birth defects. The baby could die before they are born. You will need to continue wearing a condom for 3 months after stopping the medicine.  Tell your health care provider right away if your partner becomes pregnant while you are taking this medicine. This may interfere with the ability to father a child. You should talk to your doctor or health care professional if you are concerned about your fertility. What side effects may I notice from receiving this medication? Side effects that you should report to your doctor or health care professional as soon as possible: allergic reactions like skin rash, itching or hives, swelling of the face, lips, or tongue blurred vision breathing problems changes in vision low blood counts - This drug may decrease the number of white blood cells, red blood cells and platelets. You may be at increased risk for infections and bleeding. nausea and vomiting pain, redness or irritation at site where injected pain, tingling, numbness in the hands or feet redness, blistering, peeling, or loosening of the skin, including inside the mouth signs of decreased platelets or bleeding - bruising, pinpoint red spots on the skin, black, tarry stools, nosebleeds signs of decreased red blood cells - unusually weak or tired, fainting spells, lightheadedness signs of infection - fever or chills, cough, sore throat, pain or difficulty passing urine swelling of the ankle, feet, hands Side effects that usually do not require medical attention (report to your doctor or health care professional if they continue or are bothersome): constipation diarrhea fingernail or toenail changes hair loss loss of appetite mouth sores muscle pain This list may not describe all possible side effects. Call your doctor for medical advice about side effects. You may report side effects to FDA at 1-800-FDA-1088. Where should I keep my medication? This drug is given in a hospital or clinic and will not be stored at home. NOTE: This sheet is a summary. It may not cover all possible information. If you have questions about this medicine, talk  to your doctor, pharmacist, or health care provider.  2022 Elsevier/Gold Standard (2020-12-20 00:00:00)

## 2021-06-21 NOTE — Progress Notes (Signed)
Patient presents for treatment. RN assessment completed along with the following: ? ?Labs/vitals reviewed - Yes, and within treatment parameters.   ?Weight within 10% of previous measurement - Yes ?Informed consent completed and reflects current therapy/intent - Yes, on date 05/31/21             ?Provider progress note reviewed - Today's provider note is not yet available. I reviewed the most recent oncology provider progress note in chart dated 05/23/21. ?Treatment/Antibody/Supportive plan reviewed - Yes, and there are no adjustments needed for today's treatment. ?S&H and other orders reviewed - Yes, and there are no additional orders identified. ?Previous treatment date reviewed - Yes, and the appropriate amount of time has elapsed between treatments. ?Clinic Hand Off Received from - Cristy Friedlander, RN ? ?Patient to proceed with treatment.  ? ?

## 2021-06-21 NOTE — Progress Notes (Signed)
Patient seen by Dr. Sherrill today ? ?Vitals are within treatment parameters. ? ?Labs reviewed by Dr. Sherrill and are within treatment parameters. ? ?Per physician team, patient is ready for treatment and there are NO modifications to the treatment plan.  ?

## 2021-06-21 NOTE — Progress Notes (Signed)
?[facility] ?OFFICE PROGRESS NOTE ? ? ?Diagnosis: Prostate cancer, hepatocellular carcinoma ? ?INTERVAL HISTORY:  ? ?Edgar Mooney completed cycle 1 docetaxel 05/31/2021.  He received G-CSF on 06/02/2021.  No nausea, rash, neuropathy symptoms, or bone pain following treatment.  He completed a course of palliative radiation to the left acetabulum yesterday.  The left-sided hip discomfort is significantly improved.  He has mild "soreness "in the suprapubic area for the past few days.  No dysuria.  He is scheduled for Y90 treatment of the hepatocellular carcinoma 06/26/2021. ? ?Objective: ? ?Vital signs in last 24 hours: ? ?Blood pressure (!) 141/79, pulse 73, temperature 97.8 ?F (36.6 ?C), temperature source Oral, resp. rate 20, height 5' 2" (1.575 m), weight 155 lb 9.6 oz (70.6 kg), SpO2 100 %. ?  ? ?HEENT: Mild thrush at the left buccal mucosa and some ungual regions ?Resp: Lungs clear bilaterally ?Cardio: Regular rate and rhythm ?GI: No hepatosplenomegaly, mild tenderness in the suprapubic area, no mass ?Vascular: No leg edema ? ? ?Lab Results: ? ?Lab Results  ?Component Value Date  ? WBC 7.3 06/21/2021  ? HGB 12.2 (L) 06/21/2021  ? HCT 37.2 (L) 06/21/2021  ? MCV 91.2 06/21/2021  ? PLT 122 (L) 06/21/2021  ? NEUTROABS 5.8 06/21/2021  ? ? ?CMP  ?Lab Results  ?Component Value Date  ? NA 139 06/21/2021  ? K 3.7 06/21/2021  ? CL 109 06/21/2021  ? CO2 19 (L) 06/21/2021  ? GLUCOSE 128 (H) 06/21/2021  ? BUN 13 06/21/2021  ? CREATININE 0.57 (L) 06/21/2021  ? CALCIUM 9.1 06/21/2021  ? PROT 7.2 06/21/2021  ? ALBUMIN 4.3 06/21/2021  ? AST 17 06/21/2021  ? ALT 22 06/21/2021  ? ALKPHOS 103 06/21/2021  ? BILITOT 0.6 06/21/2021  ? GFRNONAA >60 06/21/2021  ? GFRAA 121 12/19/2017  ? ? ?No results found for: CEA1, CEA, [id number], CA125 ? ? ? ?Medications: I have reviewed the patient's current medications. ? ? ?Assessment/Plan: ?Metastatic prostate cancer ?- Lytic bone lesions with retroperitoneal adenopathy concerning for  metastatic prostate cancer ?-03/03/2020-CTA chest/abdomen/pelvis widespread osseous metastatic disease and lower retroperitoneal adenopathy, pathologic right anterior third rib fracture, probable spinal canal tumor at the level of S1, ?-MRI cervical, thoracic, and lumbar spine 03/03/2020-diffuse osseous metastatic disease, ventral epidural tumor impinging on right S1 nerve root, extraosseous tumor at the bilateral ilium, asymmetric enhancing material at the right C5-6 canal-right  ?-03/03/2020-PSA 763 ?-Degarelix 03/04/2020 ?-Abiraterone/prednisone 03/14/2020 ?-Every 14-monthLupron 04/01/2020 ?-04/01/2020 PSA 36.5 ?-05/31/2020 PSA 1.2 ?-06/28/2020 PSA 1.0 ?-12/14/2020 PSA 6.4 ?-01/13/2021 PSA 10.6 ?-04/26/2021 PSA  45.2 ?-05/16/2021 PSA 70 ?-Abiraterone/prednisone discontinued 05/16/2021 ?-05/16/2021 left hip x-ray-multifocal sclerotic metastasis throughout the pelvis, confluent involving the left acetabulum.  No acute or pathologic fracture.  Left hip osteoarthritis. ?-Cycle 1 docetaxel 05/31/2021 ?-Pilla radiation to the left acetabulum 06/08/2021 - 06/20/2021 ?-Cycle 2 docetaxel 06/21/2021 ?  ?2.  Severe anemia-likely secondary to metastatic prostate cancer involving the bones ?3.  Mild thrombocytopenia ?4.  Cirrhosis with splenomegaly ?5.  History of hepatitis C ?6.  History of polysubstance abuse ?7.  Depression ?8.  Tobacco dependence ?9.  Right arm weakness, right facial numbness-potentially related to nerve root compromise from metastatic bone lesions ?10.  Pain secondary to #1 ?11.  Extensive bone metastases-every 350-monthometa starting 04/01/2020 ?12.  Gout-acute flare right wrist 04/01/2020 treated with indomethacin, allopurinol resumed ?13.  Right hepatic lobe mass with elevated AFP concerning for HCC ?MRI abdomen 12/21/2020-hypoenhancing inferior right liver mass, segment 6, occluding or compressing the adjacent  right portal vein branch, intrahepatic cholangiocarcinoma favored with differential including  hepatocellular carcinoma, Li-Rads M ?Y 90 planning study 11-22 ?14.  Admission 12/19/2020 with an NSTEMI ?Catheterization 12/21/2020-severe multivessel CAD, 99% proximal LAD stenosis, LAD stent placed ?15. 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 ? ? ?Disposition: ?Edgar Mooney has completed 1 cycle of docetaxel.  He tolerated the chemotherapy well.  He will complete cycle 2 today. ?He completed palliative radiation to the left acetabulum.  His pain is significantly improved. ? ?Edgar Mooney will return for a nadir CBC on 07/03/2021.  He will be scheduled for an office visit in the next cycle of chemotherapy on 07/12/2021. ? ?He is scheduled for Y90 treatment of the hepatocellular carcinoma 06/26/2021.  I will communicate with interventional radiology to see if they feel this procedure should be delayed due to the current chemotherapy. ? ? ? ? ? ? , MD ? ?06/21/2021  ?10:21 AM ? ? ?

## 2021-06-22 ENCOUNTER — Telehealth: Payer: Self-pay

## 2021-06-22 LAB — PROSTATE-SPECIFIC AG, SERUM (LABCORP): Prostate Specific Ag, Serum: 68.3 ng/mL — ABNORMAL HIGH (ref 0.0–4.0)

## 2021-06-22 NOTE — Telephone Encounter (Signed)
Pt verbalized understanding. Inquired if he should go to treatment scheduled for Monday. Informed Pt Dr Benay Spice hasn't spoken to Interventional Radiology yet we will give him a call tomorrow to confirm. ?

## 2021-06-22 NOTE — Telephone Encounter (Signed)
-----   Message from Ladell Pier, MD sent at 06/22/2021  4:10 PM EST ----- ?Please call patient, PSA is better, follow-up as scheduled ? ?

## 2021-06-23 ENCOUNTER — Other Ambulatory Visit: Payer: Self-pay | Admitting: Internal Medicine

## 2021-06-23 ENCOUNTER — Other Ambulatory Visit: Payer: Self-pay

## 2021-06-23 ENCOUNTER — Inpatient Hospital Stay: Payer: Medicaid Other

## 2021-06-23 ENCOUNTER — Encounter: Payer: Self-pay | Admitting: Oncology

## 2021-06-23 VITALS — BP 128/91 | HR 98 | Temp 98.2°F | Resp 18

## 2021-06-23 DIAGNOSIS — C61 Malignant neoplasm of prostate: Secondary | ICD-10-CM

## 2021-06-23 DIAGNOSIS — Z5111 Encounter for antineoplastic chemotherapy: Secondary | ICD-10-CM | POA: Diagnosis not present

## 2021-06-23 MED ORDER — PEGFILGRASTIM-BMEZ 6 MG/0.6ML ~~LOC~~ SOSY
6.0000 mg | PREFILLED_SYRINGE | Freq: Once | SUBCUTANEOUS | Status: AC
  Administered 2021-06-23: 6 mg via SUBCUTANEOUS
  Filled 2021-06-23: qty 0.6

## 2021-06-23 NOTE — Patient Instructions (Signed)

## 2021-06-25 NOTE — H&P (Signed)
Chief Complaint: Patient was seen in consultation today for image guided visceral arteriogram with possible visceral artery embolization and injection of Y-90 at the request of Suttle,Dylan J.  Referring Physician(s): Bennie Dallas  Supervising Physician: Marliss Coots  Patient Status: [facility] - Out-pt  History of Present Illness: Edgar Mooney is a 56 y.o. male with PMH of polysubstance abuse, depression, HCV, anemia, thrombocytopenia, cirrhosis, splenomegaly and gout, metastatic prostate cancer and hepatocellular carcinoma.  Patient has been receiving palliative chemotherapy treatments. Patient had pre-Y 90 procedure with Dr. Marliss Coots, IR November 2022.  Patient presents today for visceral arteriogram with possible visceral artery embolization and injection of Y90 for hepatocellular carcinoma.  Past Medical History:  Diagnosis Date   Alcohol abuse    Cocaine abuse (HCC)    Depression    Gout    Hep C w/o coma, chronic (HCC) diagnosed May 2016   Monoallelic mutation of ATM gene 47/07/3555    Past Surgical History:  Procedure Laterality Date   CORONARY STENT INTERVENTION N/A 12/21/2020   Procedure: CORONARY STENT INTERVENTION;  Surgeon: Lennette Bihari, MD;  Location: [facility];  Service: Cardiovascular;  Laterality: N/A;   CORONARY STENT INTERVENTION N/A 12/22/2020   Procedure: CORONARY STENT INTERVENTION;  Surgeon: Kathleene Hazel, MD;  Location: [facility];  Service: Cardiovascular;  Laterality: N/A;   IR 3D INDEPENDENT WKST  02/15/2021   IR ANGIOGRAM SELECTIVE EACH ADDITIONAL VESSEL  02/15/2021   IR ANGIOGRAM SELECTIVE EACH ADDITIONAL VESSEL  02/15/2021   IR ANGIOGRAM VISCERAL SELECTIVE  02/15/2021   IR ANGIOGRAM VISCERAL SELECTIVE  02/15/2021   IR RADIOLOGIST EVAL & MGMT  01/11/2021   IR US GUIDE VASC ACCESS RIGHT  02/15/2021   LEFT HEART CATH AND CORONARY ANGIOGRAPHY N/A 12/21/2020   Procedure: LEFT HEART CATH AND CORONARY ANGIOGRAPHY;  Surgeon: Lennette Bihari, MD;  Location: [facility];  Service: Cardiovascular;  Laterality: N/A;    Allergies: Patient has no known allergies.  Medications: Prior to Admission medications   Medication Sig Start Date End Date Taking? Authorizing Provider  allopurinol (ZYLOPRIM) 100 MG tablet TAKE 1 TABLET BY MOUTH EVERY DAY START ON 12/24 09/05/20   Ladene Artist, MD  amLODipine (NORVASC) 5 MG tablet Take 1 tablet (5 mg total) by mouth daily. 12/24/20   Marinda Elk, MD  aspirin 81 MG chewable tablet Chew 1 tablet (81 mg total) by mouth daily. 12/24/20   Marinda Elk, MD  ferrous gluconate (FERGON) 324 MG tablet Take 1 tablet (324 mg total) by mouth 2 (two) times daily with a meal. Patient not taking: Reported on 05/16/2021 03/07/20   Alessandra Bevels, MD  indomethacin (INDOCIN) 50 MG capsule Take 1 capsule (50 mg total) by mouth 3 (three) times daily with meals. For gout flare Patient not taking: Reported on 06/21/2021 04/01/20   Rana Snare, NP  nitroGLYCERIN (NITROSTAT) 0.4 MG SL tablet Place 1 tablet (0.4 mg total) under the tongue every 5 (five) minutes as needed for chest pain. Patient not taking: Reported on 06/21/2021 12/23/20   Marinda Elk, MD  ondansetron (ZOFRAN) 8 MG tablet Take 1 tablet (8 mg total) by mouth every 8 (eight) hours as needed for nausea or vomiting. Patient not taking: Reported on 06/21/2021 05/31/21   Ladene Artist, MD  oxyCODONE (OXY IR/ROXICODONE) 5 MG immediate release tablet Take 1 tablet (5 mg total) by mouth every 6 (six) hours as needed for severe pain. 06/13/21  Rana Snare, NP  predniSONE (DELTASONE) 5 MG tablet Take 1 tablet (5 mg total) by mouth 2 (two) times daily with a meal. 05/31/21   Rana Snare, NP  prochlorperazine (COMPAZINE) 10 MG tablet Take 1 tablet (10 mg total) by mouth every 6 (six) hours as needed for nausea or vomiting. Patient not taking: Reported on 06/21/2021 05/31/21   Ladene Artist, MD  rosuvastatin (CRESTOR) 20 MG  tablet Take 1 tablet (20 mg total) by mouth daily. 12/24/20   Marinda Elk, MD  tamsulosin (FLOMAX) 0.4 MG CAPS capsule Take 1 capsule (0.4 mg total) by mouth daily. 05/11/21   Ladene Artist, MD  ticagrelor (BRILINTA) 90 MG TABS tablet Take 1 tablet (90 mg total) by mouth 2 (two) times daily. 12/23/20   Marinda Elk, MD     Family History  Problem Relation Age of Onset   Cancer Mother    Cancer Father     Social History   Socioeconomic History   Marital status: Single    Spouse name: Not on file   Number of children: Not on file   Years of education: Not on file   Highest education level: Not on file  Occupational History   Not on file  Tobacco Use   Smoking status: Every Day    Packs/day: 1.00    Years: 25.00    Pack years: 25.00    Types: Cigarettes   Smokeless tobacco: Never  Substance and Sexual Activity   Alcohol use: Not Currently    Alcohol/week: 126.0 standard drinks    Types: 126 Cans of beer per week    Comment: 18 pack per day   Drug use: Yes    Types: Cocaine, IV, Heroin, Marijuana    Comment: Used crack and cocaine 04/02/16   Sexual activity: Not Currently  Other Topics Concern   Not on file  Social History Narrative   Not on file   Social Determinants of Health   Financial Resource Strain: Not on file  Food Insecurity: Not on file  Transportation Needs: Not on file  Physical Activity: Not on file  Stress: Not on file  Social Connections: Not on file     Review of Systems: A 12 point ROS discussed and pertinent positives are indicated in the HPI above.  All other systems are negative.  Review of Systems  Constitutional:  Positive for fatigue. Negative for chills and fever.  Respiratory:  Negative for cough and shortness of breath.   Cardiovascular:  Negative for chest pain and leg swelling.  Gastrointestinal:  Negative for abdominal pain, nausea and vomiting.  Neurological:  Negative for dizziness and headaches.   Vital  Signs: BP 131/85    Pulse 79    Temp 97.8 F (36.6 C) (Oral)    Resp 18    Ht 5\' 2"  (1.575 m)    Wt 155 lb (70.3 kg)    SpO2 100%    BMI 28.35 kg/m   Physical Exam Constitutional:      Appearance: Normal appearance. He is not ill-appearing.  HENT:     Head: Normocephalic and atraumatic.     Mouth/Throat:     Mouth: Mucous membranes are dry.     Pharynx: Oropharynx is clear.  Eyes:     Extraocular Movements: Extraocular movements intact.     Pupils: Pupils are equal, round, and reactive to light.  Cardiovascular:     Rate and Rhythm: Normal rate and regular rhythm.  Pulses: Normal pulses.     Heart sounds: Normal heart sounds.  Pulmonary:     Effort: Pulmonary effort is normal. No respiratory distress.     Breath sounds: Normal breath sounds. No stridor. No wheezing, rhonchi or rales.  Abdominal:     General: Bowel sounds are normal. There is no distension.     Palpations: Abdomen is soft.     Tenderness: There is no abdominal tenderness. There is no guarding.  Musculoskeletal:     Right lower leg: No edema.     Left lower leg: No edema.  Skin:    General: Skin is warm and dry.  Neurological:     Mental Status: He is alert and oriented to person, place, and time.  Psychiatric:        Mood and Affect: Mood normal.        Behavior: Behavior normal.        Thought Content: Thought content normal.        Judgment: Judgment normal.    Imaging: No results found.  Labs:  CBC: Recent Labs    05/16/21 1054 05/31/21 0835 06/12/21 1044 06/21/21 0928  WBC 9.6 10.9* 16.7* 7.3  HGB 11.0* 11.4* 12.1* 12.2*  HCT 33.7* 33.9* 37.0* 37.2*  PLT 205 178 115* 122*    COAGS: Recent Labs    12/14/20 1003 02/15/21 0812 06/26/21 0803  INR 0.9 0.9 1.0    BMP: Recent Labs    04/26/21 0852 05/16/21 1054 05/31/21 0835 06/21/21 0928  NA 141 138 134* 139  K 2.8* 4.2 4.1 3.7  CL 107 107 103 109  CO2 23 22 20* 19*  GLUCOSE 138* 114* 99 128*  BUN 11 22* 13 13  CALCIUM  9.2 9.7 9.1 9.1  CREATININE 0.52* 0.52* 0.55* 0.57*  GFRNONAA >60 >60 >60 >60    LIVER FUNCTION TESTS: Recent Labs    04/26/21 0852 05/16/21 1054 05/31/21 0835 06/21/21 0928  BILITOT 1.6* 0.6 0.9 0.6  AST 13* 14* 17 17  ALT 8 34 19 22  ALKPHOS 84 101 104 103  PROT 7.3 7.3 7.7 7.2  ALBUMIN 4.2 4.1 4.3 4.3    TUMOR MARKERS: No results for input(s): AFPTM, CEA, CA199, CHROMGRNA in the last 8760 hours.  Assessment and Plan: History of polysubstance abuse, depression, HCV, anemia, thrombocytopenia, cirrhosis, splenomegaly and gout, metastatic prostate cancer and hepatocellular carcinoma.  Patient has been receiving palliative chemotherapy treatments. Patient had pre-Y 90 procedure with Dr. Ruthann Cancer, IR November 2022.  Patient presents today for visceral arteriogram with possible visceral artery embolization and injection of Y90 for hepatocellular carcinoma.  Pt resting on stretcher. He is A&O, calm and pleasant. He is in no distress. Pt states he is NPO per order.  He reports taking his Brilinta and 81 mg ASA yesterday.  Pt is 5'2" WBC and INR WNL VSS  Risks and benefits discussed with the patient including, but not limited to bleeding, infection, vascular injury, post procedural pain, nausea, vomiting and fatigue, contrast induced renal failure, liver failure, radiation injury to the bowel, radiation induced cholecystitis, neutropenia and possible need for additional procedures.  All of the patient's questions were answered, patient is agreeable to proceed. Consent signed and in chart.   Thank you for this interesting consult.  I greatly enjoyed meeting Edgar Mooney and look forward to participating in their care.  A copy of this report was sent to the requesting provider on this date.  Electronically Signed: Tyson Alias, NP  06/26/2021, 8:42 AM   I spent a total of 20 minutes in face to face in clinical consultation, greater than 50% of which was counseling/coordinating  care for image guided visceral arteriogram with possible visceral artery embolization and injection of Y-90.

## 2021-06-26 ENCOUNTER — Other Ambulatory Visit (HOSPITAL_COMMUNITY): Payer: Self-pay | Admitting: Interventional Radiology

## 2021-06-26 ENCOUNTER — Encounter (HOSPITAL_COMMUNITY): Payer: Self-pay

## 2021-06-26 ENCOUNTER — Encounter (HOSPITAL_COMMUNITY)
Admission: RE | Admit: 2021-06-26 | Discharge: 2021-06-26 | Disposition: A | Discharge status: Home / Self Care | Payer: Medicaid Other | Source: Ambulatory Visit | Attending: Interventional Radiology | Admitting: Interventional Radiology

## 2021-06-26 ENCOUNTER — Other Ambulatory Visit: Payer: Self-pay | Admitting: Internal Medicine

## 2021-06-26 ENCOUNTER — Other Ambulatory Visit: Payer: Self-pay

## 2021-06-26 ENCOUNTER — Ambulatory Visit (HOSPITAL_COMMUNITY)
Admission: RE | Admit: 2021-06-26 | Discharge: 2021-06-26 | Disposition: A | Discharge status: Home / Self Care | Payer: Medicaid Other | Source: Ambulatory Visit | Attending: Interventional Radiology | Admitting: Interventional Radiology

## 2021-06-26 DIAGNOSIS — R160 Hepatomegaly, not elsewhere classified: Secondary | ICD-10-CM

## 2021-06-26 DIAGNOSIS — F17210 Nicotine dependence, cigarettes, uncomplicated: Secondary | ICD-10-CM | POA: Diagnosis not present

## 2021-06-26 DIAGNOSIS — F1011 Alcohol abuse, in remission: Secondary | ICD-10-CM | POA: Insufficient documentation

## 2021-06-26 DIAGNOSIS — C220 Liver cell carcinoma: Secondary | ICD-10-CM

## 2021-06-26 DIAGNOSIS — D649 Anemia, unspecified: Secondary | ICD-10-CM | POA: Insufficient documentation

## 2021-06-26 DIAGNOSIS — F32A Depression, unspecified: Secondary | ICD-10-CM | POA: Diagnosis not present

## 2021-06-26 DIAGNOSIS — M109 Gout, unspecified: Secondary | ICD-10-CM | POA: Diagnosis not present

## 2021-06-26 DIAGNOSIS — Z79899 Other long term (current) drug therapy: Secondary | ICD-10-CM | POA: Diagnosis not present

## 2021-06-26 DIAGNOSIS — K7460 Unspecified cirrhosis of liver: Secondary | ICD-10-CM | POA: Insufficient documentation

## 2021-06-26 DIAGNOSIS — F1410 Cocaine abuse, uncomplicated: Secondary | ICD-10-CM | POA: Diagnosis not present

## 2021-06-26 DIAGNOSIS — Z8546 Personal history of malignant neoplasm of prostate: Secondary | ICD-10-CM | POA: Insufficient documentation

## 2021-06-26 DIAGNOSIS — Z7982 Long term (current) use of aspirin: Secondary | ICD-10-CM | POA: Insufficient documentation

## 2021-06-26 DIAGNOSIS — Z9221 Personal history of antineoplastic chemotherapy: Secondary | ICD-10-CM | POA: Diagnosis not present

## 2021-06-26 HISTORY — PX: IR ANGIOGRAM SELECTIVE EACH ADDITIONAL VESSEL: IMG667

## 2021-06-26 HISTORY — PX: IR ANGIOGRAM VISCERAL SELECTIVE: IMG657

## 2021-06-26 HISTORY — PX: IR EMBO TUMOR ORGAN ISCHEMIA INFARCT INC GUIDE ROADMAPPING: IMG5449

## 2021-06-26 HISTORY — PX: IR US GUIDE VASC ACCESS RIGHT: IMG2390

## 2021-06-26 LAB — CBC WITH DIFFERENTIAL/PLATELET
Abs Immature Granulocytes: 1.7 10*3/uL — ABNORMAL HIGH (ref 0.00–0.07)
Basophils Absolute: 0.1 10*3/uL (ref 0.0–0.1)
Basophils Relative: 0 %
Eosinophils Absolute: 0.2 10*3/uL (ref 0.0–0.5)
Eosinophils Relative: 1 %
HCT: 36.1 % — ABNORMAL LOW (ref 39.0–52.0)
Hemoglobin: 12 g/dL — ABNORMAL LOW (ref 13.0–17.0)
Immature Granulocytes: 9 %
Lymphocytes Relative: 8 %
Lymphs Abs: 1.5 10*3/uL (ref 0.7–4.0)
MCH: 30.7 pg (ref 26.0–34.0)
MCHC: 33.2 g/dL (ref 30.0–36.0)
MCV: 92.3 fL (ref 80.0–100.0)
Monocytes Absolute: 0.5 10*3/uL (ref 0.1–1.0)
Monocytes Relative: 3 %
Neutro Abs: 14.3 10*3/uL — ABNORMAL HIGH (ref 1.7–7.7)
Neutrophils Relative %: 79 %
Platelets: 117 10*3/uL — ABNORMAL LOW (ref 150–400)
RBC: 3.91 MIL/uL — ABNORMAL LOW (ref 4.22–5.81)
RDW: 16.3 % — ABNORMAL HIGH (ref 11.5–15.5)
WBC: 18.2 10*3/uL — ABNORMAL HIGH (ref 4.0–10.5)
nRBC: 0 % (ref 0.0–0.2)

## 2021-06-26 LAB — COMPREHENSIVE METABOLIC PANEL
ALT: 23 U/L (ref 0–44)
AST: 17 U/L (ref 15–41)
Albumin: 3.9 g/dL (ref 3.5–5.0)
Alkaline Phosphatase: 112 U/L (ref 38–126)
Anion gap: 8 (ref 5–15)
BUN: 10 mg/dL (ref 6–20)
CO2: 22 mmol/L (ref 22–32)
Calcium: 8.7 mg/dL — ABNORMAL LOW (ref 8.9–10.3)
Chloride: 109 mmol/L (ref 98–111)
Creatinine, Ser: 0.49 mg/dL — ABNORMAL LOW (ref 0.61–1.24)
GFR, Estimated: >60 mL/min (ref >60)
Glucose, Bld: 91 mg/dL (ref 70–99)
Potassium: 3.5 mmol/L (ref 3.5–5.1)
Sodium: 139 mmol/L (ref 135–145)
Total Bilirubin: 1 mg/dL (ref 0.3–1.2)
Total Protein: 7.3 g/dL (ref 6.5–8.1)

## 2021-06-26 LAB — PROTIME-INR
INR: 1 (ref 0.8–1.2)
Prothrombin Time: 13 seconds (ref 11.4–15.2)

## 2021-06-26 IMAGING — NM NM RADIO PHARM THERAPY INTRA ARTERIAL
3 series · 18 of 18 positions shown · non-contrast
Comparison: MAA scan [DATE].  MRI [DATE].

CLINICAL DATA: Hepatocellular carcinoma. Large tumor in the RIGHT
hepatic lobe. Yttrium 90 radio embolization to the RIGHT hepatic
lobe tumor

EXAM:
NUCLEAR MEDICINE SPECIAL MED RAD PHYSICS CONS; NUCLEAR MEDICINE
RADIO PHARM THERAPY INTRA ARTERIAL; NUCLEAR MEDICINE TREATMENT
PROCEDURE; NUCLEAR MEDICINE LIVER SCAN
TECHNIQUE: In conjunction with the interventional radiologist a Y- Microsphere
dose was calculated utilizing body surface area formulation as well
as partition formulation. Post processing 3D volume calculations
performed. Calculated dose equal 33.3 mCi. Pre therapy MAA liver
SPECT scan and CTA were evaluated. Utilizing a microcatheter system,
the hepatic artery was selected and Y-90 microspheres were delivered
in fractionated aliquots. Radiopharmaceutical was delivered by the
interventional radiologist and nuclear radiologist.
The patient tolerated procedure well. No adverse effects were noted.
Bremsstrahlung planar and SPECT imaging of the abdomen following
intrahepatic arterial delivery of Y-90 microsphere was performed.
RADIOPHARMACEUTICALS:  35.3 millicuries yttrium 90 microspheres

[Series 1: spect - (id) _(id)_tra · 4.1mm · 4.14mm/px · 6 of 128 frames shown]
[frame 11/128]
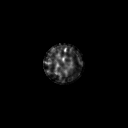
[frame 32/128]
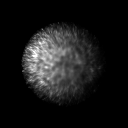
[frame 54/128]
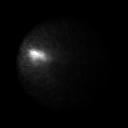
[frame 75/128]
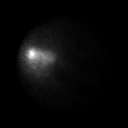
[frame 96/128]
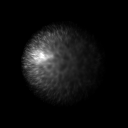
[frame 118/128]
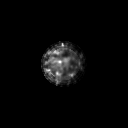

[Series 1: spect - (id) _(id)_cor · 4.1mm · 4.14mm/px · 6 of 128 frames shown]
[frame 11/128]
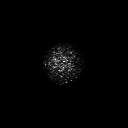
[frame 32/128]
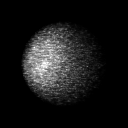
[frame 54/128]
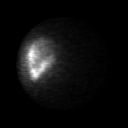
[frame 75/128]
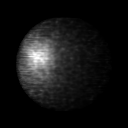
[frame 96/128]
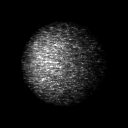
[frame 118/128]
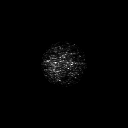

[Series 2: y-90 spect · 4.14mm/px · 6 of 64 frames shown]
[frame 6/64]
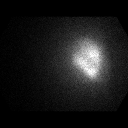
[frame 16/64]
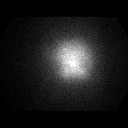
[frame 27/64]
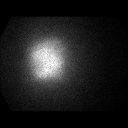
[frame 38/64]
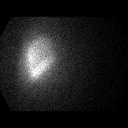
[frame 48/64]
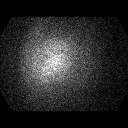
[frame 59/64]
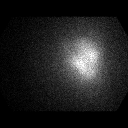

[18 of 18 positions shown; findings below may reference images not displayed]

FINDINGS: [AGE] microspheres therapy as above. First therapy the right
hepatic lobe.

Bremsstrahlung planar and SPECT imaging of the abdomen following
intrahepatic arterial delivery of [T6] demonstrates
radioactivity localized to the RIGHT hepatic lobe. No evidence of
extrahepatic activity.
IMPRESSION: Successful [AGE] microsphere delivery for treatment of unresectable
liver metastasis. First therapy to the RIGHT lobe.

Bremssstrahlung scan demonstrates activity localized to RIGHT
hepatic lobe with no extrahepatic activity identified.

## 2021-06-26 IMAGING — NM NM LIVER IMG SPECT
3 series · 18 of 18 positions shown · non-contrast
Comparison: MAA scan [DATE].  MRI [DATE].

CLINICAL DATA: Hepatocellular carcinoma. Large tumor in the RIGHT
hepatic lobe. Yttrium 90 radio embolization to the RIGHT hepatic
lobe tumor

EXAM:
NUCLEAR MEDICINE SPECIAL MED RAD PHYSICS CONS; NUCLEAR MEDICINE
RADIO PHARM THERAPY INTRA ARTERIAL; NUCLEAR MEDICINE TREATMENT
PROCEDURE; NUCLEAR MEDICINE LIVER SCAN
TECHNIQUE: In conjunction with the interventional radiologist a Y- Microsphere
dose was calculated utilizing body surface area formulation as well
as partition formulation. Post processing 3D volume calculations
performed. Calculated dose equal 33.3 mCi. Pre therapy MAA liver
SPECT scan and CTA were evaluated. Utilizing a microcatheter system,
the hepatic artery was selected and Y-90 microspheres were delivered
in fractionated aliquots. Radiopharmaceutical was delivered by the
interventional radiologist and nuclear radiologist.
The patient tolerated procedure well. No adverse effects were noted.
Bremsstrahlung planar and SPECT imaging of the abdomen following
intrahepatic arterial delivery of Y-90 microsphere was performed.
RADIOPHARMACEUTICALS:  35.3 millicuries yttrium 90 microspheres

[Series 1: spect - (id) _(id)_tra · 4.1mm · 4.14mm/px · 6 of 128 frames shown]
[frame 11/128]
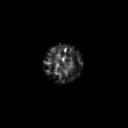
[frame 32/128]
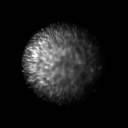
[frame 54/128]
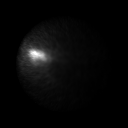
[frame 75/128]
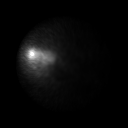
[frame 96/128]
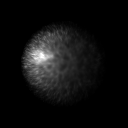
[frame 118/128]
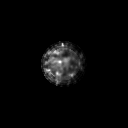

[Series 1: spect - (id) _(id)_cor · 4.1mm · 4.14mm/px · 6 of 128 frames shown]
[frame 11/128]
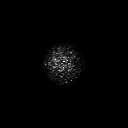
[frame 32/128]
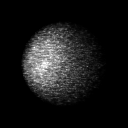
[frame 54/128]
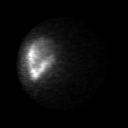
[frame 75/128]
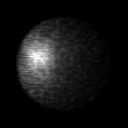
[frame 96/128]
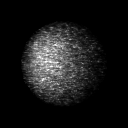
[frame 118/128]
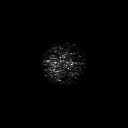

[Series 2: y-90 spect · 4.14mm/px · 6 of 64 frames shown]
[frame 6/64]
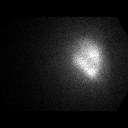
[frame 16/64]
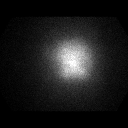
[frame 27/64]
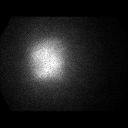
[frame 38/64]
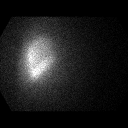
[frame 48/64]
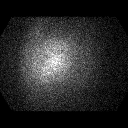
[frame 59/64]
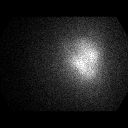

[18 of 18 positions shown; findings below may reference images not displayed]

FINDINGS: [AGE] microspheres therapy as above. First therapy the right
hepatic lobe.

Bremsstrahlung planar and SPECT imaging of the abdomen following
intrahepatic arterial delivery of [T6] demonstrates
radioactivity localized to the RIGHT hepatic lobe. No evidence of
extrahepatic activity.
IMPRESSION: Successful [AGE] microsphere delivery for treatment of unresectable
liver metastasis. First therapy to the RIGHT lobe.

Bremssstrahlung scan demonstrates activity localized to RIGHT
hepatic lobe with no extrahepatic activity identified.

## 2021-06-26 IMAGING — XA IR EMBO TUMOR ORGAN ISCHEMIA INFARCT INC GUIDE ROADMAPPING
3 series · 13 of 24 positions shown · non-contrast
Comparison: none

INDICATION: 55-year-old male with history of hepatitis C and alcoholic
cirrhosis, metastatic prostate cancer, and large right hepatic mass
compatible with hepatocellular carcinoma given elevated serum alpha
fetoprotein. Presents today for radiation segmentectomy of right
hepatic mass.

[Series 1: processed: ir embo tumor organ ischemia  · 5 acquisitions, 6 frames shown (1 of 2)]
[im 1/5]
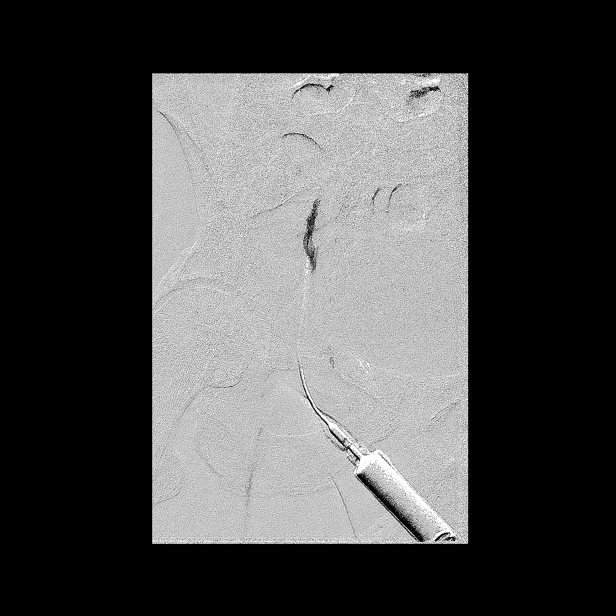
[im 2/5]
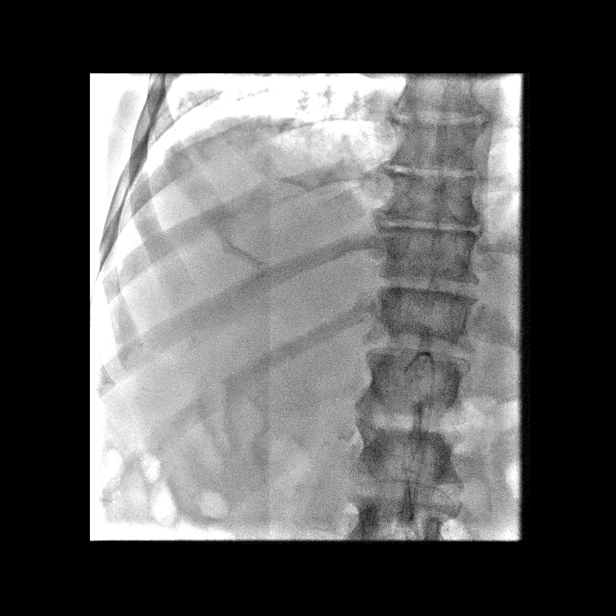
[im 2/5]
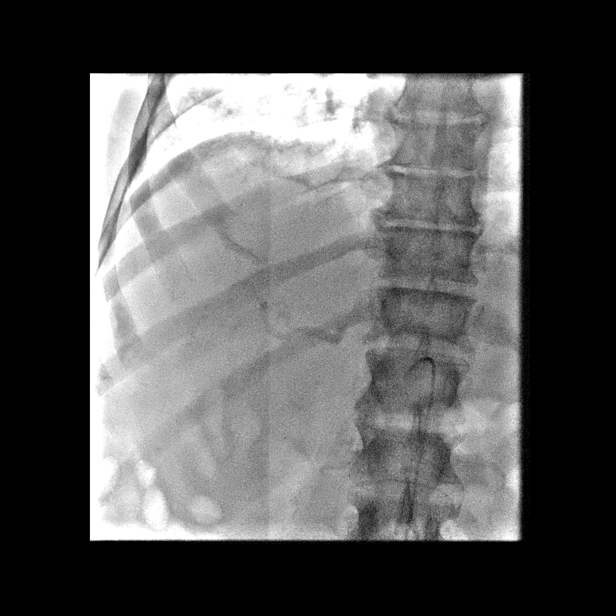
[im 3/5]
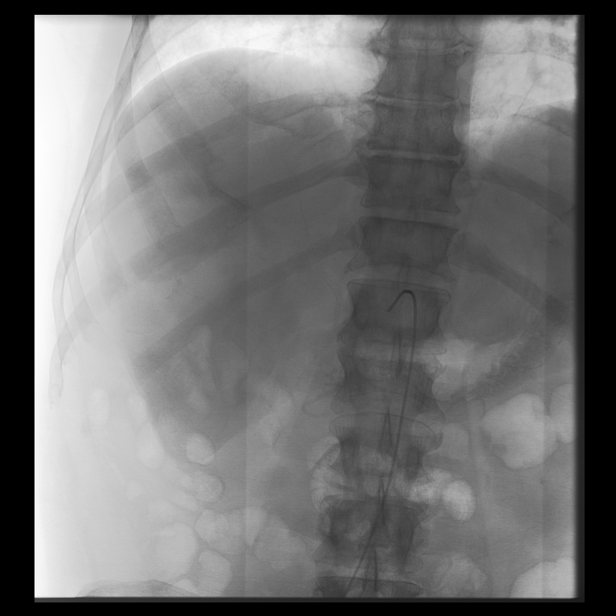
[im 4/5]
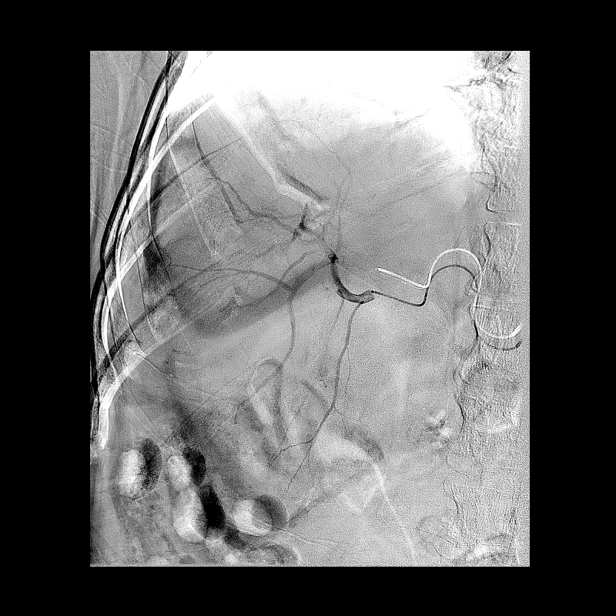
[im 5/5]
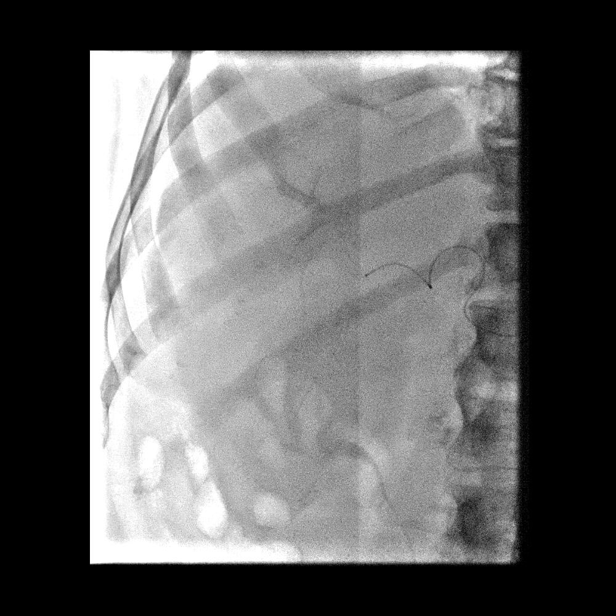

[Series 1: processed: ir embo tumor organ ischemia  · 5 acquisitions, 6 frames shown (2 of 2)]
[im 1/5]
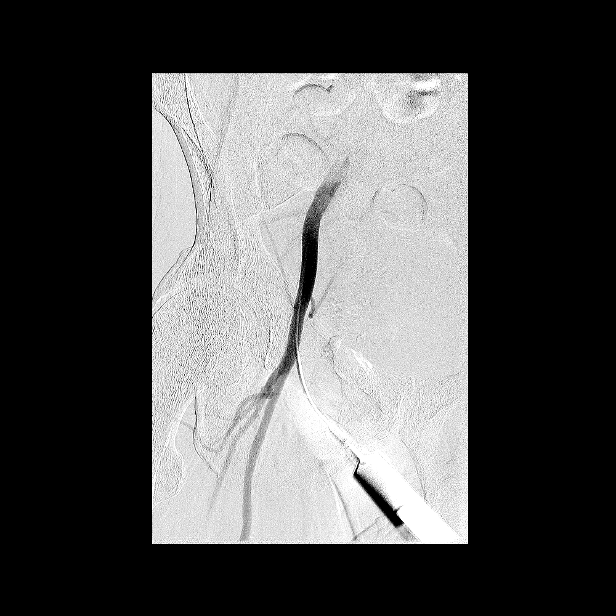
[im 1/5]
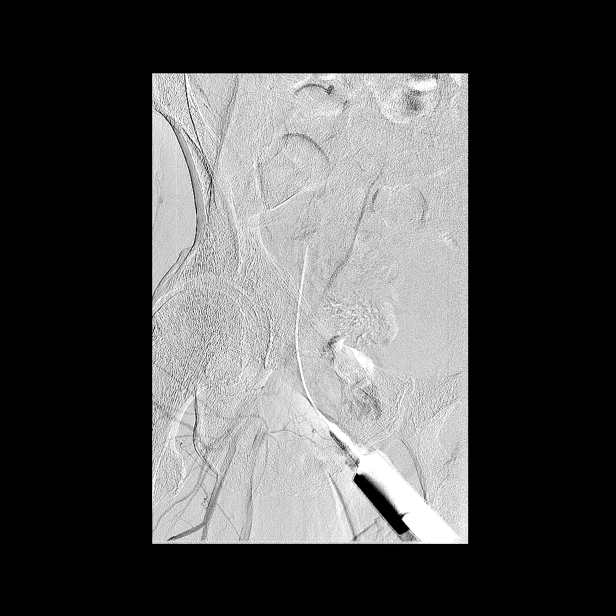
[im 2/5]
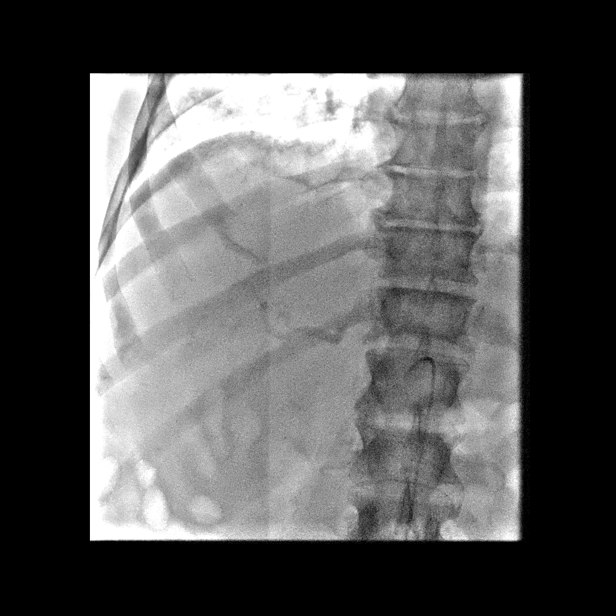
[im 3/5]
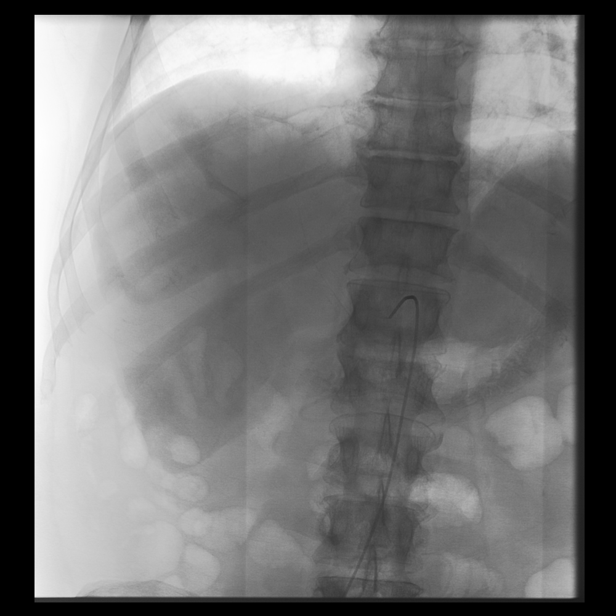
[im 4/5]
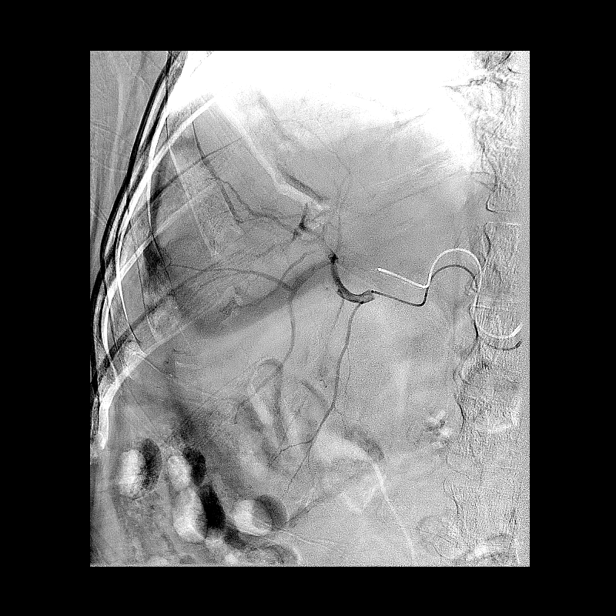
[im 5/5]
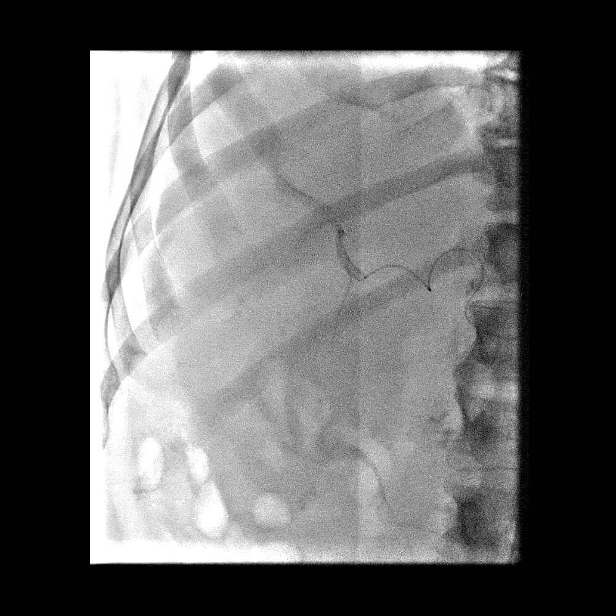

[Series 1: ir (id) (id)/(id)/(id) · 1 of 1 slices shown]
[im 1/1]
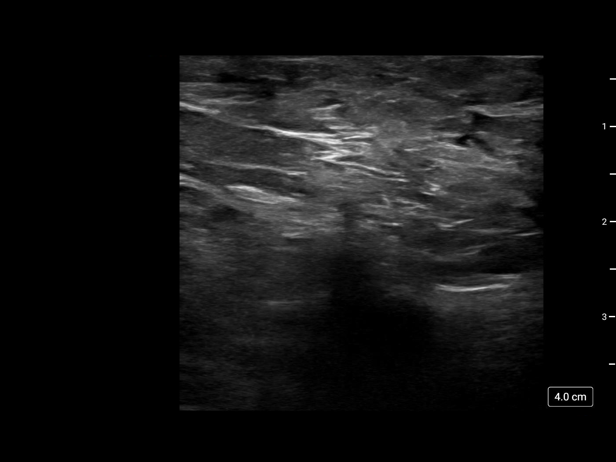

[13 of 24 positions shown; findings below may reference images not displayed]

EXAM:
1. Ultrasound-guided access of the right common femoral artery
2. Catheterization and angiography of the celiac, right, and
posterior right hepatic arteries.
3. Radioembolization of the right posterior hepatic segment using
Y-90 SIRSpheres

MEDICATIONS:
Cefoxitin 2 g, intravenous. The antibiotic was administered within 1
hour of the procedure

33.8 millicuries yttrium 90 microspheres.

ANESTHESIA/SEDATION:
Moderate (conscious) sedation was employed during this procedure. A
total of Versed 4 mg and Fentanyl 200 mcg was administered
intravenously.

Moderate Sedation Time: 48 minutes. The patient's level of
consciousness and vital signs were monitored continuously by
radiology nursing throughout the procedure under my direct
supervision.

CONTRAST:  40mL OMNIPAQUE IOHEXOL 300 MG/ML SOLN, 40mL OMNIPAQUE
IOHEXOL 300 MG/ML SOLN

FLUOROSCOPY TIME:  Fluoroscopy Time: 6 minutes 30 seconds (213 mGy).

COMPLICATIONS:
None immediate.



Preliminary ultrasound of the right groin was performed and
demonstrates a patent right common femoral artery. A permanent
ultrasound image was recorded. Using a combination of fluoroscopy
and ultrasound, an access site was determined. The overlying skin
was anesthetized with 1% lidocaine. A small skin nick was made.
Using ultrasound guidance, access into theright common femoral
artery was obtained with visualization of needle entry into the
vessel using standard micropuncture technique. Limited angiogram of
the common femoral artery demonstrates appropriate access site for
closure device use. A 5 French sheath was placed.

A C2 catheter was advanced into the celiac artery. Celiac
angiography demonstrates conventional anatomy. A Progreat Omega
microcatheter and 0.016" Fathom microwire were used to selectively
catheterize the right hepatic artery. Selective angiography
demonstrates the known mass in the right hepatic lobe, which is the
same mass visualized on recent planning angiogram. These findings
are consistent with a focus of hepatocellular carcinoma. There is no
evidence of non target extrahepatic branches.

Working with the authorized user, Dr. PLANFECTION, the
previously prescribed [AGE] dose was injected into the arterials
supplying the mass. Flow to the lesion was neither slowed nor
occluded. The catheters were withdrawn and stowed as per radiation
safety protocol.

Hemostasis was achieved using an Angio-Seal device. There were no
immediate complications. Peripheral pulses were unchanged. The
patient was transferred to the recovery area in stable condition.
IMPRESSION: Technically successful transarterial radioembolization segmentectomy
of the right hepatic mass.

## 2021-06-26 IMAGING — XA IR US GUIDE VASC ACCESS RIGHT
2 series · 13 of 21 positions shown · non-contrast
Comparison: none

INDICATION: 55-year-old male with history of hepatitis C and alcoholic
cirrhosis, metastatic prostate cancer, and large right hepatic mass
compatible with hepatocellular carcinoma given elevated serum alpha
fetoprotein. Presents today for radiation segmentectomy of right
hepatic mass.

[Series 1: ir (id) (id)/(id)/(id) · 1 of 1 slices shown]
[im 1/1]
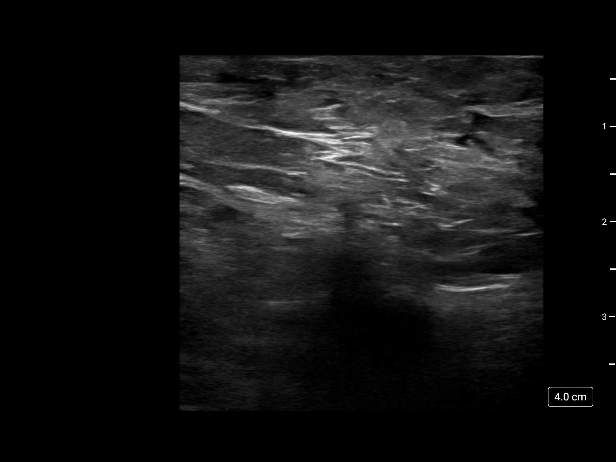

[Series 1: processed: ir embo tumor organ ischemia  · 5 acquisitions, 12 frames shown]
[im 1/5]
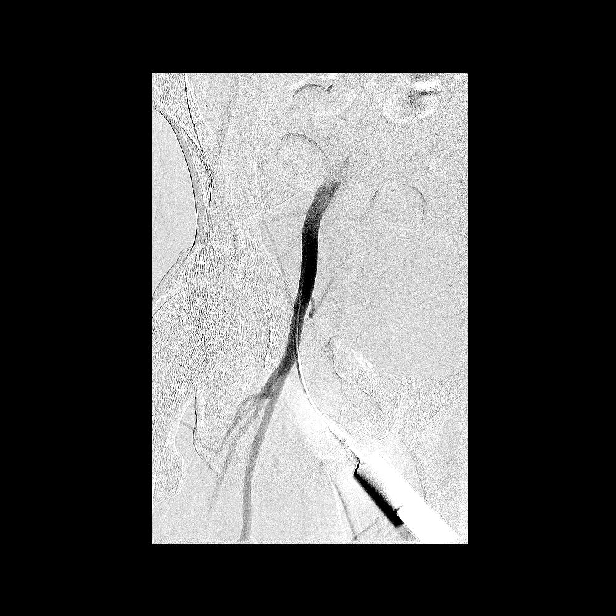
[im 1/5]
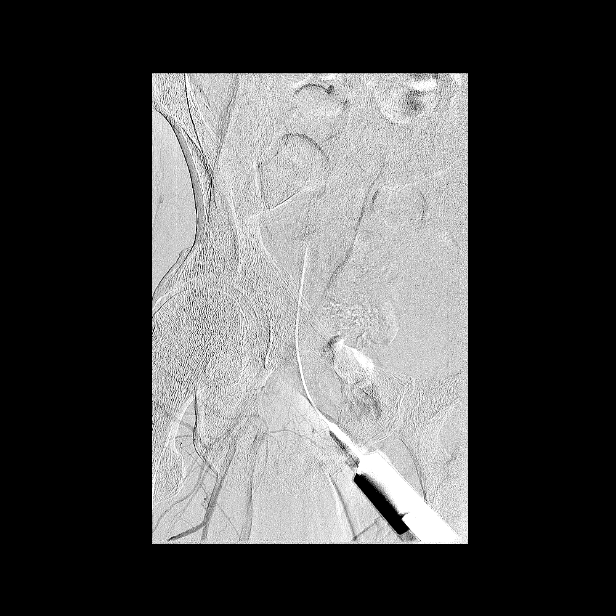
[im 2/5]
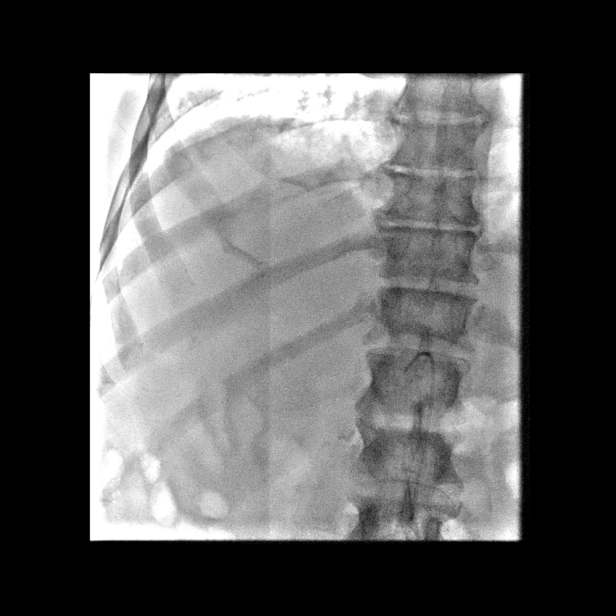
[im 2/5]
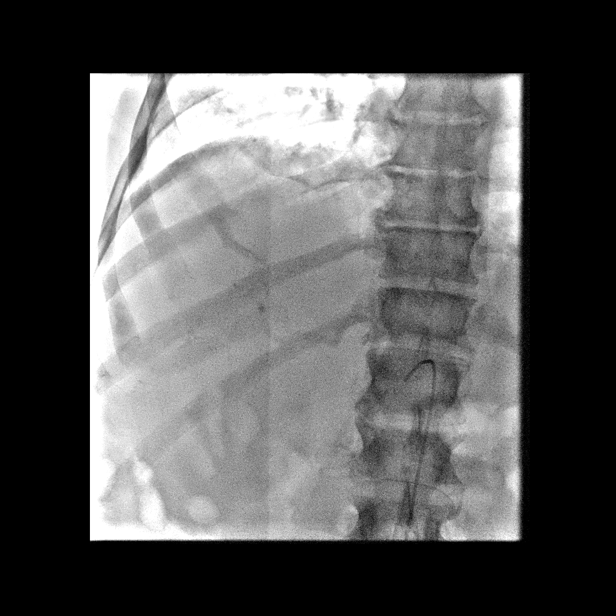
[im 2/5]
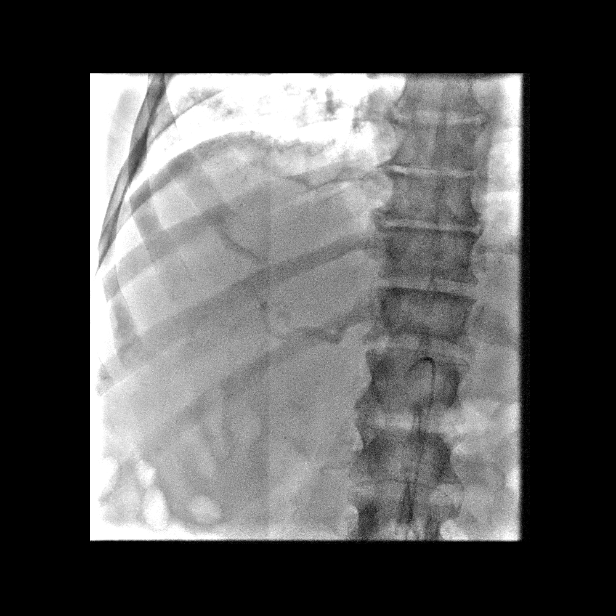
[im 3/5]
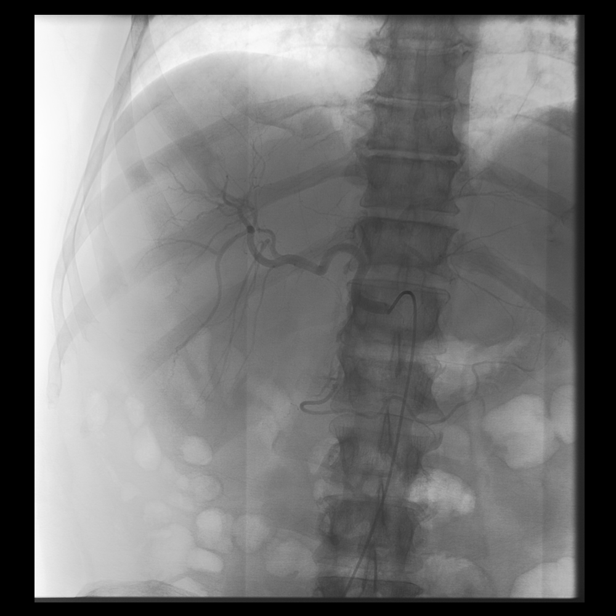
[im 3/5]
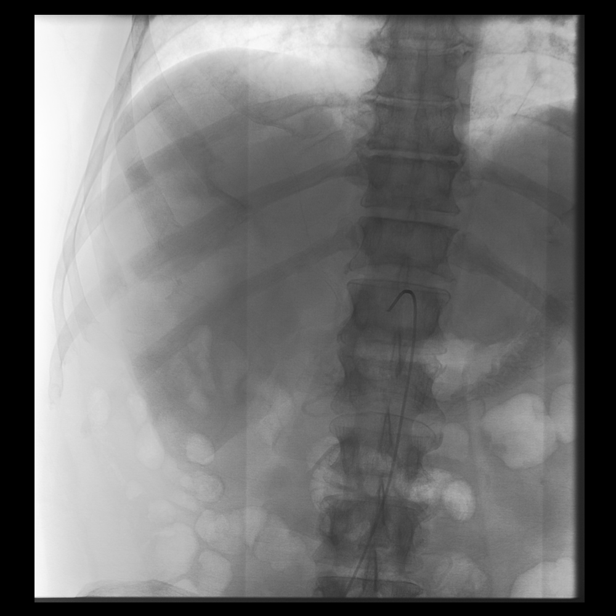
[im 4/5]
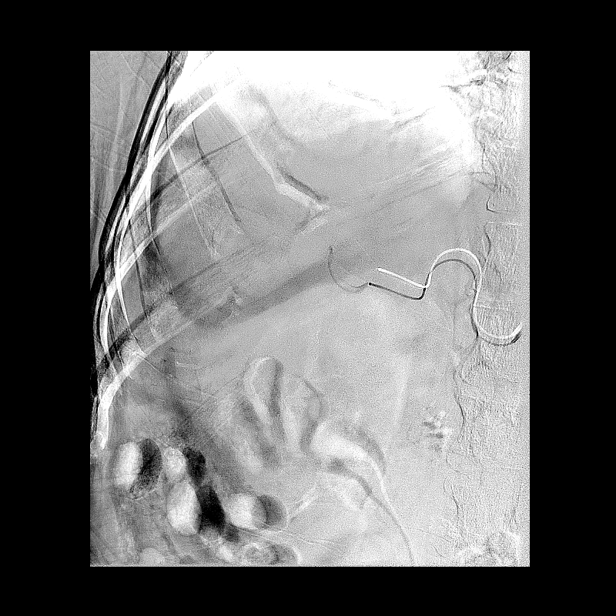
[im 4/5]
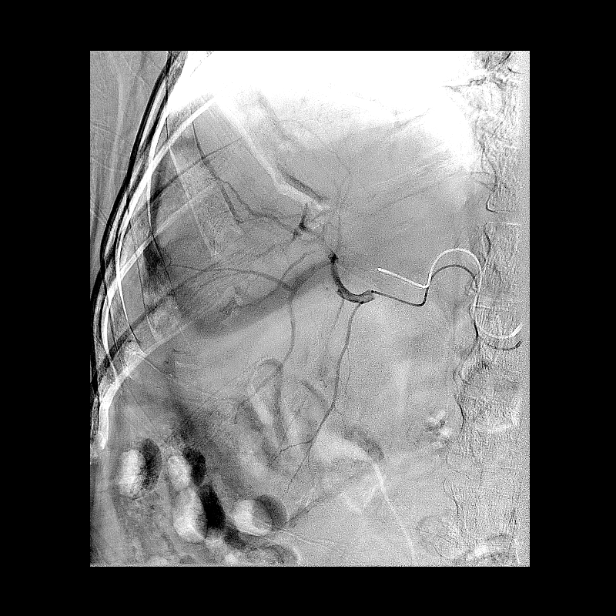
[im 4/5]
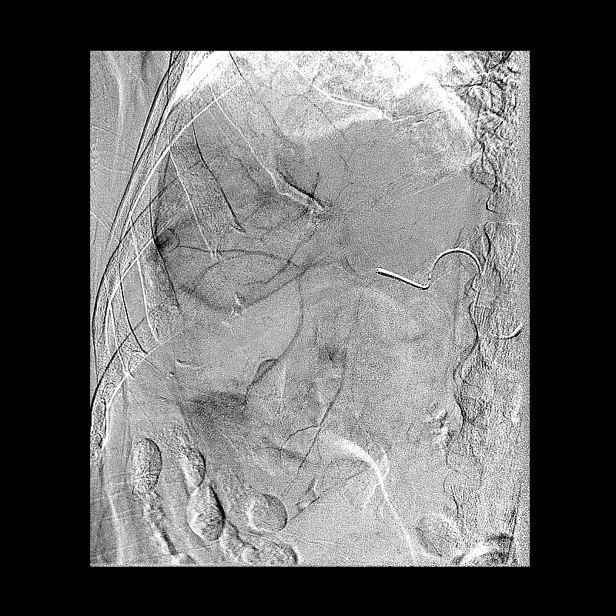
[im 5/5]
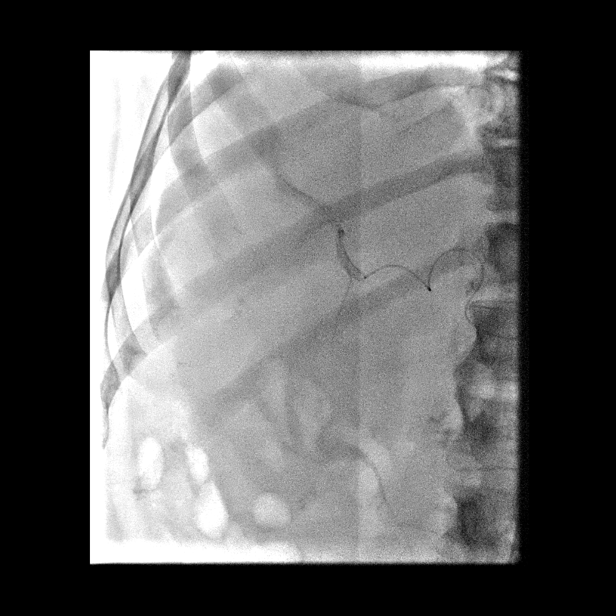
[im 5/5]
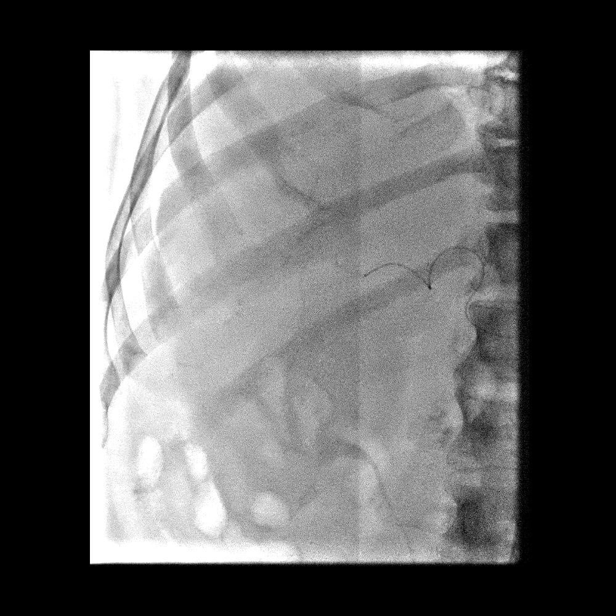

[13 of 21 positions shown; findings below may reference images not displayed]

EXAM:
1. Ultrasound-guided access of the right common femoral artery
2. Catheterization and angiography of the celiac, right, and
posterior right hepatic arteries.
3. Radioembolization of the right posterior hepatic segment using
Y-90 SIRSpheres

MEDICATIONS:
Cefoxitin 2 g, intravenous. The antibiotic was administered within 1
hour of the procedure

33.8 millicuries yttrium 90 microspheres.

ANESTHESIA/SEDATION:
Moderate (conscious) sedation was employed during this procedure. A
total of Versed 4 mg and Fentanyl 200 mcg was administered
intravenously.

Moderate Sedation Time: 48 minutes. The patient's level of
consciousness and vital signs were monitored continuously by
radiology nursing throughout the procedure under my direct
supervision.

CONTRAST:  40mL OMNIPAQUE IOHEXOL 300 MG/ML SOLN, 40mL OMNIPAQUE
IOHEXOL 300 MG/ML SOLN

FLUOROSCOPY TIME:  Fluoroscopy Time: 6 minutes 30 seconds (213 mGy).

COMPLICATIONS:
None immediate.



Preliminary ultrasound of the right groin was performed and
demonstrates a patent right common femoral artery. A permanent
ultrasound image was recorded. Using a combination of fluoroscopy
and ultrasound, an access site was determined. The overlying skin
was anesthetized with 1% lidocaine. A small skin nick was made.
Using ultrasound guidance, access into theright common femoral
artery was obtained with visualization of needle entry into the
vessel using standard micropuncture technique. Limited angiogram of
the common femoral artery demonstrates appropriate access site for
closure device use. A 5 French sheath was placed.

A C2 catheter was advanced into the celiac artery. Celiac
angiography demonstrates conventional anatomy. A Progreat Omega
microcatheter and 0.016" Fathom microwire were used to selectively
catheterize the right hepatic artery. Selective angiography
demonstrates the known mass in the right hepatic lobe, which is the
same mass visualized on recent planning angiogram. These findings
are consistent with a focus of hepatocellular carcinoma. There is no
evidence of non target extrahepatic branches.

Working with the authorized user, Dr. PLANFECTION, the
previously prescribed [AGE] dose was injected into the arterials
supplying the mass. Flow to the lesion was neither slowed nor
occluded. The catheters were withdrawn and stowed as per radiation
safety protocol.

Hemostasis was achieved using an Angio-Seal device. There were no
immediate complications. Peripheral pulses were unchanged. The
patient was transferred to the recovery area in stable condition.
IMPRESSION: Technically successful transarterial radioembolization segmentectomy
of the right hepatic mass.

## 2021-06-26 MED ORDER — MIDAZOLAM HCL 2 MG/2ML IJ SOLN
INTRAMUSCULAR | Status: AC | PRN
  Administered 2021-06-26: 1 mg via INTRAVENOUS

## 2021-06-26 MED ORDER — SODIUM CHLORIDE 0.9 % IV SOLN
8.0000 mg | Freq: Once | INTRAVENOUS | Status: AC
  Administered 2021-06-26: 8 mg via INTRAVENOUS
  Filled 2021-06-26: qty 4

## 2021-06-26 MED ORDER — FENTANYL CITRATE (PF) 100 MCG/2ML IJ SOLN
INTRAMUSCULAR | Status: AC | PRN
Start: 2021-06-26 — End: 2021-06-26
  Administered 2021-06-26: 50 ug via INTRAVENOUS

## 2021-06-26 MED ORDER — FENTANYL CITRATE (PF) 100 MCG/2ML IJ SOLN
INTRAMUSCULAR | Status: AC | PRN
  Administered 2021-06-26: 50 ug via INTRAVENOUS

## 2021-06-26 MED ORDER — PANTOPRAZOLE SODIUM 40 MG IV SOLR
40.0000 mg | Freq: Once | INTRAVENOUS | Status: AC
  Administered 2021-06-26: 40 mg via INTRAVENOUS
  Filled 2021-06-26: qty 10

## 2021-06-26 MED ORDER — LIDOCAINE HCL (PF) 1 % IJ SOLN
INTRAMUSCULAR | Status: AC | PRN
  Administered 2021-06-26: 10 mL via INTRADERMAL

## 2021-06-26 MED ORDER — YTTRIUM 90 INJECTION
33.8000 | INJECTION | Freq: Once | INTRAVENOUS | Status: DC
Start: 2021-06-26 — End: 2021-07-02

## 2021-06-26 MED ORDER — MIDAZOLAM HCL 2 MG/2ML IJ SOLN
INTRAMUSCULAR | Status: AC | PRN
Start: 2021-06-26 — End: 2021-06-26
  Administered 2021-06-26: 1 mg via INTRAVENOUS

## 2021-06-26 MED ORDER — IOHEXOL 300 MG/ML  SOLN
100.0000 mL | Freq: Once | INTRAMUSCULAR | Status: AC | PRN
  Administered 2021-06-26: 40 mL via INTRA_ARTERIAL

## 2021-06-26 MED ORDER — DEXAMETHASONE SODIUM PHOSPHATE 10 MG/ML IJ SOLN
8.0000 mg | Freq: Once | INTRAMUSCULAR | Status: AC
Start: 2021-06-26 — End: 2021-06-26
  Administered 2021-06-26: 8 mg via INTRAVENOUS
  Filled 2021-06-26: qty 1

## 2021-06-26 MED ORDER — SODIUM CHLORIDE 0.9 % IV SOLN
2.0000 g | INTRAVENOUS | Status: AC
  Administered 2021-06-26: 2 g via INTRAVENOUS
  Filled 2021-06-26: qty 2

## 2021-06-26 MED ORDER — SODIUM CHLORIDE 0.9 % IV SOLN: INTRAVENOUS | Status: DC

## 2021-06-26 NOTE — Discharge Instructions (Signed)
Please call Interventional Radiology clinic [phone] with any questions or concerns.  You may remove your dressing and shower tomorrow.    Hepatic Artery Radioembolization, Care After The following information offers guidance on how to care for yourself after your procedure. Your health care provider may also give you more specific instructions. If you have problems or questions, contact your health care provider. What can I expect after the procedure? After the procedure, it is possible to have: A slight fever for 7 to 10 days. This may be accompanied by pain, nausea, or vomiting, which is referred to as post-embolization syndrome. You may be given medicine to help relieve these symptoms. If your fever gets worse, tell your health care provider. Tiredness (fatigue). Loss of appetite. This should gradually improve after about 1 week. Abdominal pain on your right side. Soreness and tenderness in your groin area where the needle and catheter were placed (puncture site). Follow these instructions at home: Puncture site care Follow instructions from your health care provider about how to take care of the puncture site. Make sure you: Wash your hands with soap and water for at least 20 seconds before and after you change your bandage (dressing). If soap and water are not available, use hand sanitizer. Change your dressing as told by your health care provider. Check your puncture site every day for signs of infection. Check for: More redness, swelling, or pain. Fluid or blood. Warmth. Pus or a bad smell. Activity Rest as told by your health care provider. Return to your normal activities as told by your health care provider. Ask your health care provider what activities are safe for you. Avoid sitting for a long time without moving. Get up to take short walks every 1-2 hours. This is important to improve blood flow and breathing. Ask for help if you feel weak or unsteady. If you were given  a sedative during the procedure, it can affect you for several hours. Do not drive or operate machinery until your health care provider says that it is safe. Do not lift anything that is heavier than 10 lb (4.5 kg), or the limit that you are told, until your health care provider says that it is safe. Medicines Take over-the-counter and prescription medicines only as told by your health care provider. Ask your health care provider if the medicine prescribed to you: Requires you to avoid driving or using machinery. Can cause constipation. You may need to take these actions to prevent or treat constipation: Drink enough fluid to keep your urine pale yellow. Take over-the-counter or prescription medicines. Eat foods that are high in fiber, such as beans, whole grains, and fresh fruits and vegetables. Limit foods that are high in fat and processed sugars, such as fried or sweet foods. General instructions Eat frequent, small meals until your appetite returns. Follow instructions from your health care provider about eating or drinking restrictions. Do not take baths, swim, or use a hot tub until your health care provider approves. You may take showers. Wash your puncture site with mild soap and water, and pat the area dry. Wear compression stockings as told by your health care provider. These stockings help to prevent blood clots and reduce swelling in your legs. Keep all follow-up visits. This is important. You may need to have blood tests and imaging tests. Contact a health care provider if: You have any of these signs of infection: More redness, swelling, or pain around your puncture site. Fluid or blood coming from your  puncture site. Warmth coming from your puncture site. Pus or a bad smell coming from your puncture site. You have pain that: Gets worse. Does not get better with medicine. Feels like very bad heartburn. Is in the middle of your abdomen, above your belly button. You have any  signs of infection or liver failure, such as: Your skin or the white parts of your eyes turn yellow (jaundice). The color of your urine changes to dark brown. The color of your stool (feces) changes to light yellow. Your abdominal measurement (girth) increases in a short period of time. You gain more than 5 lb (2.3 kg) in a short period of time. Get help right away if: You have a fever that lasts more than 10 days or is higher than what your health care provider told you to expect. You develop any of the following in your legs: Pain. Swelling. Skin that is cold or pale or turns blue. You have chest pain. You have blood in your vomit, saliva, or stool. You have trouble breathing. These symptoms may represent a serious problem that is an emergency. Do not wait to see if the symptoms will go away. Get medical help right away. Call your local emergency services (911 in the [place] Do not drive yourself to the hospital. Summary After the procedure, it is possible to have a slight fever for up to 7-10 days, tiredness, loss of appetite, abdominal pain on the right side, and groin tenderness where the catheter was placed. Do not come in close contact with people for up to a week after your procedure, as told by your health care provider. Follow instructions from your health care provider about how to take care of the puncture site. Contact a health care provider if you have any signs of infection. Get help right away if you develop pain or swelling in your legs or if your legs feel cool or look pale. This information is not intended to replace advice given to you by your health care provider. Make sure you discuss any questions you have with your health care provider. Document Revised: 03/06/2020 Document Reviewed: 03/06/2020 Elsevier Patient Education  2022 [place].      Moderate Conscious Sedation, Adult, Care After This sheet gives you information about how to care for yourself after  your procedure. Your health care provider may also give you more specific instructions. If you have problems or questions, contact your health care provider. What can I expect after the procedure? After the procedure, it is common to have: Sleepiness for several hours. Impaired judgment for several hours. Difficulty with balance. Vomiting if you eat too soon. Follow these instructions at home: For the time period you were told by your health care provider: Rest. Do not participate in activities where you could fall or become injured. Do not drive or use machinery. Do not drink alcohol. Do not take sleeping pills or medicines that cause drowsiness. Do not make important decisions or sign legal documents. Do not take care of children on your own.      Eating and drinking Follow the diet recommended by your health care provider. Drink enough fluid to keep your urine pale yellow. If you vomit: Drink water, juice, or soup when you can drink without vomiting. Make sure you have little or no nausea before eating solid foods.   General instructions Take over-the-counter and prescription medicines only as told by your health care provider. Have a responsible adult stay with you for the time  you are told. It is important to have someone help care for you until you are awake and alert. Do not smoke. Keep all follow-up visits as told by your health care provider. This is important. Contact a health care provider if: You are still sleepy or having trouble with balance after 24 hours. You feel light-headed. You keep feeling nauseous or you keep vomiting. You develop a rash. You have a fever. You have redness or swelling around the IV site. Get help right away if: You have trouble breathing. You have new-onset confusion at home. Summary After the procedure, it is common to feel sleepy, have impaired judgment, or feel nauseous if you eat too soon. Rest after you get home. Know the things you  should not do after the procedure. Follow the diet recommended by your health care provider and drink enough fluid to keep your urine pale yellow. Get help right away if you have trouble breathing or new-onset confusion at home. This information is not intended to replace advice given to you by your health care provider. Make sure you discuss any questions you have with your health care provider. Document Revised: 07/31/2019 Document Reviewed: 02/26/2019 Elsevier Patient Education  2021 [place]:  For up to a week after your procedure, there will be a small amount of radioactivity near your liver. This is not especially dangerous to other people. However, as told by your health care provider, you should follow these precautions for 7 days: Do not come in close contact with people. Do not sleep in the same bed as someone else. Do not hold children or babies. Do not have contact with pregnant women.  Post Y-90 Radioembolization Discharge Instructions  You have been given a radioactive material during your procedure.  While it is safe for you to be discharged home from the hospital, you need to proceed directly home.    Do not use public transportation, including air travel, lasting more than 2 hours for 1 week.  Avoid crowded public places for 1 week.  Adult visitors should try to avoid close contact with you for 1 week.    Children and pregnant females should not visit or have close contact with you for 1 week.  Items that you touch are not radioactive.  Do not sleep in the same bed as your partner for 1 week, and a condom should be used for sexual activity during the first 24 hours.  Your blood may be radioactive and caution should be used if any bleeding occurs during the recovery period.  Body fluids may be radioactive for 24 hours.  Wash your hands after voiding.  Men should sit to urinate.  Dispose of any soiled materials (flush down toilet or  place in trash at home) during the first day.  Drink 6 to 8 glasses of fluids per day for 5 days to hydrate yourself.  If you need to see a doctor during the first week, you must let them know that you were treated with yttrium-90 microspheres, and will be slightly radioactive.  They can call Interventional Radiology [phone] with any questions.

## 2021-06-26 NOTE — Procedures (Signed)
Interventional Radiology Procedure Note ? ?Procedure:  Right hepatic transarterial radioembolization segmentectomy ? ?Findings: Please refer to procedural dictation for full description. Right inferior mass Y-90 administration. ? ?Complications: None immediate ? ?Estimated Blood Loss:  < 5 mL ? ?Recommendations: ?-Strict 4 hour bedrest (2 hours flat until 13:15, 2 hours head of bed up to 30 degrees until 15:15). ?-IR will arrange for 2-3 week clinic follow up ? ? ?Ruthann Cancer, MD ?Pager: [phone] ? ? ? ?

## 2021-06-28 ENCOUNTER — Other Ambulatory Visit (HOSPITAL_COMMUNITY): Payer: Self-pay | Admitting: Interventional Radiology

## 2021-06-28 ENCOUNTER — Encounter (HOSPITAL_COMMUNITY): Payer: Self-pay

## 2021-06-28 DIAGNOSIS — R160 Hepatomegaly, not elsewhere classified: Secondary | ICD-10-CM

## 2021-06-28 HISTORY — PX: IR ANGIOGRAM SELECTIVE EACH ADDITIONAL VESSEL: IMG667

## 2021-06-30 ENCOUNTER — Telehealth: Payer: Self-pay

## 2021-06-30 ENCOUNTER — Other Ambulatory Visit: Payer: Self-pay | Admitting: Nurse Practitioner

## 2021-06-30 DIAGNOSIS — C61 Malignant neoplasm of prostate: Secondary | ICD-10-CM

## 2021-06-30 MED ORDER — OXYCODONE HCL 5 MG PO TABS: 5.0000 mg | ORAL_TABLET | Freq: Four times a day (QID) | ORAL | 0 refills | Status: DC | PRN

## 2021-06-30 NOTE — Telephone Encounter (Signed)
Patient called in and request a refill of his pain pill, the request was placed on the provider desk. ?

## 2021-07-03 ENCOUNTER — Inpatient Hospital Stay: Payer: Medicaid Other

## 2021-07-04 ENCOUNTER — Inpatient Hospital Stay: Payer: Medicaid Other

## 2021-07-04 ENCOUNTER — Other Ambulatory Visit: Payer: Self-pay

## 2021-07-04 DIAGNOSIS — Z5111 Encounter for antineoplastic chemotherapy: Secondary | ICD-10-CM | POA: Diagnosis not present

## 2021-07-04 DIAGNOSIS — C61 Malignant neoplasm of prostate: Secondary | ICD-10-CM

## 2021-07-04 LAB — CBC WITH DIFFERENTIAL (CANCER CENTER ONLY)
Abs Immature Granulocytes: 0.25 10*3/uL — ABNORMAL HIGH (ref 0.00–0.07)
Basophils Absolute: 0 10*3/uL (ref 0.0–0.1)
Basophils Relative: 0 %
Eosinophils Absolute: 0 10*3/uL (ref 0.0–0.5)
Eosinophils Relative: 0 %
HCT: 33.9 % — ABNORMAL LOW (ref 39.0–52.0)
Hemoglobin: 11.4 g/dL — ABNORMAL LOW (ref 13.0–17.0)
Immature Granulocytes: 2 %
Lymphocytes Relative: 2 %
Lymphs Abs: 0.4 10*3/uL — ABNORMAL LOW (ref 0.7–4.0)
MCH: 30.3 pg (ref 26.0–34.0)
MCHC: 33.6 g/dL (ref 30.0–36.0)
MCV: 90.2 fL (ref 80.0–100.0)
Monocytes Absolute: 0.6 10*3/uL (ref 0.1–1.0)
Monocytes Relative: 4 %
Neutro Abs: 14.5 10*3/uL — ABNORMAL HIGH (ref 1.7–7.7)
Neutrophils Relative %: 92 %
Platelet Count: 110 10*3/uL — ABNORMAL LOW (ref 150–400)
RBC: 3.76 MIL/uL — ABNORMAL LOW (ref 4.22–5.81)
RDW: 16.2 % — ABNORMAL HIGH (ref 11.5–15.5)
WBC Count: 15.7 10*3/uL — ABNORMAL HIGH (ref 4.0–10.5)
nRBC: 0 % (ref 0.0–0.2)

## 2021-07-09 ENCOUNTER — Other Ambulatory Visit: Payer: Self-pay | Admitting: Oncology

## 2021-07-12 ENCOUNTER — Encounter: Payer: Self-pay | Admitting: *Deleted

## 2021-07-12 ENCOUNTER — Inpatient Hospital Stay (HOSPITAL_BASED_OUTPATIENT_CLINIC_OR_DEPARTMENT_OTHER): Payer: Medicaid Other | Admitting: Nurse Practitioner

## 2021-07-12 ENCOUNTER — Inpatient Hospital Stay: Payer: Medicaid Other

## 2021-07-12 ENCOUNTER — Encounter: Payer: Self-pay | Admitting: Nurse Practitioner

## 2021-07-12 ENCOUNTER — Other Ambulatory Visit: Payer: Self-pay

## 2021-07-12 VITALS — BP 142/86 | HR 70 | Temp 97.8°F | Resp 18 | Ht 62.0 in | Wt 153.6 lb

## 2021-07-12 DIAGNOSIS — C61 Malignant neoplasm of prostate: Secondary | ICD-10-CM

## 2021-07-12 DIAGNOSIS — Z5111 Encounter for antineoplastic chemotherapy: Secondary | ICD-10-CM | POA: Diagnosis not present

## 2021-07-12 LAB — CBC WITH DIFFERENTIAL (CANCER CENTER ONLY)
Abs Immature Granulocytes: 0.07 10*3/uL (ref 0.00–0.07)
Basophils Absolute: 0.1 10*3/uL (ref 0.0–0.1)
Basophils Relative: 1 %
Eosinophils Absolute: 0 10*3/uL (ref 0.0–0.5)
Eosinophils Relative: 0 %
HCT: 38 % — ABNORMAL LOW (ref 39.0–52.0)
Hemoglobin: 12.4 g/dL — ABNORMAL LOW (ref 13.0–17.0)
Immature Granulocytes: 1 %
Lymphocytes Relative: 5 %
Lymphs Abs: 0.4 10*3/uL — ABNORMAL LOW (ref 0.7–4.0)
MCH: 30 pg (ref 26.0–34.0)
MCHC: 32.6 g/dL (ref 30.0–36.0)
MCV: 92 fL (ref 80.0–100.0)
Monocytes Absolute: 0.5 10*3/uL (ref 0.1–1.0)
Monocytes Relative: 7 %
Neutro Abs: 5.7 10*3/uL (ref 1.7–7.7)
Neutrophils Relative %: 86 %
Platelet Count: 123 10*3/uL — ABNORMAL LOW (ref 150–400)
RBC: 4.13 MIL/uL — ABNORMAL LOW (ref 4.22–5.81)
RDW: 16.2 % — ABNORMAL HIGH (ref 11.5–15.5)
WBC Count: 6.7 10*3/uL (ref 4.0–10.5)
nRBC: 0 % (ref 0.0–0.2)

## 2021-07-12 LAB — CMP (CANCER CENTER ONLY)
ALT: 35 U/L (ref 0–44)
AST: 22 U/L (ref 15–41)
Albumin: 4.4 g/dL (ref 3.5–5.0)
Alkaline Phosphatase: 142 U/L — ABNORMAL HIGH (ref 38–126)
Anion gap: 11 (ref 5–15)
BUN: 17 mg/dL (ref 6–20)
CO2: 19 mmol/L — ABNORMAL LOW (ref 22–32)
Calcium: 9.3 mg/dL (ref 8.9–10.3)
Chloride: 108 mmol/L (ref 98–111)
Creatinine: 0.66 mg/dL (ref 0.61–1.24)
GFR, Estimated: >60 mL/min (ref >60)
Glucose, Bld: 131 mg/dL — ABNORMAL HIGH (ref 70–99)
Potassium: 4.1 mmol/L (ref 3.5–5.1)
Sodium: 138 mmol/L (ref 135–145)
Total Bilirubin: 0.8 mg/dL (ref 0.3–1.2)
Total Protein: 7.3 g/dL (ref 6.5–8.1)

## 2021-07-12 MED ORDER — SODIUM CHLORIDE 0.9 % IV SOLN
10.0000 mg | Freq: Once | INTRAVENOUS | Status: AC
  Administered 2021-07-12: 10 mg via INTRAVENOUS
  Filled 2021-07-12: qty 1

## 2021-07-12 MED ORDER — SODIUM CHLORIDE 0.9 % IV SOLN
60.0000 mg/m2 | Freq: Once | INTRAVENOUS | Status: AC
  Administered 2021-07-12: 100 mg via INTRAVENOUS
  Filled 2021-07-12: qty 10

## 2021-07-12 MED ORDER — ALLOPURINOL 100 MG PO TABS: ORAL_TABLET | ORAL | 1 refills | Status: DC

## 2021-07-12 MED ORDER — OXYCODONE HCL 5 MG PO TABS: 5.0000 mg | ORAL_TABLET | Freq: Four times a day (QID) | ORAL | 0 refills | Status: DC | PRN

## 2021-07-12 MED ORDER — SODIUM CHLORIDE 0.9 % IV SOLN: Freq: Once | INTRAVENOUS | Status: AC

## 2021-07-12 NOTE — Patient Instructions (Signed)
[facility]   ?Discharge Instructions: ?Thank you for choosing [facility] to provide your oncology and hematology care.  ? ?If you have a lab appointment with the [facility], please go directly to the [facility] and check in at the registration area. ?  ?Wear comfortable clothing and clothing appropriate for easy access to any Portacath or PICC line.  ? ?We strive to give you quality time with your provider. You may need to reschedule your appointment if you arrive late (15 or more minutes).  Arriving late affects you and other patients whose appointments are after yours.  Also, if you miss three or more appointments without notifying the office, you may be dismissed from the clinic at the provider?s discretion.    ?  ?For prescription refill requests, have your pharmacy contact our office and allow 72 hours for refills to be completed.   ? ?Today you received the following chemotherapy and/or immunotherapy agents Docetaxel (TAXOTERE).    ?  ?To help prevent nausea and vomiting after your treatment, we encourage you to take your nausea medication as directed. ? ?BELOW ARE SYMPTOMS THAT SHOULD BE REPORTED IMMEDIATELY: ?*FEVER GREATER THAN 100.4 F (38 ?C) OR HIGHER ?*CHILLS OR SWEATING ?*NAUSEA AND VOMITING THAT IS NOT CONTROLLED WITH YOUR NAUSEA MEDICATION ?*UNUSUAL SHORTNESS OF BREATH ?*UNUSUAL BRUISING OR BLEEDING ?*URINARY PROBLEMS (pain or burning when urinating, or frequent urination) ?*BOWEL PROBLEMS (unusual diarrhea, constipation, pain near the anus) ?TENDERNESS IN MOUTH AND THROAT WITH OR WITHOUT PRESENCE OF ULCERS (sore throat, sores in mouth, or a toothache) ?UNUSUAL RASH, SWELLING OR PAIN  ?UNUSUAL VAGINAL DISCHARGE OR ITCHING  ? ?Items with * indicate a potential emergency and should be followed up as soon as possible or go to the Emergency Department if any problems should occur. ? ?Please show the CHEMOTHERAPY ALERT CARD or IMMUNOTHERAPY ALERT CARD at  check-in to the Emergency Department and triage nurse. ? ?Should you have questions after your visit or need to cancel or reschedule your appointment, please contact [facility]  Dept: [phone]  and follow the prompts.  Office hours are 8:00 a.m. to 4:30 p.m. Monday - Friday. Please note that voicemails left after 4:00 p.m. may not be returned until the following business day.  We are closed weekends and major holidays. You have access to a nurse at all times for urgent questions. Please call the main number to the clinic Dept: [phone] and follow the prompts. ? ? ?For any non-urgent questions, you may also contact your provider using MyChart. We now offer e-Visits for anyone 68 and older to request care online for non-urgent symptoms. For details visit [email or web address]. ?  ?Also download the MyChart app! Go to the app store, search "MyChart", open the app, select [facility], and log in with your MyChart username and password. ? ?Due to Covid, a mask is required upon entering the hospital/clinic. If you do not have a mask, one will be given to you upon arrival. For doctor visits, patients may have 1 support person aged 65 or older with them. For treatment visits, patients cannot have anyone with them due to current Covid guidelines and our immunocompromised population.  ? ?Docetaxel injection ?What is this medication? ?DOCETAXEL (doe se TAX el) is a chemotherapy drug. It targets fast dividing cells, like cancer cells, and causes these cells to die. This medicine is used to treat many types of cancers like breast cancer, certain stomach cancers, head and  neck cancer, lung cancer, and prostate cancer. ?This medicine may be used for other purposes; ask your health care provider or pharmacist if you have questions. ?COMMON BRAND NAME(S): Docefrez, Taxotere ?What should I tell my care team before I take this medication? ?They need to know if you have any of these  conditions: ?infection (especially a virus infection such as chickenpox, cold sores, or herpes) ?liver disease ?low blood counts, like low white cell, platelet, or red cell counts ?an unusual or allergic reaction to docetaxel, polysorbate 80, other chemotherapy agents, other medicines, foods, dyes, or preservatives ?pregnant or trying to get pregnant ?breast-feeding ?How should I use this medication? ?This drug is given as an infusion into a vein. It is administered in a hospital or clinic by a specially trained health care professional. ?Talk to your pediatrician regarding the use of this medicine in children. Special care may be needed. ?Overdosage: If you think you have taken too much of this medicine contact a poison control center or emergency room at once. ?NOTE: This medicine is only for you. Do not share this medicine with others. ?What if I miss a dose? ?It is important not to miss your dose. Call your doctor or health care professional if you are unable to keep an appointment. ?What may interact with this medication? ?Do not take this medicine with any of the following medications: ?live virus vaccines ?This medicine may also interact with the following medications: ?aprepitant ?certain antibiotics like erythromycin or clarithromycin ?certain antivirals for HIV or hepatitis ?certain medicines for fungal infections like fluconazole, itraconazole, ketoconazole, posaconazole, or voriconazole ?cimetidine ?ciprofloxacin ?conivaptan ?cyclosporine ?dronedarone ?fluvoxamine ?grapefruit juice ?imatinib ?verapamil ?This list may not describe all possible interactions. Give your health care provider a list of all the medicines, herbs, non-prescription drugs, or dietary supplements you use. Also tell them if you smoke, drink alcohol, or use illegal drugs. Some items may interact with your medicine. ?What should I watch for while using this medication? ?Your condition will be monitored carefully while you are receiving  this medicine. You will need important blood work done while you are taking this medicine. ?Call your doctor or health care professional for advice if you get a fever, chills or sore throat, or other symptoms of a cold or flu. Do not treat yourself. This drug decreases your body's ability to fight infections. Try to avoid being around people who are sick. ?Some products may contain alcohol. Ask your health care professional if this medicine contains alcohol. Be sure to tell all health care professionals you are taking this medicine. Certain medicines, like metronidazole and disulfiram, can cause an unpleasant reaction when taken with alcohol. The reaction includes flushing, headache, nausea, vomiting, sweating, and increased thirst. The reaction can last from 30 minutes to several hours. ?You may get drowsy or dizzy. Do not drive, use machinery, or do anything that needs mental alertness until you know how this medicine affects you. Do not stand or sit up quickly, especially if you are an older patient. This reduces the risk of dizzy or fainting spells. Alcohol may interfere with the effect of this medicine. ?Talk to your health care professional about your risk of cancer. You may be more at risk for certain types of cancer if you take this medicine. ?Do not become pregnant while taking this medicine or for 6 months after stopping it. Women should inform their doctor if they wish to become pregnant or think they might be pregnant. There is a potential for serious side  effects to an unborn child. Talk to your health care professional or pharmacist for more information. Do not breast-feed an infant while taking this medicine or for 1 week after stopping it. ?Males who get this medicine must use a condom during sex with females who can get pregnant. If you get a woman pregnant, the baby could have birth defects. The baby could die before they are born. You will need to continue wearing a condom for 3 months after  stopping the medicine. Tell your health care provider right away if your partner becomes pregnant while you are taking this medicine. ?This may interfere with the ability to father a child. You should talk to your doctor

## 2021-07-12 NOTE — Progress Notes (Signed)
Patient seen by Lisa Thomas NP today  Vitals are within treatment parameters.  Labs reviewed by Lisa Thomas NP and are within treatment parameters.  Per physician team, patient is ready for treatment and there are NO modifications to the treatment plan.     

## 2021-07-12 NOTE — Progress Notes (Signed)
?[facility] ?OFFICE PROGRESS NOTE ? ? ?Diagnosis: Prostate cancer, hepatocellular carcinoma ? ?INTERVAL HISTORY:  ? ?Edgar Mooney returns as scheduled.  He completed cycle 2 Taxotere 06/21/2021.  He completed Y90 to the right lobe of the liver 06/26/2021.  He denies nausea/vomiting.  No mouth sores.  No diarrhea.  No numbness or tingling in the hands or feet.  No rash after chemotherapy.  No tearing.  Left hip pain is better.  He had to take pain medication more frequently after the Y90 procedure.  He would like a refill.  He reports a "gout flare" behind the right knee. ? ?Objective: ? ?Vital signs in last 24 hours: ? ?Blood pressure (!) 142/86, pulse 70, temperature 97.8 ?F (36.6 ?C), temperature source Oral, resp. rate 18, height _0  (1.575 m), weight 153 lb 9.6 oz (69.7 kg), SpO2 100 %. ?  ? ?HEENT: No thrush or ulcers. ?Resp: Lungs clear bilaterally. ?Cardio: Regular rate and rhythm. ?GI: Abdomen soft and nontender.  No hepatomegaly. ?Vascular: No leg edema. ?Musculoskeletal: Right knee is without erythema and without edema. ?Skin: No rash. ? ? ?Lab Results: ? ?Lab Results  ?Component Value Date  ? WBC 6.7 07/12/2021  ? HGB 12.4 (L) 07/12/2021  ? HCT 38.0 (L) 07/12/2021  ? MCV 92.0 07/12/2021  ? PLT 123 (L) 07/12/2021  ? NEUTROABS 5.7 07/12/2021  ? ? ?Imaging: ? ?No results found. ? ?Medications: I have reviewed the patient's current medications. ? ?Assessment/Plan: ?Metastatic prostate cancer ?- Lytic bone lesions with retroperitoneal adenopathy concerning for metastatic prostate cancer ?-03/03/2020-CTA chest/abdomen/pelvis widespread osseous metastatic disease and lower retroperitoneal adenopathy, pathologic right anterior third rib fracture, probable spinal canal tumor at the level of S1, ?-MRI cervical, thoracic, and lumbar spine 03/03/2020-diffuse osseous metastatic disease, ventral epidural tumor impinging on right S1 nerve root, extraosseous tumor at the bilateral ilium, asymmetric  enhancing material at the right C5-6 canal-right  ?-03/03/2020-PSA 763 ?-Degarelix 03/04/2020 ?-Abiraterone/prednisone 03/14/2020 ?-Every 64-monthLupron 04/01/2020 ?-04/01/2020 PSA 36.5 ?-05/31/2020 PSA 1.2 ?-06/28/2020 PSA 1.0 ?-12/14/2020 PSA 6.4 ?-01/13/2021 PSA 10.6 ?-04/26/2021 PSA  45.2 ?-05/16/2021 PSA 70 ?-Abiraterone/prednisone discontinued 05/16/2021 ?-05/16/2021 left hip x-ray-multifocal sclerotic metastasis throughout the pelvis, confluent involving the left acetabulum.  No acute or pathologic fracture.  Left hip osteoarthritis. ?-Cycle 1 docetaxel 05/31/2021 ?-Palliative radiation to the left acetabulum 06/08/2021 - 06/20/2021 ?-Cycle 2 docetaxel 06/21/2021 ?-Cycle 3 docetaxel 07/12/2021 ?  ?2.  Severe anemia-likely secondary to metastatic prostate cancer involving the bones ?3.  Mild thrombocytopenia ?4.  Cirrhosis with splenomegaly ?5.  History of hepatitis C ?6.  History of polysubstance abuse ?7.  Depression ?8.  Tobacco dependence ?9.  Right arm weakness, right facial numbness-potentially related to nerve root compromise from metastatic bone lesions ?10.  Pain secondary to #1 ?11.  Extensive bone metastases-every 316-monthometa starting 04/01/2020 ?12.  Gout-acute flare right wrist 04/01/2020 treated with indomethacin, allopurinol resumed ?13.  Right hepatic lobe mass with elevated AFP concerning for HCC ?MRI abdomen 12/21/2020-hypoenhancing inferior right liver mass, segment 6, occluding or compressing the adjacent right portal vein branch, intrahepatic cholangiocarcinoma favored with differential including hepatocellular carcinoma, Li-Rads M ?Y 90 planning study 11-22 ?Y90 right lobe of liver 06/26/2021 ?14.  Admission 12/19/2020 with an NSTEMI ?Catheterization 12/21/2020-severe multivessel CAD, 99% proximal LAD stenosis, LAD stent placed ?15. 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  ? ?Disposition: Edgar Mooney stable.  He has completed 2 cycles of docetaxel.  He is  tolerating well.  Plan to  proceed with cycle 3 today as scheduled. ? ?CBC and chemistry panel reviewed.  Labs adequate to proceed as above. ? ?He completed Y90 to the right lobe of the liver 06/26/2021. ? ?Refill on pain medication sent to his pharmacy. ? ?He will return for lab, follow-up, docetaxel in 3 weeks.  We are available to see him sooner if needed. ? ? ? ?Ned Card ANP/GNP-BC  ? ?07/12/2021  ?10:18 AM ? ? ? ? ? ? ? ?

## 2021-07-12 NOTE — Progress Notes (Signed)
Patient presents for treatment. RN assessment completed along with the following: ? ?Labs/vitals reviewed - Yes, and within treatment parameters.   ?Weight within 10% of previous measurement - Yes ?Informed consent completed and reflects current therapy/intent - Yes, on date 05/31/2021             ?Provider progress note reviewed - Today's provider note is not yet available. I reviewed the most recent oncology provider progress note in chart dated 06/21/2021. ?Treatment/Antibody/Supportive plan reviewed - Yes, and there are no adjustments needed for today's treatment. ?S&H and other orders reviewed - Yes, and there are no additional orders identified. ?Previous treatment date reviewed - Yes, and the appropriate amount of time has elapsed between treatments. ?Clinic Hand Off Received from - Yes from [place], RN ? ?Patient to proceed with treatment.  ? ?

## 2021-07-13 ENCOUNTER — Encounter: Payer: Self-pay | Admitting: Oncology

## 2021-07-13 ENCOUNTER — Telehealth: Payer: Self-pay | Admitting: *Deleted

## 2021-07-13 LAB — PROSTATE-SPECIFIC AG, SERUM (LABCORP): Prostate Specific Ag, Serum: 52 ng/mL — ABNORMAL HIGH (ref 0.0–4.0)

## 2021-07-13 NOTE — Telephone Encounter (Signed)
CALLED PATIENT TO ASK QUESTION, LVM FOR A RETURN CALL 

## 2021-07-14 ENCOUNTER — Inpatient Hospital Stay: Payer: Medicaid Other

## 2021-07-14 VITALS — BP 141/76 | HR 85 | Temp 98.7°F | Resp 20

## 2021-07-14 DIAGNOSIS — Z5111 Encounter for antineoplastic chemotherapy: Secondary | ICD-10-CM | POA: Diagnosis not present

## 2021-07-14 DIAGNOSIS — C61 Malignant neoplasm of prostate: Secondary | ICD-10-CM

## 2021-07-14 MED ORDER — PEGFILGRASTIM-BMEZ 6 MG/0.6ML ~~LOC~~ SOSY
6.0000 mg | PREFILLED_SYRINGE | Freq: Once | SUBCUTANEOUS | Status: AC
  Administered 2021-07-14: 6 mg via SUBCUTANEOUS
  Filled 2021-07-14: qty 0.6

## 2021-07-14 NOTE — Patient Instructions (Signed)

## 2021-07-20 ENCOUNTER — Ambulatory Visit: Payer: Self-pay | Admitting: Radiation Oncology

## 2021-07-21 ENCOUNTER — Encounter: Payer: Self-pay | Admitting: Radiology

## 2021-07-26 NOTE — Progress Notes (Signed)
?Radiation Oncology         [phone] ?________________________________ ? ?Name: Edgar Mooney MRN: [id number]  ?Date: 07/27/2021  DOB: [birth year] ? ?Follow-Up Visit Note ? ?CC: Patient, No Pcp Per (Inactive)  Benay Spice Izola Price, MD ? ?  ICD-10-CM   ?1. Prostate cancer ([place])  C61   ?  ? ? ?Diagnosis:  The encounter diagnosis was Prostate cancer ([place]). ?  ?Metastatic prostate cancer; widespread osseous metastatic disease involving the lumbar spine and pelvis ? ? Cancer Staging  ?Prostate cancer ([place]) ?Staging form: Prostate, AJCC 8th Edition ?- Clinical: Stage IVB (cTX, cM1b) - Signed by Ladell Pier, MD on 05/23/2021 ? ?Interval Since Last Radiation:  1 month and 6 days  ? ?Intent: Palliative ? ?Radiation Treatment Dates: 06/08/2021 through 06/20/2021 ?Site Technique Total Dose (Gy) Dose per Fx (Gy) Completed Fx Beam Energies  ?Hip, Left: Pelvis_L IMRT 28/28 3.5 8/8 15X  ? ? ?Narrative:  The patient returns today for routine follow-up. The patient tolerated radiation therapy relatively well. On the date of his final treatment, the patient reported resolution of left hip pain, intermittent left shoulder pain, and mild fatigue. He otherwise denied any side effects from treatment.    ? ?Since completing treatment, the patient followed up with Dr. Benay Spice on 06/21/21. During which time, the patient was noted to tolerate cycle 1 of docetaxel well (05/31/21). He also tolerated G-CSF well on 06/02/2021. He denied any symptoms whatsoever following both. The patient again reported significant improvement of his left sided hip pain from RT. He did however endorse "soreness "in the suprapubic area for the several days before this visit,           ? ?The patient also completed Y90 treatment to the right lobe of the liver 06/26/2021 (hepatocellular carcinoma).  ? ?He received his most recent cycle of chemotherapy on 07/12/21 (cycle 3). During his follow up visit with Dr. Gearldine Shown office on that date, the patient reported  requiring more pain mediation after his Y90 treatment, but otherwise denied any side effects from systemic treatment. ? ?Patient reports his left hip pain is resolved at this time.  I occasionally will develop what he describes as a "catch" in his left hip which is little uncomfortable.  He denies any swelling in his left lower extremity or weakness in his left leg. ? ?Allergies:  has No Known Allergies. ? ?Meds: ?Current Outpatient Medications  ?Medication Sig Dispense Refill  ? allopurinol (ZYLOPRIM) 100 MG tablet TAKE 1 TABLET BY MOUTH EVERY DAY 90 tablet 1  ? amLODipine (NORVASC) 5 MG tablet Take 1 tablet (5 mg total) by mouth daily. 90 tablet 3  ? aspirin 81 MG chewable tablet Chew 1 tablet (81 mg total) by mouth daily. 90 tablet 3  ? ferrous gluconate (FERGON) 324 MG tablet Take 1 tablet (324 mg total) by mouth 2 (two) times daily with a meal. 30 tablet 3  ? indomethacin (INDOCIN) 50 MG capsule Take 1 capsule (50 mg total) by mouth 3 (three) times daily with meals. For gout flare 15 capsule 0  ? oxyCODONE (OXY IR/ROXICODONE) 5 MG immediate release tablet Take 1 tablet (5 mg total) by mouth every 6 (six) hours as needed for severe pain. 60 tablet 0  ? predniSONE (DELTASONE) 5 MG tablet Take 1 tablet (5 mg total) by mouth 2 (two) times daily with a meal. 60 tablet 2  ? rosuvastatin (CRESTOR) 20 MG tablet Take 1 tablet (20 mg total) by mouth daily. 90 tablet  0  ? tamsulosin (FLOMAX) 0.4 MG CAPS capsule Take 1 capsule (0.4 mg total) by mouth daily. 90 capsule 0  ? ticagrelor (BRILINTA) 90 MG TABS tablet Take 1 tablet (90 mg total) by mouth 2 (two) times daily. 120 tablet 3  ? nitroGLYCERIN (NITROSTAT) 0.4 MG SL tablet Place 1 tablet (0.4 mg total) under the tongue every 5 (five) minutes as needed for chest pain. (Patient not taking: Reported on 06/21/2021) 25 tablet 12  ? ondansetron (ZOFRAN) 8 MG tablet Take 1 tablet (8 mg total) by mouth every 8 (eight) hours as needed for nausea or vomiting. (Patient not taking:  Reported on 06/21/2021) 20 tablet 1  ? prochlorperazine (COMPAZINE) 10 MG tablet Take 1 tablet (10 mg total) by mouth every 6 (six) hours as needed for nausea or vomiting. (Patient not taking: Reported on 06/21/2021) 30 tablet 0  ? ?No current facility-administered medications for this encounter.  ? ? ?Physical Findings: ?The patient is in no acute distress. Patient is alert and oriented. ? height is '5\' 2"'$  (1.575 m) and weight is 157 lb 3.2 oz (71.3 kg). His temperature is 97.6 ?F (36.4 ?C). His blood pressure is 153/82 (abnormal) and his pulse is 69. His respiration is 20 and oxygen saturation is 100%. .  No significant changes. Lungs are clear to auscultation bilaterally. Heart has regular rate and rhythm. No palpable cervical, supraclavicular, or axillary adenopathy. Abdomen soft, non-tender, normal bowel sounds.  Good strength in this lower extremities,  proximal and distal bilaterally. ? ? ?Lab Findings: ?Lab Results  ?Component Value Date  ? WBC 6.7 07/12/2021  ? HGB 12.4 (L) 07/12/2021  ? HCT 38.0 (L) 07/12/2021  ? MCV 92.0 07/12/2021  ? PLT 123 (L) 07/12/2021  ? ? ?Radiographic Findings: ?No results found. ? ?Impression: Metastatic prostate cancer; widespread osseous metastatic disease involving the lumbar spine and pelvis ? ?He received significant benefit from his palliative radiation therapy directed at the left pelvis. ? ?Plan: As needed follow-up in radiation oncology.  The patient will continue close follow-up with Dr. Benay Spice and continue on therapy for his prostate cancer and hepatocellular carcinoma. ? ? ? ?____________________________________ ? ?Blair Promise, PhD, MD ? ?This document serves as a record of services personally performed by Gery Pray, MD. It was created on his behalf by Roney Mans, a trained medical scribe. The creation of this record is based on the scribe's personal observations and the provider's statements to them. This document has been checked and approved by the attending  provider. ? ?

## 2021-07-26 NOTE — Progress Notes (Incomplete)
?  Radiation Oncology         [phone] ?________________________________ ? ?Patient Name: Edgar Mooney ?MRN: [id number] ?DOB: [birth year] ?Referring Physician: Betsy Coder (Profile Not Attached) ?Date of Service: 06/20/2021 ?[facility], [place] ? ?                                                      End Of Treatment Note ? ?Diagnoses: C61-Malignant neoplasm of prostate ? ?Cancer Staging: Metastatic prostate cancer; widespread osseous metastatic disease involving the lumbar spine and pelvis ? ? Cancer Staging  ?Prostate cancer ([place]) ?Staging form: Prostate, AJCC 8th Edition ?- Clinical: Stage IVB (cTX, cM1b) - Signed by Ladell Pier, MD on 05/23/2021 ? ?Intent: Palliative ? ?Radiation Treatment Dates: 06/08/2021 through 06/20/2021 ?Site Technique Total Dose (Gy) Dose per Fx (Gy) Completed Fx Beam Energies  ?Hip, Left: Pelvis_L IMRT 28/28 3.5 8/8 15X  ? ?Narrative: The patient tolerated radiation therapy relatively well. On the date of his final treatment, the patient reported resolution of left hip pain, intermittent left shoulder pain, and mild fatigue. He otherwise denied any side effects from treatment.  ? ?Plan: The patient will follow-up with radiation oncology in one month . ? ?________________________________________________ ?----------------------------------- ? ?Blair Promise, PhD, MD ? ?This document serves as a record of services personally performed by Gery Pray, MD. It was created on his behalf by Roney Mans, a trained medical scribe. The creation of this record is based on the scribe's personal observations and the provider's statements to them. This document has been checked and approved by the attending provider. ? ?

## 2021-07-27 ENCOUNTER — Other Ambulatory Visit: Payer: Self-pay

## 2021-07-27 ENCOUNTER — Other Ambulatory Visit: Payer: Self-pay | Admitting: Oncology

## 2021-07-27 ENCOUNTER — Encounter: Payer: Self-pay | Admitting: Radiation Oncology

## 2021-07-27 ENCOUNTER — Encounter: Payer: Self-pay | Admitting: Oncology

## 2021-07-27 ENCOUNTER — Ambulatory Visit
Admission: RE | Admit: 2021-07-27 | Discharge: 2021-07-27 | Disposition: A | Discharge status: Home / Self Care | Payer: Medicare Other | Source: Ambulatory Visit | Attending: Radiation Oncology | Admitting: Radiation Oncology

## 2021-07-27 ENCOUNTER — Ambulatory Visit
Admission: RE | Admit: 2021-07-27 | Discharge: 2021-07-27 | Disposition: A | Discharge status: Home / Self Care | Payer: BC Managed Care – PPO | Source: Ambulatory Visit | Attending: Internal Medicine | Admitting: Internal Medicine

## 2021-07-27 VITALS — BP 153/82 | HR 69 | Temp 97.6°F | Resp 20 | Ht 62.0 in | Wt 157.2 lb

## 2021-07-27 DIAGNOSIS — C7951 Secondary malignant neoplasm of bone: Secondary | ICD-10-CM | POA: Insufficient documentation

## 2021-07-27 DIAGNOSIS — C61 Malignant neoplasm of prostate: Secondary | ICD-10-CM | POA: Diagnosis not present

## 2021-07-27 DIAGNOSIS — Z923 Personal history of irradiation: Secondary | ICD-10-CM | POA: Insufficient documentation

## 2021-07-27 DIAGNOSIS — Z8505 Personal history of malignant neoplasm of liver: Secondary | ICD-10-CM | POA: Diagnosis not present

## 2021-07-27 DIAGNOSIS — C220 Liver cell carcinoma: Secondary | ICD-10-CM

## 2021-07-27 DIAGNOSIS — Z7982 Long term (current) use of aspirin: Secondary | ICD-10-CM | POA: Insufficient documentation

## 2021-07-27 DIAGNOSIS — Z79899 Other long term (current) drug therapy: Secondary | ICD-10-CM | POA: Diagnosis not present

## 2021-07-27 HISTORY — PX: IR RADIOLOGIST EVAL & MGMT: IMG5224

## 2021-07-27 NOTE — Progress Notes (Signed)
? ?Referring Physician(s): ?Sherrill,Gary B ? ?Reason for follow up:  Initial follow up after Y90 radioembolization ? ?History of present illness: ?Edgar Mooney is a 56 y.o. male with history of alcohol abuse, hepatitis C (treated 3-4 years ago), coronary artery disease, and prostate cancer who presented in August 2022 with right sided abdominal pain. CT was obtained which demonstrated a large right hepatic mass, new since November 2021.  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  Serum a-FP is elevated at >47,000.   ? ?He is now status post right posterior hepatic radiation segmentectomy on 06/26/21.  He is feeling well since the procedure with no symptoms.  No pain, nausea, vomiting.  No scleral icterus or jaundice.   ? ?Past Medical History:  ?Diagnosis Date  ? Alcohol abuse   ? Cocaine abuse ([place])   ? Depression   ? Gout   ? Hep C w/o coma, chronic [facility]) diagnosed May 2016  ? History of radiation therapy   ? left hip 06/08/2021-06/20/2021  Dr Gery Pray  ? 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 ? ?Past Surgical History:  ?Procedure Laterality Date  ? CORONARY STENT INTERVENTION N/A 12/21/2020  ? Procedure: CORONARY STENT INTERVENTION;  Surgeon: Troy Sine, MD;  Location: [facility];  Service: Cardiovascular;  Laterality: N/A;  ? CORONARY STENT INTERVENTION N/A 12/22/2020  ? Procedure: CORONARY STENT INTERVENTION;  Surgeon: Burnell Blanks, MD;  Location: [facility];  Service: Cardiovascular;  Laterality: N/A;  ? IR 3D INDEPENDENT WKST  02/15/2021  ? IR ANGIOGRAM SELECTIVE EACH ADDITIONAL VESSEL  02/15/2021  ? IR ANGIOGRAM SELECTIVE EACH ADDITIONAL VESSEL  02/15/2021  ? IR ANGIOGRAM SELECTIVE EACH ADDITIONAL VESSEL  06/26/2021  ? IR ANGIOGRAM SELECTIVE EACH ADDITIONAL VESSEL  06/28/2021  ? IR ANGIOGRAM VISCERAL SELECTIVE  02/15/2021  ? IR ANGIOGRAM VISCERAL SELECTIVE  02/15/2021  ? IR ANGIOGRAM VISCERAL SELECTIVE  06/26/2021  ? IR EMBO  TUMOR ORGAN ISCHEMIA INFARCT INC GUIDE ROADMAPPING  06/26/2021  ? IR RADIOLOGIST EVAL & MGMT  01/11/2021  ? IR US GUIDE VASC ACCESS RIGHT  02/15/2021  ? IR US GUIDE VASC ACCESS RIGHT  06/26/2021  ? LEFT HEART CATH AND CORONARY ANGIOGRAPHY N/A 12/21/2020  ? Procedure: LEFT HEART CATH AND CORONARY ANGIOGRAPHY;  Surgeon: Troy Sine, MD;  Location: [facility];  Service: Cardiovascular;  Laterality: N/A;  ? ? ?Allergies: ?Patient has no known allergies. ? ?Medications: ?Prior to Admission medications   ?Medication Sig Start Date End Date Taking? Authorizing Provider  ?allopurinol (ZYLOPRIM) 100 MG tablet TAKE 1 TABLET BY MOUTH EVERY DAY 07/12/21   Owens Shark, NP  ?amLODipine (NORVASC) 5 MG tablet Take 1 tablet (5 mg total) by mouth daily. 12/24/20   Charlynne Cousins, MD  ?aspirin 81 MG chewable tablet Chew 1 tablet (81 mg total) by mouth daily. 12/24/20   Charlynne Cousins, MD  ?ferrous gluconate (FERGON) 324 MG tablet Take 1 tablet (324 mg total) by mouth 2 (two) times daily with a meal. 03/07/20   Guilford Shi, MD  ?indomethacin (INDOCIN) 50 MG capsule Take 1 capsule (50 mg total) by mouth 3 (three) times daily with meals. For gout flare 04/01/20   Owens Shark, NP  ?nitroGLYCERIN (NITROSTAT) 0.4 MG SL tablet Place 1 tablet (0.4 mg total) under the tongue every 5 (five) minutes as needed for chest pain. ?Patient not taking: Reported on 06/21/2021 12/23/20  Charlynne Cousins, MD  ?ondansetron (ZOFRAN) 8 MG tablet Take 1 tablet (8 mg total) by mouth every 8 (eight) hours as needed for nausea or vomiting. ?Patient not taking: Reported on 06/21/2021 05/31/21   Ladell Pier, MD  ?oxyCODONE (OXY IR/ROXICODONE) 5 MG immediate release tablet Take 1 tablet (5 mg total) by mouth every 6 (six) hours as needed for severe pain. 07/12/21   Owens Shark, NP  ?predniSONE (DELTASONE) 5 MG tablet Take 1 tablet (5 mg total) by mouth 2 (two) times daily with a meal. 05/31/21   Owens Shark, NP  ?prochlorperazine  (COMPAZINE) 10 MG tablet Take 1 tablet (10 mg total) by mouth every 6 (six) hours as needed for nausea or vomiting. ?Patient not taking: Reported on 06/21/2021 05/31/21   Ladell Pier, MD  ?rosuvastatin (CRESTOR) 20 MG tablet Take 1 tablet (20 mg total) by mouth daily. 12/24/20   Charlynne Cousins, MD  ?tamsulosin (FLOMAX) 0.4 MG CAPS capsule Take 1 capsule (0.4 mg total) by mouth daily. 05/11/21   Ladell Pier, MD  ?ticagrelor (BRILINTA) 90 MG TABS tablet Take 1 tablet (90 mg total) by mouth 2 (two) times daily. 12/23/20   Charlynne Cousins, MD  ?  ? ?Family History  ?Problem Relation Age of Onset  ? Cancer Mother   ? Cancer Father   ? ? ?Social History  ? ?Socioeconomic History  ? Marital status: Single  ?  Spouse name: Not on file  ? Number of children: Not on file  ? Years of education: Not on file  ? Highest education level: Not on file  ?Occupational History  ? Not on file  ?Tobacco Use  ? Smoking status: Every Day  ?  Packs/day: 1.00  ?  Years: 25.00  ?  Pack years: 25.00  ?  Types: Cigarettes  ? Smokeless tobacco: Never  ?Substance and Sexual Activity  ? Alcohol use: Not Currently  ?  Alcohol/week: 126.0 standard drinks  ?  Types: 126 Cans of beer per week  ?  Comment: 18 pack per day  ? Drug use: Yes  ?  Types: Cocaine, IV, Heroin, Marijuana  ?  Comment: Used crack and cocaine 04/02/16  ? Sexual activity: Not Currently  ?Other Topics Concern  ? Not on file  ?Social History Narrative  ? Not on file  ? ?Social Determinants of Health  ? ?Financial Resource Strain: Not on file  ?Food Insecurity: Not on file  ?Transportation Needs: Not on file  ?Physical Activity: Not on file  ?Stress: Not on file  ?Social Connections: Not on file  ? ? ? ?Vital Signs: ?There were no vitals taken for this visit. ? ?No physical examination was performed in lieu of virtual telephone clinic visit. ? ?Imaging: ?02/15/21 ? ? ?Labs: ? ?CBC: ?Recent Labs  ?  06/21/21 ?0928 06/26/21 ?0803 07/04/21 ?0951 07/12/21 ?0936  ?WBC 7.3  18.2* 15.7* 6.7  ?HGB 12.2* 12.0* 11.4* 12.4*  ?HCT 37.2* 36.1* 33.9* 38.0*  ?PLT 122* 117* 110* 123*  ? ? ?COAGS: ?Recent Labs  ?  12/14/20 ?1003 02/15/21 ?0812 06/26/21 ?0803  ?INR 0.9 0.9 1.0  ? ? ?BMP: ?Recent Labs  ?  05/31/21 ?1694 06/21/21 ?5038 06/26/21 ?8828 07/12/21 ?0034  ?NA 134* 139 139 138  ?K 4.1 3.7 3.5 4.1  ?CL 103 109 109 108  ?CO2 20* 19* 22 19*  ?GLUCOSE 99 128* 91 131*  ?BUN _0 ?CALCIUM 9.1 9.1 8.7* 9.3  ?  CREATININE 0.55* 0.57* 0.49* 0.66  ?GFRNONAA >60 >60 >60 >60  ? ? ?LIVER FUNCTION TESTS: ?Recent Labs  ?  05/31/21 ?6553 06/21/21 ?7482 06/26/21 ?7078 07/12/21 ?6754  ?BILITOT 0.9 0.6 1.0 0.8  ?AST _0 ?ALT _1 35  ?ALKPHOS 104 103 112 142*  ?PROT 7.7 7.2 7.3 7.3  ?ALBUMIN 4.3 4.3 3.9 4.4  ? ? ?Assessment and Plan: ?56 year old male without established history of cirrhosis though does have a history of treated hepatitis C and alcoholism found to have a large (10 cm) right segment V/VI hepatoma with associated elevated a-FP (>47,000) compatible with hepatocellular carcinoma. Now status post right posterior hepatic radiation segmentectomy on 06/26/21.  Recovering very well post procedure. ? ?Plan for follow up MR abdomen in 2 months (mid June) with CBC, CMP, INR, AFP, with clinic visit to follow. ? ? ? ?Electronically Signed: ?Jenese Mischke J Layney Gillson ?07/27/2021, 1:43 PM ? ? ?I spent a total of 25 Minutes in virtual telephone clinical consultation, greater than 50% of which was counseling/coordinating care for hepatocellular carcinoma. ? ? ? ? ? ?

## 2021-07-27 NOTE — Progress Notes (Signed)
[name] is here today for follow up post radiation to prostate with mets to left hip ? ?They completed their radiation on: 06/20/21 ? ?Does the patient complain of any of the following: ? ?Pain: No, patient denies pain, Patient reports left leg feels like it is going to lock up while ambulating.  ?Diarrhea/Constipation: No ?Nausea/Vomiting: No ?Blood in Urine or Stool: NO ?Urinary Issues (dysuria/incomplete emptying/ incontinence/ increased frequency/urgency): No, patient continues to take Flomax.  ?Post radiation skin changes: No skin remains intact.  ? ? ?Additional comments if applicable: ? Patient reports having an outpatient procedure  06/26/21 -right hepatic transarterial radioembolization segmentectomy, due to having a new spot on his liver.  ? ?Vitals:  ? 07/27/21 1037  ?BP: (!) 153/82  ?Pulse: 69  ?Resp: 20  ?Temp: 97.6 ?F (36.4 ?C)  ?SpO2: 100%  ?Weight: 157 lb 3.2 oz (71.3 kg)  ?Height: '5\' 2"'$  (1.575 m)  ?  ?

## 2021-07-31 ENCOUNTER — Other Ambulatory Visit: Payer: Self-pay | Admitting: Nurse Practitioner

## 2021-07-31 ENCOUNTER — Telehealth: Payer: Self-pay

## 2021-07-31 DIAGNOSIS — C61 Malignant neoplasm of prostate: Secondary | ICD-10-CM

## 2021-07-31 MED ORDER — OXYCODONE HCL 5 MG PO TABS: 5.0000 mg | ORAL_TABLET | Freq: Four times a day (QID) | ORAL | 0 refills | Status: DC | PRN

## 2021-07-31 NOTE — Telephone Encounter (Signed)
Patient called in a request of Oxycodone the request was place on the NP's desk. ? ?  ?

## 2021-08-02 ENCOUNTER — Encounter: Payer: Self-pay | Admitting: Nurse Practitioner

## 2021-08-02 ENCOUNTER — Inpatient Hospital Stay (HOSPITAL_BASED_OUTPATIENT_CLINIC_OR_DEPARTMENT_OTHER): Payer: Medicaid Other | Admitting: Nurse Practitioner

## 2021-08-02 ENCOUNTER — Other Ambulatory Visit (HOSPITAL_COMMUNITY): Payer: Self-pay

## 2021-08-02 ENCOUNTER — Other Ambulatory Visit: Payer: Self-pay | Admitting: Cardiovascular Disease

## 2021-08-02 ENCOUNTER — Telehealth: Payer: Self-pay | Admitting: Cardiovascular Disease

## 2021-08-02 ENCOUNTER — Encounter: Payer: Self-pay | Admitting: *Deleted

## 2021-08-02 ENCOUNTER — Telehealth: Payer: Self-pay

## 2021-08-02 ENCOUNTER — Inpatient Hospital Stay: Payer: Medicaid Other | Attending: Nurse Practitioner

## 2021-08-02 ENCOUNTER — Inpatient Hospital Stay: Payer: Medicaid Other

## 2021-08-02 VITALS — BP 152/81 | HR 74 | Temp 97.8°F | Resp 18 | Ht 62.0 in | Wt 158.4 lb

## 2021-08-02 DIAGNOSIS — C61 Malignant neoplasm of prostate: Secondary | ICD-10-CM | POA: Diagnosis present

## 2021-08-02 DIAGNOSIS — I209 Angina pectoris, unspecified: Secondary | ICD-10-CM

## 2021-08-02 DIAGNOSIS — D696 Thrombocytopenia, unspecified: Secondary | ICD-10-CM | POA: Diagnosis not present

## 2021-08-02 DIAGNOSIS — D649 Anemia, unspecified: Secondary | ICD-10-CM | POA: Diagnosis not present

## 2021-08-02 DIAGNOSIS — C221 Intrahepatic bile duct carcinoma: Secondary | ICD-10-CM | POA: Diagnosis not present

## 2021-08-02 DIAGNOSIS — Z5189 Encounter for other specified aftercare: Secondary | ICD-10-CM | POA: Insufficient documentation

## 2021-08-02 DIAGNOSIS — Z5111 Encounter for antineoplastic chemotherapy: Secondary | ICD-10-CM | POA: Insufficient documentation

## 2021-08-02 DIAGNOSIS — C7951 Secondary malignant neoplasm of bone: Secondary | ICD-10-CM | POA: Insufficient documentation

## 2021-08-02 LAB — CMP (CANCER CENTER ONLY)
ALT: 19 U/L (ref 0–44)
AST: 22 U/L (ref 15–41)
Albumin: 4.5 g/dL (ref 3.5–5.0)
Alkaline Phosphatase: 131 U/L — ABNORMAL HIGH (ref 38–126)
Anion gap: 9 (ref 5–15)
BUN: 10 mg/dL (ref 6–20)
CO2: 21 mmol/L — ABNORMAL LOW (ref 22–32)
Calcium: 9.9 mg/dL (ref 8.9–10.3)
Chloride: 105 mmol/L (ref 98–111)
Creatinine: 0.62 mg/dL (ref 0.61–1.24)
GFR, Estimated: >60 mL/min (ref >60)
Glucose, Bld: 99 mg/dL (ref 70–99)
Potassium: 3.7 mmol/L (ref 3.5–5.1)
Sodium: 135 mmol/L (ref 135–145)
Total Bilirubin: 1.4 mg/dL — ABNORMAL HIGH (ref 0.3–1.2)
Total Protein: 7.3 g/dL (ref 6.5–8.1)

## 2021-08-02 LAB — CBC WITH DIFFERENTIAL (CANCER CENTER ONLY)
Abs Immature Granulocytes: 0.05 10*3/uL (ref 0.00–0.07)
Basophils Absolute: 0.1 10*3/uL (ref 0.0–0.1)
Basophils Relative: 1 %
Eosinophils Absolute: 0 10*3/uL (ref 0.0–0.5)
Eosinophils Relative: 0 %
HCT: 32.6 % — ABNORMAL LOW (ref 39.0–52.0)
Hemoglobin: 11.4 g/dL — ABNORMAL LOW (ref 13.0–17.0)
Immature Granulocytes: 1 %
Lymphocytes Relative: 5 %
Lymphs Abs: 0.4 10*3/uL — ABNORMAL LOW (ref 0.7–4.0)
MCH: 31.7 pg (ref 26.0–34.0)
MCHC: 35 g/dL (ref 30.0–36.0)
MCV: 90.6 fL (ref 80.0–100.0)
Monocytes Absolute: 0.8 10*3/uL (ref 0.1–1.0)
Monocytes Relative: 10 %
Neutro Abs: 6.6 10*3/uL (ref 1.7–7.7)
Neutrophils Relative %: 83 %
Platelet Count: 124 10*3/uL — ABNORMAL LOW (ref 150–400)
RBC: 3.6 MIL/uL — ABNORMAL LOW (ref 4.22–5.81)
RDW: 16.6 % — ABNORMAL HIGH (ref 11.5–15.5)
WBC Count: 7.9 10*3/uL (ref 4.0–10.5)
nRBC: 0 % (ref 0.0–0.2)

## 2021-08-02 MED ORDER — SODIUM CHLORIDE 0.9% FLUSH: 3.0000 mL | Freq: Two times a day (BID) | INTRAVENOUS | Status: DC

## 2021-08-02 MED ORDER — ISOSORBIDE MONONITRATE ER 30 MG PO TB24
15.0000 mg | ORAL_TABLET | Freq: Every day | ORAL | 3 refills | Status: DC
  Filled 2021-08-02: qty 45, 90d supply, fill #0

## 2021-08-02 MED ORDER — ISOSORBIDE MONONITRATE ER 30 MG PO TB24: 15.0000 mg | ORAL_TABLET | Freq: Every day | ORAL | 3 refills | Status: DC

## 2021-08-02 MED ORDER — AMLODIPINE BESYLATE 5 MG PO TABS
5.0000 mg | ORAL_TABLET | Freq: Every day | ORAL | 3 refills | Status: DC
  Filled 2021-08-02: qty 90, 90d supply, fill #0

## 2021-08-02 MED ORDER — ROSUVASTATIN CALCIUM 20 MG PO TABS: 20.0000 mg | ORAL_TABLET | Freq: Every day | ORAL | 3 refills | Status: DC

## 2021-08-02 MED ORDER — SODIUM CHLORIDE 0.9 % IV SOLN
10.0000 mg | Freq: Once | INTRAVENOUS | Status: AC
  Administered 2021-08-02: 10 mg via INTRAVENOUS
  Filled 2021-08-02: qty 10

## 2021-08-02 MED ORDER — SODIUM CHLORIDE 0.9 % IV SOLN: Freq: Once | INTRAVENOUS | Status: AC

## 2021-08-02 MED ORDER — LEUPROLIDE ACETATE (3 MONTH) 22.5 MG ~~LOC~~ KIT
22.5000 mg | PACK | Freq: Once | SUBCUTANEOUS | Status: AC
  Administered 2021-08-02: 22.5 mg via SUBCUTANEOUS
  Filled 2021-08-02: qty 22.5

## 2021-08-02 MED ORDER — AMLODIPINE BESYLATE 5 MG PO TABS: 5.0000 mg | ORAL_TABLET | Freq: Every day | ORAL | 3 refills | Status: DC

## 2021-08-02 MED ORDER — ZOLEDRONIC ACID 4 MG/100ML IV SOLN
4.0000 mg | Freq: Once | INTRAVENOUS | Status: AC
  Administered 2021-08-02: 4 mg via INTRAVENOUS
  Filled 2021-08-02: qty 100

## 2021-08-02 MED ORDER — SODIUM CHLORIDE 0.9 % IV SOLN
60.0000 mg/m2 | Freq: Once | INTRAVENOUS | Status: AC
  Administered 2021-08-02: 100 mg via INTRAVENOUS
  Filled 2021-08-02: qty 10

## 2021-08-02 MED ORDER — ROSUVASTATIN CALCIUM 20 MG PO TABS
20.0000 mg | ORAL_TABLET | Freq: Every day | ORAL | 3 refills | Status: DC
  Filled 2021-08-02: qty 90, 90d supply, fill #0

## 2021-08-02 NOTE — Progress Notes (Signed)
?[facility] ?OFFICE PROGRESS NOTE ? ? ?Diagnosis: Prostate cancer, hepatocellular carcinoma ? ?INTERVAL HISTORY:  ? ?[name] returns as scheduled.  He completed cycle 3 docetaxel 07/12/2021.  He feels he is tolerating the chemotherapy well.  No nausea or vomiting.  No mouth sores.  He noted frequent formed stools for a few days after chemotherapy.  No frank diarrhea.  No numbness or tingling in the hands or feet.  He denies neuropathy symptoms.  He has intermittent pain at the left shoulder.  He no longer has left hip pain.  He takes about 4 oxycodone a day.  He reports recent recurrence of chest pain similar to that he was experiencing prior to stent placement. ? ?Objective: ? ?Vital signs in last 24 hours: ? ?Blood pressure (!) 152/81, pulse 74, temperature 97.8 ?F (36.6 ?C), temperature source Oral, resp. rate 18, height $RemoveBe'5\' 2"'DcMSIVxud$  (1.575 m), weight 158 lb 6.4 oz (71.8 kg), SpO2 100 %. ?  ? ?HEENT: No thrush or ulcers. ?Resp: Lungs clear bilaterally. ?Cardio: Regular rate and rhythm. ?GI: Abdomen soft and nontender.  No hepatomegaly. ?Vascular: No leg edema. ?Skin: No rash. ? ? ?Lab Results: ? ?Lab Results  ?Component Value Date  ? WBC 7.9 08/02/2021  ? HGB 11.4 (L) 08/02/2021  ? HCT 32.6 (L) 08/02/2021  ? MCV 90.6 08/02/2021  ? PLT 124 (L) 08/02/2021  ? NEUTROABS 6.6 08/02/2021  ? ? ?Imaging: ? ?No results found. ? ?Medications: I have reviewed the patient's current medications. ? ?Assessment/Plan: ?Metastatic prostate cancer ?- Lytic bone lesions with retroperitoneal adenopathy concerning for metastatic prostate cancer ?-03/03/2020-CTA chest/abdomen/pelvis widespread osseous metastatic disease and lower retroperitoneal adenopathy, pathologic right anterior third rib fracture, probable spinal canal tumor at the level of S1, ?-MRI cervical, thoracic, and lumbar spine 03/03/2020-diffuse osseous metastatic disease, ventral epidural tumor impinging on right S1 nerve root, extraosseous tumor at the  bilateral ilium, asymmetric enhancing material at the right C5-6 canal-right  ?-03/03/2020-PSA 763 ?-Degarelix 03/04/2020 ?-Abiraterone/prednisone 03/14/2020 ?-Every 87-month Lupron 04/01/2020 ?-04/01/2020 PSA 36.5 ?-05/31/2020 PSA 1.2 ?-06/28/2020 PSA 1.0 ?-12/14/2020 PSA 6.4 ?-01/13/2021 PSA 10.6 ?-04/26/2021 PSA  45.2 ?-05/16/2021 PSA 70 ?-Abiraterone/prednisone discontinued 05/16/2021 ?-05/16/2021 left hip x-ray-multifocal sclerotic metastasis throughout the pelvis, confluent involving the left acetabulum.  No acute or pathologic fracture.  Left hip osteoarthritis. ?-Cycle 1 docetaxel 05/31/2021 ?-05/31/2021 PSA 113 ?-Palliative radiation to the left acetabulum 06/08/2021 - 06/20/2021 ?-Cycle 2 docetaxel 06/21/2021 ?-06/21/2021 PSA improved at 68 ?-Cycle 3 docetaxel 07/12/2021 ?-07/12/2021 PSA improved at 52 ?-Cycle 4 docetaxel 08/02/2021 ?  ?2.  Severe anemia-likely secondary to metastatic prostate cancer involving the bones ?3.  Mild thrombocytopenia ?4.  Cirrhosis with splenomegaly ?5.  History of hepatitis C ?6.  History of polysubstance abuse ?7.  Depression ?8.  Tobacco dependence ?9.  Right arm weakness, right facial numbness-potentially related to nerve root compromise from metastatic bone lesions ?10.  Pain secondary to #1 ?11.  Extensive bone metastases-every 18-month Zometa starting 04/01/2020 ?12.  Gout-acute flare right wrist 04/01/2020 treated with indomethacin, allopurinol resumed ?13.  Right hepatic lobe mass with elevated AFP concerning for HCC ?MRI abdomen 12/21/2020-hypoenhancing inferior right liver mass, segment 6, occluding or compressing the adjacent right portal vein branch, intrahepatic cholangiocarcinoma favored with differential including hepatocellular carcinoma, Li-Rads M ?Y 90 planning study 11-22 ?Y90 right lobe of liver 06/26/2021 ?14.  Admission 12/19/2020 with an NSTEMI ?Catheterization 12/21/2020-severe multivessel CAD, 99% proximal LAD stenosis, LAD stent placed ?15. INVITAE genetic panel  01/13/2021-pathogenic variant in ATM and VUS in CHEK2 ?  ? ?  Disposition: Mr. Eisenberg appears stable.  He has completed 3 cycles of docetaxel.  He continues to tolerate the chemotherapy well.  Most recent PSA was further improved.  Plan to proceed with cycle 4 today as scheduled.  He will receive Lupron today as well. ? ?CBC from today reviewed.  Counts adequate to proceed with treatment. ? ?He reports recent recurrence of intermittent chest pain similar to what he was experiencing prior to stent placement.  We are contacting cardiology for an appointment. ? ?He will return for lab, follow-up, docetaxel in 3 weeks.  He will contact the office in the interim with any problems. ? ? ? ?Ned Card ANP/GNP-BC  ? ?08/02/2021  ?9:04 AM ? ? ? ? ? ? ? ?

## 2021-08-02 NOTE — Telephone Encounter (Signed)
Patient was complaint of having  intermittent chest pain.I called [facility]. Nishan's office and spoke with Jan about getting the patient in for an appointment. Jan stated the next appointment will be May 15. However she will speak to the triage nurse and give the patient a call to see how urgent of his symptoms are.The patient stated the chest pain started about two weeks ago. Yesterday, he had chest pain. I ask the patient what was he doing patient stated he was working. He take aspirin and the pain went away. He do not take his nitrostat, he was not sure what the pill was for. I advice the patient if the chest pain come back to take a nitrostat, if the nitrostat does not work to call the EMS to go the hospital. Patient gave verbal understanding .  ?

## 2021-08-02 NOTE — Progress Notes (Signed)
Patient seen by Ned Card NP today ? ?Vitals are within treatment parameters. ? ?Labs reviewed by Ned Card NP and are within treatment parameters. ? ?Per physician team, patient is ready for treatment and there are NO modifications to the treatment plan. Will also need to have Lupon injection today. ?

## 2021-08-02 NOTE — Progress Notes (Signed)
Patient presents for treatment. RN assessment completed along with the following: ? ?Labs/vitals reviewed - Yes, and within treatment parameters.   ?Weight within 10% of previous measurement - Yes ?Informed consent completed and reflects current therapy/intent - Yes, on date 05/31/21             ?Provider progress note reviewed - Yes, today's provider note was reviewed. ?Treatment/Antibody/Supportive plan reviewed - Yes, and patient to receive Eligard today along with chemo.  ?S&H and other orders reviewed - Yes, and there are no additional orders identified. ?Previous treatment date reviewed - Yes, and the appropriate amount of time has elapsed between treatments. ?Clinic Hand Off Received from - Merceda Elks, RN ? ?Patient to proceed with treatment.   ?

## 2021-08-02 NOTE — Progress Notes (Incomplete)
CARDIOLOGY CONSULT NOTE  ? ? ? ? ? ?Patient ID: ?Edgar Mooney ?MRN: [id number] ?DOB/AGE: [birth year] 56 y.o. ? ?Referring Physician: Marijean [facility] ?Primary Physician: Patient, No Pcp Per (Inactive) ?Primary Cardiologist: Johnsie Cancel ?Reason for Consultation: CAD ? ? ? ?HPI:  56 y.o. first seen for chest pain in ER 12/19/20 History of metastatic prostate cancer On baseline narcotics for bone mets including epidural ventral mets in sacram and C spine. Also ? New liver malignancy Cath done 12/21/20 99% proximal LAD that was stented Came back the following day for stenting of the proximal and mid RCA TTE showed EF 50-55% ? ?Long discussion with him about med compliance Has not been taking ASA/Brilinta regularly And is at risk for stent thrombosis No chest pain Still drinking beer daily  ? ?Called office 4/19 indicating chest pain intermittent last 2 weeks Pain went away with ASA Did not understand to take nitro  ? ?Completed cycle 3 of docetaxel 07/12/21 Takes about 4 oxycodone / day ? ?Labs 08/02/2021  with Hct 32.6 PLT 124 K 3.7 Cr 0.62 Alk Phos elevated mild 131  ? ?Oncology note mentions cirrhosis with splenomegaly and history of Hepatitis C Also has elevated AFP with HCC by MRI with Y90 radio embolization right lobe liver 06/26/21  ? ?*** ? ?ROS ?All other systems reviewed and negative except as noted above ? ?Past Medical History:  ?Diagnosis Date  ?? Alcohol abuse   ?? Cocaine abuse ([place])   ?? Depression   ?? Gout   ?? Hep C w/o coma, chronic [facility]) diagnosed May 2016  ?  ?Family History  ?Problem Relation Age of Onset  ?? Cancer Mother   ?? Cancer Father   ?  ?Social History  ? ?Socioeconomic History  ?? Marital status: Single  ?  Spouse name: Not on file  ?? Number of children: Not on file  ?? Years of education: Not on file  ?? Highest education level: Not on file  ?Occupational History  ?? Not on file  ?Tobacco Use  ?? Smoking status: Every Day  ?  Packs/day: 1.00  ?  Years: 25.00  ?  Pack years: 25.00  ?  Types: Cigarettes  ??  Smokeless tobacco: Never  ?Substance and Sexual Activity  ?? Alcohol use: Not Currently  ?  Alcohol/week: 126.0 standard drinks  ?  Types: 126 Cans of beer per week  ?  Comment: 18 pack per day  ?? Drug use: Yes  ?  Types: Cocaine, IV, Heroin, Marijuana  ?  Comment: Used crack and cocaine 04/02/16  ?? Sexual activity: Not Currently  ?Other Topics Concern  ?? Not on file  ?Social History Narrative  ?? Not on file  ? ?Social Determinants of Health  ? ?Financial Resource Strain: High Risk  ?? Difficulty of Paying Living Expenses: Hard  ?Food Insecurity: Food Insecurity Present  ?? Worried About Charity fundraiser in the Last Year: Sometimes true  ?? Ran Out of Food in the Last Year: Never true  ?Transportation Needs: No Transportation Needs  ?? Lack of Transportation (Medical): No  ?? Lack of Transportation (Non-Medical): No  ?Physical Activity: Not on file  ?Stress: Not on file  ?Social Connections: Moderately Isolated  ?? Frequency of Communication with Friends and Family: Once a week  ?? Frequency of Social Gatherings with Friends and Family: Once a week  ?? Attends Religious Services: 1 to 4 times per year  ?? Active Member of Clubs or Organizations: No  ?? Attends Club  or Organization Meetings: 1 to 4 times per year  ?? Marital Status: Separated  ?Intimate Partner Violence: Not At Risk  ?? Fear of Current or Ex-Partner: No  ?? Emotionally Abused: No  ?? Physically Abused: No  ?? Sexually Abused: No  ?  ?Past Surgical History:  ?Procedure Laterality Date  ?? CORONARY STENT INTERVENTION N/A 12/21/2020  ? Procedure: CORONARY STENT INTERVENTION;  Surgeon: Troy Sine, MD;  Location: [facility];  Service: Cardiovascular;  Laterality: N/A;  ?? CORONARY STENT INTERVENTION N/A 12/22/2020  ? Procedure: CORONARY STENT INTERVENTION;  Surgeon: Burnell Blanks, MD;  Location: [facility];  Service: Cardiovascular;  Laterality: N/A;  ?? LEFT HEART CATH AND CORONARY ANGIOGRAPHY N/A 12/21/2020  ? Procedure:  LEFT HEART CATH AND CORONARY ANGIOGRAPHY;  Surgeon: Troy Sine, MD;  Location: [facility];  Service: Cardiovascular;  Laterality: N/A;  ?  ? ? ?Current Outpatient Medications:  ??  abiraterone acetate (ZYTIGA) 250 MG tablet, TAKE 4 TABLETS (1,000 MG TOTAL) BY MOUTH DAILY. TAKE ON AN EMPTY STOMACH 1 HOUR BEFORE OR 2 HOURS AFTER A MEAL, Disp: 120 tablet, Rfl: 0 ??  allopurinol (ZYLOPRIM) 100 MG tablet, TAKE 1 TABLET BY MOUTH EVERY DAY START ON 12/24, Disp: 90 tablet, Rfl: 1 ??  amLODipine (NORVASC) 5 MG tablet, Take 1 tablet (5 mg total) by mouth daily., Disp: 90 tablet, Rfl: 3 ??  aspirin 81 MG chewable tablet, Chew 1 tablet (81 mg total) by mouth daily., Disp: 90 tablet, Rfl: 3 ??  ferrous gluconate (FERGON) 324 MG tablet, Take 1 tablet (324 mg total) by mouth 2 (two) times daily with a meal., Disp: 30 tablet, Rfl: 3 ??  indomethacin (INDOCIN) 50 MG capsule, Take 1 capsule (50 mg total) by mouth 3 (three) times daily with meals. For gout flare, Disp: 15 capsule, Rfl: 0 ??  nitroGLYCERIN (NITROSTAT) 0.4 MG SL tablet, Place 1 tablet (0.4 mg total) under the tongue every 5 (five) minutes as needed for chest pain., Disp: 25 tablet, Rfl: 12 ??  ondansetron (ZOFRAN) 4 MG tablet, Take 1 tablet (4 mg total) by mouth every 6 (six) hours as needed for nausea., Disp: 20 tablet, Rfl: 0 ??  oxyCODONE-acetaminophen (PERCOCET/ROXICET) 5-325 MG tablet, Take 1 tablet by mouth every 4 (four) hours as needed for severe pain., Disp: 60 tablet, Rfl: 0 ??  predniSONE (DELTASONE) 5 MG tablet, TAKE 1 TABLET (5 MG TOTAL) BY MOUTH DAILY WITH BREAKFAST. (Patient taking differently: Take 5 mg by mouth daily with breakfast.), Disp: 30 tablet, Rfl: 5 ??  rosuvastatin (CRESTOR) 20 MG tablet, Take 1 tablet (20 mg total) by mouth daily., Disp: 90 tablet, Rfl: 0 ??  tamsulosin (FLOMAX) 0.4 MG CAPS capsule, TAKE 1 CAPSULE BY MOUTH EVERY DAY AFTER SUPPER (Patient taking differently: Take 0.4 mg by mouth daily.), Disp: 90 capsule, Rfl:  1 ??  ticagrelor (BRILINTA) 90 MG TABS tablet, Take 1 tablet (90 mg total) by mouth 2 (two) times daily., Disp: 120 tablet, Rfl: 3 ? ? ? ?Physical Exam: ?Blood pressure (!) 120/54, pulse 79, height _0  (1.575 m), weight 73.5 kg, SpO2 97 %.   ? ?Affect appropriate ?Chronically ill male  ?HEENT: normal ?Neck supple with no adenopathy ?JVP normal no bruits no thyromegaly ?Lungs clear with no wheezing and good diaphragmatic motion ?Heart:  S1/S2 no murmur, no rub, gallop or click ?PMI normal ?Abdomen: benighn, BS positve, no tenderness, no AAA ?no bruit.  No HSM or HJR ?Distal pulses intact  with no bruits ?No edema ?Neuro non-focal ?Skin warm and dry ?No muscular weakness ? ? ?Labs: ?  ?Lab Results  ?Component Value Date  ? WBC 7.1 12/23/2020  ? HGB 10.9 (L) 12/23/2020  ? HCT 31.9 (L) 12/23/2020  ? MCV 92.5 12/23/2020  ? PLT 150 12/23/2020  ? No results for input(s): NA, K, CL, CO2, BUN, CREATININE, CALCIUM, PROT, BILITOT, ALKPHOS, ALT, AST, GLUCOSE in the last 168 hours. ? ?Invalid input(s): LABALBU ?No results found for: CKTOTAL, CKMB, CKMBINDEX, TROPONINI  ?Lab Results  ?Component Value Date  ? CHOL 201 (H) 12/19/2020  ? CHOL 196 12/19/2017  ? ?Lab Results  ?Component Value Date  ? HDL 36 (L) 12/19/2020  ? HDL 29 (L) 12/19/2017  ? ?Lab Results  ?Component Value Date  ? LDLCALC 115 (H) 12/19/2020  ? [place] Comment 12/19/2017  ? ?Lab Results  ?Component Value Date  ? TRIG 248 (H) 12/19/2020  ? TRIG 251 (H) 04/01/2020  ? TRIG 409 (H) 12/19/2017  ? ?Lab Results  ?Component Value Date  ? CHOLHDL 5.6 12/19/2020  ? CHOLHDL 6.8 (H) 12/19/2017  ? ?Lab Results  ?Component Value Date  ? LDLDIRECT 94 02/27/2018  ?  ?  ?Radiology: ?DG Chest 2 View ? ?Result Date: 12/19/2020 ?CLINICAL DATA:  Chest pain, diaphoresis EXAM: CHEST - 2 VIEW COMPARISON:  03/03/2020 FINDINGS: Lower lung volumes with similar diffuse bilateral interstitial opacities may represent chronic lung disease. Minor basilar atelectasis. Normal heart size and  vascularity. No definite focal pneumonia, collapse or consolidation. Negative for effusion or pneumothorax. Trachea midline. Degenerative changes of the spine. IMPRESSION: Similar chronic interstitial lung cha

## 2021-08-02 NOTE — Telephone Encounter (Signed)
Per Dr. Johnsie Cancel, need to arrange cath for Monday / Tuesday Call him in imdur 15 mg daily To ER for any prolonged pain had labs with oncology today. Called patient, arrange for patient to have heart cath with Dr. Angelena Form on Tuesday. Patient will keep his appointment of Friday to discuss heart cath and per heart cath protocol, office visit within 30 days of heart cath. Patient will receive instruction letter at visit. Sent in Imdur 15 mg daily and other cardiac medication refills to pharmacy of choice. Patient verbalized understanding. ?

## 2021-08-02 NOTE — Telephone Encounter (Signed)
Called patient about message. Patient stated that he was having left sided chest pain that comes and goes. Patient stated he is not having any chest pain right now. Patient stated he had chest pain yesterday that started while he was in the car. Patient stated he got home and sat down and cooled off and chest pain went away. Patient stated chest pain was similar to the pain he had right before his heart cath. Encouraged patient to use his nitroglycerin when he has chest pain again. Informed patient on how to use nitroglycerin. Patient verbalized understanding. Made patient an appointment with DOD, Dr. Johnsie Cancel, on Friday to get evaluated.  ?

## 2021-08-02 NOTE — Patient Instructions (Signed)
[facility]   ?Discharge Instructions: ?Thank you for choosing [facility] to provide your oncology and hematology care.  ? ?If you have a lab appointment with the [facility], please go directly to the [facility] and check in at the registration area. ?  ?Wear comfortable clothing and clothing appropriate for easy access to any Portacath or PICC line.  ? ?We strive to give you quality time with your provider. You may need to reschedule your appointment if you arrive late (15 or more minutes).  Arriving late affects you and other patients whose appointments are after yours.  Also, if you miss three or more appointments without notifying the office, you may be dismissed from the clinic at the provider?s discretion.    ?  ?For prescription refill requests, have your pharmacy contact our office and allow 72 hours for refills to be completed.   ? ?Today you received the following chemotherapy and/or immunotherapy agents Docetaxel (TAXOTERE).    ?  ?To help prevent nausea and vomiting after your treatment, we encourage you to take your nausea medication as directed. ? ?BELOW ARE SYMPTOMS THAT SHOULD BE REPORTED IMMEDIATELY: ?*FEVER GREATER THAN 100.4 F (38 ?C) OR HIGHER ?*CHILLS OR SWEATING ?*NAUSEA AND VOMITING THAT IS NOT CONTROLLED WITH YOUR NAUSEA MEDICATION ?*UNUSUAL SHORTNESS OF BREATH ?*UNUSUAL BRUISING OR BLEEDING ?*URINARY PROBLEMS (pain or burning when urinating, or frequent urination) ?*BOWEL PROBLEMS (unusual diarrhea, constipation, pain near the anus) ?TENDERNESS IN MOUTH AND THROAT WITH OR WITHOUT PRESENCE OF ULCERS (sore throat, sores in mouth, or a toothache) ?UNUSUAL RASH, SWELLING OR PAIN  ?UNUSUAL VAGINAL DISCHARGE OR ITCHING  ? ?Items with * indicate a potential emergency and should be followed up as soon as possible or go to the Emergency Department if any problems should occur. ? ?Please show the CHEMOTHERAPY ALERT CARD or IMMUNOTHERAPY ALERT CARD at  check-in to the Emergency Department and triage nurse. ? ?Should you have questions after your visit or need to cancel or reschedule your appointment, please contact [facility]  Dept: [phone]  and follow the prompts.  Office hours are 8:00 a.m. to 4:30 p.m. Monday - Friday. Please note that voicemails left after 4:00 p.m. may not be returned until the following business day.  We are closed weekends and major holidays. You have access to a nurse at all times for urgent questions. Please call the main number to the clinic Dept: [phone] and follow the prompts. ? ? ?For any non-urgent questions, you may also contact your provider using MyChart. We now offer e-Visits for anyone 30 and older to request care online for non-urgent symptoms. For details visit [email or web address]. ?  ?Also download the MyChart app! Go to the app store, search "MyChart", open the app, select , and log in with your MyChart username and password. ? ?Due to Covid, a mask is required upon entering the hospital/clinic. If you do not have a mask, one will be given to you upon arrival. For doctor visits, patients may have 1 support person aged 50 or older with them. For treatment visits, patients cannot have anyone with them due to current Covid guidelines and our immunocompromised population.  ? ?Docetaxel injection ?What is this medication? ?DOCETAXEL (doe se TAX el) is a chemotherapy drug. It targets fast dividing cells, like cancer cells, and causes these cells to die. This medicine is used to treat many types of cancers like breast cancer, certain stomach cancers, head and  neck cancer, lung cancer, and prostate cancer. ?This medicine may be used for other purposes; ask your health care provider or pharmacist if you have questions. ?COMMON BRAND NAME(S): Docefrez, Taxotere ?What should I tell my care team before I take this medication? ?They need to know if you have any of these  conditions: ?infection (especially a virus infection such as chickenpox, cold sores, or herpes) ?liver disease ?low blood counts, like low white cell, platelet, or red cell counts ?an unusual or allergic reaction to docetaxel, polysorbate 80, other chemotherapy agents, other medicines, foods, dyes, or preservatives ?pregnant or trying to get pregnant ?breast-feeding ?How should I use this medication? ?This drug is given as an infusion into a vein. It is administered in a hospital or clinic by a specially trained health care professional. ?Talk to your pediatrician regarding the use of this medicine in children. Special care may be needed. ?Overdosage: If you think you have taken too much of this medicine contact a poison control center or emergency room at once. ?NOTE: This medicine is only for you. Do not share this medicine with others. ?What if I miss a dose? ?It is important not to miss your dose. Call your doctor or health care professional if you are unable to keep an appointment. ?What may interact with this medication? ?Do not take this medicine with any of the following medications: ?live virus vaccines ?This medicine may also interact with the following medications: ?aprepitant ?certain antibiotics like erythromycin or clarithromycin ?certain antivirals for HIV or hepatitis ?certain medicines for fungal infections like fluconazole, itraconazole, ketoconazole, posaconazole, or voriconazole ?cimetidine ?ciprofloxacin ?conivaptan ?cyclosporine ?dronedarone ?fluvoxamine ?grapefruit juice ?imatinib ?verapamil ?This list may not describe all possible interactions. Give your health care provider a list of all the medicines, herbs, non-prescription drugs, or dietary supplements you use. Also tell them if you smoke, drink alcohol, or use illegal drugs. Some items may interact with your medicine. ?What should I watch for while using this medication? ?Your condition will be monitored carefully while you are receiving  this medicine. You will need important blood work done while you are taking this medicine. ?Call your doctor or health care professional for advice if you get a fever, chills or sore throat, or other symptoms of a cold or flu. Do not treat yourself. This drug decreases your body's ability to fight infections. Try to avoid being around people who are sick. ?Some products may contain alcohol. Ask your health care professional if this medicine contains alcohol. Be sure to tell all health care professionals you are taking this medicine. Certain medicines, like metronidazole and disulfiram, can cause an unpleasant reaction when taken with alcohol. The reaction includes flushing, headache, nausea, vomiting, sweating, and increased thirst. The reaction can last from 30 minutes to several hours. ?You may get drowsy or dizzy. Do not drive, use machinery, or do anything that needs mental alertness until you know how this medicine affects you. Do not stand or sit up quickly, especially if you are an older patient. This reduces the risk of dizzy or fainting spells. Alcohol may interfere with the effect of this medicine. ?Talk to your health care professional about your risk of cancer. You may be more at risk for certain types of cancer if you take this medicine. ?Do not become pregnant while taking this medicine or for 6 months after stopping it. Women should inform their doctor if they wish to become pregnant or think they might be pregnant. There is a potential for serious side  effects to an unborn child. Talk to your health care professional or pharmacist for more information. Do not breast-feed an infant while taking this medicine or for 1 week after stopping it. ?Males who get this medicine must use a condom during sex with females who can get pregnant. If you get a woman pregnant, the baby could have birth defects. The baby could die before they are born. You will need to continue wearing a condom for 3 months after  stopping the medicine. Tell your health care provider right away if your partner becomes pregnant while you are taking this medicine. ?This may interfere with the ability to father a child. You should talk to your doctor

## 2021-08-02 NOTE — Telephone Encounter (Signed)
Patient ws at the [facility]. The staff member called while the patient was there as the patient told the Cancer provider he was having chest pain. Patient was asked the following questions  ? ? ?Pt c/o of Chest Pain: STAT if CP now or developed within 24 hours ? ?1. Are you having CP right now? no ? ?2. Are you experiencing any other symptoms (ex. SOB, nausea, vomiting, sweating)? A little nausea  ? ?3. How long have you been experiencing CP?  ? ?4. Is your CP continuous or coming and going? Came and went  ? ?5. Have you taken Nitroglycerin? No but patient did take an aspirin yesterday  ??  ?

## 2021-08-02 NOTE — Addendum Note (Signed)
Addended by: Aris Georgia, Kharson Rasmusson L on: 08/02/2021 02:28 PM ? ? Modules accepted: Orders ? ?

## 2021-08-03 LAB — PROSTATE-SPECIFIC AG, SERUM (LABCORP): Prostate Specific Ag, Serum: 74.6 ng/mL — ABNORMAL HIGH (ref 0.0–4.0)

## 2021-08-04 ENCOUNTER — Inpatient Hospital Stay: Payer: Medicaid Other

## 2021-08-04 ENCOUNTER — Ambulatory Visit: Payer: Medicaid Other | Admitting: Cardiovascular Disease

## 2021-08-07 ENCOUNTER — Telehealth: Payer: Self-pay | Admitting: *Deleted

## 2021-08-07 ENCOUNTER — Inpatient Hospital Stay: Payer: Medicaid Other

## 2021-08-07 ENCOUNTER — Encounter: Payer: Self-pay | Admitting: Oncology

## 2021-08-07 ENCOUNTER — Encounter: Payer: Self-pay | Admitting: Cardiovascular Disease

## 2021-08-07 ENCOUNTER — Ambulatory Visit (INDEPENDENT_AMBULATORY_CARE_PROVIDER_SITE_OTHER): Payer: Medicaid Other | Admitting: Cardiovascular Disease

## 2021-08-07 VITALS — BP 141/75 | HR 87 | Temp 98.6°F | Resp 18

## 2021-08-07 VITALS — BP 126/76 | HR 85 | Ht 62.0 in | Wt 158.4 lb

## 2021-08-07 DIAGNOSIS — C61 Malignant neoplasm of prostate: Secondary | ICD-10-CM

## 2021-08-07 DIAGNOSIS — I25110 Atherosclerotic heart disease of native coronary artery with unstable angina pectoris: Secondary | ICD-10-CM

## 2021-08-07 DIAGNOSIS — Z5111 Encounter for antineoplastic chemotherapy: Secondary | ICD-10-CM | POA: Diagnosis not present

## 2021-08-07 MED ORDER — PEGFILGRASTIM-BMEZ 6 MG/0.6ML ~~LOC~~ SOSY
6.0000 mg | PREFILLED_SYRINGE | Freq: Once | SUBCUTANEOUS | Status: AC
  Administered 2021-08-07: 6 mg via SUBCUTANEOUS
  Filled 2021-08-07: qty 0.6

## 2021-08-07 NOTE — Patient Instructions (Signed)

## 2021-08-07 NOTE — Patient Instructions (Signed)
Medication Instructions:  ?No changes ?*If you need a refill on your cardiac medications before your next appointment, please call your pharmacy* ? ? ?Lab Work: ?none ? ?Testing/Procedures: ?Your physician has requested that you have a cardiac catheterization. Cardiac catheterization is used to diagnose and/or treat various heart conditions. Doctors may recommend this procedure for a number of different reasons. The most common reason is to evaluate chest pain. Chest pain can be a symptom of coronary artery disease (CAD), and cardiac catheterization can show whether plaque is narrowing or blocking your heart?s arteries. This procedure is also used to evaluate the valves, as well as measure the blood flow and oxygen levels in different parts of your heart. For further information please visit [email or web address]. Please follow instruction sheet, as given. ? ? ?Follow-Up: ?At [facility], you and your health needs are our priority.  As part of our continuing mission to provide you with exceptional heart care, we have created designated Provider Care Teams.  These Care Teams include your primary Cardiologist (physician) and Advanced Practice Providers (APPs -  Physician Assistants and Nurse Practitioners) who all work together to provide you with the care you need, when you need it. ? ?Your next appointment:   ?3-4 week(s) ? ?The format for your next appointment:   ?In Person ? ?Provider:   ?[name], MD ? ? ? ?

## 2021-08-07 NOTE — H&P (View-Only) (Signed)
? ? ?Chief Complaint  ?Patient presents with  ? Follow-up  ?  Unstable angina  ? ?History of Present Illness: 56 yo male with history of HTN, metastatic prostate cancer, prior polysubstance abuse, ongoing tobacco abuse, depression, hepatitis C and CAD with previous coronary stenting here today as an add on to my doctor of the day schedule for evaluation of chest pain.Edgar Mooney He is followed in our office by Dr. Johnsie Cancel. He was admitted to [facility] September 2022 with unstable angina. Cardiac cath with severe disease in the LAD and RCA, both treated with drug eluting stents. Echo 12/19/20 with LVEF=50-55%. No valve disease. He called our office last week with c/o chest pain. Dr. Johnsie Cancel arranged a cardiac cath for this week but the patient did not show up for his office visit last week to discuss the cath.  ? ?He tells me today that he did well after his PCI in September 202 and has had no chest pain until last week. He was helping a friend move some things and he began to have severe left sided chest pain that lasted for 20 minutes. This recurred again over the weekend and his pain resolved quickly with NTG. No pain today. He was given Imdur last week but never started it. He is on the schedule tomorrow for cardiac cath.  He is currently undergoing chemotherapy for prostate cancer and has completed radiation.  ? ?Primary Care Physician: Patient, No Pcp Per (Inactive) ?Primary Cardiology: Johnsie Cancel ? ?Past Medical History:  ?Diagnosis Date  ? Alcohol abuse   ? Cocaine abuse ([place])   ? Depression   ? Gout   ? Hep C w/o coma, chronic [facility]) diagnosed May 2016  ? History of radiation therapy   ? left hip 06/08/2021-06/20/2021  Dr Gery Pray  ? 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 ? ?Past Surgical History:  ?Procedure Laterality Date  ? CORONARY STENT INTERVENTION N/A 12/21/2020  ? Procedure: CORONARY STENT INTERVENTION;  Surgeon: Troy Sine, MD;  Location: [place] [facility];  Service: Cardiovascular;  Laterality: N/A;  ?  CORONARY STENT INTERVENTION N/A 12/22/2020  ? Procedure: CORONARY STENT INTERVENTION;  Surgeon: Burnell Blanks, MD;  Location: [place] [facility];  Service: Cardiovascular;  Laterality: N/A;  ? IR 3D INDEPENDENT WKST  02/15/2021  ? IR ANGIOGRAM SELECTIVE EACH ADDITIONAL VESSEL  02/15/2021  ? IR ANGIOGRAM SELECTIVE EACH ADDITIONAL VESSEL  02/15/2021  ? IR ANGIOGRAM SELECTIVE EACH ADDITIONAL VESSEL  06/26/2021  ? IR ANGIOGRAM SELECTIVE EACH ADDITIONAL VESSEL  06/28/2021  ? IR ANGIOGRAM VISCERAL SELECTIVE  02/15/2021  ? IR ANGIOGRAM VISCERAL SELECTIVE  02/15/2021  ? IR ANGIOGRAM VISCERAL SELECTIVE  06/26/2021  ? IR EMBO TUMOR ORGAN ISCHEMIA INFARCT INC GUIDE ROADMAPPING  06/26/2021  ? IR RADIOLOGIST EVAL & MGMT  01/11/2021  ? IR RADIOLOGIST EVAL & MGMT  07/27/2021  ? IR US GUIDE VASC ACCESS RIGHT  02/15/2021  ? IR US GUIDE VASC ACCESS RIGHT  06/26/2021  ? LEFT HEART CATH AND CORONARY ANGIOGRAPHY N/A 12/21/2020  ? Procedure: LEFT HEART CATH AND CORONARY ANGIOGRAPHY;  Surgeon: Troy Sine, MD;  Location: [place] [facility];  Service: Cardiovascular;  Laterality: N/A;  ? ? ?Current Outpatient Medications  ?Medication Sig Dispense Refill  ? allopurinol (ZYLOPRIM) 100 MG tablet TAKE 1 TABLET BY MOUTH EVERY DAY 90 tablet 1  ? amLODipine (NORVASC) 5 MG tablet Take 1 tablet (5 mg total) by mouth daily. 90 tablet 3  ? aspirin 81 MG chewable tablet  Chew 1 tablet (81 mg total) by mouth daily. 90 tablet 3  ? nitroGLYCERIN (NITROSTAT) 0.4 MG SL tablet Place 1 tablet (0.4 mg total) under the tongue every 5 (five) minutes as needed for chest pain. 25 tablet 12  ? oxyCODONE (OXY IR/ROXICODONE) 5 MG immediate release tablet Take 1 tablet (5 mg total) by mouth every 6 (six) hours as needed for severe pain. (Patient taking differently: Take 5 mg by mouth in the morning, at noon, in the evening, and at bedtime.) 60 tablet 0  ? predniSONE (DELTASONE) 5 MG tablet Take 1 tablet (5 mg total) by mouth 2 (two) times daily with a meal. 60 tablet 2   ? rosuvastatin (CRESTOR) 20 MG tablet Take 1 tablet (20 mg total) by mouth daily. 90 tablet 3  ? tamsulosin (FLOMAX) 0.4 MG CAPS capsule Take 1 capsule (0.4 mg total) by mouth daily. 90 capsule 0  ? ticagrelor (BRILINTA) 90 MG TABS tablet Take 1 tablet (90 mg total) by mouth 2 (two) times daily. 120 tablet 3  ? isosorbide mononitrate (IMDUR) 30 MG 24 hr tablet Take 0.5 tablets (15 mg total) by mouth daily. (Patient not taking: Reported on 08/07/2021) 45 tablet 3  ? ?Current Facility-Administered Medications  ?Medication Dose Route Frequency Provider Last Rate Last Admin  ? sodium chloride flush (NS) 0.9 % injection 3 mL  3 mL Intravenous Q12H [name], MD      ? ? ?No Known Allergies ? ?Social History  ? ?Socioeconomic History  ? Marital status: Single  ?  Spouse name: Not on file  ? Number of children: Not on file  ? Years of education: Not on file  ? Highest education level: Not on file  ?Occupational History  ? Not on file  ?Tobacco Use  ? Smoking status: Every Day  ?  Packs/day: 1.00  ?  Years: 25.00  ?  Pack years: 25.00  ?  Types: Cigarettes  ? Smokeless tobacco: Never  ?Substance and Sexual Activity  ? Alcohol use: Not Currently  ?  Alcohol/week: 126.0 standard drinks  ?  Types: 126 Cans of beer per week  ?  Comment: 18 pack per day  ? Drug use: Yes  ?  Types: Cocaine, IV, Heroin, Marijuana  ?  Comment: Used crack and cocaine 04/02/16  ? Sexual activity: Not Currently  ?Other Topics Concern  ? Not on file  ?Social History Narrative  ? Not on file  ? ?Social Determinants of Health  ? ?Financial Resource Strain: Not on file  ?Food Insecurity: Not on file  ?Transportation Needs: Not on file  ?Physical Activity: Not on file  ?Stress: Not on file  ?Social Connections: Not on file  ?Intimate Partner Violence: Not on file  ? ? ?Family History  ?Problem Relation Age of Onset  ? Cancer Mother   ? Cancer Father   ? ? ?Review of Systems:  As stated in the HPI and otherwise negative.  ? ?BP 126/76   Pulse 85    Ht 5' 2" (1.575 m)   Wt 158 lb 6.4 oz (71.8 kg)   SpO2 99%   BMI 28.97 kg/m?  ? ?Physical Examination: ?General: Well developed, well nourished, NAD  ?HEENT: OP clear, mucus membranes moist  ?SKIN: warm, dry. No rashes. ?Neuro: No focal deficits  ?Musculoskeletal: Muscle strength 5/5 all ext  ?Psychiatric: Mood and affect normal  ?Neck: No JVD, no carotid bruits, no thyromegaly, no lymphadenopathy.  ?Lungs:Clear bilaterally, no wheezes, rhonci, crackles ?Cardiovascular: Regular  rate and rhythm. No murmurs, gallops or rubs. ?Abdomen:Soft. Bowel sounds present. Non-tender.  ?Extremities: No lower extremity edema. Pulses are 2 + in the bilateral DP/PT. ? ?EKG:  EKG is ordered today. ?The ekg ordered today demonstrates Sinus, LVH, QTc 490 msec ? ?Recent Labs: ?12/19/2020: Magnesium 2.0 ?08/02/2021: ALT 19; BUN 10; Creatinine 0.62; Hemoglobin 11.4; Platelet Count 124; Potassium 3.7; Sodium 135  ? ?Lipid Panel ?   ?Component Value Date/Time  ? CHOL 201 (H) 12/19/2020 1648  ? CHOL 196 12/19/2017 1835  ? TRIG 248 (H) 12/19/2020 1648  ? HDL 36 (L) 12/19/2020 1648  ? HDL 29 (L) 12/19/2017 1835  ? CHOLHDL 5.6 12/19/2020 1648  ? VLDL 50 (H) 12/19/2020 1648  ? LDLCALC 115 (H) 12/19/2020 1648  ? [place] Comment 12/19/2017 1835  ? LDLDIRECT 94 02/27/2018 1919  ? ?  ?Wt Readings from Last 3 Encounters:  ?08/07/21 158 lb 6.4 oz (71.8 kg)  ?08/02/21 158 lb 6.4 oz (71.8 kg)  ?07/27/21 157 lb 3.2 oz (71.3 kg)  ?  ?Assessment and Plan:  ? ?1. CAD with unstable angina: He is having chest pain worrisome for unstable angina. Last cath in September 2022 with severe disease in the LAD and RCA, both treated with drug eluting stents. Cardiac cath is indicated. With plan cardiac cath tomorrow at [facility]. This has already been scheduled by Dr. Johnsie Cancel.  ? ? I have reviewed the risks, indications, and alternatives to cardiac catheterization, possible angioplasty, and stenting with the patient. Risks include but are not limited to bleeding, infection,  vascular injury, stroke, myocardial infection, arrhythmia, kidney injury, radiation-related injury in the case of prolonged fluoroscopy use, emergency cardiac surgery, and death. The patient understands the ris

## 2021-08-07 NOTE — Telephone Encounter (Signed)
Cardiac Catheterization scheduled at [facility] for: Tuesday August 08, 2021 12 Noon ?Arrival time and place: [place] [facility] A at: 10 AM ? ? ?No solid food after midnight prior to cath, clear liquids until 5 AM day of procedure. ? ?Medication instructions: ?-Usual morning medications can be taken with sips of water including aspirin 81 mg and Brilinta 90 mg. ? ?Confirmed patient has responsible adult to drive home post procedure and be with patient first 24 hours after arriving home. ? ?Left detailed message with instructions on name ID voicemail, asked patient to call if any new symptoms for COVID-19 in past 10 days.  ? ?

## 2021-08-07 NOTE — Progress Notes (Signed)
? ? ?Chief Complaint  ?Patient presents with  ? Follow-up  ?  Unstable angina  ? ?History of Present Illness: 56 yo male with history of HTN, metastatic prostate cancer, prior polysubstance abuse, ongoing tobacco abuse, depression, hepatitis C and CAD with previous coronary stenting here today as an add on to my doctor of the day schedule for evaluation of chest pain.Marland Kitchen He is followed in our office by Dr. Johnsie Cancel. He was admitted to [facility] September 2022 with unstable angina. Cardiac cath with severe disease in the LAD and RCA, both treated with drug eluting stents. Echo 12/19/20 with LVEF=50-55%. No valve disease. He called our office last week with c/o chest pain. Dr. Johnsie Cancel arranged a cardiac cath for this week but the patient did not show up for his office visit last week to discuss the cath.  ? ?He tells me today that he did well after his PCI in September 202 and has had no chest pain until last week. He was helping a friend move some things and he began to have severe left sided chest pain that lasted for 20 minutes. This recurred again over the weekend and his pain resolved quickly with NTG. No pain today. He was given Imdur last week but never started it. He is on the schedule tomorrow for cardiac cath.  He is currently undergoing chemotherapy for prostate cancer and has completed radiation.  ? ?Primary Care Physician: Patient, No Pcp Per (Inactive) ?Primary Cardiology: Johnsie Cancel ? ?Past Medical History:  ?Diagnosis Date  ? Alcohol abuse   ? Cocaine abuse ([place])   ? Depression   ? Gout   ? Hep C w/o coma, chronic [facility]) diagnosed May 2016  ? History of radiation therapy   ? left hip 06/08/2021-06/20/2021  Dr Gery Pray  ? 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 ? ?Past Surgical History:  ?Procedure Laterality Date  ? CORONARY STENT INTERVENTION N/A 12/21/2020  ? Procedure: CORONARY STENT INTERVENTION;  Surgeon: Troy Sine, MD;  Location: [place] [facility];  Service: Cardiovascular;  Laterality: N/A;  ?  CORONARY STENT INTERVENTION N/A 12/22/2020  ? Procedure: CORONARY STENT INTERVENTION;  Surgeon: Burnell Blanks, MD;  Location: [place] [facility];  Service: Cardiovascular;  Laterality: N/A;  ? IR 3D INDEPENDENT WKST  02/15/2021  ? IR ANGIOGRAM SELECTIVE EACH ADDITIONAL VESSEL  02/15/2021  ? IR ANGIOGRAM SELECTIVE EACH ADDITIONAL VESSEL  02/15/2021  ? IR ANGIOGRAM SELECTIVE EACH ADDITIONAL VESSEL  06/26/2021  ? IR ANGIOGRAM SELECTIVE EACH ADDITIONAL VESSEL  06/28/2021  ? IR ANGIOGRAM VISCERAL SELECTIVE  02/15/2021  ? IR ANGIOGRAM VISCERAL SELECTIVE  02/15/2021  ? IR ANGIOGRAM VISCERAL SELECTIVE  06/26/2021  ? IR EMBO TUMOR ORGAN ISCHEMIA INFARCT INC GUIDE ROADMAPPING  06/26/2021  ? IR RADIOLOGIST EVAL & MGMT  01/11/2021  ? IR RADIOLOGIST EVAL & MGMT  07/27/2021  ? IR US GUIDE VASC ACCESS RIGHT  02/15/2021  ? IR US GUIDE VASC ACCESS RIGHT  06/26/2021  ? LEFT HEART CATH AND CORONARY ANGIOGRAPHY N/A 12/21/2020  ? Procedure: LEFT HEART CATH AND CORONARY ANGIOGRAPHY;  Surgeon: Troy Sine, MD;  Location: [place] [facility];  Service: Cardiovascular;  Laterality: N/A;  ? ? ?Current Outpatient Medications  ?Medication Sig Dispense Refill  ? allopurinol (ZYLOPRIM) 100 MG tablet TAKE 1 TABLET BY MOUTH EVERY DAY 90 tablet 1  ? amLODipine (NORVASC) 5 MG tablet Take 1 tablet (5 mg total) by mouth daily. 90 tablet 3  ? aspirin 81 MG chewable tablet  Chew 1 tablet (81 mg total) by mouth daily. 90 tablet 3  ? nitroGLYCERIN (NITROSTAT) 0.4 MG SL tablet Place 1 tablet (0.4 mg total) under the tongue every 5 (five) minutes as needed for chest pain. 25 tablet 12  ? oxyCODONE (OXY IR/ROXICODONE) 5 MG immediate release tablet Take 1 tablet (5 mg total) by mouth every 6 (six) hours as needed for severe pain. (Patient taking differently: Take 5 mg by mouth in the morning, at noon, in the evening, and at bedtime.) 60 tablet 0  ? predniSONE (DELTASONE) 5 MG tablet Take 1 tablet (5 mg total) by mouth 2 (two) times daily with a meal. 60 tablet 2   ? rosuvastatin (CRESTOR) 20 MG tablet Take 1 tablet (20 mg total) by mouth daily. 90 tablet 3  ? tamsulosin (FLOMAX) 0.4 MG CAPS capsule Take 1 capsule (0.4 mg total) by mouth daily. 90 capsule 0  ? ticagrelor (BRILINTA) 90 MG TABS tablet Take 1 tablet (90 mg total) by mouth 2 (two) times daily. 120 tablet 3  ? isosorbide mononitrate (IMDUR) 30 MG 24 hr tablet Take 0.5 tablets (15 mg total) by mouth daily. (Patient not taking: Reported on 08/07/2021) 45 tablet 3  ? ?Current Facility-Administered Medications  ?Medication Dose Route Frequency Provider Last Rate Last Admin  ? sodium chloride flush (NS) 0.9 % injection 3 mL  3 mL Intravenous Q12H [name], MD      ? ? ?No Known Allergies ? ?Social History  ? ?Socioeconomic History  ? Marital status: Single  ?  Spouse name: Not on file  ? Number of children: Not on file  ? Years of education: Not on file  ? Highest education level: Not on file  ?Occupational History  ? Not on file  ?Tobacco Use  ? Smoking status: Every Day  ?  Packs/day: 1.00  ?  Years: 25.00  ?  Pack years: 25.00  ?  Types: Cigarettes  ? Smokeless tobacco: Never  ?Substance and Sexual Activity  ? Alcohol use: Not Currently  ?  Alcohol/week: 126.0 standard drinks  ?  Types: 126 Cans of beer per week  ?  Comment: 18 pack per day  ? Drug use: Yes  ?  Types: Cocaine, IV, Heroin, Marijuana  ?  Comment: Used crack and cocaine 04/02/16  ? Sexual activity: Not Currently  ?Other Topics Concern  ? Not on file  ?Social History Narrative  ? Not on file  ? ?Social Determinants of Health  ? ?Financial Resource Strain: Not on file  ?Food Insecurity: Not on file  ?Transportation Needs: Not on file  ?Physical Activity: Not on file  ?Stress: Not on file  ?Social Connections: Not on file  ?Intimate Partner Violence: Not on file  ? ? ?Family History  ?Problem Relation Age of Onset  ? Cancer Mother   ? Cancer Father   ? ? ?Review of Systems:  As stated in the HPI and otherwise negative.  ? ?BP 126/76   Pulse 85    Ht 5' 2" (1.575 m)   Wt 158 lb 6.4 oz (71.8 kg)   SpO2 99%   BMI 28.97 kg/m?  ? ?Physical Examination: ?General: Well developed, well nourished, NAD  ?HEENT: OP clear, mucus membranes moist  ?SKIN: warm, dry. No rashes. ?Neuro: No focal deficits  ?Musculoskeletal: Muscle strength 5/5 all ext  ?Psychiatric: Mood and affect normal  ?Neck: No JVD, no carotid bruits, no thyromegaly, no lymphadenopathy.  ?Lungs:Clear bilaterally, no wheezes, rhonci, crackles ?Cardiovascular: Regular  rate and rhythm. No murmurs, gallops or rubs. ?Abdomen:Soft. Bowel sounds present. Non-tender.  ?Extremities: No lower extremity edema. Pulses are 2 + in the bilateral DP/PT. ? ?EKG:  EKG is ordered today. ?The ekg ordered today demonstrates Sinus, LVH, QTc 490 msec ? ?Recent Labs: ?12/19/2020: Magnesium 2.0 ?08/02/2021: ALT 19; BUN 10; Creatinine 0.62; Hemoglobin 11.4; Platelet Count 124; Potassium 3.7; Sodium 135  ? ?Lipid Panel ?   ?Component Value Date/Time  ? CHOL 201 (H) 12/19/2020 1648  ? CHOL 196 12/19/2017 1835  ? TRIG 248 (H) 12/19/2020 1648  ? HDL 36 (L) 12/19/2020 1648  ? HDL 29 (L) 12/19/2017 1835  ? CHOLHDL 5.6 12/19/2020 1648  ? VLDL 50 (H) 12/19/2020 1648  ? LDLCALC 115 (H) 12/19/2020 1648  ? LDLCALC Comment 12/19/2017 1835  ? LDLDIRECT 94 02/27/2018 1919  ? ?  ?Wt Readings from Last 3 Encounters:  ?08/07/21 158 lb 6.4 oz (71.8 kg)  ?08/02/21 158 lb 6.4 oz (71.8 kg)  ?07/27/21 157 lb 3.2 oz (71.3 kg)  ?  ?Assessment and Plan:  ? ?1. CAD with unstable angina: He is having chest pain worrisome for unstable angina. Last cath in September 2022 with severe disease in the LAD and RCA, both treated with drug eluting stents. Cardiac cath is indicated. With plan cardiac cath tomorrow at [facility]. This has already been scheduled by Dr. Nishan.  ? ? I have reviewed the risks, indications, and alternatives to cardiac catheterization, possible angioplasty, and stenting with the patient. Risks include but are not limited to bleeding, infection,  vascular injury, stroke, myocardial infection, arrhythmia, kidney injury, radiation-related injury in the case of prolonged fluoroscopy use, emergency cardiac surgery, and death. The patient understands the ris

## 2021-08-08 ENCOUNTER — Encounter (HOSPITAL_COMMUNITY)
Admission: RE | Disposition: A | Discharge status: Home / Self Care | Payer: Medicaid Other | Source: Home / Self Care | Attending: Cardiovascular Disease

## 2021-08-08 ENCOUNTER — Other Ambulatory Visit: Payer: Self-pay

## 2021-08-08 ENCOUNTER — Ambulatory Visit (HOSPITAL_COMMUNITY)
Admission: RE | Admit: 2021-08-08 | Discharge: 2021-08-08 | Disposition: A | Discharge status: Home / Self Care | Payer: Medicaid Other | Attending: Cardiovascular Disease | Admitting: Cardiovascular Disease

## 2021-08-08 DIAGNOSIS — Z923 Personal history of irradiation: Secondary | ICD-10-CM | POA: Diagnosis not present

## 2021-08-08 DIAGNOSIS — Z7982 Long term (current) use of aspirin: Secondary | ICD-10-CM | POA: Insufficient documentation

## 2021-08-08 DIAGNOSIS — Z79899 Other long term (current) drug therapy: Secondary | ICD-10-CM | POA: Insufficient documentation

## 2021-08-08 DIAGNOSIS — I25118 Atherosclerotic heart disease of native coronary artery with other forms of angina pectoris: Secondary | ICD-10-CM

## 2021-08-08 DIAGNOSIS — I10 Essential (primary) hypertension: Secondary | ICD-10-CM | POA: Diagnosis not present

## 2021-08-08 DIAGNOSIS — Z955 Presence of coronary angioplasty implant and graft: Secondary | ICD-10-CM | POA: Diagnosis not present

## 2021-08-08 DIAGNOSIS — I25110 Atherosclerotic heart disease of native coronary artery with unstable angina pectoris: Secondary | ICD-10-CM | POA: Diagnosis present

## 2021-08-08 DIAGNOSIS — F32A Depression, unspecified: Secondary | ICD-10-CM | POA: Insufficient documentation

## 2021-08-08 DIAGNOSIS — C61 Malignant neoplasm of prostate: Secondary | ICD-10-CM | POA: Diagnosis not present

## 2021-08-08 DIAGNOSIS — I209 Angina pectoris, unspecified: Secondary | ICD-10-CM

## 2021-08-08 DIAGNOSIS — F17210 Nicotine dependence, cigarettes, uncomplicated: Secondary | ICD-10-CM | POA: Insufficient documentation

## 2021-08-08 HISTORY — PX: LEFT HEART CATH AND CORONARY ANGIOGRAPHY: CATH118249

## 2021-08-08 SURGERY — LEFT HEART CATH AND CORONARY ANGIOGRAPHY
Anesthesia: LOCAL

## 2021-08-08 MED ORDER — HEPARIN SODIUM (PORCINE) 1000 UNIT/ML IJ SOLN
INTRAMUSCULAR | Status: AC
  Filled 2021-08-08: qty 10

## 2021-08-08 MED ORDER — VERAPAMIL HCL 2.5 MG/ML IV SOLN
INTRAVENOUS | Status: AC
  Filled 2021-08-08: qty 2

## 2021-08-08 MED ORDER — HEPARIN (PORCINE) IN NACL 1000-0.9 UT/500ML-% IV SOLN
INTRAVENOUS | Status: DC | PRN
  Administered 2021-08-08 (×2): 500 mL

## 2021-08-08 MED ORDER — HYDRALAZINE HCL 20 MG/ML IJ SOLN: 10.0000 mg | INTRAMUSCULAR | Status: DC | PRN

## 2021-08-08 MED ORDER — MIDAZOLAM HCL 2 MG/2ML IJ SOLN
INTRAMUSCULAR | Status: AC
  Filled 2021-08-08: qty 2

## 2021-08-08 MED ORDER — MIDAZOLAM HCL 2 MG/2ML IJ SOLN
INTRAMUSCULAR | Status: DC | PRN
  Administered 2021-08-08: 2 mg via INTRAVENOUS

## 2021-08-08 MED ORDER — SODIUM CHLORIDE 0.9% FLUSH: 3.0000 mL | INTRAVENOUS | Status: DC | PRN

## 2021-08-08 MED ORDER — SODIUM CHLORIDE 0.9 % IV SOLN: 250.0000 mL | INTRAVENOUS | Status: DC | PRN

## 2021-08-08 MED ORDER — SODIUM CHLORIDE 0.9 % IV SOLN: INTRAVENOUS | Status: AC

## 2021-08-08 MED ORDER — SODIUM CHLORIDE 0.9 % WEIGHT BASED INFUSION
3.0000 mL/kg/h | INTRAVENOUS | Status: AC
  Administered 2021-08-08: 3 mL/kg/h via INTRAVENOUS

## 2021-08-08 MED ORDER — FENTANYL CITRATE (PF) 100 MCG/2ML IJ SOLN
INTRAMUSCULAR | Status: DC | PRN
  Administered 2021-08-08: 50 ug via INTRAVENOUS

## 2021-08-08 MED ORDER — VERAPAMIL HCL 2.5 MG/ML IV SOLN
INTRAVENOUS | Status: DC | PRN
  Administered 2021-08-08: 10 mL via INTRA_ARTERIAL

## 2021-08-08 MED ORDER — IOHEXOL 350 MG/ML SOLN
INTRAVENOUS | Status: DC | PRN
  Administered 2021-08-08: 80 mL

## 2021-08-08 MED ORDER — HEPARIN (PORCINE) IN NACL 1000-0.9 UT/500ML-% IV SOLN
INTRAVENOUS | Status: AC
  Filled 2021-08-08: qty 500

## 2021-08-08 MED ORDER — SODIUM CHLORIDE 0.9% FLUSH
3.0000 mL | INTRAVENOUS | Status: DC | PRN
Start: 2021-08-08 — End: 2021-08-08

## 2021-08-08 MED ORDER — LIDOCAINE HCL (PF) 1 % IJ SOLN
INTRAMUSCULAR | Status: DC | PRN
  Administered 2021-08-08: 2 mL

## 2021-08-08 MED ORDER — LABETALOL HCL 5 MG/ML IV SOLN: 10.0000 mg | INTRAVENOUS | Status: DC | PRN

## 2021-08-08 MED ORDER — ASPIRIN 81 MG PO CHEW: 81.0000 mg | CHEWABLE_TABLET | ORAL | Status: DC

## 2021-08-08 MED ORDER — LIDOCAINE HCL (PF) 1 % IJ SOLN
INTRAMUSCULAR | Status: AC
  Filled 2021-08-08: qty 30

## 2021-08-08 MED ORDER — ONDANSETRON HCL 4 MG/2ML IJ SOLN: 4.0000 mg | Freq: Four times a day (QID) | INTRAMUSCULAR | Status: DC | PRN

## 2021-08-08 MED ORDER — HEPARIN SODIUM (PORCINE) 1000 UNIT/ML IJ SOLN
INTRAMUSCULAR | Status: DC | PRN
  Administered 2021-08-08: 4000 [IU] via INTRAVENOUS

## 2021-08-08 MED ORDER — ACETAMINOPHEN 325 MG PO TABS: 650.0000 mg | ORAL_TABLET | ORAL | Status: DC | PRN

## 2021-08-08 MED ORDER — SODIUM CHLORIDE 0.9% FLUSH: 3.0000 mL | Freq: Two times a day (BID) | INTRAVENOUS | Status: DC

## 2021-08-08 MED ORDER — FENTANYL CITRATE (PF) 100 MCG/2ML IJ SOLN
INTRAMUSCULAR | Status: AC
  Filled 2021-08-08: qty 2

## 2021-08-08 MED ORDER — SODIUM CHLORIDE 0.9 % WEIGHT BASED INFUSION: 1.0000 mL/kg/h | INTRAVENOUS | Status: DC

## 2021-08-08 SURGICAL SUPPLY — 9 items

## 2021-08-08 NOTE — Progress Notes (Signed)
Dr Angelena Form in to see pt-advised pt of delay-pt requests sips of water-MD approved ?

## 2021-08-08 NOTE — Interval H&P Note (Signed)
History and Physical Interval Note: ? ?08/08/2021 ?10:03 AM ? ?Edgar Mooney  has presented today for surgery, with the diagnosis of chest pain.  The various methods of treatment have been discussed with the patient and family. After consideration of risks, benefits and other options for treatment, the patient has consented to  Procedure(s): ?LEFT HEART CATH AND CORONARY ANGIOGRAPHY (N/A) as a surgical intervention.  The patient's history has been reviewed, patient examined, no change in status, stable for surgery.  I have reviewed the patient's chart and labs.  Questions were answered to the patient's satisfaction.   ? ?Cath Lab Visit (complete for each Cath Lab visit) ? ?Clinical Evaluation Leading to the Procedure:  ? ?ACS: No. ? ?Non-ACS:   ? ?Anginal Classification: CCS III ? ?Anti-ischemic medical therapy: Maximal Therapy (2 or more classes of medications) ? ?Non-Invasive Test Results: No non-invasive testing performed ? ?Prior CABG: No previous CABG ? ? ? ? ? ? ? ?Edgar Mooney ? ? ?

## 2021-08-09 ENCOUNTER — Encounter (HOSPITAL_COMMUNITY): Payer: Self-pay | Admitting: Cardiovascular Disease

## 2021-08-11 ENCOUNTER — Telehealth: Payer: Self-pay

## 2021-08-11 NOTE — Telephone Encounter (Signed)
Patient called in and request a refill of his pain medication. I let the patient know it's too early and the provider will refill his pain medication on 08/14/21 ?

## 2021-08-14 ENCOUNTER — Other Ambulatory Visit: Payer: Self-pay | Admitting: Nurse Practitioner

## 2021-08-14 ENCOUNTER — Telehealth: Payer: Self-pay

## 2021-08-14 DIAGNOSIS — C61 Malignant neoplasm of prostate: Secondary | ICD-10-CM

## 2021-08-14 MED ORDER — OXYCODONE HCL 5 MG PO TABS: 5.0000 mg | ORAL_TABLET | Freq: Four times a day (QID) | ORAL | 0 refills | Status: DC | PRN

## 2021-08-14 NOTE — Telephone Encounter (Signed)
Patient called in and requested a refill of his OxyCodone, the request was placed on the Np's desk ?

## 2021-08-19 ENCOUNTER — Other Ambulatory Visit: Payer: Self-pay | Admitting: Oncology

## 2021-08-21 ENCOUNTER — Encounter: Payer: Self-pay | Admitting: Oncology

## 2021-08-21 NOTE — Progress Notes (Deleted)
? ? ?Office Visit  ?  ?Patient Name: Edgar Mooney ?Date of Encounter: 08/21/2021 ? ?Primary Care Provider:  Patient, No Pcp Per (Inactive) ?Primary Cardiologist:  None ?Primary Electrophysiologist: None ? ?Patient Profile  ?  ?Chief Complaint: Post hospital follow-up CAD with unstable angina ? ? Notable History: ?HTN ?CAD s/p DES to 99% proximal LAD, and proximal and mid RCA  ?Hepatitis C ?Prostate CA Metastatic ?Tobacco abuse ?Polysubstance abuse ? Recent Studies: ?2D echo ? ?History of Present Illness  ?  ?Edgar Mooney is a 56 y.o. male with PMH of CAD,HTN, Polysubstance abuse,Prostate CA, hepatitis C. He who was admitted 9/22 with unstable angina.  Left heart cath completed with severe 99% LAD and RCA, both treated with drug eluting stents. Echo 12/19/20 with LVEF=50-55%.   ? ?Patient contacted office on 08/07/21 with complaint of chest pain. Cardiac cath was scheduled in 08/08/21 and reveled mid LAD lesion 50% stenosis, ramus lesion 50% stenosed, proximal circumflex lesion 50% stenosis.  Patient started on Imdur and advised to stop illicit vasoconstricting substances. GDMT with Brilinta, ASA, and Crestor. ? ?Since last being seen in the office patient reports***.  Patient denies chest pain, palpitations, dyspnea, PND, orthopnea, nausea, vomiting, dizziness, syncope, edema, weight gain, or early satiety. ? ?Note: ?Three month follow up ?Medication compliance ?Anginal Symptoms ? ?Past Medical History  ?  ?Past Medical History:  ?Diagnosis Date  ? Alcohol abuse   ? Cocaine abuse ([place])   ? Depression   ? Gout   ? Hep C w/o coma, chronic [facility]) diagnosed May 2016  ? History of radiation therapy   ? left hip 06/08/2021-06/20/2021  Dr Gery Pray  ? 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 ?Past Surgical History:  ?Procedure Laterality Date  ? CORONARY STENT INTERVENTION N/A 12/21/2020  ? Procedure: CORONARY STENT INTERVENTION;  Surgeon: Troy Sine, MD;  Location: [place] [facility];  Service: Cardiovascular;   Laterality: N/A;  ? CORONARY STENT INTERVENTION N/A 12/22/2020  ? Procedure: CORONARY STENT INTERVENTION;  Surgeon: Burnell Blanks, MD;  Location: [place] [facility];  Service: Cardiovascular;  Laterality: N/A;  ? IR 3D INDEPENDENT WKST  02/15/2021  ? IR ANGIOGRAM SELECTIVE EACH ADDITIONAL VESSEL  02/15/2021  ? IR ANGIOGRAM SELECTIVE EACH ADDITIONAL VESSEL  02/15/2021  ? IR ANGIOGRAM SELECTIVE EACH ADDITIONAL VESSEL  06/26/2021  ? IR ANGIOGRAM SELECTIVE EACH ADDITIONAL VESSEL  06/28/2021  ? IR ANGIOGRAM VISCERAL SELECTIVE  02/15/2021  ? IR ANGIOGRAM VISCERAL SELECTIVE  02/15/2021  ? IR ANGIOGRAM VISCERAL SELECTIVE  06/26/2021  ? IR EMBO TUMOR ORGAN ISCHEMIA INFARCT INC GUIDE ROADMAPPING  06/26/2021  ? IR RADIOLOGIST EVAL & MGMT  01/11/2021  ? IR RADIOLOGIST EVAL & MGMT  07/27/2021  ? IR US GUIDE VASC ACCESS RIGHT  02/15/2021  ? IR US GUIDE VASC ACCESS RIGHT  06/26/2021  ? LEFT HEART CATH AND CORONARY ANGIOGRAPHY N/A 12/21/2020  ? Procedure: LEFT HEART CATH AND CORONARY ANGIOGRAPHY;  Surgeon: Troy Sine, MD;  Location: [place] [facility];  Service: Cardiovascular;  Laterality: N/A;  ? LEFT HEART CATH AND CORONARY ANGIOGRAPHY N/A 08/08/2021  ? Procedure: LEFT HEART CATH AND CORONARY ANGIOGRAPHY;  Surgeon: Burnell Blanks, MD;  Location: [facility];  Service: Cardiovascular;  Laterality: N/A;  ? ? ?Allergies ? ?No Known Allergies ? ?Home Medications  ?  ?Current Outpatient Medications  ?Medication Sig Dispense Refill  ? allopurinol (ZYLOPRIM) 100 MG tablet TAKE 1 TABLET BY MOUTH EVERY DAY 90 tablet  1  ? amLODipine (NORVASC) 5 MG tablet Take 1 tablet (5 mg total) by mouth daily. 90 tablet 3  ? aspirin 81 MG chewable tablet Chew 1 tablet (81 mg total) by mouth daily. 90 tablet 3  ? isosorbide mononitrate (IMDUR) 30 MG 24 hr tablet Take 0.5 tablets (15 mg total) by mouth daily. (Patient not taking: Reported on 08/07/2021) 45 tablet 3  ? nitroGLYCERIN (NITROSTAT) 0.4 MG SL tablet Place 1 tablet (0.4 mg total)  under the tongue every 5 (five) minutes as needed for chest pain. 25 tablet 12  ? oxyCODONE (OXY IR/ROXICODONE) 5 MG immediate release tablet Take 1 tablet (5 mg total) by mouth every 6 (six) hours as needed for severe pain. 60 tablet 0  ? predniSONE (DELTASONE) 5 MG tablet Take 1 tablet (5 mg total) by mouth 2 (two) times daily with a meal. 60 tablet 2  ? rosuvastatin (CRESTOR) 20 MG tablet Take 1 tablet (20 mg total) by mouth daily. 90 tablet 3  ? tamsulosin (FLOMAX) 0.4 MG CAPS capsule Take 1 capsule (0.4 mg total) by mouth daily. 90 capsule 0  ? ticagrelor (BRILINTA) 90 MG TABS tablet Take 1 tablet (90 mg total) by mouth 2 (two) times daily. 120 tablet 3  ? ?Current Facility-Administered Medications  ?Medication Dose Route Frequency Provider Last Rate Last Admin  ? sodium chloride flush (NS) 0.9 % injection 3 mL  3 mL Intravenous Q12H [name], MD      ?  ? ?Review of Systems  ?Please see the history of present illness.    ?(+)*** ?(+)*** ? ?All other systems reviewed and are otherwise negative except as noted above. ? ?Physical Exam  ?  ?Wt Readings from Last 3 Encounters:  ?08/08/21 158 lb (71.7 kg)  ?08/07/21 158 lb 6.4 oz (71.8 kg)  ?08/02/21 158 lb 6.4 oz (71.8 kg)  ? ?ZD:GLOVF were no vitals filed for this visit.,There is no height or weight on file to calculate BMI. ? ?Constitutional:   ?   Appearance: Healthy appearance. Not in distress.  ?Neck:  ?   Vascular: JVD normal.  ?Pulmonary:  ?   Effort: Pulmonary effort is normal.  ?   Breath sounds: No wheezing. No rales. Diminished in the bases ?Cardiovascular:  ?   Normal rate. Regular rhythm. Normal S1. Normal S2.   ?   Murmurs: There is no murmur.  ?Edema: ?   Peripheral edema absent.  ?Abdominal:  ?   Palpations: Abdomen is soft non tender. There is no hepatomegaly.  ?Skin: ?   General: Skin is warm and dry.  ?Neurological:  ?   General: No focal deficit present.  ?   Mental Status: Alert and oriented to person, place and time.  ?   Cranial  Nerves: Cranial nerves are intact.  ?EKG/LABS/Other Studies Reviewed  ?  ?ECG personally reviewed by me today - *** ? ?Lab Results  ?Component Value Date  ? WBC 7.9 08/02/2021  ? HGB 11.4 (L) 08/02/2021  ? HCT 32.6 (L) 08/02/2021  ? MCV 90.6 08/02/2021  ? PLT 124 (L) 08/02/2021  ? ?Lab Results  ?Component Value Date  ? CREATININE 0.62 08/02/2021  ? BUN 10 08/02/2021  ? NA 135 08/02/2021  ? K 3.7 08/02/2021  ? CL 105 08/02/2021  ? CO2 21 (L) 08/02/2021  ? ?Lab Results  ?Component Value Date  ? ALT 19 08/02/2021  ? AST 22 08/02/2021  ? ALKPHOS 131 (H) 08/02/2021  ? BILITOT 1.4 (H) 08/02/2021  ? ?  Lab Results  ?Component Value Date  ? CHOL 201 (H) 12/19/2020  ? HDL 36 (L) 12/19/2020  ? LDLCALC 115 (H) 12/19/2020  ? LDLDIRECT 94 02/27/2018  ? TRIG 248 (H) 12/19/2020  ? CHOLHDL 5.6 12/19/2020  ?  ?Lab Results  ?Component Value Date  ? HGBA1C 5.1 12/19/2020  ? ? ?Assessment & Plan  ?  ?1.  Coronary artery disease: ?-s/p DES to 99% proximal LAD, and proximal and mid RCA.  Patient currently on DAPT with Brilinta and aspirin. ?-Repeat cath completed on 4/25 for complaint of unstable angina with no further intervention. ?-No beta-blockers due to ongoing cocaine use ?-Current GDMT consist of ASA 81 mg daily, Crestor 20 mg daily ?-Patient denies***today ?-Continue Imdur 15 mg twice daily ? ?2.  Hyperlipidemia: ?-Continue Crestor 20 mg daily ?-Last lipids completed 12/2020 with LDL of 115, goal of less than 70 ? ?3.  Hypertension: ?-Blood pressure today was*** ?-Continue Norvasc 5 mg daily ? ? ?4.  Polysubstance abuse: ?-Patient advised of the dangers of cocaine use and CAD*** ? ?5. Prostate Cancer: ?-Being followed by oncology and currently undergoing chemotherapy ? ?Disposition: Follow-up with None or APP in *** months ?{Are you ordering a CV Procedure (e.g. stress test, cath, DCCV, TEE, etc)?   Press F2        :[id number]}  ? ?Medication Adjustments/Labs and Tests Ordered: ?Current medicines are reviewed at length with the  patient today.  Concerns regarding medicines are outlined above.  ? ? ?Signed, ?Mable Fill, Marissa Nestle, NP ?08/21/2021, 4:06 PM ?[place] ?

## 2021-08-22 ENCOUNTER — Encounter: Payer: Self-pay | Admitting: Oncology

## 2021-08-22 ENCOUNTER — Other Ambulatory Visit: Payer: Self-pay | Admitting: Interventional Radiology

## 2021-08-22 ENCOUNTER — Inpatient Hospital Stay (HOSPITAL_BASED_OUTPATIENT_CLINIC_OR_DEPARTMENT_OTHER): Payer: BC Managed Care – PPO | Admitting: Oncology

## 2021-08-22 ENCOUNTER — Encounter: Payer: Self-pay | Admitting: *Deleted

## 2021-08-22 ENCOUNTER — Other Ambulatory Visit: Payer: Self-pay | Admitting: *Deleted

## 2021-08-22 ENCOUNTER — Inpatient Hospital Stay: Payer: BC Managed Care – PPO | Attending: Nurse Practitioner

## 2021-08-22 ENCOUNTER — Other Ambulatory Visit (HOSPITAL_COMMUNITY): Payer: Self-pay | Admitting: Interventional Radiology

## 2021-08-22 ENCOUNTER — Ambulatory Visit (INDEPENDENT_AMBULATORY_CARE_PROVIDER_SITE_OTHER): Payer: Medicaid Other | Admitting: Nurse Practitioner

## 2021-08-22 ENCOUNTER — Inpatient Hospital Stay: Payer: BC Managed Care – PPO

## 2021-08-22 VITALS — BP 125/80 | HR 80 | Temp 98.2°F | Resp 18 | Ht 62.0 in | Wt 158.0 lb

## 2021-08-22 VITALS — BP 149/73 | HR 74

## 2021-08-22 DIAGNOSIS — Z5111 Encounter for antineoplastic chemotherapy: Secondary | ICD-10-CM | POA: Insufficient documentation

## 2021-08-22 DIAGNOSIS — D696 Thrombocytopenia, unspecified: Secondary | ICD-10-CM | POA: Insufficient documentation

## 2021-08-22 DIAGNOSIS — C61 Malignant neoplasm of prostate: Secondary | ICD-10-CM

## 2021-08-22 DIAGNOSIS — D649 Anemia, unspecified: Secondary | ICD-10-CM | POA: Diagnosis not present

## 2021-08-22 DIAGNOSIS — C220 Liver cell carcinoma: Secondary | ICD-10-CM

## 2021-08-22 DIAGNOSIS — E782 Mixed hyperlipidemia: Secondary | ICD-10-CM

## 2021-08-22 DIAGNOSIS — I25110 Atherosclerotic heart disease of native coronary artery with unstable angina pectoris: Secondary | ICD-10-CM

## 2021-08-22 DIAGNOSIS — I10 Essential (primary) hypertension: Secondary | ICD-10-CM

## 2021-08-22 DIAGNOSIS — F1910 Other psychoactive substance abuse, uncomplicated: Secondary | ICD-10-CM

## 2021-08-22 DIAGNOSIS — C7951 Secondary malignant neoplasm of bone: Secondary | ICD-10-CM | POA: Diagnosis not present

## 2021-08-22 LAB — CBC WITH DIFFERENTIAL (CANCER CENTER ONLY)
Abs Immature Granulocytes: 0.06 10*3/uL (ref 0.00–0.07)
Basophils Absolute: 0.1 10*3/uL (ref 0.0–0.1)
Basophils Relative: 1 %
Eosinophils Absolute: 0 10*3/uL (ref 0.0–0.5)
Eosinophils Relative: 0 %
HCT: 31.8 % — ABNORMAL LOW (ref 39.0–52.0)
Hemoglobin: 10.8 g/dL — ABNORMAL LOW (ref 13.0–17.0)
Immature Granulocytes: 1 %
Lymphocytes Relative: 6 %
Lymphs Abs: 0.5 10*3/uL — ABNORMAL LOW (ref 0.7–4.0)
MCH: 31.2 pg (ref 26.0–34.0)
MCHC: 34 g/dL (ref 30.0–36.0)
MCV: 91.9 fL (ref 80.0–100.0)
Monocytes Absolute: 0.6 10*3/uL (ref 0.1–1.0)
Monocytes Relative: 7 %
Neutro Abs: 6.9 10*3/uL (ref 1.7–7.7)
Neutrophils Relative %: 85 %
Platelet Count: 102 10*3/uL — ABNORMAL LOW (ref 150–400)
RBC: 3.46 MIL/uL — ABNORMAL LOW (ref 4.22–5.81)
RDW: 16 % — ABNORMAL HIGH (ref 11.5–15.5)
WBC Count: 8.2 10*3/uL (ref 4.0–10.5)
nRBC: 0.2 % (ref 0.0–0.2)

## 2021-08-22 LAB — CMP (CANCER CENTER ONLY)
ALT: 14 U/L (ref 0–44)
AST: 16 U/L (ref 15–41)
Albumin: 4.2 g/dL (ref 3.5–5.0)
Alkaline Phosphatase: 147 U/L — ABNORMAL HIGH (ref 38–126)
Anion gap: 10 (ref 5–15)
BUN: 15 mg/dL (ref 6–20)
CO2: 20 mmol/L — ABNORMAL LOW (ref 22–32)
Calcium: 9.3 mg/dL (ref 8.9–10.3)
Chloride: 109 mmol/L (ref 98–111)
Creatinine: 0.64 mg/dL (ref 0.61–1.24)
GFR, Estimated: >60 mL/min (ref >60)
Glucose, Bld: 104 mg/dL — ABNORMAL HIGH (ref 70–99)
Potassium: 4 mmol/L (ref 3.5–5.1)
Sodium: 139 mmol/L (ref 135–145)
Total Bilirubin: 0.7 mg/dL (ref 0.3–1.2)
Total Protein: 6.5 g/dL (ref 6.5–8.1)

## 2021-08-22 MED ORDER — SODIUM CHLORIDE 0.9 % IV SOLN: Freq: Once | INTRAVENOUS | Status: AC

## 2021-08-22 MED ORDER — SODIUM CHLORIDE 0.9 % IV SOLN
60.0000 mg/m2 | Freq: Once | INTRAVENOUS | Status: AC
  Administered 2021-08-22: 100 mg via INTRAVENOUS
  Filled 2021-08-22: qty 10

## 2021-08-22 MED ORDER — SODIUM CHLORIDE 0.9 % IV SOLN
10.0000 mg | Freq: Once | INTRAVENOUS | Status: AC
  Administered 2021-08-22: 10 mg via INTRAVENOUS
  Filled 2021-08-22: qty 10

## 2021-08-22 NOTE — Progress Notes (Signed)
Patient seen by Dr. Sherrill today ? ?Vitals are within treatment parameters. ? ?Labs reviewed by Dr. Sherrill and are within treatment parameters. ? ?Per physician team, patient is ready for treatment and there are NO modifications to the treatment plan.  ?

## 2021-08-22 NOTE — Progress Notes (Signed)
?[facility] ?OFFICE PROGRESS NOTE ? ? ?Diagnosis: Prostate cancer, hepatocellular carcinoma ? ?INTERVAL HISTORY:  ? ?Edgar Mooney completed another cycle of docetaxel on 08/02/2021.  He received Lupron 08/02/2021.  No nausea, epiphora, or neuropathy symptoms.  Hip pain and right upper quadrant pain have improved.  He underwent a cardiac catheterization 08/08/2021.  The stent is patent.  Medical management is recommended. ? ? ?Objective: ? ?Vital signs in last 24 hours: ? ?Blood pressure 125/80, pulse 80, temperature 98.2 ?F (36.8 ?C), temperature source Oral, resp. rate 18, height $RemoveBe'5\' 2"'CNmVvBcso$  (1.575 m), weight 158 lb (71.7 kg), SpO2 100 %. ?  ? ?HEENT: No thrush or ulcers ?Resp: Lungs clear bilaterally ?Cardio: Regular rate and rhythm ?GI: No hepatosplenomegaly, nontender ?Vascular: No leg edema  ?Skin: Palms without erythema ? ? ?Lab Results: ? ?Lab Results  ?Component Value Date  ? WBC 8.2 08/22/2021  ? HGB 10.8 (L) 08/22/2021  ? HCT 31.8 (L) 08/22/2021  ? MCV 91.9 08/22/2021  ? PLT 102 (L) 08/22/2021  ? NEUTROABS 6.9 08/22/2021  ? ? ?CMP  ?Lab Results  ?Component Value Date  ? NA 139 08/22/2021  ? K 4.0 08/22/2021  ? CL 109 08/22/2021  ? CO2 20 (L) 08/22/2021  ? GLUCOSE 104 (H) 08/22/2021  ? BUN 15 08/22/2021  ? CREATININE 0.64 08/22/2021  ? CALCIUM 9.3 08/22/2021  ? PROT 6.5 08/22/2021  ? ALBUMIN 4.2 08/22/2021  ? AST 16 08/22/2021  ? ALT 14 08/22/2021  ? ALKPHOS 147 (H) 08/22/2021  ? BILITOT 0.7 08/22/2021  ? GFRNONAA >60 08/22/2021  ? GFRAA 121 12/19/2017  ? ? ? ?Medications: I have reviewed the patient's current medications. ? ? ?Assessment/Plan: ?Metastatic prostate cancer ?- Lytic bone lesions with retroperitoneal adenopathy concerning for metastatic prostate cancer ?-03/03/2020-CTA chest/abdomen/pelvis widespread osseous metastatic disease and lower retroperitoneal adenopathy, pathologic right anterior third rib fracture, probable spinal canal tumor at the level of S1, ?-MRI cervical, thoracic, and  lumbar spine 03/03/2020-diffuse osseous metastatic disease, ventral epidural tumor impinging on right S1 nerve root, extraosseous tumor at the bilateral ilium, asymmetric enhancing material at the right C5-6 canal-right  ?-03/03/2020-PSA 763 ?-Degarelix 03/04/2020 ?-Abiraterone/prednisone 03/14/2020 ?-Every 67-month Lupron 04/01/2020 ?-04/01/2020 PSA 36.5 ?-05/31/2020 PSA 1.2 ?-06/28/2020 PSA 1.0 ?-12/14/2020 PSA 6.4 ?-01/13/2021 PSA 10.6 ?-04/26/2021 PSA  45.2 ?-05/16/2021 PSA 70 ?-Abiraterone/prednisone discontinued 05/16/2021 ?-05/16/2021 left hip x-ray-multifocal sclerotic metastasis throughout the pelvis, confluent involving the left acetabulum.  No acute or pathologic fracture.  Left hip osteoarthritis. ?-Cycle 1 docetaxel 05/31/2021 ?-05/31/2021 PSA 113 ?-Palliative radiation to the left acetabulum 06/08/2021 - 06/20/2021 ?-Cycle 2 docetaxel 06/21/2021 ?-06/21/2021 PSA improved at 68 ?-Cycle 3 docetaxel 07/12/2021 ?-07/12/2021 PSA improved at 52 ?-Cycle 4 docetaxel 08/02/2021 ?-Cycle 5 docetaxel 08/22/2021 ?  ?2.  Severe anemia-likely secondary to metastatic prostate cancer involving the bones ?3.  Mild thrombocytopenia ?4.  Cirrhosis with splenomegaly ?5.  History of hepatitis C ?6.  History of polysubstance abuse ?7.  Depression ?8.  Tobacco dependence ?9.  Right arm weakness, right facial numbness-potentially related to nerve root compromise from metastatic bone lesions ?10.  Pain secondary to #1 ?11.  Extensive bone metastases-every 75-month Zometa starting 04/01/2020 ?12.  Gout-acute flare right wrist 04/01/2020 treated with indomethacin, allopurinol resumed ?13.  Right hepatic lobe mass with elevated AFP concerning for HCC ?MRI abdomen 12/21/2020-hypoenhancing inferior right liver mass, segment 6, occluding or compressing the adjacent right portal vein branch, intrahepatic cholangiocarcinoma favored with differential including hepatocellular carcinoma, Li-Rads M ?Y 90 planning study 11-22 ?Y90 right lobe  of liver 06/26/2021 ?14.   Admission 12/19/2020 with an NSTEMI ?Catheterization 12/21/2020-severe multivessel CAD, 99% proximal LAD stenosis, LAD stent placed ?15. 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  ? ? ? ?Disposition: ?Edgar Mooney has completed 4 cycles of docetaxel.  He has tolerated the chemotherapy well.  The PSA was slightly higher when he was here 3 weeks ago.  We will follow-up on the PSA from today.  He will complete cycle 5 docetaxel today.  He will return for an office visit and chemotherapy in 3 weeks. ? ?Betsy Coder, MD ? ?08/22/2021  ?10:57 AM ? ? ?

## 2021-08-22 NOTE — Patient Instructions (Signed)
Edgar Mooney   ?Discharge Instructions: ?Thank you for choosing [facility] to provide your oncology and hematology care.  ? ?If you have a lab appointment with the [facility], please go directly to the [facility] and check in at the registration area. ?  ?Wear comfortable clothing and clothing appropriate for easy access to any Portacath or PICC line.  ? ?We strive to give you quality time with your provider. You may need to reschedule your appointment if you arrive late (15 or more minutes).  Arriving late affects you and other patients whose appointments are after yours.  Also, if you miss three or more appointments without notifying the office, you may be dismissed from the clinic at the provider?s discretion.    ?  ?For prescription refill requests, have your pharmacy contact our office and allow 72 hours for refills to be completed.   ? ?Today you received the following chemotherapy and/or immunotherapy agents Taxotere    ?  ?To help prevent nausea and vomiting after your treatment, we encourage you to take your nausea medication as directed. ? ?BELOW ARE SYMPTOMS THAT SHOULD BE REPORTED IMMEDIATELY: ?*FEVER GREATER THAN 100.4 F (38 ?C) OR HIGHER ?*CHILLS OR SWEATING ?*NAUSEA AND VOMITING THAT IS NOT CONTROLLED WITH YOUR NAUSEA MEDICATION ?*UNUSUAL SHORTNESS OF BREATH ?*UNUSUAL BRUISING OR BLEEDING ?*URINARY PROBLEMS (pain or burning when urinating, or frequent urination) ?*BOWEL PROBLEMS (unusual diarrhea, constipation, pain near the anus) ?TENDERNESS IN MOUTH AND THROAT WITH OR WITHOUT PRESENCE OF ULCERS (sore throat, sores in mouth, or a toothache) ?UNUSUAL RASH, SWELLING OR PAIN  ?UNUSUAL VAGINAL DISCHARGE OR ITCHING  ? ?Items with * indicate a potential emergency and should be followed up as soon as possible or go to the Emergency Department if any problems should occur. ? ?Please show the CHEMOTHERAPY ALERT CARD or IMMUNOTHERAPY ALERT CARD at check-in to the  Emergency Department and triage nurse. ? ?Should you have questions after your visit or need to cancel or reschedule your appointment, please contact [facility]  Dept: [phone]  and follow the prompts.  Office hours are 8:00 a.m. to 4:30 p.m. Monday - Friday. Please note that voicemails left after 4:00 p.m. may not be returned until the following business day.  We are closed weekends and major holidays. You have access to a nurse at all times for urgent questions. Please call the main number to the clinic Dept: [phone] and follow the prompts. ? ? ?For any non-urgent questions, you may also contact your provider using MyChart. We now offer e-Visits for anyone 85 and older to request care online for non-urgent symptoms. For details visit [email or web address]. ?  ?Also download the MyChart app! Go to the app store, search "MyChart", open the app, select [facility], and log in with your MyChart username and password. ? ?Due to Covid, a mask is required upon entering the hospital/clinic. If you do not have a mask, one will be given to you upon arrival. For doctor visits, patients may have 1 support person aged 51 or older with them. For treatment visits, patients cannot have anyone with them due to current Covid guidelines and our immunocompromised population.  ? ?Docetaxel injection ?What is this medication? ?DOCETAXEL (doe se TAX el) is a chemotherapy drug. It targets fast dividing cells, like cancer cells, and causes these cells to die. This medicine is used to treat many types of cancers like breast cancer, certain stomach cancers, head and neck  cancer, lung cancer, and prostate cancer. ?This medicine may be used for other purposes; ask your health care provider or pharmacist if you have questions. ?COMMON BRAND NAME(S): Docefrez, Taxotere ?What should I tell my care team before I take this medication? ?They need to know if you have any of these conditions: ?infection  (especially a virus infection such as chickenpox, cold sores, or herpes) ?liver disease ?low blood counts, like low white cell, platelet, or red cell counts ?an unusual or allergic reaction to docetaxel, polysorbate 80, other chemotherapy agents, other medicines, foods, dyes, or preservatives ?pregnant or trying to get pregnant ?breast-feeding ?How should I use this medication? ?This drug is given as an infusion into a vein. It is administered in a hospital or clinic by a specially trained health care professional. ?Talk to your pediatrician regarding the use of this medicine in children. Special care may be needed. ?Overdosage: If you think you have taken too much of this medicine contact a poison control center or emergency room at once. ?NOTE: This medicine is only for you. Do not share this medicine with others. ?What if I miss a dose? ?It is important not to miss your dose. Call your doctor or health care professional if you are unable to keep an appointment. ?What may interact with this medication? ?Do not take this medicine with any of the following medications: ?live virus vaccines ?This medicine may also interact with the following medications: ?aprepitant ?certain antibiotics like erythromycin or clarithromycin ?certain antivirals for HIV or hepatitis ?certain medicines for fungal infections like fluconazole, itraconazole, ketoconazole, posaconazole, or voriconazole ?cimetidine ?ciprofloxacin ?conivaptan ?cyclosporine ?dronedarone ?fluvoxamine ?grapefruit juice ?imatinib ?verapamil ?This list may not describe all possible interactions. Give your health care provider a list of all the medicines, herbs, non-prescription drugs, or dietary supplements you use. Also tell them if you smoke, drink alcohol, or use illegal drugs. Some items may interact with your medicine. ?What should I watch for while using this medication? ?Your condition will be monitored carefully while you are receiving this medicine. You  will need important blood work done while you are taking this medicine. ?Call your doctor or health care professional for advice if you get a fever, chills or sore throat, or other symptoms of a cold or flu. Do not treat yourself. This drug decreases your body's ability to fight infections. Try to avoid being around people who are sick. ?Some products may contain alcohol. Ask your health care professional if this medicine contains alcohol. Be sure to tell all health care professionals you are taking this medicine. Certain medicines, like metronidazole and disulfiram, can cause an unpleasant reaction when taken with alcohol. The reaction includes flushing, headache, nausea, vomiting, sweating, and increased thirst. The reaction can last from 30 minutes to several hours. ?You may get drowsy or dizzy. Do not drive, use machinery, or do anything that needs mental alertness until you know how this medicine affects you. Do not stand or sit up quickly, especially if you are an older patient. This reduces the risk of dizzy or fainting spells. Alcohol may interfere with the effect of this medicine. ?Talk to your health care professional about your risk of cancer. You may be more at risk for certain types of cancer if you take this medicine. ?Do not become pregnant while taking this medicine or for 6 months after stopping it. Women should inform their doctor if they wish to become pregnant or think they might be pregnant. There is a potential for serious side effects  to an unborn child. Talk to your health care professional or pharmacist for more information. Do not breast-feed an infant while taking this medicine or for 1 week after stopping it. ?Males who get this medicine must use a condom during sex with females who can get pregnant. If you get a woman pregnant, the baby could have birth defects. The baby could die before they are born. You will need to continue wearing a condom for 3 months after stopping the medicine.  Tell your health care provider right away if your partner becomes pregnant while you are taking this medicine. ?This may interfere with the ability to father a child. You should talk to your doctor or health car

## 2021-08-23 LAB — PROSTATE-SPECIFIC AG, SERUM (LABCORP): Prostate Specific Ag, Serum: 75.5 ng/mL — ABNORMAL HIGH (ref 0.0–4.0)

## 2021-08-23 NOTE — Progress Notes (Signed)
error 

## 2021-08-24 ENCOUNTER — Inpatient Hospital Stay: Payer: BC Managed Care – PPO

## 2021-08-28 ENCOUNTER — Other Ambulatory Visit: Payer: Self-pay | Admitting: Nurse Practitioner

## 2021-08-28 ENCOUNTER — Telehealth: Payer: Self-pay

## 2021-08-28 DIAGNOSIS — C61 Malignant neoplasm of prostate: Secondary | ICD-10-CM

## 2021-08-28 MED ORDER — OXYCODONE HCL 5 MG PO TABS: 5.0000 mg | ORAL_TABLET | Freq: Four times a day (QID) | ORAL | 0 refills | Status: DC | PRN

## 2021-08-28 NOTE — Telephone Encounter (Signed)
Patient called in and requested a refill of his oxy and the request was placed on the NP's desk ?

## 2021-09-11 ENCOUNTER — Other Ambulatory Visit: Payer: Self-pay | Admitting: Oncology

## 2021-09-13 ENCOUNTER — Encounter: Payer: Self-pay | Admitting: Oncology

## 2021-09-13 ENCOUNTER — Inpatient Hospital Stay: Payer: BC Managed Care – PPO

## 2021-09-13 ENCOUNTER — Inpatient Hospital Stay: Payer: BC Managed Care – PPO | Admitting: Oncology

## 2021-09-14 ENCOUNTER — Inpatient Hospital Stay: Payer: BC Managed Care – PPO

## 2021-09-14 ENCOUNTER — Telehealth: Payer: Self-pay

## 2021-09-14 ENCOUNTER — Inpatient Hospital Stay: Payer: BC Managed Care – PPO | Admitting: Oncology

## 2021-09-14 ENCOUNTER — Other Ambulatory Visit (HOSPITAL_BASED_OUTPATIENT_CLINIC_OR_DEPARTMENT_OTHER): Payer: Self-pay

## 2021-09-14 ENCOUNTER — Other Ambulatory Visit: Payer: Self-pay | Admitting: Nurse Practitioner

## 2021-09-14 ENCOUNTER — Other Ambulatory Visit: Payer: Self-pay | Admitting: Oncology

## 2021-09-14 ENCOUNTER — Encounter: Payer: Self-pay | Admitting: Oncology

## 2021-09-14 ENCOUNTER — Inpatient Hospital Stay: Payer: BC Managed Care – PPO | Attending: Nurse Practitioner

## 2021-09-14 DIAGNOSIS — I252 Old myocardial infarction: Secondary | ICD-10-CM | POA: Insufficient documentation

## 2021-09-14 DIAGNOSIS — M109 Gout, unspecified: Secondary | ICD-10-CM | POA: Insufficient documentation

## 2021-09-14 DIAGNOSIS — F32A Depression, unspecified: Secondary | ICD-10-CM | POA: Insufficient documentation

## 2021-09-14 DIAGNOSIS — R200 Anesthesia of skin: Secondary | ICD-10-CM | POA: Insufficient documentation

## 2021-09-14 DIAGNOSIS — C61 Malignant neoplasm of prostate: Secondary | ICD-10-CM

## 2021-09-14 DIAGNOSIS — Z5111 Encounter for antineoplastic chemotherapy: Secondary | ICD-10-CM | POA: Insufficient documentation

## 2021-09-14 DIAGNOSIS — D649 Anemia, unspecified: Secondary | ICD-10-CM | POA: Insufficient documentation

## 2021-09-14 DIAGNOSIS — R162 Hepatomegaly with splenomegaly, not elsewhere classified: Secondary | ICD-10-CM | POA: Insufficient documentation

## 2021-09-14 DIAGNOSIS — F1911 Other psychoactive substance abuse, in remission: Secondary | ICD-10-CM | POA: Insufficient documentation

## 2021-09-14 DIAGNOSIS — Z8619 Personal history of other infectious and parasitic diseases: Secondary | ICD-10-CM | POA: Insufficient documentation

## 2021-09-14 DIAGNOSIS — C7951 Secondary malignant neoplasm of bone: Secondary | ICD-10-CM | POA: Insufficient documentation

## 2021-09-14 DIAGNOSIS — D696 Thrombocytopenia, unspecified: Secondary | ICD-10-CM | POA: Insufficient documentation

## 2021-09-14 DIAGNOSIS — C220 Liver cell carcinoma: Secondary | ICD-10-CM | POA: Insufficient documentation

## 2021-09-14 DIAGNOSIS — K7460 Unspecified cirrhosis of liver: Secondary | ICD-10-CM | POA: Insufficient documentation

## 2021-09-14 DIAGNOSIS — F17200 Nicotine dependence, unspecified, uncomplicated: Secondary | ICD-10-CM | POA: Insufficient documentation

## 2021-09-14 MED ORDER — OXYCODONE HCL 5 MG PO TABS
5.0000 mg | ORAL_TABLET | Freq: Four times a day (QID) | ORAL | 0 refills | Status: DC | PRN
  Filled 2021-09-14: qty 60, 15d supply, fill #0

## 2021-09-14 MED ORDER — OXYCODONE HCL 5 MG PO TABS: 5.0000 mg | ORAL_TABLET | Freq: Four times a day (QID) | ORAL | 0 refills | Status: DC | PRN

## 2021-09-14 NOTE — Telephone Encounter (Signed)
Patient called in and requested a refill of his pain medication, placed the request on the NP's desk.

## 2021-09-15 ENCOUNTER — Inpatient Hospital Stay: Payer: BC Managed Care – PPO

## 2021-09-15 ENCOUNTER — Observation Stay (HOSPITAL_COMMUNITY)
Admission: EM | Admit: 2021-09-15 | Discharge: 2021-09-16 | Disposition: A | Discharge status: Hospice | Payer: BC Managed Care – PPO | Attending: Internal Medicine | Admitting: Internal Medicine

## 2021-09-15 DIAGNOSIS — C7951 Secondary malignant neoplasm of bone: Secondary | ICD-10-CM | POA: Diagnosis not present

## 2021-09-15 DIAGNOSIS — J9811 Atelectasis: Secondary | ICD-10-CM | POA: Diagnosis not present

## 2021-09-15 DIAGNOSIS — C61 Malignant neoplasm of prostate: Secondary | ICD-10-CM | POA: Diagnosis not present

## 2021-09-15 DIAGNOSIS — R079 Chest pain, unspecified: Secondary | ICD-10-CM | POA: Diagnosis not present

## 2021-09-15 DIAGNOSIS — Z20822 Contact with and (suspected) exposure to covid-19: Secondary | ICD-10-CM | POA: Diagnosis not present

## 2021-09-15 DIAGNOSIS — Z515 Encounter for palliative care: Secondary | ICD-10-CM

## 2021-09-15 DIAGNOSIS — F1910 Other psychoactive substance abuse, uncomplicated: Secondary | ICD-10-CM | POA: Diagnosis present

## 2021-09-15 DIAGNOSIS — K7689 Other specified diseases of liver: Secondary | ICD-10-CM | POA: Diagnosis not present

## 2021-09-15 DIAGNOSIS — Z79899 Other long term (current) drug therapy: Secondary | ICD-10-CM | POA: Diagnosis not present

## 2021-09-15 DIAGNOSIS — R091 Pleurisy: Principal | ICD-10-CM | POA: Insufficient documentation

## 2021-09-15 DIAGNOSIS — I2510 Atherosclerotic heart disease of native coronary artery without angina pectoris: Secondary | ICD-10-CM | POA: Insufficient documentation

## 2021-09-15 DIAGNOSIS — R0789 Other chest pain: Secondary | ICD-10-CM | POA: Diagnosis not present

## 2021-09-15 DIAGNOSIS — Z7982 Long term (current) use of aspirin: Secondary | ICD-10-CM | POA: Insufficient documentation

## 2021-09-15 DIAGNOSIS — I259 Chronic ischemic heart disease, unspecified: Secondary | ICD-10-CM

## 2021-09-15 HISTORY — DX: Secondary malignant neoplasm of bone: C79.51

## 2021-09-15 HISTORY — DX: Secondary malignant neoplasm of bone: C61

## 2021-09-16 ENCOUNTER — Emergency Department (HOSPITAL_COMMUNITY): Payer: BC Managed Care – PPO

## 2021-09-16 ENCOUNTER — Encounter (HOSPITAL_COMMUNITY): Payer: Self-pay

## 2021-09-16 ENCOUNTER — Other Ambulatory Visit: Payer: Self-pay

## 2021-09-16 DIAGNOSIS — C7951 Secondary malignant neoplasm of bone: Secondary | ICD-10-CM | POA: Diagnosis not present

## 2021-09-16 DIAGNOSIS — B1920 Unspecified viral hepatitis C without hepatic coma: Secondary | ICD-10-CM | POA: Diagnosis not present

## 2021-09-16 DIAGNOSIS — G893 Neoplasm related pain (acute) (chronic): Secondary | ICD-10-CM | POA: Diagnosis not present

## 2021-09-16 DIAGNOSIS — F17210 Nicotine dependence, cigarettes, uncomplicated: Secondary | ICD-10-CM | POA: Diagnosis not present

## 2021-09-16 DIAGNOSIS — M109 Gout, unspecified: Secondary | ICD-10-CM | POA: Diagnosis not present

## 2021-09-16 DIAGNOSIS — R160 Hepatomegaly, not elsewhere classified: Secondary | ICD-10-CM | POA: Diagnosis not present

## 2021-09-16 DIAGNOSIS — R079 Chest pain, unspecified: Secondary | ICD-10-CM | POA: Diagnosis present

## 2021-09-16 DIAGNOSIS — I25110 Atherosclerotic heart disease of native coronary artery with unstable angina pectoris: Secondary | ICD-10-CM | POA: Diagnosis not present

## 2021-09-16 DIAGNOSIS — F32A Depression, unspecified: Secondary | ICD-10-CM | POA: Diagnosis not present

## 2021-09-16 DIAGNOSIS — Z515 Encounter for palliative care: Secondary | ICD-10-CM

## 2021-09-16 DIAGNOSIS — C61 Malignant neoplasm of prostate: Secondary | ICD-10-CM | POA: Diagnosis present

## 2021-09-16 DIAGNOSIS — E785 Hyperlipidemia, unspecified: Secondary | ICD-10-CM | POA: Diagnosis not present

## 2021-09-16 DIAGNOSIS — R531 Weakness: Secondary | ICD-10-CM | POA: Diagnosis not present

## 2021-09-16 DIAGNOSIS — J9811 Atelectasis: Secondary | ICD-10-CM | POA: Diagnosis not present

## 2021-09-16 DIAGNOSIS — Z7189 Other specified counseling: Secondary | ICD-10-CM

## 2021-09-16 DIAGNOSIS — K7689 Other specified diseases of liver: Secondary | ICD-10-CM | POA: Diagnosis not present

## 2021-09-16 DIAGNOSIS — R091 Pleurisy: Secondary | ICD-10-CM | POA: Diagnosis not present

## 2021-09-16 DIAGNOSIS — F1110 Opioid abuse, uncomplicated: Secondary | ICD-10-CM | POA: Diagnosis not present

## 2021-09-16 DIAGNOSIS — Z743 Need for continuous supervision: Secondary | ICD-10-CM | POA: Diagnosis not present

## 2021-09-16 DIAGNOSIS — F1410 Cocaine abuse, uncomplicated: Secondary | ICD-10-CM | POA: Diagnosis not present

## 2021-09-16 LAB — D-DIMER, QUANTITATIVE: D-Dimer, Quant: 3.34 ug/mL-FEU — ABNORMAL HIGH (ref 0.00–0.50)

## 2021-09-16 LAB — TROPONIN I (HIGH SENSITIVITY)
Troponin I (High Sensitivity): 5 ng/L (ref <18)
Troponin I (High Sensitivity): 6 ng/L (ref <18)

## 2021-09-16 LAB — BASIC METABOLIC PANEL
Anion gap: 9 (ref 5–15)
BUN: 12 mg/dL (ref 6–20)
CO2: 21 mmol/L — ABNORMAL LOW (ref 22–32)
Calcium: 9.1 mg/dL (ref 8.9–10.3)
Chloride: 106 mmol/L (ref 98–111)
Creatinine, Ser: 0.85 mg/dL (ref 0.61–1.24)
GFR, Estimated: >60 mL/min (ref >60)
Glucose, Bld: 116 mg/dL — ABNORMAL HIGH (ref 70–99)
Potassium: 3.9 mmol/L (ref 3.5–5.1)
Sodium: 136 mmol/L (ref 135–145)

## 2021-09-16 LAB — URINALYSIS, ROUTINE W REFLEX MICROSCOPIC
Bilirubin Urine: NEGATIVE
Glucose, UA: NEGATIVE mg/dL
Hgb urine dipstick: NEGATIVE
Ketones, ur: NEGATIVE mg/dL
Leukocytes,Ua: NEGATIVE
Nitrite: NEGATIVE
Protein, ur: NEGATIVE mg/dL
Specific Gravity, Urine: 1.002 — ABNORMAL LOW (ref 1.005–1.030)
pH: 6 (ref 5.0–8.0)

## 2021-09-16 LAB — LACTIC ACID, PLASMA: Lactic Acid, Venous: 1 mmol/L (ref 0.5–1.9)

## 2021-09-16 LAB — SARS CORONAVIRUS 2 BY RT PCR: SARS Coronavirus 2 by RT PCR: NEGATIVE

## 2021-09-16 LAB — CBC
HCT: 33.3 % — ABNORMAL LOW (ref 39.0–52.0)
Hemoglobin: 11.2 g/dL — ABNORMAL LOW (ref 13.0–17.0)
MCH: 31.3 pg (ref 26.0–34.0)
MCHC: 33.6 g/dL (ref 30.0–36.0)
MCV: 93 fL (ref 80.0–100.0)
Platelets: 114 10*3/uL — ABNORMAL LOW (ref 150–400)
RBC: 3.58 MIL/uL — ABNORMAL LOW (ref 4.22–5.81)
RDW: 14.1 % (ref 11.5–15.5)
WBC: 9.6 10*3/uL (ref 4.0–10.5)
nRBC: 0 % (ref 0.0–0.2)

## 2021-09-16 LAB — APTT: aPTT: 32 seconds (ref 24–36)

## 2021-09-16 LAB — PROTIME-INR
INR: 1.2 (ref 0.8–1.2)
Prothrombin Time: 14.9 seconds (ref 11.4–15.2)

## 2021-09-16 IMAGING — CR DG CHEST 2V
2 series · 2 of 2 positions shown · non-contrast
Comparison: [DATE], CT chest [DATE], chest x-ray [DATE]

CLINICAL DATA: Chest pain

EXAM:
CHEST - 2 VIEW

[chest pa]
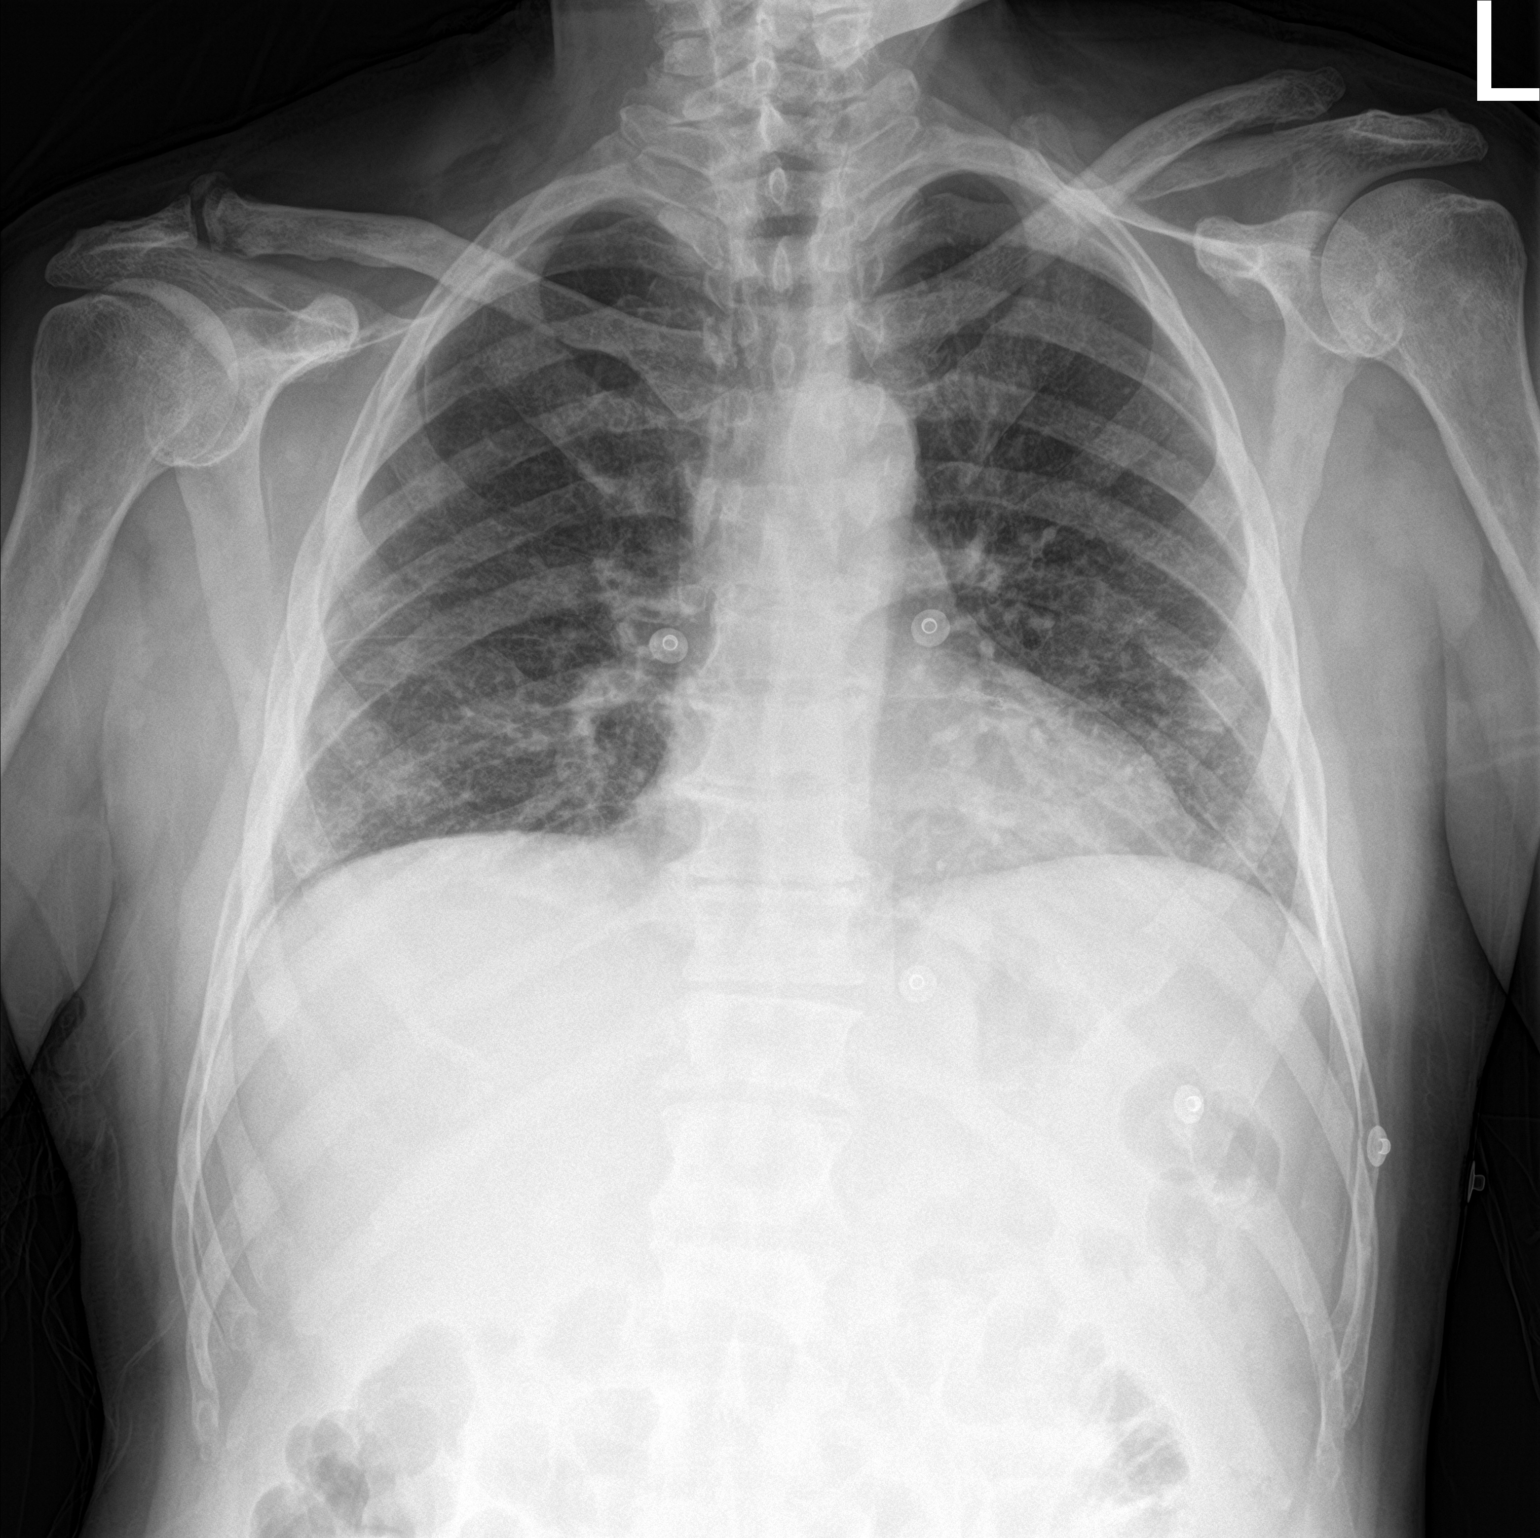

[chest lat]
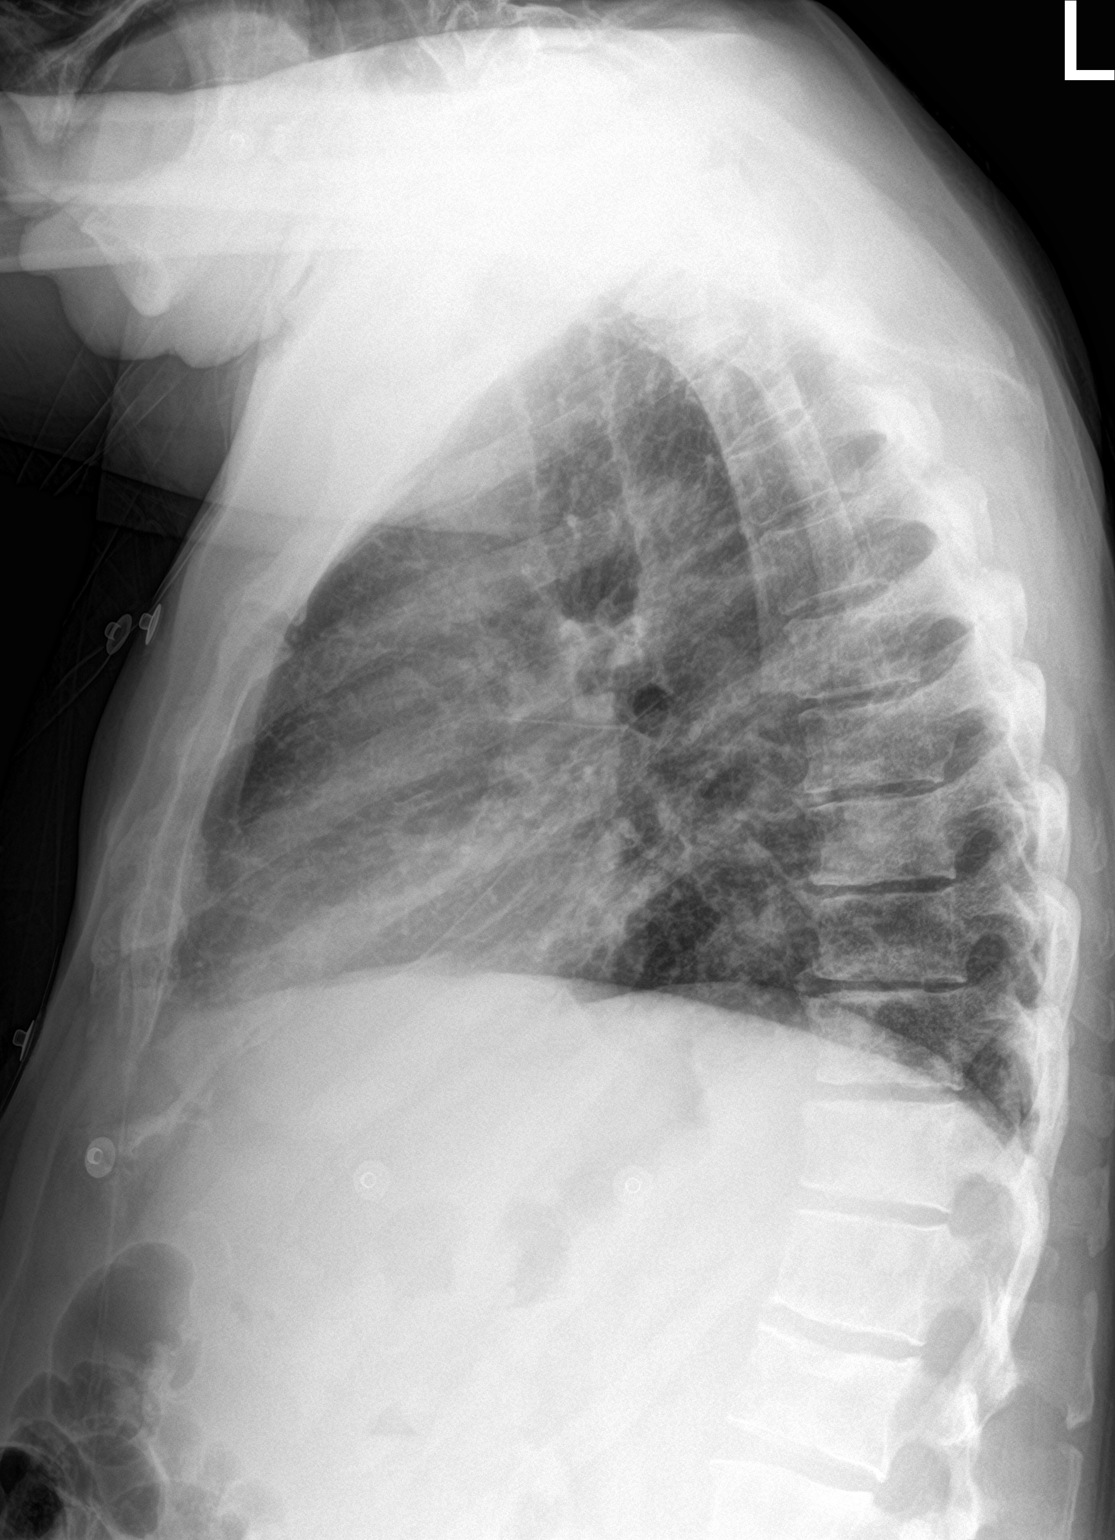

[2 of 2 positions shown; findings below may reference images not displayed]

FINDINGS: Mildly diminished lung volumes. Mild reticular opacity at the bases
likely due to chronic disease. Difficult to exclude mild acute
ground-glass opacity. Normal cardiac size. No pneumothorax
IMPRESSION: 1. Possible patchy ground-glass opacity/pneumonia at the bases
superimposed on underlying mild chronic interstitial changes.

## 2021-09-16 MED ORDER — ONDANSETRON HCL 4 MG/2ML IJ SOLN: 4.0000 mg | Freq: Four times a day (QID) | INTRAMUSCULAR | Status: DC | PRN

## 2021-09-16 MED ORDER — NITROGLYCERIN 0.4 MG SL SUBL
0.4000 mg | SUBLINGUAL_TABLET | SUBLINGUAL | Status: DC | PRN
  Administered 2021-09-16: 0.4 mg via SUBLINGUAL
  Filled 2021-09-16: qty 1

## 2021-09-16 MED ORDER — DIPHENHYDRAMINE HCL 50 MG/ML IJ SOLN: 12.5000 mg | INTRAMUSCULAR | Status: DC | PRN

## 2021-09-16 MED ORDER — GLYCOPYRROLATE 0.2 MG/ML IJ SOLN: 0.2000 mg | INTRAMUSCULAR | Status: DC | PRN

## 2021-09-16 MED ORDER — IOHEXOL 350 MG/ML SOLN
75.0000 mL | Freq: Once | INTRAVENOUS | Status: AC | PRN
  Administered 2021-09-16: 75 mL via INTRAVENOUS

## 2021-09-16 MED ORDER — LIDOCAINE VISCOUS HCL 2 % MT SOLN
15.0000 mL | Freq: Once | OROMUCOSAL | Status: AC
  Administered 2021-09-16: 15 mL via ORAL
  Filled 2021-09-16: qty 15

## 2021-09-16 MED ORDER — LORAZEPAM 2 MG/ML IJ SOLN: 0.5000 mg | INTRAMUSCULAR | Status: DC | PRN

## 2021-09-16 MED ORDER — BIOTENE DRY MOUTH MT LIQD: 15.0000 mL | OROMUCOSAL | Status: DC | PRN

## 2021-09-16 MED ORDER — POLYVINYL ALCOHOL 1.4 % OP SOLN: 1.0000 [drp] | Freq: Four times a day (QID) | OPHTHALMIC | Status: DC | PRN

## 2021-09-16 MED ORDER — LORAZEPAM 2 MG/ML IJ SOLN: 1.0000 mg | INTRAMUSCULAR | Status: DC | PRN

## 2021-09-16 MED ORDER — FENTANYL CITRATE PF 50 MCG/ML IJ SOSY
100.0000 ug | PREFILLED_SYRINGE | Freq: Once | INTRAMUSCULAR | Status: AC
  Administered 2021-09-16: 100 ug via INTRAVENOUS
  Filled 2021-09-16: qty 2

## 2021-09-16 MED ORDER — LORAZEPAM 2 MG/ML PO CONC: 1.0000 mg | ORAL | Status: DC | PRN

## 2021-09-16 MED ORDER — ACETAMINOPHEN 325 MG PO TABS: 650.0000 mg | ORAL_TABLET | Freq: Four times a day (QID) | ORAL | Status: DC | PRN

## 2021-09-16 MED ORDER — FENTANYL CITRATE PF 50 MCG/ML IJ SOSY
50.0000 ug | PREFILLED_SYRINGE | Freq: Once | INTRAMUSCULAR | Status: AC
  Administered 2021-09-16: 50 ug via INTRAVENOUS
  Filled 2021-09-16 (×2): qty 1

## 2021-09-16 MED ORDER — LORAZEPAM 2 MG/ML IJ SOLN
1.0000 mg | INTRAMUSCULAR | Status: DC
  Administered 2021-09-16: 1 mg via INTRAVENOUS
  Filled 2021-09-16: qty 1

## 2021-09-16 MED ORDER — ALUM & MAG HYDROXIDE-SIMETH 200-200-20 MG/5ML PO SUSP
30.0000 mL | Freq: Once | ORAL | Status: AC
  Administered 2021-09-16: 30 mL via ORAL
  Filled 2021-09-16: qty 30

## 2021-09-16 MED ORDER — HALOPERIDOL LACTATE 5 MG/ML IJ SOLN: 0.5000 mg | INTRAMUSCULAR | Status: DC | PRN

## 2021-09-16 MED ORDER — MORPHINE BOLUS VIA INFUSION
2.0000 mg | INTRAVENOUS | Status: DC | PRN
  Administered 2021-09-16: 2 mg via INTRAVENOUS

## 2021-09-16 MED ORDER — HALOPERIDOL LACTATE 2 MG/ML PO CONC: 0.5000 mg | ORAL | Status: DC | PRN

## 2021-09-16 MED ORDER — HALOPERIDOL 0.5 MG PO TABS: 0.5000 mg | ORAL_TABLET | ORAL | Status: DC | PRN

## 2021-09-16 MED ORDER — LORAZEPAM 1 MG PO TABS: 1.0000 mg | ORAL_TABLET | ORAL | Status: DC | PRN

## 2021-09-16 MED ORDER — MORPHINE 100MG IN NS 100ML (1MG/ML) PREMIX INFUSION
5.0000 mg/h | INTRAVENOUS | Status: DC
  Administered 2021-09-16: 5 mg/h via INTRAVENOUS
  Filled 2021-09-16: qty 100

## 2021-09-16 MED ORDER — ONDANSETRON 4 MG PO TBDP: 4.0000 mg | ORAL_TABLET | Freq: Four times a day (QID) | ORAL | Status: DC | PRN

## 2021-09-16 MED ORDER — ACETAMINOPHEN 650 MG RE SUPP: 650.0000 mg | Freq: Four times a day (QID) | RECTAL | Status: DC | PRN

## 2021-09-16 MED ORDER — GLYCOPYRROLATE 1 MG PO TABS: 1.0000 mg | ORAL_TABLET | ORAL | Status: DC | PRN

## 2021-09-16 NOTE — ED Triage Notes (Signed)
Pt BIB EMS from home due to cp that started x1 day ago. Pt has cardiac h/o. Pt had 2 stents place 6-7 months ago. Pt took Asprin with relief.

## 2021-09-16 NOTE — TOC Initial Note (Addendum)
Transition of Care [facility]) - Initial/Assessment Note    Patient Details  Name: Edgar Mooney MRN: [id number] Date of Birth: [birth year]  Transition of Care [facility]) CM/SW Contact:    Verdell Carmine, RN Phone Number: 09/16/2021, 10:44 AM  Clinical Narrative:                  Patient presented with pain. He has prostate Ca with mets, bone, liver etc. His main goal is comfort care and pain relief.  He was started on IV morphine, Hospice consulted for possible [facility] transfer [facility] [place]). Palliative will see him , goal is to get him to [facility] place. IF not possible, then he will be admitted for pain control and comfort measures.   1130 message from palliative indicating that [facility] place is unlikely. Called and left message with [facility] to check their ability in [place]. [place], [phone]  They do not take drips, but PCA, they have no beds. 1224 Received a call back from [place]/ [employer]. They don ot have beds in [place] but potentially have one in [place], however it would not likely be today.  Told them we would formally consult if there was a change. Messaged Team regarding update.  Expected Discharge Plan: [place] Barriers to Discharge: ED No Barriers   Patient Goals and CMS Choice        Expected Discharge Plan and Services Expected Discharge Plan: [place]   Discharge Planning Services: CM Consult                                          Prior Living Arrangements/Services     Patient language and need for interpreter reviewed:: Yes        Need for Family Participation in Patient Care: Yes (Comment) Care giver support system in place?: Yes (comment)   Criminal Activity/Legal Involvement Pertinent to Current Situation/Hospitalization: No - Comment as needed  Activities of Daily Living      Permission Sought/Granted                  Emotional Assessment        Orientation: : Oriented to Self, Oriented to Place Alcohol / Substance Use: Illicit Drugs Psych Involvement: No (comment)  Admission diagnosis:  Chest pain [R07.9] Prostate cancer metastatic to bone ([place]) [C61, C79.51] Patient Active Problem List   Diagnosis Date Noted   Chest pain 09/16/2021   Prostate cancer metastatic to bone ([place]) 09/16/2021   Admission for end of life care 09/16/2021   Angina pectoris ([place])    Goals of care, counseling/discussion 33/00/7622   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   Genetic testing 04/04/2021   Unstable angina ([place]) 12/19/2020   Prostate cancer ([place]) 03/07/2020   Anemia 03/03/2020   Hypertriglyceridemia 02/27/2018   Cocaine abuse with cocaine-induced mood disorder ([place]) 04/03/2016   Depression    Idiopathic thrombocytopenic purpura ([place]) 01/14/2016   Polysubstance abuse ([place]) 01/14/2016   Benzodiazepine abuse ([place]) 01/05/2016   Major depressive disorder, recurrent episode, severe, 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

## 2021-09-16 NOTE — ED Notes (Signed)
Report to Helene Kelp, Therapist, sports at [facility].  PTAR en route, pt to be transferred for Hospice Care.

## 2021-09-16 NOTE — ED Provider Notes (Signed)
[facility] EMERGENCY DEPARTMENT Provider Note   CSN: [id number] Arrival date & time: 09/15/21  2359     History  Chief Complaint  Patient presents with   Chest Pain    Edgar Mooney is a 56 y.o. male.  The history is provided by the patient.  Chest Pain Pain location:  L chest Pain quality: sharp   Pain radiates to:  Does not radiate Pain severity:  Moderate Duration:  2 days Timing:  Constant Progression:  Unchanged Chronicity:  New Relieved by:  Aspirin Worsened by:  Deep breathing Associated symptoms: fever and shortness of breath   Associated symptoms: no cough    Patient with history of metastatic prostate cancer, CAD presents with chest pain.  Patient reports for the past 2 days he has had left-sided sharp chest pain that is worse with breathing.  He took aspirin with some relief He also reports fevers and chills.  He reports shortness of breath but no cough.  No vomiting or diarrhea.  Home Medications Prior to Admission medications   Medication Sig Start Date End Date Taking? Authorizing Provider  allopurinol (ZYLOPRIM) 100 MG tablet TAKE 1 TABLET BY MOUTH EVERY DAY 07/12/21   Owens Shark, NP  amLODipine (NORVASC) 5 MG tablet Take 1 tablet (5 mg total) by mouth daily. 08/02/21   [name], MD  aspirin 81 MG chewable tablet Chew 1 tablet (81 mg total) by mouth daily. 12/24/20   Charlynne Cousins, MD  isosorbide mononitrate (IMDUR) 30 MG 24 hr tablet Take 0.5 tablets (15 mg total) by mouth daily. 08/02/21   [name], MD  nitroGLYCERIN (NITROSTAT) 0.4 MG SL tablet Place 1 tablet (0.4 mg total) under the tongue every 5 (five) minutes as needed for chest pain. 12/23/20   Charlynne Cousins, MD  oxyCODONE (OXY IR/ROXICODONE) 5 MG immediate release tablet Take 1 tablet (5 mg total) by mouth every 6 (six) hours as needed for severe pain. 09/14/21   Ladell Pier, MD  predniSONE (DELTASONE) 5 MG tablet Take 1 tablet (5 mg total) by mouth 2  (two) times daily with a meal. 05/31/21   Owens Shark, NP  rosuvastatin (CRESTOR) 20 MG tablet Take 1 tablet (20 mg total) by mouth daily. 08/02/21   [name], MD  tamsulosin (FLOMAX) 0.4 MG CAPS capsule Take 1 capsule (0.4 mg total) by mouth daily. 05/11/21   Ladell Pier, MD  ticagrelor (BRILINTA) 90 MG TABS tablet Take 1 tablet (90 mg total) by mouth 2 (two) times daily. 12/23/20   Charlynne Cousins, MD      Allergies    Patient has no known allergies.    Review of Systems   Review of Systems  Constitutional:  Positive for chills and fever.  Respiratory:  Positive for shortness of breath. Negative for cough.   Cardiovascular:  Positive for chest pain.   Physical Exam Updated Vital Signs BP 102/66   Pulse 74   Temp 100 F (37.8 C) (Oral)   Resp 20   Ht 1.575 m ('5\' 2"'$ )   Wt 70.3 kg   SpO2 96%   BMI 28.35 kg/m  Physical Exam CONSTITUTIONAL: Chronically ill-appearing HEAD: Normocephalic/atraumatic EYES: EOMI/PERRL ENMT: Mucous membranes moist NECK: supple no meningeal signs SPINE/BACK:entire spine nontender CV: S1/S2 noted, no murmurs/rubs/gallops noted LUNGS: Bibasilar crackles noted Chest-left-sided tenderness noted, no crepitus ABDOMEN: soft, nontender, no rebound or guarding, bowel sounds noted throughout abdomen GU:no cva tenderness NEURO: Pt is  awake/alert/appropriate, moves all extremitiesx4.  No facial droop.   EXTREMITIES: pulses normal/equal, full ROM SKIN: warm, color normal PSYCH: no abnormalities of mood noted, alert and oriented to situation  ED Results / Procedures / Treatments   Labs (all labs ordered are listed, but only abnormal results are displayed) Labs Reviewed  BASIC METABOLIC PANEL - Abnormal; Notable for the following components:      Result Value   CO2 21 (*)    Glucose, Bld 116 (*)    All other components within normal limits  CBC - Abnormal; Notable for the following components:   RBC 3.58 (*)    Hemoglobin 11.2 (*)    HCT  33.3 (*)    Platelets 114 (*)    All other components within normal limits  URINALYSIS, ROUTINE W REFLEX MICROSCOPIC - Abnormal; Notable for the following components:   Color, Urine STRAW (*)    Specific Gravity, Urine 1.002 (*)    All other components within normal limits  D-DIMER, QUANTITATIVE - Abnormal; Notable for the following components:   D-Dimer, Quant 3.34 (*)    All other components within normal limits  SARS CORONAVIRUS 2 BY RT PCR  CULTURE, BLOOD (ROUTINE X 2)  CULTURE, BLOOD (ROUTINE X 2)  LACTIC ACID, PLASMA  PROTIME-INR  APTT  TROPONIN I (HIGH SENSITIVITY)  TROPONIN I (HIGH SENSITIVITY)    EKG EKG Interpretation  Date/Time:  Saturday September 16 2021 00:00:01 EDT Ventricular Rate:  79 PR Interval:  140 QRS Duration: 92 QT Interval:  392 QTC Calculation: 449 R Axis:   -25 Text Interpretation: Normal sinus rhythm Normal ECG Confirmed by Ripley Fraise [phone]) on 09/16/2021 1:52:39 AM  Radiology DG Chest 2 View  Result Date: 09/16/2021 CLINICAL DATA:  Chest pain EXAM: CHEST - 2 VIEW COMPARISON:  12/19/2020, CT chest 03/03/2020, chest x-ray 03/03/2020 FINDINGS: Mildly diminished lung volumes. Mild reticular opacity at the bases likely due to chronic disease. Difficult to exclude mild acute ground-glass opacity. Normal cardiac size. No pneumothorax IMPRESSION: 1. Possible patchy ground-glass opacity/pneumonia at the bases superimposed on underlying mild chronic interstitial changes. Electronically Signed   By: Donavan Foil M.D.   On: 09/16/2021 00:52   CT Angio Chest PE W and/or Wo Contrast  Result Date: 09/16/2021 CLINICAL DATA:  History of hepatocellular carcinoma presenting with chest pain and elevated D-dimer. EXAM: CT ANGIOGRAPHY CHEST WITH CONTRAST TECHNIQUE: Multidetector CT imaging of the chest was performed using the standard protocol during bolus administration of intravenous contrast. Multiplanar CT image reconstructions and MIPs were obtained to evaluate the  vascular anatomy. RADIATION DOSE REDUCTION: This exam was performed according to the departmental dose-optimization program which includes automated exposure control, adjustment of the mA and/or kV according to patient size and/or use of iterative reconstruction technique. CONTRAST:  24m OMNIPAQUE IOHEXOL 350 MG/ML SOLN COMPARISON:  March 03, 2020 FINDINGS: Cardiovascular: There is mild calcification of the aortic arch, without evidence of aortic aneurysm satisfactory opacification of the pulmonary arteries to the segmental level. No evidence of pulmonary embolism. Normal heart size. A coronary artery stent is in place. No pericardial effusion. Mediastinum/Nodes: No enlarged mediastinal, hilar, or axillary lymph nodes. Thyroid gland, trachea, and esophagus demonstrate no significant findings. Lungs/Pleura: Mild atelectasis is seen within the posterior aspects of the bilateral lower lobes. There is no evidence of acute infiltrate, pleural effusion or pneumothorax. Upper Abdomen: A 7.3 cm x 5.5 cm exophytic heterogeneous low-attenuation liver mass is seen within the posterolateral aspect of the right lobe (axial CT images 113  through 131, CT series 5). Musculoskeletal: Extensive patchy, heterogeneous sclerotic areas are seen throughout the osseous skeleton. This is increased in severity when compared to the prior study. Review of the MIP images confirms the above findings. IMPRESSION: 1. No evidence of pulmonary embolism or other acute intrathoracic process. 2. Large liver mass consistent with the patient's history of hepatocellular carcinoma. 3. Findings consistent with extensive osseous metastatic disease. Aortic Atherosclerosis (ICD10-I70.0). Electronically Signed   By: Virgina Norfolk M.D.   On: 09/16/2021 04:17    Procedures Procedures    Medications Ordered in ED Medications  nitroGLYCERIN (NITROSTAT) SL tablet 0.4 mg (0.4 mg Sublingual Given 09/16/21 0536)  fentaNYL (SUBLIMAZE) injection 50 mcg (0  mcg Intravenous Hold 09/16/21 0601)  alum & mag hydroxide-simeth (MAALOX/MYLANTA) 200-200-20 MG/5ML suspension 30 mL (has no administration in time range)    And  lidocaine (XYLOCAINE) 2 % viscous mouth solution 15 mL (has no administration in time range)  fentaNYL (SUBLIMAZE) injection 100 mcg (100 mcg Intravenous Given 09/16/21 0218)  iohexol (OMNIPAQUE) 350 MG/ML injection 75 mL (75 mLs Intravenous Contrast Given 09/16/21 0405)    ED Course/ Medical Decision Making/ A&P Clinical Course as of 09/16/21 0611  Sat Sep 16, 2021  0335 Patient with complicated history including prostate cancer as well as CAD.  He just had a cardiac cath earlier this spring that revealed stable CAD.  Initial EKG and troponin unremarkable.  However due to pleuritic pain and history of cancer, we are evaluating for pulmonary embolism [DW]  [phone] Extensive work-up has been unrevealing the patient is continue to have chest pain.  Nitroglycerin has been ordered [DW]  8588 Patient still having persistent chest pain.  Could be related to his underlying metastatic cancer.  Due to intractable pain and his multiple risk factors, patient will be admitted [DW]  0611 Discussed with Dr. Cyd Silence for admission [DW]    Clinical Course User Index [DW] Ripley Fraise, MD                           Medical Decision Making Amount and/or Complexity of Data Reviewed Labs: ordered. Radiology: ordered.  Risk OTC drugs. Prescription drug management. Decision regarding hospitalization.   This patient presents to the ED for concern of chest pain, this involves an extensive number of treatment options, and is a complaint that carries with it a high risk of complications and morbidity.  The differential diagnosis includes but is not limited to acute coronary syndrome, aortic dissection, pulmonary embolism, pericarditis, pneumothorax, pneumonia, myocarditis, pleurisy, esophageal rupture    Comorbidities that complicate the patient  evaluation: Patient's presentation is complicated by their history of CAD and prostate cancer  Social Determinants of Health: Patient's  tobacco use   increases the complexity of managing their presentation  Additional history obtained: Records reviewed previous admission documents and oncology note  Lab Tests: I Ordered, and personally interpreted labs.  The pertinent results include: Mild anemia, elevated D-dimer  Imaging Studies ordered: I ordered imaging studies including CT scan chest and X-ray chest   I independently visualized and interpreted imaging which showed no acute PE I agree with the radiologist interpretation  Cardiac Monitoring: The patient was maintained on a cardiac monitor.  I personally viewed and interpreted the cardiac monitor which showed an underlying rhythm of:  sinus rhythm  Medicines ordered and prescription drug management: I ordered medication including fentanyl for pain Reevaluation of the patient after these medicines showed that the patient  stayed the same   Critical Interventions:  IV pain medications, nitroglycerin  Consultations Obtained: I requested consultation with the admitting physician Dr. Cyd Silence , and discussed  findings as well as pertinent plan - they recommend: We will admit for intractable pain  Reevaluation: After the interventions noted above, I reevaluated the patient and found that they have :stayed the same  Complexity of problems addressed: Patient's presentation is most consistent with  acute presentation with potential threat to life or bodily function  Disposition: After consideration of the diagnostic results and the patient's response to treatment,  I feel that the patent would benefit from admission   .           Final Clinical Impression(s) / ED Diagnoses Final diagnoses:  Pleurisy  Chest pain due to myocardial ischemia, unspecified ischemic chest pain type    Rx / DC Orders ED Discharge Orders      None         Ripley Fraise, MD 09/16/21 [phone]

## 2021-09-16 NOTE — Progress Notes (Signed)
Manufacturing engineer [facility]) Hospital Liaison Note   Hospice eligibility approved.    Patient aware and requested to complete consents; however, multiple attempts made by [facility] staff for consents to be completed by patient and he was too lethargic to sign. [facility] able to make contact with patients children and documents completed.   EMS notified of patient D/C and transport arranged by TOC/Stephanie. Attending Physician/Dr. Lorin Mercy also notified of transport arrangement.    Please send signed DNR form with patient and RN call report to [phone].    Daphene Calamity, MSW [facility] Liaison [phone]

## 2021-09-16 NOTE — Progress Notes (Signed)
Situation: Chaplain Medinas-Lockley responding to referral for pt Edgar Mooney regarding "this patient has requested his last rites."  Background: Facts: Edgar Mooney was in a room in the ED at time of visit; for much of the visit, he audibly groaned and complained of pain in his back and shoulder (this chaplain notified the nurse of Edgar Mooney's pain). He shared "I can't think," due to the pain he was in. The lights were not on in the room, and Edgar Mooney seemed to prefer this. Edgar Mooney shared that "hospice is going to come talk to me." He shared that he has "stage 4 bone cancer." Family: Edgar Mooney shared that he had just gotten off the phone with his Edgar Mooney before the visit. He shared that he has children and grandchildren, and that his kids were probably on their way to the hospital. 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   Actions & Assessments: During the visit, Edgar Mooney seemed to desire comfort and peace. This chaplain assessed that he did not request "last rites" at this time, but desired quiet and rest; therefore, this chaplain shared with him to ask for a chaplain if he would desire a priest visit at a different time. This chaplain asked if he'd like prayer, which Edgar Mooney accepted and thanked this chaplain for at the end of the visit.  Recommendations: For Chaplains: Be mindful of how Edgar Mooney is coping with his pain (whether desiring conversational distraction or  quiet/dark space to rest), and how spiritual/emotional pain may amplify his physical pain. If possible, a continued conversation around family and his legacy may be a helpful meaning-making practice. Continue to assess whether Edgar Mooney would like Catholic-specific spiritual resources, such as a visit from a priest or sacrament. For Staff: Be mindful of how Edgar Mooney is coping with his pain (whether desiring conversational distraction or quiet/dark space to rest), and how spiritual/emotional pain may amplify his physical pain. If Edgar Mooney requests a priest or Catholic-specific sacrament (such as Anointing of the Sick, formerly known as 'last rites'), please page a chaplain to attempt to arrange.  Chaplain remains available for follow-up spiritual/emotional support as needed.  Rev. Edgar Mooney, MDiv      09/16/21 1100  Clinical Encounter Type  Visited With Patient  Visit Type Initial;Spiritual support;ED  Referral From Nurse  Spiritual Encounters  Spiritual Needs Prayer;Emotional

## 2021-09-16 NOTE — Consult Note (Addendum)
Palliative Medicine Inpatient Consult Note  Consulting Provider: Rosezella Rumpf, NP  Reason for consult:   Palliative Care Consult Services Symptom Management Consult  Reason for Consult? end of life care   09/16/2021  HPI:  Per intake H&P --> Edgar Mooney is a 56 y.o. male with medical history significant of HTN, remote polysubstance abuse, tobacco dependence, metastatic prostate CA, and CAD with h/o stent and recent cath (08/08/21) with multivessel disease and plan for medical management presenting with chest pain.  He was last seen on 5/9 by Dr. Benay Mooney for metastatic prostate CA with widespread osseous metastatic disease, on ongoing chemotherapy (docetaxel).  He reports that he is having chest pain all the way through to his back.  His kidneys also feel like they are hurting.  Pain has been there for about 2 days.  It does feel similar to his prior cardiac chest pain.  He is unsure about the status of his prostate cancer.  The cancer is all in his bones.  He is undergoing chemotherapy but is not noticing much improvement.   Palliative care has been asked to assist with goals of care conversations in the setting of end of life symptom management.  Clinical Assessment/Goals of Care:  *Please note that this is a verbal dictation therefore any spelling or grammatical errors are due to the "[place] [employer] [employer]" system interpretation.  I have reviewed medical records including EPIC notes, labs and imaging, received report from bedside RN, assessed the patient who is curled up in bed endorsing horrific pain.    I met with Edgar Mooney to further discuss diagnosis prognosis, GOC, EOL wishes, disposition and options.   I introduced Palliative Medicine as specialized medical care for people living with serious illness. It focuses on providing relief from the symptoms and stress of a serious illness. The goal is to improve quality of life for both the patient and the family.  Medical History  Review and Understanding:  A discussion of Edgar Mooney's medical conditions in the setting of metastatic prostate cancer, hepatitis C, depression, coronary artery disease, hypertension, and polysubstance drug abuse was held.  Reviewed patient's ongoing pain in the setting of his metastatic disease which is noted in his bones and hepatic system.  Social History:  Edgar Mooney lives in [place], [place].  He is not presently working.  We reviewed that he is living presently with his ex-wife where he has been for the last year.    Functional and Nutritional State:  [name] was fairly functional prior to admission though has had a dwindling appetite.  Palliative Symptoms:  Unremitting pain-patient is on a morphine drip which has been bolused and increased.  Advance Directives:  Patient defers to his ex-wife, Edgar Mooney for additional decisions.  Code Status:  Encouraged Edgar Mooney to consider DNR/DNI status understanding evidenced based poor outcomes in similar hospitalized patient, as the cause of arrest is likely associated with advanced chronic/terminal illness rather than an easily reversible acute cardio-pulmonary event. I explained that DNR/DNI does not change the medical plan and it only comes into effect after a person has arrested (died).  It is a protective measure to keep [place] from harming the patient in their last moments of life. Edgar Mooney was agreeable to DNR/DNI with understanding that patient would not receive CPR, defibrillation, ACLS medications, or intubation.   Discussion:  Edgar Mooney is in his right state of mind this morning and able to vocalize his wishes for comfort.  We reviewed that unfortunately Edgar Mooney's cancer treatments do not  seem to have been effective as he has diffuse disease seen throughout his body.  Edgar Mooney's greatest concern is the horrific pain that he is experiencing as well as anxiety secondary to that.  We devised a plan to continue morphine drip with boluses and increased as well as adding  Ativan around the clock.   Edgar Mooney is very tearful when we reviewed his limited prognosis in the setting of his disease and symptom burden.   Edgar Mooney requests that his daughter is called and informed of his depleting health state.   Reviewed the idea of Edgar Mooney transitioning to another facility, an inpatient hospice facility for further symptom management.  I shared that these environments are often more comfortable for patients and less sterile and more home like environment.  He is agreeable to this.  ____________________________ Addendum:  Have called patient's ex-wife Edgar Mooney though the phone rang through to voicemail. I will continue to try to call to retrieve patient's daughter's information to provide her an update. ___________________________ Addendum #2:  I stopped by patients bedside this afternoon. His symptoms appear under better control. He is resting peacefully. His son and daughter are at bedside and updated. They are in shock at his steep decline. Offered support through therapeutic listening.   Decision Maker: Patient presently able to make decisions for himself though he defers to his ex-wife, Edgar Mooney.  SUMMARY OF RECOMMENDATIONS   DNAR/DNI  Comfort Care - Continue morphine gtt  Added ativan ATC  Chaplain consult - Patient requests last rights  Prognosis < 2 weeks  Appreciate transitions of care team with pursuit of placement at [facility]: DNAR/DNI  Palliative Prophylaxis:  Aspiration, Bowel Regimen, Delirium Protocol, Frequent Pain Assessment, Oral Care, Palliative Wound Care, and Turn Reposition  Additional Recommendations (Limitations, Scope, Preferences): Comfort care  Psycho-social/Spiritual:  Desire for further Chaplaincy support: Yes - Catholic Additional Recommendations: Education on metastatic disease   Prognosis: < 2 weeks  Discharge Planning: Ideally to inpatient hospice for continued comprehensive symptom  management.  Vitals:   09/16/21 0545 09/16/21 0700  BP: 102/66 114/67  Pulse: 74 75  Resp: 20 (!) 23  Temp:    SpO2: 96% 98%   No intake or output data in the 24 hours ending 09/16/21 1045 Last Weight  Most recent update: 09/16/2021  1:53 AM    Weight  70.3 kg (155 lb)            Gen: Caucasian male groaning in pain HEENT: Dry mucous membranes CV: Regular rate and rhythm PULM: On room air, breathing slightly labored ABD: soft/nontender EXT: No edema Neuro: Alert and oriented x3  PPS: 20-30%   This conversation/these recommendations were discussed with patient primary care team, Dr. Lorin Mercy  Billing based on MDM: High  Problems Addressed: One acute or chronic illness or injury that poses a threat to life or bodily function  Amount and/or Complexity of Data: Category 3:Discussion of management or test interpretation with external physician/other qualified health care professional/appropriate source (not separately reported)  Risks: Decision not to resuscitate or to de-escalate care because of poor prognosis ______________________________________________________ [facility] Team Team Cell Phone: [phone] Please utilize secure chat with additional questions, if there is no response within 30 minutes please call the above phone number  Palliative Medicine Team providers are available by phone from 7am to 7pm daily and can be reached through the team cell phone.  Should this patient require assistance outside of these hours, please call the  patient's attending physician.

## 2021-09-16 NOTE — ED Notes (Signed)
Ambulated to restroom without assistance. However he was moaning and groaning and c/o pain all over

## 2021-09-16 NOTE — Progress Notes (Signed)
[facility] ([facility]) Hospital Liaison Note  Referral received for patient/family interest in [facility]. Chart under review by [facility] physician.   Hospice eligibility pending.   Please call with any questions or concerns. Thank you   Shanita Wicker, LCSW [facility] Hospital Liaison  [phone] 

## 2021-09-16 NOTE — H&P (Deleted)
History and Physical    Patient: Edgar Mooney AOZ:[id number] DOB: [birth year] DOA: 09/15/2021 DOS: the patient was seen and examined on 09/16/2021 PCP: Pcp, No  Patient coming from: Home - lives with ex, her husband, his daughter, and a couple of others; NOKClaudie Fisherman, [phone]    Chief Complaint: chest pain  HPI: Edgar Mooney is a 56 y.o. male with medical history significant of HTN, remote polysubstance abuse, tobacco dependence, metastatic prostate CA, and CAD with h/o stent and recent cath (08/08/21) with multivessel disease and plan for medical management presenting with chest pain.  He was last seen on 5/9 by Dr. Benay Spice for metastatic prostate CA with widespread osseous metastatic disease, on ongoing chemotherapy (docetaxel).  He reports that he is having chest pain all the way through to his back.  His kidneys also feel like they are hurting.  Pain has been there for about 2 days.  It does feel similar to his prior cardiac chest pain.  He is unsure about the status of his prostate cancer.  The cancer is all in his bones.  He is undergoing chemotherapy but is not noticing much improvement.    ER Course:  Carryover, per Dr. Cyd Silence:  56 year old male with past medical history of coronary artery disease with hospitalization 12/2020 with unstable angina status post drug-eluting stents to the LAD and RCA, prostate cancer with extensive metastatic disease with innumerable bony metastases as well as hepatic mass thought to be hepatocellular carcinoma and hepatitis C presenting to the emergency department with intractable atypical sounding chest pain.  CT angiogram of the chest is negative.  No evidence of pneumonia.  Cardiac enzymes unremarkable.  ER provider feels that patient's severe intractable pain is due to his bony metastatic disease.  ER provider requesting hospitalization for pain control and possible hospice discussion/palliative care consultation.  I have asked that a trial of  GI cocktail dose to be administered.      Review of Systems: As mentioned in the history of present illness. All other systems reviewed and are negative. 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   Prostate cancer metastatic to bone [facility])    Past Surgical History:  Procedure Laterality Date   CORONARY STENT INTERVENTION N/A 12/21/2020   Procedure: CORONARY STENT INTERVENTION;  Surgeon: Troy Sine, MD;  Location: [facility];  Service: Cardiovascular;  Laterality: N/A;   CORONARY STENT INTERVENTION N/A 12/22/2020   Procedure: CORONARY STENT INTERVENTION;  Surgeon: Burnell Blanks, MD;  Location: [facility];  Service: Cardiovascular;  Laterality: N/A;   IR 3D INDEPENDENT WKST  02/15/2021   IR ANGIOGRAM SELECTIVE EACH ADDITIONAL VESSEL  02/15/2021   IR ANGIOGRAM SELECTIVE EACH ADDITIONAL VESSEL  02/15/2021   IR ANGIOGRAM SELECTIVE EACH ADDITIONAL VESSEL  06/26/2021   IR ANGIOGRAM SELECTIVE EACH ADDITIONAL VESSEL  06/28/2021   IR ANGIOGRAM VISCERAL SELECTIVE  02/15/2021   IR ANGIOGRAM VISCERAL SELECTIVE  02/15/2021   IR ANGIOGRAM VISCERAL SELECTIVE  06/26/2021   IR EMBO TUMOR ORGAN ISCHEMIA INFARCT INC GUIDE ROADMAPPING  06/26/2021   IR RADIOLOGIST EVAL & MGMT  01/11/2021   IR RADIOLOGIST EVAL & MGMT  07/27/2021  IR US GUIDE VASC ACCESS RIGHT  02/15/2021   IR US GUIDE VASC ACCESS RIGHT  06/26/2021   LEFT HEART CATH AND CORONARY ANGIOGRAPHY N/A 12/21/2020   Procedure: LEFT HEART CATH AND CORONARY ANGIOGRAPHY;  Surgeon: Troy Sine, MD;  Location: [facility];  Service: Cardiovascular;  Laterality: N/A;   LEFT HEART CATH AND CORONARY ANGIOGRAPHY N/A 08/08/2021   Procedure: LEFT HEART CATH AND CORONARY ANGIOGRAPHY;  Surgeon: Burnell Blanks, MD;  Location: [facility];  Service: Cardiovascular;  Laterality: N/A;   Social History:  reports that he has been smoking cigarettes. He has a 26.00 pack-year smoking history. He has never used smokeless tobacco. He reports that he does not currently use alcohol after a past usage of about 126.0 standard drinks per week. He reports current drug use. Drugs: Cocaine, IV, Heroin, and Marijuana.  No Known Allergies  Family History  Problem Relation Age of Onset   Cancer Mother    Cancer Father     Prior to Admission medications   Medication Sig Start Date End Date Taking? Authorizing Provider  allopurinol (ZYLOPRIM) 100 MG tablet TAKE 1 TABLET BY MOUTH EVERY DAY 07/12/21   Owens Shark, NP  amLODipine (NORVASC) 5 MG tablet Take 1 tablet (5 mg total) by mouth daily. 08/02/21   [name], MD  aspirin 81 MG chewable tablet Chew 1 tablet (81 mg total) by mouth daily. 12/24/20   Charlynne Cousins, MD  isosorbide mononitrate (IMDUR) 30 MG 24 hr tablet Take 0.5 tablets (15 mg total) by mouth daily. 08/02/21   [name], MD  nitroGLYCERIN (NITROSTAT) 0.4 MG SL tablet Place 1 tablet (0.4 mg total) under the tongue every 5 (five) minutes as needed for chest pain. 12/23/20   Charlynne Cousins, MD  oxyCODONE (OXY IR/ROXICODONE) 5 MG immediate release tablet Take 1 tablet (5 mg total) by mouth every 6 (six) hours as needed for severe pain. 09/14/21   Ladell Pier, MD  predniSONE (DELTASONE) 5 MG tablet Take 1 tablet (5 mg total) by mouth 2 (two) times daily with a meal. 05/31/21   Owens Shark, NP  rosuvastatin (CRESTOR) 20 MG tablet Take 1 tablet (20 mg total) by mouth daily. 08/02/21   [name], MD  tamsulosin (FLOMAX) 0.4 MG CAPS capsule Take 1 capsule (0.4 mg total) by mouth daily. 05/11/21   Ladell Pier, MD  ticagrelor (BRILINTA) 90 MG TABS tablet Take 1 tablet (90 mg total) by mouth 2 (two) times daily. 12/23/20   Charlynne Cousins, MD    Physical  Exam: Vitals:   09/16/21 0330 09/16/21 0515 09/16/21 0545 09/16/21 0700  BP: 107/62  102/66 114/67  Pulse: 72 75 74 75  Resp: (!) 24 (!) 21 20 (!) 23  Temp:      TempSrc:      SpO2: 97% 100% 96% 98%  Weight:      Height:       General:  Appears chronically ill, uncomfortable Eyes:  PERRL, EOMI, normal lids, iris ENT:  grossly normal hearing, lips & tongue, mmm; edentulous Neck:  no LAD, masses or thyromegaly Cardiovascular:  RRR, no m/r/g. No LE edema.  Respiratory:   CTA bilaterally with no wheezes/rales/rhonchi.  Normal to mildly increased respiratory effort. Abdomen:  soft, NT, ND Skin:  no rash or induration seen on limited exam Musculoskeletal:  grossly normal tone BUE/BLE, good ROM, no bony abnormality Psychiatric:  blunted mood and affect, speech  fluent and appropriate, AOx3 Neurologic:  CN 2-12 grossly intact, moves all extremities in coordinated fashion   Radiological Exams on Admission: Independently reviewed - see discussion in A/P where applicable  DG Chest 2 View  Result Date: 09/16/2021 CLINICAL DATA:  Chest pain EXAM: CHEST - 2 VIEW COMPARISON:  12/19/2020, CT chest 03/03/2020, chest x-ray 03/03/2020 FINDINGS: Mildly diminished lung volumes. Mild reticular opacity at the bases likely due to chronic disease. Difficult to exclude mild acute ground-glass opacity. Normal cardiac size. No pneumothorax IMPRESSION: 1. Possible patchy ground-glass opacity/pneumonia at the bases superimposed on underlying mild chronic interstitial changes. Electronically Signed   By: Donavan Foil M.D.   On: 09/16/2021 00:52   CT Angio Chest PE W and/or Wo Contrast  Result Date: 09/16/2021 CLINICAL DATA:  History of hepatocellular carcinoma presenting with chest pain and elevated D-dimer. EXAM: CT ANGIOGRAPHY CHEST WITH CONTRAST TECHNIQUE: Multidetector CT imaging of the chest was performed using the standard protocol during bolus administration of intravenous contrast. Multiplanar CT image  reconstructions and MIPs were obtained to evaluate the vascular anatomy. RADIATION DOSE REDUCTION: This exam was performed according to the departmental dose-optimization program which includes automated exposure control, adjustment of the mA and/or kV according to patient size and/or use of iterative reconstruction technique. CONTRAST:  74m OMNIPAQUE IOHEXOL 350 MG/ML SOLN COMPARISON:  March 03, 2020 FINDINGS: Cardiovascular: There is mild calcification of the aortic arch, without evidence of aortic aneurysm satisfactory opacification of the pulmonary arteries to the segmental level. No evidence of pulmonary embolism. Normal heart size. A coronary artery stent is in place. No pericardial effusion. Mediastinum/Nodes: No enlarged mediastinal, hilar, or axillary lymph nodes. Thyroid gland, trachea, and esophagus demonstrate no significant findings. Lungs/Pleura: Mild atelectasis is seen within the posterior aspects of the bilateral lower lobes. There is no evidence of acute infiltrate, pleural effusion or pneumothorax. Upper Abdomen: A 7.3 cm x 5.5 cm exophytic heterogeneous low-attenuation liver mass is seen within the posterolateral aspect of the right lobe (axial CT images 113 through 131, CT series 5). Musculoskeletal: Extensive patchy, heterogeneous sclerotic areas are seen throughout the osseous skeleton. This is increased in severity when compared to the prior study. Review of the MIP images confirms the above findings. IMPRESSION: 1. No evidence of pulmonary embolism or other acute intrathoracic process. 2. Large liver mass consistent with the patient's history of hepatocellular carcinoma. 3. Findings consistent with extensive osseous metastatic disease. Aortic Atherosclerosis (ICD10-I70.0). Electronically Signed   By: TVirgina NorfolkM.D.   On: 09/16/2021 04:17    EKG: Independently reviewed.  NSR with rate 79; no evidence of acute ischemia   Labs on Admission: I have personally reviewed the  available labs and imaging studies at the time of the admission.  Pertinent labs:    Glucose 116 HS troponin 5, 6 Lactate 1.0 WBC 9.6 Hgb 11.2 - stable Platelets 114 - stable D-dimer 3.34 INR 1.2 Unremarkable UA Blood cultures negative   Assessment and Plan: Principal Problem:   Admission for end of life care Active Problems:   Polysubstance abuse (HCC)   Chest pain   Pro[place] cancer [place] to bone [place]    End of life care -Patient with known metastatic prostate cancer with diffuse osseous mets as well as known multivessel CAD presenting with CP -He did use cocaine and this may be related to his pain, although given the extent of his metastatic disease the cocaine may have just been used to mask his severe pain -Regardless, he has advanced malignancy and after  long discussion in the ER, he has decided to proceed with comfort care only -He will be [facility] to [facility] for comfort care and palliative care consult -Patient is likely to be a candidate for [facility] or other residential hospice, as he does not appear to be actively dying at this time -Comfort care order set utilized -Pain control with morphine drip -Will add Dr. Benay Spice to the care team to make him aware      Advance Care Planning:   Code Status: DNR   Consults: Palliative care; possibly oncology  DVT Prophylaxis: None - comfort measures  Family Communication: None present; we called his ex-wife from the room to update her on the situation (her current husband also has terminal cancer and she is also planning to bring him to the hospital today)  Severity of Illness: The appropriate patient status for this patient is INPATIENT. Inpatient status is judged to be reasonable and necessary in order to provide the required intensity of service to ensure the patient's safety. The patient's presenting symptoms, physical exam findings, and initial radiographic and laboratory data in the context of their chronic  comorbidities is felt to place them at high risk for further clinical deterioration. Furthermore, it is not anticipated that the patient will be medically stable for discharge from the hospital within 2 midnights of admission.   * I certify that at the point of admission it is my clinical judgment that the patient will require inpatient hospital care spanning beyond 2 midnights from the point of admission due to high intensity of service, high risk for further deterioration and high frequency of surveillance required.*  Author: Karmen Bongo, MD 09/16/2021 9:20 AM  For on call review [email or web address].

## 2021-09-16 NOTE — Consult Note (Signed)
ER Consult Note   Patient: Edgar Mooney MGN:[id number] DOB: 01-14-1966 DOA: 09/15/2021 DOS: the patient was seen and examined on 09/16/2021 PCP: Pcp, No  Patient coming from: Home - lives with ex, her husband, his daughter, and a couple of others; NOKClaudie Fisherman, [phone]    Chief Complaint: chest pain  HPI: SHONN FARRUGGIA is a 56 y.o. male with medical history significant of HTN, remote polysubstance abuse, tobacco dependence, metastatic prostate CA, and CAD with h/o stent and recent cath (08/08/21) with multivessel disease and plan for medical management presenting with chest pain.  He was last seen on 5/9 by Dr. Benay Spice for metastatic prostate CA with widespread osseous metastatic disease, on ongoing chemotherapy (docetaxel).  He reports that he is having chest pain all the way through to his back.  His kidneys also feel like they are hurting.  Pain has been there for about 2 days.  It does feel similar to his prior cardiac chest pain.  He is unsure about the status of his prostate cancer.  The cancer is all in his bones.  He is undergoing chemotherapy but is not noticing much improvement.    ER Course:  Carryover, per Dr. Cyd Silence:  56 year old male with past medical history of coronary artery disease with hospitalization 12/2020 with unstable angina status post drug-eluting stents to the LAD and RCA, prostate cancer with extensive metastatic disease with innumerable bony metastases as well as hepatic mass thought to be hepatocellular carcinoma and hepatitis C presenting to the emergency department with intractable atypical sounding chest pain.  CT angiogram of the chest is negative.  No evidence of pneumonia.  Cardiac enzymes unremarkable.  ER provider feels that patient's severe intractable pain is due to his bony metastatic disease.  ER provider requesting hospitalization for pain control and possible hospice discussion/palliative care consultation.  I have asked that a trial of GI  cocktail dose to be administered.      Review of Systems: As mentioned in the history of present illness. All other systems reviewed and are negative. 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   Prostate cancer metastatic to bone [facility])    Past Surgical History:  Procedure Laterality Date   CORONARY STENT INTERVENTION N/A 12/21/2020   Procedure: CORONARY STENT INTERVENTION;  Surgeon: Troy Sine, MD;  Location: [place] [facility];  Service: Cardiovascular;  Laterality: N/A;   CORONARY STENT INTERVENTION N/A 12/22/2020   Procedure: CORONARY STENT INTERVENTION;  Surgeon: Burnell Blanks, MD;  Location: [place] [facility];  Service: Cardiovascular;  Laterality: N/A;   IR 3D INDEPENDENT WKST  02/15/2021   IR ANGIOGRAM SELECTIVE EACH ADDITIONAL VESSEL  02/15/2021   IR ANGIOGRAM SELECTIVE EACH ADDITIONAL VESSEL  02/15/2021   IR ANGIOGRAM SELECTIVE EACH ADDITIONAL VESSEL  06/26/2021   IR ANGIOGRAM SELECTIVE EACH ADDITIONAL VESSEL  06/28/2021   IR ANGIOGRAM VISCERAL SELECTIVE  02/15/2021   IR ANGIOGRAM VISCERAL SELECTIVE  02/15/2021   IR ANGIOGRAM VISCERAL SELECTIVE  06/26/2021   IR EMBO TUMOR ORGAN ISCHEMIA INFARCT INC GUIDE ROADMAPPING  06/26/2021   IR RADIOLOGIST EVAL & MGMT  01/11/2021   IR RADIOLOGIST EVAL & MGMT  07/27/2021   IR  US GUIDE VASC ACCESS RIGHT  02/15/2021   IR US GUIDE VASC ACCESS RIGHT  06/26/2021   LEFT HEART CATH AND CORONARY ANGIOGRAPHY N/A 12/21/2020   Procedure: LEFT HEART CATH AND CORONARY ANGIOGRAPHY;  Surgeon: Troy Sine, MD;  Location: [place] [facility];  Service: Cardiovascular;  Laterality: N/A;   LEFT HEART CATH AND CORONARY ANGIOGRAPHY N/A 08/08/2021   Procedure: LEFT HEART CATH AND CORONARY ANGIOGRAPHY;  Surgeon: Burnell Blanks, MD;  Location: [place] [facility];  Service: Cardiovascular;  Laterality: N/A;   Social History:  reports that he has been smoking cigarettes. He has a 26.00 pack-year smoking history. He has never used smokeless tobacco. He reports that he does not currently use alcohol after a past usage of about 126.0 standard drinks per week. He reports current drug use. Drugs: Cocaine, IV, Heroin, and Marijuana.  No Known Allergies  Family History  Problem Relation Age of Onset   Cancer Mother    Cancer Father     Prior to Admission medications   Medication Sig Start Date End Date Taking? Authorizing Provider  allopurinol (ZYLOPRIM) 100 MG tablet TAKE 1 TABLET BY MOUTH EVERY DAY 07/12/21   Owens Shark, NP  amLODipine (NORVASC) 5 MG tablet Take 1 tablet (5 mg total) by mouth daily. 08/02/21   [name], MD  aspirin 81 MG chewable tablet Chew 1 tablet (81 mg total) by mouth daily. 12/24/20   Charlynne Cousins, MD  isosorbide mononitrate (IMDUR) 30 MG 24 hr tablet Take 0.5 tablets (15 mg total) by mouth daily. 08/02/21   [name], MD  nitroGLYCERIN (NITROSTAT) 0.4 MG SL tablet Place 1 tablet (0.4 mg total) under the tongue every 5 (five) minutes as needed for chest pain. 12/23/20   Charlynne Cousins, MD  oxyCODONE (OXY IR/ROXICODONE) 5 MG immediate release tablet Take 1 tablet (5 mg total) by mouth every 6 (six) hours as needed for severe pain. 09/14/21   Ladell Pier, MD  predniSONE (DELTASONE) 5 MG tablet Take 1 tablet (5 mg total) by mouth 2 (two) times daily with a meal. 05/31/21   Owens Shark, NP  rosuvastatin (CRESTOR) 20 MG tablet Take 1 tablet (20 mg total) by mouth daily. 08/02/21   [name], MD  tamsulosin (FLOMAX) 0.4 MG CAPS capsule Take 1 capsule (0.4 mg total) by mouth daily. 05/11/21   Ladell Pier, MD  ticagrelor (BRILINTA) 90 MG TABS tablet Take 1 tablet (90 mg total) by mouth 2 (two) times daily. 12/23/20   Charlynne Cousins, MD    Physical  Exam: Vitals:   09/16/21 0330 09/16/21 0515 09/16/21 0545 09/16/21 0700  BP: 107/62  102/66 114/67  Pulse: 72 75 74 75  Resp: (!) 24 (!) 21 20 (!) 23  Temp:      TempSrc:      SpO2: 97% 100% 96% 98%  Weight:      Height:       General:  Appears chronically ill, uncomfortable Eyes:  PERRL, EOMI, normal lids, iris ENT:  grossly normal hearing, lips & tongue, mmm; edentulous Neck:  no LAD, masses or thyromegaly Cardiovascular:  RRR, no m/r/g. No LE edema.  Respiratory:   CTA bilaterally with no wheezes/rales/rhonchi.  Normal to mildly increased respiratory effort. Abdomen:  soft, NT, ND Skin:  no rash or induration seen on limited exam Musculoskeletal:  grossly normal tone BUE/BLE, good ROM, no bony abnormality Psychiatric:  blunted mood and affect, speech fluent  and appropriate, AOx3 Neurologic:  CN 2-12 grossly intact, moves all extremities in coordinated fashion   Radiological Exams on Admission: Independently reviewed - see discussion in A/P where applicable  DG Chest 2 View  Result Date: 09/16/2021 CLINICAL DATA:  Chest pain EXAM: CHEST - 2 VIEW COMPARISON:  12/19/2020, CT chest 03/03/2020, chest x-ray 03/03/2020 FINDINGS: Mildly diminished lung volumes. Mild reticular opacity at the bases likely due to chronic disease. Difficult to exclude mild acute ground-glass opacity. Normal cardiac size. No pneumothorax IMPRESSION: 1. Possible patchy ground-glass opacity/pneumonia at the bases superimposed on underlying mild chronic interstitial changes. Electronically Signed   By: Donavan Foil M.D.   On: 09/16/2021 00:52   CT Angio Chest PE W and/or Wo Contrast  Result Date: 09/16/2021 CLINICAL DATA:  History of hepatocellular carcinoma presenting with chest pain and elevated D-dimer. EXAM: CT ANGIOGRAPHY CHEST WITH CONTRAST TECHNIQUE: Multidetector CT imaging of the chest was performed using the standard protocol during bolus administration of intravenous contrast. Multiplanar CT image  reconstructions and MIPs were obtained to evaluate the vascular anatomy. RADIATION DOSE REDUCTION: This exam was performed according to the departmental dose-optimization program which includes automated exposure control, adjustment of the mA and/or kV according to patient size and/or use of iterative reconstruction technique. CONTRAST:  11mL OMNIPAQUE IOHEXOL 350 MG/ML SOLN COMPARISON:  March 03, 2020 FINDINGS: Cardiovascular: There is mild calcification of the aortic arch, without evidence of aortic aneurysm satisfactory opacification of the pulmonary arteries to the segmental level. No evidence of pulmonary embolism. Normal heart size. A coronary artery stent is in place. No pericardial effusion. Mediastinum/Nodes: No enlarged mediastinal, hilar, or axillary lymph nodes. Thyroid gland, trachea, and esophagus demonstrate no significant findings. Lungs/Pleura: Mild atelectasis is seen within the posterior aspects of the bilateral lower lobes. There is no evidence of acute infiltrate, pleural effusion or pneumothorax. Upper Abdomen: A 7.3 cm x 5.5 cm exophytic heterogeneous low-attenuation liver mass is seen within the posterolateral aspect of the right lobe (axial CT images 113 through 131, CT series 5). Musculoskeletal: Extensive patchy, heterogeneous sclerotic areas are seen throughout the osseous skeleton. This is increased in severity when compared to the prior study. Review of the MIP images confirms the above findings. IMPRESSION: 1. No evidence of pulmonary embolism or other acute intrathoracic process. 2. Large liver mass consistent with the patient's history of hepatocellular carcinoma. 3. Findings consistent with extensive osseous metastatic disease. Aortic Atherosclerosis (ICD10-I70.0). Electronically Signed   By: Virgina Norfolk M.D.   On: 09/16/2021 04:17    EKG: Independently reviewed.  NSR with rate 79; no evidence of acute ischemia   Labs on Admission: I have personally reviewed the  available labs and imaging studies at the time of the admission.  Pertinent labs:    Glucose 116 HS troponin 5, 6 Lactate 1.0 WBC 9.6 Hgb 11.2 - stable Platelets 114 - stable D-dimer 3.34 INR 1.2 Unremarkable UA Blood cultures negative   Assessment and Plan: Principal Problem:   Admission for end of life care Active Problems:   Polysubstance abuse (HCC)   Chest pain   Prostate cancer metastatic to bone ([place])    End of life care -Patient with known metastatic prostate cancer with diffuse osseous mets as well as known multivessel CAD presenting with CP -He did use cocaine and this may be related to his pain, although given the extent of his metastatic disease the cocaine may have just been used to mask his severe pain -Regardless, he has advanced malignancy and after long  discussion in the ER, he has decided to proceed with comfort care only -He will be admitted to [facility] for comfort care and palliative care consult -Patient is likely to be a candidate for [facility] or other residential hospice, as he does not appear to be actively dying at this time -Comfort care order set utilized -Pain control with morphine drip -Will add Dr. Benay Spice to the care team to make him aware  *Update: Patient will be transferred to [facility].    Advance Care Planning:   Code Status: DNR   Consults: Palliative care; possibly oncology  DVT Prophylaxis: None - comfort measures  Family Communication: None present; we called his ex-wife from the room to update her on the situation (her current husband also has terminal cancer and she is also planning to bring him to the hospital today)    Author: Karmen Bongo, MD 09/16/2021 9:20 AM  For on call review [email or web address].

## 2021-09-16 NOTE — ED Notes (Signed)
This NT was told to offer fluids to the patient and asked the patient what he would like. As requested I gave the patient water and ice.

## 2021-09-16 NOTE — ED Notes (Signed)
Called to room by pt who states that he cannot breathe due to the pain.  Pt is on phone with family member at this time.  VSS, pt talking in complete uninterrupted sentences.  Will administer medications that were held earlier.  Will discuss with rounding MD.

## 2021-09-16 NOTE — TOC Transition Note (Signed)
Transition of Care [facility]) - CM/SW Discharge Note   Patient Details  Name: Edgar Mooney MRN: [id number] Date of Birth: Sep 01, 1965  [facility]) CM/SW Contact:  Verdell Carmine, RN Phone Number: 09/16/2021, 5:46 PM   Clinical Narrative:     Consents signed by son, PTAR called will be transported to [facility] place. Medical necessity done, packet ready for transport  Final next level of care: [place] Barriers to Discharge: ED No Barriers   Patient Goals and CMS Choice        Discharge Placement                       Discharge Plan and Services   Discharge Planning Services: CM Consult                                 Social Determinants of Health (SDOH) Interventions     Readmission Risk Interventions     View : No data to display.

## 2021-09-17 DIAGNOSIS — F32A Depression, unspecified: Secondary | ICD-10-CM | POA: Diagnosis not present

## 2021-09-17 DIAGNOSIS — R160 Hepatomegaly, not elsewhere classified: Secondary | ICD-10-CM | POA: Diagnosis not present

## 2021-09-17 DIAGNOSIS — F1110 Opioid abuse, uncomplicated: Secondary | ICD-10-CM | POA: Diagnosis not present

## 2021-09-17 DIAGNOSIS — F1410 Cocaine abuse, uncomplicated: Secondary | ICD-10-CM | POA: Diagnosis not present

## 2021-09-17 DIAGNOSIS — C7951 Secondary malignant neoplasm of bone: Secondary | ICD-10-CM | POA: Diagnosis not present

## 2021-09-17 DIAGNOSIS — E785 Hyperlipidemia, unspecified: Secondary | ICD-10-CM | POA: Diagnosis not present

## 2021-09-17 DIAGNOSIS — C61 Malignant neoplasm of prostate: Secondary | ICD-10-CM | POA: Diagnosis not present

## 2021-09-17 DIAGNOSIS — F17210 Nicotine dependence, cigarettes, uncomplicated: Secondary | ICD-10-CM | POA: Diagnosis not present

## 2021-09-17 DIAGNOSIS — G893 Neoplasm related pain (acute) (chronic): Secondary | ICD-10-CM | POA: Diagnosis not present

## 2021-09-17 DIAGNOSIS — I25110 Atherosclerotic heart disease of native coronary artery with unstable angina pectoris: Secondary | ICD-10-CM | POA: Diagnosis not present

## 2021-09-17 DIAGNOSIS — B1920 Unspecified viral hepatitis C without hepatic coma: Secondary | ICD-10-CM | POA: Diagnosis not present

## 2021-09-17 DIAGNOSIS — M109 Gout, unspecified: Secondary | ICD-10-CM | POA: Diagnosis not present

## 2021-09-17 LAB — BLOOD CULTURE ID PANEL (REFLEXED) - BCID2

## 2021-09-18 ENCOUNTER — Encounter: Payer: Self-pay | Admitting: *Deleted

## 2021-09-18 ENCOUNTER — Inpatient Hospital Stay: Payer: BC Managed Care – PPO

## 2021-09-18 ENCOUNTER — Inpatient Hospital Stay: Payer: BC Managed Care – PPO | Admitting: Oncology

## 2021-09-18 DIAGNOSIS — G893 Neoplasm related pain (acute) (chronic): Secondary | ICD-10-CM | POA: Diagnosis not present

## 2021-09-18 DIAGNOSIS — F1410 Cocaine abuse, uncomplicated: Secondary | ICD-10-CM | POA: Diagnosis not present

## 2021-09-18 DIAGNOSIS — E785 Hyperlipidemia, unspecified: Secondary | ICD-10-CM | POA: Diagnosis not present

## 2021-09-18 DIAGNOSIS — B1920 Unspecified viral hepatitis C without hepatic coma: Secondary | ICD-10-CM | POA: Diagnosis not present

## 2021-09-18 DIAGNOSIS — M109 Gout, unspecified: Secondary | ICD-10-CM | POA: Diagnosis not present

## 2021-09-18 DIAGNOSIS — C61 Malignant neoplasm of prostate: Secondary | ICD-10-CM | POA: Diagnosis not present

## 2021-09-18 DIAGNOSIS — F17210 Nicotine dependence, cigarettes, uncomplicated: Secondary | ICD-10-CM | POA: Diagnosis not present

## 2021-09-18 DIAGNOSIS — C7951 Secondary malignant neoplasm of bone: Secondary | ICD-10-CM | POA: Diagnosis not present

## 2021-09-18 DIAGNOSIS — R160 Hepatomegaly, not elsewhere classified: Secondary | ICD-10-CM | POA: Diagnosis not present

## 2021-09-18 DIAGNOSIS — F32A Depression, unspecified: Secondary | ICD-10-CM | POA: Diagnosis not present

## 2021-09-18 DIAGNOSIS — I25110 Atherosclerotic heart disease of native coronary artery with unstable angina pectoris: Secondary | ICD-10-CM | POA: Diagnosis not present

## 2021-09-18 DIAGNOSIS — F1110 Opioid abuse, uncomplicated: Secondary | ICD-10-CM | POA: Diagnosis not present

## 2021-09-18 NOTE — Progress Notes (Signed)
Spoke w/nurse Tiffany at [facility]: Edgar Mooney seems more comfortable on morphine drip. He is arrousable and ate few bites of ice cream today when fed. He is not able to feed himself. Is bed bound and incontinent of bowel and bladder. Made her aware that Dr. Benay Spice will try to see him tomorrow.

## 2021-09-19 DIAGNOSIS — M109 Gout, unspecified: Secondary | ICD-10-CM | POA: Diagnosis not present

## 2021-09-19 DIAGNOSIS — B1920 Unspecified viral hepatitis C without hepatic coma: Secondary | ICD-10-CM | POA: Diagnosis not present

## 2021-09-19 DIAGNOSIS — E785 Hyperlipidemia, unspecified: Secondary | ICD-10-CM | POA: Diagnosis not present

## 2021-09-19 DIAGNOSIS — C7951 Secondary malignant neoplasm of bone: Secondary | ICD-10-CM | POA: Diagnosis not present

## 2021-09-19 DIAGNOSIS — I25110 Atherosclerotic heart disease of native coronary artery with unstable angina pectoris: Secondary | ICD-10-CM | POA: Diagnosis not present

## 2021-09-19 DIAGNOSIS — F32A Depression, unspecified: Secondary | ICD-10-CM | POA: Diagnosis not present

## 2021-09-19 DIAGNOSIS — C61 Malignant neoplasm of prostate: Secondary | ICD-10-CM | POA: Diagnosis not present

## 2021-09-19 DIAGNOSIS — G893 Neoplasm related pain (acute) (chronic): Secondary | ICD-10-CM | POA: Diagnosis not present

## 2021-09-19 DIAGNOSIS — F17210 Nicotine dependence, cigarettes, uncomplicated: Secondary | ICD-10-CM | POA: Diagnosis not present

## 2021-09-19 DIAGNOSIS — F1110 Opioid abuse, uncomplicated: Secondary | ICD-10-CM | POA: Diagnosis not present

## 2021-09-19 DIAGNOSIS — F1410 Cocaine abuse, uncomplicated: Secondary | ICD-10-CM | POA: Diagnosis not present

## 2021-09-19 DIAGNOSIS — R160 Hepatomegaly, not elsewhere classified: Secondary | ICD-10-CM | POA: Diagnosis not present

## 2021-09-19 LAB — CULTURE, BLOOD (ROUTINE X 2): Special Requests: ADEQUATE

## 2021-09-20 ENCOUNTER — Inpatient Hospital Stay: Payer: BC Managed Care – PPO

## 2021-09-20 ENCOUNTER — Encounter: Payer: Self-pay | Admitting: Oncology

## 2021-09-20 ENCOUNTER — Telehealth: Payer: Self-pay

## 2021-09-20 DIAGNOSIS — F1410 Cocaine abuse, uncomplicated: Secondary | ICD-10-CM | POA: Diagnosis not present

## 2021-09-20 DIAGNOSIS — F32A Depression, unspecified: Secondary | ICD-10-CM | POA: Diagnosis not present

## 2021-09-20 DIAGNOSIS — C61 Malignant neoplasm of prostate: Secondary | ICD-10-CM | POA: Diagnosis not present

## 2021-09-20 DIAGNOSIS — R160 Hepatomegaly, not elsewhere classified: Secondary | ICD-10-CM | POA: Diagnosis not present

## 2021-09-20 DIAGNOSIS — G893 Neoplasm related pain (acute) (chronic): Secondary | ICD-10-CM | POA: Diagnosis not present

## 2021-09-20 DIAGNOSIS — B1920 Unspecified viral hepatitis C without hepatic coma: Secondary | ICD-10-CM | POA: Diagnosis not present

## 2021-09-20 DIAGNOSIS — E785 Hyperlipidemia, unspecified: Secondary | ICD-10-CM | POA: Diagnosis not present

## 2021-09-20 DIAGNOSIS — I25110 Atherosclerotic heart disease of native coronary artery with unstable angina pectoris: Secondary | ICD-10-CM | POA: Diagnosis not present

## 2021-09-20 DIAGNOSIS — F17210 Nicotine dependence, cigarettes, uncomplicated: Secondary | ICD-10-CM | POA: Diagnosis not present

## 2021-09-20 DIAGNOSIS — M109 Gout, unspecified: Secondary | ICD-10-CM | POA: Diagnosis not present

## 2021-09-20 DIAGNOSIS — C7951 Secondary malignant neoplasm of bone: Secondary | ICD-10-CM | POA: Diagnosis not present

## 2021-09-20 DIAGNOSIS — F1110 Opioid abuse, uncomplicated: Secondary | ICD-10-CM | POA: Diagnosis not present

## 2021-09-20 NOTE — Telephone Encounter (Signed)
Edgar Mooney at [facility] called with an updated for the patient. Edgar Mooney stated the patient is in excruciating pain and he is trying to get up several time. He is very confuse. The nurse requested an increase of his ativan 0.5 mg and roxanol 5 mg.  Dr Benay Spice spoke with Edgar Mooney  and he is d/c the haloperidol, increase the ativan to 1 mg every 6 hour PRN and Roxanol '5mg'$  to 5-10 mg. Edgar Mooney gave verbal understanding and had no further questions or concerns.

## 2021-09-21 ENCOUNTER — Inpatient Hospital Stay (HOSPITAL_BASED_OUTPATIENT_CLINIC_OR_DEPARTMENT_OTHER): Payer: BC Managed Care – PPO | Admitting: Oncology

## 2021-09-21 ENCOUNTER — Other Ambulatory Visit: Payer: Self-pay | Admitting: Nurse Practitioner

## 2021-09-21 ENCOUNTER — Other Ambulatory Visit (HOSPITAL_BASED_OUTPATIENT_CLINIC_OR_DEPARTMENT_OTHER): Payer: Self-pay

## 2021-09-21 ENCOUNTER — Telehealth: Payer: Self-pay | Admitting: *Deleted

## 2021-09-21 VITALS — BP 132/78 | HR 74 | Resp 20

## 2021-09-21 DIAGNOSIS — D696 Thrombocytopenia, unspecified: Secondary | ICD-10-CM | POA: Diagnosis not present

## 2021-09-21 DIAGNOSIS — C61 Malignant neoplasm of prostate: Secondary | ICD-10-CM | POA: Diagnosis not present

## 2021-09-21 DIAGNOSIS — K7460 Unspecified cirrhosis of liver: Secondary | ICD-10-CM | POA: Diagnosis not present

## 2021-09-21 DIAGNOSIS — F1911 Other psychoactive substance abuse, in remission: Secondary | ICD-10-CM | POA: Diagnosis not present

## 2021-09-21 DIAGNOSIS — R200 Anesthesia of skin: Secondary | ICD-10-CM | POA: Diagnosis not present

## 2021-09-21 DIAGNOSIS — F17200 Nicotine dependence, unspecified, uncomplicated: Secondary | ICD-10-CM | POA: Diagnosis not present

## 2021-09-21 DIAGNOSIS — F17210 Nicotine dependence, cigarettes, uncomplicated: Secondary | ICD-10-CM | POA: Diagnosis not present

## 2021-09-21 DIAGNOSIS — F1410 Cocaine abuse, uncomplicated: Secondary | ICD-10-CM | POA: Diagnosis not present

## 2021-09-21 DIAGNOSIS — R160 Hepatomegaly, not elsewhere classified: Secondary | ICD-10-CM | POA: Diagnosis not present

## 2021-09-21 DIAGNOSIS — B1920 Unspecified viral hepatitis C without hepatic coma: Secondary | ICD-10-CM | POA: Diagnosis not present

## 2021-09-21 DIAGNOSIS — E785 Hyperlipidemia, unspecified: Secondary | ICD-10-CM | POA: Diagnosis not present

## 2021-09-21 DIAGNOSIS — M109 Gout, unspecified: Secondary | ICD-10-CM | POA: Diagnosis not present

## 2021-09-21 DIAGNOSIS — F32A Depression, unspecified: Secondary | ICD-10-CM | POA: Diagnosis not present

## 2021-09-21 DIAGNOSIS — G893 Neoplasm related pain (acute) (chronic): Secondary | ICD-10-CM | POA: Diagnosis not present

## 2021-09-21 DIAGNOSIS — Z5111 Encounter for antineoplastic chemotherapy: Secondary | ICD-10-CM | POA: Diagnosis not present

## 2021-09-21 DIAGNOSIS — D649 Anemia, unspecified: Secondary | ICD-10-CM | POA: Diagnosis not present

## 2021-09-21 DIAGNOSIS — Z8619 Personal history of other infectious and parasitic diseases: Secondary | ICD-10-CM | POA: Diagnosis not present

## 2021-09-21 DIAGNOSIS — I25110 Atherosclerotic heart disease of native coronary artery with unstable angina pectoris: Secondary | ICD-10-CM | POA: Diagnosis not present

## 2021-09-21 DIAGNOSIS — R162 Hepatomegaly with splenomegaly, not elsewhere classified: Secondary | ICD-10-CM | POA: Diagnosis not present

## 2021-09-21 DIAGNOSIS — C220 Liver cell carcinoma: Secondary | ICD-10-CM | POA: Diagnosis not present

## 2021-09-21 DIAGNOSIS — I252 Old myocardial infarction: Secondary | ICD-10-CM | POA: Diagnosis not present

## 2021-09-21 DIAGNOSIS — C7951 Secondary malignant neoplasm of bone: Secondary | ICD-10-CM | POA: Diagnosis not present

## 2021-09-21 DIAGNOSIS — F1110 Opioid abuse, uncomplicated: Secondary | ICD-10-CM | POA: Diagnosis not present

## 2021-09-21 LAB — CULTURE, BLOOD (ROUTINE X 2): Culture: NO GROWTH

## 2021-09-21 NOTE — Telephone Encounter (Signed)
Per Edgar Mooney at [facility], [name] son, Edgar Mooney has agreed to take him to live with him. Working on [employer] delivered to home. Asking for oxycodone script for home. After confirming with pharmacy, he picked up #60 oxycodone on 09/14/21. Patient/son do not know where the medication is. Informed Edgar Mooney that they need to f/u with people he was living with prior to admission to see where it is. If not able to find it, he needs to file a police report. Added son to his contact list and was informed that he does not have voice mail.

## 2021-09-21 NOTE — Progress Notes (Signed)
[place] OFFICE PROGRESS NOTE   Diagnosis: Prostate cancer, hepatocellular carcinoma  INTERVAL HISTORY:   Edgar. Ferrell has metastatic prostate cancer.  He was last treated with docetaxel on 08/22/2021.  He was scheduled for cycle 6 dose of Taxol last week, but missed the appointment due to lack of transportation. He presented to the emergency room on 09/16/2021 with chest pain.  A cardiac valuation was negative.  A CT of the chest revealed no evidence of pulmonary embolism. A liver mass was noted.  Extensive sclerotic lesions were noted throughout the skeleton.  This was increased compared to a CT from November 2021.  He was seen in consultation by the medical service and palliative care.  Comfort care measures were initiated.  He was placed on a morphine drip and scheduled Ativan.  He was transferred to [place] saw Edgar Mooney at [place] on the morning of 09/19/2021.  He was obtunded while on a morphine drip and Ativan.  His family was at the bedside.  We discussed the acute change in his status.  His family report he was at baseline prior to developing chest pain on 09/16/2021.  His family felt Edgar. Bargo would not want to be placed on a comfort/terminal care protocol.  I contacted the hospice medical director.  We discontinue the morphine drip and scheduled Ativan.  I saw Edgar Mooney at [facility] again this morning. Objective:  Vital signs in last 24 hours:  Blood pressure 132/78, pulse 74, resp. rate 20, SpO2 95 %.    HEENT: No thrush Resp: Scattered end inspiratory rhonchi at the posterior chest, no respiratory distress Cardio: Regular rate and rhythm GI: Nontender, no hepatosplenomegaly Vascular: No leg edema Neuro: Mildly lethargic, follows commands, moves all extremities to command, responds appropriately    Lab Results:  Lab Results  Component Value Date   WBC 9.6 09/16/2021   HGB 11.2 (L) 09/16/2021   HCT 33.3 (L) 09/16/2021   MCV 93.0  09/16/2021   PLT 114 (L) 09/16/2021   NEUTROABS 6.9 08/22/2021    CMP  Lab Results  Component Value Date   NA 136 09/16/2021   K 3.9 09/16/2021   CL 106 09/16/2021   CO2 21 (L) 09/16/2021   GLUCOSE 116 (H) 09/16/2021   BUN 12 09/16/2021   CREATININE 0.85 09/16/2021   CALCIUM 9.1 09/16/2021   PROT 6.5 08/22/2021   ALBUMIN 4.2 08/22/2021   AST 16 08/22/2021   ALT 14 08/22/2021   ALKPHOS 147 (H) 08/22/2021   BILITOT 0.7 08/22/2021   GFRNONAA >60 09/16/2021   GFRAA 121 12/19/2017    Medications: I have reviewed the patient's current medications.   Assessment/Plan: Metastatic prostate cancer - Lytic bone lesions with retroperitoneal adenopathy concerning for metastatic prostate cancer -03/03/2020-CTA chest/abdomen/pelvis widespread osseous metastatic disease and lower retroperitoneal adenopathy, pathologic right anterior third rib fracture, probable spinal canal tumor at the level of S1, -MRI cervical, thoracic, and lumbar spine 03/03/2020-diffuse osseous metastatic disease, ventral epidural tumor impinging on right S1 nerve root, extraosseous tumor at the bilateral ilium, asymmetric enhancing material at the right C5-6 canal-right  -03/03/2020-PSA 763 -Degarelix 03/04/2020 -Abiraterone/prednisone 03/14/2020 -Every 95-month Lupron 04/01/2020 -04/01/2020 PSA 36.5 -05/31/2020 PSA 1.2 -06/28/2020 PSA 1.0 -12/14/2020 PSA 6.4 -01/13/2021 PSA 10.6 -04/26/2021 PSA  45.2 -05/16/2021 PSA 70 -Abiraterone/prednisone discontinued 05/16/2021 -05/16/2021 left hip x-ray-multifocal sclerotic metastasis throughout the pelvis, confluent involving the left acetabulum.  No acute or pathologic fracture.  Left hip osteoarthritis. -Cycle 1 docetaxel 05/31/2021 -05/31/2021  PSA 113 -Palliative radiation to the left acetabulum 06/08/2021 - 06/20/2021 -Cycle 2 docetaxel 06/21/2021 -06/21/2021 PSA improved at 68 -Cycle 3 docetaxel 07/12/2021 -07/12/2021 PSA improved at 52 -Cycle 4 docetaxel 08/02/2021 -Cycle 5  docetaxel 08/22/2021   2.  Severe anemia-likely secondary to metastatic prostate cancer involving the bones 3.  Mild thrombocytopenia 4.  Cirrhosis with splenomegaly 5.  History of hepatitis C 6.  History of polysubstance abuse 7.  Depression 8.  Tobacco dependence 9.  Right arm weakness, right facial numbness-potentially related to nerve root compromise from metastatic bone lesions 10.  Pain secondary to #1 11.  Extensive bone metastases-every 61-month Zometa starting 04/01/2020 12.  Gout-acute flare right wrist 04/01/2020 treated with indomethacin, allopurinol resumed 13.  Right hepatic lobe mass with elevated AFP concerning for [facility] MRI abdomen 12/21/2020-hypoenhancing inferior right liver mass, segment 6, occluding or compressing the adjacent right portal vein branch, intrahepatic cholangiocarcinoma favored with differential including hepatocellular carcinoma, Li-Rads M Y 90 planning study 11-22 Y90 right lobe of liver 06/26/2021 14.  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  Presentation to the [facility] emergency room 09/16/2021 with chest pain-transfer to [facility] on a morphine drip and scheduled Ativan       Disposition: Edgar Mooney has metastatic prostate cancer, currently being treated with docetaxel.  His clinical status has been stable while on docetaxel with stabilization of the elevated PSA.  I saw Edgar Mooney on 09/19/2021 and again today.  When I saw him today he was alert and oriented.  He indicated he would like to continue treatment of the cancer as opposed to a comfort care approach.  He has been taking oxycodone as needed for pain.  This will be continued.  I discussed the situation with the nursing staff at [facility] and with the hospice medical director, Dr. Konrad Dolores.  The Foley cath will be  discontinued.  He will get up to the chair and ambulate.  He can be discharged to home later today if he remains stable.  Outpatient follow-up will be scheduled at the Cancer center.  We will discuss continuation of docetaxel versus switching to a different systemic regimen.  Treatment options include a PARP inhibitor given the pathogenic ATM mutation.  We can also consider a palliative course of Decadron.  I encouraged him to not use alcohol or illicit drugs.  I spent approximately 2 hours visiting Edgar Mooney, reviewing the medical record, discussing his case with the hospice medical director, and coordinating care.  Betsy Coder, MD  09/21/2021  3:20 PM

## 2021-09-21 NOTE — Progress Notes (Signed)
V/S taken at [facility] and called in to nurse to document.

## 2021-09-22 ENCOUNTER — Telehealth: Payer: Self-pay

## 2021-09-22 ENCOUNTER — Encounter: Payer: Self-pay | Admitting: *Deleted

## 2021-09-22 DIAGNOSIS — C7951 Secondary malignant neoplasm of bone: Secondary | ICD-10-CM | POA: Diagnosis not present

## 2021-09-22 DIAGNOSIS — M109 Gout, unspecified: Secondary | ICD-10-CM | POA: Diagnosis not present

## 2021-09-22 DIAGNOSIS — Z743 Need for continuous supervision: Secondary | ICD-10-CM | POA: Diagnosis not present

## 2021-09-22 DIAGNOSIS — R531 Weakness: Secondary | ICD-10-CM | POA: Diagnosis not present

## 2021-09-22 DIAGNOSIS — F1110 Opioid abuse, uncomplicated: Secondary | ICD-10-CM | POA: Diagnosis not present

## 2021-09-22 DIAGNOSIS — F1410 Cocaine abuse, uncomplicated: Secondary | ICD-10-CM | POA: Diagnosis not present

## 2021-09-22 DIAGNOSIS — I25110 Atherosclerotic heart disease of native coronary artery with unstable angina pectoris: Secondary | ICD-10-CM | POA: Diagnosis not present

## 2021-09-22 DIAGNOSIS — R160 Hepatomegaly, not elsewhere classified: Secondary | ICD-10-CM | POA: Diagnosis not present

## 2021-09-22 DIAGNOSIS — G893 Neoplasm related pain (acute) (chronic): Secondary | ICD-10-CM | POA: Diagnosis not present

## 2021-09-22 DIAGNOSIS — B1920 Unspecified viral hepatitis C without hepatic coma: Secondary | ICD-10-CM | POA: Diagnosis not present

## 2021-09-22 DIAGNOSIS — E785 Hyperlipidemia, unspecified: Secondary | ICD-10-CM | POA: Diagnosis not present

## 2021-09-22 DIAGNOSIS — C61 Malignant neoplasm of prostate: Secondary | ICD-10-CM | POA: Diagnosis not present

## 2021-09-22 DIAGNOSIS — F17210 Nicotine dependence, cigarettes, uncomplicated: Secondary | ICD-10-CM | POA: Diagnosis not present

## 2021-09-22 DIAGNOSIS — F32A Depression, unspecified: Secondary | ICD-10-CM | POA: Diagnosis not present

## 2021-09-22 NOTE — Telephone Encounter (Signed)
TC to Pt per Dr Benay Spice to see if Pt could come in for an appointment on Monday. Spoke with Pt's wife who stated Pt could come in to appointment at [phone]. Gave Pt's wife transportation number to schedule ride for Monday.

## 2021-09-22 NOTE — Progress Notes (Signed)
Notified by Helene Kelp at [facility] that Edgar Mooney will be discharging from [facility] today to stay with his son, Partick. Son reports he has not been able to locate oxycodone that was refilled on 09/14/21 (hospital admission on 6/3) at the prior home he was living. Nurse informed him again that if narcotic is missing, he needs to file a police report to justify need to refill again at this time.

## 2021-09-23 DIAGNOSIS — F1110 Opioid abuse, uncomplicated: Secondary | ICD-10-CM | POA: Diagnosis not present

## 2021-09-23 DIAGNOSIS — G893 Neoplasm related pain (acute) (chronic): Secondary | ICD-10-CM | POA: Diagnosis not present

## 2021-09-23 DIAGNOSIS — I25110 Atherosclerotic heart disease of native coronary artery with unstable angina pectoris: Secondary | ICD-10-CM | POA: Diagnosis not present

## 2021-09-23 DIAGNOSIS — R160 Hepatomegaly, not elsewhere classified: Secondary | ICD-10-CM | POA: Diagnosis not present

## 2021-09-23 DIAGNOSIS — F32A Depression, unspecified: Secondary | ICD-10-CM | POA: Diagnosis not present

## 2021-09-23 DIAGNOSIS — E785 Hyperlipidemia, unspecified: Secondary | ICD-10-CM | POA: Diagnosis not present

## 2021-09-23 DIAGNOSIS — C61 Malignant neoplasm of prostate: Secondary | ICD-10-CM | POA: Diagnosis not present

## 2021-09-23 DIAGNOSIS — F17210 Nicotine dependence, cigarettes, uncomplicated: Secondary | ICD-10-CM | POA: Diagnosis not present

## 2021-09-23 DIAGNOSIS — M109 Gout, unspecified: Secondary | ICD-10-CM | POA: Diagnosis not present

## 2021-09-23 DIAGNOSIS — C7951 Secondary malignant neoplasm of bone: Secondary | ICD-10-CM | POA: Diagnosis not present

## 2021-09-23 DIAGNOSIS — B1920 Unspecified viral hepatitis C without hepatic coma: Secondary | ICD-10-CM | POA: Diagnosis not present

## 2021-09-23 DIAGNOSIS — F1410 Cocaine abuse, uncomplicated: Secondary | ICD-10-CM | POA: Diagnosis not present

## 2021-09-24 DIAGNOSIS — F32A Depression, unspecified: Secondary | ICD-10-CM | POA: Diagnosis not present

## 2021-09-24 DIAGNOSIS — B1920 Unspecified viral hepatitis C without hepatic coma: Secondary | ICD-10-CM | POA: Diagnosis not present

## 2021-09-24 DIAGNOSIS — C61 Malignant neoplasm of prostate: Secondary | ICD-10-CM | POA: Diagnosis not present

## 2021-09-24 DIAGNOSIS — F1110 Opioid abuse, uncomplicated: Secondary | ICD-10-CM | POA: Diagnosis not present

## 2021-09-24 DIAGNOSIS — C7951 Secondary malignant neoplasm of bone: Secondary | ICD-10-CM | POA: Diagnosis not present

## 2021-09-24 DIAGNOSIS — F17210 Nicotine dependence, cigarettes, uncomplicated: Secondary | ICD-10-CM | POA: Diagnosis not present

## 2021-09-24 DIAGNOSIS — E785 Hyperlipidemia, unspecified: Secondary | ICD-10-CM | POA: Diagnosis not present

## 2021-09-24 DIAGNOSIS — M109 Gout, unspecified: Secondary | ICD-10-CM | POA: Diagnosis not present

## 2021-09-24 DIAGNOSIS — R160 Hepatomegaly, not elsewhere classified: Secondary | ICD-10-CM | POA: Diagnosis not present

## 2021-09-24 DIAGNOSIS — G893 Neoplasm related pain (acute) (chronic): Secondary | ICD-10-CM | POA: Diagnosis not present

## 2021-09-24 DIAGNOSIS — F1410 Cocaine abuse, uncomplicated: Secondary | ICD-10-CM | POA: Diagnosis not present

## 2021-09-24 DIAGNOSIS — I25110 Atherosclerotic heart disease of native coronary artery with unstable angina pectoris: Secondary | ICD-10-CM | POA: Diagnosis not present

## 2021-09-25 ENCOUNTER — Ambulatory Visit (HOSPITAL_COMMUNITY): Admission: RE | Admit: 2021-09-25 | Payer: BC Managed Care – PPO | Source: Ambulatory Visit

## 2021-09-25 ENCOUNTER — Encounter: Payer: Self-pay | Admitting: Nurse Practitioner

## 2021-09-25 ENCOUNTER — Inpatient Hospital Stay (HOSPITAL_BASED_OUTPATIENT_CLINIC_OR_DEPARTMENT_OTHER): Payer: BC Managed Care – PPO | Admitting: Nurse Practitioner

## 2021-09-25 ENCOUNTER — Inpatient Hospital Stay: Payer: BC Managed Care – PPO

## 2021-09-25 VITALS — BP 142/75 | HR 89 | Temp 98.2°F | Resp 18 | Wt 151.0 lb

## 2021-09-25 DIAGNOSIS — R162 Hepatomegaly with splenomegaly, not elsewhere classified: Secondary | ICD-10-CM | POA: Diagnosis not present

## 2021-09-25 DIAGNOSIS — R160 Hepatomegaly, not elsewhere classified: Secondary | ICD-10-CM | POA: Diagnosis not present

## 2021-09-25 DIAGNOSIS — D649 Anemia, unspecified: Secondary | ICD-10-CM | POA: Diagnosis not present

## 2021-09-25 DIAGNOSIS — G893 Neoplasm related pain (acute) (chronic): Secondary | ICD-10-CM | POA: Diagnosis not present

## 2021-09-25 DIAGNOSIS — C61 Malignant neoplasm of prostate: Secondary | ICD-10-CM | POA: Diagnosis not present

## 2021-09-25 DIAGNOSIS — Z5111 Encounter for antineoplastic chemotherapy: Secondary | ICD-10-CM | POA: Diagnosis not present

## 2021-09-25 DIAGNOSIS — M109 Gout, unspecified: Secondary | ICD-10-CM | POA: Diagnosis not present

## 2021-09-25 DIAGNOSIS — B1920 Unspecified viral hepatitis C without hepatic coma: Secondary | ICD-10-CM | POA: Diagnosis not present

## 2021-09-25 DIAGNOSIS — I252 Old myocardial infarction: Secondary | ICD-10-CM | POA: Diagnosis not present

## 2021-09-25 DIAGNOSIS — F1110 Opioid abuse, uncomplicated: Secondary | ICD-10-CM | POA: Diagnosis not present

## 2021-09-25 DIAGNOSIS — F17200 Nicotine dependence, unspecified, uncomplicated: Secondary | ICD-10-CM | POA: Diagnosis not present

## 2021-09-25 DIAGNOSIS — C7951 Secondary malignant neoplasm of bone: Secondary | ICD-10-CM | POA: Diagnosis not present

## 2021-09-25 DIAGNOSIS — C220 Liver cell carcinoma: Secondary | ICD-10-CM | POA: Diagnosis not present

## 2021-09-25 DIAGNOSIS — D696 Thrombocytopenia, unspecified: Secondary | ICD-10-CM | POA: Diagnosis not present

## 2021-09-25 DIAGNOSIS — F1410 Cocaine abuse, uncomplicated: Secondary | ICD-10-CM | POA: Diagnosis not present

## 2021-09-25 DIAGNOSIS — I25110 Atherosclerotic heart disease of native coronary artery with unstable angina pectoris: Secondary | ICD-10-CM | POA: Diagnosis not present

## 2021-09-25 DIAGNOSIS — K7460 Unspecified cirrhosis of liver: Secondary | ICD-10-CM | POA: Diagnosis not present

## 2021-09-25 DIAGNOSIS — F1911 Other psychoactive substance abuse, in remission: Secondary | ICD-10-CM | POA: Diagnosis not present

## 2021-09-25 DIAGNOSIS — R200 Anesthesia of skin: Secondary | ICD-10-CM | POA: Diagnosis not present

## 2021-09-25 DIAGNOSIS — F17210 Nicotine dependence, cigarettes, uncomplicated: Secondary | ICD-10-CM | POA: Diagnosis not present

## 2021-09-25 DIAGNOSIS — Z8619 Personal history of other infectious and parasitic diseases: Secondary | ICD-10-CM | POA: Diagnosis not present

## 2021-09-25 DIAGNOSIS — E785 Hyperlipidemia, unspecified: Secondary | ICD-10-CM | POA: Diagnosis not present

## 2021-09-25 DIAGNOSIS — F32A Depression, unspecified: Secondary | ICD-10-CM | POA: Diagnosis not present

## 2021-09-25 NOTE — Progress Notes (Signed)
Edgar Mooney   Diagnosis: Prostate cancer, hepatocellular carcinoma  INTERVAL HISTORY:   Edgar Mooney returns as scheduled.  He was discharged from [facility] to home last week.  Chest pain has resolved.  Sternum is now "sore".  He has left low back pain.  Oxycodone is effective.  No nausea or vomiting.  No mouth sores.  No diarrhea.  Objective:  Vital signs in last 24 hours:  Blood pressure (!) 142/75, pulse 89, temperature 98.2 F (36.8 C), temperature source Tympanic, resp. rate 18, weight 151 lb (68.5 kg), SpO2 100 %.    HEENT: No thrush or ulcers. Resp: Lungs clear bilaterally. Cardio: Regular rate and rhythm. GI: No hepatosplenomegaly. Vascular: No leg edema. Skin: No rash.   Lab Results:  Lab Results  Component Value Date   WBC 9.6 09/16/2021   HGB 11.2 (L) 09/16/2021   HCT 33.3 (L) 09/16/2021   MCV 93.0 09/16/2021   PLT 114 (L) 09/16/2021   NEUTROABS 6.9 08/22/2021    Imaging:  No results found.  Medications: I have reviewed the patient's current medications.  Assessment/Plan: Metastatic prostate cancer - Lytic bone lesions with retroperitoneal adenopathy concerning for metastatic prostate cancer -03/03/2020-CTA chest/abdomen/pelvis widespread osseous metastatic disease and lower retroperitoneal adenopathy, pathologic right anterior third rib fracture, probable spinal canal tumor at the level of S1, -MRI cervical, thoracic, and lumbar spine 03/03/2020-diffuse osseous metastatic disease, ventral epidural tumor impinging on right S1 nerve root, extraosseous tumor at the bilateral ilium, asymmetric enhancing material at the right C5-6 canal-right  -03/03/2020-PSA 763 -Degarelix 03/04/2020 -Abiraterone/prednisone 03/14/2020 -Every 31-monthLupron 04/01/2020 -04/01/2020 PSA 36.5 -05/31/2020 PSA 1.2 -06/28/2020 PSA 1.0 -12/14/2020 PSA 6.4 -01/13/2021 PSA 10.6 -04/26/2021 PSA  45.2 -05/16/2021 PSA 70 -Abiraterone/prednisone  discontinued 05/16/2021 -05/16/2021 left hip x-ray-multifocal sclerotic metastasis throughout the pelvis, confluent involving the left acetabulum.  No acute or pathologic fracture.  Left hip osteoarthritis. -Cycle 1 docetaxel 05/31/2021 -05/31/2021 PSA 113 -Palliative radiation to the left acetabulum 06/08/2021 - 06/20/2021 -Cycle 2 docetaxel 06/21/2021 -06/21/2021 PSA improved at 68 -Cycle 3 docetaxel 07/12/2021 -07/12/2021 PSA improved at 52 -Cycle 4 docetaxel 08/02/2021 -Cycle 5 docetaxel 08/22/2021   2.  Severe anemia-likely secondary to metastatic prostate cancer involving the bones 3.  Mild thrombocytopenia 4.  Cirrhosis with splenomegaly 5.  History of hepatitis C 6.  History of polysubstance abuse 7.  Depression 8.  Tobacco dependence 9.  Right arm weakness, right facial numbness-potentially related to nerve root compromise from metastatic bone lesions 10.  Pain secondary to #1 11.  Extensive bone metastases-every 332-monthometa starting 04/01/2020 12.  Gout-acute flare right wrist 04/01/2020 treated with indomethacin, allopurinol resumed 13.  Right hepatic lobe mass with elevated AFP [facility] 12/[facility]-hypoenhancing inferior right liver mass, segment 6, occluding or compressing the adjacent right portal vein branch, intrahepatic cholangiocarcinoma favored with differential including hepatocellular carcinoma, Li-Rads M Y 90 planning study 11-22 Y90 right lobe of liver 06/26/2021 14.  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  [facility]        Disposition: Edgar Mooney stable.  He does not wish to be enrolled in the hospice program.  He would like to continue active treatment  for the prostate cancer.  He has completed  5 cycles of Taxotere.  Most recent PSA was stable.  He will return for cycle 6 Taxotere later this week.  We will see him in follow-up in approximately 2 weeks.  Patient seen with Dr. Benay Mooney.    Ned Card ANP/GNP-BC   09/25/2021  10:05 AM This was a shared visit with Ned Card.  Edgar Mooney appears well today.  He does not wish to enter hospice care.  The plan is to complete cycle 6 docetaxel this week.  He has tolerated the docetaxel well.  He will return for an office visit in 2 weeks. We discussed the prognosis and treatment options. Edgar Mooney indicated he was not aware he was being transferred to [facility] and did not desire an admission for terminal care measures.   I was present for greater than 50% today's visit.  I performed medical decision making.  Edgar Manson, MD

## 2021-09-28 ENCOUNTER — Inpatient Hospital Stay: Payer: BC Managed Care – PPO

## 2021-09-28 ENCOUNTER — Encounter: Payer: Self-pay | Admitting: Oncology

## 2021-09-28 ENCOUNTER — Other Ambulatory Visit: Payer: Self-pay | Admitting: Nurse Practitioner

## 2021-09-28 ENCOUNTER — Telehealth: Payer: Medicare Other

## 2021-09-28 ENCOUNTER — Other Ambulatory Visit (HOSPITAL_BASED_OUTPATIENT_CLINIC_OR_DEPARTMENT_OTHER): Payer: Self-pay

## 2021-09-28 ENCOUNTER — Other Ambulatory Visit: Payer: Self-pay

## 2021-09-28 VITALS — BP 130/75 | HR 76 | Temp 98.4°F | Resp 20 | Wt 153.0 lb

## 2021-09-28 DIAGNOSIS — F17200 Nicotine dependence, unspecified, uncomplicated: Secondary | ICD-10-CM | POA: Diagnosis not present

## 2021-09-28 DIAGNOSIS — D649 Anemia, unspecified: Secondary | ICD-10-CM | POA: Diagnosis not present

## 2021-09-28 DIAGNOSIS — C61 Malignant neoplasm of prostate: Secondary | ICD-10-CM | POA: Diagnosis not present

## 2021-09-28 DIAGNOSIS — I252 Old myocardial infarction: Secondary | ICD-10-CM | POA: Diagnosis not present

## 2021-09-28 DIAGNOSIS — K7460 Unspecified cirrhosis of liver: Secondary | ICD-10-CM | POA: Diagnosis not present

## 2021-09-28 DIAGNOSIS — F32A Depression, unspecified: Secondary | ICD-10-CM | POA: Diagnosis not present

## 2021-09-28 DIAGNOSIS — F1911 Other psychoactive substance abuse, in remission: Secondary | ICD-10-CM | POA: Diagnosis not present

## 2021-09-28 DIAGNOSIS — M109 Gout, unspecified: Secondary | ICD-10-CM | POA: Diagnosis not present

## 2021-09-28 DIAGNOSIS — Z5111 Encounter for antineoplastic chemotherapy: Secondary | ICD-10-CM | POA: Diagnosis not present

## 2021-09-28 DIAGNOSIS — C220 Liver cell carcinoma: Secondary | ICD-10-CM | POA: Diagnosis not present

## 2021-09-28 DIAGNOSIS — R162 Hepatomegaly with splenomegaly, not elsewhere classified: Secondary | ICD-10-CM | POA: Diagnosis not present

## 2021-09-28 DIAGNOSIS — C7951 Secondary malignant neoplasm of bone: Secondary | ICD-10-CM | POA: Diagnosis not present

## 2021-09-28 DIAGNOSIS — G8929 Other chronic pain: Secondary | ICD-10-CM

## 2021-09-28 DIAGNOSIS — R200 Anesthesia of skin: Secondary | ICD-10-CM | POA: Diagnosis not present

## 2021-09-28 DIAGNOSIS — D696 Thrombocytopenia, unspecified: Secondary | ICD-10-CM | POA: Diagnosis not present

## 2021-09-28 DIAGNOSIS — Z8619 Personal history of other infectious and parasitic diseases: Secondary | ICD-10-CM | POA: Diagnosis not present

## 2021-09-28 LAB — CBC WITH DIFFERENTIAL (CANCER CENTER ONLY)
Abs Immature Granulocytes: 0.07 10*3/uL (ref 0.00–0.07)
Basophils Absolute: 0 10*3/uL (ref 0.0–0.1)
Basophils Relative: 0 %
Eosinophils Absolute: 0.1 10*3/uL (ref 0.0–0.5)
Eosinophils Relative: 1 %
HCT: 35.8 % — ABNORMAL LOW (ref 39.0–52.0)
Hemoglobin: 11.3 g/dL — ABNORMAL LOW (ref 13.0–17.0)
Immature Granulocytes: 1 %
Lymphocytes Relative: 6 %
Lymphs Abs: 0.4 10*3/uL — ABNORMAL LOW (ref 0.7–4.0)
MCH: 29.5 pg (ref 26.0–34.0)
MCHC: 31.6 g/dL (ref 30.0–36.0)
MCV: 93.5 fL (ref 80.0–100.0)
Monocytes Absolute: 0.5 10*3/uL (ref 0.1–1.0)
Monocytes Relative: 6 %
Neutro Abs: 6.7 10*3/uL (ref 1.7–7.7)
Neutrophils Relative %: 86 %
Platelet Count: 243 10*3/uL (ref 150–400)
RBC: 3.83 MIL/uL — ABNORMAL LOW (ref 4.22–5.81)
RDW: 14.9 % (ref 11.5–15.5)
WBC Count: 7.7 10*3/uL (ref 4.0–10.5)
nRBC: 0 % (ref 0.0–0.2)

## 2021-09-28 LAB — CMP (CANCER CENTER ONLY)
ALT: 13 U/L (ref 0–44)
AST: 17 U/L (ref 15–41)
Albumin: 3.5 g/dL (ref 3.5–5.0)
Alkaline Phosphatase: 176 U/L — ABNORMAL HIGH (ref 38–126)
Anion gap: 11 (ref 5–15)
BUN: 15 mg/dL (ref 6–20)
CO2: 20 mmol/L — ABNORMAL LOW (ref 22–32)
Calcium: 9.9 mg/dL (ref 8.9–10.3)
Chloride: 108 mmol/L (ref 98–111)
Creatinine: 0.55 mg/dL — ABNORMAL LOW (ref 0.61–1.24)
GFR, Estimated: >60 mL/min (ref >60)
Glucose, Bld: 102 mg/dL — ABNORMAL HIGH (ref 70–99)
Potassium: 4.2 mmol/L (ref 3.5–5.1)
Sodium: 139 mmol/L (ref 135–145)
Total Bilirubin: 0.4 mg/dL (ref 0.3–1.2)
Total Protein: 7.2 g/dL (ref 6.5–8.1)

## 2021-09-28 MED ORDER — OXYCODONE HCL 5 MG PO TABS
5.0000 mg | ORAL_TABLET | Freq: Once | ORAL | Status: AC
  Administered 2021-09-28: 5 mg via ORAL
  Filled 2021-09-28: qty 1

## 2021-09-28 MED ORDER — SODIUM CHLORIDE 0.9 % IV SOLN: Freq: Once | INTRAVENOUS | Status: AC

## 2021-09-28 MED ORDER — SODIUM CHLORIDE 0.9 % IV SOLN
10.0000 mg | Freq: Once | INTRAVENOUS | Status: AC
  Administered 2021-09-28: 10 mg via INTRAVENOUS
  Filled 2021-09-28: qty 1

## 2021-09-28 MED ORDER — SODIUM CHLORIDE 0.9 % IV SOLN
60.0000 mg/m2 | Freq: Once | INTRAVENOUS | Status: AC
  Administered 2021-09-28: 100 mg via INTRAVENOUS
  Filled 2021-09-28: qty 10

## 2021-09-28 MED ORDER — OXYCODONE HCL 5 MG PO TABS
5.0000 mg | ORAL_TABLET | Freq: Four times a day (QID) | ORAL | 0 refills | Status: DC | PRN
  Filled 2021-09-28 (×2): qty 60, 15d supply, fill #0

## 2021-09-28 NOTE — Patient Instructions (Signed)
Edgar Mooney   Discharge Instructions: Thank you for choosing [facility] to provide your oncology and hematology care.   If you have a lab appointment with the [facility], please go directly to the [facility] and check in at the registration area.   Wear comfortable clothing and clothing appropriate for easy access to any Portacath or PICC line.   We strive to give you quality time with your provider. You may need to reschedule your appointment if you arrive late (15 or more minutes).  Arriving late affects you and other patients whose appointments are after yours.  Also, if you miss three or more appointments without notifying the office, you may be dismissed from the clinic at the provider's discretion.      For prescription refill requests, have your pharmacy contact our office and allow 72 hours for refills to be completed.    Today you received the following chemotherapy and/or immunotherapy agents Docetaxel (TAXOTERE).      To help prevent nausea and vomiting after your treatment, we encourage you to take your nausea medication as directed.  BELOW ARE SYMPTOMS THAT SHOULD BE REPORTED IMMEDIATELY: *FEVER GREATER THAN 100.4 F (38 C) OR HIGHER *CHILLS OR SWEATING *NAUSEA AND VOMITING THAT IS NOT CONTROLLED WITH YOUR NAUSEA MEDICATION *UNUSUAL SHORTNESS OF BREATH *UNUSUAL BRUISING OR BLEEDING *URINARY PROBLEMS (pain or burning when urinating, or frequent urination) *BOWEL PROBLEMS (unusual diarrhea, constipation, pain near the anus) TENDERNESS IN MOUTH AND THROAT WITH OR WITHOUT PRESENCE OF ULCERS (sore throat, sores in mouth, or a toothache) UNUSUAL RASH, SWELLING OR PAIN  UNUSUAL VAGINAL DISCHARGE OR ITCHING   Items with * indicate a potential emergency and should be followed up as soon as possible or go to the Emergency Department if any problems should occur.  Please show the CHEMOTHERAPY ALERT CARD or IMMUNOTHERAPY ALERT CARD at  check-in to the Emergency Department and triage nurse.  Should you have questions after your visit or need to cancel or reschedule your appointment, please contact [facility]  Dept: [phone]  and follow the prompts.  Office hours are 8:00 a.m. to 4:30 p.m. Monday - Friday. Please note that voicemails left after 4:00 p.m. may not be returned until the following business day.  We are closed weekends and major holidays. You have access to a nurse at all times for urgent questions. Please call the main number to the clinic Dept: [phone] and follow the prompts.   For any non-urgent questions, you may also contact your provider using MyChart. We now offer e-Visits for anyone 16 and older to request care online for non-urgent symptoms. For details visit [email or web address].   Also download the MyChart app! Go to the app store, search "MyChart", open the app, select [facility], and log in with your MyChart username and password.  Masks are optional in the cancer centers. If you would like for your care team to wear a mask while they are taking care of you, please let them know. For doctor visits, patients may have with them one support person who is at least 56 years old. At this time, visitors are not allowed in the infusion area.  Docetaxel injection What is this medication? DOCETAXEL (doe se TAX el) is a chemotherapy drug. It targets fast dividing cells, like cancer cells, and causes these cells to die. This medicine is used to treat many types of cancers like breast cancer, certain stomach cancers, head and neck  cancer, lung cancer, and prostate cancer. This medicine may be used for other purposes; ask your health care provider or pharmacist if you have questions. COMMON BRAND NAME(S): Docefrez, Taxotere What should I tell my care team before I take this medication? They need to know if you have any of these conditions: infection (especially a virus infection  such as chickenpox, cold sores, or herpes) liver disease low blood counts, like low white cell, platelet, or red cell counts an unusual or allergic reaction to docetaxel, polysorbate 80, other chemotherapy agents, other medicines, foods, dyes, or preservatives pregnant or trying to get pregnant breast-feeding How should I use this medication? This drug is given as an infusion into a vein. It is administered in a hospital or clinic by a specially trained health care professional. Talk to your pediatrician regarding the use of this medicine in children. Special care may be needed. Overdosage: If you think you have taken too much of this medicine contact a poison control center or emergency room at once. NOTE: This medicine is only for you. Do not share this medicine with others. What if I miss a dose? It is important not to miss your dose. Call your doctor or health care professional if you are unable to keep an appointment. What may interact with this medication? Do not take this medicine with any of the following medications: live virus vaccines This medicine may also interact with the following medications: aprepitant certain antibiotics like erythromycin or clarithromycin certain antivirals for HIV or hepatitis certain medicines for fungal infections like fluconazole, itraconazole, ketoconazole, posaconazole, or voriconazole cimetidine ciprofloxacin conivaptan cyclosporine dronedarone fluvoxamine grapefruit juice imatinib verapamil This list may not describe all possible interactions. Give your health care provider a list of all the medicines, herbs, non-prescription drugs, or dietary supplements you use. Also tell them if you smoke, drink alcohol, or use illegal drugs. Some items may interact with your medicine. What should I watch for while using this medication? Your condition will be monitored carefully while you are receiving this medicine. You will need important blood work  done while you are taking this medicine. Call your doctor or health care professional for advice if you get a fever, chills or sore throat, or other symptoms of a cold or flu. Do not treat yourself. This drug decreases your body's ability to fight infections. Try to avoid being around people who are sick. Some products may contain alcohol. Ask your health care professional if this medicine contains alcohol. Be sure to tell all health care professionals you are taking this medicine. Certain medicines, like metronidazole and disulfiram, can cause an unpleasant reaction when taken with alcohol. The reaction includes flushing, headache, nausea, vomiting, sweating, and increased thirst. The reaction can last from 30 minutes to several hours. You may get drowsy or dizzy. Do not drive, use machinery, or do anything that needs mental alertness until you know how this medicine affects you. Do not stand or sit up quickly, especially if you are an older patient. This reduces the risk of dizzy or fainting spells. Alcohol may interfere with the effect of this medicine. Talk to your health care professional about your risk of cancer. You may be more at risk for certain types of cancer if you take this medicine. Do not become pregnant while taking this medicine or for 6 months after stopping it. Women should inform their doctor if they wish to become pregnant or think they might be pregnant. There is a potential for serious side effects  to an unborn child. Talk to your health care professional or pharmacist for more information. Do not breast-feed an infant while taking this medicine or for 1 week after stopping it. Males who get this medicine must use a condom during sex with females who can get pregnant. If you get a woman pregnant, the baby could have birth defects. The baby could die before they are born. You will need to continue wearing a condom for 3 months after stopping the medicine. Tell your health care provider  right away if your partner becomes pregnant while you are taking this medicine. This may interfere with the ability to father a child. You should talk to your doctor or health care professional if you are concerned about your fertility. What side effects may I notice from receiving this medication? Side effects that you should report to your doctor or health care professional as soon as possible: allergic reactions like skin rash, itching or hives, swelling of the face, lips, or tongue blurred vision breathing problems changes in vision low blood counts - This drug may decrease the number of white blood cells, red blood cells and platelets. You may be at increased risk for infections and bleeding. nausea and vomiting pain, redness or irritation at site where injected pain, tingling, numbness in the hands or feet redness, blistering, peeling, or loosening of the skin, including inside the mouth signs of decreased platelets or bleeding - bruising, pinpoint red spots on the skin, black, tarry stools, nosebleeds signs of decreased red blood cells - unusually weak or tired, fainting spells, lightheadedness signs of infection - fever or chills, cough, sore throat, pain or difficulty passing urine swelling of the ankle, feet, hands Side effects that usually do not require medical attention (report to your doctor or health care professional if they continue or are bothersome): constipation diarrhea fingernail or toenail changes hair loss loss of appetite mouth sores muscle pain This list may not describe all possible side effects. Call your doctor for medical advice about side effects. You may report side effects to FDA at 1-800-FDA-1088. Where should I keep my medication? This drug is given in a hospital or clinic and will not be stored at home. NOTE: This sheet is a summary. It may not cover all possible information. If you have questions about this medicine, talk to your doctor, pharmacist, or  health care provider.  2023 Elsevier/Gold Standard (2021-03-03 00:00:00)

## 2021-09-29 ENCOUNTER — Inpatient Hospital Stay: Payer: BC Managed Care – PPO

## 2021-09-29 LAB — PROSTATE-SPECIFIC AG, SERUM (LABCORP): Prostate Specific Ag, Serum: 329 ng/mL — ABNORMAL HIGH (ref 0.0–4.0)

## 2021-10-11 ENCOUNTER — Inpatient Hospital Stay: Payer: BC Managed Care – PPO

## 2021-10-11 ENCOUNTER — Inpatient Hospital Stay: Payer: BC Managed Care – PPO | Admitting: Nurse Practitioner

## 2021-10-12 ENCOUNTER — Inpatient Hospital Stay: Payer: BC Managed Care – PPO | Admitting: Oncology

## 2021-10-12 ENCOUNTER — Inpatient Hospital Stay: Payer: BC Managed Care – PPO

## 2021-10-13 ENCOUNTER — Telehealth: Payer: Self-pay

## 2021-10-13 NOTE — Telephone Encounter (Signed)
Called and left voicemail regarding patient's missed appointments on 10/11/21.  Instructed patient to call office and request to speak to scheduling.  Scheduling message also sent.

## 2021-10-16 ENCOUNTER — Other Ambulatory Visit: Payer: Self-pay | Admitting: Nurse Practitioner

## 2021-10-16 ENCOUNTER — Other Ambulatory Visit (HOSPITAL_BASED_OUTPATIENT_CLINIC_OR_DEPARTMENT_OTHER): Payer: Self-pay

## 2021-10-16 ENCOUNTER — Encounter: Payer: Self-pay | Admitting: *Deleted

## 2021-10-16 DIAGNOSIS — C61 Malignant neoplasm of prostate: Secondary | ICD-10-CM

## 2021-10-16 NOTE — Progress Notes (Signed)
While in hospital, hospitalist ordered patient to be Hospice. After D/C was seen by Dr. Benay Spice and this decision was reversed. Was notified by pharmacy that Medicaid is not paying for his outpatient meds now since Medicaid still has him listed as in Hospice. After several calls, was able to speak w/ Crystal at [facility] [phone]) and was informed his case manager is Governor Rooks [phone]). Crystal will have her call this office to get update on his current status. Was told it could be 24-48 hours.

## 2021-10-18 ENCOUNTER — Encounter: Payer: Self-pay | Admitting: Oncology

## 2021-10-18 ENCOUNTER — Other Ambulatory Visit (HOSPITAL_BASED_OUTPATIENT_CLINIC_OR_DEPARTMENT_OTHER): Payer: Self-pay

## 2021-10-18 ENCOUNTER — Encounter: Payer: Self-pay | Admitting: *Deleted

## 2021-10-18 ENCOUNTER — Telehealth: Payer: Self-pay

## 2021-10-18 ENCOUNTER — Other Ambulatory Visit: Payer: Self-pay | Admitting: Nurse Practitioner

## 2021-10-18 DIAGNOSIS — C61 Malignant neoplasm of prostate: Secondary | ICD-10-CM

## 2021-10-18 MED ORDER — OXYCODONE HCL 5 MG PO TABS
5.0000 mg | ORAL_TABLET | Freq: Four times a day (QID) | ORAL | 0 refills | Status: DC | PRN
  Filled 2021-10-18: qty 60, 15d supply, fill #0

## 2021-10-18 NOTE — Telephone Encounter (Signed)
Patient  called in and stated he needs his medication refill (oxycodone). The request was placed on the NP's desk.

## 2021-10-18 NOTE — Progress Notes (Signed)
Left voice mail for case [facility] [phone]) with update that patient is NOT Hospice. Requested return call to provide update so patient can obtain his prescriptions at his Medicaid price.

## 2021-10-20 ENCOUNTER — Inpatient Hospital Stay: Payer: BC Managed Care – PPO | Attending: Nurse Practitioner

## 2021-10-20 ENCOUNTER — Inpatient Hospital Stay: Payer: BC Managed Care – PPO

## 2021-10-20 ENCOUNTER — Telehealth: Payer: Self-pay | Admitting: Pharmacist

## 2021-10-20 ENCOUNTER — Inpatient Hospital Stay (HOSPITAL_BASED_OUTPATIENT_CLINIC_OR_DEPARTMENT_OTHER): Payer: BC Managed Care – PPO | Admitting: Oncology

## 2021-10-20 ENCOUNTER — Telehealth: Payer: Self-pay | Admitting: Pharmacy Technician

## 2021-10-20 ENCOUNTER — Other Ambulatory Visit (HOSPITAL_COMMUNITY): Payer: Self-pay

## 2021-10-20 VITALS — BP 128/69 | HR 79 | Temp 98.1°F | Resp 18 | Ht 62.0 in | Wt 154.8 lb

## 2021-10-20 DIAGNOSIS — C7951 Secondary malignant neoplasm of bone: Secondary | ICD-10-CM | POA: Insufficient documentation

## 2021-10-20 DIAGNOSIS — C61 Malignant neoplasm of prostate: Secondary | ICD-10-CM | POA: Insufficient documentation

## 2021-10-20 DIAGNOSIS — F1911 Other psychoactive substance abuse, in remission: Secondary | ICD-10-CM | POA: Diagnosis not present

## 2021-10-20 DIAGNOSIS — Z5111 Encounter for antineoplastic chemotherapy: Secondary | ICD-10-CM | POA: Insufficient documentation

## 2021-10-20 DIAGNOSIS — M109 Gout, unspecified: Secondary | ICD-10-CM | POA: Insufficient documentation

## 2021-10-20 DIAGNOSIS — F32A Depression, unspecified: Secondary | ICD-10-CM | POA: Insufficient documentation

## 2021-10-20 DIAGNOSIS — R772 Abnormality of alphafetoprotein: Secondary | ICD-10-CM | POA: Insufficient documentation

## 2021-10-20 DIAGNOSIS — K7460 Unspecified cirrhosis of liver: Secondary | ICD-10-CM | POA: Diagnosis not present

## 2021-10-20 DIAGNOSIS — D649 Anemia, unspecified: Secondary | ICD-10-CM | POA: Insufficient documentation

## 2021-10-20 DIAGNOSIS — R200 Anesthesia of skin: Secondary | ICD-10-CM | POA: Insufficient documentation

## 2021-10-20 DIAGNOSIS — R162 Hepatomegaly with splenomegaly, not elsewhere classified: Secondary | ICD-10-CM | POA: Insufficient documentation

## 2021-10-20 DIAGNOSIS — I2510 Atherosclerotic heart disease of native coronary artery without angina pectoris: Secondary | ICD-10-CM | POA: Diagnosis not present

## 2021-10-20 DIAGNOSIS — I252 Old myocardial infarction: Secondary | ICD-10-CM | POA: Diagnosis not present

## 2021-10-20 DIAGNOSIS — D696 Thrombocytopenia, unspecified: Secondary | ICD-10-CM | POA: Insufficient documentation

## 2021-10-20 DIAGNOSIS — Z8619 Personal history of other infectious and parasitic diseases: Secondary | ICD-10-CM | POA: Diagnosis not present

## 2021-10-20 DIAGNOSIS — M1612 Unilateral primary osteoarthritis, left hip: Secondary | ICD-10-CM | POA: Insufficient documentation

## 2021-10-20 LAB — CBC WITH DIFFERENTIAL (CANCER CENTER ONLY)
Abs Immature Granulocytes: 0.07 10*3/uL (ref 0.00–0.07)
Basophils Absolute: 0 10*3/uL (ref 0.0–0.1)
Basophils Relative: 0 %
Eosinophils Absolute: 0 10*3/uL (ref 0.0–0.5)
Eosinophils Relative: 0 %
HCT: 29.8 % — ABNORMAL LOW (ref 39.0–52.0)
Hemoglobin: 9.6 g/dL — ABNORMAL LOW (ref 13.0–17.0)
Immature Granulocytes: 1 %
Lymphocytes Relative: 5 %
Lymphs Abs: 0.4 10*3/uL — ABNORMAL LOW (ref 0.7–4.0)
MCH: 29 pg (ref 26.0–34.0)
MCHC: 32.2 g/dL (ref 30.0–36.0)
MCV: 90 fL (ref 80.0–100.0)
Monocytes Absolute: 0.7 10*3/uL (ref 0.1–1.0)
Monocytes Relative: 10 %
Neutro Abs: 5.9 10*3/uL (ref 1.7–7.7)
Neutrophils Relative %: 84 %
Platelet Count: 104 10*3/uL — ABNORMAL LOW (ref 150–400)
RBC: 3.31 MIL/uL — ABNORMAL LOW (ref 4.22–5.81)
RDW: 16.2 % — ABNORMAL HIGH (ref 11.5–15.5)
WBC Count: 7.1 10*3/uL (ref 4.0–10.5)
nRBC: 0 % (ref 0.0–0.2)

## 2021-10-20 LAB — CMP (CANCER CENTER ONLY)
ALT: 8 U/L (ref 0–44)
AST: 21 U/L (ref 15–41)
Albumin: 3.9 g/dL (ref 3.5–5.0)
Alkaline Phosphatase: 192 U/L — ABNORMAL HIGH (ref 38–126)
Anion gap: 9 (ref 5–15)
BUN: 9 mg/dL (ref 6–20)
CO2: 22 mmol/L (ref 22–32)
Calcium: 9.1 mg/dL (ref 8.9–10.3)
Chloride: 105 mmol/L (ref 98–111)
Creatinine: 0.55 mg/dL — ABNORMAL LOW (ref 0.61–1.24)
GFR, Estimated: >60 mL/min (ref >60)
Glucose, Bld: 98 mg/dL (ref 70–99)
Potassium: 4.1 mmol/L (ref 3.5–5.1)
Sodium: 136 mmol/L (ref 135–145)
Total Bilirubin: 0.9 mg/dL (ref 0.3–1.2)
Total Protein: 6.8 g/dL (ref 6.5–8.1)

## 2021-10-20 MED ORDER — OLAPARIB 150 MG PO TABS
300.0000 mg | ORAL_TABLET | Freq: Two times a day (BID) | ORAL | 0 refills | Status: DC
  Filled 2021-10-20 (×2): qty 120, 30d supply, fill #0
  Filled 2021-10-23: qty 60, 15d supply, fill #0
  Filled 2021-11-13: qty 60, 15d supply, fill #1

## 2021-10-20 NOTE — Telephone Encounter (Signed)
Oral Oncology Pharmacist Encounter  Received new prescription for Lynparza (olaparib) for the treatment of metastatic castration resistant prostate cancer in conjunction with ADT, planned duration until disease progression or unacceptable drug toxicity. Planned start next week.  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   CMP from 10/20/21 assessed, no relevant lab abnormalities. Prescription dose and frequency assessed.   Current medication list in Epic reviewed, no DDIs with olaparib identified.  Evaluated chart and no patient barriers to medication adherence identified.   Prescription has been e-scribed to the [facility] for benefits analysis and approval.  Oral Oncology Clinic will continue to follow for insurance authorization, copayment issues, initial counseling and start date.  Patient agreed to treatment on 10/20/21 per MD documentation.  Darl Pikes, PharmD, BCPS, BCOP, CPP Hematology/Oncology Clinical Pharmacist Practitioner [facility]/DB/AP [facility] [phone]  10/20/2021 3:12 PM

## 2021-10-20 NOTE — Telephone Encounter (Signed)
Oral Oncology Patient Advocate Encounter  Prior Authorization for Edgar [facility]oney has been approved.    PA# [id number] Effective dates: 10/20/2021 through 10/19/2022  Patients co-pay is $4,745.88 through [name].  Patient has [name] as secondary which brings final co-pay to $4.   Lady Deutscher, CPhT-Adv Pharmacy Patient Advocate Specialist [facility] Patient Advocate Team Direct Number: [phone]  Fax: [phone]

## 2021-10-20 NOTE — Telephone Encounter (Signed)
Oral Oncology Patient Advocate Encounter   Received notification that prior authorization for Edgar Mooney is required.   PA submitted on 10/20/2021 Key [id number] Status is pending     Lady Deutscher, CPhT-Adv Pharmacy Patient Advocate Specialist [facility] Patient Advocate Team Direct Number: [phone]  Fax: [phone]

## 2021-10-20 NOTE — Progress Notes (Signed)
[facility] OFFICE PROGRESS NOTE   Diagnosis: Prostate cancer  INTERVAL HISTORY:   Mr. Kunz returns as scheduled pretty complete another cycle of docetaxel on 09/28/2021.  He did not receive G-CSF support.  He continues prednisone.  He reports increased "soreness "over the anterior chest and back.  He takes oxycodone as needed.  He took a trip to the beach last week.  Objective:  Vital signs in last 24 hours:  Blood pressure 128/69, pulse 79, temperature 98.1 F (36.7 C), temperature source Oral, resp. rate 18, height $RemoveBe'5\' 2"'[id number]$  (1.575 m), weight 154 lb 12.8 oz (70.2 kg), SpO2 100 %.    HEENT: No thrush or ulcers Resp: Lungs clear bilaterally Cardio: Regular rate and rhythm GI: No hepatosplenomegaly Vascular: No leg edema Musculoskeletal: Tender over the sternum  Lab Results:  Lab Results  Component Value Date   WBC 7.1 10/20/2021   HGB 9.6 (L) 10/20/2021   HCT 29.8 (L) 10/20/2021   MCV 90.0 10/20/2021   PLT 104 (L) 10/20/2021   NEUTROABS 5.9 10/20/2021    CMP  Lab Results  Component Value Date   NA 139 09/28/2021   K 4.2 09/28/2021   CL 108 09/28/2021   CO2 20 (L) 09/28/2021   GLUCOSE 102 (H) 09/28/2021   BUN 15 09/28/2021   CREATININE 0.55 (L) 09/28/2021   CALCIUM 9.9 09/28/2021   PROT 7.2 09/28/2021   ALBUMIN 3.5 09/28/2021   AST 17 09/28/2021   ALT 13 09/28/2021   ALKPHOS 176 (H) 09/28/2021   BILITOT 0.4 09/28/2021   GFRNONAA >60 09/28/2021   GFRAA 121 12/19/2017     Medications: I have reviewed the patient's current medications.   Assessment/Plan: Metastatic prostate cancer - Lytic bone lesions with retroperitoneal adenopathy concerning for metastatic prostate cancer -03/03/2020-CTA chest/abdomen/pelvis widespread osseous metastatic disease and lower retroperitoneal adenopathy, pathologic right anterior third rib fracture, probable spinal canal tumor at the level of S1, -MRI cervical, thoracic, and lumbar spine 03/03/2020-diffuse  osseous metastatic disease, ventral epidural tumor impinging on right S1 nerve root, extraosseous tumor at the bilateral ilium, asymmetric enhancing material at the right C5-6 canal-right  -03/03/2020-PSA 763 -Degarelix 03/04/2020 -Abiraterone/prednisone 03/14/2020 -Every 54-month Lupron 04/01/2020 -04/01/2020 PSA 36.5 -05/31/2020 PSA 1.2 -06/28/2020 PSA 1.0 -12/14/2020 PSA 6.4 -01/13/2021 PSA 10.6 -04/26/2021 PSA  45.2 -05/16/2021 PSA 70 -Abiraterone/prednisone discontinued 05/16/2021 -05/16/2021 left hip x-ray-multifocal sclerotic metastasis throughout the pelvis, confluent involving the left acetabulum.  No acute or pathologic fracture.  Left hip osteoarthritis. -Cycle 1 docetaxel 05/31/2021 -05/31/2021 PSA 113 -Palliative radiation to the left acetabulum 06/08/2021 - 06/20/2021 -Cycle 2 docetaxel 06/21/2021 -06/21/2021 PSA improved at 68 -Cycle 3 docetaxel 07/12/2021 -07/12/2021 PSA improved at 52 -Cycle 4 docetaxel 08/02/2021 -Cycle 5 docetaxel 08/22/2021 -CT chest 09/16/2021-negative for pulmonary embolism, right liver mass, extensive bone metastases -Cycle 6 docetaxel 09/28/2021 -PSA increased 09/28/2021   2.  Severe anemia-likely secondary to metastatic prostate cancer involving the bones 3.  Mild thrombocytopenia 4.  Cirrhosis with splenomegaly 5.  History of hepatitis C 6.  History of polysubstance abuse 7.  Depression 8.  Tobacco dependence 9.  Right arm weakness, right facial numbness-potentially related to nerve root compromise from metastatic bone lesions 10.  Pain secondary to #1 11.  Extensive bone metastases-every 14-month Zometa starting 04/01/2020 12.  Gout-acute flare right wrist 04/01/2020 treated with indomethacin, allopurinol resumed 13.  Right hepatic lobe mass with elevated AFP concerning for [facility] MRI abdomen 12/21/2020-hypoenhancing inferior right liver mass, segment 6, occluding or compressing the adjacent right portal  vein branch, intrahepatic cholangiocarcinoma favored with  differential including hepatocellular carcinoma, Li-Rads M Y 90 planning study 11-22 Y90 right lobe of liver 06/26/2021 14.  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  Presentation to the [facility] emergency room 09/16/2021 with chest pain-transfer to [facility] on a morphine drip and scheduled Ativan       Disposition: Mr. Macleod has metastatic prostate cancer.  He underwent radioembolization for treatment of a right liver hepatocellular carcinoma in March.  There is clinical and laboratory evidence of progressive prostate cancer while on treatment with docetaxel.  Docetaxel will be discontinued.  He will continue leuprolide and Zometa.  I discussed treatment options with Mr. Cuervo.  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  He appears to be a candidate for a trial of olaparib.  We reviewed potential toxicities associated with the lapper rib including the chance of hematologic toxicity, rash, pneumonitis, and thromboembolic events.  He will receive additional teaching from the Cancer center pharmacist.  The plan is to begin olaparib in 1 week.  He will return for an office and lab visit during the week of 11/06/2021.  He will continue oxycodone as needed for pain.  Betsy Coder, MD  10/20/2021  12:18 PM

## 2021-10-21 LAB — PROSTATE-SPECIFIC AG, SERUM (LABCORP): Prostate Specific Ag, Serum: 287 ng/mL — ABNORMAL HIGH (ref 0.0–4.0)

## 2021-10-23 ENCOUNTER — Other Ambulatory Visit (HOSPITAL_COMMUNITY): Payer: Self-pay

## 2021-10-23 ENCOUNTER — Telehealth: Payer: Self-pay | Admitting: Oncology

## 2021-10-23 ENCOUNTER — Other Ambulatory Visit: Payer: Self-pay | Admitting: *Deleted

## 2021-10-23 ENCOUNTER — Encounter: Payer: Self-pay | Admitting: Oncology

## 2021-10-23 MED ORDER — PROCHLORPERAZINE MALEATE 10 MG PO TABS
10.0000 mg | ORAL_TABLET | Freq: Four times a day (QID) | ORAL | 0 refills | Status: DC | PRN
  Filled 2021-10-23: qty 60, 15d supply, fill #0

## 2021-10-23 MED ORDER — ONDANSETRON HCL 8 MG PO TABS
8.0000 mg | ORAL_TABLET | Freq: Three times a day (TID) | ORAL | 1 refills | Status: DC | PRN
  Filled 2021-10-23: qty 30, 10d supply, fill #0

## 2021-10-23 NOTE — Telephone Encounter (Signed)
Oral Chemotherapy Pharmacist Encounter  [facility] will deliver medication to Edgar Mooney on 10/24/21. He will get started on the Lynparza when he has it in hand.  Patient Education I spoke with patient for overview of new oral chemotherapy medication: Lynparza (olaparib) for the treatment of metastatic castration resistant prostate cancer in conjunction with ADT, planned duration until disease progression or unacceptable drug toxicity.   Counseled patient on administration, dosing, side effects, monitoring, drug-food interactions, safe handling, storage, and disposal. Patient will take 2 tablets (300 mg total) by mouth 2 (two) times daily. May take with food to decrease nausea and vomiting.  Side effects include but not limited to: nausea, diarrhea, fatigue, decreased hgb. Nausea: Edgar Mooney will be delivering anti-emetics along with his oral chemo. He knows to take the anti-emetics as needed   Diarrhea: patient knows to take loperamide if needed and to call the office if he is having 4 for more loose stools.   Reviewed with patient importance of keeping a medication schedule and plan for any missed doses.  After discussion with patient no patient barriers to medication adherence identified.   Edgar Mooney voiced understanding and appreciation. All questions answered. Medication handout provided.  Provided patient with [facility] phone number. Patient knows to call the office with questions or concerns. Oral [facility]otherapy [facility] will continue to follow.  Darl Pikes, PharmD, BCPS, BCOP, CPP Hematology/Oncology Clinical Pharmacist Practitioner Friend/DB/AP [facility] [phone]  10/23/2021 11:51 AM

## 2021-10-23 NOTE — Telephone Encounter (Signed)
Patient was given two gas cards per Otilio Carpen she verified for patient to receive two cards. Cards were given on 7/7.

## 2021-10-23 NOTE — Progress Notes (Signed)
Compazine and Zofran scripts to [facility] at request of oral oncology pharmacist.

## 2021-10-25 ENCOUNTER — Other Ambulatory Visit: Payer: Self-pay | Admitting: Nurse Practitioner

## 2021-10-25 ENCOUNTER — Telehealth: Payer: Self-pay

## 2021-10-25 ENCOUNTER — Other Ambulatory Visit (HOSPITAL_BASED_OUTPATIENT_CLINIC_OR_DEPARTMENT_OTHER): Payer: Self-pay

## 2021-10-25 DIAGNOSIS — C61 Malignant neoplasm of prostate: Secondary | ICD-10-CM

## 2021-10-25 MED ORDER — OXYCODONE HCL 5 MG PO TABS
5.0000 mg | ORAL_TABLET | Freq: Four times a day (QID) | ORAL | 0 refills | Status: DC | PRN
  Filled 2021-10-25: qty 75, 10d supply, fill #0

## 2021-10-25 NOTE — Telephone Encounter (Signed)
Patient called in and states he is in severe pain. He stated he have been in pain in the last three days. Pain is located in the back.hips, and legs. Pain is a 10 out of 10. No chest pain, or no fever. He start taking his Lynparza this morning.Per Dr. Benay Spice he can take  1-2 tablets of Oxycodone every 4 hours, if his pain does not easy up he can start taking tylenol. The provider will be send him a new prescription to [facility]. Patient gave verbal understanding and had no further question or concerns.

## 2021-10-26 ENCOUNTER — Encounter: Payer: Self-pay | Admitting: Oncology

## 2021-10-26 ENCOUNTER — Other Ambulatory Visit (HOSPITAL_COMMUNITY): Payer: Self-pay

## 2021-10-26 ENCOUNTER — Other Ambulatory Visit (HOSPITAL_BASED_OUTPATIENT_CLINIC_OR_DEPARTMENT_OTHER): Payer: Self-pay

## 2021-10-30 ENCOUNTER — Other Ambulatory Visit (HOSPITAL_COMMUNITY): Payer: Self-pay

## 2021-11-03 ENCOUNTER — Other Ambulatory Visit (HOSPITAL_COMMUNITY): Payer: Self-pay

## 2021-11-06 ENCOUNTER — Encounter: Payer: Self-pay | Admitting: *Deleted

## 2021-11-06 ENCOUNTER — Encounter: Payer: Self-pay | Admitting: Nurse Practitioner

## 2021-11-06 ENCOUNTER — Other Ambulatory Visit (HOSPITAL_COMMUNITY): Payer: Self-pay

## 2021-11-06 ENCOUNTER — Other Ambulatory Visit (HOSPITAL_BASED_OUTPATIENT_CLINIC_OR_DEPARTMENT_OTHER): Payer: Self-pay

## 2021-11-06 ENCOUNTER — Inpatient Hospital Stay (HOSPITAL_BASED_OUTPATIENT_CLINIC_OR_DEPARTMENT_OTHER): Payer: BC Managed Care – PPO | Admitting: Nurse Practitioner

## 2021-11-06 ENCOUNTER — Inpatient Hospital Stay: Payer: BC Managed Care – PPO

## 2021-11-06 VITALS — BP 118/68 | HR 81 | Temp 98.1°F | Resp 18 | Ht 62.0 in | Wt 147.6 lb

## 2021-11-06 VITALS — BP 114/65 | HR 81 | Resp 20

## 2021-11-06 DIAGNOSIS — C61 Malignant neoplasm of prostate: Secondary | ICD-10-CM | POA: Diagnosis not present

## 2021-11-06 DIAGNOSIS — K7460 Unspecified cirrhosis of liver: Secondary | ICD-10-CM | POA: Diagnosis not present

## 2021-11-06 DIAGNOSIS — M1612 Unilateral primary osteoarthritis, left hip: Secondary | ICD-10-CM | POA: Diagnosis not present

## 2021-11-06 DIAGNOSIS — I252 Old myocardial infarction: Secondary | ICD-10-CM | POA: Diagnosis not present

## 2021-11-06 DIAGNOSIS — M109 Gout, unspecified: Secondary | ICD-10-CM | POA: Diagnosis not present

## 2021-11-06 DIAGNOSIS — D649 Anemia, unspecified: Secondary | ICD-10-CM | POA: Diagnosis not present

## 2021-11-06 DIAGNOSIS — Z8619 Personal history of other infectious and parasitic diseases: Secondary | ICD-10-CM | POA: Diagnosis not present

## 2021-11-06 DIAGNOSIS — Z5111 Encounter for antineoplastic chemotherapy: Secondary | ICD-10-CM | POA: Diagnosis not present

## 2021-11-06 DIAGNOSIS — I2510 Atherosclerotic heart disease of native coronary artery without angina pectoris: Secondary | ICD-10-CM | POA: Diagnosis not present

## 2021-11-06 DIAGNOSIS — C7951 Secondary malignant neoplasm of bone: Secondary | ICD-10-CM | POA: Diagnosis not present

## 2021-11-06 DIAGNOSIS — R162 Hepatomegaly with splenomegaly, not elsewhere classified: Secondary | ICD-10-CM | POA: Diagnosis not present

## 2021-11-06 DIAGNOSIS — R200 Anesthesia of skin: Secondary | ICD-10-CM | POA: Diagnosis not present

## 2021-11-06 DIAGNOSIS — F1911 Other psychoactive substance abuse, in remission: Secondary | ICD-10-CM | POA: Diagnosis not present

## 2021-11-06 DIAGNOSIS — D696 Thrombocytopenia, unspecified: Secondary | ICD-10-CM | POA: Diagnosis not present

## 2021-11-06 DIAGNOSIS — R772 Abnormality of alphafetoprotein: Secondary | ICD-10-CM | POA: Diagnosis not present

## 2021-11-06 DIAGNOSIS — F32A Depression, unspecified: Secondary | ICD-10-CM | POA: Diagnosis not present

## 2021-11-06 LAB — CBC WITH DIFFERENTIAL (CANCER CENTER ONLY)
Abs Immature Granulocytes: 0.04 10*3/uL (ref 0.00–0.07)
Basophils Absolute: 0 10*3/uL (ref 0.0–0.1)
Basophils Relative: 1 %
Eosinophils Absolute: 0.1 10*3/uL (ref 0.0–0.5)
Eosinophils Relative: 1 %
HCT: 27.4 % — ABNORMAL LOW (ref 39.0–52.0)
Hemoglobin: 8.9 g/dL — ABNORMAL LOW (ref 13.0–17.0)
Immature Granulocytes: 1 %
Lymphocytes Relative: 9 %
Lymphs Abs: 0.4 10*3/uL — ABNORMAL LOW (ref 0.7–4.0)
MCH: 28.4 pg (ref 26.0–34.0)
MCHC: 32.5 g/dL (ref 30.0–36.0)
MCV: 87.5 fL (ref 80.0–100.0)
Monocytes Absolute: 0.3 10*3/uL (ref 0.1–1.0)
Monocytes Relative: 7 %
Neutro Abs: 3.5 10*3/uL (ref 1.7–7.7)
Neutrophils Relative %: 81 %
Platelet Count: 153 10*3/uL (ref 150–400)
RBC: 3.13 MIL/uL — ABNORMAL LOW (ref 4.22–5.81)
RDW: 16.5 % — ABNORMAL HIGH (ref 11.5–15.5)
WBC Count: 4.2 10*3/uL (ref 4.0–10.5)
nRBC: 0 % (ref 0.0–0.2)

## 2021-11-06 LAB — CMP (CANCER CENTER ONLY)
ALT: 6 U/L (ref 0–44)
AST: 14 U/L — ABNORMAL LOW (ref 15–41)
Albumin: 3.7 g/dL (ref 3.5–5.0)
Alkaline Phosphatase: 219 U/L — ABNORMAL HIGH (ref 38–126)
Anion gap: 11 (ref 5–15)
BUN: 12 mg/dL (ref 6–20)
CO2: 21 mmol/L — ABNORMAL LOW (ref 22–32)
Calcium: 9.3 mg/dL (ref 8.9–10.3)
Chloride: 103 mmol/L (ref 98–111)
Creatinine: 1.23 mg/dL (ref 0.61–1.24)
GFR, Estimated: >60 mL/min (ref >60)
Glucose, Bld: 108 mg/dL — ABNORMAL HIGH (ref 70–99)
Potassium: 4.3 mmol/L (ref 3.5–5.1)
Sodium: 135 mmol/L (ref 135–145)
Total Bilirubin: 0.6 mg/dL (ref 0.3–1.2)
Total Protein: 6.7 g/dL (ref 6.5–8.1)

## 2021-11-06 MED ORDER — LEUPROLIDE ACETATE (3 MONTH) 22.5 MG ~~LOC~~ KIT
22.5000 mg | PACK | Freq: Once | SUBCUTANEOUS | Status: AC
  Administered 2021-11-06: 22.5 mg via SUBCUTANEOUS
  Filled 2021-11-06: qty 22.5

## 2021-11-06 MED ORDER — ZOLEDRONIC ACID 4 MG/100ML IV SOLN
4.0000 mg | Freq: Once | INTRAVENOUS | Status: AC
  Administered 2021-11-06: 4 mg via INTRAVENOUS
  Filled 2021-11-06: qty 100

## 2021-11-06 MED ORDER — OXYCODONE HCL 5 MG PO TABS
5.0000 mg | ORAL_TABLET | Freq: Four times a day (QID) | ORAL | 0 refills | Status: DC | PRN
  Filled 2021-11-06: qty 61, 8d supply, fill #0
  Filled 2021-11-06: qty 14, 2d supply, fill #0

## 2021-11-06 MED ORDER — SODIUM CHLORIDE 0.9 % IV SOLN: Freq: Once | INTRAVENOUS | Status: AC

## 2021-11-06 NOTE — Progress Notes (Signed)
Patient seen by Ned Card NP today  Vitals are within treatment parameters.  Labs reviewed by Ned Card NP and are within treatment parameters.  Per physician team, patient is ready for treatment. Please note that modifications are being made to the treatment plan including Due to receive Lupron and Zometa today only.

## 2021-11-06 NOTE — Progress Notes (Signed)
[facility] OFFICE PROGRESS NOTE   Diagnosis: Prostate cancer  INTERVAL HISTORY:   Mr. Tripodi returns as scheduled.  He began olaparib on 10/24/2021.  He subsequently began experiencing "muscle spasms" in his neck.  He decreased the dose of olaparib to 1 tablet twice a day.  The neck discomfort has resolved.  He has had a single episode of nausea/vomiting.  A few sores on his lips that are healing.  Bowels are moving.  He denies constipation.  No rash.  Appetite is poor.  He is interested in beginning nutritional supplements.  He complains of pain in his knees and lower back.  He is taking oxycodone every 4-6 hours around-the-clock.  Objective:  Vital signs in last 24 hours:  Blood pressure 118/68, pulse 81, temperature 98.1 F (36.7 C), temperature source Oral, resp. rate 18, height $RemoveBe'5\' 2"'KZHokTPBU$  (1.575 m), weight 147 lb 9.6 oz (67 kg), SpO2 100 %.    HEENT: No thrush or ulcers. Resp: Lungs clear bilaterally. Cardio: Regular rate and rhythm. GI: No hepatosplenomegaly. Vascular: No leg edema. Skin: No rash.   Lab Results:  Lab Results  Component Value Date   WBC 4.2 11/06/2021   HGB 8.9 (L) 11/06/2021   HCT 27.4 (L) 11/06/2021   MCV 87.5 11/06/2021   PLT 153 11/06/2021   NEUTROABS 3.5 11/06/2021    Imaging:  No results found.  Medications: I have reviewed the patient's current medications.  Assessment/Plan: Metastatic prostate cancer - Lytic bone lesions with retroperitoneal adenopathy concerning for metastatic prostate cancer -03/03/2020-CTA chest/abdomen/pelvis widespread osseous metastatic disease and lower retroperitoneal adenopathy, pathologic right anterior third rib fracture, probable spinal canal tumor at the level of S1, -MRI cervical, thoracic, and lumbar spine 03/03/2020-diffuse osseous metastatic disease, ventral epidural tumor impinging on right S1 nerve root, extraosseous tumor at the bilateral ilium, asymmetric enhancing material at the right  C5-6 canal-right  -03/03/2020-PSA 763 -Degarelix 03/04/2020 -Abiraterone/prednisone 03/14/2020 -Every 73-month Lupron 04/01/2020 -04/01/2020 PSA 36.5 -05/31/2020 PSA 1.2 -06/28/2020 PSA 1.0 -12/14/2020 PSA 6.4 -01/13/2021 PSA 10.6 -04/26/2021 PSA  45.2 -05/16/2021 PSA 70 -Abiraterone/prednisone discontinued 05/16/2021 -05/16/2021 left hip x-ray-multifocal sclerotic metastasis throughout the pelvis, confluent involving the left acetabulum.  No acute or pathologic fracture.  Left hip osteoarthritis. -Cycle 1 docetaxel 05/31/2021 -05/31/2021 PSA 113 -Palliative radiation to the left acetabulum 06/08/2021 - 06/20/2021 -Cycle 2 docetaxel 06/21/2021 -06/21/2021 PSA improved at 68 -Cycle 3 docetaxel 07/12/2021 -07/12/2021 PSA improved at 52 -Cycle 4 docetaxel 08/02/2021 -Cycle 5 docetaxel 08/22/2021 -CT chest 09/16/2021-negative for pulmonary embolism, right liver mass, extensive bone metastases -Cycle 6 docetaxel 09/28/2021 -PSA increased 09/28/2021 -Olaparib 10/24/2021, patient decreased dose to 1 tablet twice daily on 10/31/2021 due to muscle spasms; dose resumed at prescribed amount 11/06/2021   2.  Severe anemia-likely secondary to metastatic prostate cancer involving the bones 3.  Mild thrombocytopenia 4.  Cirrhosis with splenomegaly 5.  History of hepatitis C 6.  History of polysubstance abuse 7.  Depression 8.  Tobacco dependence 9.  Right arm weakness, right facial numbness-potentially related to nerve root compromise from metastatic bone lesions 10.  Pain secondary to #1 11.  Extensive bone metastases-every 102-month Zometa starting 04/01/2020 12.  Gout-acute flare right wrist 04/01/2020 treated with indomethacin, allopurinol resumed 13.  Right hepatic lobe mass with elevated AFP concerning for [facility] MRI abdomen 12/21/2020-hypoenhancing inferior right liver mass, segment 6, occluding or compressing the adjacent right portal vein branch, intrahepatic cholangiocarcinoma favored with differential including  hepatocellular carcinoma, Li-Rads M Y 90 planning study 11-22 Y90 right  lobe of liver 06/26/2021 14.  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  Presentation to the [facility] emergency room 09/16/2021 with chest pain-transfer to [facility] on a morphine drip and scheduled Ativan      Disposition: Edgar Mooney appears unchanged.  He began olaparib about 2 weeks ago, reduced the dose 1 week ago due to muscle spasms.  I encouraged him to resume the recommended dose and contact the office if the muscle spasms recur.  He agrees with this plan.  He will receive Lupron and Zometa today.  CBC and chemistry panel reviewed.  Labs adequate to proceed as above.  For pain he continues oxycodone as needed.  New prescription sent to his pharmacy today.  He has lost weight since his last visit.  He is going to begin nutritional supplements.  He will return for lab and follow-up in 2 weeks.  He will contact the office in the interim as outlined above or with any other problems.    Ned Card ANP/GNP-BC   11/06/2021  10:56 AM

## 2021-11-06 NOTE — Progress Notes (Signed)
Provided Edgar Mooney a case of vanilla Ensure as well as coupons.

## 2021-11-06 NOTE — Patient Instructions (Signed)
Zoledronic Acid Injection (Cancer) What is this medication? ZOLEDRONIC ACID (ZOE le dron ik AS id) treats high calcium levels in the blood caused by cancer. It may also be used with chemotherapy to treat weakened bones caused by cancer. It works by slowing down the release of calcium from bones. This lowers calcium levels in your blood. It also makes your bones stronger and less likely to break (fracture). It belongs to a group of medications called bisphosphonates. This medicine may be used for other purposes; ask your health care provider or pharmacist if you have questions. COMMON BRAND NAME(S): Zometa, Zometa Powder What should I tell my care team before I take this medication? They need to know if you have any of these conditions: Dehydration Dental disease Kidney disease Liver disease Low levels of calcium in the blood Lung or breathing disease, such as asthma Receiving steroids, such as dexamethasone or prednisone An unusual or allergic reaction to zoledronic acid, other medications, foods, dyes, or preservatives Pregnant or trying to get pregnant Breast-feeding How should I use this medication? This medication is injected into a vein. It is given by your care team in a hospital or clinic setting. Talk to your care team about the use of this medication in children. Special care may be needed. Overdosage: If you think you have taken too much of this medicine contact a poison control center or emergency room at once. NOTE: This medicine is only for you. Do not share this medicine with others. What if I miss a dose? Keep appointments for follow-up doses. It is important not to miss your dose. Call your care team if you are unable to keep an appointment. What may interact with this medication? Certain antibiotics given by injection Diuretics, such as bumetanide, furosemide NSAIDs, medications for pain and inflammation, such as ibuprofen or naproxen Teriparatide Thalidomide This list  may not describe all possible interactions. Give your health care provider a list of all the medicines, herbs, non-prescription drugs, or dietary supplements you use. Also tell them if you smoke, drink alcohol, or use illegal drugs. Some items may interact with your medicine. What should I watch for while using this medication? Visit your care team for regular checks on your progress. It may be some time before you see the benefit from this medication. Some people who take this medication have severe bone, joint, or muscle pain. This medication may also increase your risk for jaw problems or a broken thigh bone. Tell your care team right away if you have severe pain in your jaw, bones, joints, or muscles. Tell you care team if you have any pain that does not go away or that gets worse. Tell your dentist and dental surgeon that you are taking this medication. You should not have major dental surgery while on this medication. See your dentist to have a dental exam and fix any dental problems before starting this medication. Take good care of your teeth while on this medication. Make sure you see your dentist for regular follow-up appointments. You should make sure you get enough calcium and vitamin D while you are taking this medication. Discuss the foods you eat and the vitamins you take with your care team. Check with your care team if you have severe diarrhea, nausea, and vomiting, or if you sweat a lot. The loss of too much body fluid may make it dangerous for you to take this medication. You may need bloodwork while taking this medication. Talk to your care team if   you wish to become pregnant or think you might be pregnant. This medication can cause serious birth defects. What side effects may I notice from receiving this medication? Side effects that you should report to your care team as soon as possible: Allergic reactions--skin rash, itching, hives, swelling of the face, lips, tongue, or  throat Kidney injury--decrease in the amount of urine, swelling of the ankles, hands, or feet Low calcium level--muscle pain or cramps, confusion, tingling, or numbness in the hands or feet Osteonecrosis of the jaw--pain, swelling, or redness in the mouth, numbness of the jaw, poor healing after dental work, unusual discharge from the mouth, visible bones in the mouth Severe bone, joint, or muscle pain Side effects that usually do not require medical attention (report to your care team if they continue or are bothersome): Constipation Fatigue Fever Loss of appetite Nausea Stomach pain This list may not describe all possible side effects. Call your doctor for medical advice about side effects. You may report side effects to FDA at 1-800-FDA-1088. Where should I keep my medication? This medication is given in a hospital or clinic. It will not be stored at home. NOTE: This sheet is a summary. It may not cover all possible information. If you have questions about this medicine, talk to your doctor, pharmacist, or health care provider.  2023 Elsevier/Gold Standard (2021-05-26 00:00:00)  Leuprolide injection What is this medication? LEUPROLIDE (loo PROE lide) is a man-made hormone. It is used to treat the symptoms of prostate cancer. This medicine may also be used to treat children with early onset of puberty. It may be used for other hormonal conditions. This medicine may be used for other purposes; ask your health care provider or pharmacist if you have questions. COMMON BRAND NAME(S): Lupron What should I tell my care team before I take this medication? They need to know if you have any of these conditions: diabetes heart disease or previous heart attack high blood pressure high cholesterol pain or difficulty passing urine spinal cord metastasis stroke tobacco smoker an unusual or allergic reaction to leuprolide, benzyl alcohol, other medicines, foods, dyes, or preservatives pregnant  or trying to get pregnant breast-feeding How should I use this medication? This medicine is for injection under the skin or into a muscle. You will be taught how to prepare and give this medicine. Use exactly as directed. Take your medicine at regular intervals. Do not take your medicine more often than directed. It is important that you put your used needles and syringes in a special sharps container. Do not put them in a trash can. If you do not have a sharps container, call your pharmacist or healthcare provider to get one. A special MedGuide will be given to you by the pharmacist with each prescription and refill. Be sure to read this information carefully each time. Talk to your pediatrician regarding the use of this medicine in children. While this medicine may be prescribed for children as young as 8 years for selected conditions, precautions do apply. Overdosage: If you think you have taken too much of this medicine contact a poison control center or emergency room at once. NOTE: This medicine is only for you. Do not share this medicine with others. What if I miss a dose? If you miss a dose, take it as soon as you can. If it is almost time for your next dose, take only that dose. Do not take double or extra doses. What may interact with this medication? Do not take  this medicine with any of the following medications: chasteberry cisapride dronedarone pimozide thioridazine This medicine may also interact with the following medications: herbal or dietary supplements, like black cohosh or DHEA male hormones, like estrogens or progestins and birth control pills, patches, rings, or injections male hormones, like testosterone other medicines that prolong the QT interval (abnormal heart rhythm) This list may not describe all possible interactions. Give your health care provider a list of all the medicines, herbs, non-prescription drugs, or dietary supplements you use. Also tell them if you  smoke, drink alcohol, or use illegal drugs. Some items may interact with your medicine. What should I watch for while using this medication? Visit your doctor or health care professional for regular checks on your progress. During the first week, your symptoms may get worse, but then will improve as you continue your treatment. You may get hot flashes, increased bone pain, increased difficulty passing urine, or an aggravation of nerve symptoms. Discuss these effects with your doctor or health care professional, some of them may improve with continued use of this medicine. Male patients may experience a menstrual cycle or spotting during the first 2 months of therapy with this medicine. If this continues, contact your doctor or health care professional. This medicine may increase blood sugar. Ask your healthcare provider if changes in diet or medicines are needed if you have diabetes. What side effects may I notice from receiving this medication? Side effects that you should report to your doctor or health care professional as soon as possible: allergic reactions like skin rash, itching or hives, swelling of the face, lips, or tongue breathing problems chest pain depression or memory disorders pain in your legs or groin pain at site where injected severe headache signs and symptoms of high blood sugar such as being more thirsty or hungry or having to urinate more than normal. You may also feel very tired or have blurry vision swelling of the feet and legs visual changes vomiting Side effects that usually do not require medical attention (report to your doctor or health care professional if they continue or are bothersome): breast swelling or tenderness decrease in sex drive or performance diarrhea hot flashes loss of appetite muscle, joint, or bone pains nausea redness or irritation at site where injected skin problems or acne This list may not describe all possible side effects. Call  your doctor for medical advice about side effects. You may report side effects to FDA at 1-800-FDA-1088. Where should I keep my medication? Keep out of the reach of children. Store below 25 degrees C (77 degrees F). Do not freeze. Protect from light. Do not use if it is not clear or if there are particles present. Throw away any unused medicine after the expiration date. NOTE: This sheet is a summary. It may not cover all possible information. If you have questions about this medicine, talk to your doctor, pharmacist, or health care provider.  2023 Elsevier/Gold Standard (2021-03-03 00:00:00)

## 2021-11-07 ENCOUNTER — Other Ambulatory Visit (HOSPITAL_COMMUNITY): Payer: Self-pay

## 2021-11-13 ENCOUNTER — Other Ambulatory Visit (HOSPITAL_COMMUNITY): Payer: Self-pay

## 2021-11-14 ENCOUNTER — Other Ambulatory Visit (HOSPITAL_BASED_OUTPATIENT_CLINIC_OR_DEPARTMENT_OTHER): Payer: Self-pay

## 2021-11-14 ENCOUNTER — Other Ambulatory Visit (HOSPITAL_COMMUNITY): Payer: Self-pay

## 2021-11-15 ENCOUNTER — Other Ambulatory Visit (HOSPITAL_BASED_OUTPATIENT_CLINIC_OR_DEPARTMENT_OTHER): Payer: Self-pay

## 2021-11-15 ENCOUNTER — Telehealth: Payer: Self-pay

## 2021-11-15 ENCOUNTER — Other Ambulatory Visit: Payer: Self-pay | Admitting: Nurse Practitioner

## 2021-11-15 DIAGNOSIS — C61 Malignant neoplasm of prostate: Secondary | ICD-10-CM

## 2021-11-15 MED ORDER — OXYCODONE HCL 5 MG PO TABS
5.0000 mg | ORAL_TABLET | Freq: Four times a day (QID) | ORAL | 0 refills | Status: DC | PRN
  Filled 2021-11-15: qty 75, 10d supply, fill #0

## 2021-11-15 NOTE — Telephone Encounter (Signed)
Patient called in for a refill of his pain pill. Place the request on the Np's desk.

## 2021-11-16 ENCOUNTER — Other Ambulatory Visit (HOSPITAL_COMMUNITY): Payer: Self-pay

## 2021-11-22 ENCOUNTER — Other Ambulatory Visit (HOSPITAL_COMMUNITY): Payer: Self-pay

## 2021-11-24 ENCOUNTER — Inpatient Hospital Stay (HOSPITAL_BASED_OUTPATIENT_CLINIC_OR_DEPARTMENT_OTHER): Payer: BC Managed Care – PPO | Admitting: Oncology

## 2021-11-24 ENCOUNTER — Other Ambulatory Visit (HOSPITAL_BASED_OUTPATIENT_CLINIC_OR_DEPARTMENT_OTHER): Payer: Self-pay

## 2021-11-24 ENCOUNTER — Other Ambulatory Visit (HOSPITAL_COMMUNITY): Payer: Self-pay

## 2021-11-24 ENCOUNTER — Inpatient Hospital Stay: Payer: BC Managed Care – PPO | Attending: Nurse Practitioner

## 2021-11-24 ENCOUNTER — Encounter: Payer: Self-pay | Admitting: *Deleted

## 2021-11-24 VITALS — BP 130/67 | HR 74 | Temp 98.1°F | Resp 20 | Ht 62.0 in | Wt 144.8 lb

## 2021-11-24 DIAGNOSIS — F32A Depression, unspecified: Secondary | ICD-10-CM | POA: Diagnosis not present

## 2021-11-24 DIAGNOSIS — C61 Malignant neoplasm of prostate: Secondary | ICD-10-CM | POA: Insufficient documentation

## 2021-11-24 DIAGNOSIS — I252 Old myocardial infarction: Secondary | ICD-10-CM | POA: Insufficient documentation

## 2021-11-24 DIAGNOSIS — Z5111 Encounter for antineoplastic chemotherapy: Secondary | ICD-10-CM | POA: Diagnosis not present

## 2021-11-24 DIAGNOSIS — C7951 Secondary malignant neoplasm of bone: Secondary | ICD-10-CM | POA: Insufficient documentation

## 2021-11-24 DIAGNOSIS — K7460 Unspecified cirrhosis of liver: Secondary | ICD-10-CM | POA: Diagnosis not present

## 2021-11-24 DIAGNOSIS — M1612 Unilateral primary osteoarthritis, left hip: Secondary | ICD-10-CM | POA: Insufficient documentation

## 2021-11-24 DIAGNOSIS — R162 Hepatomegaly with splenomegaly, not elsewhere classified: Secondary | ICD-10-CM | POA: Insufficient documentation

## 2021-11-24 DIAGNOSIS — D696 Thrombocytopenia, unspecified: Secondary | ICD-10-CM | POA: Diagnosis not present

## 2021-11-24 DIAGNOSIS — F17200 Nicotine dependence, unspecified, uncomplicated: Secondary | ICD-10-CM | POA: Insufficient documentation

## 2021-11-24 DIAGNOSIS — Z8619 Personal history of other infectious and parasitic diseases: Secondary | ICD-10-CM | POA: Insufficient documentation

## 2021-11-24 DIAGNOSIS — M109 Gout, unspecified: Secondary | ICD-10-CM | POA: Diagnosis not present

## 2021-11-24 DIAGNOSIS — D649 Anemia, unspecified: Secondary | ICD-10-CM | POA: Diagnosis not present

## 2021-11-24 LAB — CMP (CANCER CENTER ONLY)
ALT: 8 U/L (ref 0–44)
AST: 19 U/L (ref 15–41)
Albumin: 3.9 g/dL (ref 3.5–5.0)
Alkaline Phosphatase: 264 U/L — ABNORMAL HIGH (ref 38–126)
Anion gap: 8 (ref 5–15)
BUN: 11 mg/dL (ref 6–20)
CO2: 19 mmol/L — ABNORMAL LOW (ref 22–32)
Calcium: 8.7 mg/dL — ABNORMAL LOW (ref 8.9–10.3)
Chloride: 111 mmol/L (ref 98–111)
Creatinine: 0.72 mg/dL (ref 0.61–1.24)
GFR, Estimated: >60 mL/min (ref >60)
Glucose, Bld: 111 mg/dL — ABNORMAL HIGH (ref 70–99)
Potassium: 4.5 mmol/L (ref 3.5–5.1)
Sodium: 138 mmol/L (ref 135–145)
Total Bilirubin: 0.7 mg/dL (ref 0.3–1.2)
Total Protein: 6.9 g/dL (ref 6.5–8.1)

## 2021-11-24 LAB — CBC WITH DIFFERENTIAL (CANCER CENTER ONLY)
Abs Immature Granulocytes: 0.05 10*3/uL (ref 0.00–0.07)
Basophils Absolute: 0 10*3/uL (ref 0.0–0.1)
Basophils Relative: 1 %
Eosinophils Absolute: 0 10*3/uL (ref 0.0–0.5)
Eosinophils Relative: 1 %
HCT: 27.8 % — ABNORMAL LOW (ref 39.0–52.0)
Hemoglobin: 9 g/dL — ABNORMAL LOW (ref 13.0–17.0)
Immature Granulocytes: 2 %
Lymphocytes Relative: 15 %
Lymphs Abs: 0.5 10*3/uL — ABNORMAL LOW (ref 0.7–4.0)
MCH: 28.9 pg (ref 26.0–34.0)
MCHC: 32.4 g/dL (ref 30.0–36.0)
MCV: 89.4 fL (ref 80.0–100.0)
Monocytes Absolute: 0.2 10*3/uL (ref 0.1–1.0)
Monocytes Relative: 6 %
Neutro Abs: 2.3 10*3/uL (ref 1.7–7.7)
Neutrophils Relative %: 75 %
Platelet Count: 111 10*3/uL — ABNORMAL LOW (ref 150–400)
RBC: 3.11 MIL/uL — ABNORMAL LOW (ref 4.22–5.81)
RDW: 19.5 % — ABNORMAL HIGH (ref 11.5–15.5)
WBC Count: 3 10*3/uL — ABNORMAL LOW (ref 4.0–10.5)
nRBC: 0.7 % — ABNORMAL HIGH (ref 0.0–0.2)

## 2021-11-24 MED ORDER — OXYCODONE HCL 5 MG PO TABS: 5.0000 mg | ORAL_TABLET | Freq: Four times a day (QID) | ORAL | 0 refills | Status: DC | PRN

## 2021-11-24 NOTE — Progress Notes (Signed)
Provided patient 1 case of Ensure.

## 2021-11-24 NOTE — Progress Notes (Signed)
Edgar Mooney   Diagnosis: Prostate cancer  INTERVAL HISTORY:   Edgar Mooney returns as scheduled.  He continues olaparib.  No rash or diarrhea.  He continues to have back pain.  He takes oxycodone approximately 4 times per day.  His appetite is poor.  Objective:  Vital signs in last 24 hours:  Blood pressure 130/67, pulse 74, temperature 98.1 F (36.7 C), temperature source Oral, resp. rate 20, height 5' 2" (1.575 m), weight 144 lb 12.8 oz (65.7 kg), SpO2 100 %.    HEENT: No thrush or ulcers Resp: Lungs clear bilaterally Cardio: Regular rate and rhythm GI: No hepatosplenomegaly, no mass, nontender Vascular: No leg edema   Lab Results:  Lab Results  Component Value Date   WBC 3.0 (L) 11/24/2021   HGB 9.0 (L) 11/24/2021   HCT 27.8 (L) 11/24/2021   MCV 89.4 11/24/2021   PLT 111 (L) 11/24/2021   NEUTROABS 2.3 11/24/2021    CMP  Lab Results  Component Value Date   NA 138 11/24/2021   K 4.5 11/24/2021   CL 111 11/24/2021   CO2 19 (L) 11/24/2021   GLUCOSE 111 (H) 11/24/2021   BUN 11 11/24/2021   CREATININE 0.72 11/24/2021   CALCIUM 8.7 (L) 11/24/2021   PROT 6.9 11/24/2021   ALBUMIN 3.9 11/24/2021   AST 19 11/24/2021   ALT 8 11/24/2021   ALKPHOS 264 (H) 11/24/2021   BILITOT 0.7 11/24/2021   GFRNONAA >60 11/24/2021   GFRAA 121 12/19/2017     Medications: I have reviewed the patient's current medications.   Assessment/Plan: Metastatic prostate cancer - Lytic bone lesions with retroperitoneal adenopathy concerning for metastatic prostate cancer -03/03/2020-CTA chest/abdomen/pelvis widespread osseous metastatic disease and lower retroperitoneal adenopathy, pathologic right anterior third rib fracture, probable spinal canal tumor at the level of S1, -MRI cervical, thoracic, and lumbar spine 03/03/2020-diffuse osseous metastatic disease, ventral epidural tumor impinging on right S1 nerve root, extraosseous tumor at the bilateral  ilium, asymmetric enhancing material at the right C5-6 canal-right  -03/03/2020-PSA 763 -Degarelix 03/04/2020 -Abiraterone/prednisone 03/14/2020 -Every 33-monthLupron 04/01/2020 -04/01/2020 PSA 36.5 -05/31/2020 PSA 1.2 -06/28/2020 PSA 1.0 -12/14/2020 PSA 6.4 -01/13/2021 PSA 10.6 -04/26/2021 PSA  45.2 -05/16/2021 PSA 70 -Abiraterone/prednisone discontinued 05/16/2021 -05/16/2021 left hip x-ray-multifocal sclerotic metastasis throughout the pelvis, confluent involving the left acetabulum.  No acute or pathologic fracture.  Left hip osteoarthritis. -Cycle 1 docetaxel 05/31/2021 -05/31/2021 PSA 113 -Palliative radiation to the left acetabulum 06/08/2021 - 06/20/2021 -Cycle 2 docetaxel 06/21/2021 -06/21/2021 PSA improved at 68 -Cycle 3 docetaxel 07/12/2021 -07/12/2021 PSA improved at 52 -Cycle 4 docetaxel 08/02/2021 -Cycle 5 docetaxel 08/22/2021 -CT chest 09/16/2021-negative for pulmonary embolism, right liver mass, extensive bone metastases -Cycle 6 docetaxel 09/28/2021 -PSA increased 09/28/2021 -Olaparib 10/24/2021, patient decreased dose to 1 tablet twice daily on 10/31/2021 due to muscle spasms; dose resumed at prescribed amount 11/06/2021   2.  Severe anemia-likely secondary to metastatic prostate cancer involving the bones 3.  Mild thrombocytopenia 4.  Cirrhosis with splenomegaly 5.  History of hepatitis C 6.  History of polysubstance abuse 7.  Depression 8.  Tobacco dependence 9.  Right arm weakness, right facial numbness-potentially related to nerve root compromise from metastatic bone lesions 10.  Pain secondary to #1 11.  Extensive bone metastases-every 341-monthometa starting 04/01/2020 12.  Gout-acute flare right wrist 04/01/2020 treated with indomethacin, allopurinol resumed 13.  Right hepatic lobe [facility] with [facility] AFP [facility]-hypoenhancing inferior right liver mass, segment 6, occluding  or compressing the adjacent right portal vein branch, intrahepatic  cholangiocarcinoma favored with differential including hepatocellular carcinoma, Li-Rads M Y 90 planning study 11-22 Y90 right lobe of liver 06/26/2021 14.  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  Presentation to the [facility] emergency room 09/16/2021 with chest pain-transfer to [facility] on a morphine drip and scheduled Ativan       Disposition: Edgar Mooney appears unchanged.  He continues olaparib.  He appears to be tolerating the treatment well.  He continues to have back pain requiring frequent use of oxycodone.  We will follow-up on the PSA from today.  We refilled his prescription for oxycodone.  He will return for an office visit in 2 weeks. We will consider changing to a different systemic therapy if the PSA is consistently rising.  Betsy Coder, MD  11/24/2021  12:33 PM

## 2021-11-25 LAB — PROSTATE-SPECIFIC AG, SERUM (LABCORP): Prostate Specific Ag, Serum: 464 ng/mL — ABNORMAL HIGH (ref 0.0–4.0)

## 2021-11-28 ENCOUNTER — Other Ambulatory Visit: Payer: Self-pay | Admitting: Oncology

## 2021-11-28 ENCOUNTER — Other Ambulatory Visit (HOSPITAL_COMMUNITY): Payer: Self-pay

## 2021-11-30 ENCOUNTER — Encounter: Payer: Self-pay | Admitting: Oncology

## 2021-11-30 ENCOUNTER — Other Ambulatory Visit (HOSPITAL_COMMUNITY): Payer: Self-pay

## 2021-11-30 MED ORDER — OLAPARIB 150 MG PO TABS
300.0000 mg | ORAL_TABLET | Freq: Two times a day (BID) | ORAL | 0 refills | Status: DC
  Filled 2021-11-30: qty 120, 30d supply, fill #0

## 2021-11-30 MED ORDER — TAMSULOSIN HCL 0.4 MG PO CAPS
0.4000 mg | ORAL_CAPSULE | Freq: Every day | ORAL | 0 refills | Status: DC
  Filled 2021-11-30: qty 90, 90d supply, fill #0

## 2021-12-01 ENCOUNTER — Other Ambulatory Visit (HOSPITAL_COMMUNITY): Payer: Self-pay

## 2021-12-04 ENCOUNTER — Other Ambulatory Visit: Payer: Self-pay | Admitting: Nurse Practitioner

## 2021-12-04 ENCOUNTER — Telehealth: Payer: Self-pay

## 2021-12-04 DIAGNOSIS — C61 Malignant neoplasm of prostate: Secondary | ICD-10-CM

## 2021-12-04 MED ORDER — OXYCODONE HCL 5 MG PO TABS: 5.0000 mg | ORAL_TABLET | Freq: Four times a day (QID) | ORAL | 0 refills | Status: DC | PRN

## 2021-12-04 NOTE — Telephone Encounter (Signed)
Patient called in and requested a refill of his Oxy IR, the requested was place on the Np's desk

## 2021-12-07 ENCOUNTER — Other Ambulatory Visit (HOSPITAL_COMMUNITY): Payer: Self-pay

## 2021-12-08 ENCOUNTER — Other Ambulatory Visit: Payer: Self-pay

## 2021-12-08 ENCOUNTER — Inpatient Hospital Stay (HOSPITAL_BASED_OUTPATIENT_CLINIC_OR_DEPARTMENT_OTHER): Payer: BC Managed Care – PPO | Admitting: Nurse Practitioner

## 2021-12-08 ENCOUNTER — Encounter: Payer: Self-pay | Admitting: Nurse Practitioner

## 2021-12-08 ENCOUNTER — Inpatient Hospital Stay: Payer: BC Managed Care – PPO

## 2021-12-08 ENCOUNTER — Other Ambulatory Visit (HOSPITAL_BASED_OUTPATIENT_CLINIC_OR_DEPARTMENT_OTHER): Payer: Self-pay

## 2021-12-08 VITALS — BP 153/68 | HR 81 | Temp 98.1°F | Resp 20 | Ht 62.0 in | Wt 151.4 lb

## 2021-12-08 DIAGNOSIS — F17200 Nicotine dependence, unspecified, uncomplicated: Secondary | ICD-10-CM | POA: Diagnosis not present

## 2021-12-08 DIAGNOSIS — C61 Malignant neoplasm of prostate: Secondary | ICD-10-CM

## 2021-12-08 DIAGNOSIS — Z5111 Encounter for antineoplastic chemotherapy: Secondary | ICD-10-CM | POA: Diagnosis not present

## 2021-12-08 DIAGNOSIS — C7951 Secondary malignant neoplasm of bone: Secondary | ICD-10-CM | POA: Diagnosis not present

## 2021-12-08 DIAGNOSIS — M109 Gout, unspecified: Secondary | ICD-10-CM | POA: Diagnosis not present

## 2021-12-08 DIAGNOSIS — D649 Anemia, unspecified: Secondary | ICD-10-CM | POA: Diagnosis not present

## 2021-12-08 DIAGNOSIS — F32A Depression, unspecified: Secondary | ICD-10-CM | POA: Diagnosis not present

## 2021-12-08 DIAGNOSIS — I252 Old myocardial infarction: Secondary | ICD-10-CM | POA: Diagnosis not present

## 2021-12-08 DIAGNOSIS — M1612 Unilateral primary osteoarthritis, left hip: Secondary | ICD-10-CM | POA: Diagnosis not present

## 2021-12-08 DIAGNOSIS — D696 Thrombocytopenia, unspecified: Secondary | ICD-10-CM | POA: Diagnosis not present

## 2021-12-08 DIAGNOSIS — K7460 Unspecified cirrhosis of liver: Secondary | ICD-10-CM | POA: Diagnosis not present

## 2021-12-08 DIAGNOSIS — Z8619 Personal history of other infectious and parasitic diseases: Secondary | ICD-10-CM | POA: Diagnosis not present

## 2021-12-08 DIAGNOSIS — R162 Hepatomegaly with splenomegaly, not elsewhere classified: Secondary | ICD-10-CM | POA: Diagnosis not present

## 2021-12-08 LAB — CBC WITH DIFFERENTIAL (CANCER CENTER ONLY)
Abs Immature Granulocytes: 0.08 10*3/uL — ABNORMAL HIGH (ref 0.00–0.07)
Basophils Absolute: 0 10*3/uL (ref 0.0–0.1)
Basophils Relative: 0 %
Eosinophils Absolute: 0 10*3/uL (ref 0.0–0.5)
Eosinophils Relative: 1 %
HCT: 23.9 % — ABNORMAL LOW (ref 39.0–52.0)
Hemoglobin: 7.9 g/dL — ABNORMAL LOW (ref 13.0–17.0)
Immature Granulocytes: 2 %
Lymphocytes Relative: 8 %
Lymphs Abs: 0.3 10*3/uL — ABNORMAL LOW (ref 0.7–4.0)
MCH: 29.7 pg (ref 26.0–34.0)
MCHC: 33.1 g/dL (ref 30.0–36.0)
MCV: 89.8 fL (ref 80.0–100.0)
Monocytes Absolute: 0.2 10*3/uL (ref 0.1–1.0)
Monocytes Relative: 5 %
Neutro Abs: 3.4 10*3/uL (ref 1.7–7.7)
Neutrophils Relative %: 84 %
Platelet Count: 119 10*3/uL — ABNORMAL LOW (ref 150–400)
RBC: 2.66 MIL/uL — ABNORMAL LOW (ref 4.22–5.81)
RDW: 22.3 % — ABNORMAL HIGH (ref 11.5–15.5)
WBC Count: 4.1 10*3/uL (ref 4.0–10.5)
nRBC: 0 % (ref 0.0–0.2)

## 2021-12-08 LAB — CMP (CANCER CENTER ONLY)
ALT: 6 U/L (ref 0–44)
AST: 16 U/L (ref 15–41)
Albumin: 3.9 g/dL (ref 3.5–5.0)
Alkaline Phosphatase: 220 U/L — ABNORMAL HIGH (ref 38–126)
Anion gap: 9 (ref 5–15)
BUN: 11 mg/dL (ref 6–20)
CO2: 20 mmol/L — ABNORMAL LOW (ref 22–32)
Calcium: 8.9 mg/dL (ref 8.9–10.3)
Chloride: 105 mmol/L (ref 98–111)
Creatinine: 0.75 mg/dL (ref 0.61–1.24)
GFR, Estimated: >60 mL/min (ref >60)
Glucose, Bld: 133 mg/dL — ABNORMAL HIGH (ref 70–99)
Potassium: 3.8 mmol/L (ref 3.5–5.1)
Sodium: 134 mmol/L — ABNORMAL LOW (ref 135–145)
Total Bilirubin: 0.8 mg/dL (ref 0.3–1.2)
Total Protein: 6.8 g/dL (ref 6.5–8.1)

## 2021-12-08 MED ORDER — ONDANSETRON HCL 8 MG PO TABS
8.0000 mg | ORAL_TABLET | Freq: Three times a day (TID) | ORAL | 1 refills | Status: DC | PRN
  Filled 2021-12-08: qty 30, 10d supply, fill #0

## 2021-12-08 NOTE — Progress Notes (Signed)
[name] OFFICE PROGRESS NOTE   Diagnosis: Prostate cancer  INTERVAL HISTORY:   Edgar Mooney returns as scheduled.  He continues olaparib.  He has occasional nausea/vomiting.  No mouth sores.  No diarrhea.  No rash.  Pain overall is stable.  He continues oxycodone mainly 4 times a day.  He denies bleeding.  He notes dyspnea on exertion.  Objective:  Vital signs in last 24 hours:  Blood pressure (!) 153/68, pulse 81, temperature 98.1 F (36.7 C), temperature source Oral, resp. rate 20, height $RemoveBe'5\' 2"'ZiUphnzdT$  (1.575 m), weight 151 lb 6.4 oz (68.7 kg), SpO2 100 %.    HEENT: No thrush or ulcers. Resp: Lungs clear bilaterally. Cardio: Regular rate and rhythm. GI: Abdomen soft and nontender.  No hepatosplenomegaly. Vascular: Trace bilateral pretibial edema. Skin: No rash.   Lab Results:  Lab Results  Component Value Date   WBC 4.1 12/08/2021   HGB 7.9 (L) 12/08/2021   HCT 23.9 (L) 12/08/2021   MCV 89.8 12/08/2021   PLT 119 (L) 12/08/2021   NEUTROABS 3.4 12/08/2021    Imaging:  No results found.  Medications: I have reviewed the patient's current medications.  Assessment/Plan: Metastatic prostate cancer - Lytic bone lesions with retroperitoneal adenopathy concerning for metastatic prostate cancer -03/03/2020-CTA chest/abdomen/pelvis widespread osseous metastatic disease and lower retroperitoneal adenopathy, pathologic right anterior third rib fracture, probable spinal canal tumor at the level of S1, -MRI cervical, thoracic, and lumbar spine 03/03/2020-diffuse osseous metastatic disease, ventral epidural tumor impinging on right S1 nerve root, extraosseous tumor at the bilateral ilium, asymmetric enhancing material at the right C5-6 canal-right  -03/03/2020-PSA 763 -Degarelix 03/04/2020 -Abiraterone/prednisone 03/14/2020 -Every 73-month Lupron 04/01/2020 -04/01/2020 PSA 36.5 -05/31/2020 PSA 1.2 -06/28/2020 PSA 1.0 -12/14/2020 PSA 6.4 -01/13/2021 PSA  10.6 -04/26/2021 PSA  45.2 -05/16/2021 PSA 70 -Abiraterone/prednisone discontinued 05/16/2021 -05/16/2021 left hip x-ray-multifocal sclerotic metastasis throughout the pelvis, confluent involving the left acetabulum.  No acute or pathologic fracture.  Left hip osteoarthritis. -Cycle 1 docetaxel 05/31/2021 -05/31/2021 PSA 113 -Palliative radiation to the left acetabulum 06/08/2021 - 06/20/2021 -Cycle 2 docetaxel 06/21/2021 -06/21/2021 PSA improved at 68 -Cycle 3 docetaxel 07/12/2021 -07/12/2021 PSA improved at 52 -Cycle 4 docetaxel 08/02/2021 -Cycle 5 docetaxel 08/22/2021 -CT chest 09/16/2021-negative for pulmonary embolism, right liver mass, extensive bone metastases -Cycle 6 docetaxel 09/28/2021 -PSA increased 09/28/2021 -Olaparib 10/24/2021, patient decreased dose to 1 tablet twice daily on 10/31/2021 due to muscle spasms; dose resumed at prescribed amount 11/06/2021 -PSA increased 11/24/2021   2.  Severe anemia-likely secondary to metastatic prostate cancer involving the bones 3.  Mild thrombocytopenia 4.  Cirrhosis with splenomegaly 5.  History of hepatitis C 6.  History of polysubstance abuse 7.  Depression 8.  Tobacco dependence 9.  Right arm weakness, right facial numbness-potentially related to nerve root compromise from metastatic bone lesions 10.  Pain secondary to #1 11.  Extensive bone metastases-every 25-month Zometa starting 04/01/2020 12.  Gout-acute flare right wrist 04/01/2020 treated with indomethacin, allopurinol resumed 13.  Right hepatic lobe mass with elevated AFP concerning for [facility] MRI abdomen 12/21/2020-hypoenhancing inferior right liver mass, segment 6, occluding or compressing the adjacent right portal vein branch, intrahepatic cholangiocarcinoma favored with differential including hepatocellular carcinoma, Li-Rads M Y 90 planning study 11-22 Y90 right lobe of liver 06/26/2021 14.  Admission 12/19/2020 with an NSTEMI Catheterization 12/21/2020-severe multivessel CAD, 99% proximal LAD  stenosis, LAD stent placed 15. 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  Presentation to the [facility] emergency room  09/16/2021 with chest pain-transfer to [facility] on a morphine drip and scheduled Ativan  Disposition: Edgar Mooney appears unchanged.  He continues olaparib.  Overall seems to be tolerating well.  He has persistent back pain, takes oxycodone.  Last PSA was higher.  We will follow-up on the value from today.  We are referring him for a PET PSMA scan.  CBC from today reviewed.  He has progressive anemia, symptomatic with dyspnea on exertion.  We discussed a blood transfusion.  He understands the risk of an allergic reaction, infection.  He agrees to proceed.  He will return for lab and follow-up in 3 weeks.  We are available to see him sooner if needed.  Plan reviewed with Dr. Benay Spice.  Ned Card ANP/GNP-BC   12/08/2021  1:53 PM

## 2021-12-09 LAB — PROSTATE-SPECIFIC AG, SERUM (LABCORP): Prostate Specific Ag, Serum: 647 ng/mL — ABNORMAL HIGH (ref 0.0–4.0)

## 2021-12-12 ENCOUNTER — Telehealth: Payer: Self-pay

## 2021-12-12 ENCOUNTER — Other Ambulatory Visit: Payer: Self-pay | Admitting: Nurse Practitioner

## 2021-12-12 DIAGNOSIS — C61 Malignant neoplasm of prostate: Secondary | ICD-10-CM

## 2021-12-12 MED ORDER — OXYCODONE HCL 5 MG PO TABS: 5.0000 mg | ORAL_TABLET | Freq: Four times a day (QID) | ORAL | 0 refills | Status: DC | PRN

## 2021-12-12 NOTE — Telephone Encounter (Signed)
VM for pt to call back to [facility]

## 2021-12-12 NOTE — Telephone Encounter (Signed)
-----   Message from Owens Shark, NP sent at 12/12/2021 11:10 AM EDT ----- Please let him know the PSA is higher, proceed with PSMA scan, can stop olaparib

## 2021-12-13 ENCOUNTER — Other Ambulatory Visit: Payer: Self-pay | Admitting: Nurse Practitioner

## 2021-12-13 ENCOUNTER — Other Ambulatory Visit (HOSPITAL_BASED_OUTPATIENT_CLINIC_OR_DEPARTMENT_OTHER): Payer: Self-pay

## 2021-12-13 DIAGNOSIS — C61 Malignant neoplasm of prostate: Secondary | ICD-10-CM

## 2021-12-14 ENCOUNTER — Other Ambulatory Visit (HOSPITAL_COMMUNITY): Payer: Self-pay

## 2021-12-14 ENCOUNTER — Other Ambulatory Visit: Payer: Self-pay

## 2021-12-14 ENCOUNTER — Inpatient Hospital Stay: Payer: BC Managed Care – PPO

## 2021-12-14 ENCOUNTER — Other Ambulatory Visit: Payer: Self-pay | Admitting: Nurse Practitioner

## 2021-12-14 ENCOUNTER — Other Ambulatory Visit (HOSPITAL_BASED_OUTPATIENT_CLINIC_OR_DEPARTMENT_OTHER): Payer: Self-pay

## 2021-12-14 VITALS — BP 151/76 | HR 71 | Temp 98.8°F | Resp 18

## 2021-12-14 DIAGNOSIS — F32A Depression, unspecified: Secondary | ICD-10-CM | POA: Diagnosis not present

## 2021-12-14 DIAGNOSIS — C61 Malignant neoplasm of prostate: Secondary | ICD-10-CM | POA: Diagnosis not present

## 2021-12-14 DIAGNOSIS — M545 Low back pain, unspecified: Secondary | ICD-10-CM

## 2021-12-14 DIAGNOSIS — M109 Gout, unspecified: Secondary | ICD-10-CM | POA: Diagnosis not present

## 2021-12-14 DIAGNOSIS — Z8619 Personal history of other infectious and parasitic diseases: Secondary | ICD-10-CM | POA: Diagnosis not present

## 2021-12-14 DIAGNOSIS — D649 Anemia, unspecified: Secondary | ICD-10-CM | POA: Diagnosis not present

## 2021-12-14 DIAGNOSIS — D696 Thrombocytopenia, unspecified: Secondary | ICD-10-CM | POA: Diagnosis not present

## 2021-12-14 DIAGNOSIS — M1612 Unilateral primary osteoarthritis, left hip: Secondary | ICD-10-CM | POA: Diagnosis not present

## 2021-12-14 DIAGNOSIS — K7460 Unspecified cirrhosis of liver: Secondary | ICD-10-CM | POA: Diagnosis not present

## 2021-12-14 DIAGNOSIS — R162 Hepatomegaly with splenomegaly, not elsewhere classified: Secondary | ICD-10-CM | POA: Diagnosis not present

## 2021-12-14 DIAGNOSIS — I252 Old myocardial infarction: Secondary | ICD-10-CM | POA: Diagnosis not present

## 2021-12-14 DIAGNOSIS — C7951 Secondary malignant neoplasm of bone: Secondary | ICD-10-CM | POA: Diagnosis not present

## 2021-12-14 DIAGNOSIS — F17200 Nicotine dependence, unspecified, uncomplicated: Secondary | ICD-10-CM | POA: Diagnosis not present

## 2021-12-14 DIAGNOSIS — Z5111 Encounter for antineoplastic chemotherapy: Secondary | ICD-10-CM | POA: Diagnosis not present

## 2021-12-14 LAB — PREPARE RBC (CROSSMATCH)

## 2021-12-14 MED ORDER — OXYCODONE HCL 5 MG PO TABS
10.0000 mg | ORAL_TABLET | Freq: Once | ORAL | Status: AC
  Administered 2021-12-14: 10 mg via ORAL
  Filled 2021-12-14: qty 2

## 2021-12-14 MED ORDER — SODIUM CHLORIDE 0.9% IV SOLUTION
250.0000 mL | Freq: Once | INTRAVENOUS | Status: AC
  Administered 2021-12-14: 250 mL via INTRAVENOUS

## 2021-12-14 NOTE — Patient Instructions (Signed)

## 2021-12-15 ENCOUNTER — Other Ambulatory Visit (HOSPITAL_BASED_OUTPATIENT_CLINIC_OR_DEPARTMENT_OTHER): Payer: Self-pay

## 2021-12-15 ENCOUNTER — Other Ambulatory Visit: Payer: Self-pay | Admitting: Nurse Practitioner

## 2021-12-15 ENCOUNTER — Telehealth: Payer: Self-pay

## 2021-12-15 DIAGNOSIS — C61 Malignant neoplasm of prostate: Secondary | ICD-10-CM

## 2021-12-15 LAB — BPAM RBC
Blood Product Expiration Date: 202309242359
Blood Product Expiration Date: 202309242359
ISSUE DATE / TIME: 202308311017
ISSUE DATE / TIME: 202308311017
Unit Type and Rh: 6200
Unit Type and Rh: 6200

## 2021-12-15 LAB — TYPE AND SCREEN
ABO/RH(D): A POS
Antibody Screen: NEGATIVE
Unit division: 0
Unit division: 0

## 2021-12-15 NOTE — Telephone Encounter (Signed)
I called [name] and ask him to update his [employer] address. He stated he will do it as soon as he get off the phone.

## 2021-12-19 ENCOUNTER — Other Ambulatory Visit (HOSPITAL_BASED_OUTPATIENT_CLINIC_OR_DEPARTMENT_OTHER): Payer: Self-pay

## 2021-12-19 ENCOUNTER — Other Ambulatory Visit: Payer: Self-pay | Admitting: Nurse Practitioner

## 2021-12-19 DIAGNOSIS — C61 Malignant neoplasm of prostate: Secondary | ICD-10-CM

## 2021-12-20 ENCOUNTER — Encounter: Payer: Self-pay | Admitting: Oncology

## 2021-12-20 ENCOUNTER — Encounter (HOSPITAL_COMMUNITY): Admission: RE | Admit: 2021-12-20 | Payer: BC Managed Care – PPO | Source: Ambulatory Visit

## 2021-12-20 ENCOUNTER — Other Ambulatory Visit (HOSPITAL_BASED_OUTPATIENT_CLINIC_OR_DEPARTMENT_OTHER): Payer: Self-pay

## 2021-12-20 ENCOUNTER — Other Ambulatory Visit: Payer: Self-pay | Admitting: Nurse Practitioner

## 2021-12-20 DIAGNOSIS — C61 Malignant neoplasm of prostate: Secondary | ICD-10-CM

## 2021-12-20 MED ORDER — OXYCODONE HCL 5 MG PO TABS
5.0000 mg | ORAL_TABLET | Freq: Four times a day (QID) | ORAL | 0 refills | Status: DC | PRN
  Filled 2021-12-20: qty 75, 10d supply, fill #0

## 2021-12-21 ENCOUNTER — Other Ambulatory Visit (HOSPITAL_BASED_OUTPATIENT_CLINIC_OR_DEPARTMENT_OTHER): Payer: Self-pay

## 2021-12-22 ENCOUNTER — Other Ambulatory Visit (HOSPITAL_COMMUNITY): Payer: BC Managed Care – PPO

## 2021-12-25 ENCOUNTER — Other Ambulatory Visit (HOSPITAL_BASED_OUTPATIENT_CLINIC_OR_DEPARTMENT_OTHER): Payer: Self-pay

## 2021-12-29 ENCOUNTER — Other Ambulatory Visit: Payer: Self-pay

## 2021-12-29 ENCOUNTER — Inpatient Hospital Stay: Payer: BC Managed Care – PPO | Attending: Nurse Practitioner

## 2021-12-29 ENCOUNTER — Inpatient Hospital Stay: Payer: BC Managed Care – PPO

## 2021-12-29 ENCOUNTER — Other Ambulatory Visit: Payer: Self-pay | Admitting: *Deleted

## 2021-12-29 ENCOUNTER — Other Ambulatory Visit (HOSPITAL_COMMUNITY): Payer: Self-pay

## 2021-12-29 ENCOUNTER — Encounter: Payer: Self-pay | Admitting: Nurse Practitioner

## 2021-12-29 ENCOUNTER — Inpatient Hospital Stay (HOSPITAL_BASED_OUTPATIENT_CLINIC_OR_DEPARTMENT_OTHER): Payer: BC Managed Care – PPO | Admitting: Nurse Practitioner

## 2021-12-29 ENCOUNTER — Telehealth: Payer: Self-pay

## 2021-12-29 ENCOUNTER — Other Ambulatory Visit (HOSPITAL_BASED_OUTPATIENT_CLINIC_OR_DEPARTMENT_OTHER): Payer: Self-pay

## 2021-12-29 VITALS — BP 115/66 | HR 89 | Resp 18

## 2021-12-29 VITALS — BP 108/58 | HR 76 | Temp 98.2°F | Resp 18 | Ht 62.0 in | Wt 143.0 lb

## 2021-12-29 DIAGNOSIS — M1612 Unilateral primary osteoarthritis, left hip: Secondary | ICD-10-CM | POA: Diagnosis not present

## 2021-12-29 DIAGNOSIS — C61 Malignant neoplasm of prostate: Secondary | ICD-10-CM

## 2021-12-29 DIAGNOSIS — F32A Depression, unspecified: Secondary | ICD-10-CM | POA: Insufficient documentation

## 2021-12-29 DIAGNOSIS — F17200 Nicotine dependence, unspecified, uncomplicated: Secondary | ICD-10-CM | POA: Insufficient documentation

## 2021-12-29 DIAGNOSIS — M109 Gout, unspecified: Secondary | ICD-10-CM | POA: Insufficient documentation

## 2021-12-29 DIAGNOSIS — D696 Thrombocytopenia, unspecified: Secondary | ICD-10-CM | POA: Insufficient documentation

## 2021-12-29 DIAGNOSIS — D649 Anemia, unspecified: Secondary | ICD-10-CM | POA: Diagnosis not present

## 2021-12-29 DIAGNOSIS — D509 Iron deficiency anemia, unspecified: Secondary | ICD-10-CM

## 2021-12-29 DIAGNOSIS — I252 Old myocardial infarction: Secondary | ICD-10-CM | POA: Diagnosis not present

## 2021-12-29 DIAGNOSIS — C7951 Secondary malignant neoplasm of bone: Secondary | ICD-10-CM | POA: Diagnosis not present

## 2021-12-29 DIAGNOSIS — G8929 Other chronic pain: Secondary | ICD-10-CM

## 2021-12-29 DIAGNOSIS — K7460 Unspecified cirrhosis of liver: Secondary | ICD-10-CM | POA: Diagnosis not present

## 2021-12-29 DIAGNOSIS — R162 Hepatomegaly with splenomegaly, not elsewhere classified: Secondary | ICD-10-CM | POA: Diagnosis not present

## 2021-12-29 DIAGNOSIS — Z8619 Personal history of other infectious and parasitic diseases: Secondary | ICD-10-CM | POA: Insufficient documentation

## 2021-12-29 LAB — SAMPLE TO BLOOD BANK

## 2021-12-29 LAB — CBC WITH DIFFERENTIAL (CANCER CENTER ONLY)
Abs Immature Granulocytes: 0.03 10*3/uL (ref 0.00–0.07)
Basophils Absolute: 0 10*3/uL (ref 0.0–0.1)
Basophils Relative: 0 %
Eosinophils Absolute: 0 10*3/uL (ref 0.0–0.5)
Eosinophils Relative: 0 %
HCT: 26.4 % — ABNORMAL LOW (ref 39.0–52.0)
Hemoglobin: 8.9 g/dL — ABNORMAL LOW (ref 13.0–17.0)
Immature Granulocytes: 1 %
Lymphocytes Relative: 5 %
Lymphs Abs: 0.2 10*3/uL — ABNORMAL LOW (ref 0.7–4.0)
MCH: 30.7 pg (ref 26.0–34.0)
MCHC: 33.7 g/dL (ref 30.0–36.0)
MCV: 91 fL (ref 80.0–100.0)
Monocytes Absolute: 0.4 10*3/uL (ref 0.1–1.0)
Monocytes Relative: 11 %
Neutro Abs: 3 10*3/uL (ref 1.7–7.7)
Neutrophils Relative %: 83 %
Platelet Count: 127 10*3/uL — ABNORMAL LOW (ref 150–400)
RBC: 2.9 MIL/uL — ABNORMAL LOW (ref 4.22–5.81)
RDW: 19.2 % — ABNORMAL HIGH (ref 11.5–15.5)
WBC Count: 3.6 10*3/uL — ABNORMAL LOW (ref 4.0–10.5)
nRBC: 0 % (ref 0.0–0.2)

## 2021-12-29 LAB — BASIC METABOLIC PANEL - CANCER CENTER ONLY
Anion gap: 10 (ref 5–15)
BUN: 17 mg/dL (ref 6–20)
CO2: 20 mmol/L — ABNORMAL LOW (ref 22–32)
Calcium: 9.3 mg/dL (ref 8.9–10.3)
Chloride: 105 mmol/L (ref 98–111)
Creatinine: 0.76 mg/dL (ref 0.61–1.24)
GFR, Estimated: >60 mL/min (ref >60)
Glucose, Bld: 125 mg/dL — ABNORMAL HIGH (ref 70–99)
Potassium: 3.9 mmol/L (ref 3.5–5.1)
Sodium: 135 mmol/L (ref 135–145)

## 2021-12-29 LAB — PREPARE RBC (CROSSMATCH)

## 2021-12-29 MED ORDER — ALLOPURINOL 100 MG PO TABS
ORAL_TABLET | ORAL | 1 refills | Status: DC
  Filled 2021-12-29: qty 90, 90d supply, fill #0

## 2021-12-29 MED ORDER — ASPIRIN 81 MG PO CHEW
81.0000 mg | CHEWABLE_TABLET | Freq: Every day | ORAL | 3 refills | Status: DC
  Filled 2021-12-29: qty 90, 90d supply, fill #0

## 2021-12-29 MED ORDER — SODIUM CHLORIDE 0.9 % IV SOLN: INTRAVENOUS | Status: DC

## 2021-12-29 MED ORDER — SODIUM CHLORIDE 0.9 % IV SOLN: INTRAVENOUS | Status: AC

## 2021-12-29 MED ORDER — OXYCODONE HCL 5 MG PO TABS
10.0000 mg | ORAL_TABLET | Freq: Once | ORAL | Status: AC
  Administered 2021-12-29: 10 mg via ORAL
  Filled 2021-12-29: qty 2

## 2021-12-29 NOTE — Telephone Encounter (Signed)
[name] called in and states he's feeling terrible as if his blood is low. I advice the patient to come in for lab. The appointment is schedule and the orders are in

## 2021-12-29 NOTE — Progress Notes (Signed)
Confirmed with blood bank that they can see orders for Monday. DASH called and will pick up on 9/18 at 11:15 from [facility] and deliver stat. Faxed blood ticket to blood bank.

## 2021-12-29 NOTE — Progress Notes (Signed)
Placed orders for 1 unit blood on 01/01/22.

## 2021-12-29 NOTE — Patient Instructions (Signed)
Dehydration, Adult Dehydration is a condition in which there is not enough water or other fluids in the body. This happens when a person loses more fluids than he or she takes in. Important organs, such as the kidneys, brain, and heart, cannot function without a proper amount of fluids. Any loss of fluids from the body can lead to dehydration. Dehydration can be mild, moderate, or severe. It should be treated right away to prevent it from becoming severe. What are the causes? Dehydration may be caused by: Conditions that cause loss of water or other fluids, such as diarrhea, vomiting, or sweating or urinating a lot. Not drinking enough fluids, especially when you are ill or doing activities that require a lot of energy. Other illnesses and conditions, such as fever or infection. Certain medicines, such as medicines that remove excess fluid from the body (diuretics). Lack of safe drinking water. Not being able to get enough water and food. What increases the risk? The following factors may make you more likely to develop this condition: Having a long-term (chronic) illness that has not been treated properly, such as diabetes, heart disease, or kidney disease. Being 65 years of age or older. Having a disability. Living in a place that is high in altitude, where thinner, drier air causes more fluid loss. Doing exercises that put stress on your body for a long time (endurance sports). What are the signs or symptoms? Symptoms of dehydration depend on how severe it is. Mild or moderate dehydration Thirst. Dry lips or dry mouth. Dizziness or light-headedness, especially when standing up from a seated position. Muscle cramps. Dark urine. Urine may be the color of tea. Less urine or tears produced than usual. Headache. Severe dehydration Changes in skin. Your skin may be cold and clammy, blotchy, or pale. Your skin also may not return to normal after being lightly pinched and released. Little or  no tears, urine, or sweat. Changes in vital signs, such as rapid breathing and low blood pressure. Your pulse may be weak or may be faster than 100 beats a minute when you are sitting still. Other changes, such as: Feeling very thirsty. Sunken eyes. Cold hands and feet. Confusion. Being very tired (lethargic) or having trouble waking from sleep. Short-term weight loss. Loss of consciousness. How is this diagnosed? This condition is diagnosed based on your symptoms and a physical exam. You may have blood and urine tests to help confirm the diagnosis. How is this treated? Treatment for this condition depends on how severe it is. Treatment should be started right away. Do not wait until dehydration becomes severe. Severe dehydration is an emergency and needs to be treated in a hospital. Mild or moderate dehydration can be treated at home. You may be asked to: Drink more fluids. Drink an oral rehydration solution (ORS). This drink helps restore proper amounts of fluids and salts and minerals in the blood (electrolytes). Severe dehydration can be treated: With IV fluids. By correcting abnormal levels of electrolytes. This is often done by giving electrolytes through a tube that is passed through your nose and into your stomach (nasogastric tube, or NG tube). By treating the underlying cause of dehydration. Follow these instructions at home: Oral rehydration solution If told by your health care provider, drink an ORS: Make an ORS by following instructions on the package. Start by drinking small amounts, about  cup (120 mL) every 5-10 minutes. Slowly increase how much you drink until you have taken the amount recommended by your health   care provider. Eating and drinking        Drink enough clear fluid to keep your urine pale yellow. If you were told to drink an ORS, finish the ORS first and then start slowly drinking other clear fluids. Drink fluids such as: Water. Do not drink only  water. Doing that can lead to hyponatremia, which is having too little salt (sodium) in the body. Water from ice chips you suck on. Fruit juice that you have added water to (diluted fruit juice). Low-calorie sports drinks. Eat foods that contain a healthy balance of electrolytes, such as bananas, oranges, potatoes, tomatoes, and spinach. Do not drink alcohol. Avoid the following: Drinks that contain a lot of sugar. These include high-calorie sports drinks, fruit juice that is not diluted, and soda. Caffeine. Foods that are greasy or contain a lot of fat or sugar. General instructions Take over-the-counter and prescription medicines only as told by your health care provider. Do not take sodium tablets. Doing that can lead to having too much sodium in the body (hypernatremia). Return to your normal activities as told by your health care provider. Ask your health care provider what activities are safe for you. Keep all follow-up visits as told by your health care provider. This is important. Contact a health care provider if: You have muscle cramps, pain, or discomfort, such as: Pain in your abdomen and the pain gets worse or stays in one area (localizes). Stiff neck. You have a rash. You are more irritable than usual. You are sleepier or have a harder time waking than usual. You feel weak or dizzy. You feel very thirsty. Get help right away if you have: Any symptoms of severe dehydration. Symptoms of vomiting, such as: You cannot eat or drink without vomiting. Vomiting gets worse or does not go away. Vomit includes blood or green matter (bile). Symptoms that get worse with treatment. A fever. A severe headache. Problems with urination or bowel movements, such as: Diarrhea that gets worse or does not go away. Blood in your stool (feces). This may cause stool to look black and tarry. Not urinating, or urinating only a small amount of very dark urine, within 6-8 hours. Trouble  breathing. These symptoms may represent a serious problem that is an emergency. Do not wait to see if the symptoms will go away. Get medical help right away. Call your local emergency services (911 in the [place] Do not drive yourself to the hospital. Summary Dehydration is a condition in which there is not enough water or other fluids in the body. This happens when a person loses more fluids than he or she takes in. Treatment for this condition depends on how severe it is. Treatment should be started right away. Do not wait until dehydration becomes severe. Drink enough clear fluid to keep your urine pale yellow. If you were told to drink an oral rehydration solution (ORS), finish the ORS first and then start slowly drinking other clear fluids. Take over-the-counter and prescription medicines only as told by your health care provider. Get help right away if you have any symptoms of severe dehydration. This information is not intended to replace advice given to you by your health care provider. Make sure you discuss any questions you have with your health care provider. Document Revised: 08/09/2021 Document Reviewed: 11/13/2018 Elsevier Patient Education  2023 [employer] Inc[employer]  

## 2021-12-29 NOTE — Progress Notes (Signed)
[place] OFFICE PROGRESS NOTE   Diagnosis: Prostate cancer  INTERVAL HISTORY:   Edgar Mooney returns prior to scheduled follow-up.  Olaparib was discontinued following last office visit due to rising PSA.  He was referred for a PET PSMA scan to see if he is a candidate for Pluvicto.  The scan has not been completed due to insurance company denial.  He reports feeling weak and tired.  No nausea or vomiting.  No diarrhea.  Poor appetite.  He endorses poor fluid intake.  He is intermittently lightheaded with position change.  He notes his mouth is dry.  He continues to have significant back pain.  No leg weakness or numbness.  No bowel or bladder dysfunction.  He is currently taking oxycodone 5 mg 2 tablets every 6 hours.  Objective:  Vital signs in last 24 hours:  Blood pressure (!) 104/52, pulse 76, temperature 98.1 F (36.7 C), temperature source Tympanic, resp. rate 18, weight 143 lb 3.2 oz (65 kg), SpO2 100 %.    HEENT: No thrush or ulcers. Resp: Lungs clear bilaterally. Cardio: Regular rate and rhythm. GI: Abdomen soft and nontender.  No hepatomegaly. Vascular: No leg edema. Neuro: Alert and oriented.  Motor strength 5/5.  Knee DTRs 1+, symmetric. Skin: Decreased skin turgor.   Lab Results:  Lab Results  Component Value Date   WBC 3.6 (L) 12/29/2021   HGB 8.9 (L) 12/29/2021   HCT 26.4 (L) 12/29/2021   MCV 91.0 12/29/2021   PLT 127 (L) 12/29/2021   NEUTROABS 3.0 12/29/2021    Imaging:  No results found.  Medications: I have reviewed the patient's current medications.  Assessment/Plan: Metastatic prostate cancer - Lytic bone lesions with retroperitoneal adenopathy concerning for metastatic prostate cancer -03/03/2020-CTA chest/abdomen/pelvis widespread osseous metastatic disease and lower retroperitoneal adenopathy, pathologic right anterior third rib fracture, probable spinal canal tumor at the level of S1, -MRI cervical, thoracic, and lumbar  spine 03/03/2020-diffuse osseous metastatic disease, ventral epidural tumor impinging on right S1 nerve root, extraosseous tumor at the bilateral ilium, asymmetric enhancing material at the right C5-6 canal-right  -03/03/2020-PSA 763 -Degarelix 03/04/2020 -Abiraterone/prednisone 03/14/2020 -Every 50-month Lupron 04/01/2020 -04/01/2020 PSA 36.5 -05/31/2020 PSA 1.2 -06/28/2020 PSA 1.0 -12/14/2020 PSA 6.4 -01/13/2021 PSA 10.6 -04/26/2021 PSA  45.2 -05/16/2021 PSA 70 -Abiraterone/prednisone discontinued 05/16/2021 -05/16/2021 left hip x-ray-multifocal sclerotic metastasis throughout the pelvis, confluent involving the left acetabulum.  No acute or pathologic fracture.  Left hip osteoarthritis. -Cycle 1 docetaxel 05/31/2021 -05/31/2021 PSA 113 -Palliative radiation to the left acetabulum 06/08/2021 - 06/20/2021 -Cycle 2 docetaxel 06/21/2021 -06/21/2021 PSA improved at 68 -Cycle 3 docetaxel 07/12/2021 -07/12/2021 PSA improved at 52 -Cycle 4 docetaxel 08/02/2021 -Cycle 5 docetaxel 08/22/2021 -CT chest 09/16/2021-negative for pulmonary embolism, right liver mass, extensive bone metastases -Cycle 6 docetaxel 09/28/2021 -PSA increased 09/28/2021 -Olaparib 10/24/2021, patient decreased dose to 1 tablet twice daily on 10/31/2021 due to muscle spasms; dose resumed at prescribed amount 11/06/2021 -PSA increased 11/24/2021 -PSA further increased 12/08/2021 -Olaparib discontinued   2.  Severe anemia-likely secondary to metastatic prostate cancer involving the bones 3.  Mild thrombocytopenia 4.  Cirrhosis with splenomegaly 5.  History of hepatitis C 6.  History of polysubstance abuse 7.  Depression 8.  Tobacco dependence 9.  Right arm weakness, right facial numbness-potentially related to nerve root compromise from metastatic bone lesions 10.  Pain secondary to #1 11.  Extensive bone metastases-every 30-month Zometa starting 04/01/2020 12.  Gout-acute flare right wrist 04/01/2020 treated with indomethacin, allopurinol  resumed 13.  Right hepatic lobe mass with elevated AFP concerning for [facility] MRI abdomen 12/21/2020-hypoenhancing inferior right liver mass, segment 6, occluding or compressing the adjacent right portal vein branch, intrahepatic cholangiocarcinoma favored with differential including hepatocellular carcinoma, Li-Rads M Y 90 planning study 11-22 Y90 right lobe of liver 06/26/2021 14.  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  Presentation to the [facility] emergency room 09/16/2021 with chest pain-transfer to [facility] on a morphine drip and scheduled Ativan    Disposition: Edgar Mooney has metastatic prostate cancer.  Most recently he was treated with olaparib with increase in the PSA.  Olaparib has been discontinued.  He was referred for a PSMA PET scan to see if he is a candidate for Pluvicto.  Unfortunately this was denied by his insurance company.  We have requested reconsideration but have not heard back yet.  For pain he will increase the oxycodone to 15 mg every 6 hours as needed.  He may be dehydrated.  He will receive a liter of normal saline in the office today.  We are also making arrangements for transfusion of 1 unit of blood early next week.  He will return for follow-up as scheduled 01/01/2022.  He understands to seek emergency evaluation for extremity weakness/numbness, bowel/bladder dysfunction.    Ned Card ANP/GNP-BC   12/29/2021  1:15 PM

## 2022-01-01 ENCOUNTER — Other Ambulatory Visit (HOSPITAL_BASED_OUTPATIENT_CLINIC_OR_DEPARTMENT_OTHER): Payer: Self-pay

## 2022-01-01 ENCOUNTER — Inpatient Hospital Stay: Payer: BC Managed Care – PPO

## 2022-01-01 ENCOUNTER — Inpatient Hospital Stay (HOSPITAL_BASED_OUTPATIENT_CLINIC_OR_DEPARTMENT_OTHER): Payer: BC Managed Care – PPO | Admitting: Nurse Practitioner

## 2022-01-01 ENCOUNTER — Other Ambulatory Visit (HOSPITAL_COMMUNITY): Payer: Self-pay

## 2022-01-01 VITALS — BP 135/69 | HR 74 | Resp 18

## 2022-01-01 DIAGNOSIS — M109 Gout, unspecified: Secondary | ICD-10-CM | POA: Diagnosis not present

## 2022-01-01 DIAGNOSIS — C61 Malignant neoplasm of prostate: Secondary | ICD-10-CM

## 2022-01-01 DIAGNOSIS — M1612 Unilateral primary osteoarthritis, left hip: Secondary | ICD-10-CM | POA: Diagnosis not present

## 2022-01-01 DIAGNOSIS — F32A Depression, unspecified: Secondary | ICD-10-CM | POA: Diagnosis not present

## 2022-01-01 DIAGNOSIS — K7460 Unspecified cirrhosis of liver: Secondary | ICD-10-CM | POA: Diagnosis not present

## 2022-01-01 DIAGNOSIS — I252 Old myocardial infarction: Secondary | ICD-10-CM | POA: Diagnosis not present

## 2022-01-01 DIAGNOSIS — F17200 Nicotine dependence, unspecified, uncomplicated: Secondary | ICD-10-CM | POA: Diagnosis not present

## 2022-01-01 DIAGNOSIS — D696 Thrombocytopenia, unspecified: Secondary | ICD-10-CM | POA: Diagnosis not present

## 2022-01-01 DIAGNOSIS — R162 Hepatomegaly with splenomegaly, not elsewhere classified: Secondary | ICD-10-CM | POA: Diagnosis not present

## 2022-01-01 DIAGNOSIS — C7951 Secondary malignant neoplasm of bone: Secondary | ICD-10-CM | POA: Diagnosis not present

## 2022-01-01 DIAGNOSIS — Z8619 Personal history of other infectious and parasitic diseases: Secondary | ICD-10-CM | POA: Diagnosis not present

## 2022-01-01 DIAGNOSIS — D649 Anemia, unspecified: Secondary | ICD-10-CM | POA: Diagnosis not present

## 2022-01-01 LAB — CMP (CANCER CENTER ONLY)
ALT: 10 U/L (ref 0–44)
AST: 14 U/L — ABNORMAL LOW (ref 15–41)
Albumin: 4 g/dL (ref 3.5–5.0)
Alkaline Phosphatase: 215 U/L — ABNORMAL HIGH (ref 38–126)
Anion gap: 13 (ref 5–15)
BUN: 12 mg/dL (ref 6–20)
CO2: 19 mmol/L — ABNORMAL LOW (ref 22–32)
Calcium: 9.2 mg/dL (ref 8.9–10.3)
Chloride: 105 mmol/L (ref 98–111)
Creatinine: 0.66 mg/dL (ref 0.61–1.24)
GFR, Estimated: >60 mL/min (ref >60)
Glucose, Bld: 116 mg/dL — ABNORMAL HIGH (ref 70–99)
Potassium: 3.5 mmol/L (ref 3.5–5.1)
Sodium: 137 mmol/L (ref 135–145)
Total Bilirubin: 0.6 mg/dL (ref 0.3–1.2)
Total Protein: 7.4 g/dL (ref 6.5–8.1)

## 2022-01-01 LAB — CBC WITH DIFFERENTIAL (CANCER CENTER ONLY)
Abs Immature Granulocytes: 0.04 10*3/uL (ref 0.00–0.07)
Basophils Absolute: 0 10*3/uL (ref 0.0–0.1)
Basophils Relative: 1 %
Eosinophils Absolute: 0 10*3/uL (ref 0.0–0.5)
Eosinophils Relative: 1 %
HCT: 29.4 % — ABNORMAL LOW (ref 39.0–52.0)
Hemoglobin: 9.7 g/dL — ABNORMAL LOW (ref 13.0–17.0)
Immature Granulocytes: 1 %
Lymphocytes Relative: 7 %
Lymphs Abs: 0.3 10*3/uL — ABNORMAL LOW (ref 0.7–4.0)
MCH: 31.1 pg (ref 26.0–34.0)
MCHC: 33 g/dL (ref 30.0–36.0)
MCV: 94.2 fL (ref 80.0–100.0)
Monocytes Absolute: 0.2 10*3/uL (ref 0.1–1.0)
Monocytes Relative: 4 %
Neutro Abs: 3.5 10*3/uL (ref 1.7–7.7)
Neutrophils Relative %: 86 %
Platelet Count: 151 10*3/uL (ref 150–400)
RBC: 3.12 MIL/uL — ABNORMAL LOW (ref 4.22–5.81)
RDW: 19.2 % — ABNORMAL HIGH (ref 11.5–15.5)
WBC Count: 4 10*3/uL (ref 4.0–10.5)
nRBC: 0 % (ref 0.0–0.2)

## 2022-01-01 MED ORDER — OXYCODONE HCL 5 MG PO TABS
10.0000 mg | ORAL_TABLET | Freq: Once | ORAL | Status: AC
  Administered 2022-01-01: 10 mg via ORAL
  Filled 2022-01-01: qty 2

## 2022-01-01 MED ORDER — SODIUM CHLORIDE 0.9 % IV SOLN: INTRAVENOUS | Status: DC

## 2022-01-01 MED ORDER — OXYCODONE HCL 5 MG PO TABS
5.0000 mg | ORAL_TABLET | Freq: Four times a day (QID) | ORAL | 0 refills | Status: DC | PRN
  Filled 2022-01-01: qty 100, 9d supply, fill #0

## 2022-01-01 NOTE — Patient Instructions (Signed)
Rehydration, Adult Rehydration is the replacement of body fluids, salts, and minerals (electrolytes) that are lost during dehydration. Dehydration is when there is not enough water or other fluids in the body. This happens when you lose more fluids than you take in. Common causes of dehydration include: Not drinking enough fluids. This can occur when you are ill or doing activities that require a lot of energy, especially in hot weather. Conditions that cause loss of water or other fluids, such as diarrhea, vomiting, sweating, or urinating a lot. Other illnesses, such as fever or infection. Certain medicines, such as those that remove excess fluid from the body (diuretics). Symptoms of mild or moderate dehydration may include thirst, dry lips and mouth, and dizziness. Symptoms of severe dehydration may include increased heart rate, confusion, fainting, and not urinating. For severe dehydration, you may need to get fluids through an IV at the hospital. For mild or moderate dehydration, you can usually rehydrate at home by drinking certain fluids as told by your health care provider. What are the risks? Generally, rehydration is safe. However, taking in too much fluid (overhydration) can be a problem. This is rare. Overhydration can cause an electrolyte imbalance, kidney failure, or a decrease in salt (sodium) levels in the body. Supplies needed You will need an oral rehydration solution (ORS) if your health care provider tells you to use one. This is a drink to treat dehydration. It can be found in pharmacies and retail stores. How to rehydrate Fluids Follow instructions from your health care provider for rehydration. The kind of fluid and the amount you should drink depend on your condition. In general, you should choose drinks that you prefer. If told by your health care provider, drink an ORS. Make an ORS by following instructions on the package. Start by drinking small amounts, about  cup (120  mL) every 5-10 minutes. Slowly increase how much you drink until you have taken the amount recommended by your health care provider. Drink enough clear fluids to keep your urine pale yellow. If you were told to drink an ORS, finish it first, then start slowly drinking other clear fluids. Drink fluids such as: Water. This includes sparkling water and flavored water. Drinking only water can lead to having too little sodium in your body (hyponatremia). Follow the advice of your health care provider. Water from ice chips you suck on. Fruit juice with water you add to it (diluted). Sports drinks. Hot or cold herbal teas. Broth-based soups. Milk or milk products. Food Follow instructions from your health care provider about what to eat while you rehydrate. Your health care provider may recommend that you slowly begin eating regular foods in small amounts. Eat foods that contain a healthy balance of electrolytes, such as bananas, oranges, potatoes, tomatoes, and spinach. Avoid foods that are greasy or contain a lot of sugar. In some cases, you may get nutrition through a feeding tube that is passed through your nose and into your stomach (nasogastric tube, or NG tube). This may be done if you have uncontrolled vomiting or diarrhea. Beverages to avoid  Certain beverages may make dehydration worse. While you rehydrate, avoid drinking alcohol. How to tell if you are recovering from dehydration You may be recovering from dehydration if: You are urinating more often than before you started rehydrating. Your urine is pale yellow. Your energy level improves. You vomit less frequently. You have diarrhea less frequently. Your appetite improves or returns to normal. You feel less dizzy or less light-headed.   Your skin tone and color start to look more normal. Follow these instructions at home: Take over-the-counter and prescription medicines only as told by your health care provider. Do not take sodium  tablets. Doing this can lead to having too much sodium in your body (hypernatremia). Contact a health care provider if: You continue to have symptoms of mild or moderate dehydration, such as: Thirst. Dry lips. Slightly dry mouth. Dizziness. Dark urine or less urine than normal. Muscle cramps. You continue to vomit or have diarrhea. Get help right away if you: Have symptoms of dehydration that get worse. Have a fever. Have a severe headache. Have been vomiting and the following happens: Your vomiting gets worse or does not go away. Your vomit includes blood or green matter (bile). You cannot eat or drink without vomiting. Have problems with urination or bowel movements, such as: Diarrhea that gets worse or does not go away. Blood in your stool (feces). This may cause stool to look black and tarry. Not urinating, or urinating only a small amount of very dark urine, within 6-8 hours. Have trouble breathing. Have symptoms that get worse with treatment. These symptoms may represent a serious problem that is an emergency. Do not wait to see if the symptoms will go away. Get medical help right away. Call your local emergency services (911 in the [place] Do not drive yourself to the hospital. Summary Rehydration is the replacement of body fluids and minerals (electrolytes) that are lost during dehydration. Follow instructions from your health care provider for rehydration. The kind of fluid and amount you should drink depend on your condition. Slowly increase how much you drink until you have taken the amount recommended by your health care provider. Contact your health care provider if you continue to show signs of mild or moderate dehydration. This information is not intended to replace advice given to you by your health care provider. Make sure you discuss any questions you have with your health care provider. Document Revised: 06/03/2019 Document Reviewed: 04/13/2019 Elsevier Patient  Education  2023 [employer] Inc.  

## 2022-01-01 NOTE — Progress Notes (Signed)
Blood transfusion cancelled per Leander Rams, NP. Hgb 9.7 today.  Orders for IVF's. [facility] called to return blood.

## 2022-01-01 NOTE — Progress Notes (Signed)
Pt complaining of back pain, rated at 18, Leander Rams, NP notified.  Pt also requesting refill for oxycodone, Leander Rams, NP notified.

## 2022-01-01 NOTE — Progress Notes (Signed)
[place] OFFICE PROGRESS NOTE   Diagnosis: Prostate cancer  INTERVAL HISTORY:   Edgar Mooney returns as scheduled.  He was seen in an unscheduled office visit 12/29/2021.  He was concerned his hemoglobin was low.  He appeared dehydrated.  He received a liter of IV fluids and was scheduled for transfusion of 1 unit of blood today.  Repeat hemoglobin today returned at 9.7.  He reports good oral intake over the weekend.  Pain continues to be his main complaint.  The most significant pain is located at the low back.  No leg weakness or numbness.  No bowel or bladder dysfunction.  He is taking oxycodone 15 mg every 6 hours.  Objective:  Vital signs in last 24 hours:  Heart rate 74, respirations 18, blood pressure, oxygen saturation 100%    HEENT: Mild white coating over tongue. GI: Abdomen soft and nontender. Vascular: No leg edema. Neuro: Alert and oriented.    Lab Results:  Lab Results  Component Value Date   WBC 4.0 01/01/2022   HGB 9.7 (L) 01/01/2022   HCT 29.4 (L) 01/01/2022   MCV 94.2 01/01/2022   PLT 151 01/01/2022   NEUTROABS 3.5 01/01/2022    Imaging:  No results found.  Medications: I have reviewed the patient's current medications.  Assessment/Plan: Metastatic prostate cancer - Lytic bone lesions with retroperitoneal adenopathy concerning for metastatic prostate cancer -03/03/2020-CTA chest/abdomen/pelvis widespread osseous metastatic disease and lower retroperitoneal adenopathy, pathologic right anterior third rib fracture, probable spinal canal tumor at the level of S1, -MRI cervical, thoracic, and lumbar spine 03/03/2020-diffuse osseous metastatic disease, ventral epidural tumor impinging on right S1 nerve root, extraosseous tumor at the bilateral ilium, asymmetric enhancing material at the right C5-6 canal-right  -03/03/2020-PSA 763 -Degarelix 03/04/2020 -Abiraterone/prednisone 03/14/2020 -Every 47-monthLupron 04/01/2020 -04/01/2020 PSA  36.5 -05/31/2020 PSA 1.2 -06/28/2020 PSA 1.0 -12/14/2020 PSA 6.4 -01/13/2021 PSA 10.6 -04/26/2021 PSA  45.2 -05/16/2021 PSA 70 -Abiraterone/prednisone discontinued 05/16/2021 -05/16/2021 left hip x-ray-multifocal sclerotic metastasis throughout the pelvis, confluent involving the left acetabulum.  No acute or pathologic fracture.  Left hip osteoarthritis. -Cycle 1 docetaxel 05/31/2021 -05/31/2021 PSA 113 -Palliative radiation to the left acetabulum 06/08/2021 - 06/20/2021 -Cycle 2 docetaxel 06/21/2021 -06/21/2021 PSA improved at 68 -Cycle 3 docetaxel 07/12/2021 -07/12/2021 PSA improved at 52 -Cycle 4 docetaxel 08/02/2021 -Cycle 5 docetaxel 08/22/2021 -CT chest 09/16/2021-negative for pulmonary embolism, right liver mass, extensive bone metastases -Cycle 6 docetaxel 09/28/2021 -PSA increased 09/28/2021 -Olaparib 10/24/2021, patient decreased dose to 1 tablet twice daily on 10/31/2021 due to muscle spasms; dose resumed at prescribed amount 11/06/2021 -PSA increased 11/24/2021 -PSA further increased 12/08/2021 -Olaparib discontinued   2.  Severe anemia-likely secondary to metastatic prostate cancer involving the bones 3.  Mild thrombocytopenia 4.  Cirrhosis with splenomegaly 5.  History of hepatitis C 6.  History of polysubstance abuse 7.  Depression 8.  Tobacco dependence 9.  Right arm weakness, right facial numbness-potentially related to nerve root compromise from metastatic bone lesions 10.  Pain secondary to #1 11.  Extensive bone metastases-every 324-monthometa starting 04/01/2020 12.  Gout-acute flare right wrist 04/01/2020 treated with indomethacin, allopurinol resumed 13.  Right hepatic lobe mass with elevated AFP [facility] 12/[facility]-hypoenhancing inferior right liver mass, segment 6, occluding or compressing the adjacent right portal vein branch, intrahepatic cholangiocarcinoma favored with differential including hepatocellular carcinoma, Li-Rads M Y 90 planning study 11-22 Y90  right lobe of liver 06/26/2021 14.  Admission 12/19/2020 with an NSTEMI Catheterization 12/21/2020-severe multivessel CAD,  99% proximal LAD stenosis, LAD stent placed 15. 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  Presentation to the [facility] emergency room 09/16/2021 with chest pain-transfer to [facility] on a morphine drip and scheduled Ativan    Disposition: Edgar Mooney appears unchanged.  He has hormone refractory metastatic prostate cancer most recently treated with olaparib, discontinued due to rising PSA.  PSMA PET scan has been approved.  He is scheduled for the scan 01/11/2022.  We will see him in follow-up on 01/15/2022 to review results.  Patient seen with Dr. Benay Spice.  Ned Card ANP/GNP-BC   01/01/2022  12:55 PM This was a shared visit with Ned Card.  Edgar Mooney has progressive metastatic prostate cancer despite hormonal therapy, docetaxel, and a PARP inhibitor.  He has been referred for a PSMA PET.  If the prostate cancer takes up the PSMA agent he will be referred for therapeutic radioligand therapy.  I was present for greater than 50% of today's visit.  I performed medical decision making.  Julieanne Manson, MD

## 2022-01-02 LAB — BPAM RBC
Blood Product Expiration Date: 202310082359
ISSUE DATE / TIME: 202309181144
Unit Type and Rh: 6200

## 2022-01-02 LAB — PROSTATE-SPECIFIC AG, SERUM (LABCORP): Prostate Specific Ag, Serum: 1078 ng/mL — ABNORMAL HIGH (ref 0.0–4.0)

## 2022-01-02 LAB — TYPE AND SCREEN
ABO/RH(D): A POS
Antibody Screen: NEGATIVE
Unit division: 0

## 2022-01-03 ENCOUNTER — Other Ambulatory Visit (HOSPITAL_COMMUNITY): Payer: Self-pay

## 2022-01-03 ENCOUNTER — Encounter (HOSPITAL_COMMUNITY): Payer: BC Managed Care – PPO

## 2022-01-04 ENCOUNTER — Telehealth: Payer: Self-pay | Admitting: *Deleted

## 2022-01-04 NOTE — Telephone Encounter (Signed)
Edgar Mooney called to inquire when his future appointments are scheduled. Informed him of PET on 9/28 and prep and OV on 10/2. He will call transportation service for his ride.

## 2022-01-08 ENCOUNTER — Other Ambulatory Visit (HOSPITAL_COMMUNITY): Payer: Self-pay

## 2022-01-09 ENCOUNTER — Other Ambulatory Visit (HOSPITAL_BASED_OUTPATIENT_CLINIC_OR_DEPARTMENT_OTHER): Payer: Self-pay

## 2022-01-11 ENCOUNTER — Telehealth: Payer: Self-pay | Admitting: *Deleted

## 2022-01-11 ENCOUNTER — Other Ambulatory Visit (HOSPITAL_BASED_OUTPATIENT_CLINIC_OR_DEPARTMENT_OTHER): Payer: Self-pay

## 2022-01-11 ENCOUNTER — Other Ambulatory Visit: Payer: Self-pay | Admitting: Nurse Practitioner

## 2022-01-11 ENCOUNTER — Encounter (HOSPITAL_COMMUNITY)
Admission: RE | Admit: 2022-01-11 | Discharge: 2022-01-11 | Disposition: A | Discharge status: Home / Self Care | Payer: BC Managed Care – PPO | Source: Ambulatory Visit | Attending: Nurse Practitioner | Admitting: Nurse Practitioner

## 2022-01-11 DIAGNOSIS — C61 Malignant neoplasm of prostate: Secondary | ICD-10-CM | POA: Diagnosis not present

## 2022-01-11 DIAGNOSIS — C7951 Secondary malignant neoplasm of bone: Secondary | ICD-10-CM | POA: Diagnosis not present

## 2022-01-11 MED ORDER — PIFLIFOLASTAT F 18 (PYLARIFY) INJECTION
9.0000 | Freq: Once | INTRAVENOUS | Status: AC
  Administered 2022-01-11: 8.61 via INTRAVENOUS

## 2022-01-11 MED ORDER — OXYCODONE HCL 5 MG PO TABS
5.0000 mg | ORAL_TABLET | Freq: Four times a day (QID) | ORAL | 0 refills | Status: DC | PRN
  Filled 2022-01-11: qty 100, 9d supply, fill #0

## 2022-01-11 NOTE — Telephone Encounter (Signed)
Patient left VM requesting refill on oxycodone.

## 2022-01-15 ENCOUNTER — Inpatient Hospital Stay: Payer: BC Managed Care – PPO | Attending: Nurse Practitioner | Admitting: Nurse Practitioner

## 2022-01-15 ENCOUNTER — Encounter: Payer: Self-pay | Admitting: Nurse Practitioner

## 2022-01-15 VITALS — BP 124/65 | HR 88 | Temp 98.1°F | Resp 20 | Ht 62.0 in | Wt 142.6 lb

## 2022-01-15 DIAGNOSIS — R162 Hepatomegaly with splenomegaly, not elsewhere classified: Secondary | ICD-10-CM | POA: Diagnosis not present

## 2022-01-15 DIAGNOSIS — Z8619 Personal history of other infectious and parasitic diseases: Secondary | ICD-10-CM | POA: Insufficient documentation

## 2022-01-15 DIAGNOSIS — B370 Candidal stomatitis: Secondary | ICD-10-CM | POA: Diagnosis not present

## 2022-01-15 DIAGNOSIS — I252 Old myocardial infarction: Secondary | ICD-10-CM | POA: Insufficient documentation

## 2022-01-15 DIAGNOSIS — M1612 Unilateral primary osteoarthritis, left hip: Secondary | ICD-10-CM | POA: Diagnosis not present

## 2022-01-15 DIAGNOSIS — F32A Depression, unspecified: Secondary | ICD-10-CM | POA: Diagnosis not present

## 2022-01-15 DIAGNOSIS — R772 Abnormality of alphafetoprotein: Secondary | ICD-10-CM | POA: Diagnosis not present

## 2022-01-15 DIAGNOSIS — Z5111 Encounter for antineoplastic chemotherapy: Secondary | ICD-10-CM | POA: Diagnosis not present

## 2022-01-15 DIAGNOSIS — C7951 Secondary malignant neoplasm of bone: Secondary | ICD-10-CM | POA: Insufficient documentation

## 2022-01-15 DIAGNOSIS — F17200 Nicotine dependence, unspecified, uncomplicated: Secondary | ICD-10-CM | POA: Diagnosis not present

## 2022-01-15 DIAGNOSIS — D649 Anemia, unspecified: Secondary | ICD-10-CM | POA: Diagnosis not present

## 2022-01-15 DIAGNOSIS — F1911 Other psychoactive substance abuse, in remission: Secondary | ICD-10-CM | POA: Insufficient documentation

## 2022-01-15 DIAGNOSIS — M109 Gout, unspecified: Secondary | ICD-10-CM | POA: Insufficient documentation

## 2022-01-15 DIAGNOSIS — C61 Malignant neoplasm of prostate: Secondary | ICD-10-CM | POA: Insufficient documentation

## 2022-01-15 DIAGNOSIS — C220 Liver cell carcinoma: Secondary | ICD-10-CM | POA: Insufficient documentation

## 2022-01-15 DIAGNOSIS — R1310 Dysphagia, unspecified: Secondary | ICD-10-CM | POA: Insufficient documentation

## 2022-01-15 DIAGNOSIS — K7460 Unspecified cirrhosis of liver: Secondary | ICD-10-CM | POA: Diagnosis not present

## 2022-01-15 DIAGNOSIS — D696 Thrombocytopenia, unspecified: Secondary | ICD-10-CM | POA: Diagnosis not present

## 2022-01-15 NOTE — Progress Notes (Signed)
[place] OFFICE PROGRESS NOTE   Diagnosis: Prostate cancer  INTERVAL HISTORY:   Mr. Cavins returns for follow-up.  He complains of increased pain at the right hip.  He continues to have back pain.  He is taking oxycodone frequently.  Objective:  Vital signs in last 24 hours:  Blood pressure 124/65, pulse 88, temperature 98.1 F (36.7 C), resp. rate 20, height 5' 2" (1.575 m), weight 142 lb 9.6 oz (64.7 kg), SpO2 100 %.    HEENT: No thrush or ulcers. Resp: Lungs clear bilaterally. Cardio: Regular rate and rhythm. GI: Abdomen soft and nontender.  No hepatosplenomegaly. Vascular: No leg edema.   Lab Results:  Lab Results  Component Value Date   WBC 4.0 01/01/2022   HGB 9.7 (L) 01/01/2022   HCT 29.4 (L) 01/01/2022   MCV 94.2 01/01/2022   PLT 151 01/01/2022   NEUTROABS 3.5 01/01/2022    Imaging:  No results found.  Medications: I have reviewed the patient's current medications.  Assessment/Plan: Metastatic prostate cancer - Lytic bone lesions with retroperitoneal adenopathy concerning for metastatic prostate cancer -03/03/2020-CTA chest/abdomen/pelvis widespread osseous metastatic disease and lower retroperitoneal adenopathy, pathologic right anterior third rib fracture, probable spinal canal tumor at the level of S1, -MRI cervical, thoracic, and lumbar spine 03/03/2020-diffuse osseous metastatic disease, ventral epidural tumor impinging on right S1 nerve root, extraosseous tumor at the bilateral ilium, asymmetric enhancing material at the right C5-6 canal-right  -03/03/2020-PSA 763 -Degarelix 03/04/2020 -Abiraterone/prednisone 03/14/2020 -Every 38-monthLupron 04/01/2020 -04/01/2020 PSA 36.5 -05/31/2020 PSA 1.2 -06/28/2020 PSA 1.0 -12/14/2020 PSA 6.4 -01/13/2021 PSA 10.6 -04/26/2021 PSA  45.2 -05/16/2021 PSA 70 -Abiraterone/prednisone discontinued 05/16/2021 -05/16/2021 left hip x-ray-multifocal sclerotic metastasis throughout the pelvis, confluent  involving the left acetabulum.  No acute or pathologic fracture.  Left hip osteoarthritis. -Cycle 1 docetaxel 05/31/2021 -05/31/2021 PSA 113 -Palliative radiation to the left acetabulum 06/08/2021 - 06/20/2021 -Cycle 2 docetaxel 06/21/2021 -06/21/2021 PSA improved at 68 -Cycle 3 docetaxel 07/12/2021 -07/12/2021 PSA improved at 52 -Cycle 4 docetaxel 08/02/2021 -Cycle 5 docetaxel 08/22/2021 -CT chest 09/16/2021-negative for pulmonary embolism, right liver mass, extensive bone metastases -Cycle 6 docetaxel 09/28/2021 -PSA increased 09/28/2021 -Olaparib 10/24/2021, patient decreased dose to 1 tablet twice daily on 10/31/2021 due to muscle spasms; dose resumed at prescribed amount 11/06/2021 -PSA increased 11/24/2021 -PSA further increased 12/08/2021 -Olaparib discontinued -PET PSMA scan 01/11/2022-widespread intensely radiotracer avid sclerotic skeletal metastasis involving the axillary and appendicular skeleton.  Small-volume periaortic retroperitoneal radiotracer avid prostate cancer nodal metastasis.  Nonspecific activity in the prostate gland.  No liver or lung prostate cancer metastasis.   2.  Severe anemia-likely secondary to metastatic prostate cancer involving the bones 3.  Mild thrombocytopenia 4.  Cirrhosis with splenomegaly 5.  History of hepatitis C 6.  History of polysubstance abuse 7.  Depression 8.  Tobacco dependence 9.  Right arm weakness, right facial numbness-potentially related to nerve root compromise from metastatic bone lesions 10.  Pain secondary to #1 11.  Extensive bone metastases-every 366-monthometa starting 04/01/2020 12.  Gout-acute flare right wrist 04/01/2020 treated with indomethacin, allopurinol resumed 13.  Right hepatic lobe mass with elevated AFP concerning [facility] 12/21/2020-hypoenhancing inferior right liver mass, segment 6, occluding or compressing the adjacent right portal vein branch, intrahepatic cholangiocarcinoma favored with differential including  hepatocellular carcinoma, Li-Rads M Y 90 planning study 11-22 Y90 right lobe of liver 06/26/2021 14.  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  Presentation to the [facility] emergency room 09/16/2021 with chest pain-transfer to [facility] on a morphine drip and scheduled Ativan    Disposition: Mr. Geter appears unchanged.  He continues to have pain in multiple locations.  We reviewed the PET PSMA scan report and images with him at today's visit.  He understands the scan shows widespread radiotracer activity throughout the skeleton indicating he is a candidate for Pluvicto.  We made a referral for Pluvicto administration.  He will continue oxycodone as needed for pain control.  He will return for lab, follow-up, Zometa and Lupron the week of 02/05/2022.  We are available to see him sooner if needed.  Patient seen with Dr. Benay Spice.    Ned Card ANP/GNP-BC   01/15/2022  1:57 PM  This was a shared visit with Ned Card.  Mr. Woerner has clinical and radiologic progression of metastatic prostate cancer.  The tumor takes out the tracer on the PSMA scan.  He will be referred for therapeutic treatment with Pluvicto.  He will continue Lupron and Zometa.  We reviewed the PET findings and images with Mr. Upchurch today.  He will continue oxycodone as needed for pain.  We reviewed potential toxicities associated with Pluvicto.  He agrees to proceed.  I was present for greater than 50% of today's visit.  I performed medical decision making.  Julieanne Manson, MD

## 2022-01-16 ENCOUNTER — Encounter: Payer: Self-pay | Admitting: Oncology

## 2022-01-18 ENCOUNTER — Other Ambulatory Visit (HOSPITAL_BASED_OUTPATIENT_CLINIC_OR_DEPARTMENT_OTHER): Payer: Self-pay

## 2022-01-18 ENCOUNTER — Telehealth: Payer: Self-pay

## 2022-01-18 ENCOUNTER — Other Ambulatory Visit: Payer: Self-pay | Admitting: Nurse Practitioner

## 2022-01-18 DIAGNOSIS — C61 Malignant neoplasm of prostate: Secondary | ICD-10-CM

## 2022-01-18 MED ORDER — OXYCODONE HCL 5 MG PO TABS
5.0000 mg | ORAL_TABLET | Freq: Four times a day (QID) | ORAL | 0 refills | Status: DC | PRN
  Filled 2022-01-18: qty 100, 9d supply, fill #0

## 2022-01-18 NOTE — Telephone Encounter (Signed)
Patient called in and requested a refill of his Oxy IR, the request was place on the NP's desk

## 2022-01-19 ENCOUNTER — Other Ambulatory Visit (HOSPITAL_BASED_OUTPATIENT_CLINIC_OR_DEPARTMENT_OTHER): Payer: Self-pay

## 2022-01-22 ENCOUNTER — Encounter: Payer: Self-pay | Admitting: Oncology

## 2022-01-22 ENCOUNTER — Ambulatory Visit (HOSPITAL_BASED_OUTPATIENT_CLINIC_OR_DEPARTMENT_OTHER)
Admission: RE | Admit: 2022-01-22 | Discharge: 2022-01-22 | Disposition: A | Discharge status: Home / Self Care | Payer: BC Managed Care – PPO | Source: Ambulatory Visit | Attending: Oncology | Admitting: Oncology

## 2022-01-22 ENCOUNTER — Other Ambulatory Visit (HOSPITAL_BASED_OUTPATIENT_CLINIC_OR_DEPARTMENT_OTHER): Payer: Self-pay

## 2022-01-22 ENCOUNTER — Telehealth: Payer: Self-pay | Admitting: Radiation Oncology

## 2022-01-22 ENCOUNTER — Inpatient Hospital Stay (HOSPITAL_BASED_OUTPATIENT_CLINIC_OR_DEPARTMENT_OTHER): Payer: BC Managed Care – PPO | Admitting: Oncology

## 2022-01-22 ENCOUNTER — Telehealth: Payer: Self-pay

## 2022-01-22 VITALS — BP 120/83 | HR 100 | Temp 98.1°F | Resp 18 | Ht 62.0 in | Wt 141.6 lb

## 2022-01-22 DIAGNOSIS — C61 Malignant neoplasm of prostate: Secondary | ICD-10-CM

## 2022-01-22 DIAGNOSIS — R0602 Shortness of breath: Secondary | ICD-10-CM | POA: Diagnosis not present

## 2022-01-22 DIAGNOSIS — R5383 Other fatigue: Secondary | ICD-10-CM | POA: Diagnosis not present

## 2022-01-22 MED ORDER — MORPHINE SULFATE ER 15 MG PO TBCR
15.0000 mg | EXTENDED_RELEASE_TABLET | Freq: Two times a day (BID) | ORAL | 0 refills | Status: DC
  Filled 2022-01-22: qty 60, 30d supply, fill #0

## 2022-01-22 MED ORDER — OXYCODONE HCL 5 MG PO TABS
5.0000 mg | ORAL_TABLET | Freq: Four times a day (QID) | ORAL | 0 refills | Status: DC | PRN
  Filled 2022-01-22 – 2022-01-25 (×2): qty 100, 9d supply, fill #0

## 2022-01-22 MED ORDER — DEXAMETHASONE 4 MG PO TABS
4.0000 mg | ORAL_TABLET | Freq: Every day | ORAL | 0 refills | Status: DC
  Filled 2022-01-22: qty 30, 30d supply, fill #0

## 2022-01-22 MED ORDER — IOHEXOL 350 MG/ML SOLN
100.0000 mL | Freq: Once | INTRAVENOUS | Status: AC | PRN
  Administered 2022-01-22: 75 mL via INTRAVENOUS

## 2022-01-22 NOTE — Progress Notes (Signed)
[place] OFFICE PROGRESS NOTE   Diagnosis: Prostate cancer  INTERVAL HISTORY:   Mr. Desroches returns prior to scheduled visit.  He has not yet been scheduled for Pluvicto.  He complains of increased pain at the right "hip "and at the right low posterior chest wall.  The right chest wall pain is pleuritic.  He has mild dyspnea.  He reports a poor appetite.  Objective:  Vital signs in last 24 hours:  Blood pressure 120/83, pulse 100, temperature 98.1 F (36.7 C), temperature source Oral, resp. rate 18, height _0  (1.575 m), weight 141 lb 9.6 oz (64.2 kg), SpO2 100 %.    Resp: Decreased breath sounds with end inspiratory rub at the right lower posterior chest, no respiratory distress Cardio: Regular rate and rhythm GI: No hepatosplenomegaly Vascular: No leg edema Musculoskeletal: Pain with external rotation of the right hip.  No tenderness or mass at the right flank region.  2 skull-based lesions at the frontal scalp   Lab Results:  Lab Results  Component Value Date   WBC 4.0 01/01/2022   HGB 9.7 (L) 01/01/2022   HCT 29.4 (L) 01/01/2022   MCV 94.2 01/01/2022   PLT 151 01/01/2022   NEUTROABS 3.5 01/01/2022    CMP  Lab Results  Component Value Date   NA 137 01/01/2022   K 3.5 01/01/2022   CL 105 01/01/2022   CO2 19 (L) 01/01/2022   GLUCOSE 116 (H) 01/01/2022   BUN 12 01/01/2022   CREATININE 0.66 01/01/2022   CALCIUM 9.2 01/01/2022   PROT 7.4 01/01/2022   ALBUMIN 4.0 01/01/2022   AST 14 (L) 01/01/2022   ALT 10 01/01/2022   ALKPHOS 215 (H) 01/01/2022   BILITOT 0.6 01/01/2022   GFRNONAA >60 01/01/2022   GFRAA 121 12/19/2017     Medications: I have reviewed the patient's current medications.   Assessment/Plan: Metastatic prostate cancer - Lytic bone lesions with retroperitoneal adenopathy concerning for metastatic prostate cancer -03/03/2020-CTA chest/abdomen/pelvis widespread osseous metastatic disease and lower retroperitoneal adenopathy,  pathologic right anterior third rib fracture, probable spinal canal tumor at the level of S1, -MRI cervical, thoracic, and lumbar spine 03/03/2020-diffuse osseous metastatic disease, ventral epidural tumor impinging on right S1 nerve root, extraosseous tumor at the bilateral ilium, asymmetric enhancing material at the right C5-6 canal-right  -03/03/2020-PSA 763 -Degarelix 03/04/2020 -Abiraterone/prednisone 03/14/2020 -Every 69-monthLupron 04/01/2020 -04/01/2020 PSA 36.5 -05/31/2020 PSA 1.2 -06/28/2020 PSA 1.0 -12/14/2020 PSA 6.4 -01/13/2021 PSA 10.6 -04/26/2021 PSA  45.2 -05/16/2021 PSA 70 -Abiraterone/prednisone discontinued 05/16/2021 -05/16/2021 left hip x-ray-multifocal sclerotic metastasis throughout the pelvis, confluent involving the left acetabulum.  No acute or pathologic fracture.  Left hip osteoarthritis. -Cycle 1 docetaxel 05/31/2021 -05/31/2021 PSA 113 -Palliative radiation to the left acetabulum 06/08/2021 - 06/20/2021 -Cycle 2 docetaxel 06/21/2021 -06/21/2021 PSA improved at 68 -Cycle 3 docetaxel 07/12/2021 -07/12/2021 PSA improved at 52 -Cycle 4 docetaxel 08/02/2021 -Cycle 5 docetaxel 08/22/2021 -CT chest 09/16/2021-negative for pulmonary embolism, right liver mass, extensive bone metastases -Cycle 6 docetaxel 09/28/2021 -PSA increased 09/28/2021 -Olaparib 10/24/2021, patient decreased dose to 1 tablet twice daily on 10/31/2021 due to muscle spasms; dose resumed at prescribed amount 11/06/2021 -PSA increased 11/24/2021 -PSA further increased 12/08/2021 -Olaparib discontinued -PET PSMA scan 01/11/2022-widespread intensely radiotracer avid sclerotic skeletal metastasis involving the axillary and appendicular skeleton.  Small-volume periaortic retroperitoneal radiotracer avid prostate cancer nodal metastasis.  Nonspecific activity in the prostate gland.  No liver or lung prostate cancer metastasis.   2.  Severe anemia-likely secondary to metastatic prostate  cancer involving the bones 3.  Mild  thrombocytopenia 4.  Cirrhosis with splenomegaly 5.  History of hepatitis C 6.  History of polysubstance abuse 7.  Depression 8.  Tobacco dependence 9.  Right arm weakness, right facial numbness-potentially related to nerve root compromise from metastatic bone lesions 10.  Pain secondary to #1 11.  Extensive bone metastases-every 42-monthZometa starting 04/01/2020 12.  Gout-acute flare right wrist 04/01/2020 treated with indomethacin, allopurinol resumed 13.  Right hepatic lobe mass with elevated AFP concerning [facility] 12/21/2020-hypoenhancing inferior right liver mass, segment 6, occluding or compressing the adjacent right portal vein branch, intrahepatic cholangiocarcinoma favored with differential including hepatocellular carcinoma, Li-Rads M Y 90 planning study 11-22 Y90 right lobe of liver 06/26/2021 14.  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  [facility]     Disposition: Mr. MCallananhas metastatic hormone refractory prostate cancer.  He has PSMA avid disease.  The plan is to treat with Pluvicto as soon as possible.  He has increased pain at the right pelvis and right flank region.  He has dyspnea and pleuritic pain.  He will be referred for a chest CT to rule out a pulmonary embolism.  I will refer him to Dr. KSondra Cometo consider palliative radiation to the right pelvis.  The right flank pain could be related to a rib metastasis.  He will continue oxycodone.  I added MS Contin.  He will begin Decadron once daily.  Mr. MHoffertwill return as scheduled in 2 weeks.  He will call in the interim if the pain has not improved.  We will follow-up on the chest CT today.      GBetsy Coder  MD  01/22/2022  1:46 PM

## 2022-01-22 NOTE — Telephone Encounter (Signed)
Patient called in stated he's having problem with pain. He can not move out of the bed. The pain is 20 out of 10. He wants to come in discuss he's next step. He is at a point that he does not what to do. MD agree for the patient to come in. The ride is schedule for him to come.

## 2022-01-22 NOTE — Telephone Encounter (Signed)
LVM to schedule CON with Dr. Kinard 

## 2022-01-23 ENCOUNTER — Telehealth: Payer: Self-pay | Admitting: *Deleted

## 2022-01-23 ENCOUNTER — Telehealth: Payer: Self-pay | Admitting: Radiation Oncology

## 2022-01-23 NOTE — Telephone Encounter (Signed)
Spoke with pt and scheduled RECON and SIM appts. Pt agreed and advised he would call Christian to coordinate txp.

## 2022-01-23 NOTE — Telephone Encounter (Signed)
Mr. Fooks left voice mail asking for status of referral to Dr. Sondra Come and when he may be able to receive the Pluvicto therapy by IR.

## 2022-01-23 NOTE — Telephone Encounter (Signed)
RT appointments have been scheduled. Left VM for nuclear med to call w/update on when the Pluvicto is available. Patient is aware.

## 2022-01-23 NOTE — Progress Notes (Signed)
Radiation Oncology         [phone] ________________________________  Outpatient Re-Consultation  Name: Edgar Mooney MRN: [id number]  Date: 01/24/2022  DOB: Apr 07, 1966  CC:Pcp, No  Ladell Pier, MD   REFERRING PHYSICIAN: Ladell Pier, MD  DIAGNOSIS: There were no encounter diagnoses.  The encounter diagnosis was Prostate cancer ([place]).   Metastatic prostate cancer; widespread osseous metastatic disease involving nearly all of the axial and appendicular skeleton    Cancer Staging  Prostate cancer [facility]) Staging form: Prostate, AJCC 8th Edition - Clinical: Stage IVB (cTX, cM1b) - Signed by Ladell Pier, MD on 05/23/2021  Interval Since Last Radiation:  7 months and 4 days     Intent: Palliative  Radiation Treatment Dates: 06/08/2021 through 06/20/2021 Site Technique Total Dose (Gy) Dose per Fx (Gy) Completed Fx Beam Energies  Hip, Left: Pelvis_L IMRT 28/28 3.5 8/8 15X    HISTORY OF PRESENT ILLNESS::Edgar Mooney is a 56 y.o. male who is accompanied by ***. he is seen as a courtesy of Dr. Benay Spice for re-evaluation and an opinion concerning radiation therapy as part of management for right hip pain from metastatic prostate cancer.   I last met with the patient for follow-up this past April 2023. Since that time, the patient continued systemic treatment consisting of taxotere until evidence of progression was noted this past June/July (detailed below).    On 09/16/21, the patient presented to the ED with complaints of chest pain.  Cardiac valuation performed was negative. CT of the chest performed revealed no evidence of pulmonary embolism. However, imaging showed a liver mass and an increase in severity of extensive sclerotic lesions throughout the skeleton since imaging in November 2021. Following admission, the patient was seen by palliative care and placed on a morphine drip and atavan for comfort care. The patient was then transferred to [facility]. Dr.  Learta Codding met with the patient while he was at [facility] place to discuss his status. Dr. Learta Codding notes that the patient's family did not think he that he would like to be on comfort care, prompting discontinuation of morphine and ativan. Dr. Benay Spice again met with the patient on 09/21/21. During which time, the patient voiced/confirmed that he would like to continue on chemotherapy rather than comfort care. He was able to resume taxotere on 09/28/21 (6th cycle).   During a follow-up visit with Dr. Benay Spice on 10/20/21, the patient endorsed  increased "soreness "over the anterior chest and back. Given his increased pain and recent CT evidence of progression, taxotere was discontinued. Following discussion, the patient agreed to Dr. Bernette Redbird recommendation of a trial of olaparib. (He otherwise continued leuprolide and Zometa.  The patient was noted to tolerate olaparib relatively well  the following month, and continued to take oxycodone for his back pain. However, labs in September showed an increase in PSA and olaparib was discontinued. He was referred for a PSMA PET scan to see if he could be a candidate for Pluvicto, however this was denied by his insurance company.  During a follow-up visit with Dr. Gearldine Shown office on 12/29/21, the patient presented with complaints of feeling weak, tired, decreased appetite, intermittent lightheadedness, and ongoing significant back pain. For his pain, his oxycodone was increased to 15 mg q6 hours. He was also given fluids for dehydration.  Thankfully, his PSMA PET was eventually approved by insurance. PSMA PET on 01/11/22 showed profound widespread intensely radiotracer avid sclerotic skeletal metastasis involving the axial and appendicular skeleton, with nearly  every bone involved. PET also showed: evidence of small volume periaortic retroperitoneal radiotracer avid prostate cancer nodal metastasis, nonspecific activity in the prostate gland, and radiotracer avid  metastasis to the right seminal vesicle. Otherwise, PET showed no evidence of prostate cancer nodal metastasis in the pelvis, liver prostate cancer metastasis, or lung metastasis.   Given findings of widespread disease, the patient was deemed a candidate for Pluvicto.   During a follow-up visits with Dr. Benay Spice on 01/15/22, the patient endorsed increased pain to the right hip and right lower posterior chest wall. He also reported dyspnea and detailed his chest pain as pleuritic. Given so, Dr. Learta Codding ordered a chest CTA to rule out PE. For his right hip pain, Dr. Benay Spice recommended radiation which we will discuss in detail today.   CTA of the chest on 01/22/22 showed no evidence of PE but new left and right pleural effusions since imaging on 01/11/22. CT also showed :mild interstitial thickening within the lung bases, morphologic features of the liver compatible with cirrhosis, the known previously treated hepatocellular carcinoma within right lobe of liver, and known widespread sclerotic bone metastases.  Of note: the patient underwent cardiac catheterization with stent placement on 08/08/2021.    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   Prostate cancer metastatic to bone [facility])     PAST SURGICAL HISTORY: Past Surgical History:  Procedure Laterality Date   CORONARY STENT INTERVENTION N/A 12/21/2020   Procedure: CORONARY STENT INTERVENTION;  Surgeon: Troy Sine, MD;  Location: [place] CV LAB;  Service: Cardiovascular;  Laterality: N/A;   CORONARY STENT INTERVENTION N/A 12/22/2020   Procedure: CORONARY STENT INTERVENTION;  Surgeon: Burnell Blanks, MD;  Location: [place] CV LAB;  Service: Cardiovascular;   Laterality: N/A;   IR 3D INDEPENDENT WKST  02/15/2021   IR ANGIOGRAM SELECTIVE EACH ADDITIONAL VESSEL  02/15/2021   IR ANGIOGRAM SELECTIVE EACH ADDITIONAL VESSEL  02/15/2021   IR ANGIOGRAM SELECTIVE EACH ADDITIONAL VESSEL  06/26/2021   IR ANGIOGRAM SELECTIVE EACH ADDITIONAL VESSEL  06/28/2021   IR ANGIOGRAM VISCERAL SELECTIVE  02/15/2021   IR ANGIOGRAM VISCERAL SELECTIVE  02/15/2021   IR ANGIOGRAM VISCERAL SELECTIVE  06/26/2021   IR EMBO TUMOR ORGAN ISCHEMIA INFARCT INC GUIDE ROADMAPPING  06/26/2021   IR RADIOLOGIST EVAL & MGMT  01/11/2021   IR RADIOLOGIST EVAL & MGMT  07/27/2021   IR US GUIDE VASC ACCESS RIGHT  02/15/2021   IR US GUIDE VASC ACCESS RIGHT  06/26/2021   LEFT HEART CATH AND CORONARY ANGIOGRAPHY N/A 12/21/2020   Procedure: LEFT HEART CATH AND CORONARY ANGIOGRAPHY;  Surgeon: Troy Sine, MD;  Location: [place] CV LAB;  Service: Cardiovascular;  Laterality: N/A;   LEFT HEART CATH AND CORONARY ANGIOGRAPHY N/A 08/08/2021   Procedure: LEFT HEART CATH AND CORONARY ANGIOGRAPHY;  Surgeon: Burnell Blanks, MD;  Location: [place] CV LAB;  Service: Cardiovascular;  Laterality: N/A;    FAMILY HISTORY:  Family History  Problem Relation Age of Onset   Cancer Mother    Cancer Father     SOCIAL HISTORY:  Social History   Tobacco Use   Smoking status: Every Day  Packs/day: 1.00    Years: 26.00    Total pack years: 26.00    Types: Cigarettes   Smokeless tobacco: Never  Substance Use Topics   Alcohol use: Not Currently    Alcohol/week: 126.0 standard drinks of alcohol    Types: 126 Cans of beer per week    Comment: now a 6-pack a week, previously very heavy use   Drug use: Yes    Types: Cocaine, IV, Heroin, Marijuana    Comment: last used crack about 24 hours ago; no recent heroin; occasional marijuana    ALLERGIES: No Known Allergies  MEDICATIONS:  Current Outpatient Medications  Medication Sig Dispense Refill   allopurinol (ZYLOPRIM) 100 MG tablet TAKE 1 TABLET  BY MOUTH EVERY DAY 90 tablet 1   amLODipine (NORVASC) 5 MG tablet Take 1 tablet (5 mg total) by mouth daily. 90 tablet 3   aspirin 81 MG chewable tablet Chew 1 tablet (81 mg total) by mouth daily. 90 tablet 3   dexamethasone (DECADRON) 4 MG tablet Take 1 tablet (4 mg total) by mouth daily. 30 tablet 0   morphine (MS CONTIN) 15 MG 12 hr tablet Take 1 tablet (15 mg total) by mouth every 12 (twelve) hours. 60 tablet 0   nitroGLYCERIN (NITROSTAT) 0.4 MG SL tablet Place 1 tablet (0.4 mg total) under the tongue every 5 (five) minutes as needed for chest pain. (Patient not taking: Reported on 11/24/2021) 25 tablet 12   ondansetron (ZOFRAN) 8 MG tablet Take 1 tablet (8 mg total) by mouth every 8 (eight) hours as needed for nausea or vomiting. 30 tablet 1   oxyCODONE (OXY IR/ROXICODONE) 5 MG immediate release tablet Take 1-3 tablets (5-15 mg total) by mouth every 6 (six) hours as needed for severe pain. 100 tablet 0   prochlorperazine (COMPAZINE) 10 MG tablet Take 1 tablet (10 mg total) by mouth every 6 (six) hours as needed for nausea. 60 tablet 0   rosuvastatin (CRESTOR) 20 MG tablet Take 1 tablet (20 mg total) by mouth daily. 90 tablet 3   tamsulosin (FLOMAX) 0.4 MG CAPS capsule Take 1 capsule (0.4 mg total) by mouth daily. 90 capsule 0   ticagrelor (BRILINTA) 90 MG TABS tablet Take 1 tablet (90 mg total) by mouth 2 (two) times daily. 120 tablet 3   Current Facility-Administered Medications  Medication Dose Route Frequency Provider Last Rate Last Admin   sodium chloride flush (NS) 0.9 % injection 3 mL  3 mL Intravenous Q12H [name], MD        REVIEW OF SYSTEMS:  A 10+ POINT REVIEW OF SYSTEMS WAS OBTAINED including neurology, dermatology, psychiatry, cardiac, respiratory, lymph, extremities, GI, GU, musculoskeletal, constitutional, reproductive, HEENT. ***   PHYSICAL EXAM:  vitals were not taken for this visit.   General: Alert and oriented, in no acute distress HEENT: Head is normocephalic.  Extraocular movements are intact. Oropharynx is clear. Neck: Neck is supple, no palpable cervical or supraclavicular lymphadenopathy. Heart: Regular in rate and rhythm with no murmurs, rubs, or gallops. Chest: Clear to auscultation bilaterally, with no rhonchi, wheezes, or rales. Abdomen: Soft, nontender, nondistended, with no rigidity or guarding. Extremities: No cyanosis or edema. Lymphatics: see Neck Exam Skin: No concerning lesions. Musculoskeletal: symmetric strength and muscle tone throughout. Neurologic: Cranial nerves II through XII are grossly intact. No obvious focalities. Speech is fluent. Coordination is intact. Psychiatric: Judgment and insight are intact. Affect is appropriate. ***  ECOG = ***  0 - Asymptomatic (Fully active, able to carry  on all predisease activities without restriction)  1 - Symptomatic but completely ambulatory (Restricted in physically strenuous activity but ambulatory and able to carry out work of a light or sedentary nature. For example, light housework, office work)  2 - Symptomatic, <50% in bed during the day (Ambulatory and capable of all self care but unable to carry out any work activities. Up and about more than 50% of waking hours)  3 - Symptomatic, >50% in bed, but not bedbound (Capable of only limited self-care, confined to bed or chair 50% or more of waking hours)  4 - Bedbound (Completely disabled. Cannot carry on any self-care. Totally confined to bed or chair)  5 - Death   Eustace Pen MM, Creech RH, Tormey DC, et al. [phone]). "Toxicity and response criteria of the [facility]". [place] Oncol. 5 (6): 649-55  LABORATORY DATA:  Lab Results  Component Value Date   WBC 4.0 01/01/2022   HGB 9.7 (L) 01/01/2022   HCT 29.4 (L) 01/01/2022   MCV 94.2 01/01/2022   PLT 151 01/01/2022   NEUTROABS 3.5 01/01/2022   Lab Results  Component Value Date   NA 137 01/01/2022   K 3.5 01/01/2022   CL 105 01/01/2022   CO2 19 (L)  01/01/2022   GLUCOSE 116 (H) 01/01/2022   BUN 12 01/01/2022   CREATININE 0.66 01/01/2022   CALCIUM 9.2 01/01/2022      RADIOGRAPHY: CT Angio Chest Pulmonary Embolism (PE) W or WO Contrast  Result Date: 01/22/2022 CLINICAL DATA:  History of prostate cancer. Complains of shortness of breath and fatigue. Rule out pulmonary embolus. EXAM: CT ANGIOGRAPHY CHEST WITH CONTRAST TECHNIQUE: Multidetector CT imaging of the chest was performed using the standard protocol during bolus administration of intravenous contrast. Multiplanar CT image reconstructions and MIPs were obtained to evaluate the vascular anatomy. RADIATION DOSE REDUCTION: This exam was performed according to the departmental dose-optimization program which includes automated exposure control, adjustment of the mA and/or kV according to patient size and/or use of iterative reconstruction technique. CONTRAST:  7mL OMNIPAQUE IOHEXOL 350 MG/ML SOLN COMPARISON:  09/16/2021 FINDINGS: Cardiovascular: Satisfactory opacification of the pulmonary arteries to the segmental level. No evidence of pulmonary embolism. Normal heart size. No pericardial effusion. Aortic atherosclerosis and 3 vessel coronary artery calcifications. Mediastinum/Nodes: Thyroid gland, trachea, and esophagus are unremarkable. No enlarged axillary, mediastinal, or hilar lymph nodes. Lungs/Pleura: Trace left pleural effusion and small right pleural effusion. These are new compared with 01/11/2022. Mild interstitial thickening is identified within the lung bases. No airspace consolidation or atelectasis. No pneumothorax identified. No suspicious lung nodules identified. Upper Abdomen: No acute abnormality within the imaged portions of the upper abdomen. Morphologic features of the liver compatible with cirrhosis. The previously treated mass within right lobe of liver is again noted. This is only partially visualized measuring 5.1 x 4.2 cm, image 129/4. This is similar in size to 01/11/2022.  Musculoskeletal: Again seen are widespread sclerotic bone metastases. See full report from PET-CT dated 01/11/2022 for further details. Review of the MIP images confirms the above findings. IMPRESSION: 1. No evidence for acute pulmonary embolus. 2. Trace left pleural effusion and small right pleural effusion. These are new compared with 01/11/2022. 3. Mild interstitial thickening within the lung bases. Correlate for any clinical signs or symptoms of mild interstitial edema. 4. Morphologic features of the liver compatible with cirrhosis. Previously treated hepatocellular carcinoma within right lobe of liver is again noted. This is only partially visualized measuring 5.1 x 4.2 cm.  This is similar in size to 01/11/2022. 5. Widespread sclerotic bone metastases. See full report from PET-CT dated 01/11/2022 for further details. 6. Aortic Atherosclerosis (ICD10-I70.0). Three vessel coronary artery calcifications. Electronically Signed   By: Kerby Moors M.D.   On: 01/22/2022 15:10   NM PET (PSMA) SKULL TO MID THIGH  Result Date: 01/14/2022 CLINICAL DATA:  Prostate carcinoma with biochemical recurrence. PSA equal 1,078. Bone metastasis. Additional history of hepatocellular carcinoma post yttrium 90 radio embolization EXAM: NUCLEAR MEDICINE PET SKULL BASE TO THIGH TECHNIQUE: 8.6 mCi F18 Piflufolastat (Pylarify) was injected intravenously. Full-ring PET imaging was performed from the skull base to thigh after the radiotracer. CT data was obtained and used for attenuation correction and anatomic localization. COMPARISON:  CT 12/10/2020 FINDINGS: NECK No radiotracer activity in neck lymph nodes. Incidental CT finding: None. CHEST No radiotracer accumulation within mediastinal or hilar lymph nodes. No suspicious pulmonary nodules on the CT scan. Incidental CT finding: None. ABDOMEN/PELVIS Prostate: Mild nonspecific activity within the prostate gland. Intense radiotracer activity within the RIGHT seminal vesicle with SUV  max equal 8.4 (image 199) Lymph nodes: No radiotracer avid pelvic lymph nodes. Several small radiotracer avid periaortic lymph nodes. For example node LEFT of the aorta measuring 10 mm at the level of lower pole LEFT kidney (image 154) with SUV max equal 13.9. Small node ventral to the IVC similar level measuring 4 mm with SUV max equal 6.1 on image 28. Liver: Photopenia within the treated RIGHT hepatic lobe hepatocellular carcinoma. Evidence of prostate cancer liver metastasis Incidental CT finding: None. SKELETON Extensive intensely radiotracer avid sclerotic skeletal metastasis. Lesions reach confluence within the pelvis, spine, ribs and long bones. For example the entirety of the LEFT proximal femur is radiotracer avid with SUV max equal 15.3. Entirety of the sacrum is radiotracer avid with SUV max equal 16.4. Example lesion in the sternum with SUV max equal 10.3. Essentially every vertebral body is involved. IMPRESSION: 1. Dominant finding is widespread intensely radiotracer avid sclerotic skeletal metastasis involving the axillary and appendicular skeleton. Nearly every bone involved. 2. Small volume periaortic retroperitoneal radiotracer avid prostate cancer nodal metastasis. 3. No prostate cancer nodal metastasis in the pelvis. 4. Nonspecific activity in the prostate gland. Radiotracer avid metastasis to the RIGHT Seminal vesicle. 5. No liver prostate cancer metastasis or lung metastasis. Electronically Signed   By: Suzy Bouchard M.D.   On: 01/14/2022 12:49      IMPRESSION: Metastatic prostate cancer; widespread osseous metastatic disease involving nearly every bone - increasing right hip pain related to disease   ***  Today, I talked to the patient and family about the findings and work-up thus far.  We discussed the natural history of *** and general treatment, highlighting the role of radiotherapy in the management.  We discussed the available radiation techniques, and focused on the details of  logistics and delivery.  We reviewed the anticipated acute and late sequelae associated with radiation in this setting.  The patient was encouraged to ask questions that I answered to the best of my ability. *** A patient consent form was discussed and signed.  We retained a copy for our records.  The patient would like to proceed with radiation and will be scheduled for CT simulation.  PLAN: ***    *** minutes of total time was spent for this patient encounter, including preparation, face-to-face counseling with the patient and coordination of care, physical exam, and documentation of the encounter.   ------------------------------------------------  Blair Promise, PhD, MD  This document serves as a record of services personally performed by Gery Pray, MD. It was created on his behalf by Roney Mans, a trained medical scribe. The creation of this record is based on the scribe's personal observations and the provider's statements to them. This document has been checked and approved by the attending provider.

## 2022-01-23 NOTE — Progress Notes (Signed)
Histology and Location of Primary Cancer: metastatic hormone refractory prostate cancer.   Location(s) of Symptomatic Metastases: right pelvis  Past/Anticipated chemotherapy by medical oncology, if any: Pluvicto   Pain on a scale of 0-10 is: {Number; 7-53  not applicable:20727}   Ambulatory status? Walker? Wheelchair?: {VQI Ambulatory Status:20974}  SAFETY ISSUES: Prior radiation? left hip 06/08/2021-06/20/2021 Pacemaker/ICD? {:18581} Possible current pregnancy? no Is the patient on methotrexate? {:18581}  Current Complaints / other details:  ***

## 2022-01-23 NOTE — Telephone Encounter (Signed)
LVM to sched RECON with Dr. Sondra Come

## 2022-01-24 ENCOUNTER — Encounter: Payer: Self-pay | Admitting: Radiation Oncology

## 2022-01-24 ENCOUNTER — Other Ambulatory Visit: Payer: Self-pay

## 2022-01-24 ENCOUNTER — Ambulatory Visit
Admission: RE | Admit: 2022-01-24 | Discharge: 2022-01-24 | Disposition: A | Discharge status: Home / Self Care | Payer: BC Managed Care – PPO | Source: Ambulatory Visit | Attending: Radiation Oncology | Admitting: Radiation Oncology

## 2022-01-24 VITALS — BP 118/99 | HR 93 | Temp 98.4°F | Resp 19 | Ht 62.0 in | Wt 143.2 lb

## 2022-01-24 DIAGNOSIS — C61 Malignant neoplasm of prostate: Secondary | ICD-10-CM

## 2022-01-24 DIAGNOSIS — C7951 Secondary malignant neoplasm of bone: Secondary | ICD-10-CM | POA: Diagnosis not present

## 2022-01-24 DIAGNOSIS — B182 Chronic viral hepatitis C: Secondary | ICD-10-CM | POA: Diagnosis not present

## 2022-01-24 DIAGNOSIS — Z7952 Long term (current) use of systemic steroids: Secondary | ICD-10-CM | POA: Diagnosis not present

## 2022-01-24 DIAGNOSIS — F1410 Cocaine abuse, uncomplicated: Secondary | ICD-10-CM | POA: Insufficient documentation

## 2022-01-24 DIAGNOSIS — I700 Atherosclerosis of aorta: Secondary | ICD-10-CM | POA: Diagnosis not present

## 2022-01-24 DIAGNOSIS — R0600 Dyspnea, unspecified: Secondary | ICD-10-CM | POA: Insufficient documentation

## 2022-01-24 DIAGNOSIS — Z7982 Long term (current) use of aspirin: Secondary | ICD-10-CM | POA: Insufficient documentation

## 2022-01-24 DIAGNOSIS — Z7902 Long term (current) use of antithrombotics/antiplatelets: Secondary | ICD-10-CM | POA: Insufficient documentation

## 2022-01-24 DIAGNOSIS — Z8505 Personal history of malignant neoplasm of liver: Secondary | ICD-10-CM | POA: Diagnosis not present

## 2022-01-24 DIAGNOSIS — Z1509 Genetic susceptibility to other malignant neoplasm: Secondary | ICD-10-CM | POA: Diagnosis not present

## 2022-01-24 DIAGNOSIS — F1010 Alcohol abuse, uncomplicated: Secondary | ICD-10-CM | POA: Diagnosis not present

## 2022-01-24 DIAGNOSIS — M109 Gout, unspecified: Secondary | ICD-10-CM | POA: Insufficient documentation

## 2022-01-24 DIAGNOSIS — Z79899 Other long term (current) drug therapy: Secondary | ICD-10-CM | POA: Insufficient documentation

## 2022-01-24 DIAGNOSIS — J90 Pleural effusion, not elsewhere classified: Secondary | ICD-10-CM | POA: Diagnosis not present

## 2022-01-24 DIAGNOSIS — F17210 Nicotine dependence, cigarettes, uncomplicated: Secondary | ICD-10-CM | POA: Diagnosis not present

## 2022-01-25 ENCOUNTER — Inpatient Hospital Stay: Payer: BC Managed Care – PPO

## 2022-01-25 ENCOUNTER — Other Ambulatory Visit (HOSPITAL_BASED_OUTPATIENT_CLINIC_OR_DEPARTMENT_OTHER): Payer: Self-pay

## 2022-01-25 ENCOUNTER — Ambulatory Visit
Admission: RE | Admit: 2022-01-25 | Discharge: 2022-01-25 | Disposition: A | Discharge status: Home / Self Care | Payer: BC Managed Care – PPO | Source: Ambulatory Visit | Attending: Radiation Oncology | Admitting: Radiation Oncology

## 2022-01-25 ENCOUNTER — Other Ambulatory Visit: Payer: Self-pay

## 2022-01-25 DIAGNOSIS — K7460 Unspecified cirrhosis of liver: Secondary | ICD-10-CM | POA: Diagnosis not present

## 2022-01-25 DIAGNOSIS — R161 Splenomegaly, not elsewhere classified: Secondary | ICD-10-CM | POA: Diagnosis not present

## 2022-01-25 DIAGNOSIS — C61 Malignant neoplasm of prostate: Secondary | ICD-10-CM | POA: Diagnosis not present

## 2022-01-25 DIAGNOSIS — Z5111 Encounter for antineoplastic chemotherapy: Secondary | ICD-10-CM | POA: Insufficient documentation

## 2022-01-25 DIAGNOSIS — D696 Thrombocytopenia, unspecified: Secondary | ICD-10-CM | POA: Insufficient documentation

## 2022-01-25 DIAGNOSIS — C7951 Secondary malignant neoplasm of bone: Secondary | ICD-10-CM | POA: Diagnosis not present

## 2022-01-25 DIAGNOSIS — D649 Anemia, unspecified: Secondary | ICD-10-CM | POA: Diagnosis not present

## 2022-01-25 DIAGNOSIS — Z510 Encounter for antineoplastic radiation therapy: Secondary | ICD-10-CM | POA: Diagnosis not present

## 2022-01-31 DIAGNOSIS — C7951 Secondary malignant neoplasm of bone: Secondary | ICD-10-CM | POA: Diagnosis not present

## 2022-01-31 DIAGNOSIS — D649 Anemia, unspecified: Secondary | ICD-10-CM | POA: Diagnosis not present

## 2022-01-31 DIAGNOSIS — R161 Splenomegaly, not elsewhere classified: Secondary | ICD-10-CM | POA: Diagnosis not present

## 2022-01-31 DIAGNOSIS — C61 Malignant neoplasm of prostate: Secondary | ICD-10-CM | POA: Diagnosis not present

## 2022-01-31 DIAGNOSIS — K7460 Unspecified cirrhosis of liver: Secondary | ICD-10-CM | POA: Diagnosis not present

## 2022-01-31 DIAGNOSIS — D696 Thrombocytopenia, unspecified: Secondary | ICD-10-CM | POA: Diagnosis not present

## 2022-01-31 DIAGNOSIS — Z5111 Encounter for antineoplastic chemotherapy: Secondary | ICD-10-CM | POA: Diagnosis not present

## 2022-01-31 DIAGNOSIS — Z510 Encounter for antineoplastic radiation therapy: Secondary | ICD-10-CM | POA: Diagnosis not present

## 2022-02-01 ENCOUNTER — Other Ambulatory Visit: Payer: Self-pay

## 2022-02-01 ENCOUNTER — Ambulatory Visit
Admission: RE | Admit: 2022-02-01 | Discharge: 2022-02-01 | Disposition: A | Discharge status: Home / Self Care | Payer: BC Managed Care – PPO | Source: Ambulatory Visit | Attending: Radiation Oncology | Admitting: Radiation Oncology

## 2022-02-01 ENCOUNTER — Inpatient Hospital Stay: Payer: BC Managed Care – PPO

## 2022-02-01 DIAGNOSIS — K7460 Unspecified cirrhosis of liver: Secondary | ICD-10-CM | POA: Diagnosis not present

## 2022-02-01 DIAGNOSIS — C7951 Secondary malignant neoplasm of bone: Secondary | ICD-10-CM

## 2022-02-01 DIAGNOSIS — R161 Splenomegaly, not elsewhere classified: Secondary | ICD-10-CM | POA: Diagnosis not present

## 2022-02-01 DIAGNOSIS — C61 Malignant neoplasm of prostate: Secondary | ICD-10-CM | POA: Diagnosis not present

## 2022-02-01 DIAGNOSIS — D649 Anemia, unspecified: Secondary | ICD-10-CM | POA: Diagnosis not present

## 2022-02-01 DIAGNOSIS — Z510 Encounter for antineoplastic radiation therapy: Secondary | ICD-10-CM | POA: Diagnosis not present

## 2022-02-01 DIAGNOSIS — D696 Thrombocytopenia, unspecified: Secondary | ICD-10-CM | POA: Diagnosis not present

## 2022-02-01 DIAGNOSIS — Z5111 Encounter for antineoplastic chemotherapy: Secondary | ICD-10-CM | POA: Diagnosis not present

## 2022-02-01 LAB — RAD ONC ARIA SESSION SUMMARY
Course Elapsed Days: 0
Plan Fractions Treated to Date: 1
Plan Prescribed Dose Per Fraction: 3.5 Gy
Plan Total Fractions Prescribed: 8
Plan Total Prescribed Dose: 28 Gy
Reference Point Dosage Given to Date: 3.5 Gy
Reference Point Session Dosage Given: 3.5 Gy
Session Number: 1

## 2022-02-02 ENCOUNTER — Ambulatory Visit
Admission: RE | Admit: 2022-02-02 | Discharge: 2022-02-02 | Disposition: A | Discharge status: Home / Self Care | Payer: BC Managed Care – PPO | Source: Ambulatory Visit | Attending: Radiation Oncology | Admitting: Radiation Oncology

## 2022-02-02 ENCOUNTER — Other Ambulatory Visit: Payer: Self-pay

## 2022-02-02 ENCOUNTER — Inpatient Hospital Stay: Payer: BC Managed Care – PPO

## 2022-02-02 DIAGNOSIS — Z5111 Encounter for antineoplastic chemotherapy: Secondary | ICD-10-CM | POA: Diagnosis not present

## 2022-02-02 DIAGNOSIS — R161 Splenomegaly, not elsewhere classified: Secondary | ICD-10-CM | POA: Diagnosis not present

## 2022-02-02 DIAGNOSIS — Z510 Encounter for antineoplastic radiation therapy: Secondary | ICD-10-CM | POA: Diagnosis not present

## 2022-02-02 DIAGNOSIS — D696 Thrombocytopenia, unspecified: Secondary | ICD-10-CM | POA: Diagnosis not present

## 2022-02-02 DIAGNOSIS — C61 Malignant neoplasm of prostate: Secondary | ICD-10-CM | POA: Diagnosis not present

## 2022-02-02 DIAGNOSIS — C7951 Secondary malignant neoplasm of bone: Secondary | ICD-10-CM | POA: Diagnosis not present

## 2022-02-02 DIAGNOSIS — D649 Anemia, unspecified: Secondary | ICD-10-CM | POA: Diagnosis not present

## 2022-02-02 DIAGNOSIS — K7460 Unspecified cirrhosis of liver: Secondary | ICD-10-CM | POA: Diagnosis not present

## 2022-02-02 LAB — RAD ONC ARIA SESSION SUMMARY
Course Elapsed Days: 1
Plan Fractions Treated to Date: 2
Plan Prescribed Dose Per Fraction: 3.5 Gy
Plan Total Fractions Prescribed: 8
Plan Total Prescribed Dose: 28 Gy
Reference Point Dosage Given to Date: 7 Gy
Reference Point Session Dosage Given: 3.5 Gy
Session Number: 2

## 2022-02-05 ENCOUNTER — Other Ambulatory Visit (HOSPITAL_BASED_OUTPATIENT_CLINIC_OR_DEPARTMENT_OTHER): Payer: Self-pay

## 2022-02-05 ENCOUNTER — Ambulatory Visit
Admission: RE | Admit: 2022-02-05 | Discharge: 2022-02-05 | Disposition: A | Discharge status: Home / Self Care | Payer: BC Managed Care – PPO | Source: Ambulatory Visit | Attending: Radiation Oncology | Admitting: Radiation Oncology

## 2022-02-05 ENCOUNTER — Other Ambulatory Visit: Payer: Self-pay | Admitting: Oncology

## 2022-02-05 ENCOUNTER — Inpatient Hospital Stay: Payer: BC Managed Care – PPO | Admitting: Oncology

## 2022-02-05 ENCOUNTER — Other Ambulatory Visit: Payer: Self-pay

## 2022-02-05 ENCOUNTER — Inpatient Hospital Stay: Payer: BC Managed Care – PPO

## 2022-02-05 ENCOUNTER — Other Ambulatory Visit: Payer: Self-pay | Admitting: Nurse Practitioner

## 2022-02-05 DIAGNOSIS — Z510 Encounter for antineoplastic radiation therapy: Secondary | ICD-10-CM | POA: Diagnosis not present

## 2022-02-05 DIAGNOSIS — C7951 Secondary malignant neoplasm of bone: Secondary | ICD-10-CM | POA: Diagnosis not present

## 2022-02-05 DIAGNOSIS — K7460 Unspecified cirrhosis of liver: Secondary | ICD-10-CM | POA: Diagnosis not present

## 2022-02-05 DIAGNOSIS — C61 Malignant neoplasm of prostate: Secondary | ICD-10-CM | POA: Diagnosis not present

## 2022-02-05 DIAGNOSIS — R161 Splenomegaly, not elsewhere classified: Secondary | ICD-10-CM | POA: Diagnosis not present

## 2022-02-05 DIAGNOSIS — Z5111 Encounter for antineoplastic chemotherapy: Secondary | ICD-10-CM | POA: Diagnosis not present

## 2022-02-05 DIAGNOSIS — D696 Thrombocytopenia, unspecified: Secondary | ICD-10-CM | POA: Diagnosis not present

## 2022-02-05 DIAGNOSIS — D649 Anemia, unspecified: Secondary | ICD-10-CM | POA: Diagnosis not present

## 2022-02-05 LAB — RAD ONC ARIA SESSION SUMMARY
Course Elapsed Days: 4
Plan Fractions Treated to Date: 3
Plan Prescribed Dose Per Fraction: 3.5 Gy
Plan Total Fractions Prescribed: 8
Plan Total Prescribed Dose: 28 Gy
Reference Point Dosage Given to Date: 10.5 Gy
Reference Point Session Dosage Given: 3.5 Gy
Session Number: 3

## 2022-02-05 MED ORDER — OXYCODONE HCL 5 MG PO TABS
5.0000 mg | ORAL_TABLET | Freq: Four times a day (QID) | ORAL | 0 refills | Status: DC | PRN
  Filled 2022-02-05: qty 100, 9d supply, fill #0

## 2022-02-06 ENCOUNTER — Inpatient Hospital Stay: Payer: BC Managed Care – PPO

## 2022-02-06 ENCOUNTER — Encounter: Payer: Self-pay | Admitting: *Deleted

## 2022-02-06 ENCOUNTER — Ambulatory Visit
Admission: RE | Admit: 2022-02-06 | Discharge: 2022-02-06 | Disposition: A | Discharge status: Home / Self Care | Payer: BC Managed Care – PPO | Source: Ambulatory Visit | Attending: Radiation Oncology | Admitting: Radiation Oncology

## 2022-02-06 ENCOUNTER — Other Ambulatory Visit: Payer: Self-pay

## 2022-02-06 ENCOUNTER — Inpatient Hospital Stay (HOSPITAL_BASED_OUTPATIENT_CLINIC_OR_DEPARTMENT_OTHER): Payer: BC Managed Care – PPO | Admitting: Oncology

## 2022-02-06 ENCOUNTER — Other Ambulatory Visit (HOSPITAL_BASED_OUTPATIENT_CLINIC_OR_DEPARTMENT_OTHER): Payer: Self-pay

## 2022-02-06 ENCOUNTER — Encounter: Payer: Self-pay | Admitting: Dietician

## 2022-02-06 VITALS — BP 144/70 | HR 76 | Temp 98.2°F | Resp 18 | Ht 62.0 in | Wt 133.8 lb

## 2022-02-06 DIAGNOSIS — C7951 Secondary malignant neoplasm of bone: Secondary | ICD-10-CM | POA: Diagnosis not present

## 2022-02-06 DIAGNOSIS — C61 Malignant neoplasm of prostate: Secondary | ICD-10-CM | POA: Diagnosis not present

## 2022-02-06 DIAGNOSIS — R161 Splenomegaly, not elsewhere classified: Secondary | ICD-10-CM | POA: Diagnosis not present

## 2022-02-06 DIAGNOSIS — D696 Thrombocytopenia, unspecified: Secondary | ICD-10-CM | POA: Diagnosis not present

## 2022-02-06 DIAGNOSIS — Z5111 Encounter for antineoplastic chemotherapy: Secondary | ICD-10-CM | POA: Diagnosis not present

## 2022-02-06 DIAGNOSIS — Z510 Encounter for antineoplastic radiation therapy: Secondary | ICD-10-CM | POA: Diagnosis not present

## 2022-02-06 DIAGNOSIS — K7460 Unspecified cirrhosis of liver: Secondary | ICD-10-CM | POA: Diagnosis not present

## 2022-02-06 DIAGNOSIS — D649 Anemia, unspecified: Secondary | ICD-10-CM | POA: Diagnosis not present

## 2022-02-06 DIAGNOSIS — Z23 Encounter for immunization: Secondary | ICD-10-CM

## 2022-02-06 LAB — CMP (CANCER CENTER ONLY)
ALT: 35 U/L (ref 0–44)
AST: 31 U/L (ref 15–41)
Albumin: 3.9 g/dL (ref 3.5–5.0)
Alkaline Phosphatase: 181 U/L — ABNORMAL HIGH (ref 38–126)
Anion gap: 11 (ref 5–15)
BUN: 35 mg/dL — ABNORMAL HIGH (ref 6–20)
CO2: 20 mmol/L — ABNORMAL LOW (ref 22–32)
Calcium: 9 mg/dL (ref 8.9–10.3)
Chloride: 102 mmol/L (ref 98–111)
Creatinine: 0.54 mg/dL — ABNORMAL LOW (ref 0.61–1.24)
GFR, Estimated: >60 mL/min (ref >60)
Glucose, Bld: 143 mg/dL — ABNORMAL HIGH (ref 70–99)
Potassium: 4.3 mmol/L (ref 3.5–5.1)
Sodium: 133 mmol/L — ABNORMAL LOW (ref 135–145)
Total Bilirubin: 0.9 mg/dL (ref 0.3–1.2)
Total Protein: 6.2 g/dL — ABNORMAL LOW (ref 6.5–8.1)

## 2022-02-06 LAB — CBC WITH DIFFERENTIAL (CANCER CENTER ONLY)
Abs Immature Granulocytes: 0.78 10*3/uL — ABNORMAL HIGH (ref 0.00–0.07)
Basophils Absolute: 0 10*3/uL (ref 0.0–0.1)
Basophils Relative: 0 %
Eosinophils Absolute: 0 10*3/uL (ref 0.0–0.5)
Eosinophils Relative: 0 %
HCT: 29.4 % — ABNORMAL LOW (ref 39.0–52.0)
Hemoglobin: 9.6 g/dL — ABNORMAL LOW (ref 13.0–17.0)
Immature Granulocytes: 7 %
Lymphocytes Relative: 4 %
Lymphs Abs: 0.4 10*3/uL — ABNORMAL LOW (ref 0.7–4.0)
MCH: 31.6 pg (ref 26.0–34.0)
MCHC: 32.7 g/dL (ref 30.0–36.0)
MCV: 96.7 fL (ref 80.0–100.0)
Monocytes Absolute: 0.7 10*3/uL (ref 0.1–1.0)
Monocytes Relative: 6 %
Neutro Abs: 9.9 10*3/uL — ABNORMAL HIGH (ref 1.7–7.7)
Neutrophils Relative %: 83 %
Platelet Count: 125 10*3/uL — ABNORMAL LOW (ref 150–400)
RBC: 3.04 MIL/uL — ABNORMAL LOW (ref 4.22–5.81)
RDW: 18.8 % — ABNORMAL HIGH (ref 11.5–15.5)
WBC Count: 11.8 10*3/uL — ABNORMAL HIGH (ref 4.0–10.5)
nRBC: 0.7 % — ABNORMAL HIGH (ref 0.0–0.2)

## 2022-02-06 LAB — RAD ONC ARIA SESSION SUMMARY
Course Elapsed Days: 5
Plan Fractions Treated to Date: 4
Plan Prescribed Dose Per Fraction: 3.5 Gy
Plan Total Fractions Prescribed: 8
Plan Total Prescribed Dose: 28 Gy
Reference Point Dosage Given to Date: 14 Gy
Reference Point Session Dosage Given: 3.5 Gy
Session Number: 4

## 2022-02-06 MED ORDER — POLYETHYLENE GLYCOL 3350 17 GM/SCOOP PO POWD
17.0000 g | Freq: Every day | ORAL | 1 refills | Status: DC
  Filled 2022-02-06: qty 238, 14d supply, fill #0

## 2022-02-06 MED ORDER — ZOLEDRONIC ACID 4 MG/100ML IV SOLN
4.0000 mg | Freq: Once | INTRAVENOUS | Status: AC
  Administered 2022-02-06: 4 mg via INTRAVENOUS
  Filled 2022-02-06: qty 100

## 2022-02-06 MED ORDER — SODIUM CHLORIDE 0.9 % IV SOLN: Freq: Once | INTRAVENOUS | Status: AC

## 2022-02-06 MED ORDER — DOCUSATE SODIUM 100 MG PO CAPS
100.0000 mg | ORAL_CAPSULE | Freq: Two times a day (BID) | ORAL | 1 refills | Status: DC
  Filled 2022-02-06: qty 100, 50d supply, fill #0

## 2022-02-06 MED ORDER — DEXAMETHASONE 4 MG PO TABS
4.0000 mg | ORAL_TABLET | Freq: Every day | ORAL | 1 refills | Status: DC
  Filled 2022-02-06: qty 30, 30d supply, fill #0

## 2022-02-06 MED ORDER — LEUPROLIDE ACETATE (3 MONTH) 22.5 MG ~~LOC~~ KIT
22.5000 mg | PACK | Freq: Once | SUBCUTANEOUS | Status: AC
  Administered 2022-02-06: 22.5 mg via SUBCUTANEOUS
  Filled 2022-02-06: qty 22.5

## 2022-02-06 MED ORDER — FLUCONAZOLE 100 MG PO TABS
100.0000 mg | ORAL_TABLET | Freq: Every day | ORAL | 0 refills | Status: DC
  Filled 2022-02-06: qty 7, 7d supply, fill #0

## 2022-02-06 MED ORDER — MORPHINE SULFATE ER 30 MG PO TBCR
30.0000 mg | EXTENDED_RELEASE_TABLET | Freq: Two times a day (BID) | ORAL | 0 refills | Status: DC
  Filled 2022-02-06 – 2022-02-09 (×2): qty 60, 30d supply, fill #0

## 2022-02-06 NOTE — Progress Notes (Signed)
Patient seen by Dr. Benay Spice today  Vitals are within treatment parameters. MD is aware of continued weight loss  Labs reviewed by Dr. Benay Spice and are within treatment parameters.  Per physician team, patient is ready for treatment and there are NO modifications to the treatment plan. Requesting flu vaccine today as well.

## 2022-02-06 NOTE — Patient Instructions (Signed)
Leuprolide Suspension for Injection (Prostate Cancer) What is this medication? LEUPROLIDE (loo PROE lide) reduces the symptoms of prostate cancer. It works by decreasing levels of the hormone testosterone in the body. This prevents prostate cancer cells from spreading or growing. This medicine may be used for other purposes; ask your health care provider or pharmacist if you have questions. COMMON BRAND NAME(S): Eligard, Fensolvi, Lupron Depot, Lupron Depot-Ped, Lutrate Depot, Viadur What should I tell my care team before I take this medication? They need to know if you have any of these conditions: Diabetes Heart disease Heart failure High or low levels of electrolytes, such as magnesium, potassium, or sodium in your blood Irregular heartbeat or rhythm Seizures An unusual or allergic reaction to leuprolide, other medications, foods, dyes, or preservatives Pregnant or trying to get pregnant Breast-feeding How should I use this medication? This medication is injected under the skin or into a muscle. It is given by your care team in a hospital or clinic setting. Talk to your care team about the use of this medication in children. Special care may be needed. Overdosage: If you think you have taken too much of this medicine contact a poison control center or emergency room at once. NOTE: This medicine is only for you. Do not share this medicine with others. What if I miss a dose? Keep appointments for follow-up doses. It is important not to miss your dose. Call your care team if you are unable to keep an appointment. What may interact with this medication? Do not take this medication with any of the following: Cisapride Dronedarone Ketoconazole Levoketoconazole Pimozide Thioridazine This medication may also interact with the following: Other medications that cause heart rhythm changes This list may not describe all possible interactions. Give your health care provider a list of all the  medicines, herbs, non-prescription drugs, or dietary supplements you use. Also tell them if you smoke, drink alcohol, or use illegal drugs. Some items may interact with your medicine. What should I watch for while using this medication? Visit your care team for regular checks on your progress. Tell your care team if your symptoms do not start to get better or if they get worse. This medication may increase blood sugar. The risk may be higher in patients who already have diabetes. Ask your care team what you can do to lower the risk of diabetes while taking this medication. This medication may cause infertility. Talk to your care team if you are concerned about your fertility. Heart attacks and strokes have been reported with the use of this medication. Get emergency help if you develop signs or symptoms of a heart attack or stroke. Talk to your care team about the risks and benefits of this medication. What side effects may I notice from receiving this medication? Side effects that you should report to your care team as soon as possible: Allergic reactions--skin rash, itching, hives, swelling of the face, lips, tongue, or throat Heart attack--pain or tightness in the chest, shoulders, arms, or jaw, nausea, shortness of breath, cold or clammy skin, feeling faint or lightheaded Heart rhythm changes--fast or irregular heartbeat, dizziness, feeling faint or lightheaded, chest pain, trouble breathing High blood sugar (hyperglycemia)--increased thirst or amount of urine, unusual weakness or fatigue, blurry vision Mood swings, irritability, hostility Seizures Stroke--sudden numbness or weakness of the face, arm, or leg, trouble speaking, confusion, trouble walking, loss of balance or coordination, dizziness, severe headache, change in vision Thoughts of suicide or self-harm, worsening mood, feelings of depression  Side effects that usually do not require medical attention (report to your care team if they  continue or are bothersome): Bone pain Change in sex drive or performance General discomfort and fatigue Hot flashes Muscle pain Pain, redness, or irritation at injection site Swelling of the ankles, hands, or feet This list may not describe all possible side effects. Call your doctor for medical advice about side effects. You may report side effects to FDA at 1-800-FDA-1088. Where should I keep my medication? This medication is given in a hospital or clinic. It will not be stored at home. NOTE: This sheet is a summary. It may not cover all possible information. If you have questions about this medicine, talk to your doctor, pharmacist, or health care provider.  2023 Elsevier/Gold Standard (2021-06-12 00:00:00)  Zoledronic Acid Injection (Cancer) What is this medication? ZOLEDRONIC ACID (ZOE le dron ik AS id) treats high calcium levels in the blood caused by cancer. It may also be used with chemotherapy to treat weakened bones caused by cancer. It works by slowing down the release of calcium from bones. This lowers calcium levels in your blood. It also makes your bones stronger and less likely to break (fracture). It belongs to a group of medications called bisphosphonates. This medicine may be used for other purposes; ask your health care provider or pharmacist if you have questions. COMMON BRAND NAME(S): Zometa, Zometa Powder What should I tell my care team before I take this medication? They need to know if you have any of these conditions: Dehydration Dental disease Kidney disease Liver disease Low levels of calcium in the blood Lung or breathing disease, such as asthma Receiving steroids, such as dexamethasone or prednisone An unusual or allergic reaction to zoledronic acid, other medications, foods, dyes, or preservatives Pregnant or trying to get pregnant Breast-feeding How should I use this medication? This medication is injected into a vein. It is given by your care team in  a hospital or clinic setting. Talk to your care team about the use of this medication in children. Special care may be needed. Overdosage: If you think you have taken too much of this medicine contact a poison control center or emergency room at once. NOTE: This medicine is only for you. Do not share this medicine with others. What if I miss a dose? Keep appointments for follow-up doses. It is important not to miss your dose. Call your care team if you are unable to keep an appointment. What may interact with this medication? Certain antibiotics given by injection Diuretics, such as bumetanide, furosemide NSAIDs, medications for pain and inflammation, such as ibuprofen or naproxen Teriparatide Thalidomide This list may not describe all possible interactions. Give your health care provider a list of all the medicines, herbs, non-prescription drugs, or dietary supplements you use. Also tell them if you smoke, drink alcohol, or use illegal drugs. Some items may interact with your medicine. What should I watch for while using this medication? Visit your care team for regular checks on your progress. It may be some time before you see the benefit from this medication. Some people who take this medication have severe bone, joint, or muscle pain. This medication may also increase your risk for jaw problems or a broken thigh bone. Tell your care team right away if you have severe pain in your jaw, bones, joints, or muscles. Tell you care team if you have any pain that does not go away or that gets worse. Tell your dentist and dental  surgeon that you are taking this medication. You should not have major dental surgery while on this medication. See your dentist to have a dental exam and fix any dental problems before starting this medication. Take good care of your teeth while on this medication. Make sure you see your dentist for regular follow-up appointments. You should make sure you get enough calcium and  vitamin D while you are taking this medication. Discuss the foods you eat and the vitamins you take with your care team. Check with your care team if you have severe diarrhea, nausea, and vomiting, or if you sweat a lot. The loss of too much body fluid may make it dangerous for you to take this medication. You may need bloodwork while taking this medication. Talk to your care team if you wish to become pregnant or think you might be pregnant. This medication can cause serious birth defects. What side effects may I notice from receiving this medication? Side effects that you should report to your care team as soon as possible: Allergic reactions--skin rash, itching, hives, swelling of the face, lips, tongue, or throat Kidney injury--decrease in the amount of urine, swelling of the ankles, hands, or feet Low calcium level--muscle pain or cramps, confusion, tingling, or numbness in the hands or feet Osteonecrosis of the jaw--pain, swelling, or redness in the mouth, numbness of the jaw, poor healing after dental work, unusual discharge from the mouth, visible bones in the mouth Severe bone, joint, or muscle pain Side effects that usually do not require medical attention (report to your care team if they continue or are bothersome): Constipation Fatigue Fever Loss of appetite Nausea Stomach pain This list may not describe all possible side effects. Call your doctor for medical advice about side effects. You may report side effects to FDA at 1-800-FDA-1088. Where should I keep my medication? This medication is given in a hospital or clinic. It will not be stored at home. NOTE: This sheet is a summary. It may not cover all possible information. If you have questions about this medicine, talk to your doctor, pharmacist, or health care provider.  2023 Elsevier/Gold Standard (2021-05-18 00:00:00)

## 2022-02-06 NOTE — Progress Notes (Signed)
[facility] OFFICE PROGRESS NOTE   Diagnosis: Prostate cancer, hepatocellular carcinoma  INTERVAL HISTORY:   Mr. Munter returns as scheduled.  He began palliative radiation to the right pelvis and right femur on 02/01/2022.  He continues to have pain, chiefly at the right lower back.  He takes oxycodone 4-5 times per day in addition to MS Contin.  He reports constipation.  He complains of dysphagia with solids and liquids.  No emesis.  He has developed hiccups.  Objective:  Vital signs in last 24 hours:  Blood pressure (!) 144/70, pulse 76, temperature 98.2 F (36.8 C), temperature source Oral, resp. rate 18, height $RemoveBe'5\' 2"'uEqZOkoOf$  (1.575 m), weight 133 lb 12.8 oz (60.7 kg), SpO2 98 %.    HEENT: Thrush at the buccal mucosa and pharynx Resp: Lungs clear bilaterally Cardio: Regular rate and rhythm GI: No hepatosplenomegaly Vascular: No leg edema Musculoskeletal: No tenderness at the right flank or mid spine, firm skull-based masses at the forehead and anterior parietal scalp  Lab Results:  Lab Results  Component Value Date   WBC 11.8 (H) 02/06/2022   HGB 9.6 (L) 02/06/2022   HCT 29.4 (L) 02/06/2022   MCV 96.7 02/06/2022   PLT 125 (L) 02/06/2022   NEUTROABS PENDING 02/06/2022    CMP  Lab Results  Component Value Date   NA 137 01/01/2022   K 3.5 01/01/2022   CL 105 01/01/2022   CO2 19 (L) 01/01/2022   GLUCOSE 116 (H) 01/01/2022   BUN 12 01/01/2022   CREATININE 0.66 01/01/2022   CALCIUM 9.2 01/01/2022   PROT 7.4 01/01/2022   ALBUMIN 4.0 01/01/2022   AST 14 (L) 01/01/2022   ALT 10 01/01/2022   ALKPHOS 215 (H) 01/01/2022   BILITOT 0.6 01/01/2022   GFRNONAA >60 01/01/2022   GFRAA 121 12/19/2017     Medications: I have reviewed the patient's current medications.   Assessment/Plan: Metastatic prostate cancer - Lytic bone lesions with retroperitoneal adenopathy concerning for metastatic prostate cancer -03/03/2020-CTA chest/abdomen/pelvis widespread  osseous metastatic disease and lower retroperitoneal adenopathy, pathologic right anterior third rib fracture, probable spinal canal tumor at the level of S1, -MRI cervical, thoracic, and lumbar spine 03/03/2020-diffuse osseous metastatic disease, ventral epidural tumor impinging on right S1 nerve root, extraosseous tumor at the bilateral ilium, asymmetric enhancing material at the right C5-6 canal-right  -03/03/2020-PSA 763 -Degarelix 03/04/2020 -Abiraterone/prednisone 03/14/2020 -Every 12-month Lupron 04/01/2020 -04/01/2020 PSA 36.5 -05/31/2020 PSA 1.2 -06/28/2020 PSA 1.0 -12/14/2020 PSA 6.4 -01/13/2021 PSA 10.6 -04/26/2021 PSA  45.2 -05/16/2021 PSA 70 -Abiraterone/prednisone discontinued 05/16/2021 -05/16/2021 left hip x-ray-multifocal sclerotic metastasis throughout the pelvis, confluent involving the left acetabulum.  No acute or pathologic fracture.  Left hip osteoarthritis. -Cycle 1 docetaxel 05/31/2021 -05/31/2021 PSA 113 -Palliative radiation to the left acetabulum 06/08/2021 - 06/20/2021 -Cycle 2 docetaxel 06/21/2021 -06/21/2021 PSA improved at 68 -Cycle 3 docetaxel 07/12/2021 -07/12/2021 PSA improved at 52 -Cycle 4 docetaxel 08/02/2021 -Cycle 5 docetaxel 08/22/2021 -CT chest 09/16/2021-negative for pulmonary embolism, right liver mass, extensive bone metastases -Cycle 6 docetaxel 09/28/2021 -PSA increased 09/28/2021 -Olaparib 10/24/2021, patient decreased dose to 1 tablet twice daily on 10/31/2021 due to muscle spasms; dose resumed at prescribed amount 11/06/2021 -PSA increased 11/24/2021 -PSA further increased 12/08/2021 -Olaparib discontinued -PET PSMA scan 01/11/2022-widespread intensely radiotracer avid sclerotic skeletal metastasis involving the axillary and appendicular skeleton.  Small-volume periaortic retroperitoneal radiotracer avid prostate cancer nodal metastasis.  Nonspecific activity in the prostate gland.  No liver or lung prostate cancer metastasis. -Palliative radiation to the right pelvis  and proximal right femur 02/01/2022   2.  Severe anemia-likely secondary to metastatic prostate cancer involving the bones 3.  Mild thrombocytopenia 4.  Cirrhosis with splenomegaly 5.  History of hepatitis C 6.  History of polysubstance abuse 7.  Depression 8.  Tobacco dependence 9.  Right arm weakness, right facial numbness-potentially related to nerve root compromise from metastatic bone lesions 10.  Pain secondary to #1 11.  Extensive bone metastases-every 58-month Zometa starting 04/01/2020 12.  Gout-acute flare right wrist 04/01/2020 treated with indomethacin, allopurinol resumed 13.  Right hepatic lobe mass with elevated AFP concerning for [facility] MRI abdomen 12/21/2020-hypoenhancing inferior right liver mass, segment 6, occluding or compressing the adjacent right portal vein branch, intrahepatic cholangiocarcinoma favored with differential including hepatocellular carcinoma, Li-Rads M Y 90 planning study 11-22 Y90 right lobe of liver 06/26/2021 14.  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  Presentation to the [facility] emergency room 09/16/2021 with chest pain-transfer to [facility] on a morphine drip and scheduled Ativan       Disposition: Mr. Demetriou has metastatic prostate cancer.  He will receive Zometa and Lupron today.  He is completing a course of paly radiation to the right pelvis and proximal right femur.  He continues to have significant pain in multiple sites.  I will increase the MS Contin to 30 mg every 12 hours.  He continues Decadron.  He is waiting on Pluvicto therapy.  The dysphagia appears to be related to oropharyngeal candidiasis.  He will complete a course of Diflucan.  Moroney will return for an office visit after the completion of palliative radiation.  He will begin a stool softener and  MiraLAX for constipation.  Betsy Coder, MD  02/06/2022  9:17 AM

## 2022-02-06 NOTE — Progress Notes (Signed)
Patient provided one complimentary case of Ensure Plus HP on 02/05/22

## 2022-02-07 ENCOUNTER — Ambulatory Visit
Admission: RE | Admit: 2022-02-07 | Discharge: 2022-02-07 | Disposition: A | Discharge status: Home / Self Care | Payer: BC Managed Care – PPO | Source: Ambulatory Visit | Attending: Radiation Oncology | Admitting: Radiation Oncology

## 2022-02-07 ENCOUNTER — Inpatient Hospital Stay: Payer: BC Managed Care – PPO

## 2022-02-07 ENCOUNTER — Ambulatory Visit (HOSPITAL_COMMUNITY)
Admission: RE | Admit: 2022-02-07 | Discharge: 2022-02-07 | Disposition: A | Discharge status: Home / Self Care | Payer: BC Managed Care – PPO | Source: Ambulatory Visit | Attending: Oncology | Admitting: Oncology

## 2022-02-07 ENCOUNTER — Other Ambulatory Visit: Payer: Self-pay

## 2022-02-07 ENCOUNTER — Other Ambulatory Visit (HOSPITAL_COMMUNITY): Payer: Self-pay | Admitting: Oncology

## 2022-02-07 DIAGNOSIS — R161 Splenomegaly, not elsewhere classified: Secondary | ICD-10-CM | POA: Diagnosis not present

## 2022-02-07 DIAGNOSIS — Z510 Encounter for antineoplastic radiation therapy: Secondary | ICD-10-CM | POA: Diagnosis not present

## 2022-02-07 DIAGNOSIS — D696 Thrombocytopenia, unspecified: Secondary | ICD-10-CM | POA: Diagnosis not present

## 2022-02-07 DIAGNOSIS — C61 Malignant neoplasm of prostate: Secondary | ICD-10-CM | POA: Insufficient documentation

## 2022-02-07 DIAGNOSIS — K7460 Unspecified cirrhosis of liver: Secondary | ICD-10-CM | POA: Diagnosis not present

## 2022-02-07 DIAGNOSIS — D649 Anemia, unspecified: Secondary | ICD-10-CM | POA: Diagnosis not present

## 2022-02-07 DIAGNOSIS — C7951 Secondary malignant neoplasm of bone: Secondary | ICD-10-CM | POA: Diagnosis not present

## 2022-02-07 DIAGNOSIS — Z5111 Encounter for antineoplastic chemotherapy: Secondary | ICD-10-CM | POA: Diagnosis not present

## 2022-02-07 LAB — RAD ONC ARIA SESSION SUMMARY
Course Elapsed Days: 6
Plan Fractions Treated to Date: 5
Plan Prescribed Dose Per Fraction: 3.5 Gy
Plan Total Fractions Prescribed: 8
Plan Total Prescribed Dose: 28 Gy
Reference Point Dosage Given to Date: 17.5 Gy
Reference Point Session Dosage Given: 3.5 Gy
Session Number: 5

## 2022-02-07 LAB — PROSTATE-SPECIFIC AG, SERUM (LABCORP): Prostate Specific Ag, Serum: 1444 ng/mL — ABNORMAL HIGH (ref 0.0–4.0)

## 2022-02-08 ENCOUNTER — Ambulatory Visit: Payer: BC Managed Care – PPO

## 2022-02-08 ENCOUNTER — Inpatient Hospital Stay: Payer: BC Managed Care – PPO

## 2022-02-08 ENCOUNTER — Other Ambulatory Visit: Payer: Self-pay | Admitting: *Deleted

## 2022-02-08 DIAGNOSIS — C61 Malignant neoplasm of prostate: Secondary | ICD-10-CM

## 2022-02-08 NOTE — Progress Notes (Signed)
1st Pluvicto administration scheduled for 03/13/22. Entered labs for 1 week prior per radiology protocol (CBC,CMP,PSA) due on 03/05/22.

## 2022-02-09 ENCOUNTER — Ambulatory Visit
Admission: RE | Admit: 2022-02-09 | Discharge: 2022-02-09 | Disposition: A | Discharge status: Home / Self Care | Payer: BC Managed Care – PPO | Source: Ambulatory Visit | Attending: Radiation Oncology | Admitting: Radiation Oncology

## 2022-02-09 ENCOUNTER — Other Ambulatory Visit (HOSPITAL_BASED_OUTPATIENT_CLINIC_OR_DEPARTMENT_OTHER): Payer: Self-pay

## 2022-02-09 ENCOUNTER — Other Ambulatory Visit: Payer: Self-pay

## 2022-02-09 ENCOUNTER — Inpatient Hospital Stay: Payer: BC Managed Care – PPO

## 2022-02-09 DIAGNOSIS — D649 Anemia, unspecified: Secondary | ICD-10-CM | POA: Diagnosis not present

## 2022-02-09 DIAGNOSIS — Z5111 Encounter for antineoplastic chemotherapy: Secondary | ICD-10-CM | POA: Diagnosis not present

## 2022-02-09 DIAGNOSIS — C61 Malignant neoplasm of prostate: Secondary | ICD-10-CM | POA: Diagnosis not present

## 2022-02-09 DIAGNOSIS — R161 Splenomegaly, not elsewhere classified: Secondary | ICD-10-CM | POA: Diagnosis not present

## 2022-02-09 DIAGNOSIS — Z510 Encounter for antineoplastic radiation therapy: Secondary | ICD-10-CM | POA: Diagnosis not present

## 2022-02-09 DIAGNOSIS — K7460 Unspecified cirrhosis of liver: Secondary | ICD-10-CM | POA: Diagnosis not present

## 2022-02-09 DIAGNOSIS — C7951 Secondary malignant neoplasm of bone: Secondary | ICD-10-CM | POA: Diagnosis not present

## 2022-02-09 DIAGNOSIS — D696 Thrombocytopenia, unspecified: Secondary | ICD-10-CM | POA: Diagnosis not present

## 2022-02-09 LAB — RAD ONC ARIA SESSION SUMMARY
Course Elapsed Days: 8
Plan Fractions Treated to Date: 6
Plan Prescribed Dose Per Fraction: 3.5 Gy
Plan Total Fractions Prescribed: 8
Plan Total Prescribed Dose: 28 Gy
Reference Point Dosage Given to Date: 21 Gy
Reference Point Session Dosage Given: 3.5 Gy
Session Number: 6

## 2022-02-12 ENCOUNTER — Ambulatory Visit: Payer: BC Managed Care – PPO

## 2022-02-12 ENCOUNTER — Inpatient Hospital Stay: Payer: BC Managed Care – PPO

## 2022-02-12 ENCOUNTER — Other Ambulatory Visit: Payer: Self-pay

## 2022-02-12 ENCOUNTER — Ambulatory Visit
Admission: RE | Admit: 2022-02-12 | Discharge: 2022-02-12 | Disposition: A | Discharge status: Home / Self Care | Payer: BC Managed Care – PPO | Source: Ambulatory Visit | Attending: Radiation Oncology | Admitting: Radiation Oncology

## 2022-02-12 DIAGNOSIS — K7460 Unspecified cirrhosis of liver: Secondary | ICD-10-CM | POA: Diagnosis not present

## 2022-02-12 DIAGNOSIS — C61 Malignant neoplasm of prostate: Secondary | ICD-10-CM | POA: Diagnosis not present

## 2022-02-12 DIAGNOSIS — Z5111 Encounter for antineoplastic chemotherapy: Secondary | ICD-10-CM | POA: Diagnosis not present

## 2022-02-12 DIAGNOSIS — D649 Anemia, unspecified: Secondary | ICD-10-CM | POA: Diagnosis not present

## 2022-02-12 DIAGNOSIS — R161 Splenomegaly, not elsewhere classified: Secondary | ICD-10-CM | POA: Diagnosis not present

## 2022-02-12 DIAGNOSIS — C7951 Secondary malignant neoplasm of bone: Secondary | ICD-10-CM | POA: Diagnosis not present

## 2022-02-12 DIAGNOSIS — Z510 Encounter for antineoplastic radiation therapy: Secondary | ICD-10-CM | POA: Diagnosis not present

## 2022-02-12 DIAGNOSIS — D696 Thrombocytopenia, unspecified: Secondary | ICD-10-CM | POA: Diagnosis not present

## 2022-02-12 LAB — RAD ONC ARIA SESSION SUMMARY
Course Elapsed Days: 11
Plan Fractions Treated to Date: 7
Plan Prescribed Dose Per Fraction: 3.5 Gy
Plan Total Fractions Prescribed: 8
Plan Total Prescribed Dose: 28 Gy
Reference Point Dosage Given to Date: 24.5 Gy
Reference Point Session Dosage Given: 3.5 Gy
Session Number: 7

## 2022-02-13 ENCOUNTER — Ambulatory Visit: Payer: BC Managed Care – PPO

## 2022-02-13 ENCOUNTER — Other Ambulatory Visit: Payer: Self-pay | Admitting: Nurse Practitioner

## 2022-02-13 DIAGNOSIS — C61 Malignant neoplasm of prostate: Secondary | ICD-10-CM

## 2022-02-14 ENCOUNTER — Inpatient Hospital Stay: Payer: BC Managed Care – PPO | Attending: Nurse Practitioner

## 2022-02-14 ENCOUNTER — Other Ambulatory Visit: Payer: Self-pay | Admitting: Nurse Practitioner

## 2022-02-14 ENCOUNTER — Ambulatory Visit: Payer: BC Managed Care – PPO

## 2022-02-14 ENCOUNTER — Ambulatory Visit
Admission: RE | Admit: 2022-02-14 | Discharge: 2022-02-14 | Disposition: A | Discharge status: Home / Self Care | Payer: BC Managed Care – PPO | Source: Ambulatory Visit | Attending: Radiation Oncology | Admitting: Radiation Oncology

## 2022-02-14 ENCOUNTER — Encounter: Payer: Self-pay | Admitting: Radiation Oncology

## 2022-02-14 ENCOUNTER — Other Ambulatory Visit: Payer: Self-pay

## 2022-02-14 ENCOUNTER — Other Ambulatory Visit (HOSPITAL_BASED_OUTPATIENT_CLINIC_OR_DEPARTMENT_OTHER): Payer: Self-pay

## 2022-02-14 DIAGNOSIS — C61 Malignant neoplasm of prostate: Secondary | ICD-10-CM | POA: Insufficient documentation

## 2022-02-14 DIAGNOSIS — Z8619 Personal history of other infectious and parasitic diseases: Secondary | ICD-10-CM | POA: Insufficient documentation

## 2022-02-14 DIAGNOSIS — K7460 Unspecified cirrhosis of liver: Secondary | ICD-10-CM | POA: Diagnosis not present

## 2022-02-14 DIAGNOSIS — D649 Anemia, unspecified: Secondary | ICD-10-CM | POA: Insufficient documentation

## 2022-02-14 DIAGNOSIS — Z510 Encounter for antineoplastic radiation therapy: Secondary | ICD-10-CM | POA: Insufficient documentation

## 2022-02-14 DIAGNOSIS — C7951 Secondary malignant neoplasm of bone: Secondary | ICD-10-CM | POA: Insufficient documentation

## 2022-02-14 DIAGNOSIS — F17200 Nicotine dependence, unspecified, uncomplicated: Secondary | ICD-10-CM | POA: Insufficient documentation

## 2022-02-14 DIAGNOSIS — I2510 Atherosclerotic heart disease of native coronary artery without angina pectoris: Secondary | ICD-10-CM | POA: Insufficient documentation

## 2022-02-14 DIAGNOSIS — M25562 Pain in left knee: Secondary | ICD-10-CM | POA: Insufficient documentation

## 2022-02-14 DIAGNOSIS — Z5111 Encounter for antineoplastic chemotherapy: Secondary | ICD-10-CM | POA: Diagnosis not present

## 2022-02-14 DIAGNOSIS — F32A Depression, unspecified: Secondary | ICD-10-CM | POA: Insufficient documentation

## 2022-02-14 DIAGNOSIS — M25561 Pain in right knee: Secondary | ICD-10-CM | POA: Insufficient documentation

## 2022-02-14 DIAGNOSIS — D696 Thrombocytopenia, unspecified: Secondary | ICD-10-CM | POA: Diagnosis not present

## 2022-02-14 DIAGNOSIS — R161 Splenomegaly, not elsewhere classified: Secondary | ICD-10-CM | POA: Insufficient documentation

## 2022-02-14 DIAGNOSIS — R162 Hepatomegaly with splenomegaly, not elsewhere classified: Secondary | ICD-10-CM | POA: Insufficient documentation

## 2022-02-14 DIAGNOSIS — I252 Old myocardial infarction: Secondary | ICD-10-CM | POA: Insufficient documentation

## 2022-02-14 DIAGNOSIS — M1612 Unilateral primary osteoarthritis, left hip: Secondary | ICD-10-CM | POA: Insufficient documentation

## 2022-02-14 DIAGNOSIS — C220 Liver cell carcinoma: Secondary | ICD-10-CM | POA: Insufficient documentation

## 2022-02-14 LAB — RAD ONC ARIA SESSION SUMMARY
Course Elapsed Days: 13
Plan Fractions Treated to Date: 8
Plan Prescribed Dose Per Fraction: 3.5 Gy
Plan Total Fractions Prescribed: 8
Plan Total Prescribed Dose: 28 Gy
Reference Point Dosage Given to Date: 28 Gy
Reference Point Session Dosage Given: 3.5 Gy
Session Number: 8

## 2022-02-14 MED ORDER — OXYCODONE HCL 5 MG PO TABS
5.0000 mg | ORAL_TABLET | Freq: Four times a day (QID) | ORAL | 0 refills | Status: DC | PRN
  Filled 2022-02-14: qty 100, 9d supply, fill #0

## 2022-02-15 ENCOUNTER — Inpatient Hospital Stay (HOSPITAL_BASED_OUTPATIENT_CLINIC_OR_DEPARTMENT_OTHER): Payer: BC Managed Care – PPO | Admitting: Nurse Practitioner

## 2022-02-15 ENCOUNTER — Encounter: Payer: Self-pay | Admitting: *Deleted

## 2022-02-15 ENCOUNTER — Encounter: Payer: Self-pay | Admitting: Nutrition

## 2022-02-15 ENCOUNTER — Encounter: Payer: Self-pay | Admitting: Nurse Practitioner

## 2022-02-15 ENCOUNTER — Encounter: Payer: Self-pay | Admitting: Oncology

## 2022-02-15 VITALS — BP 136/71 | HR 97 | Temp 98.2°F | Resp 18 | Ht 62.0 in | Wt 144.6 lb

## 2022-02-15 DIAGNOSIS — M25562 Pain in left knee: Secondary | ICD-10-CM | POA: Diagnosis not present

## 2022-02-15 DIAGNOSIS — F32A Depression, unspecified: Secondary | ICD-10-CM | POA: Diagnosis not present

## 2022-02-15 DIAGNOSIS — I2510 Atherosclerotic heart disease of native coronary artery without angina pectoris: Secondary | ICD-10-CM | POA: Diagnosis not present

## 2022-02-15 DIAGNOSIS — Z8619 Personal history of other infectious and parasitic diseases: Secondary | ICD-10-CM | POA: Diagnosis not present

## 2022-02-15 DIAGNOSIS — R162 Hepatomegaly with splenomegaly, not elsewhere classified: Secondary | ICD-10-CM | POA: Diagnosis not present

## 2022-02-15 DIAGNOSIS — C7951 Secondary malignant neoplasm of bone: Secondary | ICD-10-CM | POA: Diagnosis not present

## 2022-02-15 DIAGNOSIS — M1612 Unilateral primary osteoarthritis, left hip: Secondary | ICD-10-CM | POA: Diagnosis not present

## 2022-02-15 DIAGNOSIS — I252 Old myocardial infarction: Secondary | ICD-10-CM | POA: Diagnosis not present

## 2022-02-15 DIAGNOSIS — K7460 Unspecified cirrhosis of liver: Secondary | ICD-10-CM | POA: Diagnosis not present

## 2022-02-15 DIAGNOSIS — M25561 Pain in right knee: Secondary | ICD-10-CM | POA: Diagnosis not present

## 2022-02-15 DIAGNOSIS — D696 Thrombocytopenia, unspecified: Secondary | ICD-10-CM | POA: Diagnosis not present

## 2022-02-15 DIAGNOSIS — F17200 Nicotine dependence, unspecified, uncomplicated: Secondary | ICD-10-CM | POA: Diagnosis not present

## 2022-02-15 DIAGNOSIS — C61 Malignant neoplasm of prostate: Secondary | ICD-10-CM | POA: Diagnosis not present

## 2022-02-15 DIAGNOSIS — C220 Liver cell carcinoma: Secondary | ICD-10-CM | POA: Diagnosis not present

## 2022-02-15 DIAGNOSIS — D649 Anemia, unspecified: Secondary | ICD-10-CM | POA: Diagnosis not present

## 2022-02-15 NOTE — Progress Notes (Signed)
Provided patient case of vanilla Ensure and bag of groceries today

## 2022-02-15 NOTE — Progress Notes (Signed)
[facility] OFFICE PROGRESS NOTE   Diagnosis: Prostate cancer, hepatocellular carcinoma  INTERVAL HISTORY:   Edgar Mooney returns as scheduled.  He completed palliative radiation yesterday.  Right hip/groin pain is slightly improved.  Main pain is lower back.  He continues oxycodone about every 4 hours with partial effectiveness.  He is not taking MS Contin.  Over the weekend he noted leg swelling.  The swelling is better today.  He woke up this morning with facial edema.  Objective:  Vital signs in last 24 hours:  Blood pressure 136/71, pulse 97, temperature 98.2 F (36.8 C), resp. rate 18, height _0  (1.575 m), weight 144 lb 9.6 oz (65.6 kg), SpO2 98 %.    HEENT: No thrush or ulcers.  Periorbital edema.  Mild facial edema. Lymphatics: No palpable cervical or supraclavicular lymph nodes. Resp: Lungs clear bilaterally. Cardio: Regular rate and rhythm. GI: Abdomen soft, distended.  No hepatosplenomegaly. Vascular: Trace pitting edema bilateral lower leg.  Prominent vein pattern upper chest and abdomen. Neuro: Alert and oriented. Skin: Ecchymoses scattered over the forearms.  Ecchymosis dorsal aspect right foot.   Lab Results:  Lab Results  Component Value Date   WBC 11.8 (H) 02/06/2022   HGB 9.6 (L) 02/06/2022   HCT 29.4 (L) 02/06/2022   MCV 96.7 02/06/2022   PLT 125 (L) 02/06/2022   NEUTROABS 9.9 (H) 02/06/2022    Imaging:  No results found.  Medications: I have reviewed the patient's current medications.  Assessment/Plan: Metastatic prostate cancer - Lytic bone lesions with retroperitoneal adenopathy concerning for metastatic prostate cancer -03/03/2020-CTA chest/abdomen/pelvis widespread osseous metastatic disease and lower retroperitoneal adenopathy, pathologic right anterior third rib fracture, probable spinal canal tumor at the level of S1, -MRI cervical, thoracic, and lumbar spine 03/03/2020-diffuse osseous metastatic disease, ventral epidural  tumor impinging on right S1 nerve root, extraosseous tumor at the bilateral ilium, asymmetric enhancing material at the right C5-6 canal-right  -03/03/2020-PSA 763 -Degarelix 03/04/2020 -Abiraterone/prednisone 03/14/2020 -Every 95-monthLupron 04/01/2020 -04/01/2020 PSA 36.5 -05/31/2020 PSA 1.2 -06/28/2020 PSA 1.0 -12/14/2020 PSA 6.4 -01/13/2021 PSA 10.6 -04/26/2021 PSA  45.2 -05/16/2021 PSA 70 -Abiraterone/prednisone discontinued 05/16/2021 -05/16/2021 left hip x-ray-multifocal sclerotic metastasis throughout the pelvis, confluent involving the left acetabulum.  No acute or pathologic fracture.  Left hip osteoarthritis. -Cycle 1 docetaxel 05/31/2021 -05/31/2021 PSA 113 -Palliative radiation to the left acetabulum 06/08/2021 - 06/20/2021 -Cycle 2 docetaxel 06/21/2021 -06/21/2021 PSA improved at 68 -Cycle 3 docetaxel 07/12/2021 -07/12/2021 PSA improved at 52 -Cycle 4 docetaxel 08/02/2021 -Cycle 5 docetaxel 08/22/2021 -CT chest 09/16/2021-negative for pulmonary embolism, right liver mass, extensive bone metastases -Cycle 6 docetaxel 09/28/2021 -PSA increased 09/28/2021 -Olaparib 10/24/2021, patient decreased dose to 1 tablet twice daily on 10/31/2021 due to muscle spasms; dose resumed at prescribed amount 11/06/2021 -PSA increased 11/24/2021 -PSA further increased 12/08/2021 -Olaparib discontinued -PET PSMA scan 01/11/2022-widespread intensely radiotracer avid sclerotic skeletal metastasis involving the axillary and appendicular skeleton.  Small-volume periaortic retroperitoneal radiotracer avid prostate cancer nodal metastasis.  Nonspecific activity in the prostate gland.  No liver or lung prostate cancer metastasis. -Palliative radiation to the right pelvis and proximal right femur 02/01/2022   2.  Severe anemia-likely secondary to metastatic prostate cancer involving the bones 3.  Mild thrombocytopenia 4.  Cirrhosis with splenomegaly 5.  History of hepatitis C 6.  History of polysubstance abuse 7.   Depression 8.  Tobacco dependence 9.  Right arm weakness, right facial numbness-potentially related to nerve root compromise from metastatic bone lesions 10.  Pain secondary to #  1 11.  Extensive bone metastases-every 38-monthZometa starting 04/01/2020 12.  Gout-acute flare right wrist 04/01/2020 treated with indomethacin, allopurinol resumed 13.  Right hepatic lobe mass with elevated AFP [facility]-hypoenhancing inferior right liver mass, segment 6, occluding or compressing the adjacent right portal vein branch, intrahepatic cholangiocarcinoma favored with differential including hepatocellular carcinoma, Li-Rads M Y 90 planning study 11-22 Y90 right lobe of liver 06/26/2021 14.  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  [facility]      Disposition: Mr. MStinemanappears stable.  He completed the course of palliative radiation to right pelvis and right femur yesterday.  He is taking oxycodone every 4 hours with partial relief.  We reviewed dosing instructions for MS Contin.  He has been scheduled to begin Pluvicto 03/14/2022.  He will return here for baseline labs and a follow-up visit on 03/05/2022.  We are available to see him sooner if needed.  Patient seen with Dr. SBenay Spice    Edgar CardANP/GNP-BC   02/15/2022  12:27 PM This was a shared visit with Edgar Mooney  Mr. MWymorewas interviewed and examined.  He presents today with swelling in the face and feet.  The swelling may be related to Decadron and cirrhosis.  I have a low clinical suspicion for SVC syndrome.  He continues narcotic analgesics for diffuse bone pain.  He is scheduled to receive Pluvicto in a few weeks.   Systemic options are limited.  We can consider cabazitaxel or radium 223, but this may delay the schedule Pluvicto therapy.  I was present for greater than 50% of today's visit.  I performed medical decision making.  BJulieanne Manson MD

## 2022-02-15 NOTE — Telephone Encounter (Signed)
Ensure and grocery was given to the patient

## 2022-02-15 NOTE — Progress Notes (Signed)
Patient was given one case of ensure plus HP today.

## 2022-02-19 ENCOUNTER — Ambulatory Visit: Payer: BC Managed Care – PPO

## 2022-02-19 NOTE — Progress Notes (Signed)
Nutrition Assessment   Reason for Assessment:   Patient identified on Malnutrition Screening report for weight loss and poor appetite.     ASSESSMENT: 56 year old male with prostate cancer and hepatocellular carcinoma.  Past medical history of cirrhosis, hepatitis C, polysubstance abuse, depression.  Patient completed radiation to right pelvis and femur.  Planning to start pluvicto.  Spoke with patient via phone for nutrition assessment.  Patient reports that his appetite is doing better.  Says that dysphagia is a little bit better.  Mostly eating lunch and supper.  Drinking 2-3 ensure shakes a day.  Yesterday ate hamburger and baked beans for supper without difficulty.  Denies nausea.  Says that he is taking stool softner for constipation.  Denies concerns about nutrition    Medications: dexamethasone, colace, diflucan, zofran, miralax, compazine   Labs: reviewed   Anthropometrics:   Height: 62 inches Weight: 144 lb 9.6 oz (swelling noted per chart) 158 lb on 5/9 BMI: 26  8% weight loss in the last 5 months, concerning   Estimated Energy Needs  Kcals: 1625-1950 Protein: 81-98 g Fluid: 1625-1950 ml   NUTRITION DIAGNOSIS: Unintentional weight loss related to cancer and cancer related treatment side effects as evidenced by 8% weight loss in the last 5 months.     INTERVENTION:  Encouraged good sources of protein at every meal/snack.  Discussed examples of protein foods Encouraged oral nutrition supplements 2-3 times per day.   Encouraged fluids and bowel regimen to help with constipation.  Offered contact information but patient declined at this time.  Agreeable to follow-up phone call   MONITORING, EVALUATION, GOAL: weight trends, intake   Next Visit: phone call in ~ 4 weeks  Kaimana Lurz B. Zenia Resides, [place], [place] Registered Dietitian [phone]

## 2022-02-27 ENCOUNTER — Other Ambulatory Visit: Payer: Self-pay | Admitting: Nurse Practitioner

## 2022-02-27 ENCOUNTER — Telehealth: Payer: Self-pay

## 2022-02-27 ENCOUNTER — Other Ambulatory Visit (HOSPITAL_BASED_OUTPATIENT_CLINIC_OR_DEPARTMENT_OTHER): Payer: Self-pay

## 2022-02-27 DIAGNOSIS — C61 Malignant neoplasm of prostate: Secondary | ICD-10-CM

## 2022-02-27 MED ORDER — OXYCODONE HCL 5 MG PO TABS
5.0000 mg | ORAL_TABLET | Freq: Four times a day (QID) | ORAL | 0 refills | Status: DC | PRN
  Filled 2022-02-27: qty 100, 9d supply, fill #0

## 2022-02-27 NOTE — Telephone Encounter (Signed)
Mr. Noga  called in for a refill of his OxyCodone, placed on the Np's desk

## 2022-03-05 ENCOUNTER — Other Ambulatory Visit: Payer: Self-pay | Admitting: Nurse Practitioner

## 2022-03-05 ENCOUNTER — Inpatient Hospital Stay: Payer: BC Managed Care – PPO

## 2022-03-05 ENCOUNTER — Inpatient Hospital Stay: Payer: BC Managed Care – PPO | Admitting: Nurse Practitioner

## 2022-03-05 ENCOUNTER — Other Ambulatory Visit (HOSPITAL_BASED_OUTPATIENT_CLINIC_OR_DEPARTMENT_OTHER): Payer: Self-pay

## 2022-03-05 DIAGNOSIS — D649 Anemia, unspecified: Secondary | ICD-10-CM | POA: Diagnosis not present

## 2022-03-05 DIAGNOSIS — C220 Liver cell carcinoma: Secondary | ICD-10-CM | POA: Diagnosis not present

## 2022-03-05 DIAGNOSIS — M1612 Unilateral primary osteoarthritis, left hip: Secondary | ICD-10-CM | POA: Diagnosis not present

## 2022-03-05 DIAGNOSIS — R162 Hepatomegaly with splenomegaly, not elsewhere classified: Secondary | ICD-10-CM | POA: Diagnosis not present

## 2022-03-05 DIAGNOSIS — C7951 Secondary malignant neoplasm of bone: Secondary | ICD-10-CM | POA: Diagnosis not present

## 2022-03-05 DIAGNOSIS — C61 Malignant neoplasm of prostate: Secondary | ICD-10-CM

## 2022-03-05 DIAGNOSIS — I252 Old myocardial infarction: Secondary | ICD-10-CM | POA: Diagnosis not present

## 2022-03-05 DIAGNOSIS — K7460 Unspecified cirrhosis of liver: Secondary | ICD-10-CM | POA: Diagnosis not present

## 2022-03-05 DIAGNOSIS — F32A Depression, unspecified: Secondary | ICD-10-CM | POA: Diagnosis not present

## 2022-03-05 DIAGNOSIS — F17200 Nicotine dependence, unspecified, uncomplicated: Secondary | ICD-10-CM | POA: Diagnosis not present

## 2022-03-05 DIAGNOSIS — M25561 Pain in right knee: Secondary | ICD-10-CM | POA: Diagnosis not present

## 2022-03-05 DIAGNOSIS — I2510 Atherosclerotic heart disease of native coronary artery without angina pectoris: Secondary | ICD-10-CM | POA: Diagnosis not present

## 2022-03-05 DIAGNOSIS — D696 Thrombocytopenia, unspecified: Secondary | ICD-10-CM | POA: Diagnosis not present

## 2022-03-05 DIAGNOSIS — Z8619 Personal history of other infectious and parasitic diseases: Secondary | ICD-10-CM | POA: Diagnosis not present

## 2022-03-05 DIAGNOSIS — M25562 Pain in left knee: Secondary | ICD-10-CM | POA: Diagnosis not present

## 2022-03-05 LAB — CMP (CANCER CENTER ONLY)
ALT: 5 U/L (ref 0–44)
AST: 25 U/L (ref 15–41)
Albumin: 3.8 g/dL (ref 3.5–5.0)
Alkaline Phosphatase: 406 U/L — ABNORMAL HIGH (ref 38–126)
Anion gap: 15 (ref 5–15)
BUN: 16 mg/dL (ref 6–20)
CO2: 15 mmol/L — ABNORMAL LOW (ref 22–32)
Calcium: 8.5 mg/dL — ABNORMAL LOW (ref 8.9–10.3)
Chloride: 108 mmol/L (ref 98–111)
Creatinine: 0.67 mg/dL (ref 0.61–1.24)
GFR, Estimated: >60 mL/min (ref >60)
Glucose, Bld: 111 mg/dL — ABNORMAL HIGH (ref 70–99)
Potassium: 4.1 mmol/L (ref 3.5–5.1)
Sodium: 138 mmol/L (ref 135–145)
Total Bilirubin: 1 mg/dL (ref 0.3–1.2)
Total Protein: 6.9 g/dL (ref 6.5–8.1)

## 2022-03-05 LAB — CBC WITH DIFFERENTIAL (CANCER CENTER ONLY)
Abs Immature Granulocytes: 0.1 10*3/uL — ABNORMAL HIGH (ref 0.00–0.07)
Band Neutrophils: 1 %
Basophils Absolute: 0.1 10*3/uL (ref 0.0–0.1)
Basophils Relative: 1 %
Eosinophils Absolute: 0.1 10*3/uL (ref 0.0–0.5)
Eosinophils Relative: 2 %
HCT: 23.6 % — ABNORMAL LOW (ref 39.0–52.0)
Hemoglobin: 7.4 g/dL — ABNORMAL LOW (ref 13.0–17.0)
Lymphocytes Relative: 12 %
Lymphs Abs: 0.7 10*3/uL (ref 0.7–4.0)
MCH: 30.5 pg (ref 26.0–34.0)
MCHC: 31.4 g/dL (ref 30.0–36.0)
MCV: 97.1 fL (ref 80.0–100.0)
Monocytes Absolute: 0.3 10*3/uL (ref 0.1–1.0)
Monocytes Relative: 5 %
Myelocytes: 1 %
Neutro Abs: 4.7 10*3/uL (ref 1.7–7.7)
Neutrophils Relative %: 78 %
Platelet Count: 69 10*3/uL — ABNORMAL LOW (ref 150–400)
RBC: 2.43 MIL/uL — ABNORMAL LOW (ref 4.22–5.81)
RDW: 19.2 % — ABNORMAL HIGH (ref 11.5–15.5)
WBC Count: 5.9 10*3/uL (ref 4.0–10.5)
nRBC: 3.6 % — ABNORMAL HIGH (ref 0.0–0.2)

## 2022-03-05 MED ORDER — OXYCODONE HCL 5 MG PO TABS
5.0000 mg | ORAL_TABLET | Freq: Four times a day (QID) | ORAL | 0 refills | Status: DC | PRN
  Filled 2022-03-05 – 2022-03-06 (×2): qty 100, 9d supply, fill #0

## 2022-03-06 ENCOUNTER — Other Ambulatory Visit (HOSPITAL_BASED_OUTPATIENT_CLINIC_OR_DEPARTMENT_OTHER): Payer: Self-pay

## 2022-03-07 ENCOUNTER — Other Ambulatory Visit: Payer: Self-pay | Admitting: Nurse Practitioner

## 2022-03-07 DIAGNOSIS — C61 Malignant neoplasm of prostate: Secondary | ICD-10-CM

## 2022-03-07 LAB — PROSTATE-SPECIFIC AG, SERUM (LABCORP): Prostate Specific Ag, Serum: 1707 ng/mL — ABNORMAL HIGH (ref 0.0–4.0)

## 2022-03-12 ENCOUNTER — Other Ambulatory Visit (HOSPITAL_BASED_OUTPATIENT_CLINIC_OR_DEPARTMENT_OTHER): Payer: Self-pay

## 2022-03-12 ENCOUNTER — Other Ambulatory Visit: Payer: Self-pay

## 2022-03-12 ENCOUNTER — Encounter: Payer: Self-pay | Admitting: Nurse Practitioner

## 2022-03-12 ENCOUNTER — Inpatient Hospital Stay: Payer: BC Managed Care – PPO

## 2022-03-12 ENCOUNTER — Other Ambulatory Visit: Payer: Self-pay | Admitting: Oncology

## 2022-03-12 ENCOUNTER — Inpatient Hospital Stay (HOSPITAL_BASED_OUTPATIENT_CLINIC_OR_DEPARTMENT_OTHER): Payer: BC Managed Care – PPO | Admitting: Nurse Practitioner

## 2022-03-12 ENCOUNTER — Telehealth: Payer: Self-pay

## 2022-03-12 VITALS — BP 130/73 | HR 100 | Temp 98.2°F | Resp 20 | Ht 62.0 in | Wt 124.0 lb

## 2022-03-12 DIAGNOSIS — F17200 Nicotine dependence, unspecified, uncomplicated: Secondary | ICD-10-CM | POA: Diagnosis not present

## 2022-03-12 DIAGNOSIS — M25561 Pain in right knee: Secondary | ICD-10-CM | POA: Diagnosis not present

## 2022-03-12 DIAGNOSIS — R162 Hepatomegaly with splenomegaly, not elsewhere classified: Secondary | ICD-10-CM | POA: Diagnosis not present

## 2022-03-12 DIAGNOSIS — K7460 Unspecified cirrhosis of liver: Secondary | ICD-10-CM | POA: Diagnosis not present

## 2022-03-12 DIAGNOSIS — C61 Malignant neoplasm of prostate: Secondary | ICD-10-CM

## 2022-03-12 DIAGNOSIS — C7951 Secondary malignant neoplasm of bone: Secondary | ICD-10-CM | POA: Diagnosis not present

## 2022-03-12 DIAGNOSIS — D696 Thrombocytopenia, unspecified: Secondary | ICD-10-CM | POA: Diagnosis not present

## 2022-03-12 DIAGNOSIS — M1612 Unilateral primary osteoarthritis, left hip: Secondary | ICD-10-CM | POA: Diagnosis not present

## 2022-03-12 DIAGNOSIS — D649 Anemia, unspecified: Secondary | ICD-10-CM | POA: Diagnosis not present

## 2022-03-12 DIAGNOSIS — F32A Depression, unspecified: Secondary | ICD-10-CM | POA: Diagnosis not present

## 2022-03-12 DIAGNOSIS — I252 Old myocardial infarction: Secondary | ICD-10-CM | POA: Diagnosis not present

## 2022-03-12 DIAGNOSIS — M25562 Pain in left knee: Secondary | ICD-10-CM | POA: Diagnosis not present

## 2022-03-12 DIAGNOSIS — I2510 Atherosclerotic heart disease of native coronary artery without angina pectoris: Secondary | ICD-10-CM | POA: Diagnosis not present

## 2022-03-12 DIAGNOSIS — C220 Liver cell carcinoma: Secondary | ICD-10-CM | POA: Diagnosis not present

## 2022-03-12 DIAGNOSIS — Z8619 Personal history of other infectious and parasitic diseases: Secondary | ICD-10-CM | POA: Diagnosis not present

## 2022-03-12 LAB — CBC WITH DIFFERENTIAL (CANCER CENTER ONLY)
Abs Immature Granulocytes: 0.2 10*3/uL — ABNORMAL HIGH (ref 0.00–0.07)
Band Neutrophils: 3 %
Basophils Absolute: 0.1 10*3/uL (ref 0.0–0.1)
Basophils Relative: 2 %
Eosinophils Absolute: 0.2 10*3/uL (ref 0.0–0.5)
Eosinophils Relative: 3 %
HCT: 22.8 % — ABNORMAL LOW (ref 39.0–52.0)
Hemoglobin: 7.2 g/dL — ABNORMAL LOW (ref 13.0–17.0)
Lymphocytes Relative: 14 %
Lymphs Abs: 0.8 10*3/uL (ref 0.7–4.0)
MCH: 30.5 pg (ref 26.0–34.0)
MCHC: 31.6 g/dL (ref 30.0–36.0)
MCV: 96.6 fL (ref 80.0–100.0)
Monocytes Absolute: 0.3 10*3/uL (ref 0.1–1.0)
Monocytes Relative: 5 %
Myelocytes: 3 %
Neutro Abs: 4.2 10*3/uL (ref 1.7–7.7)
Neutrophils Relative %: 70 %
Platelet Count: 78 10*3/uL — ABNORMAL LOW (ref 150–400)
RBC: 2.36 MIL/uL — ABNORMAL LOW (ref 4.22–5.81)
RDW: 18.8 % — ABNORMAL HIGH (ref 11.5–15.5)
WBC Count: 5.8 10*3/uL (ref 4.0–10.5)
nRBC: 2.2 % — ABNORMAL HIGH (ref 0.0–0.2)

## 2022-03-12 LAB — SAMPLE TO BLOOD BANK

## 2022-03-12 MED ORDER — OXYCODONE HCL 5 MG PO TABS
5.0000 mg | ORAL_TABLET | ORAL | 0 refills | Status: DC | PRN
  Filled 2022-03-12: qty 100, 6d supply, fill #0

## 2022-03-12 MED ORDER — DEXAMETHASONE 4 MG PO TABS
4.0000 mg | ORAL_TABLET | Freq: Every day | ORAL | 1 refills | Status: DC
  Filled 2022-03-12: qty 30, 30d supply, fill #0

## 2022-03-12 MED ORDER — OXYCONTIN 60 MG PO T12A
60.0000 mg | EXTENDED_RELEASE_TABLET | Freq: Two times a day (BID) | ORAL | 0 refills | Status: DC
  Filled 2022-03-12: qty 60, 30d supply, fill #0

## 2022-03-12 NOTE — Progress Notes (Signed)
[facility] OFFICE PROGRESS NOTE   Diagnosis: Prostate cancer, hepatocellular carcinoma  INTERVAL HISTORY:   Mr. Milke returns as scheduled.  He is due to begin treatment with Pluvicto 03/14/2022.  He reports severe pain in his knees and spine.  He is taking 3 oxycodone 5 mg tablets every 3-4 hours.  He notes relief after taking oxycodone.  He ran out of MS Contin several days ago.  Bowels are moving.  No bladder dysfunction.  No leg weakness or numbness.  Objective:  Vital signs in last 24 hours:  Blood pressure 130/73, pulse 100, temperature 98.2 F (36.8 C), temperature source Oral, resp. rate 20, height _0  (1.575 m), weight 124 lb (56.2 kg), SpO2 100 %.    HEENT: Thick white coating over tongue. Resp: Lungs clear bilaterally. Cardio: Regular rate and rhythm. GI: Abdomen soft and nontender. Vascular: No leg edema. Neuro: Lower extremity motor strength is intact.   Lab Results:  Lab Results  Component Value Date   WBC 5.8 03/12/2022   HGB 7.2 (L) 03/12/2022   HCT 22.8 (L) 03/12/2022   MCV 96.6 03/12/2022   PLT 78 (L) 03/12/2022   NEUTROABS PENDING 03/12/2022    Imaging:  No results found.  Medications: I have reviewed the patient's current medications.  Assessment/Plan: Metastatic prostate cancer - Lytic bone lesions with retroperitoneal adenopathy concerning for metastatic prostate cancer -03/03/2020-CTA chest/abdomen/pelvis widespread osseous metastatic disease and lower retroperitoneal adenopathy, pathologic right anterior third rib fracture, probable spinal canal tumor at the level of S1, -MRI cervical, thoracic, and lumbar spine 03/03/2020-diffuse osseous metastatic disease, ventral epidural tumor impinging on right S1 nerve root, extraosseous tumor at the bilateral ilium, asymmetric enhancing material at the right C5-6 canal-right  -03/03/2020-PSA 763 -Degarelix 03/04/2020 -Abiraterone/prednisone 03/14/2020 -Every 84-monthLupron  04/01/2020 -04/01/2020 PSA 36.5 -05/31/2020 PSA 1.2 -06/28/2020 PSA 1.0 -12/14/2020 PSA 6.4 -01/13/2021 PSA 10.6 -04/26/2021 PSA  45.2 -05/16/2021 PSA 70 -Abiraterone/prednisone discontinued 05/16/2021 -05/16/2021 left hip x-ray-multifocal sclerotic metastasis throughout the pelvis, confluent involving the left acetabulum.  No acute or pathologic fracture.  Left hip osteoarthritis. -Cycle 1 docetaxel 05/31/2021 -05/31/2021 PSA 113 -Palliative radiation to the left acetabulum 06/08/2021 - 06/20/2021 -Cycle 2 docetaxel 06/21/2021 -06/21/2021 PSA improved at 68 -Cycle 3 docetaxel 07/12/2021 -07/12/2021 PSA improved at 52 -Cycle 4 docetaxel 08/02/2021 -Cycle 5 docetaxel 08/22/2021 -CT chest 09/16/2021-negative for pulmonary embolism, right liver mass, extensive bone metastases -Cycle 6 docetaxel 09/28/2021 -PSA increased 09/28/2021 -Olaparib 10/24/2021, patient decreased dose to 1 tablet twice daily on 10/31/2021 due to muscle spasms; dose resumed at prescribed amount 11/06/2021 -PSA increased 11/24/2021 -PSA further increased 12/08/2021 -Olaparib discontinued -PET PSMA scan 01/11/2022-widespread intensely radiotracer avid sclerotic skeletal metastasis involving the axillary and appendicular skeleton.  Small-volume periaortic retroperitoneal radiotracer avid prostate cancer nodal metastasis.  Nonspecific activity in the prostate gland.  No liver or lung prostate cancer metastasis. -Palliative radiation to the right pelvis and proximal right femur 02/01/2022 -Pluvicto anticipated to begin 03/14/2022   2.  Severe anemia-likely secondary to metastatic prostate cancer involving the bones 3.  Mild thrombocytopenia 4.  Cirrhosis with splenomegaly 5.  History of hepatitis C 6.  History of polysubstance abuse 7.  Depression 8.  Tobacco dependence 9.  Right arm weakness, right facial numbness-potentially related to nerve root compromise from metastatic bone lesions 10.  Pain secondary to #1 11.  Extensive bone  metastases-every 33-monthometa starting 04/01/2020 12.  Gout-acute flare right wrist 04/01/2020 treated with indomethacin, allopurinol resumed 13.  Right hepatic lobe mass with  elevated AFP concerning for [facility] MRI abdomen 12/21/2020-hypoenhancing inferior right liver mass, segment 6, occluding or compressing the adjacent right portal vein branch, intrahepatic cholangiocarcinoma favored with differential including hepatocellular carcinoma, Li-Rads M Y 90 planning study 11-22 Y90 right lobe of liver 06/26/2021 14.  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  Presentation to the [facility] emergency room 09/16/2021 with chest pain-transfer to [facility] on a morphine drip and scheduled Ativan    Disposition: Mr. Shartzer has metastatic prostate cancer.  He is scheduled to begin treatment with Pluvicto 03/14/2022.  He continues to have significant pain in multiple locations.  We adjusted the pain medication regimen today-oxycodone 5 to 15 mg every 4 hours as needed (previously every 6 hours as needed), MS Contin increased to 60 mg every 12 hours.  He will resume dexamethasone 4 mg daily.  CBC from today shows progressive anemia.  We are making arrangements for a blood transfusion.  He will return for lab and follow-up in 1 week.  We are available to see him sooner if needed.  Patient seen with Dr. Benay Spice.    Ned Card ANP/GNP-BC   03/12/2022  1:34 PM This was a shared visit with Ned Card.  Mr. Haynesworth was interviewed and examined.  He has increased pain and weight loss secondary to progression of metastatic prostate cancer.  He is scheduled to begin salvage therapy with Pluvicto later this week.  We adjust the narcotic pain regimen.  He will resume Decadron.  He will return for an office visit in 1 week.  I was present for  greater than 50% of today's visit.  I performed medical decision making.  Julieanne Manson, MD

## 2022-03-12 NOTE — Telephone Encounter (Signed)
Called [facility] lab and spoke [place], she confirm order is in, dash has been call. Patient is aware of his  pick up time 0750.

## 2022-03-13 ENCOUNTER — Telehealth: Payer: Self-pay

## 2022-03-13 ENCOUNTER — Encounter: Payer: Self-pay | Admitting: Radiation Oncology

## 2022-03-13 ENCOUNTER — Other Ambulatory Visit (HOSPITAL_BASED_OUTPATIENT_CLINIC_OR_DEPARTMENT_OTHER): Payer: Self-pay

## 2022-03-13 ENCOUNTER — Inpatient Hospital Stay: Payer: BC Managed Care – PPO

## 2022-03-13 ENCOUNTER — Other Ambulatory Visit: Payer: Self-pay | Admitting: Nurse Practitioner

## 2022-03-13 DIAGNOSIS — C61 Malignant neoplasm of prostate: Secondary | ICD-10-CM

## 2022-03-13 LAB — PREPARE RBC (CROSSMATCH)

## 2022-03-13 MED ORDER — MORPHINE SULFATE ER 60 MG PO TBCR
60.0000 mg | EXTENDED_RELEASE_TABLET | Freq: Two times a day (BID) | ORAL | 0 refills | Status: DC
  Filled 2022-03-13: qty 60, 30d supply, fill #0

## 2022-03-13 NOTE — Written Directive (Cosign Needed)
  PLUVICTO  THERAPY   RADIOPHARMACEUTICAL: Lutetium 177 vipivotide tetraxetan (Pluvicto)     PRESCRIBED DOSE FOR ADMINISTRATION:  200 mCi   ROUTE OFADMINISTRATION:  IV   DIAGNOSIS:  Metastatic Prostate Cancer   REFERRING PHYSICIAN: Ned Card , NP   TREATMENT #: 1   ADDITIONAL PHYSICIAN COMMENTS/NOTES:   AUTHORIZED USER SIGNATURE & TIME STAMP: Rennis Golden, MD   03/14/22    9:21 AM

## 2022-03-13 NOTE — Telephone Encounter (Signed)
Patient did not show for blood transfusion appointment this AM.  Patient was called to see if he was on his way to the [facility].  Per patient, a friend had an emergency come up last night and he is not able to come in today for his blood transfusion.  He has asked to come in later this week for the blood transfusion.  Ned Card, NP notified, no new orders at this time.  Scheduling message sent to scheduler. Blood bank was notified and Dash courier set up to take blood back to blood bank.

## 2022-03-14 ENCOUNTER — Inpatient Hospital Stay: Payer: BC Managed Care – PPO

## 2022-03-14 ENCOUNTER — Other Ambulatory Visit (HOSPITAL_BASED_OUTPATIENT_CLINIC_OR_DEPARTMENT_OTHER): Payer: Self-pay

## 2022-03-14 ENCOUNTER — Inpatient Hospital Stay (HOSPITAL_COMMUNITY)
Admission: RE | Admit: 2022-03-14 | Discharge: 2022-03-14 | Disposition: A | Discharge status: Home / Self Care | Payer: BC Managed Care – PPO | Source: Ambulatory Visit | Attending: Nurse Practitioner | Admitting: Nurse Practitioner

## 2022-03-14 ENCOUNTER — Ambulatory Visit (HOSPITAL_COMMUNITY)
Admission: RE | Admit: 2022-03-14 | Discharge: 2022-03-14 | Disposition: A | Discharge status: Home / Self Care | Payer: BC Managed Care – PPO | Source: Ambulatory Visit | Attending: Nurse Practitioner | Admitting: Nurse Practitioner

## 2022-03-14 ENCOUNTER — Telehealth: Payer: Self-pay

## 2022-03-14 ENCOUNTER — Other Ambulatory Visit: Payer: Self-pay

## 2022-03-14 DIAGNOSIS — C61 Malignant neoplasm of prostate: Secondary | ICD-10-CM | POA: Diagnosis not present

## 2022-03-14 DIAGNOSIS — C7951 Secondary malignant neoplasm of bone: Secondary | ICD-10-CM | POA: Insufficient documentation

## 2022-03-14 LAB — COMPREHENSIVE METABOLIC PANEL
ALT: 8 U/L (ref 0–44)
AST: 31 U/L (ref 15–41)
Albumin: 3 g/dL — ABNORMAL LOW (ref 3.5–5.0)
Alkaline Phosphatase: 282 U/L — ABNORMAL HIGH (ref 38–126)
Anion gap: 12 (ref 5–15)
BUN: 22 mg/dL — ABNORMAL HIGH (ref 6–20)
CO2: 17 mmol/L — ABNORMAL LOW (ref 22–32)
Calcium: 8.3 mg/dL — ABNORMAL LOW (ref 8.9–10.3)
Chloride: 104 mmol/L (ref 98–111)
Creatinine, Ser: 0.8 mg/dL (ref 0.61–1.24)
GFR, Estimated: >60 mL/min (ref >60)
Glucose, Bld: 117 mg/dL — ABNORMAL HIGH (ref 70–99)
Potassium: 4.1 mmol/L (ref 3.5–5.1)
Sodium: 133 mmol/L — ABNORMAL LOW (ref 135–145)
Total Bilirubin: 1.7 mg/dL — ABNORMAL HIGH (ref 0.3–1.2)
Total Protein: 6.4 g/dL — ABNORMAL LOW (ref 6.5–8.1)

## 2022-03-14 LAB — CBC WITH DIFFERENTIAL (CANCER CENTER ONLY)
Abs Immature Granulocytes: 0.3 10*3/uL — ABNORMAL HIGH (ref 0.00–0.07)
Band Neutrophils: 1 %
Basophils Absolute: 0 10*3/uL (ref 0.0–0.1)
Basophils Relative: 0 %
Eosinophils Absolute: 0 10*3/uL (ref 0.0–0.5)
Eosinophils Relative: 0 %
HCT: 21.1 % — ABNORMAL LOW (ref 39.0–52.0)
Hemoglobin: 6.9 g/dL — CL (ref 13.0–17.0)
Lymphocytes Relative: 4 %
Lymphs Abs: 0.3 10*3/uL — ABNORMAL LOW (ref 0.7–4.0)
MCH: 30 pg (ref 26.0–34.0)
MCHC: 32.7 g/dL (ref 30.0–36.0)
MCV: 91.7 fL (ref 80.0–100.0)
Monocytes Absolute: 0.5 10*3/uL (ref 0.1–1.0)
Monocytes Relative: 8 %
Myelocytes: 4 %
Neutro Abs: 5.3 10*3/uL (ref 1.7–7.7)
Neutrophils Relative %: 83 %
Platelet Count: 59 10*3/uL — ABNORMAL LOW (ref 150–400)
RBC: 2.3 MIL/uL — ABNORMAL LOW (ref 4.22–5.81)
RDW: 17.3 % — ABNORMAL HIGH (ref 11.5–15.5)
Smear Review: NORMAL
WBC Count: 6.3 10*3/uL (ref 4.0–10.5)
nRBC: 1.3 % — ABNORMAL HIGH (ref 0.0–0.2)

## 2022-03-14 LAB — CBC WITH DIFFERENTIAL/PLATELET
Abs Immature Granulocytes: 0.39 10*3/uL — ABNORMAL HIGH (ref 0.00–0.07)
Basophils Absolute: 0 10*3/uL (ref 0.0–0.1)
Basophils Relative: 1 %
Eosinophils Absolute: 0 10*3/uL (ref 0.0–0.5)
Eosinophils Relative: 0 %
HCT: 23 % — ABNORMAL LOW (ref 39.0–52.0)
Hemoglobin: 7.5 g/dL — ABNORMAL LOW (ref 13.0–17.0)
Immature Granulocytes: 7 %
Lymphocytes Relative: 7 %
Lymphs Abs: 0.4 10*3/uL — ABNORMAL LOW (ref 0.7–4.0)
MCH: 30.1 pg (ref 26.0–34.0)
MCHC: 32.6 g/dL (ref 30.0–36.0)
MCV: 92.4 fL (ref 80.0–100.0)
Monocytes Absolute: 0.9 10*3/uL (ref 0.1–1.0)
Monocytes Relative: 17 %
Neutro Abs: 3.8 10*3/uL (ref 1.7–7.7)
Neutrophils Relative %: 68 %
Platelets: 62 10*3/uL — ABNORMAL LOW (ref 150–400)
RBC: 2.49 MIL/uL — ABNORMAL LOW (ref 4.22–5.81)
RDW: 17.7 % — ABNORMAL HIGH (ref 11.5–15.5)
WBC: 5.6 10*3/uL (ref 4.0–10.5)
nRBC: 1.1 % — ABNORMAL HIGH (ref 0.0–0.2)

## 2022-03-14 MED ORDER — SODIUM CHLORIDE 0.9% IV SOLUTION
250.0000 mL | Freq: Once | INTRAVENOUS | Status: AC
  Administered 2022-03-14: 250 mL via INTRAVENOUS

## 2022-03-14 NOTE — Telephone Encounter (Signed)
CRITICAL VALUE STICKER  CRITICAL VALUE: 6.9 Post Blood transfusion on 03/14/22  RECEIVER (on-site recipient of call):  Eldrige Pitkin P.  LPN  DATE & TIME NOTIFIED: 11/29 12:46 pm   MESSENGER (representative from lab): Lauren   MD NOTIFIED: Dr. Benay Spice

## 2022-03-14 NOTE — Patient Instructions (Signed)
Blood Transfusion, Adult A blood transfusion is a procedure in which you receive blood or a type of blood cell (blood component) through an IV. You may need a blood transfusion when you have a low blood count, which is a low number of any blood cell. This may result from a bleeding disorder, illness, injury, or surgery. The blood may come from a donor, or you may be able to have your own blood collected and stored (autologous blood donation) before a planned surgery. The blood given in a transfusion may be made up of different blood components. You may receive: Red blood cells. These carry oxygen to the cells in the body. Platelets. These help your blood to clot. Plasma. This is the liquid part of your blood. It carries proteins and other substances throughout the body. White blood cells. These help you fight infections. If you have hemophilia or another clotting disorder, you may also receive other types of blood products. Depending on the type of blood product, this procedure may take 1-4 hours to complete. Tell a health care provider about: Any bleeding problems you have. Any previous reactions you have had during a blood transfusion. Any allergies you have. All medicines you are taking, including vitamins, herbs, eye drops, creams, and over-the-counter medicines. Any surgeries you have had. Any medical conditions you have. Whether you are pregnant or may be pregnant. What are the risks? Talk with your health care provider about risks. The most common problems include: A mild allergic reaction, such as red, swollen areas of skin (hives) and itching. Fever or chills. This may be the body's response to new blood cells received. This may occur during or up to 4 hours after the transfusion. More serious problems may include: A serious allergic reaction that causes difficulty breathing or swelling around the face and lips. Transfusion-associated circulatory overload (TACO), or too much fluid in  the lungs. This may cause breathing problems. Transfusion-related acute lung injury (TRALI), which causes breathing difficulty and low oxygen in the blood. This can occur within hours of the transfusion or several days later. Iron overload. This can happen after receiving many blood transfusions over a period of time. Infection or virus being transmitted. This is rare because donated blood is carefully tested before it is given. Hemolytic transfusion reaction. This is rare. It happens when the body's defense system (immune system)tries to attack the new blood cells. Symptoms may include fever, chills, nausea, low blood pressure, and low back or chest pain. Transfusion-associated graft-versus-host disease (TAGVHD). This is rare. It happens when donated cells attack the body's healthy tissues. What happens before the procedure? You will have a blood test to check your blood type. This test is done to know what kind of blood your body will accept and to match it to the donor blood. If you are going to have a planned surgery, you may be able to do an autologous blood donation. This may be done in case you need to have a transfusion. You will have your temperature, blood pressure, and pulse checked before the transfusion. If you have had an allergic reaction to a transfusion in the past, you may be given medicine to help prevent a reaction. This medicine may be given to you by mouth (orally) or through an IV. What happens during the procedure?  An IV will be inserted into one of your veins. The bag of blood will be attached to your IV. The blood will then enter through your vein. Your temperature, blood pressure,   and pulse will be monitored during the transfusion. This monitoring is done to detect early signs of a transfusion reaction. Tell your nurse right away if you have any of these symptoms during the transfusion: Shortness of breath or trouble breathing. Chest or back pain. Fever or  chills. Itching or hives. If you have any signs or symptoms of a reaction, your transfusion will be stopped and you may be given medicine. When the transfusion is complete, your IV will be removed. Pressure may be applied to the IV site for a few minutes. A bandage (dressing)will be applied. The procedure may vary among health care providers and hospitals. What happens after the procedure? Your temperature, blood pressure, pulse, breathing rate, and blood oxygen level will be monitored until you leave the hospital or clinic. Your blood may be tested to see how you have responded to the transfusion. You may be warmed with fluids or blankets to maintain a normal body temperature. If you receive your blood transfusion in an outpatient setting, you will be told whom to contact to report any reactions. Where to find more information Visit the [employer]: redcross.org Summary A blood transfusion is a procedure in which you receive blood or a type of blood cell (blood component) through an IV. The blood given in a transfusion may be made up of different blood components. You may receive red blood cells, platelets, plasma, or white blood cells depending on the condition treated. Your temperature, blood pressure, and pulse will be monitored before, during, and after the transfusion. After the transfusion, your blood may be tested to see how your body has responded. This information is not intended to replace advice given to you by your health care provider. Make sure you discuss any questions you have with your health care provider. Document Revised: 06/30/2021 Document Reviewed: 06/30/2021 Elsevier Patient Education  2023 Elsevier Inc.  

## 2022-03-14 NOTE — Progress Notes (Incomplete)
  Radiation Oncology         [phone] ________________________________  Patient Name: Edgar Mooney MRN: [id number] DOB: 1965/09/30 Referring Physician: Betsy Coder (Profile Not Attached) Date of Service: 02/14/2022 [facility], [place]                                                        End Of Treatment Note  Diagnoses: C79.51-Secondary malignant neoplasm of bone  Cancer Staging: The encounter diagnosis was Prostate cancer metastatic to bone ([place]) [C61, C79.51].   The encounter diagnosis was Prostate cancer ([place]).   Metastatic prostate cancer; widespread osseous metastatic disease involving nearly all of the axial and appendicular skeleton    Cancer Staging  Prostate cancer [facility]) Staging form: Prostate, AJCC 8th Edition - Clinical: Stage IVB (cTX, cM1b) - Signed by Ladell Pier, MD on 05/23/2021  Intent: Palliative  Radiation Treatment Dates: 02/01/2022 through 02/14/2022 Site Technique Total Dose (Gy) Dose per Fx (Gy) Completed Fx Beam Energies  Hip, Right: Pelvis_R Complex 28/28 3.5 8/8 15X   Narrative: The patient tolerated radiation therapy relatively well. On the date of his final treatment, the patient reported lower back pain, fatigue, skin itching, and redness. He otherwise endorsed a good appetite and improvement of his right hip pain with treatment.   Plan: The patient will follow-up with radiation oncology in one month .  ________________________________________________ -----------------------------------  Blair Promise, PhD, MD  This document serves as a record of services personally performed by Gery Pray, MD. It was created on his behalf by Roney Mans, a trained medical scribe. The creation of this record is based on the scribe's personal observations and the provider's statements to them. This document has been checked and approved by the attending provider.

## 2022-03-14 NOTE — Progress Notes (Signed)
Radiation Oncology         [phone] ________________________________  Name: Edgar Mooney MRN: [id number]  Date: 03/15/2022  DOB: 09-21-1965  Follow-Up Visit Note  CC: Pcp, No  Ladell Pier, MD  No diagnosis found.  Diagnosis: The encounter diagnosis was Prostate cancer metastatic to bone ([place]) [C61, C79.51].   The encounter diagnosis was Prostate cancer ([place]).   Metastatic prostate cancer; widespread osseous metastatic disease involving nearly all of the axial and appendicular skeleton    Cancer Staging  Prostate cancer [facility]) Staging form: Prostate, AJCC 8th Edition - Clinical: Stage IVB (cTX, cM1b) - Signed by Ladell Pier, MD on 05/23/2021  Interval Since Last Radiation: 29 days   Intent: Palliative  Radiation Treatment Dates: 02/01/2022 through 02/14/2022 Site Technique Total Dose (Gy) Dose per Fx (Gy) Completed Fx Beam Energies  Hip, Right: Pelvis_R Complex 28/28 3.5 8/8 15X   Narrative:  The patient returns today for routine follow-up. The patient tolerated radiation therapy relatively well. On the date of his final treatment, the patient reported lower back pain, fatigue, skin itching, and redness. He otherwise endorsed a good appetite and improvement of his right hip pain with treatment.    During his most recent follow-up visit with Dr. Gearldine Shown office on 03/12/22,  the patient endorsed further weight loss and ongoing significant pain in the multiple locations related to his widespread osseous metastatic disease, most notably in his knees and spine. His pain medication was subsequently adjusted to oxycodone 5 -15 mg every 4 hours as needed (previously every 6 hours as needed). His MS Contin was also increased to 60 mg every 12 hours, and he was instructed to resume dexamethasone 4 mg daily. Labs from that day were reviewed and showed progressive anemia, and he was arranged for blood transfusions.   The patient began salvage therapy with Pluvicto on 03/14/22.       ***                         Allergies:  has No Known Allergies.  Meds: Current Outpatient Medications  Medication Sig Dispense Refill   allopurinol (ZYLOPRIM) 100 MG tablet TAKE 1 TABLET BY MOUTH EVERY DAY 90 tablet 1   amLODipine (NORVASC) 5 MG tablet Take 1 tablet (5 mg total) by mouth daily. 90 tablet 3   aspirin 81 MG chewable tablet Chew 1 tablet (81 mg total) by mouth daily. 90 tablet 3   dexamethasone (DECADRON) 4 MG tablet Take 1 tablet (4 mg total) by mouth daily. 30 tablet 1   docusate sodium (COLACE) 100 MG capsule Take 1 capsule (100 mg total) by mouth 2 (two) times daily. 100 capsule 1   fluconazole (DIFLUCAN) 100 MG tablet Take 1 tablet (100 mg total) by mouth daily. 7 tablet 0   morphine (MS CONTIN) 60 MG 12 hr tablet Take 1 tablet (60 mg total) by mouth every 12 (twelve) hours. 60 tablet 0   nitroGLYCERIN (NITROSTAT) 0.4 MG SL tablet Place 1 tablet (0.4 mg total) under the tongue every 5 (five) minutes as needed for chest pain. (Patient not taking: Reported on 11/24/2021) 25 tablet 12   ondansetron (ZOFRAN) 8 MG tablet Take 1 tablet (8 mg total) by mouth every 8 (eight) hours as needed for nausea or vomiting. 30 tablet 1   oxyCODONE (OXY IR/ROXICODONE) 5 MG immediate release tablet Take 1-3 tablets (5-15 mg total) by mouth every 4 (four) hours as needed for severe pain. 100  tablet 0   polyethylene glycol powder (MIRALAX) 17 GM/SCOOP powder Take 17 grams mixed in liquid by mouth daily. 238 g 1   prochlorperazine (COMPAZINE) 10 MG tablet Take 1 tablet (10 mg total) by mouth every 6 (six) hours as needed for nausea. (Patient not taking: Reported on 02/06/2022) 60 tablet 0   rosuvastatin (CRESTOR) 20 MG tablet Take 1 tablet (20 mg total) by mouth daily. 90 tablet 3   tamsulosin (FLOMAX) 0.4 MG CAPS capsule Take 1 capsule (0.4 mg total) by mouth daily. 90 capsule 0   ticagrelor (BRILINTA) 90 MG TABS tablet Take 1 tablet (90 mg total) by mouth 2 (two) times daily. 120 tablet 3    Current Facility-Administered Medications  Medication Dose Route Frequency Provider Last Rate Last Admin   sodium chloride flush (NS) 0.9 % injection 3 mL  3 mL Intravenous Q12H [name], MD        Physical Findings: The patient is in no acute distress. Patient is alert and oriented.  vitals were not taken for this visit. .  No significant changes. Lungs are clear to auscultation bilaterally. Heart has regular rate and rhythm. No palpable cervical, supraclavicular, or axillary adenopathy. Abdomen soft, non-tender, normal bowel sounds.   Lab Findings: Lab Results  Component Value Date   WBC 5.6 03/14/2022   HGB 7.5 (L) 03/14/2022   HCT 23.0 (L) 03/14/2022   MCV 92.4 03/14/2022   PLT 62 (L) 03/14/2022    Radiographic Findings: No results found.  Impression:  The encounter diagnosis was Prostate cancer metastatic to bone ([place]) [C61, C79.51].   The encounter diagnosis was Prostate cancer ([place]).   Metastatic prostate cancer; widespread osseous metastatic disease involving nearly all of the axial and appendicular skeleton   The patient is recovering from the effects of radiation.  ***  Plan:  ***   *** minutes of total time was spent for this patient encounter, including preparation, face-to-face counseling with the patient and coordination of care, physical exam, and documentation of the encounter. ____________________________________  Blair Promise, PhD, MD  This document serves as a record of services personally performed by Gery Pray, MD. It was created on his behalf by Roney Mans, a trained medical scribe. The creation of this record is based on the scribe's personal observations and the provider's statements to them. This document has been checked and approved by the attending provider.

## 2022-03-14 NOTE — Telephone Encounter (Addendum)
CRITICAL VALUE STICKER  CRITICAL VALUE:  Hemoglobin 6.9  RECEIVER (on-site recipient of call): Jerene Canny  DATE & TIME NOTIFIED: 03/14/22 12.30  MESSENGER (representative from lab): Lawana Pai  MD NOTIFIED: Ned Card  TIME OF NOTIFICATION: 12:30  RESPONSE:  Patient is schedule for a unit of RBCS at [facility] on 03/15/22

## 2022-03-15 ENCOUNTER — Ambulatory Visit
Admission: RE | Admit: 2022-03-15 | Discharge: 2022-03-15 | Disposition: A | Discharge status: Home / Self Care | Payer: BC Managed Care – PPO | Source: Ambulatory Visit | Attending: Radiation Oncology | Admitting: Radiation Oncology

## 2022-03-15 ENCOUNTER — Inpatient Hospital Stay: Payer: BC Managed Care – PPO

## 2022-03-15 ENCOUNTER — Encounter: Payer: Self-pay | Admitting: Radiation Oncology

## 2022-03-15 ENCOUNTER — Other Ambulatory Visit: Payer: Self-pay

## 2022-03-15 ENCOUNTER — Ambulatory Visit (HOSPITAL_COMMUNITY)
Admission: RE | Admit: 2022-03-15 | Discharge: 2022-03-15 | Disposition: A | Discharge status: Home / Self Care | Payer: BC Managed Care – PPO | Source: Ambulatory Visit | Attending: Nurse Practitioner | Admitting: Nurse Practitioner

## 2022-03-15 VITALS — BP 109/68 | HR 104 | Temp 98.1°F | Resp 20 | Ht 62.0 in | Wt 126.0 lb

## 2022-03-15 VITALS — BP 117/73 | HR 104 | Temp 98.3°F | Resp 16

## 2022-03-15 DIAGNOSIS — C61 Malignant neoplasm of prostate: Secondary | ICD-10-CM | POA: Diagnosis not present

## 2022-03-15 DIAGNOSIS — Z79899 Other long term (current) drug therapy: Secondary | ICD-10-CM | POA: Insufficient documentation

## 2022-03-15 DIAGNOSIS — C7951 Secondary malignant neoplasm of bone: Secondary | ICD-10-CM | POA: Diagnosis not present

## 2022-03-15 MED ORDER — SODIUM CHLORIDE 0.9% IV SOLUTION
250.0000 mL | Freq: Once | INTRAVENOUS | Status: AC
  Administered 2022-03-15: 250 mL via INTRAVENOUS

## 2022-03-15 MED ORDER — SODIUM CHLORIDE 0.9 % IV BOLUS: 1000.0000 mL | Freq: Once | INTRAVENOUS | Status: DC

## 2022-03-15 MED ORDER — LUTETIUM LU 177 VIPIVOTIDE TET 1000 MBQ/ML IV SOLN
151.0000 | Freq: Once | INTRAVENOUS | Status: AC
  Administered 2022-03-15: 151 via INTRAVENOUS

## 2022-03-15 MED ORDER — OXYCODONE HCL 5 MG PO TABS
5.0000 mg | ORAL_TABLET | Freq: Once | ORAL | Status: AC
  Administered 2022-03-15: 5 mg via ORAL
  Filled 2022-03-15: qty 1

## 2022-03-15 NOTE — Progress Notes (Signed)
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  Patient status post failed Taxotere chemotherapy.  Widespread PSMA positive metastatic bone prostate cancer.]  EXAM: NUCLEAR MEDICINE PLUVICTO INJECTION  TECHNIQUE: Infusion: The nuclear medicine technologist and I personally verified the dose activity to be delivered as specified in the written directive, and verified the patient identification via 2 separate methods.  Initial flush of the intravenous catheter was performed was sterile saline. The dose syringe was connected to the catheter and the Lu-177 Pluvicto administered over a 1 to 10 min infusion. Single 10 cc  lushes with normal saline follow the dose. No complications were noted. The entire IV tubing, venocatheter, stopcock and syringes was removed in total, placed in a disposal bag and sent for assay of the residual activity, which will be reported at a later time in our EMR by the physics staff. Pressure was applied to the venipuncture site, and a compression bandage placed. Patient monitored for 1 hour following infusion.    Radiation Safety personnel were present to perform the discharge survey, as detailed on their documentation. After a short period of observation, the patient had his IV removed.  RADIOPHARMACEUTICALS: [151] microcuries Lu-177 PLUVICTO  FINDINGS: Current Infusion: [    ]  Planned Infusions: 6    Patient presented to nuclear medicine for treatment on 03/14/2022.  The patient's most recent blood counts were reviewed and since teh initial consultation his hemoglobin and platelet levels had  decline.      Hemoglobin equal 6.2   and platelets equal 59 .     Patient was transfused on 11/29/223.     Today following transfusion hemoglobin equal 7.5 and platelets equal 62.     A long discussion with patient explaining risks and benefits of the procedure.  In consultation with oncologist Dr. Ammie Dalton and patient,  we  are in agreement to proceed with treatment with  reduced dose 449 millicurie.     Detailed explanation primary risk of treatment being myelosuppression with potential marrow failure and death.     Additional risks renal toxicity and hepatic toxicity or also explained.     Patient to be alert to progressive fatigue, bruising, bleeding, or infection. Transfusion and evaluation scheduled for 03/19/22.      The patient was situated in an infusion suite with a contact barrier placed under the arm. Intravenous access was established, using sterile technique, and a normal saline infusion from a syringe was started.     Micro-dosimetry: The prescribed radiation activity was assayed and confirmed to be within specified tolerance.  IMPRESSION: Current Infusion: [1]  Planned Infusions: 6    [The patient tolerated the infusion well. The patient will return in 6 weeks for ongoing care.]

## 2022-03-15 NOTE — Progress Notes (Addendum)
Edgar Mooney is here today for follow up post radiation to the pelvis (Right Hip)  They completed their radiation on: 02/14/22   Does the patient complain of any of the following:  Pain: Yes, patient reports having generalized pain.  Nausea/Vomiting: Nausea at times.  Ambulatory Status: Wheel chair for long distances.  Weight:  Wt Readings from Last 3 Encounters:  03/15/22 126 lb (57.2 kg)  03/12/22 124 lb (56.2 kg)  02/15/22 144 lb 9.6 oz (65.6 kg)   Post radiation skin changes: No Fatigue: Low    Additional comments if applicable:  Patient to start new treatment for metastatic prostate cancer.   Patient reports having numbness to lips and  top of his head. Patient also reports changes in vision.   Patient requesting assistance with finding a new PCP. New appointment schedule with [facility] Primary care on [address] on 05/30/22 @ 1 pm With [name] NP. Patient aware,   BP 109/68 (BP Location: Left Arm, Patient Position: Sitting, Cuff Size: Normal)   Pulse (!) 104   Temp 98.1 F (36.7 C)   Resp 20   Ht '5\' 2"'$  (1.575 m)   Wt 126 lb (57.2 kg)   SpO2 98%   BMI 23.05 kg/m

## 2022-03-15 NOTE — Written Directive (Addendum)
  PLUVICTO  THERAPY   RADIOPHARMACEUTICAL: Lutetium 177 vipivotide tetraxetan (Pluvicto)     PRESCRIBED DOSE FOR ADMINISTRATION:  150 MCI ( REDUCED DOSE REQUESTED PER DR Deundre Thong)   ROUTE OFADMINISTRATION:  IV   DIAGNOSIS:  PROSTATE CANCER   REFERRING PHYSICIAN: Ned Card, NP   TREATMENT #: 1   ADDITIONAL PHYSICIAN COMMENTS/NOTES:  Reduced dose - reduce risk of further myelosupression  AUTHORIZED USER SIGNATURE & TIME STAMP: Rennis Golden, MD   03/15/22    12:51 PM

## 2022-03-15 NOTE — Patient Instructions (Signed)

## 2022-03-16 LAB — TYPE AND SCREEN
ABO/RH(D): A POS
Antibody Screen: NEGATIVE
Unit division: 0
Unit division: 0

## 2022-03-16 LAB — BPAM RBC
Blood Product Expiration Date: 202312192359
Blood Product Expiration Date: 202312192359
ISSUE DATE / TIME: 202311290937
ISSUE DATE / TIME: 202311301009
Unit Type and Rh: 6200
Unit Type and Rh: 6200

## 2022-03-17 ENCOUNTER — Other Ambulatory Visit (HOSPITAL_BASED_OUTPATIENT_CLINIC_OR_DEPARTMENT_OTHER): Payer: Self-pay

## 2022-03-19 ENCOUNTER — Other Ambulatory Visit (HOSPITAL_BASED_OUTPATIENT_CLINIC_OR_DEPARTMENT_OTHER): Payer: Self-pay

## 2022-03-19 ENCOUNTER — Inpatient Hospital Stay (HOSPITAL_BASED_OUTPATIENT_CLINIC_OR_DEPARTMENT_OTHER): Payer: BC Managed Care – PPO | Admitting: Nurse Practitioner

## 2022-03-19 ENCOUNTER — Telehealth: Payer: Self-pay

## 2022-03-19 ENCOUNTER — Encounter: Payer: Self-pay | Admitting: Nurse Practitioner

## 2022-03-19 ENCOUNTER — Inpatient Hospital Stay: Payer: BC Managed Care – PPO | Attending: Nurse Practitioner

## 2022-03-19 ENCOUNTER — Other Ambulatory Visit: Payer: Self-pay

## 2022-03-19 DIAGNOSIS — F32A Depression, unspecified: Secondary | ICD-10-CM | POA: Insufficient documentation

## 2022-03-19 DIAGNOSIS — D649 Anemia, unspecified: Secondary | ICD-10-CM | POA: Diagnosis not present

## 2022-03-19 DIAGNOSIS — C220 Liver cell carcinoma: Secondary | ICD-10-CM | POA: Insufficient documentation

## 2022-03-19 DIAGNOSIS — Z8619 Personal history of other infectious and parasitic diseases: Secondary | ICD-10-CM | POA: Insufficient documentation

## 2022-03-19 DIAGNOSIS — C7951 Secondary malignant neoplasm of bone: Secondary | ICD-10-CM | POA: Diagnosis not present

## 2022-03-19 DIAGNOSIS — R162 Hepatomegaly with splenomegaly, not elsewhere classified: Secondary | ICD-10-CM | POA: Diagnosis not present

## 2022-03-19 DIAGNOSIS — C61 Malignant neoplasm of prostate: Secondary | ICD-10-CM

## 2022-03-19 DIAGNOSIS — K7460 Unspecified cirrhosis of liver: Secondary | ICD-10-CM | POA: Insufficient documentation

## 2022-03-19 DIAGNOSIS — F17200 Nicotine dependence, unspecified, uncomplicated: Secondary | ICD-10-CM | POA: Insufficient documentation

## 2022-03-19 DIAGNOSIS — M109 Gout, unspecified: Secondary | ICD-10-CM | POA: Insufficient documentation

## 2022-03-19 LAB — CMP (CANCER CENTER ONLY)
ALT: <5 U/L (ref 0–44)
AST: 17 U/L (ref 15–41)
Albumin: 3.4 g/dL — ABNORMAL LOW (ref 3.5–5.0)
Alkaline Phosphatase: 176 U/L — ABNORMAL HIGH (ref 38–126)
Anion gap: 13 (ref 5–15)
BUN: 19 mg/dL (ref 6–20)
CO2: 18 mmol/L — ABNORMAL LOW (ref 22–32)
Calcium: 8 mg/dL — ABNORMAL LOW (ref 8.9–10.3)
Chloride: 102 mmol/L (ref 98–111)
Creatinine: 0.89 mg/dL (ref 0.61–1.24)
GFR, Estimated: >60 mL/min (ref >60)
Glucose, Bld: 120 mg/dL — ABNORMAL HIGH (ref 70–99)
Potassium: 3.2 mmol/L — ABNORMAL LOW (ref 3.5–5.1)
Sodium: 133 mmol/L — ABNORMAL LOW (ref 135–145)
Total Bilirubin: 0.9 mg/dL (ref 0.3–1.2)
Total Protein: 6.2 g/dL — ABNORMAL LOW (ref 6.5–8.1)

## 2022-03-19 LAB — CBC WITH DIFFERENTIAL (CANCER CENTER ONLY)
Abs Immature Granulocytes: 0.2 10*3/uL — ABNORMAL HIGH (ref 0.00–0.07)
Basophils Absolute: 0 10*3/uL (ref 0.0–0.1)
Basophils Relative: 0 %
Eosinophils Absolute: 0 10*3/uL (ref 0.0–0.5)
Eosinophils Relative: 0 %
HCT: 23.3 % — ABNORMAL LOW (ref 39.0–52.0)
Hemoglobin: 7.6 g/dL — ABNORMAL LOW (ref 13.0–17.0)
Lymphocytes Relative: 9 %
Lymphs Abs: 0.4 10*3/uL — ABNORMAL LOW (ref 0.7–4.0)
MCH: 29.9 pg (ref 26.0–34.0)
MCHC: 32.6 g/dL (ref 30.0–36.0)
MCV: 91.7 fL (ref 80.0–100.0)
Metamyelocytes Relative: 1 %
Monocytes Absolute: 0.5 10*3/uL (ref 0.1–1.0)
Monocytes Relative: 10 %
Myelocytes: 2 %
Neutro Abs: 3.5 10*3/uL (ref 1.7–7.7)
Neutrophils Relative %: 77 %
Platelet Count: 61 10*3/uL — ABNORMAL LOW (ref 150–400)
Promyelocytes Relative: 1 %
RBC: 2.54 MIL/uL — ABNORMAL LOW (ref 4.22–5.81)
RDW: 17.3 % — ABNORMAL HIGH (ref 11.5–15.5)
WBC Count: 4.5 10*3/uL (ref 4.0–10.5)
nRBC: 0.4 % — ABNORMAL HIGH (ref 0.0–0.2)

## 2022-03-19 LAB — PREPARE RBC (CROSSMATCH)

## 2022-03-19 LAB — SAMPLE TO BLOOD BANK

## 2022-03-19 MED ORDER — POTASSIUM CHLORIDE CRYS ER 10 MEQ PO TBCR
10.0000 meq | EXTENDED_RELEASE_TABLET | Freq: Every day | ORAL | 3 refills | Status: DC
  Filled 2022-03-19: qty 30, 30d supply, fill #0

## 2022-03-19 MED ORDER — OXYCODONE HCL 5 MG PO TABS
5.0000 mg | ORAL_TABLET | ORAL | 0 refills | Status: DC | PRN
  Filled 2022-03-19: qty 100, 6d supply, fill #0

## 2022-03-19 NOTE — Progress Notes (Signed)
[place] OFFICE PROGRESS NOTE   Diagnosis: Prostate cancer, hepatocellular carcinoma  INTERVAL HISTORY:   Edgar Mooney returns as scheduled.  He completed a first treatment with Pluvicto 03/15/2022.  Aside from fatigue he feels he to rated well.  Main complaint continues to be pain in multiple locations.  He continues oxycodone every 3-4 hours.  He has not started MS Contin.  Bowels are moving.  No diarrhea.  No nausea or vomiting.  Objective:  Vital signs in last 24 hours:  Blood pressure 109/70, pulse (!) 106, temperature 99 F (37.2 C), temperature source Temporal, resp. rate 18, weight 123 lb 12.8 oz (56.2 kg), SpO2 99 %.    HEENT: Mild white coating over tongue.  No buccal thrush. Resp: Lungs clear bilaterally. Cardio: Regular rate and rhythm. GI: Abdomen soft and nontender.  No hepatosplenomegaly. Vascular: No leg edema.   Lab Results:  Lab Results  Component Value Date   WBC 4.5 03/19/2022   HGB 7.6 (L) 03/19/2022   HCT 23.3 (L) 03/19/2022   MCV 91.7 03/19/2022   PLT 61 (L) 03/19/2022   NEUTROABS 3.5 03/19/2022    Imaging:  No results found.  Medications: I have reviewed the patient's current medications.  Assessment/Plan: Metastatic prostate cancer - Lytic bone lesions with retroperitoneal adenopathy concerning for metastatic prostate cancer -03/03/2020-CTA chest/abdomen/pelvis widespread osseous metastatic disease and lower retroperitoneal adenopathy, pathologic right anterior third rib fracture, probable spinal canal tumor at the level of S1, -MRI cervical, thoracic, and lumbar spine 03/03/2020-diffuse osseous metastatic disease, ventral epidural tumor impinging on right S1 nerve root, extraosseous tumor at the bilateral ilium, asymmetric enhancing material at the right C5-6 canal-right  -03/03/2020-PSA 763 -Degarelix 03/04/2020 -Abiraterone/prednisone 03/14/2020 -Every 51-monthLupron 04/01/2020 -04/01/2020 PSA 36.5 -05/31/2020 PSA  1.2 -06/28/2020 PSA 1.0 -12/14/2020 PSA 6.4 -01/13/2021 PSA 10.6 -04/26/2021 PSA  45.2 -05/16/2021 PSA 70 -Abiraterone/prednisone discontinued 05/16/2021 -05/16/2021 left hip x-ray-multifocal sclerotic metastasis throughout the pelvis, confluent involving the left acetabulum.  No acute or pathologic fracture.  Left hip osteoarthritis. -Cycle 1 docetaxel 05/31/2021 -05/31/2021 PSA 113 -Palliative radiation to the left acetabulum 06/08/2021 - 06/20/2021 -Cycle 2 docetaxel 06/21/2021 -06/21/2021 PSA improved at 68 -Cycle 3 docetaxel 07/12/2021 -07/12/2021 PSA improved at 52 -Cycle 4 docetaxel 08/02/2021 -Cycle 5 docetaxel 08/22/2021 -CT chest 09/16/2021-negative for pulmonary embolism, right liver mass, extensive bone metastases -Cycle 6 docetaxel 09/28/2021 -PSA increased 09/28/2021 -Olaparib 10/24/2021, patient decreased dose to 1 tablet twice daily on 10/31/2021 due to muscle spasms; dose resumed at prescribed amount 11/06/2021 -PSA increased 11/24/2021 -PSA further increased 12/08/2021 -Olaparib discontinued -PET PSMA scan 01/11/2022-widespread intensely radiotracer avid sclerotic skeletal metastasis involving the axillary and appendicular skeleton.  Small-volume periaortic retroperitoneal radiotracer avid prostate cancer nodal metastasis.  Nonspecific activity in the prostate gland.  No liver or lung prostate cancer metastasis. -Palliative radiation to the right pelvis and proximal right femur 02/01/2022 -Pluvicto 03/14/2022   2.  Severe anemia-likely secondary to metastatic prostate cancer involving the bones 3.  Mild thrombocytopenia 4.  Cirrhosis with splenomegaly 5.  History of hepatitis C 6.  History of polysubstance abuse 7.  Depression 8.  Tobacco dependence 9.  Right arm weakness, right facial numbness-potentially related to nerve root compromise from metastatic bone lesions 10.  Pain secondary to #1 11.  Extensive bone metastases-every 324-monthometa starting 04/01/2020 12.  Gout-acute flare right  wrist 04/01/2020 treated with indomethacin, allopurinol resumed 13.  Right hepatic lobe mass with elevated AFP concerning for [facility] 12/21/2020-hypoenhancing inferior right liver mass, segment  6, occluding or compressing the adjacent right portal vein branch, intrahepatic cholangiocarcinoma favored with differential including hepatocellular carcinoma, Li-Rads M Y 90 planning study 11-22 Y90 right lobe of liver 06/26/2021 14.  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  Presentation to the [facility] emergency room 09/16/2021 with chest pain-transfer to [facility] on a morphine drip and scheduled Ativan  Disposition: [name] appears unchanged.  He completed the first Pluvicto treatment 03/14/2022.  Next Pluvicto is scheduled 04/25/2022.  He continues to have pain at multiple locations.  He is taking oxycodone every 3-4 hours.  He has not started MS Contin.  He plans to begin later today.  Oxycodone refill sent to the pharmacy.  CBC and chemistry panel reviewed.  We are making arrangements for a blood transfusion.  He will begin Micro-K 10 meq daily.  He will return for lab in 1 week.  We will see him in follow-up in 2 weeks.   Ned Card ANP/GNP-BC   03/19/2022  1:58 PM

## 2022-03-19 NOTE — Telephone Encounter (Signed)
Nutrition  Called patient for nutrition follow-up.  No answer.  Left message with call back number.  Pascal Stiggers B. Doran Nestle, RD, LDN Registered Dietitian [phone]  

## 2022-03-19 NOTE — Addendum Note (Signed)
Addended by: Terri Skains S on: 03/19/2022 03:00 PM   Modules accepted: Orders

## 2022-03-19 NOTE — Telephone Encounter (Signed)
RBCS order are in, dash pick up at 8 and kelly confirm the order.

## 2022-03-20 ENCOUNTER — Inpatient Hospital Stay: Payer: BC Managed Care – PPO

## 2022-03-20 DIAGNOSIS — C61 Malignant neoplasm of prostate: Secondary | ICD-10-CM

## 2022-03-20 DIAGNOSIS — M109 Gout, unspecified: Secondary | ICD-10-CM | POA: Diagnosis not present

## 2022-03-20 DIAGNOSIS — R162 Hepatomegaly with splenomegaly, not elsewhere classified: Secondary | ICD-10-CM | POA: Diagnosis not present

## 2022-03-20 DIAGNOSIS — C7951 Secondary malignant neoplasm of bone: Secondary | ICD-10-CM | POA: Diagnosis not present

## 2022-03-20 DIAGNOSIS — F32A Depression, unspecified: Secondary | ICD-10-CM | POA: Diagnosis not present

## 2022-03-20 DIAGNOSIS — D649 Anemia, unspecified: Secondary | ICD-10-CM | POA: Diagnosis not present

## 2022-03-20 DIAGNOSIS — C220 Liver cell carcinoma: Secondary | ICD-10-CM | POA: Diagnosis not present

## 2022-03-20 DIAGNOSIS — F17200 Nicotine dependence, unspecified, uncomplicated: Secondary | ICD-10-CM | POA: Diagnosis not present

## 2022-03-20 DIAGNOSIS — K7460 Unspecified cirrhosis of liver: Secondary | ICD-10-CM | POA: Diagnosis not present

## 2022-03-20 DIAGNOSIS — Z8619 Personal history of other infectious and parasitic diseases: Secondary | ICD-10-CM | POA: Diagnosis not present

## 2022-03-20 MED ORDER — SODIUM CHLORIDE 0.9% IV SOLUTION
250.0000 mL | Freq: Once | INTRAVENOUS | Status: AC
  Administered 2022-03-20: 250 mL via INTRAVENOUS

## 2022-03-20 NOTE — Patient Instructions (Signed)

## 2022-03-21 LAB — BPAM RBC
Blood Product Expiration Date: 202312272359
Blood Product Expiration Date: 202312272359
ISSUE DATE / TIME: 202312050753
ISSUE DATE / TIME: 202312050753
Unit Type and Rh: 6200
Unit Type and Rh: 6200

## 2022-03-21 LAB — TYPE AND SCREEN
ABO/RH(D): A POS
Antibody Screen: NEGATIVE
Unit division: 0
Unit division: 0

## 2022-03-26 ENCOUNTER — Inpatient Hospital Stay: Payer: BC Managed Care – PPO

## 2022-03-26 DIAGNOSIS — Z8619 Personal history of other infectious and parasitic diseases: Secondary | ICD-10-CM | POA: Diagnosis not present

## 2022-03-26 DIAGNOSIS — M109 Gout, unspecified: Secondary | ICD-10-CM | POA: Diagnosis not present

## 2022-03-26 DIAGNOSIS — C220 Liver cell carcinoma: Secondary | ICD-10-CM | POA: Diagnosis not present

## 2022-03-26 DIAGNOSIS — R162 Hepatomegaly with splenomegaly, not elsewhere classified: Secondary | ICD-10-CM | POA: Diagnosis not present

## 2022-03-26 DIAGNOSIS — C7951 Secondary malignant neoplasm of bone: Secondary | ICD-10-CM | POA: Diagnosis not present

## 2022-03-26 DIAGNOSIS — F32A Depression, unspecified: Secondary | ICD-10-CM | POA: Diagnosis not present

## 2022-03-26 DIAGNOSIS — F17200 Nicotine dependence, unspecified, uncomplicated: Secondary | ICD-10-CM | POA: Diagnosis not present

## 2022-03-26 DIAGNOSIS — D649 Anemia, unspecified: Secondary | ICD-10-CM | POA: Diagnosis not present

## 2022-03-26 DIAGNOSIS — K7460 Unspecified cirrhosis of liver: Secondary | ICD-10-CM | POA: Diagnosis not present

## 2022-03-26 DIAGNOSIS — C61 Malignant neoplasm of prostate: Secondary | ICD-10-CM

## 2022-03-26 LAB — BASIC METABOLIC PANEL - CANCER CENTER ONLY
Anion gap: 13 (ref 5–15)
BUN: 17 mg/dL (ref 6–20)
CO2: 19 mmol/L — ABNORMAL LOW (ref 22–32)
Calcium: 7.1 mg/dL — ABNORMAL LOW (ref 8.9–10.3)
Chloride: 107 mmol/L (ref 98–111)
Creatinine: 0.43 mg/dL — ABNORMAL LOW (ref 0.61–1.24)
GFR, Estimated: >60 mL/min (ref >60)
Glucose, Bld: 101 mg/dL — ABNORMAL HIGH (ref 70–99)
Potassium: 4 mmol/L (ref 3.5–5.1)
Sodium: 139 mmol/L (ref 135–145)

## 2022-03-26 LAB — CBC WITH DIFFERENTIAL (CANCER CENTER ONLY)
Abs Immature Granulocytes: 1.69 10*3/uL — ABNORMAL HIGH (ref 0.00–0.07)
Basophils Absolute: 0 10*3/uL (ref 0.0–0.1)
Basophils Relative: 0 %
Eosinophils Absolute: 0.2 10*3/uL (ref 0.0–0.5)
Eosinophils Relative: 2 %
HCT: 32.4 % — ABNORMAL LOW (ref 39.0–52.0)
Hemoglobin: 10.5 g/dL — ABNORMAL LOW (ref 13.0–17.0)
Immature Granulocytes: 23 %
Lymphocytes Relative: 6 %
Lymphs Abs: 0.4 10*3/uL — ABNORMAL LOW (ref 0.7–4.0)
MCH: 30.2 pg (ref 26.0–34.0)
MCHC: 32.4 g/dL (ref 30.0–36.0)
MCV: 93.1 fL (ref 80.0–100.0)
Monocytes Absolute: 0.6 10*3/uL (ref 0.1–1.0)
Monocytes Relative: 8 %
Neutro Abs: 4.4 10*3/uL (ref 1.7–7.7)
Neutrophils Relative %: 61 %
Platelet Count: 79 10*3/uL — ABNORMAL LOW (ref 150–400)
RBC: 3.48 MIL/uL — ABNORMAL LOW (ref 4.22–5.81)
RDW: 16.7 % — ABNORMAL HIGH (ref 11.5–15.5)
WBC Count: 7.3 10*3/uL (ref 4.0–10.5)
nRBC: 1.1 % — ABNORMAL HIGH (ref 0.0–0.2)

## 2022-03-26 LAB — SAMPLE TO BLOOD BANK

## 2022-03-28 ENCOUNTER — Other Ambulatory Visit: Payer: Self-pay | Admitting: *Deleted

## 2022-03-28 ENCOUNTER — Other Ambulatory Visit (HOSPITAL_BASED_OUTPATIENT_CLINIC_OR_DEPARTMENT_OTHER): Payer: Self-pay

## 2022-03-28 ENCOUNTER — Other Ambulatory Visit: Payer: Self-pay | Admitting: Nurse Practitioner

## 2022-03-28 ENCOUNTER — Telehealth: Payer: Self-pay | Admitting: *Deleted

## 2022-03-28 DIAGNOSIS — C61 Malignant neoplasm of prostate: Secondary | ICD-10-CM

## 2022-03-28 MED ORDER — OXYCODONE HCL 5 MG PO TABS
5.0000 mg | ORAL_TABLET | ORAL | 0 refills | Status: DC | PRN
  Filled 2022-03-28: qty 100, 6d supply, fill #0

## 2022-03-28 NOTE — Progress Notes (Signed)
Ordered Pluvicto labs for 04/18/22. Infusion scheduled for 04/25/22. Scheduling message sent.

## 2022-03-28 NOTE — Telephone Encounter (Signed)
Called and left VM requesting lab results of 03/26/22; refill on oxycodone; and what can he take for cold symptoms. Informed him that labs are OK-calcium low due to Pluvicto. Use whatever OTC he has taken in past for his sinus congestion. Suggested he ask pharmacist what to get when he picks up his pain medication today that has been sent.

## 2022-03-29 ENCOUNTER — Telehealth: Payer: Self-pay

## 2022-03-29 NOTE — Telephone Encounter (Signed)
Edgar Mooney called and request a hospital bed. I order the hospital bed through [place].

## 2022-04-02 ENCOUNTER — Ambulatory Visit: Payer: BC Managed Care – PPO

## 2022-04-02 NOTE — Progress Notes (Signed)
Nutrition Follow-up:  Patient with prostate cancer and hepatocellular carcinoma.  Patient receiving pluvicto.  Spoke with patient via phone for nutrition follow-up.  Patient reports that his appetite is coming back and he is eating more.  Usually eats about 2 meals a day (mid morning breakfast and supper). Drinks ensure shakes.  Denies nausea.  This am ate bacon, eggs and potatoes.  Supper last night was beef stew and vegetables.      Medications: reviewed  Labs: reviewed  Anthropometrics:   Weight 123 lb 12.8 oz on 12/4  144 lb on 11/2 (swelling) 158 lb on 5/9   NUTRITION DIAGNOSIS: Unintentional weight loss continues    INTERVENTION:  Discussed ways to add calories to food.  Will mail High Calorie, High Protein Diet. Encouraged 350 calorie ensure/equivalent.  Will mail coupons Contact information mailed.    MONITORING, EVALUATION, GOAL: weight trends, intake   NEXT VISIT: Monday, Jan 15 phone call  Doyce Saling B. Zenia Resides, [place], [place] Registered Dietitian [phone]

## 2022-04-03 ENCOUNTER — Other Ambulatory Visit (HOSPITAL_BASED_OUTPATIENT_CLINIC_OR_DEPARTMENT_OTHER): Payer: Self-pay

## 2022-04-06 ENCOUNTER — Inpatient Hospital Stay (HOSPITAL_BASED_OUTPATIENT_CLINIC_OR_DEPARTMENT_OTHER): Payer: BC Managed Care – PPO | Admitting: Oncology

## 2022-04-06 ENCOUNTER — Other Ambulatory Visit (HOSPITAL_BASED_OUTPATIENT_CLINIC_OR_DEPARTMENT_OTHER): Payer: Self-pay | Admitting: Physical Medicine and Rehabilitation

## 2022-04-06 ENCOUNTER — Ambulatory Visit (HOSPITAL_BASED_OUTPATIENT_CLINIC_OR_DEPARTMENT_OTHER)
Admission: RE | Admit: 2022-04-06 | Discharge: 2022-04-06 | Disposition: A | Discharge status: Home / Self Care | Payer: BC Managed Care – PPO | Source: Ambulatory Visit | Attending: Oncology | Admitting: Oncology

## 2022-04-06 ENCOUNTER — Inpatient Hospital Stay: Payer: BC Managed Care – PPO

## 2022-04-06 ENCOUNTER — Other Ambulatory Visit (HOSPITAL_BASED_OUTPATIENT_CLINIC_OR_DEPARTMENT_OTHER): Payer: Self-pay

## 2022-04-06 ENCOUNTER — Encounter: Payer: Self-pay | Admitting: Oncology

## 2022-04-06 VITALS — BP 141/70 | HR 67 | Temp 98.1°F | Resp 18 | Ht 62.0 in | Wt 130.8 lb

## 2022-04-06 DIAGNOSIS — C61 Malignant neoplasm of prostate: Secondary | ICD-10-CM

## 2022-04-06 DIAGNOSIS — R600 Localized edema: Secondary | ICD-10-CM | POA: Diagnosis not present

## 2022-04-06 LAB — CBC WITH DIFFERENTIAL (CANCER CENTER ONLY)
Abs Immature Granulocytes: 0.21 10*3/uL — ABNORMAL HIGH (ref 0.00–0.07)
Basophils Absolute: 0 10*3/uL (ref 0.0–0.1)
Basophils Relative: 1 %
Eosinophils Absolute: 0 10*3/uL (ref 0.0–0.5)
Eosinophils Relative: 0 %
HCT: 24.4 % — ABNORMAL LOW (ref 39.0–52.0)
Hemoglobin: 7.8 g/dL — ABNORMAL LOW (ref 13.0–17.0)
Immature Granulocytes: 7 %
Lymphocytes Relative: 15 %
Lymphs Abs: 0.5 10*3/uL — ABNORMAL LOW (ref 0.7–4.0)
MCH: 30 pg (ref 26.0–34.0)
MCHC: 32 g/dL (ref 30.0–36.0)
MCV: 93.8 fL (ref 80.0–100.0)
Monocytes Absolute: 0.3 10*3/uL (ref 0.1–1.0)
Monocytes Relative: 9 %
Neutro Abs: 2.1 10*3/uL (ref 1.7–7.7)
Neutrophils Relative %: 68 %
Platelet Count: 53 10*3/uL — ABNORMAL LOW (ref 150–400)
RBC: 2.6 MIL/uL — ABNORMAL LOW (ref 4.22–5.81)
RDW: 17.7 % — ABNORMAL HIGH (ref 11.5–15.5)
WBC Count: 3.1 10*3/uL — ABNORMAL LOW (ref 4.0–10.5)
nRBC: 1.3 % — ABNORMAL HIGH (ref 0.0–0.2)

## 2022-04-06 LAB — CMP (CANCER CENTER ONLY)
ALT: 7 U/L (ref 0–44)
AST: 12 U/L — ABNORMAL LOW (ref 15–41)
Albumin: 3.5 g/dL (ref 3.5–5.0)
Alkaline Phosphatase: 324 U/L — ABNORMAL HIGH (ref 38–126)
Anion gap: 12 (ref 5–15)
BUN: 13 mg/dL (ref 6–20)
CO2: 16 mmol/L — ABNORMAL LOW (ref 22–32)
Calcium: 7.8 mg/dL — ABNORMAL LOW (ref 8.9–10.3)
Chloride: 111 mmol/L (ref 98–111)
Creatinine: 0.39 mg/dL — ABNORMAL LOW (ref 0.61–1.24)
GFR, Estimated: >60 mL/min (ref >60)
Glucose, Bld: 123 mg/dL — ABNORMAL HIGH (ref 70–99)
Potassium: 3.5 mmol/L (ref 3.5–5.1)
Sodium: 139 mmol/L (ref 135–145)
Total Bilirubin: 0.7 mg/dL (ref 0.3–1.2)
Total Protein: 5.8 g/dL — ABNORMAL LOW (ref 6.5–8.1)

## 2022-04-06 LAB — SAMPLE TO BLOOD BANK

## 2022-04-06 MED ORDER — MORPHINE SULFATE ER 60 MG PO TBCR
60.0000 mg | EXTENDED_RELEASE_TABLET | Freq: Two times a day (BID) | ORAL | 0 refills | Status: DC
  Filled 2022-04-06 – 2022-04-18 (×2): qty 60, 30d supply, fill #0

## 2022-04-06 MED ORDER — OXYCODONE HCL 5 MG PO TABS
5.0000 mg | ORAL_TABLET | ORAL | 0 refills | Status: DC | PRN
  Filled 2022-04-06: qty 150, 9d supply, fill #0

## 2022-04-06 NOTE — Progress Notes (Signed)
Edgar Mooney   Diagnosis: Prostate cancer  INTERVAL HISTORY:   Edgar Mooney returns as scheduled.  He continues MS Contin and oxycodone for pain.  He takes oxycodone approximately 5 times per day.  He is having bowel movements.  He reports partial improvement in his energy level since completing Pluvicto treatment.  Left leg edema has improved.  No left leg pain.  Objective:  Vital signs in last 24 hours:  Blood pressure (!) 141/70, pulse 67, temperature 98.1 F (36.7 C), temperature source Oral, resp. rate 18, height _0  (1.575 m), weight 130 lb 12.8 oz (59.3 kg), SpO2 100 %.    HEENT: No thrush Resp: Clear bilaterally Cardio: Regular rate and rhythm GI: Hepatosplenomegaly Vascular: Trace edema throughout the left leg.  No erythema or tenderness  Skin: Ecchymosis at the lower back (he reports a recent fall)  Portacath/PICC-without erythema  Lab Results:  Lab Results  Component Value Date   WBC 7.3 03/26/2022   HGB 10.5 (L) 03/26/2022   HCT 32.4 (L) 03/26/2022   MCV 93.1 03/26/2022   PLT 79 (L) 03/26/2022   NEUTROABS 4.4 03/26/2022    CMP  Lab Results  Component Value Date   NA 139 03/26/2022   K 4.0 03/26/2022   CL 107 03/26/2022   CO2 19 (L) 03/26/2022   GLUCOSE 101 (H) 03/26/2022   BUN 17 03/26/2022   CREATININE 0.43 (L) 03/26/2022   CALCIUM 7.1 (L) 03/26/2022   PROT 6.2 (L) 03/19/2022   ALBUMIN 3.4 (L) 03/19/2022   AST 17 03/19/2022   ALT <5 03/19/2022   ALKPHOS 176 (H) 03/19/2022   BILITOT 0.9 03/19/2022   GFRNONAA >60 03/26/2022   GFRAA 121 12/19/2017     Medications: I have reviewed the patient's current medications.   Assessment/Plan:  Metastatic prostate cancer - Lytic bone lesions with retroperitoneal adenopathy concerning for metastatic prostate cancer -03/03/2020-CTA chest/abdomen/pelvis widespread osseous metastatic disease and lower retroperitoneal adenopathy, pathologic right anterior third rib  fracture, probable spinal canal tumor at the level of S1, -MRI cervical, thoracic, and lumbar spine 03/03/2020-diffuse osseous metastatic disease, ventral epidural tumor impinging on right S1 nerve root, extraosseous tumor at the bilateral ilium, asymmetric enhancing material at the right C5-6 canal-right  -03/03/2020-PSA 763 -Degarelix 03/04/2020 -Abiraterone/prednisone 03/14/2020 -Every 64-monthLupron 04/01/2020 -04/01/2020 PSA 36.5 -05/31/2020 PSA 1.2 -06/28/2020 PSA 1.0 -12/14/2020 PSA 6.4 -01/13/2021 PSA 10.6 -04/26/2021 PSA  45.2 -05/16/2021 PSA 70 -Abiraterone/prednisone discontinued 05/16/2021 -05/16/2021 left hip x-ray-multifocal sclerotic metastasis throughout the pelvis, confluent involving the left acetabulum.  No acute or pathologic fracture.  Left hip osteoarthritis. -Cycle 1 docetaxel 05/31/2021 -05/31/2021 PSA 113 -Palliative radiation to the left acetabulum 06/08/2021 - 06/20/2021 -Cycle 2 docetaxel 06/21/2021 -06/21/2021 PSA improved at 68 -Cycle 3 docetaxel 07/12/2021 -07/12/2021 PSA improved at 52 -Cycle 4 docetaxel 08/02/2021 -Cycle 5 docetaxel 08/22/2021 -CT chest 09/16/2021-negative for pulmonary embolism, right liver mass, extensive bone metastases -Cycle 6 docetaxel 09/28/2021 -PSA increased 09/28/2021 -Olaparib 10/24/2021, patient decreased dose to 1 tablet twice daily on 10/31/2021 due to muscle spasms; dose resumed at prescribed amount 11/06/2021 -PSA increased 11/24/2021 -PSA further increased 12/08/2021 -Olaparib discontinued -PET PSMA scan 01/11/2022-widespread intensely radiotracer avid sclerotic skeletal metastasis involving the axillary and appendicular skeleton.  Small-volume periaortic retroperitoneal radiotracer avid prostate cancer nodal metastasis.  Nonspecific activity in the prostate gland.  No liver or lung prostate cancer metastasis. -Palliative radiation to the right pelvis and proximal right femur 02/01/2022 -Pluvicto 03/14/2022   2.  Severe anemia-likely secondary to  metastatic prostate cancer involving the bones 3.  Mild thrombocytopenia 4.  Cirrhosis with splenomegaly 5.  History of hepatitis C 6.  History of polysubstance abuse 7.  Depression 8.  Tobacco dependence 9.  Right arm weakness, right facial numbness-potentially related to nerve root compromise from metastatic bone lesions 10.  Pain secondary to #1 11.  Extensive bone metastases-every 69-monthZometa starting 04/01/2020 12.  Gout-acute flare right wrist 04/01/2020 treated with indomethacin, allopurinol resumed 13.  Right hepatic lobe mass with elevated AFP [facility] 12/[facility]-hypoenhancing inferior right liver mass, segment 6, occluding or compressing the adjacent right portal vein branch, intrahepatic cholangiocarcinoma favored with differential including hepatocellular carcinoma, Li-Rads M Y 90 planning study 11-22 Y90 right lobe of liver 06/26/2021 14.  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  [facility]   Disposition: Mr. MSchweglerhas experienced partial improvement in his performance status after completing treatment with Pluvicto.  He has persistent severe anemia and thrombocytopenia.  He declines a Red cell transfusion today.  He will return for a CBC and Red cell transfusion as needed in 1 week. The left leg is swollen today.  We will check a Doppler to rule out a deep vein thrombosis. He will continue MS Contin and oxycodone for pain.  I refilled his pain medication today.  Mr. MGranquistwill be scheduled for an office visit in 2 weeks.  He is scheduled for the next treatment with Pluvicto on 04/25/2021.  GBetsy Coder MD  04/06/2022  10:18 AM

## 2022-04-06 NOTE — Addendum Note (Signed)
Addended by: Velna Hatchet on: 04/06/2022 10:44 AM   Modules accepted: Orders

## 2022-04-11 ENCOUNTER — Inpatient Hospital Stay: Payer: BC Managed Care – PPO

## 2022-04-12 ENCOUNTER — Inpatient Hospital Stay: Payer: BC Managed Care – PPO

## 2022-04-13 ENCOUNTER — Other Ambulatory Visit: Payer: Self-pay | Admitting: Nurse Practitioner

## 2022-04-13 ENCOUNTER — Other Ambulatory Visit: Payer: Self-pay | Admitting: Oncology

## 2022-04-13 ENCOUNTER — Other Ambulatory Visit (HOSPITAL_BASED_OUTPATIENT_CLINIC_OR_DEPARTMENT_OTHER): Payer: Self-pay

## 2022-04-13 DIAGNOSIS — C61 Malignant neoplasm of prostate: Secondary | ICD-10-CM

## 2022-04-13 MED ORDER — OXYCODONE HCL 5 MG PO TABS
5.0000 mg | ORAL_TABLET | ORAL | 0 refills | Status: DC | PRN
  Filled 2022-04-13: qty 150, 9d supply, fill #0

## 2022-04-18 ENCOUNTER — Other Ambulatory Visit (HOSPITAL_BASED_OUTPATIENT_CLINIC_OR_DEPARTMENT_OTHER): Payer: Self-pay

## 2022-04-18 ENCOUNTER — Inpatient Hospital Stay: Payer: BC Managed Care – PPO | Attending: Nurse Practitioner

## 2022-04-18 ENCOUNTER — Encounter: Payer: Self-pay | Admitting: Nurse Practitioner

## 2022-04-18 ENCOUNTER — Inpatient Hospital Stay (HOSPITAL_BASED_OUTPATIENT_CLINIC_OR_DEPARTMENT_OTHER): Payer: BC Managed Care – PPO | Admitting: Nurse Practitioner

## 2022-04-18 VITALS — BP 145/88 | HR 79 | Temp 98.2°F | Resp 20 | Ht 62.0 in | Wt 126.0 lb

## 2022-04-18 DIAGNOSIS — C61 Malignant neoplasm of prostate: Secondary | ICD-10-CM

## 2022-04-18 DIAGNOSIS — K7460 Unspecified cirrhosis of liver: Secondary | ICD-10-CM | POA: Insufficient documentation

## 2022-04-18 DIAGNOSIS — Z8619 Personal history of other infectious and parasitic diseases: Secondary | ICD-10-CM | POA: Insufficient documentation

## 2022-04-18 DIAGNOSIS — F1911 Other psychoactive substance abuse, in remission: Secondary | ICD-10-CM | POA: Insufficient documentation

## 2022-04-18 DIAGNOSIS — R200 Anesthesia of skin: Secondary | ICD-10-CM | POA: Insufficient documentation

## 2022-04-18 DIAGNOSIS — I252 Old myocardial infarction: Secondary | ICD-10-CM | POA: Diagnosis not present

## 2022-04-18 DIAGNOSIS — F32A Depression, unspecified: Secondary | ICD-10-CM | POA: Insufficient documentation

## 2022-04-18 DIAGNOSIS — M1612 Unilateral primary osteoarthritis, left hip: Secondary | ICD-10-CM | POA: Insufficient documentation

## 2022-04-18 DIAGNOSIS — F17200 Nicotine dependence, unspecified, uncomplicated: Secondary | ICD-10-CM | POA: Diagnosis not present

## 2022-04-18 DIAGNOSIS — M109 Gout, unspecified: Secondary | ICD-10-CM | POA: Diagnosis not present

## 2022-04-18 DIAGNOSIS — R162 Hepatomegaly with splenomegaly, not elsewhere classified: Secondary | ICD-10-CM | POA: Insufficient documentation

## 2022-04-18 DIAGNOSIS — C7951 Secondary malignant neoplasm of bone: Secondary | ICD-10-CM | POA: Insufficient documentation

## 2022-04-18 DIAGNOSIS — D649 Anemia, unspecified: Secondary | ICD-10-CM | POA: Insufficient documentation

## 2022-04-18 LAB — SAMPLE TO BLOOD BANK

## 2022-04-18 LAB — CBC WITH DIFFERENTIAL (CANCER CENTER ONLY)
Abs Immature Granulocytes: 0.1 10*3/uL — ABNORMAL HIGH (ref 0.00–0.07)
Band Neutrophils: 3 %
Basophils Absolute: 0 10*3/uL (ref 0.0–0.1)
Basophils Relative: 1 %
Eosinophils Absolute: 0.1 10*3/uL (ref 0.0–0.5)
Eosinophils Relative: 4 %
HCT: 25.2 % — ABNORMAL LOW (ref 39.0–52.0)
Hemoglobin: 8.2 g/dL — ABNORMAL LOW (ref 13.0–17.0)
Lymphocytes Relative: 18 %
Lymphs Abs: 0.5 10*3/uL — ABNORMAL LOW (ref 0.7–4.0)
MCH: 30.6 pg (ref 26.0–34.0)
MCHC: 32.5 g/dL (ref 30.0–36.0)
MCV: 94 fL (ref 80.0–100.0)
Monocytes Absolute: 0 10*3/uL — ABNORMAL LOW (ref 0.1–1.0)
Monocytes Relative: 1 %
Myelocytes: 5 %
Neutro Abs: 1.9 10*3/uL (ref 1.7–7.7)
Neutrophils Relative %: 68 %
Platelet Count: 54 10*3/uL — ABNORMAL LOW (ref 150–400)
RBC: 2.68 MIL/uL — ABNORMAL LOW (ref 4.22–5.81)
RDW: 18.6 % — ABNORMAL HIGH (ref 11.5–15.5)
WBC Count: 2.7 10*3/uL — ABNORMAL LOW (ref 4.0–10.5)
nRBC: 2.2 % — ABNORMAL HIGH (ref 0.0–0.2)

## 2022-04-18 LAB — CMP (CANCER CENTER ONLY)
ALT: 7 U/L (ref 0–44)
AST: 12 U/L — ABNORMAL LOW (ref 15–41)
Albumin: 3.8 g/dL (ref 3.5–5.0)
Alkaline Phosphatase: 675 U/L — ABNORMAL HIGH (ref 38–126)
Anion gap: 10 (ref 5–15)
BUN: 10 mg/dL (ref 6–20)
CO2: 19 mmol/L — ABNORMAL LOW (ref 22–32)
Calcium: 8.3 mg/dL — ABNORMAL LOW (ref 8.9–10.3)
Chloride: 109 mmol/L (ref 98–111)
Creatinine: 0.39 mg/dL — ABNORMAL LOW (ref 0.61–1.24)
GFR, Estimated: >60 mL/min (ref >60)
Glucose, Bld: 102 mg/dL — ABNORMAL HIGH (ref 70–99)
Potassium: 4.4 mmol/L (ref 3.5–5.1)
Sodium: 138 mmol/L (ref 135–145)
Total Bilirubin: 1.1 mg/dL (ref 0.3–1.2)
Total Protein: 6.5 g/dL (ref 6.5–8.1)

## 2022-04-18 NOTE — Progress Notes (Signed)
[place] OFFICE PROGRESS NOTE   Diagnosis: Prostate cancer  INTERVAL HISTORY:   Edgar Mooney returns as scheduled.  Pain overall is better as compared to prior to the first Pluvicto.  No longer having hip pain.  Pain is mainly in his back.  He notes taking pain medication less frequently.  Bowels moving.  No nausea or vomiting.  Appetite has improved.  Objective:  Vital signs in last 24 hours:  Blood pressure (!) 145/88, pulse 79, temperature 98.2 F (36.8 C), temperature source Oral, resp. rate 20, height _0  (1.575 m), weight 126 lb (57.2 kg), SpO2 100 %.    HEENT: No thrush or ulcers. Resp: Lungs clear bilaterally. Cardio: Regular rate and rhythm. GI: Abdomen soft and nontender.  No hepatosplenomegaly. Vascular: No leg edema.   Lab Results:  Lab Results  Component Value Date   WBC 3.1 (L) 04/06/2022   HGB 7.8 (L) 04/06/2022   HCT 24.4 (L) 04/06/2022   MCV 93.8 04/06/2022   PLT 53 (L) 04/06/2022   NEUTROABS 2.1 04/06/2022    Imaging:  No results found.  Medications: I have reviewed the patient's current medications.  Assessment/Plan: Metastatic prostate cancer - Lytic bone lesions with retroperitoneal adenopathy concerning for metastatic prostate cancer -03/03/2020-CTA chest/abdomen/pelvis widespread osseous metastatic disease and lower retroperitoneal adenopathy, pathologic right anterior third rib fracture, probable spinal canal tumor at the level of S1, -MRI cervical, thoracic, and lumbar spine 03/03/2020-diffuse osseous metastatic disease, ventral epidural tumor impinging on right S1 nerve root, extraosseous tumor at the bilateral ilium, asymmetric enhancing material at the right C5-6 canal-right  -03/03/2020-PSA 763 -Degarelix 03/04/2020 -Abiraterone/prednisone 03/14/2020 -Every 70-monthLupron 04/01/2020 -04/01/2020 PSA 36.5 -05/31/2020 PSA 1.2 -06/28/2020 PSA 1.0 -12/14/2020 PSA 6.4 -01/13/2021 PSA 10.6 -04/26/2021 PSA  45.2 -05/16/2021  PSA 70 -Abiraterone/prednisone discontinued 05/16/2021 -05/16/2021 left hip x-ray-multifocal sclerotic metastasis throughout the pelvis, confluent involving the left acetabulum.  No acute or pathologic fracture.  Left hip osteoarthritis. -Cycle 1 docetaxel 05/31/2021 -05/31/2021 PSA 113 -Palliative radiation to the left acetabulum 06/08/2021 - 06/20/2021 -Cycle 2 docetaxel 06/21/2021 -06/21/2021 PSA improved at 68 -Cycle 3 docetaxel 07/12/2021 -07/12/2021 PSA improved at 52 -Cycle 4 docetaxel 08/02/2021 -Cycle 5 docetaxel 08/22/2021 -CT chest 09/16/2021-negative for pulmonary embolism, right liver mass, extensive bone metastases -Cycle 6 docetaxel 09/28/2021 -PSA increased 09/28/2021 -Olaparib 10/24/2021, patient decreased dose to 1 tablet twice daily on 10/31/2021 due to muscle spasms; dose resumed at prescribed amount 11/06/2021 -PSA increased 11/24/2021 -PSA further increased 12/08/2021 -Olaparib discontinued -PET PSMA scan 01/11/2022-widespread intensely radiotracer avid sclerotic skeletal metastasis involving the axillary and appendicular skeleton.  Small-volume periaortic retroperitoneal radiotracer avid prostate cancer nodal metastasis.  Nonspecific activity in the prostate gland.  No liver or lung prostate cancer metastasis. -Palliative radiation to the right pelvis and proximal right femur 02/01/2022 -Pluvicto 03/14/2022   2.  Severe anemia-likely secondary to metastatic prostate cancer involving the bones 3.  Mild thrombocytopenia 4.  Cirrhosis with splenomegaly 5.  History of hepatitis C 6.  History of polysubstance abuse 7.  Depression 8.  Tobacco dependence 9.  Right arm weakness, right facial numbness-potentially related to nerve root compromise from metastatic bone lesions 10.  Pain secondary to #1 11.  Extensive bone metastases-every 355-monthometa starting 04/01/2020 12.  Gout-acute flare right wrist 04/01/2020 treated with indomethacin, allopurinol resumed 13.  Right hepatic lobe mass with  elevated AFP [facility] 12/[facility]-hypoenhancing inferior right liver mass, segment 6, occluding or compressing the adjacent right portal vein branch, intrahepatic cholangiocarcinoma  favored with differential including hepatocellular carcinoma, Li-Rads M Y 90 planning study 11-22 Y90 right lobe of liver 06/26/2021 14.  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  Presentation [facility] emergency room 09/16/2021 with chest pain-transfer to [facility] on a morphine drip and scheduled Ativan    Disposition: Edgar Mooney appears stable.  He has completed 1 treatment with Pluvicto.  Next Pluvicto scheduled 04/25/2022.  He notes some improvement in pain.  Hemoglobin and platelets remain stable.  He continues oxycodone as needed.  We will follow-up on the PSA from today.  He will return for lab and follow-up in 2 weeks.  We are available to see him sooner if needed.    Ned Card ANP/GNP-BC   04/18/2022  1:42 PM

## 2022-04-19 ENCOUNTER — Telehealth: Payer: Self-pay

## 2022-04-19 LAB — PROSTATE-SPECIFIC AG, SERUM (LABCORP): Prostate Specific Ag, Serum: 160 ng/mL — ABNORMAL HIGH (ref 0.0–4.0)

## 2022-04-19 NOTE — Telephone Encounter (Signed)
-----   Message from Ladell Pier, MD sent at 04/19/2022  9:06 AM EST ----- Please call patient, the PSA is improved, follow-up as scheduled

## 2022-04-19 NOTE — Telephone Encounter (Signed)
Patient gave verbal understanding had no further questions or concerns. 

## 2022-04-23 ENCOUNTER — Other Ambulatory Visit: Payer: Self-pay | Admitting: Nurse Practitioner

## 2022-04-23 ENCOUNTER — Other Ambulatory Visit (HOSPITAL_BASED_OUTPATIENT_CLINIC_OR_DEPARTMENT_OTHER): Payer: Self-pay

## 2022-04-23 DIAGNOSIS — C61 Malignant neoplasm of prostate: Secondary | ICD-10-CM

## 2022-04-23 MED ORDER — OXYCODONE HCL 5 MG PO TABS
5.0000 mg | ORAL_TABLET | ORAL | 0 refills | Status: DC | PRN
  Filled 2022-04-23: qty 150, 15d supply, fill #0

## 2022-04-25 ENCOUNTER — Inpatient Hospital Stay (HOSPITAL_COMMUNITY)
Admission: RE | Admit: 2022-04-25 | Discharge: 2022-04-25 | Disposition: A | Discharge status: Home / Self Care | Payer: BC Managed Care – PPO | Source: Ambulatory Visit | Attending: Nurse Practitioner | Admitting: Nurse Practitioner

## 2022-04-25 DIAGNOSIS — C61 Malignant neoplasm of prostate: Secondary | ICD-10-CM

## 2022-04-25 NOTE — Written Directive (Addendum)
MOLECULAR IMAGING AND THERAPEUTICS WRITTEN DIRECTIVE   PATIENT NAME: Edgar Mooney  PT DOB:   1965/05/04                                              MRN: [id number]  ---------------------------------------------------------------------------------------------------------------------   PLUVICTO  THERAPY   RADIOPHARMACEUTICAL: Lutetium 177 vipivotide tetraxetan (Pluvicto)     PRESCRIBED DOSE FOR ADMINISTRATION:  200 mCi   ROUTE OFADMINISTRATION:  IV   DIAGNOSIS:  Prostate Cancer    REFERRING PHYSICIAN: Ned Card, NP   TREATMENT #: 2   ADDITIONAL PHYSICIAN COMMENTS/NOTES:   AUTHORIZED USER SIGNATURE & TIME STAMP: Rennis Golden, MD   04/25/22    7:57 AM

## 2022-04-26 ENCOUNTER — Ambulatory Visit (HOSPITAL_COMMUNITY)
Admission: RE | Admit: 2022-04-26 | Discharge: 2022-04-26 | Disposition: A | Discharge status: Home / Self Care | Payer: BC Managed Care – PPO | Source: Ambulatory Visit | Attending: Nurse Practitioner | Admitting: Nurse Practitioner

## 2022-04-26 DIAGNOSIS — C61 Malignant neoplasm of prostate: Secondary | ICD-10-CM | POA: Insufficient documentation

## 2022-04-26 MED ORDER — LUTETIUM LU 177 VIPIVOTIDE TET 1000 MBQ/ML IV SOLN
155.9000 | Freq: Once | INTRAVENOUS | Status: AC
  Administered 2022-04-26: 155.9 via INTRAVENOUS

## 2022-04-26 MED ORDER — SODIUM CHLORIDE 0.9 % IV BOLUS: 1000.0000 mL | Freq: Once | INTRAVENOUS | Status: DC

## 2022-04-26 NOTE — Written Directive (Addendum)
MOLECULAR IMAGING AND THERAPEUTICS WRITTEN DIRECTIVE   PATIENT NAME: Edgar Mooney  PT DOB:   01/19/1966                                              MRN: [id number]  ---------------------------------------------------------------------------------------------------------------------   PLUVICTO  THERAPY   RADIOPHARMACEUTICAL: Lutetium 177 vipivotide tetraxetan (Pluvicto)     PRESCRIBED DOSE FOR ADMINISTRATION:  150 mCi   ROUTE OFADMINISTRATION:  IV   DIAGNOSIS:  Prostate cancer   REFERRING PHYSICIAN: Ned Card   TREATMENT #: 2   ADDITIONAL PHYSICIAN COMMENTS/NOTES:   AUTHORIZED USER SIGNATURE & TIME STAMP: Rennis Golden, MD   04/26/22    9:06 AM

## 2022-04-30 ENCOUNTER — Inpatient Hospital Stay: Payer: BC Managed Care – PPO

## 2022-04-30 NOTE — Progress Notes (Signed)
Nutrition Follow-up:   Patient with prostate cancer and hepatocellular carcinoma.  Patient receiving pluvicto.    Spoke with patient via phone for nutrition follow-up.  Patient reports that appetite is better.  Ate whole chicken sandwich except for 1-2 bites for lunch.  Breakfast was grits, eggs, sausage bowl.  Drinks ensure shakes. Says that he has issues with nausea but medication helps.  Says that he eats when he is hungry.      Medications: reviewed  Labs: reviewed  Anthropometrics:   Weight 126 lb on 04/18/22 123 lb 12.8 oz on 12/4 144 lb on 11/2 (swelling) 158 lb on 5/9   NUTRITION DIAGNOSIS: Unintentional weight loss stable    INTERVENTION:  Continue oral nutrition supplements, 350 + calorie or more.  Encouraged good sources of protein at every meal Encouraged eating q 2 hours    MONITORING, EVALUATION, GOAL: weight trends, intake   NEXT VISIT: Monday, Feb 12 phone call  Edgar Mooney B. Zenia Resides, [place], [place] Registered Dietitian [phone]

## 2022-05-03 ENCOUNTER — Other Ambulatory Visit: Payer: Self-pay | Admitting: Nurse Practitioner

## 2022-05-03 ENCOUNTER — Inpatient Hospital Stay: Payer: BC Managed Care – PPO

## 2022-05-03 ENCOUNTER — Inpatient Hospital Stay (HOSPITAL_BASED_OUTPATIENT_CLINIC_OR_DEPARTMENT_OTHER): Payer: BC Managed Care – PPO | Admitting: Nurse Practitioner

## 2022-05-03 ENCOUNTER — Telehealth: Payer: Self-pay

## 2022-05-03 ENCOUNTER — Other Ambulatory Visit (HOSPITAL_BASED_OUTPATIENT_CLINIC_OR_DEPARTMENT_OTHER): Payer: Self-pay

## 2022-05-03 ENCOUNTER — Encounter: Payer: Self-pay | Admitting: Nurse Practitioner

## 2022-05-03 VITALS — BP 127/60 | HR 78 | Temp 98.2°F | Resp 18 | Wt 123.6 lb

## 2022-05-03 DIAGNOSIS — M109 Gout, unspecified: Secondary | ICD-10-CM | POA: Diagnosis not present

## 2022-05-03 DIAGNOSIS — R200 Anesthesia of skin: Secondary | ICD-10-CM | POA: Diagnosis not present

## 2022-05-03 DIAGNOSIS — R162 Hepatomegaly with splenomegaly, not elsewhere classified: Secondary | ICD-10-CM | POA: Diagnosis not present

## 2022-05-03 DIAGNOSIS — M1612 Unilateral primary osteoarthritis, left hip: Secondary | ICD-10-CM | POA: Diagnosis not present

## 2022-05-03 DIAGNOSIS — I252 Old myocardial infarction: Secondary | ICD-10-CM | POA: Diagnosis not present

## 2022-05-03 DIAGNOSIS — K7460 Unspecified cirrhosis of liver: Secondary | ICD-10-CM | POA: Diagnosis not present

## 2022-05-03 DIAGNOSIS — F17200 Nicotine dependence, unspecified, uncomplicated: Secondary | ICD-10-CM | POA: Diagnosis not present

## 2022-05-03 DIAGNOSIS — R160 Hepatomegaly, not elsewhere classified: Secondary | ICD-10-CM | POA: Diagnosis not present

## 2022-05-03 DIAGNOSIS — F1911 Other psychoactive substance abuse, in remission: Secondary | ICD-10-CM | POA: Diagnosis not present

## 2022-05-03 DIAGNOSIS — C61 Malignant neoplasm of prostate: Secondary | ICD-10-CM

## 2022-05-03 DIAGNOSIS — F32A Depression, unspecified: Secondary | ICD-10-CM | POA: Diagnosis not present

## 2022-05-03 DIAGNOSIS — Z8619 Personal history of other infectious and parasitic diseases: Secondary | ICD-10-CM | POA: Diagnosis not present

## 2022-05-03 DIAGNOSIS — D649 Anemia, unspecified: Secondary | ICD-10-CM | POA: Diagnosis not present

## 2022-05-03 DIAGNOSIS — C7951 Secondary malignant neoplasm of bone: Secondary | ICD-10-CM | POA: Diagnosis not present

## 2022-05-03 LAB — CBC WITH DIFFERENTIAL (CANCER CENTER ONLY)
Abs Immature Granulocytes: 0.3 10*3/uL — ABNORMAL HIGH (ref 0.00–0.07)
Band Neutrophils: 4 %
Basophils Absolute: 0 10*3/uL (ref 0.0–0.1)
Basophils Relative: 0 %
Eosinophils Absolute: 0.1 10*3/uL (ref 0.0–0.5)
Eosinophils Relative: 2 %
HCT: 22.2 % — ABNORMAL LOW (ref 39.0–52.0)
Hemoglobin: 7.3 g/dL — ABNORMAL LOW (ref 13.0–17.0)
Lymphocytes Relative: 8 %
Lymphs Abs: 0.2 10*3/uL — ABNORMAL LOW (ref 0.7–4.0)
MCH: 30.9 pg (ref 26.0–34.0)
MCHC: 32.9 g/dL (ref 30.0–36.0)
MCV: 94.1 fL (ref 80.0–100.0)
Metamyelocytes Relative: 4 %
Monocytes Absolute: 0.1 10*3/uL (ref 0.1–1.0)
Monocytes Relative: 4 %
Myelocytes: 6 %
Neutro Abs: 2 10*3/uL (ref 1.7–7.7)
Neutrophils Relative %: 72 %
Platelet Count: 74 10*3/uL — ABNORMAL LOW (ref 150–400)
RBC: 2.36 MIL/uL — ABNORMAL LOW (ref 4.22–5.81)
RDW: 18.3 % — ABNORMAL HIGH (ref 11.5–15.5)
WBC Count: 2.6 10*3/uL — ABNORMAL LOW (ref 4.0–10.5)
nRBC: 3.1 % — ABNORMAL HIGH (ref 0.0–0.2)

## 2022-05-03 LAB — CMP (CANCER CENTER ONLY)
ALT: <5 U/L (ref 0–44)
AST: 10 U/L — ABNORMAL LOW (ref 15–41)
Albumin: 3.7 g/dL (ref 3.5–5.0)
Alkaline Phosphatase: 495 U/L — ABNORMAL HIGH (ref 38–126)
Anion gap: 8 (ref 5–15)
BUN: 11 mg/dL (ref 6–20)
CO2: 20 mmol/L — ABNORMAL LOW (ref 22–32)
Calcium: 8.4 mg/dL — ABNORMAL LOW (ref 8.9–10.3)
Chloride: 110 mmol/L (ref 98–111)
Creatinine: 0.33 mg/dL — ABNORMAL LOW (ref 0.61–1.24)
GFR, Estimated: >60 mL/min (ref >60)
Glucose, Bld: 112 mg/dL — ABNORMAL HIGH (ref 70–99)
Potassium: 4 mmol/L (ref 3.5–5.1)
Sodium: 138 mmol/L (ref 135–145)
Total Bilirubin: 0.9 mg/dL (ref 0.3–1.2)
Total Protein: 6 g/dL — ABNORMAL LOW (ref 6.5–8.1)

## 2022-05-03 LAB — PREPARE RBC (CROSSMATCH)

## 2022-05-03 LAB — SAMPLE TO BLOOD BANK

## 2022-05-03 MED ORDER — OXYCODONE HCL 5 MG PO TABS
5.0000 mg | ORAL_TABLET | ORAL | 0 refills | Status: DC | PRN
  Filled 2022-05-03: qty 150, 13d supply, fill #0

## 2022-05-03 NOTE — Progress Notes (Signed)
[facility] OFFICE PROGRESS NOTE   Diagnosis: Prostate cancer  INTERVAL HISTORY:   Mr. Heider returns as scheduled.  He completed a second Pluvicto treatment 04/26/2022.  Pain in general is better.  He continues to have back pain.  He takes oxycodone as needed.  Bowels are moving.  No nausea or vomiting.  He reports numbness of the lower lip for the past month or so.  Objective:  Vital signs in last 24 hours:  Blood pressure 127/60, pulse 78, temperature 98.2 F (36.8 C), temperature source Tympanic, resp. rate 18, weight 123 lb 9.6 oz (56.1 kg), SpO2 100 %.    HEENT: No thrush or ulcers. Resp: Lungs clear bilaterally. Cardio: Regular rate and rhythm. GI: Abdomen soft and nontender.  No hepatosplenomegaly. Vascular: No leg edema. Neuro: Alert and oriented.  Moving all extremities.  Extraocular movements intact.  Face is symmetric.  Tongue midline.   Lab Results:  Lab Results  Component Value Date   WBC 2.6 (L) 05/03/2022   HGB 7.3 (L) 05/03/2022   HCT 22.2 (L) 05/03/2022   MCV 94.1 05/03/2022   PLT 74 (L) 05/03/2022   NEUTROABS PENDING 05/03/2022    Imaging:  No results found.  Medications: I have reviewed the patient's current medications.  Assessment/Plan: Metastatic prostate cancer - Lytic bone lesions with retroperitoneal adenopathy concerning for metastatic prostate cancer -03/03/2020-CTA chest/abdomen/pelvis widespread osseous metastatic disease and lower retroperitoneal adenopathy, pathologic right anterior third rib fracture, probable spinal canal tumor at the level of S1, -MRI cervical, thoracic, and lumbar spine 03/03/2020-diffuse osseous metastatic disease, ventral epidural tumor impinging on right S1 nerve root, extraosseous tumor at the bilateral ilium, asymmetric enhancing material at the right C5-6 canal-right  -03/03/2020-PSA 763 -Degarelix 03/04/2020 -Abiraterone/prednisone 03/14/2020 -Every 57-monthLupron 04/01/2020 -04/01/2020  PSA 36.5 -05/31/2020 PSA 1.2 -06/28/2020 PSA 1.0 -12/14/2020 PSA 6.4 -01/13/2021 PSA 10.6 -04/26/2021 PSA  45.2 -05/16/2021 PSA 70 -Abiraterone/prednisone discontinued 05/16/2021 -05/16/2021 left hip x-ray-multifocal sclerotic metastasis throughout the pelvis, confluent involving the left acetabulum.  No acute or pathologic fracture.  Left hip osteoarthritis. -Cycle 1 docetaxel 05/31/2021 -05/31/2021 PSA 113 -Palliative radiation to the left acetabulum 06/08/2021 - 06/20/2021 -Cycle 2 docetaxel 06/21/2021 -06/21/2021 PSA improved at 68 -Cycle 3 docetaxel 07/12/2021 -07/12/2021 PSA improved at 52 -Cycle 4 docetaxel 08/02/2021 -Cycle 5 docetaxel 08/22/2021 -CT chest 09/16/2021-negative for pulmonary embolism, right liver mass, extensive bone metastases -Cycle 6 docetaxel 09/28/2021 -PSA increased 09/28/2021 -Olaparib 10/24/2021, patient decreased dose to 1 tablet twice daily on 10/31/2021 due to muscle spasms; dose resumed at prescribed amount 11/06/2021 -PSA increased 11/24/2021 -PSA further increased 12/08/2021 -Olaparib discontinued -PET PSMA scan 01/11/2022-widespread intensely radiotracer avid sclerotic skeletal metastasis involving the axillary and appendicular skeleton.  Small-volume periaortic retroperitoneal radiotracer avid prostate cancer nodal metastasis.  Nonspecific activity in the prostate gland.  No liver or lung prostate cancer metastasis. -Palliative radiation to the right pelvis and proximal right femur 02/01/2022 -Pluvicto 03/14/2022 -PSA markedly improved at 160 on 04/18/2022 -Pluvicto 04/26/2022 2.  Severe anemia-likely secondary to metastatic prostate cancer involving the bones 3.  Mild thrombocytopenia 4.  Cirrhosis with splenomegaly 5.  History of hepatitis C 6.  History of polysubstance abuse 7.  Depression 8.  Tobacco dependence 9.  Right arm weakness, right facial numbness-potentially related to nerve root compromise from metastatic bone lesions 10.  Pain secondary to #1 11.  Extensive  bone metastases-every 320-monthometa starting 04/01/2020 12.  Gout-acute flare right wrist 04/01/2020 treated with indomethacin, allopurinol resumed 13.  Right hepatic lobe mass  with elevated AFP concerning for [facility] MRI abdomen 12/21/2020-hypoenhancing inferior right liver mass, segment 6, occluding or compressing the adjacent right portal vein branch, intrahepatic cholangiocarcinoma favored with differential including hepatocellular carcinoma, Li-Rads M Y 90 planning study 11-22 Y90 right lobe of liver 06/26/2021 14.  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  Presentation to the [facility] emergency room 09/16/2021 with chest pain-transfer to [facility] on a morphine drip and scheduled Ativan  Disposition: Mr. Vanvranken appears stable.  The PSA was markedly improved following the first Pluvicto treatment.  He completed the second Pluvicto treatment 04/26/2022.  Performance status continues to be improved.  He continues oxycodone for pain.  CBC reviewed.  Counts are stable.  He has progressive anemia.  We are making arrangements for a blood transfusion.  We will follow-up on the PSA from today.  He will return for lab and follow-up 05/15/2022.  We are available to see him sooner if needed.   Ned Card ANP/GNP-BC   05/03/2022  1:33 PM

## 2022-05-03 NOTE — Telephone Encounter (Signed)
RBCS order are in, dash has been called and [place] confirm the order are in.

## 2022-05-04 ENCOUNTER — Inpatient Hospital Stay: Payer: BC Managed Care – PPO

## 2022-05-04 DIAGNOSIS — M1612 Unilateral primary osteoarthritis, left hip: Secondary | ICD-10-CM | POA: Diagnosis not present

## 2022-05-04 DIAGNOSIS — D649 Anemia, unspecified: Secondary | ICD-10-CM | POA: Diagnosis not present

## 2022-05-04 DIAGNOSIS — F32A Depression, unspecified: Secondary | ICD-10-CM | POA: Diagnosis not present

## 2022-05-04 DIAGNOSIS — C61 Malignant neoplasm of prostate: Secondary | ICD-10-CM

## 2022-05-04 DIAGNOSIS — I252 Old myocardial infarction: Secondary | ICD-10-CM | POA: Diagnosis not present

## 2022-05-04 DIAGNOSIS — F1911 Other psychoactive substance abuse, in remission: Secondary | ICD-10-CM | POA: Diagnosis not present

## 2022-05-04 DIAGNOSIS — R160 Hepatomegaly, not elsewhere classified: Secondary | ICD-10-CM

## 2022-05-04 DIAGNOSIS — Z8619 Personal history of other infectious and parasitic diseases: Secondary | ICD-10-CM | POA: Diagnosis not present

## 2022-05-04 DIAGNOSIS — F17200 Nicotine dependence, unspecified, uncomplicated: Secondary | ICD-10-CM | POA: Diagnosis not present

## 2022-05-04 DIAGNOSIS — C7951 Secondary malignant neoplasm of bone: Secondary | ICD-10-CM | POA: Diagnosis not present

## 2022-05-04 DIAGNOSIS — R200 Anesthesia of skin: Secondary | ICD-10-CM | POA: Diagnosis not present

## 2022-05-04 DIAGNOSIS — M109 Gout, unspecified: Secondary | ICD-10-CM | POA: Diagnosis not present

## 2022-05-04 DIAGNOSIS — R162 Hepatomegaly with splenomegaly, not elsewhere classified: Secondary | ICD-10-CM | POA: Diagnosis not present

## 2022-05-04 DIAGNOSIS — K7460 Unspecified cirrhosis of liver: Secondary | ICD-10-CM | POA: Diagnosis not present

## 2022-05-04 MED ORDER — SODIUM CHLORIDE 0.9% IV SOLUTION
250.0000 mL | Freq: Once | INTRAVENOUS | Status: AC
  Administered 2022-05-04: 250 mL via INTRAVENOUS

## 2022-05-04 NOTE — Patient Instructions (Signed)

## 2022-05-07 LAB — BPAM RBC
Blood Product Expiration Date: 202402142359
Blood Product Expiration Date: 202402152359
ISSUE DATE / TIME: 202401190655
ISSUE DATE / TIME: 202401190655
Unit Type and Rh: 6200
Unit Type and Rh: 6200

## 2022-05-07 LAB — TYPE AND SCREEN
ABO/RH(D): A POS
Antibody Screen: NEGATIVE
Unit division: 0
Unit division: 0

## 2022-05-15 ENCOUNTER — Inpatient Hospital Stay: Payer: BC Managed Care – PPO

## 2022-05-15 ENCOUNTER — Inpatient Hospital Stay: Payer: BC Managed Care – PPO | Admitting: Oncology

## 2022-05-15 ENCOUNTER — Telehealth: Payer: Self-pay | Admitting: *Deleted

## 2022-05-15 NOTE — Telephone Encounter (Signed)
Called patient to determine if he was en route to his appointment today. He reports he did not have a ride and when he called to schedule ride, VM said transport coordinator was out till Thursday and to reach out to provider (which he did not). Informed him he will be called to reschedule.

## 2022-05-16 ENCOUNTER — Other Ambulatory Visit (HOSPITAL_BASED_OUTPATIENT_CLINIC_OR_DEPARTMENT_OTHER): Payer: Self-pay

## 2022-05-16 ENCOUNTER — Other Ambulatory Visit: Payer: Self-pay | Admitting: Nurse Practitioner

## 2022-05-16 DIAGNOSIS — C61 Malignant neoplasm of prostate: Secondary | ICD-10-CM

## 2022-05-16 MED ORDER — OXYCODONE HCL 5 MG PO TABS
5.0000 mg | ORAL_TABLET | ORAL | 0 refills | Status: DC | PRN
  Filled 2022-05-16: qty 150, 13d supply, fill #0

## 2022-05-17 ENCOUNTER — Other Ambulatory Visit: Payer: Self-pay

## 2022-05-21 ENCOUNTER — Other Ambulatory Visit: Payer: Self-pay | Admitting: *Deleted

## 2022-05-21 DIAGNOSIS — C61 Malignant neoplasm of prostate: Secondary | ICD-10-CM

## 2022-05-21 NOTE — Progress Notes (Signed)
Orders placed for Pre-Pluvicto labs due on 06/09/22

## 2022-05-24 ENCOUNTER — Inpatient Hospital Stay: Payer: BC Managed Care – PPO | Attending: Nurse Practitioner

## 2022-05-24 ENCOUNTER — Inpatient Hospital Stay: Payer: BC Managed Care – PPO

## 2022-05-24 ENCOUNTER — Inpatient Hospital Stay (HOSPITAL_BASED_OUTPATIENT_CLINIC_OR_DEPARTMENT_OTHER): Payer: BC Managed Care – PPO | Admitting: Nurse Practitioner

## 2022-05-24 ENCOUNTER — Encounter: Payer: Self-pay | Admitting: Nurse Practitioner

## 2022-05-24 VITALS — BP 138/75 | HR 72 | Temp 98.1°F | Resp 18 | Ht 62.0 in | Wt 127.0 lb

## 2022-05-24 VITALS — BP 130/70 | HR 75 | Temp 98.6°F | Resp 18

## 2022-05-24 DIAGNOSIS — R162 Hepatomegaly with splenomegaly, not elsewhere classified: Secondary | ICD-10-CM | POA: Insufficient documentation

## 2022-05-24 DIAGNOSIS — F32A Depression, unspecified: Secondary | ICD-10-CM | POA: Diagnosis not present

## 2022-05-24 DIAGNOSIS — C786 Secondary malignant neoplasm of retroperitoneum and peritoneum: Secondary | ICD-10-CM | POA: Insufficient documentation

## 2022-05-24 DIAGNOSIS — Z8619 Personal history of other infectious and parasitic diseases: Secondary | ICD-10-CM | POA: Diagnosis not present

## 2022-05-24 DIAGNOSIS — F1911 Other psychoactive substance abuse, in remission: Secondary | ICD-10-CM | POA: Insufficient documentation

## 2022-05-24 DIAGNOSIS — M1612 Unilateral primary osteoarthritis, left hip: Secondary | ICD-10-CM | POA: Insufficient documentation

## 2022-05-24 DIAGNOSIS — K7460 Unspecified cirrhosis of liver: Secondary | ICD-10-CM | POA: Diagnosis not present

## 2022-05-24 DIAGNOSIS — I252 Old myocardial infarction: Secondary | ICD-10-CM | POA: Diagnosis not present

## 2022-05-24 DIAGNOSIS — Z5111 Encounter for antineoplastic chemotherapy: Secondary | ICD-10-CM | POA: Diagnosis not present

## 2022-05-24 DIAGNOSIS — C61 Malignant neoplasm of prostate: Secondary | ICD-10-CM

## 2022-05-24 DIAGNOSIS — F17200 Nicotine dependence, unspecified, uncomplicated: Secondary | ICD-10-CM | POA: Diagnosis not present

## 2022-05-24 DIAGNOSIS — M109 Gout, unspecified: Secondary | ICD-10-CM | POA: Diagnosis not present

## 2022-05-24 DIAGNOSIS — D649 Anemia, unspecified: Secondary | ICD-10-CM | POA: Insufficient documentation

## 2022-05-24 DIAGNOSIS — C7951 Secondary malignant neoplasm of bone: Secondary | ICD-10-CM | POA: Diagnosis not present

## 2022-05-24 LAB — CMP (CANCER CENTER ONLY)
ALT: 16 U/L (ref 0–44)
AST: 12 U/L — ABNORMAL LOW (ref 15–41)
Albumin: 4 g/dL (ref 3.5–5.0)
Alkaline Phosphatase: 365 U/L — ABNORMAL HIGH (ref 38–126)
Anion gap: 8 (ref 5–15)
BUN: 17 mg/dL (ref 6–20)
CO2: 20 mmol/L — ABNORMAL LOW (ref 22–32)
Calcium: 8.8 mg/dL — ABNORMAL LOW (ref 8.9–10.3)
Chloride: 106 mmol/L (ref 98–111)
Creatinine: 0.46 mg/dL — ABNORMAL LOW (ref 0.61–1.24)
GFR, Estimated: >60 mL/min (ref >60)
Glucose, Bld: 126 mg/dL — ABNORMAL HIGH (ref 70–99)
Potassium: 4.4 mmol/L (ref 3.5–5.1)
Sodium: 134 mmol/L — ABNORMAL LOW (ref 135–145)
Total Bilirubin: 0.8 mg/dL (ref 0.3–1.2)
Total Protein: 6.6 g/dL (ref 6.5–8.1)

## 2022-05-24 LAB — CBC WITH DIFFERENTIAL (CANCER CENTER ONLY)
Abs Immature Granulocytes: 0.13 10*3/uL — ABNORMAL HIGH (ref 0.00–0.07)
Basophils Absolute: 0 10*3/uL (ref 0.0–0.1)
Basophils Relative: 1 %
Eosinophils Absolute: 0 10*3/uL (ref 0.0–0.5)
Eosinophils Relative: 1 %
HCT: 30.5 % — ABNORMAL LOW (ref 39.0–52.0)
Hemoglobin: 10 g/dL — ABNORMAL LOW (ref 13.0–17.0)
Immature Granulocytes: 3 %
Lymphocytes Relative: 10 %
Lymphs Abs: 0.4 10*3/uL — ABNORMAL LOW (ref 0.7–4.0)
MCH: 29.9 pg (ref 26.0–34.0)
MCHC: 32.8 g/dL (ref 30.0–36.0)
MCV: 91 fL (ref 80.0–100.0)
Monocytes Absolute: 0.3 10*3/uL (ref 0.1–1.0)
Monocytes Relative: 9 %
Neutro Abs: 3 10*3/uL (ref 1.7–7.7)
Neutrophils Relative %: 76 %
Platelet Count: 63 10*3/uL — ABNORMAL LOW (ref 150–400)
RBC: 3.35 MIL/uL — ABNORMAL LOW (ref 4.22–5.81)
RDW: 19.6 % — ABNORMAL HIGH (ref 11.5–15.5)
WBC Count: 3.9 10*3/uL — ABNORMAL LOW (ref 4.0–10.5)
nRBC: 0.8 % — ABNORMAL HIGH (ref 0.0–0.2)

## 2022-05-24 LAB — SAMPLE TO BLOOD BANK

## 2022-05-24 MED ORDER — SODIUM CHLORIDE 0.9 % IV SOLN: Freq: Once | INTRAVENOUS | Status: DC

## 2022-05-24 MED ORDER — LEUPROLIDE ACETATE (3 MONTH) 22.5 MG IM KIT
22.5000 mg | PACK | Freq: Once | INTRAMUSCULAR | Status: AC
  Filled 2022-05-24: qty 22.5

## 2022-05-24 MED ORDER — ZOLEDRONIC ACID 4 MG/100ML IV SOLN
4.0000 mg | Freq: Once | INTRAVENOUS | Status: AC
  Administered 2022-05-24: 4 mg via INTRAVENOUS
  Filled 2022-05-24: qty 100

## 2022-05-24 MED ORDER — SODIUM CHLORIDE 0.9 % IV SOLN: INTRAVENOUS | Status: DC

## 2022-05-24 MED ORDER — LEUPROLIDE ACETATE (3 MONTH) 22.5 MG ~~LOC~~ KIT
22.5000 mg | PACK | Freq: Once | SUBCUTANEOUS | Status: AC
  Administered 2022-05-24: 22.5 mg via SUBCUTANEOUS
  Filled 2022-05-24: qty 22.5

## 2022-05-24 NOTE — Progress Notes (Signed)
[place] OFFICE PROGRESS NOTE   Diagnosis: Prostate cancer  INTERVAL HISTORY:   Edgar Mooney returns for follow-up.  He completed a second Pluvicto treatment 04/26/2022.  Pain continues to be in removed.  He discontinued MS Contin about 7 days ago.  He continues frequent oxycodone for low back pain.  No nausea or vomiting.  Bowels are moving.  He notes difficulty sleeping.  No mouth, tooth, jaw, gum pain.  Objective:  Vital signs in last 24 hours:  Blood pressure 138/75, pulse 72, temperature 98.1 F (36.7 C), temperature source Oral, resp. rate 18, height '5\' 2"'$  (1.575 m), weight 127 lb (57.6 kg), SpO2 98 %.    HEENT: No thrush or ulcers. Resp: Lungs clear bilaterally. Cardio: Regular rate and rhythm. GI: No hepatosplenomegaly. Vascular: No leg edema.  Lab Results:  Lab Results  Component Value Date   WBC 3.9 (L) 05/24/2022   HGB 10.0 (L) 05/24/2022   HCT 30.5 (L) 05/24/2022   MCV 91.0 05/24/2022   PLT 63 (L) 05/24/2022   NEUTROABS 3.0 05/24/2022    Imaging:  No results found.  Medications: I have reviewed the patient's current medications.  Assessment/Plan: Metastatic prostate cancer - Lytic bone lesions with retroperitoneal adenopathy concerning for metastatic prostate cancer -03/03/2020-CTA chest/abdomen/pelvis widespread osseous metastatic disease and lower retroperitoneal adenopathy, pathologic right anterior third rib fracture, probable spinal canal tumor at the level of S1, -MRI cervical, thoracic, and lumbar spine 03/03/2020-diffuse osseous metastatic disease, ventral epidural tumor impinging on right S1 nerve root, extraosseous tumor at the bilateral ilium, asymmetric enhancing material at the right C5-6 canal-right  -03/03/2020-PSA 763 -Degarelix 03/04/2020 -Abiraterone/prednisone 03/14/2020 -Every 37-monthLupron 04/01/2020 -04/01/2020 PSA 36.5 -05/31/2020 PSA 1.2 -06/28/2020 PSA 1.0 -12/14/2020 PSA 6.4 -01/13/2021 PSA 10.6 -04/26/2021 PSA   45.2 -05/16/2021 PSA 70 -Abiraterone/prednisone discontinued 05/16/2021 -05/16/2021 left hip x-ray-multifocal sclerotic metastasis throughout the pelvis, confluent involving the left acetabulum.  No acute or pathologic fracture.  Left hip osteoarthritis. -Cycle 1 docetaxel 05/31/2021 -05/31/2021 PSA 113 -Palliative radiation to the left acetabulum 06/08/2021 - 06/20/2021 -Cycle 2 docetaxel 06/21/2021 -06/21/2021 PSA improved at 68 -Cycle 3 docetaxel 07/12/2021 -07/12/2021 PSA improved at 52 -Cycle 4 docetaxel 08/02/2021 -Cycle 5 docetaxel 08/22/2021 -CT chest 09/16/2021-negative for pulmonary embolism, right liver mass, extensive bone metastases -Cycle 6 docetaxel 09/28/2021 -PSA increased 09/28/2021 -Olaparib 10/24/2021, patient decreased dose to 1 tablet twice daily on 10/31/2021 due to muscle spasms; dose resumed at prescribed amount 11/06/2021 -PSA increased 11/24/2021 -PSA further increased 12/08/2021 -Olaparib discontinued -PET PSMA scan 01/11/2022-widespread intensely radiotracer avid sclerotic skeletal metastasis involving the axillary and appendicular skeleton.  Small-volume periaortic retroperitoneal radiotracer avid prostate cancer nodal metastasis.  Nonspecific activity in the prostate gland.  No liver or lung prostate cancer metastasis. -Palliative radiation to the right pelvis and proximal right femur 02/01/2022 -Pluvicto 03/14/2022 -PSA markedly improved at 160 on 04/18/2022 -Pluvicto 04/26/2022 2.  Severe anemia-likely secondary to metastatic prostate cancer involving the bones 3.  Mild thrombocytopenia 4.  Cirrhosis with splenomegaly 5.  History of hepatitis C 6.  History of polysubstance abuse 7.  Depression 8.  Tobacco dependence 9.  Right arm weakness, right facial numbness-potentially related to nerve root compromise from metastatic bone lesions 10.  Pain secondary to #1 11.  Extensive bone metastases-every 372-monthometa starting 04/01/2020 12.  Gout-acute flare right wrist 04/01/2020  treated with indomethacin, allopurinol resumed 13.  Right hepatic lobe mass with elevated AFP [facility]-hypoenhancing inferior right liver mass, segment 6, occluding or compressing  the adjacent right portal vein branch, intrahepatic cholangiocarcinoma favored with differential including hepatocellular carcinoma, Li-Rads M Y 90 planning study 11-22 Y90 right lobe of liver 06/26/2021 14.  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  Presentation to the [facility] emergency room 09/16/2021 with chest pain-transfer to [facility] on a morphine drip and scheduled Ativan  Disposition: Edgar Mooney appears stable.  He has completed 2 treatments with Pluvicto.  We will follow-up on the PSA from today.  Next Pluvicto scheduled 06/06/2022.  He is due for a Lupron injection and Zometa infusion today.    Pain continues to be improved.  He will work on decreasing use of oxycodone.  He will return for lab and follow-up the week of 06/18/2022.     Ned Card ANP/GNP-BC   05/24/2022  10:48 AM

## 2022-05-24 NOTE — Progress Notes (Signed)
Patient seen by Lisa Thomas NP today  Vitals are within treatment parameters.  Labs reviewed by Lisa Thomas NP and are within treatment parameters.  Per physician team, patient is ready for treatment and there are NO modifications to the treatment plan.     

## 2022-05-24 NOTE — Patient Instructions (Signed)
Zoledronic Acid Injection (Cancer) What is this medication? ZOLEDRONIC ACID (ZOE le dron ik AS id) treats high calcium levels in the blood caused by cancer. It may also be used with chemotherapy to treat weakened bones caused by cancer. It works by slowing down the release of calcium from bones. This lowers calcium levels in your blood. It also makes your bones stronger and less likely to break (fracture). It belongs to a group of medications called bisphosphonates. This medicine may be used for other purposes; ask your health care provider or pharmacist if you have questions. COMMON BRAND NAME(S): Zometa, Zometa Powder What should I tell my care team before I take this medication? They need to know if you have any of these conditions: Dehydration Dental disease Kidney disease Liver disease Low levels of calcium in the blood Lung or breathing disease, such as asthma Receiving steroids, such as dexamethasone or prednisone An unusual or allergic reaction to zoledronic acid, other medications, foods, dyes, or preservatives Pregnant or trying to get pregnant Breast-feeding How should I use this medication? This medication is injected into a vein. It is given by your care team in a hospital or clinic setting. Talk to your care team about the use of this medication in children. Special care may be needed. Overdosage: If you think you have taken too much of this medicine contact a poison control center or emergency room at once. NOTE: This medicine is only for you. Do not share this medicine with others. What if I miss a dose? Keep appointments for follow-up doses. It is important not to miss your dose. Call your care team if you are unable to keep an appointment. What may interact with this medication? Certain antibiotics given by injection Diuretics, such as bumetanide, furosemide NSAIDs, medications for pain and inflammation, such as ibuprofen or naproxen Teriparatide Thalidomide This list  may not describe all possible interactions. Give your health care provider a list of all the medicines, herbs, non-prescription drugs, or dietary supplements you use. Also tell them if you smoke, drink alcohol, or use illegal drugs. Some items may interact with your medicine. What should I watch for while using this medication? Visit your care team for regular checks on your progress. It may be some time before you see the benefit from this medication. Some people who take this medication have severe bone, joint, or muscle pain. This medication may also increase your risk for jaw problems or a broken thigh bone. Tell your care team right away if you have severe pain in your jaw, bones, joints, or muscles. Tell you care team if you have any pain that does not go away or that gets worse. Tell your dentist and dental surgeon that you are taking this medication. You should not have major dental surgery while on this medication. See your dentist to have a dental exam and fix any dental problems before starting this medication. Take good care of your teeth while on this medication. Make sure you see your dentist for regular follow-up appointments. You should make sure you get enough calcium and vitamin D while you are taking this medication. Discuss the foods you eat and the vitamins you take with your care team. Check with your care team if you have severe diarrhea, nausea, and vomiting, or if you sweat a lot. The loss of too much body fluid may make it dangerous for you to take this medication. You may need bloodwork while taking this medication. Talk to your care team if   you wish to become pregnant or think you might be pregnant. This medication can cause serious birth defects. What side effects may I notice from receiving this medication? Side effects that you should report to your care team as soon as possible: Allergic reactions--skin rash, itching, hives, swelling of the face, lips, tongue, or  throat Kidney injury--decrease in the amount of urine, swelling of the ankles, hands, or feet Low calcium level--muscle pain or cramps, confusion, tingling, or numbness in the hands or feet Osteonecrosis of the jaw--pain, swelling, or redness in the mouth, numbness of the jaw, poor healing after dental work, unusual discharge from the mouth, visible bones in the mouth Severe bone, joint, or muscle pain Side effects that usually do not require medical attention (report to your care team if they continue or are bothersome): Constipation Fatigue Fever Loss of appetite Nausea Stomach pain This list may not describe all possible side effects. Call your doctor for medical advice about side effects. You may report side effects to FDA at 1-800-FDA-1088. Where should I keep my medication? This medication is given in a hospital or clinic. It will not be stored at home. NOTE: This sheet is a summary. It may not cover all possible information. If you have questions about this medicine, talk to your doctor, pharmacist, or health care provider.  2023 Elsevier/Gold Standard (2021-05-18 00:00:00) Leuprolide Suspension for Injection (Prostate Cancer) What is this medication? LEUPROLIDE (loo PROE lide) reduces the symptoms of prostate cancer. It works by decreasing levels of the hormone testosterone in the body. This prevents prostate cancer cells from spreading or growing. This medicine may be used for other purposes; ask your health care provider or pharmacist if you have questions. COMMON BRAND NAME(S): Eligard, Lupron Depot, Lupron Depot-Ped, Lutrate Depot, Viadur What should I tell my care team before I take this medication? They need to know if you have any of these conditions: Diabetes Heart disease Heart failure High or low levels of electrolytes, such as magnesium, potassium, or sodium in your blood Irregular heartbeat or rhythm Seizures An unusual or allergic reaction to leuprolide, other  medications, foods, dyes, or preservatives Pregnant or trying to get pregnant Breast-feeding How should I use this medication? This medication is injected under the skin or into a muscle. It is given by your care team in a hospital or clinic setting. Talk to your care team about the use of this medication in children. Special care may be needed. Overdosage: If you think you have taken too much of this medicine contact a poison control center or emergency room at once. NOTE: This medicine is only for you. Do not share this medicine with others. What if I miss a dose? Keep appointments for follow-up doses. It is important not to miss your dose. Call your care team if you are unable to keep an appointment. What may interact with this medication? Do not take this medication with any of the following: Cisapride Dronedarone Ketoconazole Levoketoconazole Pimozide Thioridazine This medication may also interact with the following: Other medications that cause heart rhythm changes This list may not describe all possible interactions. Give your health care provider a list of all the medicines, herbs, non-prescription drugs, or dietary supplements you use. Also tell them if you smoke, drink alcohol, or use illegal drugs. Some items may interact with your medicine. What should I watch for while using this medication? Visit your care team for regular checks on your progress. Tell your care team if your symptoms do not start   to get better or if they get worse. This medication may increase blood sugar. The risk may be higher in patients who already have diabetes. Ask your care team what you can do to lower the risk of diabetes while taking this medication. This medication may cause infertility. Talk to your care team if you are concerned about your fertility. Heart attacks and strokes have been reported with the use of this medication. Get emergency help if you develop signs or symptoms of a heart attack or  stroke. Talk to your care team about the risks and benefits of this medication. What side effects may I notice from receiving this medication? Side effects that you should report to your care team as soon as possible: Allergic reactions--skin rash, itching, hives, swelling of the face, lips, tongue, or throat Heart attack--pain or tightness in the chest, shoulders, arms, or jaw, nausea, shortness of breath, cold or clammy skin, feeling faint or lightheaded Heart rhythm changes--fast or irregular heartbeat, dizziness, feeling faint or lightheaded, chest pain, trouble breathing High blood sugar (hyperglycemia)--increased thirst or amount of urine, unusual weakness or fatigue, blurry vision Mood swings, irritability, hostility Seizures Stroke--sudden numbness or weakness of the face, arm, or leg, trouble speaking, confusion, trouble walking, loss of balance or coordination, dizziness, severe headache, change in vision Thoughts of suicide or self-harm, worsening mood, feelings of depression Side effects that usually do not require medical attention (report to your care team if they continue or are bothersome): Bone pain Change in sex drive or performance General discomfort and fatigue Hot flashes Muscle pain Pain, redness, or irritation at injection site Swelling of the ankles, hands, or feet This list may not describe all possible side effects. Call your doctor for medical advice about side effects. You may report side effects to FDA at 1-800-FDA-1088. Where should I keep my medication? This medication is given in a hospital or clinic. It will not be stored at home. NOTE: This sheet is a summary. It may not cover all possible information. If you have questions about this medicine, talk to your doctor, pharmacist, or health care provider.  2023 Elsevier/Gold Standard (2021-06-14 00:00:00)   

## 2022-05-26 LAB — PROSTATE-SPECIFIC AG, SERUM (LABCORP): Prostate Specific Ag, Serum: 18.5 ng/mL — ABNORMAL HIGH (ref 0.0–4.0)

## 2022-05-28 ENCOUNTER — Inpatient Hospital Stay: Payer: BC Managed Care – PPO

## 2022-05-28 NOTE — Progress Notes (Unsigned)
  Subjective:    Edgar Mooney - 57 y.o. male MRN [id number]  Date of birth: 11-07-1965  HPI  Edgar Mooney is to establish care.   Current issues and/or concerns: Cards - CAD  Oncology - prostate cancer   ROS per HPI     Health Maintenance:  Health Maintenance Due  Topic Date Due   COVID-19 Vaccine (1) Never done   DTaP/Tdap/Td (1 - Tdap) Never done   Zoster Vaccines- Shingrix (1 of 2) Never done   COLONOSCOPY (Pts 45-3yrs Insurance coverage will need to be confirmed)  Never done   INFLUENZA VACCINE  Never done     Past Medical History: Patient Active Problem List   Diagnosis Date Noted   Chest pain 09/16/2021   Prostate cancer metastatic to bone (HCC) 09/16/2021   Admission for end of life care 09/16/2021   Angina pectoris (HCC)    Goals of care, counseling/discussion 05/23/2021   Monoallelic mutation of ATM gene 40/98/1191   Genetic testing 04/04/2021   Unstable angina (HCC) 12/19/2020   Prostate cancer (HCC) 03/07/2020   Anemia 03/03/2020   Hypertriglyceridemia 02/27/2018   Cocaine abuse with cocaine-induced mood disorder (HCC) 04/03/2016   Depression    Idiopathic thrombocytopenic purpura (HCC) 01/14/2016   Polysubstance abuse (HCC) 01/14/2016   Benzodiazepine abuse (HCC) 01/05/2016   Major depressive disorder, recurrent episode, severe, with psychosis (HCC) 01/05/2016   Gout 07/06/2015   Hep C w/o coma, chronic (HCC) 07/06/2015   HTN (hypertension) 12/01/2014   Elevated blood uric acid level 12/01/2014   History of ETOH abuse 12/01/2014      Social History   reports that he has been smoking cigarettes. He has a 26.00 pack-year smoking history. He has never used smokeless tobacco. He reports that he does not currently use alcohol after a past usage of about 126.0 standard drinks of alcohol per week. He reports current drug use. Drugs: Cocaine, IV, Heroin, and Marijuana.   Family History  family history includes Cancer in his father and mother.    Medications: reviewed and updated   Objective:   Physical Exam There were no vitals taken for this visit. Physical Exam      Assessment & Plan:         Patient was given clear instructions to go to Emergency Department or return to medical center if symptoms don't improve, worsen, or new problems develop.The patient verbalized understanding.  I discussed the assessment and treatment plan with the patient. The patient was provided an opportunity to ask questions and all were answered. The patient agreed with the plan and demonstrated an understanding of the instructions.   The patient was advised to call back or seek an in-person evaluation if the symptoms worsen or if the condition fails to improve as anticipated.    Ricky Stabs, NP 05/28/2022, 10:28 AM Primary Care at [facility]

## 2022-05-28 NOTE — Progress Notes (Signed)
Nutrition Follow-up:  Patient with prostate cancer and hepatocellular carcinoma.  Patient receiving pluvicto.    Spoke with patient via phone.  Says that his appetite is up and down similar to how he feels.  Denies nausea.   Medications: reviewed  Labs: reviewed  Anthropometrics:   Weight 127 lb on 2/8  126 lb on 04/18/22 123 lb 12.8 oz on 12/4 144 lb on 11/2 (swelling) 158 lb on 5/9    NUTRITION DIAGNOSIS: Unintentional weight loss improved    INTERVENTION:  Continue 350 calorie supplement Reviewed ways to add calories and protein in diet to promote weight gain Encouraged maximize intake on good days    MONITORING, EVALUATION, GOAL: weight trends, intake   NEXT VISIT: as needed RD available if needed  Irais Mottram B. Zenia Resides, [place], [place] Registered Dietitian [phone]

## 2022-05-29 ENCOUNTER — Other Ambulatory Visit: Payer: Self-pay | Admitting: Nurse Practitioner

## 2022-05-29 ENCOUNTER — Telehealth: Payer: Self-pay | Admitting: *Deleted

## 2022-05-29 ENCOUNTER — Other Ambulatory Visit (HOSPITAL_BASED_OUTPATIENT_CLINIC_OR_DEPARTMENT_OTHER): Payer: Self-pay

## 2022-05-29 DIAGNOSIS — C61 Malignant neoplasm of prostate: Secondary | ICD-10-CM

## 2022-05-29 MED ORDER — OXYCODONE HCL 5 MG PO TABS
5.0000 mg | ORAL_TABLET | Freq: Four times a day (QID) | ORAL | 0 refills | Status: DC | PRN
  Filled 2022-05-29: qty 100, 13d supply, fill #0

## 2022-05-29 NOTE — Telephone Encounter (Signed)
Informed Edgar Mooney that oxycodone was refilled for #100 tabs and not #150. He is only to take 1-2 tablets every 6 hours instead of every 4 hours. His PSA is coming down, so we need to wean him down some. He understands and agrees.

## 2022-05-29 NOTE — Telephone Encounter (Signed)
Mr. Nulph called requesting refill on his oxycodone for today. Forwarded to provider.

## 2022-05-30 ENCOUNTER — Encounter: Payer: Self-pay | Admitting: Family

## 2022-05-30 ENCOUNTER — Inpatient Hospital Stay: Payer: BC Managed Care – PPO

## 2022-05-30 DIAGNOSIS — Z7689 Persons encountering health services in other specified circumstances: Secondary | ICD-10-CM

## 2022-05-31 NOTE — Written Directive (Addendum)
MOLECULAR IMAGING AND THERAPEUTICS WRITTEN DIRECTIVE   PATIENT NAME: Edgar Mooney  PT DOB:   1965/07/17                                              MRN: [id number]  ---------------------------------------------------------------------------------------------------------------------   PLUVICTO  THERAPY   RADIOPHARMACEUTICAL: Lutetium 177 vipivotide tetraxetan (Pluvicto)     PRESCRIBED DOSE FOR ADMINISTRATION: 150 mCi   ROUTE OFADMINISTRATION:  IV   DIAGNOSIS:  Prostate cancer    REFERRING PHYSICIAN: Ned Card, NP   TREATMENT #: 3   ADDITIONAL PHYSICIAN COMMENTS/NOTES:   AUTHORIZED USER SIGNATURE & TIME STAMP: Rennis Golden, MD   06/01/22    8:13 AM

## 2022-06-06 ENCOUNTER — Ambulatory Visit (HOSPITAL_COMMUNITY)
Admission: RE | Admit: 2022-06-06 | Discharge: 2022-06-06 | Disposition: A | Discharge status: Home / Self Care | Payer: BC Managed Care – PPO | Source: Ambulatory Visit | Attending: Nurse Practitioner | Admitting: Nurse Practitioner

## 2022-06-06 ENCOUNTER — Inpatient Hospital Stay: Payer: BC Managed Care – PPO

## 2022-06-06 VITALS — BP 123/63 | HR 80

## 2022-06-06 DIAGNOSIS — C61 Malignant neoplasm of prostate: Secondary | ICD-10-CM | POA: Insufficient documentation

## 2022-06-06 LAB — CBC
HCT: 26.6 % — ABNORMAL LOW (ref 39.0–52.0)
Hemoglobin: 9.1 g/dL — ABNORMAL LOW (ref 13.0–17.0)
MCH: 31.2 pg (ref 26.0–34.0)
MCHC: 34.2 g/dL (ref 30.0–36.0)
MCV: 91.1 fL (ref 80.0–100.0)
Platelets: 81 10*3/uL — ABNORMAL LOW (ref 150–400)
RBC: 2.92 MIL/uL — ABNORMAL LOW (ref 4.22–5.81)
RDW: 19.1 % — ABNORMAL HIGH (ref 11.5–15.5)
WBC: 3.9 10*3/uL — ABNORMAL LOW (ref 4.0–10.5)
nRBC: 0.8 % — ABNORMAL HIGH (ref 0.0–0.2)

## 2022-06-06 LAB — BASIC METABOLIC PANEL
Anion gap: 8 (ref 5–15)
BUN: 15 mg/dL (ref 6–20)
CO2: 19 mmol/L — ABNORMAL LOW (ref 22–32)
Calcium: 8.2 mg/dL — ABNORMAL LOW (ref 8.9–10.3)
Chloride: 106 mmol/L (ref 98–111)
Creatinine, Ser: 0.49 mg/dL — ABNORMAL LOW (ref 0.61–1.24)
GFR, Estimated: >60 mL/min (ref >60)
Glucose, Bld: 99 mg/dL (ref 70–99)
Potassium: 4.4 mmol/L (ref 3.5–5.1)
Sodium: 133 mmol/L — ABNORMAL LOW (ref 135–145)

## 2022-06-06 MED ORDER — SODIUM CHLORIDE 0.9 % IV BOLUS: 1000.0000 mL | Freq: Once | INTRAVENOUS | Status: DC

## 2022-06-06 MED ORDER — SODIUM CHLORIDE 0.9 % IV SOLN: INTRAVENOUS | Status: DC

## 2022-06-06 MED ORDER — LUTETIUM LU 177 VIPIVOTIDE TET 1000 MBQ/ML IV SOLN
149.4000 | Freq: Once | INTRAVENOUS | Status: AC
  Administered 2022-06-06: 149.4 via INTRAVENOUS

## 2022-06-06 NOTE — Progress Notes (Signed)
CLINICAL DATA: [Prostate cancer with extensive skeletal metastasis]  EXAM: NUCLEAR MEDICINE PLUVICTO INJECTION  TECHNIQUE: Infusion: The nuclear medicine technologist and I personally verified the dose activity to be delivered as specified in the written directive, and verified the patient identification via 2 separate methods.  Initial flush of the intravenous catheter was performed was sterile saline. The dose syringe was connected to the catheter and the Lu-177 Pluvicto administered over a 1 to 10 min infusion. Single 10 cc  lushes with normal saline follow the dose. No complications were noted. The entire IV tubing, venocatheter, stopcock and syringes was removed in total, placed in a disposal bag and sent for assay of the residual activity, which will be reported at a later time in our EMR by the physics staff. Pressure was applied to the venipuncture site, and a compression bandage placed. Patient monitored for 1 hour following infusion.    Radiation Safety personnel were present to perform the discharge survey, as detailed on their documentation. After a short period of observation, the patient had his IV removed.  RADIOPHARMACEUTICALS: [149.4] microcuries Lu-177 PLUVICTO  FINDINGS: Current Infusion: [3]  Planned Infusions: 6    Patient presented to nuclear medicine for treatment. The patient's most recent blood counts were reviewed and remains an adequate candidate to with Lu-177 Pluvicto.     Patient reports improvement in bone pain.  Some fatigue.     Hemoglobin has stabilized with no decrease in hemoglobin levels following treatment.  Hemoglobin 9.1 today.  White blood cell count stabilized at 3.9.  Platelets increased slightly to  81,000     GFR normal.     Patient's PSA has decreased dramatically with most recent level equal 18 decreased from 1,700 pretreatment.     Patient continued to receive a reduced dose PSMA Lu - 177  due to initial severe anemia.      The patient was situated in an infusion suite with a contact barrier placed under the arm. Intravenous access was established, using sterile technique, and a normal saline infusion from a syringe was started.     Micro-dosimetry: The prescribed radiation activity was assayed and confirmed to be within specified tolerance.  IMPRESSION: Current Infusion: [3]  Planned Infusions: 6    [The patient tolerated the infusion well. The patient will return in 6 weeks  for ongoing care.]

## 2022-06-12 ENCOUNTER — Other Ambulatory Visit (HOSPITAL_BASED_OUTPATIENT_CLINIC_OR_DEPARTMENT_OTHER): Payer: Self-pay

## 2022-06-13 ENCOUNTER — Telehealth: Payer: Self-pay | Admitting: *Deleted

## 2022-06-13 ENCOUNTER — Other Ambulatory Visit: Payer: Self-pay | Admitting: Nurse Practitioner

## 2022-06-13 ENCOUNTER — Other Ambulatory Visit (HOSPITAL_BASED_OUTPATIENT_CLINIC_OR_DEPARTMENT_OTHER): Payer: Self-pay

## 2022-06-13 DIAGNOSIS — C61 Malignant neoplasm of prostate: Secondary | ICD-10-CM

## 2022-06-13 MED ORDER — OXYCODONE HCL 5 MG PO TABS
5.0000 mg | ORAL_TABLET | Freq: Four times a day (QID) | ORAL | 0 refills | Status: DC | PRN
  Filled 2022-06-13: qty 100, 25d supply, fill #0

## 2022-06-13 NOTE — Telephone Encounter (Signed)
Left Mr. Bissette voice mail that the oxycodone has been refilled with dosing change: Take only #1 tablet every 6 hours as needed for severe pain. With his cancer improving, we need to start to wean him down slowly from the oxycodone.

## 2022-06-21 ENCOUNTER — Encounter: Payer: Self-pay | Admitting: Nurse Practitioner

## 2022-06-21 ENCOUNTER — Encounter: Payer: Self-pay | Admitting: *Deleted

## 2022-06-21 ENCOUNTER — Inpatient Hospital Stay: Payer: BC Managed Care – PPO | Attending: Nurse Practitioner

## 2022-06-21 ENCOUNTER — Inpatient Hospital Stay (HOSPITAL_BASED_OUTPATIENT_CLINIC_OR_DEPARTMENT_OTHER): Payer: BC Managed Care – PPO | Admitting: Oncology

## 2022-06-21 VITALS — BP 152/82 | HR 76 | Temp 98.1°F | Resp 18 | Ht 62.0 in | Wt 130.0 lb

## 2022-06-21 DIAGNOSIS — C61 Malignant neoplasm of prostate: Secondary | ICD-10-CM

## 2022-06-21 DIAGNOSIS — Z86711 Personal history of pulmonary embolism: Secondary | ICD-10-CM | POA: Diagnosis not present

## 2022-06-21 DIAGNOSIS — K7460 Unspecified cirrhosis of liver: Secondary | ICD-10-CM | POA: Insufficient documentation

## 2022-06-21 DIAGNOSIS — I252 Old myocardial infarction: Secondary | ICD-10-CM | POA: Insufficient documentation

## 2022-06-21 DIAGNOSIS — D696 Thrombocytopenia, unspecified: Secondary | ICD-10-CM | POA: Insufficient documentation

## 2022-06-21 DIAGNOSIS — M1612 Unilateral primary osteoarthritis, left hip: Secondary | ICD-10-CM | POA: Insufficient documentation

## 2022-06-21 DIAGNOSIS — F32A Depression, unspecified: Secondary | ICD-10-CM | POA: Diagnosis not present

## 2022-06-21 DIAGNOSIS — Z8619 Personal history of other infectious and parasitic diseases: Secondary | ICD-10-CM | POA: Insufficient documentation

## 2022-06-21 DIAGNOSIS — C7951 Secondary malignant neoplasm of bone: Secondary | ICD-10-CM | POA: Insufficient documentation

## 2022-06-21 DIAGNOSIS — F17200 Nicotine dependence, unspecified, uncomplicated: Secondary | ICD-10-CM | POA: Diagnosis not present

## 2022-06-21 DIAGNOSIS — F1911 Other psychoactive substance abuse, in remission: Secondary | ICD-10-CM | POA: Insufficient documentation

## 2022-06-21 DIAGNOSIS — R162 Hepatomegaly with splenomegaly, not elsewhere classified: Secondary | ICD-10-CM | POA: Diagnosis not present

## 2022-06-21 DIAGNOSIS — D649 Anemia, unspecified: Secondary | ICD-10-CM | POA: Insufficient documentation

## 2022-06-21 LAB — CMP (CANCER CENTER ONLY)
ALT: 8 U/L (ref 0–44)
AST: 13 U/L — ABNORMAL LOW (ref 15–41)
Albumin: 4.1 g/dL (ref 3.5–5.0)
Alkaline Phosphatase: 242 U/L — ABNORMAL HIGH (ref 38–126)
Anion gap: 7 (ref 5–15)
BUN: 13 mg/dL (ref 6–20)
CO2: 21 mmol/L — ABNORMAL LOW (ref 22–32)
Calcium: 8.6 mg/dL — ABNORMAL LOW (ref 8.9–10.3)
Chloride: 111 mmol/L (ref 98–111)
Creatinine: 0.48 mg/dL — ABNORMAL LOW (ref 0.61–1.24)
GFR, Estimated: >60 mL/min (ref >60)
Glucose, Bld: 135 mg/dL — ABNORMAL HIGH (ref 70–99)
Potassium: 4 mmol/L (ref 3.5–5.1)
Sodium: 139 mmol/L (ref 135–145)
Total Bilirubin: 1 mg/dL (ref 0.3–1.2)
Total Protein: 6.3 g/dL — ABNORMAL LOW (ref 6.5–8.1)

## 2022-06-21 LAB — CBC WITH DIFFERENTIAL (CANCER CENTER ONLY)
Abs Immature Granulocytes: 0.09 10*3/uL — ABNORMAL HIGH (ref 0.00–0.07)
Basophils Absolute: 0 10*3/uL (ref 0.0–0.1)
Basophils Relative: 1 %
Eosinophils Absolute: 0 10*3/uL (ref 0.0–0.5)
Eosinophils Relative: 1 %
HCT: 26.2 % — ABNORMAL LOW (ref 39.0–52.0)
Hemoglobin: 9 g/dL — ABNORMAL LOW (ref 13.0–17.0)
Immature Granulocytes: 3 %
Lymphocytes Relative: 9 %
Lymphs Abs: 0.3 10*3/uL — ABNORMAL LOW (ref 0.7–4.0)
MCH: 31.9 pg (ref 26.0–34.0)
MCHC: 34.4 g/dL (ref 30.0–36.0)
MCV: 92.9 fL (ref 80.0–100.0)
Monocytes Absolute: 0.2 10*3/uL (ref 0.1–1.0)
Monocytes Relative: 7 %
Neutro Abs: 2.5 10*3/uL (ref 1.7–7.7)
Neutrophils Relative %: 79 %
Platelet Count: 56 10*3/uL — ABNORMAL LOW (ref 150–400)
RBC: 2.82 MIL/uL — ABNORMAL LOW (ref 4.22–5.81)
RDW: 18.2 % — ABNORMAL HIGH (ref 11.5–15.5)
WBC Count: 3.1 10*3/uL — ABNORMAL LOW (ref 4.0–10.5)
nRBC: 1 % — ABNORMAL HIGH (ref 0.0–0.2)

## 2022-06-21 NOTE — Progress Notes (Signed)
Edgar Mooney OFFICE PROGRESS NOTE   Diagnosis: Prostate cancer  INTERVAL HISTORY:   Mr. Kirvin returns as scheduled.  He reports a good appetite.  He continues to have low back pain.  He is no longer taking MS Contin.  He reports taking approximately 6 oxycodone tablets per day for relief of pain.  He takes 2 oxycodone tablets prior to bedtime.  No bleeding.  He completed cycle 3 Pluvicto on 06/06/2022.  Reports tolerating the treatment well.  Objective:  Vital signs in last 24 hours:  Blood pressure (!) 152/82, pulse 76, temperature 98.1 F (36.7 C), temperature source Oral, resp. rate 18, height '5\' 2"'$  (1.575 m), weight 130 lb (59 kg), SpO2 100 %.    HEENT: White coat over the tongue, no buccal thrush Resp: Lungs clear bilaterally Cardio: Regular rate and rhythm GI: No hepatosplenomegaly, nontender Vascular: No leg edema Musculoskeletal: Pain is at the lower back, no tenderness  Lab Results:  Lab Results  Component Value Date   WBC 3.1 (L) 06/21/2022   HGB 9.0 (L) 06/21/2022   HCT 26.2 (L) 06/21/2022   MCV 92.9 06/21/2022   PLT 56 (L) 06/21/2022   NEUTROABS 2.5 06/21/2022    CMP  Lab Results  Component Value Date   NA 139 06/21/2022   K 4.0 06/21/2022   CL 111 06/21/2022   CO2 21 (L) 06/21/2022   GLUCOSE 135 (H) 06/21/2022   BUN 13 06/21/2022   CREATININE 0.48 (L) 06/21/2022   CALCIUM 8.6 (L) 06/21/2022   PROT 6.3 (L) 06/21/2022   ALBUMIN 4.1 06/21/2022   AST 13 (L) 06/21/2022   ALT 8 06/21/2022   ALKPHOS 242 (H) 06/21/2022   BILITOT 1.0 06/21/2022   GFRNONAA >60 06/21/2022   GFRAA 121 12/19/2017     Medications: I have reviewed the patient's current medications.   Assessment/Plan: Metastatic prostate cancer - Lytic bone lesions with retroperitoneal adenopathy concerning for metastatic prostate cancer -03/03/2020-CTA chest/abdomen/pelvis widespread osseous metastatic disease and lower retroperitoneal adenopathy, pathologic right  anterior third rib fracture, probable spinal canal tumor at the level of S1, -MRI cervical, thoracic, and lumbar spine 03/03/2020-diffuse osseous metastatic disease, ventral epidural tumor impinging on right S1 nerve root, extraosseous tumor at the bilateral ilium, asymmetric enhancing material at the right C5-6 canal-right  -03/03/2020-PSA 763 -Degarelix 03/04/2020 -Abiraterone/prednisone 03/14/2020 -Every 78-monthLupron 04/01/2020 -04/01/2020 PSA 36.5 -05/31/2020 PSA 1.2 -06/28/2020 PSA 1.0 -12/14/2020 PSA 6.4 -01/13/2021 PSA 10.6 -04/26/2021 PSA  45.2 -05/16/2021 PSA 70 -Abiraterone/prednisone discontinued 05/16/2021 -05/16/2021 left hip x-ray-multifocal sclerotic metastasis throughout the pelvis, confluent involving the left acetabulum.  No acute or pathologic fracture.  Left hip osteoarthritis. -Cycle 1 docetaxel 05/31/2021 -05/31/2021 PSA 113 -Palliative radiation to the left acetabulum 06/08/2021 - 06/20/2021 -Cycle 2 docetaxel 06/21/2021 -06/21/2021 PSA improved at 68 -Cycle 3 docetaxel 07/12/2021 -07/12/2021 PSA improved at 52 -Cycle 4 docetaxel 08/02/2021 -Cycle 5 docetaxel 08/22/2021 -CT chest 09/16/2021-negative for pulmonary embolism, right liver mass, extensive bone metastases -Cycle 6 docetaxel 09/28/2021 -PSA increased 09/28/2021 -Olaparib 10/24/2021, patient decreased dose to 1 tablet twice daily on 10/31/2021 due to muscle spasms; dose resumed at prescribed amount 11/06/2021 -PSA increased 11/24/2021 -PSA further increased 12/08/2021 -Olaparib discontinued -PET PSMA scan 01/11/2022-widespread intensely radiotracer avid sclerotic skeletal metastasis involving the axillary and appendicular skeleton.  Small-volume periaortic retroperitoneal radiotracer avid prostate cancer nodal metastasis.  Nonspecific activity in the prostate gland.  No liver or lung prostate cancer metastasis. -Palliative radiation to the right pelvis and proximal right femur 02/01/2022 -Pluvicto 03/14/2022 -PSA  markedly improved  at 160 on 04/18/2022 -Pluvicto 04/26/2022 -Pluvicto 06/06/2022 2.  Severe anemia-likely secondary to metastatic prostate cancer involving the bones 3.  Mild thrombocytopenia 4.  Cirrhosis with splenomegaly 5.  History of hepatitis C 6.  History of polysubstance abuse 7.  Depression 8.  Tobacco dependence 9.  Right arm weakness, right facial numbness-potentially related to nerve root compromise from metastatic bone lesions 10.  Pain secondary to #1 11.  Extensive bone metastases-every 60-monthZometa starting 04/01/2020 12.  Gout-acute flare right wrist 04/01/2020 treated with indomethacin, allopurinol resumed 13.  Right hepatic lobe mass with elevated AFP [facility] 12/21/2020-hypoenhancing inferior right liver mass, segment 6, occluding or compressing the adjacent right portal vein branch, intrahepatic cholangiocarcinoma favored with differential including hepatocellular carcinoma, Li-Rads M Y 90 planning study 11-22 Y90 right lobe of liver 06/26/2021 14.  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  [facility] chest pain[facility]    Disposition: [name] metastatic prostate cancer.  He has completed 3 treatments with Pluvicto.  His clinical status is markedly improved.  He will continue to wean oxycodone as tolerated.  The PSA is lower.  [name] scheduled for cycle 4 Pluvicto on 07/18/2022.  He will return for an office and lab visit on 07/11/2022.  We encouraged him to resume blood pressure and cardiac medications.  GBetsy Coder MD  06/21/2022  8:37 AM

## 2022-06-21 NOTE — Progress Notes (Signed)
Provided Mr. Freerksen a case of vanilla Ensure today

## 2022-06-22 LAB — PROSTATE-SPECIFIC AG, SERUM (LABCORP): Prostate Specific Ag, Serum: 19.3 ng/mL — ABNORMAL HIGH (ref 0.0–4.0)

## 2022-06-26 ENCOUNTER — Telehealth: Payer: Self-pay | Admitting: *Deleted

## 2022-06-26 ENCOUNTER — Other Ambulatory Visit: Payer: Self-pay | Admitting: Nurse Practitioner

## 2022-06-26 ENCOUNTER — Other Ambulatory Visit (HOSPITAL_BASED_OUTPATIENT_CLINIC_OR_DEPARTMENT_OTHER): Payer: Self-pay

## 2022-06-26 DIAGNOSIS — C61 Malignant neoplasm of prostate: Secondary | ICD-10-CM

## 2022-06-26 MED ORDER — OXYCODONE HCL 5 MG PO TABS
5.0000 mg | ORAL_TABLET | Freq: Three times a day (TID) | ORAL | 0 refills | Status: DC | PRN
  Filled 2022-06-26: qty 100, 17d supply, fill #0

## 2022-06-26 NOTE — Telephone Encounter (Signed)
Received refill request for his oxycodone from refill sent on 2/28 for #100 tablets that should last 25 days. Called patient and confirmed has has been taking #2 tablets instead of only #1 for his pain because #1 does not help enough. He has #15 pills on hand now. Informed him he should never take more narcotic than ordered without discussion w/MD first. We are trying to wean him off this medication. Dr. Benay Spice approved for him to take #2 tablets ever 8 hours prn (max 6 tab/day). Mr. Ki made aware and was told he will not be getting an early refill. Suggested he call pharmacy tomorrow before going to pick up med to be sure he will be allowed to fill tomorrow. Reports significant back pain and takes #2 at night to rest. Suggested he take #2 ES Tylenol at night as well and he will try this.

## 2022-07-05 ENCOUNTER — Other Ambulatory Visit: Payer: Self-pay | Admitting: *Deleted

## 2022-07-05 DIAGNOSIS — C61 Malignant neoplasm of prostate: Secondary | ICD-10-CM

## 2022-07-10 ENCOUNTER — Other Ambulatory Visit (HOSPITAL_BASED_OUTPATIENT_CLINIC_OR_DEPARTMENT_OTHER): Payer: Self-pay

## 2022-07-10 ENCOUNTER — Other Ambulatory Visit: Payer: Self-pay | Admitting: Nurse Practitioner

## 2022-07-10 ENCOUNTER — Other Ambulatory Visit: Payer: Self-pay | Admitting: Oncology

## 2022-07-10 DIAGNOSIS — C61 Malignant neoplasm of prostate: Secondary | ICD-10-CM

## 2022-07-11 ENCOUNTER — Other Ambulatory Visit: Payer: BC Managed Care – PPO

## 2022-07-11 ENCOUNTER — Encounter: Payer: Self-pay | Admitting: Oncology

## 2022-07-11 ENCOUNTER — Other Ambulatory Visit (HOSPITAL_BASED_OUTPATIENT_CLINIC_OR_DEPARTMENT_OTHER): Payer: Self-pay

## 2022-07-11 ENCOUNTER — Inpatient Hospital Stay: Payer: BC Managed Care – PPO

## 2022-07-11 ENCOUNTER — Encounter: Payer: Self-pay | Admitting: Nurse Practitioner

## 2022-07-11 ENCOUNTER — Inpatient Hospital Stay (HOSPITAL_BASED_OUTPATIENT_CLINIC_OR_DEPARTMENT_OTHER): Payer: BC Managed Care – PPO | Admitting: Oncology

## 2022-07-11 VITALS — BP 145/78 | HR 69 | Temp 98.1°F | Resp 18 | Ht 62.0 in | Wt 129.0 lb

## 2022-07-11 DIAGNOSIS — C7951 Secondary malignant neoplasm of bone: Secondary | ICD-10-CM | POA: Diagnosis not present

## 2022-07-11 DIAGNOSIS — I252 Old myocardial infarction: Secondary | ICD-10-CM | POA: Diagnosis not present

## 2022-07-11 DIAGNOSIS — F32A Depression, unspecified: Secondary | ICD-10-CM | POA: Diagnosis not present

## 2022-07-11 DIAGNOSIS — F17200 Nicotine dependence, unspecified, uncomplicated: Secondary | ICD-10-CM | POA: Diagnosis not present

## 2022-07-11 DIAGNOSIS — C61 Malignant neoplasm of prostate: Secondary | ICD-10-CM

## 2022-07-11 DIAGNOSIS — D696 Thrombocytopenia, unspecified: Secondary | ICD-10-CM | POA: Diagnosis not present

## 2022-07-11 DIAGNOSIS — M1612 Unilateral primary osteoarthritis, left hip: Secondary | ICD-10-CM | POA: Diagnosis not present

## 2022-07-11 DIAGNOSIS — K7460 Unspecified cirrhosis of liver: Secondary | ICD-10-CM | POA: Diagnosis not present

## 2022-07-11 DIAGNOSIS — D649 Anemia, unspecified: Secondary | ICD-10-CM | POA: Diagnosis not present

## 2022-07-11 DIAGNOSIS — Z86711 Personal history of pulmonary embolism: Secondary | ICD-10-CM | POA: Diagnosis not present

## 2022-07-11 DIAGNOSIS — Z8619 Personal history of other infectious and parasitic diseases: Secondary | ICD-10-CM | POA: Diagnosis not present

## 2022-07-11 DIAGNOSIS — F1911 Other psychoactive substance abuse, in remission: Secondary | ICD-10-CM | POA: Diagnosis not present

## 2022-07-11 DIAGNOSIS — R162 Hepatomegaly with splenomegaly, not elsewhere classified: Secondary | ICD-10-CM | POA: Diagnosis not present

## 2022-07-11 LAB — CBC WITH DIFFERENTIAL (CANCER CENTER ONLY)
Abs Immature Granulocytes: 0.09 10*3/uL — ABNORMAL HIGH (ref 0.00–0.07)
Basophils Absolute: 0 10*3/uL (ref 0.0–0.1)
Basophils Relative: 1 %
Eosinophils Absolute: 0 10*3/uL (ref 0.0–0.5)
Eosinophils Relative: 1 %
HCT: 27.3 % — ABNORMAL LOW (ref 39.0–52.0)
Hemoglobin: 9.2 g/dL — ABNORMAL LOW (ref 13.0–17.0)
Immature Granulocytes: 2 %
Lymphocytes Relative: 11 %
Lymphs Abs: 0.5 10*3/uL — ABNORMAL LOW (ref 0.7–4.0)
MCH: 32.7 pg (ref 26.0–34.0)
MCHC: 33.7 g/dL (ref 30.0–36.0)
MCV: 97.2 fL (ref 80.0–100.0)
Monocytes Absolute: 0.3 10*3/uL (ref 0.1–1.0)
Monocytes Relative: 7 %
Neutro Abs: 3.2 10*3/uL (ref 1.7–7.7)
Neutrophils Relative %: 78 %
Platelet Count: 77 10*3/uL — ABNORMAL LOW (ref 150–400)
RBC: 2.81 MIL/uL — ABNORMAL LOW (ref 4.22–5.81)
RDW: 16.1 % — ABNORMAL HIGH (ref 11.5–15.5)
WBC Count: 4.1 10*3/uL (ref 4.0–10.5)
nRBC: 0 % (ref 0.0–0.2)

## 2022-07-11 LAB — CMP (CANCER CENTER ONLY)
ALT: 8 U/L (ref 0–44)
AST: 17 U/L (ref 15–41)
Albumin: 4.5 g/dL (ref 3.5–5.0)
Alkaline Phosphatase: 237 U/L — ABNORMAL HIGH (ref 38–126)
Anion gap: 8 (ref 5–15)
BUN: 15 mg/dL (ref 6–20)
CO2: 21 mmol/L — ABNORMAL LOW (ref 22–32)
Calcium: 9.2 mg/dL (ref 8.9–10.3)
Chloride: 110 mmol/L (ref 98–111)
Creatinine: 0.47 mg/dL — ABNORMAL LOW (ref 0.61–1.24)
GFR, Estimated: >60 mL/min (ref >60)
Glucose, Bld: 123 mg/dL — ABNORMAL HIGH (ref 70–99)
Potassium: 4 mmol/L (ref 3.5–5.1)
Sodium: 139 mmol/L (ref 135–145)
Total Bilirubin: 1 mg/dL (ref 0.3–1.2)
Total Protein: 7.1 g/dL (ref 6.5–8.1)

## 2022-07-11 MED ORDER — OXYCODONE HCL 5 MG PO TABS
5.0000 mg | ORAL_TABLET | Freq: Three times a day (TID) | ORAL | 0 refills | Status: DC | PRN
  Filled 2022-07-11: qty 100, 17d supply, fill #0

## 2022-07-11 NOTE — Progress Notes (Signed)
Edgar Mooney OFFICE PROGRESS NOTE   Diagnosis: Prostate cancer  INTERVAL HISTORY:   Edgar Mooney returns as scheduled.  He continues to have low back pain.  He reports "soreness "in the right thigh.  No leg swelling.  He takes oxycodone for relief of pain.  He takes approximately 5 oxycodone tablets per day.  No difficulty with bowel or bladder function.  Good appetite.  He is scheduled for the next cycle of Pluvicto on 07/18/2022.  Objective:  Vital signs in last 24 hours:  Blood pressure (!) 145/78, pulse 69, temperature 98.1 F (36.7 C), temperature source Oral, resp. rate 18, height 5\' 2"  (1.575 m), weight 129 lb (58.5 kg), SpO2 96 %.    HEENT: No thrush Resp: Clear bilaterally Cardio: Regular rate and rhythm GI: No hepatosplenomegaly Vascular: No leg edema Musculoskeletal: No spine tenderness.  Mild tenderness at the right anterior thigh musculature.  No mass.  No erythema.  Lab Results:  Lab Results  Component Value Date   WBC 4.1 07/11/2022   HGB 9.2 (L) 07/11/2022   HCT 27.3 (L) 07/11/2022   MCV 97.2 07/11/2022   PLT 77 (L) 07/11/2022   NEUTROABS 3.2 07/11/2022    CMP  Lab Results  Component Value Date   NA PENDING 07/11/2022   K PENDING 07/11/2022   CL PENDING 07/11/2022   CO2 21 (L) 07/11/2022   GLUCOSE 123 (H) 07/11/2022   BUN 15 07/11/2022   CREATININE 0.47 (L) 07/11/2022   CALCIUM 9.2 07/11/2022   PROT 7.1 07/11/2022   ALBUMIN 4.5 07/11/2022   AST 17 07/11/2022   ALT 8 07/11/2022   ALKPHOS 237 (H) 07/11/2022   BILITOT 1.0 07/11/2022   GFRNONAA >60 07/11/2022   GFRAA 121 12/19/2017     Medications: I have reviewed the patient's current medications.   Assessment/Plan: Metastatic prostate cancer - Lytic bone lesions with retroperitoneal adenopathy concerning for metastatic prostate cancer -03/03/2020-CTA chest/abdomen/pelvis widespread osseous metastatic disease and lower retroperitoneal adenopathy, pathologic right anterior third  rib fracture, probable spinal canal tumor at the level of S1, -MRI cervical, thoracic, and lumbar spine 03/03/2020-diffuse osseous metastatic disease, ventral epidural tumor impinging on right S1 nerve root, extraosseous tumor at the bilateral ilium, asymmetric enhancing material at the right C5-6 canal-right  -03/03/2020-PSA 763 -Degarelix 03/04/2020 -Abiraterone/prednisone 03/14/2020 -Every 13-month Lupron 04/01/2020 -04/01/2020 PSA 36.5 -05/31/2020 PSA 1.2 -06/28/2020 PSA 1.0 -12/14/2020 PSA 6.4 -01/13/2021 PSA 10.6 -04/26/2021 PSA  45.2 -05/16/2021 PSA 70 -Abiraterone/prednisone discontinued 05/16/2021 -05/16/2021 left hip x-ray-multifocal sclerotic metastasis throughout the pelvis, confluent involving the left acetabulum.  No acute or pathologic fracture.  Left hip osteoarthritis. -Cycle 1 docetaxel 05/31/2021 -05/31/2021 PSA 113 -Palliative radiation to the left acetabulum 06/08/2021 - 06/20/2021 -Cycle 2 docetaxel 06/21/2021 -06/21/2021 PSA improved at 68 -Cycle 3 docetaxel 07/12/2021 -07/12/2021 PSA improved at 52 -Cycle 4 docetaxel 08/02/2021 -Cycle 5 docetaxel 08/22/2021 -CT chest 09/16/2021-negative for pulmonary embolism, right liver mass, extensive bone metastases -Cycle 6 docetaxel 09/28/2021 -PSA increased 09/28/2021 -Olaparib 10/24/2021, patient decreased dose to 1 tablet twice daily on 10/31/2021 due to muscle spasms; dose resumed at prescribed amount 11/06/2021 -PSA increased 11/24/2021 -PSA further increased 12/08/2021 -Olaparib discontinued -PET PSMA scan 01/11/2022-widespread intensely radiotracer avid sclerotic skeletal metastasis involving the axillary and appendicular skeleton.  Small-volume periaortic retroperitoneal radiotracer avid prostate cancer nodal metastasis.  Nonspecific activity in the prostate gland.  No liver or lung prostate cancer metastasis. -Palliative radiation to the right pelvis and proximal right femur 02/01/2022 -Pluvicto 03/14/2022 -PSA markedly improved at 160  on  04/18/2022 -Pluvicto 04/26/2022 -Pluvicto 06/06/2022 2.  Severe anemia-likely secondary to metastatic prostate cancer involving the bones 3.  Mild thrombocytopenia 4.  Cirrhosis with splenomegaly 5.  History of hepatitis C 6.  History of polysubstance abuse 7.  Depression 8.  Tobacco dependence 9.  Right arm weakness, right facial numbness-potentially related to nerve root compromise from metastatic bone lesions 10.  Pain secondary to #1 11.  Extensive bone metastases-every 75-month Zometa starting 04/01/2020 12.  Gout-acute flare right wrist 04/01/2020 treated with indomethacin, allopurinol resumed 13.  Right hepatic lobe mass with elevated AFP concerning for [facility] MRI abdomen 12/21/2020-hypoenhancing inferior right liver mass, segment 6, occluding or compressing the adjacent right portal vein branch, intrahepatic cholangiocarcinoma favored with differential including hepatocellular carcinoma, Li-Rads M Y 90 planning study 11-22 Y90 right lobe of liver 06/26/2021 14.  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  Presentation to the [facility] emergency room 09/16/2021 with chest pain-transfer to [facility] on a morphine drip and scheduled Ativan     Disposition: Edgar Mooney appears stable.  The PSA was stable on 06/21/2022.  He is scheduled for cycle 4 Pluvicto on 07/18/2022.  He will return for an office and lab visit on 07/30/2022.  I refilled his prescription for oxycodone today.  He understands that this prescription will need to last until 07/30/2022.  Betsy Coder, MD  07/11/2022  11:50 AM

## 2022-07-12 LAB — PROSTATE-SPECIFIC AG, SERUM (LABCORP): Prostate Specific Ag, Serum: 44.6 ng/mL — ABNORMAL HIGH (ref 0.0–4.0)

## 2022-07-14 ENCOUNTER — Other Ambulatory Visit (HOSPITAL_BASED_OUTPATIENT_CLINIC_OR_DEPARTMENT_OTHER): Payer: Self-pay

## 2022-07-17 NOTE — Written Directive (Addendum)
  PLUVICTO  THERAPY   RADIOPHARMACEUTICAL: Lutetium 177 vipivotide tetraxetan (Pluvicto)     PRESCRIBED DOSE FOR ADMINISTRATION: 150 mCi   ROUTE OFADMINISTRATION:  IV   DIAGNOSIS:  Prostate Cancer   REFERRING PHYSICIAN: Ned Card, NP   TREATMENT #: 4   ADDITIONAL PHYSICIAN COMMENTS/NOTES: Continue reduced dose   AUTHORIZED USER SIGNATURE & TIME STAMP: Rennis Golden, MD   07/17/22    1:42 PM

## 2022-07-18 ENCOUNTER — Ambulatory Visit (HOSPITAL_COMMUNITY)
Admission: RE | Admit: 2022-07-18 | Discharge: 2022-07-18 | Disposition: A | Discharge status: Home / Self Care | Payer: BC Managed Care – PPO | Source: Ambulatory Visit | Attending: Nurse Practitioner | Admitting: Nurse Practitioner

## 2022-07-18 ENCOUNTER — Inpatient Hospital Stay: Payer: BC Managed Care – PPO | Attending: Nurse Practitioner

## 2022-07-18 DIAGNOSIS — C61 Malignant neoplasm of prostate: Secondary | ICD-10-CM | POA: Diagnosis not present

## 2022-07-18 MED ORDER — SODIUM CHLORIDE 0.9 % IV BOLUS: 1000.0000 mL | Freq: Once | INTRAVENOUS | Status: DC

## 2022-07-18 MED ORDER — SODIUM CHLORIDE 0.9 % IV SOLN: INTRAVENOUS | Status: DC

## 2022-07-18 MED ORDER — LUTETIUM LU 177 VIPIVOTIDE TET 1000 MBQ/ML IV SOLN
151.0000 | Freq: Once | INTRAVENOUS | Status: AC
  Administered 2022-07-18: 150.4 via INTRAVENOUS

## 2022-07-18 NOTE — Progress Notes (Signed)
CLINICAL DATA: [Prostate carcinoma with extensive bone metastasis.]  Undergoing Lu- 177 PSMA therapy   EXAM: NUCLEAR MEDICINE PLUVICTO INJECTION  TECHNIQUE: Infusion: The nuclear medicine technologist and I personally verified the dose activity to be delivered as specified in the written directive, and verified the patient identification via 2 separate methods.  Initial flush of the intravenous catheter was performed was sterile saline. The dose syringe was connected to the catheter and the Lu-177 Pluvicto administered over a 1 to 10 min infusion. Single 10 cc  lushes with normal saline follow the dose. No complications were noted. The entire IV tubing, venocatheter, stopcock and syringes was removed in total, placed in a disposal bag and sent for assay of the residual activity, which will be reported at a later time in our EMR by the physics staff. Pressure was applied to the venipuncture site, and a compression bandage placed. Patient monitored for 1 hour following infusion.    Radiation Safety personnel were present to perform the discharge survey, as detailed on their documentation. After a short period of observation, the patient had his IV removed.  RADIOPHARMACEUTICALS: [Insert 150.4] microcuries Lu-177 PLUVICTO  FINDINGS: Current Infusion: [4]  Planned Infusions: 6    Patient presented to nuclear medicine for treatment. The patient's most recent blood counts were reviewed and remains a adequate candidate to proceed with Lu-177 Pluvicto.   Patient reports improvement bone pain.  Some fatigue.  Overall patient appears more healthy after initiation of therapy.   Patient hemoglobin has improved to  9.2 from a low 7.3.   Platelets stable at [place].   White blood cell count normalized.    No renal toxicity.   Most recent PSA slightly elevated to 44 increased from 19.3 however dramatically reduced from 1,700 prior to  therapy   While patient blood counts have improved, continue 25%  reduced dose.  The patient was situated in an infusion suite with a contact barrier placed under the arm. Intravenous access was established, using sterile technique, and a normal saline infusion from a syringe was started.     IMPRESSION: Current Infusion: [4]  Planned Infusions: 6  [The patient tolerated the infusion well. The patient will return in 6 weeks for ongoing care.]

## 2022-07-25 ENCOUNTER — Inpatient Hospital Stay: Payer: BC Managed Care – PPO | Admitting: Licensed Clinical Social Worker

## 2022-07-25 ENCOUNTER — Other Ambulatory Visit: Payer: Self-pay | Admitting: Oncology

## 2022-07-25 ENCOUNTER — Other Ambulatory Visit (HOSPITAL_BASED_OUTPATIENT_CLINIC_OR_DEPARTMENT_OTHER): Payer: Self-pay

## 2022-07-25 DIAGNOSIS — C61 Malignant neoplasm of prostate: Secondary | ICD-10-CM

## 2022-07-25 NOTE — Progress Notes (Signed)
[facility] Clinical Social Work  Clinical Social Work was referred by self for assessment of psychosocial needs.  Clinical Social Worker contacted patient by phone to offer support and assess for needs.    He stated he was on disability and Medicaid.  He will have to move from where he is staying at the end of the month.  He is independent with all of his ADLs.  He does not wish to go to a shelter and does not appear to be assisted living eligible.  Per his request, CSW to mail patient the list of boarding houses and low income housing.  He expressed no other needs.  CSW to follow up.     Dorothey Baseman, LCSW  Clinical Social Worker [facility]        Patient is participating in a Managed Medicaid Plan:  Yes

## 2022-07-26 ENCOUNTER — Other Ambulatory Visit (HOSPITAL_BASED_OUTPATIENT_CLINIC_OR_DEPARTMENT_OTHER): Payer: Self-pay

## 2022-07-26 ENCOUNTER — Other Ambulatory Visit: Payer: Self-pay | Admitting: Nurse Practitioner

## 2022-07-26 DIAGNOSIS — C61 Malignant neoplasm of prostate: Secondary | ICD-10-CM

## 2022-07-26 MED ORDER — OXYCODONE HCL 5 MG PO TABS
5.0000 mg | ORAL_TABLET | Freq: Three times a day (TID) | ORAL | 0 refills | Status: DC | PRN
Start: 2022-07-26 — End: 2022-08-13
  Filled 2022-07-26: qty 100, 17d supply, fill #0

## 2022-07-30 ENCOUNTER — Inpatient Hospital Stay: Payer: BC Managed Care – PPO

## 2022-07-30 ENCOUNTER — Inpatient Hospital Stay: Payer: BC Managed Care – PPO | Admitting: Nurse Practitioner

## 2022-08-13 ENCOUNTER — Encounter: Payer: Self-pay | Admitting: Nurse Practitioner

## 2022-08-13 ENCOUNTER — Other Ambulatory Visit: Payer: Self-pay | Admitting: Nurse Practitioner

## 2022-08-13 ENCOUNTER — Inpatient Hospital Stay: Payer: BC Managed Care – PPO

## 2022-08-13 ENCOUNTER — Other Ambulatory Visit: Payer: Self-pay

## 2022-08-13 ENCOUNTER — Other Ambulatory Visit (HOSPITAL_BASED_OUTPATIENT_CLINIC_OR_DEPARTMENT_OTHER): Payer: Self-pay

## 2022-08-13 ENCOUNTER — Inpatient Hospital Stay (HOSPITAL_BASED_OUTPATIENT_CLINIC_OR_DEPARTMENT_OTHER): Payer: BC Managed Care – PPO | Admitting: Nurse Practitioner

## 2022-08-13 VITALS — BP 132/74 | HR 73 | Temp 98.2°F | Resp 18 | Ht 62.0 in | Wt 130.6 lb

## 2022-08-13 DIAGNOSIS — C61 Malignant neoplasm of prostate: Secondary | ICD-10-CM

## 2022-08-13 DIAGNOSIS — C7951 Secondary malignant neoplasm of bone: Secondary | ICD-10-CM | POA: Insufficient documentation

## 2022-08-13 DIAGNOSIS — F17200 Nicotine dependence, unspecified, uncomplicated: Secondary | ICD-10-CM | POA: Diagnosis not present

## 2022-08-13 DIAGNOSIS — D649 Anemia, unspecified: Secondary | ICD-10-CM | POA: Insufficient documentation

## 2022-08-13 DIAGNOSIS — M1612 Unilateral primary osteoarthritis, left hip: Secondary | ICD-10-CM | POA: Insufficient documentation

## 2022-08-13 DIAGNOSIS — D696 Thrombocytopenia, unspecified: Secondary | ICD-10-CM | POA: Diagnosis not present

## 2022-08-13 DIAGNOSIS — Z8619 Personal history of other infectious and parasitic diseases: Secondary | ICD-10-CM | POA: Diagnosis not present

## 2022-08-13 DIAGNOSIS — R162 Hepatomegaly with splenomegaly, not elsewhere classified: Secondary | ICD-10-CM | POA: Diagnosis not present

## 2022-08-13 DIAGNOSIS — K7460 Unspecified cirrhosis of liver: Secondary | ICD-10-CM | POA: Insufficient documentation

## 2022-08-13 DIAGNOSIS — F32A Depression, unspecified: Secondary | ICD-10-CM | POA: Diagnosis not present

## 2022-08-13 LAB — CMP (CANCER CENTER ONLY)
ALT: 11 U/L (ref 0–44)
AST: 17 U/L (ref 15–41)
Albumin: 4.1 g/dL (ref 3.5–5.0)
Alkaline Phosphatase: 175 U/L — ABNORMAL HIGH (ref 38–126)
Anion gap: 7 (ref 5–15)
BUN: 21 mg/dL — ABNORMAL HIGH (ref 6–20)
CO2: 22 mmol/L (ref 22–32)
Calcium: 9 mg/dL (ref 8.9–10.3)
Chloride: 109 mmol/L (ref 98–111)
Creatinine: 0.6 mg/dL — ABNORMAL LOW (ref 0.61–1.24)
GFR, Estimated: >60 mL/min (ref >60)
Glucose, Bld: 133 mg/dL — ABNORMAL HIGH (ref 70–99)
Potassium: 4.5 mmol/L (ref 3.5–5.1)
Sodium: 138 mmol/L (ref 135–145)
Total Bilirubin: 0.6 mg/dL (ref 0.3–1.2)
Total Protein: 6.6 g/dL (ref 6.5–8.1)

## 2022-08-13 LAB — CBC WITH DIFFERENTIAL (CANCER CENTER ONLY)
Abs Immature Granulocytes: 0.04 10*3/uL (ref 0.00–0.07)
Basophils Absolute: 0 10*3/uL (ref 0.0–0.1)
Basophils Relative: 1 %
Eosinophils Absolute: 0.1 10*3/uL (ref 0.0–0.5)
Eosinophils Relative: 1 %
HCT: 26.4 % — ABNORMAL LOW (ref 39.0–52.0)
Hemoglobin: 8.7 g/dL — ABNORMAL LOW (ref 13.0–17.0)
Immature Granulocytes: 1 %
Lymphocytes Relative: 6 %
Lymphs Abs: 0.3 10*3/uL — ABNORMAL LOW (ref 0.7–4.0)
MCH: 32.1 pg (ref 26.0–34.0)
MCHC: 33 g/dL (ref 30.0–36.0)
MCV: 97.4 fL (ref 80.0–100.0)
Monocytes Absolute: 0.3 10*3/uL (ref 0.1–1.0)
Monocytes Relative: 7 %
Neutro Abs: 3.7 10*3/uL (ref 1.7–7.7)
Neutrophils Relative %: 84 %
Platelet Count: 84 10*3/uL — ABNORMAL LOW (ref 150–400)
RBC: 2.71 MIL/uL — ABNORMAL LOW (ref 4.22–5.81)
RDW: 14.4 % (ref 11.5–15.5)
WBC Count: 4.4 10*3/uL (ref 4.0–10.5)
nRBC: 0 % (ref 0.0–0.2)

## 2022-08-13 MED ORDER — OXYCODONE HCL 5 MG PO TABS
5.0000 mg | ORAL_TABLET | Freq: Three times a day (TID) | ORAL | 0 refills | Status: DC | PRN
Start: 2022-08-13 — End: 2022-08-27
  Filled 2022-08-13: qty 30, 5d supply, fill #0
  Filled 2022-08-13: qty 100, 17d supply, fill #0
  Filled 2022-08-13: qty 70, 12d supply, fill #0

## 2022-08-13 NOTE — Progress Notes (Signed)
[facility] OFFICE PROGRESS NOTE   Diagnosis: Prostate cancer  INTERVAL HISTORY:   Edgar Mooney returns for follow-up.  He completed treatment #4 with Pluvicto 07/18/2022.  Overall he is feeling well.  No nausea or vomiting.  Occasional loose stools.  Pain continues to be improved.  He takes oxycodone as needed.  For the past few months he has noted "knots" on his head.  Objective:  Vital signs in last 24 hours:  Blood pressure 132/74, pulse 73, temperature 98.2 F (36.8 C), temperature source Oral, resp. rate 18, height 5\' 2"  (1.575 m), weight 130 lb 9.6 oz (59.2 kg), SpO2 100 %.    HEENT: No thrush or oral burns. Resp: Lungs clear bilaterally. Cardio: Regular rate and rhythm. GI: No hepatosplenomegaly. Vascular: No leg edema. Neuro: Alert and oriented. Skin: Firm nodule upper frontal region and right parietal region.   Lab Results:  Lab Results  Component Value Date   WBC 4.1 07/11/2022   HGB 9.2 (L) 07/11/2022   HCT 27.3 (L) 07/11/2022   MCV 97.2 07/11/2022   PLT 77 (L) 07/11/2022   NEUTROABS 3.2 07/11/2022    Imaging:  No results found.  Medications: I have reviewed the patient's current medications.  Assessment/Plan: Metastatic prostate cancer - Lytic bone lesions with retroperitoneal adenopathy concerning for metastatic prostate cancer -03/03/2020-CTA chest/abdomen/pelvis widespread osseous metastatic disease and lower retroperitoneal adenopathy, pathologic right anterior third rib fracture, probable spinal canal tumor at the level of S1, -MRI cervical, thoracic, and lumbar spine 03/03/2020-diffuse osseous metastatic disease, ventral epidural tumor impinging on right S1 nerve root, extraosseous tumor at the bilateral ilium, asymmetric enhancing material at the right C5-6 canal-right  -03/03/2020-PSA 763 -Degarelix 03/04/2020 -Abiraterone/prednisone 03/14/2020 -Every 40-month Lupron 04/01/2020 -04/01/2020 PSA 36.5 -05/31/2020 PSA 1.2 -06/28/2020  PSA 1.0 -12/14/2020 PSA 6.4 -01/13/2021 PSA 10.6 -04/26/2021 PSA  45.2 -05/16/2021 PSA 70 -Abiraterone/prednisone discontinued 05/16/2021 -05/16/2021 left hip x-ray-multifocal sclerotic metastasis throughout the pelvis, confluent involving the left acetabulum.  No acute or pathologic fracture.  Left hip osteoarthritis. -Cycle 1 docetaxel 05/31/2021 -05/31/2021 PSA 113 -Palliative radiation to the left acetabulum 06/08/2021 - 06/20/2021 -Cycle 2 docetaxel 06/21/2021 -06/21/2021 PSA improved at 68 -Cycle 3 docetaxel 07/12/2021 -07/12/2021 PSA improved at 52 -Cycle 4 docetaxel 08/02/2021 -Cycle 5 docetaxel 08/22/2021 -CT chest 09/16/2021-negative for pulmonary embolism, right liver mass, extensive bone metastases -Cycle 6 docetaxel 09/28/2021 -PSA increased 09/28/2021 -Olaparib 10/24/2021, patient decreased dose to 1 tablet twice daily on 10/31/2021 due to muscle spasms; dose resumed at prescribed amount 11/06/2021 -PSA increased 11/24/2021 -PSA further increased 12/08/2021 -Olaparib discontinued -PET PSMA scan 01/11/2022-widespread intensely radiotracer avid sclerotic skeletal metastasis involving the axillary and appendicular skeleton.  Small-volume periaortic retroperitoneal radiotracer avid prostate cancer nodal metastasis.  Nonspecific activity in the prostate gland.  No liver or lung prostate cancer metastasis. -Palliative radiation to the right pelvis and proximal right femur 02/01/2022 -Pluvicto 03/14/2022 -PSA markedly improved at 160 on 04/18/2022 -Pluvicto 04/26/2022 -Pluvicto 06/06/2022 -Pluvicto 07/18/2022 2.  Severe anemia-likely secondary to metastatic prostate cancer involving the bones 3.  Mild thrombocytopenia 4.  Cirrhosis with splenomegaly 5.  History of hepatitis C 6.  History of polysubstance abuse 7.  Depression 8.  Tobacco dependence 9.  Right arm weakness, right facial numbness-potentially related to nerve root compromise from metastatic bone lesions 10.  Pain secondary to #1 11.  Extensive  bone metastases-every 5-month Zometa starting 04/01/2020 12.  Gout-acute flare right wrist 04/01/2020 treated with indomethacin, allopurinol resumed 13.  Right hepatic lobe mass with elevated AFP  concerning for [facility] MRI abdomen 12/21/2020-hypoenhancing inferior right liver mass, segment 6, occluding or compressing the adjacent right portal vein branch, intrahepatic cholangiocarcinoma favored with differential including hepatocellular carcinoma, Li-Rads M Y 90 planning study 11-22 Y90 right lobe of liver 06/26/2021 14.  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  Presentation to the [facility] emergency room 09/16/2021 with chest pain-transfer to [facility] on a morphine drip and scheduled Ativan    Disposition: Edgar Mooney appears stable.  He has completed 4 treatments with Pluvicto.  Pain continues to be improved.  Last PSA was higher.  He has new scalp nodules.  We will follow-up on the PSA from today.  Next Pluvicto scheduled in 2 weeks.  He will return for follow-up in 1 month.  We are available to see him sooner if needed.  Lonna Cobb ANP/GNP-BC   08/13/2022  1:09 PM

## 2022-08-15 LAB — PROSTATE-SPECIFIC AG, SERUM (LABCORP): Prostate Specific Ag, Serum: 149 ng/mL — ABNORMAL HIGH (ref 0.0–4.0)

## 2022-08-16 ENCOUNTER — Telehealth: Payer: Self-pay | Admitting: Oncology

## 2022-08-22 ENCOUNTER — Inpatient Hospital Stay: Payer: BC Managed Care – PPO | Attending: Nurse Practitioner

## 2022-08-23 NOTE — Written Directive (Addendum)
MOLECULAR IMAGING AND THERAPEUTICS WRITTEN DIRECTIVE   PATIENT NAME: Edgar Mooney  PT DOB:   1966-01-29                                              MRN: [id number]  ---------------------------------------------------------------------------------------------------------------------   PLUVICTO  THERAPY   RADIOPHARMACEUTICAL: Lutetium 177 vipivotide tetraxetan (Pluvicto)     PRESCRIBED DOSE FOR ADMINISTRATION:  175 mCi   ROUTE OFADMINISTRATION:  IV   DIAGNOSIS:  Prostate Cancer    REFERRING PHYSICIAN: Lonna Cobb, NP   TREATMENT #: [id number]   ADDITIONAL PHYSICIAN COMMENTS/NOTES:   AUTHORIZED USER SIGNATURE & TIME STAMP: Patriciaann Clan, MD   08/24/22    9:37 AM

## 2022-08-27 ENCOUNTER — Other Ambulatory Visit: Payer: Self-pay | Admitting: Nurse Practitioner

## 2022-08-27 ENCOUNTER — Telehealth: Payer: Self-pay | Admitting: Oncology

## 2022-08-27 ENCOUNTER — Other Ambulatory Visit (HOSPITAL_BASED_OUTPATIENT_CLINIC_OR_DEPARTMENT_OTHER): Payer: Self-pay

## 2022-08-27 DIAGNOSIS — C61 Malignant neoplasm of prostate: Secondary | ICD-10-CM

## 2022-08-27 MED ORDER — OXYCODONE HCL 5 MG PO TABS
5.0000 mg | ORAL_TABLET | Freq: Three times a day (TID) | ORAL | 0 refills | Status: DC | PRN
Start: 2022-08-27 — End: 2022-09-11
  Filled 2022-08-27 – 2022-08-29 (×3): qty 100, 17d supply, fill #0

## 2022-08-28 ENCOUNTER — Other Ambulatory Visit (HOSPITAL_BASED_OUTPATIENT_CLINIC_OR_DEPARTMENT_OTHER): Payer: Self-pay

## 2022-08-28 ENCOUNTER — Inpatient Hospital Stay: Payer: BC Managed Care – PPO

## 2022-08-28 MED ORDER — SODIUM CHLORIDE 0.9 % IV SOLN: INTRAVENOUS | Status: AC

## 2022-08-29 ENCOUNTER — Other Ambulatory Visit (HOSPITAL_BASED_OUTPATIENT_CLINIC_OR_DEPARTMENT_OTHER): Payer: Self-pay

## 2022-08-29 ENCOUNTER — Inpatient Hospital Stay (HOSPITAL_COMMUNITY)
Admission: RE | Admit: 2022-08-29 | Discharge: 2022-08-29 | Disposition: A | Discharge status: Home / Self Care | Payer: BC Managed Care – PPO | Source: Ambulatory Visit | Attending: Nurse Practitioner | Admitting: Nurse Practitioner

## 2022-08-29 DIAGNOSIS — C61 Malignant neoplasm of prostate: Secondary | ICD-10-CM

## 2022-09-03 ENCOUNTER — Other Ambulatory Visit (HOSPITAL_BASED_OUTPATIENT_CLINIC_OR_DEPARTMENT_OTHER): Payer: Self-pay

## 2022-09-05 ENCOUNTER — Emergency Department (HOSPITAL_COMMUNITY): Payer: BC Managed Care – PPO

## 2022-09-05 ENCOUNTER — Encounter (HOSPITAL_COMMUNITY): Payer: Self-pay | Admitting: Internal Medicine

## 2022-09-05 ENCOUNTER — Other Ambulatory Visit: Payer: Self-pay

## 2022-09-05 ENCOUNTER — Inpatient Hospital Stay (HOSPITAL_COMMUNITY)
Admission: EM | Admit: 2022-09-05 | Discharge: 2022-09-11 | DRG: 080 | Disposition: A | Discharge status: Hospice | Payer: BC Managed Care – PPO | Attending: Student | Admitting: Student

## 2022-09-05 DIAGNOSIS — I252 Old myocardial infarction: Secondary | ICD-10-CM

## 2022-09-05 DIAGNOSIS — G935 Compression of brain: Secondary | ICD-10-CM | POA: Diagnosis present

## 2022-09-05 DIAGNOSIS — G893 Neoplasm related pain (acute) (chronic): Secondary | ICD-10-CM | POA: Diagnosis present

## 2022-09-05 DIAGNOSIS — R161 Splenomegaly, not elsewhere classified: Secondary | ICD-10-CM | POA: Diagnosis present

## 2022-09-05 DIAGNOSIS — F17210 Nicotine dependence, cigarettes, uncomplicated: Secondary | ICD-10-CM | POA: Diagnosis present

## 2022-09-05 DIAGNOSIS — D649 Anemia, unspecified: Secondary | ICD-10-CM | POA: Diagnosis not present

## 2022-09-05 DIAGNOSIS — F32A Depression, unspecified: Secondary | ICD-10-CM | POA: Diagnosis not present

## 2022-09-05 DIAGNOSIS — F17200 Nicotine dependence, unspecified, uncomplicated: Secondary | ICD-10-CM | POA: Diagnosis not present

## 2022-09-05 DIAGNOSIS — Z923 Personal history of irradiation: Secondary | ICD-10-CM

## 2022-09-05 DIAGNOSIS — Z515 Encounter for palliative care: Secondary | ICD-10-CM

## 2022-09-05 DIAGNOSIS — Z955 Presence of coronary angioplasty implant and graft: Secondary | ICD-10-CM

## 2022-09-05 DIAGNOSIS — I10 Essential (primary) hypertension: Secondary | ICD-10-CM | POA: Diagnosis present

## 2022-09-05 DIAGNOSIS — Z66 Do not resuscitate: Secondary | ICD-10-CM | POA: Insufficient documentation

## 2022-09-05 DIAGNOSIS — R066 Hiccough: Secondary | ICD-10-CM | POA: Diagnosis present

## 2022-09-05 DIAGNOSIS — K7460 Unspecified cirrhosis of liver: Secondary | ICD-10-CM | POA: Diagnosis not present

## 2022-09-05 DIAGNOSIS — M25561 Pain in right knee: Secondary | ICD-10-CM | POA: Diagnosis not present

## 2022-09-05 DIAGNOSIS — C7940 Secondary malignant neoplasm of unspecified part of nervous system: Secondary | ICD-10-CM

## 2022-09-05 DIAGNOSIS — F1910 Other psychoactive substance abuse, uncomplicated: Secondary | ICD-10-CM | POA: Diagnosis present

## 2022-09-05 DIAGNOSIS — G9389 Other specified disorders of brain: Secondary | ICD-10-CM | POA: Diagnosis not present

## 2022-09-05 DIAGNOSIS — G936 Cerebral edema: Secondary | ICD-10-CM | POA: Diagnosis not present

## 2022-09-05 DIAGNOSIS — Z7189 Other specified counseling: Secondary | ICD-10-CM | POA: Diagnosis not present

## 2022-09-05 DIAGNOSIS — D6959 Other secondary thrombocytopenia: Secondary | ICD-10-CM | POA: Diagnosis not present

## 2022-09-05 DIAGNOSIS — C7931 Secondary malignant neoplasm of brain: Secondary | ICD-10-CM | POA: Diagnosis present

## 2022-09-05 DIAGNOSIS — F1410 Cocaine abuse, uncomplicated: Secondary | ICD-10-CM | POA: Diagnosis not present

## 2022-09-05 DIAGNOSIS — D638 Anemia in other chronic diseases classified elsewhere: Secondary | ICD-10-CM | POA: Diagnosis present

## 2022-09-05 DIAGNOSIS — M1612 Unilateral primary osteoarthritis, left hip: Secondary | ICD-10-CM | POA: Diagnosis present

## 2022-09-05 DIAGNOSIS — C61 Malignant neoplasm of prostate: Secondary | ICD-10-CM | POA: Diagnosis present

## 2022-09-05 DIAGNOSIS — C7949 Secondary malignant neoplasm of other parts of nervous system: Secondary | ICD-10-CM | POA: Diagnosis not present

## 2022-09-05 DIAGNOSIS — M109 Gout, unspecified: Secondary | ICD-10-CM | POA: Diagnosis present

## 2022-09-05 DIAGNOSIS — B182 Chronic viral hepatitis C: Secondary | ICD-10-CM | POA: Diagnosis present

## 2022-09-05 DIAGNOSIS — C7951 Secondary malignant neoplasm of bone: Secondary | ICD-10-CM | POA: Diagnosis not present

## 2022-09-05 DIAGNOSIS — E440 Moderate protein-calorie malnutrition: Secondary | ICD-10-CM | POA: Diagnosis not present

## 2022-09-05 DIAGNOSIS — B370 Candidal stomatitis: Secondary | ICD-10-CM | POA: Diagnosis not present

## 2022-09-05 DIAGNOSIS — R54 Age-related physical debility: Secondary | ICD-10-CM | POA: Diagnosis present

## 2022-09-05 DIAGNOSIS — M25511 Pain in right shoulder: Secondary | ICD-10-CM | POA: Diagnosis not present

## 2022-09-05 LAB — CBC WITH DIFFERENTIAL/PLATELET
Abs Immature Granulocytes: 0.04 10*3/uL (ref 0.00–0.07)
Basophils Absolute: 0 10*3/uL (ref 0.0–0.1)
Basophils Relative: 0 %
Eosinophils Absolute: 0 10*3/uL (ref 0.0–0.5)
Eosinophils Relative: 0 %
HCT: 23.7 % — ABNORMAL LOW (ref 39.0–52.0)
Hemoglobin: 7.9 g/dL — ABNORMAL LOW (ref 13.0–17.0)
Immature Granulocytes: 1 %
Lymphocytes Relative: 4 %
Lymphs Abs: 0.2 10*3/uL — ABNORMAL LOW (ref 0.7–4.0)
MCH: 30.3 pg (ref 26.0–34.0)
MCHC: 33.3 g/dL (ref 30.0–36.0)
MCV: 90.8 fL (ref 80.0–100.0)
Monocytes Absolute: 0.5 10*3/uL (ref 0.1–1.0)
Monocytes Relative: 9 %
Neutro Abs: 4.1 10*3/uL (ref 1.7–7.7)
Neutrophils Relative %: 86 %
Platelets: 100 10*3/uL — ABNORMAL LOW (ref 150–400)
RBC: 2.61 MIL/uL — ABNORMAL LOW (ref 4.22–5.81)
RDW: 14.3 % (ref 11.5–15.5)
WBC: 4.8 10*3/uL (ref 4.0–10.5)
nRBC: 0 % (ref 0.0–0.2)

## 2022-09-05 LAB — COMPREHENSIVE METABOLIC PANEL
ALT: 10 U/L (ref 0–44)
AST: 29 U/L (ref 15–41)
Albumin: 3 g/dL — ABNORMAL LOW (ref 3.5–5.0)
Alkaline Phosphatase: 139 U/L — ABNORMAL HIGH (ref 38–126)
Anion gap: 11 (ref 5–15)
BUN: 22 mg/dL — ABNORMAL HIGH (ref 6–20)
CO2: 19 mmol/L — ABNORMAL LOW (ref 22–32)
Calcium: 8.6 mg/dL — ABNORMAL LOW (ref 8.9–10.3)
Chloride: 102 mmol/L (ref 98–111)
Creatinine, Ser: 0.87 mg/dL (ref 0.61–1.24)
GFR, Estimated: >60 mL/min (ref >60)
Glucose, Bld: 145 mg/dL — ABNORMAL HIGH (ref 70–99)
Potassium: 3.5 mmol/L (ref 3.5–5.1)
Sodium: 132 mmol/L — ABNORMAL LOW (ref 135–145)
Total Bilirubin: 1.1 mg/dL (ref 0.3–1.2)
Total Protein: 6.6 g/dL (ref 6.5–8.1)

## 2022-09-05 LAB — HIV ANTIBODY (ROUTINE TESTING W REFLEX): HIV Screen 4th Generation wRfx: NONREACTIVE

## 2022-09-05 MED ORDER — BISACODYL 5 MG PO TBEC: 5.0000 mg | DELAYED_RELEASE_TABLET | Freq: Every day | ORAL | Status: DC | PRN

## 2022-09-05 MED ORDER — PROCHLORPERAZINE EDISYLATE 10 MG/2ML IJ SOLN: 5.0000 mg | INTRAMUSCULAR | Status: DC | PRN

## 2022-09-05 MED ORDER — POLYETHYLENE GLYCOL 3350 17 G PO PACK: 17.0000 g | PACK | Freq: Every day | ORAL | Status: DC | PRN

## 2022-09-05 MED ORDER — SODIUM CHLORIDE 0.9% FLUSH
3.0000 mL | Freq: Two times a day (BID) | INTRAVENOUS | Status: DC
  Administered 2022-09-05 – 2022-09-11 (×12): 3 mL via INTRAVENOUS

## 2022-09-05 MED ORDER — ACETAMINOPHEN 650 MG RE SUPP: 650.0000 mg | Freq: Four times a day (QID) | RECTAL | Status: DC | PRN

## 2022-09-05 MED ORDER — HYDROMORPHONE HCL 1 MG/ML IJ SOLN
0.5000 mg | INTRAMUSCULAR | Status: DC | PRN
  Administered 2022-09-05 – 2022-09-10 (×13): 1 mg via INTRAVENOUS
  Administered 2022-09-10: 0.5 mg via INTRAVENOUS
  Administered 2022-09-10 (×2): 1 mg via INTRAVENOUS
  Filled 2022-09-05 (×18): qty 1

## 2022-09-05 MED ORDER — DEXAMETHASONE 4 MG PO TABS
4.0000 mg | ORAL_TABLET | Freq: Four times a day (QID) | ORAL | Status: DC
  Administered 2022-09-05 – 2022-09-11 (×24): 4 mg via ORAL
  Filled 2022-09-05 (×24): qty 1

## 2022-09-05 MED ORDER — LACTATED RINGERS IV SOLN: INTRAVENOUS | Status: DC

## 2022-09-05 MED ORDER — HYDRALAZINE HCL 20 MG/ML IJ SOLN: 5.0000 mg | INTRAMUSCULAR | Status: DC | PRN

## 2022-09-05 MED ORDER — LEVETIRACETAM 500 MG PO TABS
500.0000 mg | ORAL_TABLET | Freq: Two times a day (BID) | ORAL | Status: DC
  Administered 2022-09-05 – 2022-09-11 (×14): 500 mg via ORAL
  Filled 2022-09-05 (×14): qty 1

## 2022-09-05 MED ORDER — HYDROMORPHONE HCL 1 MG/ML IJ SOLN
1.0000 mg | Freq: Once | INTRAMUSCULAR | Status: AC
  Administered 2022-09-05: 1 mg via INTRAVENOUS
  Filled 2022-09-05: qty 1

## 2022-09-05 MED ORDER — DOCUSATE SODIUM 100 MG PO CAPS
100.0000 mg | ORAL_CAPSULE | Freq: Two times a day (BID) | ORAL | Status: DC
  Administered 2022-09-05 – 2022-09-11 (×13): 100 mg via ORAL
  Filled 2022-09-05 (×13): qty 1

## 2022-09-05 MED ORDER — ONDANSETRON HCL 4 MG/2ML IJ SOLN: 4.0000 mg | Freq: Four times a day (QID) | INTRAMUSCULAR | Status: DC | PRN

## 2022-09-05 MED ORDER — ONDANSETRON HCL 4 MG/2ML IJ SOLN
4.0000 mg | Freq: Once | INTRAMUSCULAR | Status: AC
  Administered 2022-09-05: 4 mg via INTRAVENOUS
  Filled 2022-09-05: qty 2

## 2022-09-05 MED ORDER — ALBUTEROL SULFATE (2.5 MG/3ML) 0.083% IN NEBU: 2.5000 mg | INHALATION_SOLUTION | RESPIRATORY_TRACT | Status: DC | PRN

## 2022-09-05 MED ORDER — ONDANSETRON HCL 4 MG PO TABS: 4.0000 mg | ORAL_TABLET | Freq: Four times a day (QID) | ORAL | Status: DC | PRN

## 2022-09-05 MED ORDER — ACETAMINOPHEN 325 MG PO TABS
650.0000 mg | ORAL_TABLET | Freq: Four times a day (QID) | ORAL | Status: DC | PRN
  Administered 2022-09-08 – 2022-09-09 (×2): 650 mg via ORAL
  Filled 2022-09-05: qty 2

## 2022-09-05 MED ORDER — OXYCODONE HCL 5 MG PO TABS
5.0000 mg | ORAL_TABLET | ORAL | Status: DC | PRN
  Administered 2022-09-05 – 2022-09-06 (×2): 5 mg via ORAL
  Filled 2022-09-05 (×2): qty 1

## 2022-09-05 NOTE — H&P (Signed)
History and Physical    Patient: Edgar Mooney QMV:[id number] DOB: 1965/06/17 DOA: 09/05/2022 DOS: the patient was seen and examined on 09/05/2022 PCP: Pcp, No  Patient coming from: Home - lives with a friend; NOK: Son, Dolton Lardner, incorrect number   Chief Complaint: Shoulder pain  HPI: Edgar KISSELL is a 57 y.o. male with medical history significant of polysubstance abuse, Hep C, and metastatic prostate CA presenting with B shoulder pain. He acknowledges B shoulder pain and diffuse pain along with worsening forehead/scalp nodules.  He is quite somnolent this AM and able to answer some questions.  I was also unable to reach family.  He was last seen in the ER on 09/16/21.  At that time, he was thought to be actively dying and was transferred to [facility] for comfort care.  However, his family decided to cancel comfort care and the patient discharged to home with his son on 6/9.  He resumed treatment of his prostate cancer and also completed palliative radiation.    He has continued treatment through 07/18/22.  At his last oncology visit on 4/29 he reported feeling well with improved pain although he did note "knots" on his head, reported to be new scalp nodules.  His PSA, which had dropped from 1070 to 18.5 was up to 149 on 4/29.   ER Course:  Carryover, per Dr. Antionette Char:  Atraumatic shoulder and knee pain, and enlarging forehead mass. Workup demonstrates widespread osseous metastatic disease and large area of vasogenic edema in the right frontal lobe with midline shift and early herniation of cingulate gyrus. Neurosurgery [facility]) was consulted and recommended Decadron and seizure prophylaxis. ED also consulted palliative care. Patient was unwilling to discuss code status in ED.        Review of Systems: As mentioned in the history of present illness. All other systems reviewed and are negative.  Limited by cognition.  Past Medical History:  Diagnosis Date   Alcohol abuse    Cocaine abuse  (HCC)    Depression    Gout    Hep C w/o coma, chronic (HCC) diagnosed May 2016   History of radiation therapy    left hip 06/08/2021-06/20/2021  Dr Antony Blackbird   History of radiation therapy    Right Hip(Pelvis) 02/01/22-02/14/22-Dr. Antony Blackbird   Monoallelic mutation of ATM gene 28/41/3244   Prostate cancer metastatic to bone [facility])    Past Surgical History:  Procedure Laterality Date   CORONARY STENT INTERVENTION N/A 12/21/2020   Procedure: CORONARY STENT INTERVENTION;  Surgeon: Lennette Bihari, MD;  Location: [facility] INVASIVE CV LAB;  Service: Cardiovascular;  Laterality: N/A;   CORONARY STENT INTERVENTION N/A 12/22/2020   Procedure: CORONARY STENT INTERVENTION;  Surgeon: Kathleene Hazel, MD;  Location: [facility] INVASIVE CV LAB;  Service: Cardiovascular;  Laterality: N/A;   IR 3D INDEPENDENT WKST  02/15/2021   IR ANGIOGRAM SELECTIVE EACH ADDITIONAL VESSEL  02/15/2021   IR ANGIOGRAM SELECTIVE EACH ADDITIONAL VESSEL  02/15/2021   IR ANGIOGRAM SELECTIVE EACH ADDITIONAL VESSEL  06/26/2021   IR ANGIOGRAM SELECTIVE EACH ADDITIONAL VESSEL  06/28/2021   IR ANGIOGRAM VISCERAL SELECTIVE  02/15/2021   IR ANGIOGRAM VISCERAL SELECTIVE  02/15/2021   IR ANGIOGRAM VISCERAL SELECTIVE  06/26/2021   IR EMBO TUMOR ORGAN ISCHEMIA INFARCT INC GUIDE ROADMAPPING  06/26/2021   IR RADIOLOGIST EVAL & MGMT  01/11/2021   IR RADIOLOGIST EVAL & MGMT  07/27/2021   IR US GUIDE VASC ACCESS RIGHT  02/15/2021   IR US GUIDE  VASC ACCESS RIGHT  06/26/2021   LEFT HEART CATH AND CORONARY ANGIOGRAPHY N/A 12/21/2020   Procedure: LEFT HEART CATH AND CORONARY ANGIOGRAPHY;  Surgeon: Lennette Bihari, MD;  Location: [facility] INVASIVE CV LAB;  Service: Cardiovascular;  Laterality: N/A;   LEFT HEART CATH AND CORONARY ANGIOGRAPHY N/A 08/08/2021   Procedure: LEFT HEART CATH AND CORONARY ANGIOGRAPHY;  Surgeon: Kathleene Hazel, MD;  Location: [facility] INVASIVE CV LAB;  Service: Cardiovascular;  Laterality: N/A;   Social History:  reports that he has been  smoking cigarettes. He has a 26.00 pack-year smoking history. He has never used smokeless tobacco. He reports that he does not currently use alcohol after a past usage of about 126.0 standard drinks of alcohol per week. He reports current drug use. Drugs: Cocaine, IV, Heroin, and Marijuana.  No Known Allergies  Family History  Problem Relation Age of Onset   Cancer Mother    Cancer Father     Prior to Admission medications   Medication Sig Start Date End Date Taking? Authorizing Provider  allopurinol (ZYLOPRIM) 100 MG tablet TAKE 1 TABLET BY MOUTH EVERY DAY Patient not taking: Reported on 06/21/2022 12/29/21   Rana Snare, NP  amLODipine (NORVASC) 5 MG tablet Take 1 tablet (5 mg total) by mouth daily. Patient not taking: Reported on 06/21/2022 08/02/21   Wendall Stade, MD  aspirin 81 MG chewable tablet Chew 1 tablet (81 mg total) by mouth daily. Patient not taking: Reported on 06/21/2022 12/29/21   Rana Snare, NP  dexamethasone (DECADRON) 4 MG tablet Take 1 tablet (4 mg total) by mouth daily. Patient not taking: Reported on 06/21/2022 03/12/22   Rana Snare, NP  docusate sodium (COLACE) 100 MG capsule Take 1 capsule (100 mg total) by mouth 2 (two) times daily. Patient not taking: Reported on 06/21/2022 02/06/22   Ladene Artist, MD  fluconazole (DIFLUCAN) 100 MG tablet Take 1 tablet (100 mg total) by mouth daily. Patient not taking: Reported on 06/21/2022 02/06/22   Ladene Artist, MD  nitroGLYCERIN (NITROSTAT) 0.4 MG SL tablet Place 1 tablet (0.4 mg total) under the tongue every 5 (five) minutes as needed for chest pain. Patient not taking: Reported on 11/24/2021 12/23/20   Marinda Elk, MD  ondansetron (ZOFRAN) 8 MG tablet Take 1 tablet (8 mg total) by mouth every 8 (eight) hours as needed for nausea or vomiting. Patient not taking: Reported on 06/21/2022 12/08/21   Rana Snare, NP  oxyCODONE (OXY IR/ROXICODONE) 5 MG immediate release tablet Take 1-2 tablets (5-10 mg total) by  mouth every 8 (eight) hours as needed for severe pain. 08/27/22   Rana Snare, NP  polyethylene glycol powder (MIRALAX) 17 GM/SCOOP powder Take 17 grams mixed in liquid by mouth daily. Patient not taking: Reported on 06/21/2022 02/06/22   Ladene Artist, MD  potassium chloride (KLOR-CON M) 10 MEQ tablet Take 1 tablet (10 mEq total) by mouth daily. Patient not taking: Reported on 06/21/2022 03/19/22   Rana Snare, NP  prochlorperazine (COMPAZINE) 10 MG tablet Take 1 tablet (10 mg total) by mouth every 6 (six) hours as needed for nausea. Patient not taking: Reported on 02/06/2022 10/23/21   Ladene Artist, MD  rosuvastatin (CRESTOR) 20 MG tablet Take 1 tablet (20 mg total) by mouth daily. Patient not taking: Reported on 06/21/2022 08/02/21   Wendall Stade, MD  tamsulosin (FLOMAX) 0.4 MG CAPS capsule Take 1 capsule (0.4 mg total) by mouth daily. Patient not taking:  Reported on 06/21/2022 11/30/21   Ladene Artist, MD    Physical Exam: Vitals:   09/05/22 0415 09/05/22 0624 09/05/22 0800 09/05/22 1100  BP: 136/74 121/77  129/74  Pulse: 86 81 79   Resp: 18 18  16   Temp:  97.9 F (36.6 C)    TempSrc:  Oral Oral   SpO2: 100% 100%     General:  Appears somnolent, somewhat confused Eyes:   EOMI, normal lids, iris ENT:  grossly normal hearing, lips & tongue, mildly dry mm; edentulous Neck:  no LAD, masses or thyromegaly Cardiovascular:  RRR, no m/r/g. No LE edema.  Respiratory:   CTA bilaterally with no wheezes/rales/rhonchi.  Normal respiratory effort. Abdomen:  soft, NT, ND Skin:  no rash or induration seen on limited exam; various scalp nodularities along the forehead   Musculoskeletal:  grossly normal tone BUE/BLE, good ROM, no bony abnormality Psychiatric:  blunted/somnolent mood and affect, speech sparse, ?cognitive impairment Neurologic:  CN 2-12 grossly intact, moves all extremities in coordinated fashion   Radiological Exams on Admission: Independently reviewed - see discussion  in A/P where applicable  CT HEAD WO CONTRAST ( )  Result Date: 09/05/2022 CLINICAL DATA:  Metastatic disease evaluation EXAM: CT HEAD WITHOUT CONTRAST TECHNIQUE: Contiguous axial images were obtained from the base of the skull through the vertex without intravenous contrast. RADIATION DOSE REDUCTION: This exam was performed according to the departmental dose-optimization program which includes automated exposure control, adjustment of the mA and/or kV according to patient size and/or use of iterative reconstruction technique. COMPARISON:  None Available. FINDINGS: Brain: There is a large amount of vasogenic edema within the right frontal lobe effacing the right lateral ventricle and causing leftward midline shift of 10 mm. There are lobulated areas of hyperdensity along the right convexity that could represent subdural blood or dural based tumor. No hydrocephalus. Vascular: No hyperdense vessel or unexpected calcification. Skull: Diffuse sclerotic metastatic disease of the calvarium. There is marked extracranial soft tissue thickening of the high right parietal calvarium. Sinuses/Orbits: No acute finding. Other: None. IMPRESSION: 1. Large amount of vasogenic edema within the right frontal lobe effacing the right lateral ventricle and causing leftward midline shift of 10 mm and early subfalcine herniation of the cingulate gyrus. 2. Lobulated areas of hyperdensity along the right convexity that could represent subdural blood or dural based tumor (favored). 3. Diffuse sclerotic metastatic disease of the calvarium with marked extracranial soft tissue thickening of the high right parietal calvarium. Critical Value/emergent results were called by telephone at the time of interpretation on 09/05/2022 at 3:06 am to provider [facility] , who verbally acknowledged these results. Electronically Signed   By: Deatra Robinson M.D.   On: 09/05/2022 03:06   DG Knee Complete 4 Views Right  Result Date:  09/05/2022 CLINICAL DATA:  Right knee pain, metastatic prostate cancer EXAM: RIGHT KNEE - COMPLETE 4+ VIEW COMPARISON:  None Available. FINDINGS: Patchy sclerosis is seen throughout the distal femur and proximal tibia and fibula most in keeping with changes of osteoblastic metastatic disease. Normal alignment. No pathologic fracture. Mild medial compartment degenerative arthritis. No effusion. Soft tissues are unremarkable. IMPRESSION: 1. Osteoblastic metastatic disease. No pathologic fracture. 2. Mild medial compartment degenerative arthritis. Electronically Signed   By: Helyn Numbers M.D.   On: 09/05/2022 03:00   DG Shoulder Right  Result Date: 09/05/2022 CLINICAL DATA:  Right shoulder pain EXAM: RIGHT SHOULDER - 2+ VIEW COMPARISON:  None Available. FINDINGS: The osseous structures of the right shoulder as well  as the right thoracic cage are are diffusely sclerotic in keeping with widespread osseous metastatic disease in this patient with known underlying metastatic prostate cancer. No superimposed pathologic fracture. Acromioclavicular and glenohumeral joint spaces appear preserved. IMPRESSION: 1. Widespread osseous metastatic disease. No superimposed pathologic fracture. Electronically Signed   By: Helyn Numbers M.D.   On: 09/05/2022 02:58    EKG: Independently reviewed.  NSR with rate 79; LVH; nonspecific ST changes with no evidence of acute ischemia   Labs on Admission: I have personally reviewed the available labs and imaging studies at the time of the admission.  Pertinent labs:    Na++ 132 CO2 19 Glucose 145 BUN 22 - stable AP 139 Albumin 3.0 WBC 4.8 Hgb 7.9 Platelets 100   Assessment and Plan: Principal Problem:   Brain mass Active Problems:   Polysubstance abuse (HCC)   Prostate cancer metastatic to bone (HCC)   Tobacco dependence    Metastatic prostate CA -Patient with known advanced metastatic CA presenting with pain -He was previously referred to residential hospice  in 09/2021 but family revoked this and he resumed treatments -Presented with shoulder pain and shoulder xray with widespread osseous metastatic disease -Also with knee pain and knee xray with osteoblastic metastatic disease -Has scalp nodules and head CT showed a large amount of vasogenic edema with early subfalcine herniation of the cingulate gyrus with concern for dural based tumor as well as diffuse metastatic disease of the calvarium -Patient has had progressive disease but GOC are uncertain -He was admitted to progressive care overnight as a carryover; will transfer to med surg given hemodynamic stability -Patient was d/w Dr. Danielle Dess, who reported no need for neurosurgical intervention and recommended Decadron and seizure prophylaxis -Will add Dr. Truett Perna to the treatment team -Palliative care consulted -I attempted to call his son but the reported number was ineffective -I attempted to discuss code status with the patient but he seemed to be somewhat confused about this  H/o polysubstance abuse -Routinely positive UDS during all previous checks -Need to monitor for withdrawal  Tobacco dependence -Encourage cessation.   -Patch ordered     Advance Care Planning:   Code Status: Full Code - unable to effectively discuss with patient at the time of admission  Consults: Neurosurgery by telephone only; palliative care; Oncology; [facility] team  DVT Prophylaxis: SCDs  Family Communication: None present; I was unable to reach family by telephone at the time of admission  Severity of Illness: The appropriate patient status for this patient is INPATIENT. Inpatient status is judged to be reasonable and necessary in order to provide the required intensity of service to ensure the patient's safety. The patient's presenting symptoms, physical exam findings, and initial radiographic and laboratory data in the context of their chronic comorbidities is felt to place them at high risk for further clinical  deterioration. Furthermore, it is not anticipated that the patient will be medically stable for discharge from the hospital within 2 midnights of admission.   * I certify that at the point of admission it is my clinical judgment that the patient will require inpatient hospital care spanning beyond 2 midnights from the point of admission due to high intensity of service, high risk for further deterioration and high frequency of surveillance required.*  Author: Jonah Blue, MD 09/05/2022 1:34 PM  For on call review [email or web address].

## 2022-09-05 NOTE — Consult Note (Signed)
Palliative Care Consult Note                                  Date: 09/05/2022   Patient Name: Edgar Mooney  DOB: 27-Apr-1965  MRN: [id number]  Age / Sex: 57 y.o., male  PCP: Pcp, No Referring Physician: Jonah Blue, MD  Reason for Consultation: Establishing goals of care  HPI/Patient Profile: 57 y.o. male  with past medical history of prostate cancer with widespread osseous metastasis, polysubstance abuse, and hep C.  He presented to the ED on 09/05/2022 with shoulder pain along with worsening forehead/scalp nodules. CT head showing large amount of vasogenic edema within the right frontal lobe effacing the right lateral ventricle and causing leftward midline shift and early herniation of the cingulate gyrus, concern for subdural blood or dural based tumor (favored), and diffuse sclerotic metastatic disease of the calvarium. Neurosurgery has recommended Decadron and seizure prophylaxis, and reported no need for surgical intervention.  Palliative Medicine has been consulted in the setting of widely metastatic prostate cancer and new concern for brain metastasis.  In June 2023, patient was transitioned to comfort care and transferred to inpatient hospice. This was later reversed and patient was able to resume cancer treatment, with targeted therapy and palliative radiation.    Subjective:   I have reviewed medical records including progress notes, labs and imaging, and discussed with admitting physician Dr. Ophelia Charter.   I met with patient at bedside to discuss diagnosis, prognosis, GOC, EOL wishes, disposition, and options.  He is alert and oriented (except for date). He is able to tell me he is in the hospital for the "bumps" on his scalp/forehead. He tells me he has cancer that has "gone everywhere" and that this is "not good".   We discussed that he had been in hospice care last year (June 2023), but subsequently was able to continue cancer  treatment. He expresses being grateful for this additional time with his family, especially being able to play with his 90 year-old grandchild.   Values and goals of care were attempted to be elicited. Patient states he wants to be "comfortable", however he is not able to verbalize what this means. Given the history, I did not proceed to have a detailed discussion about this without his family present.   We did discuss code status. Encouraged patient to consider DNR/DNI status understanding evidenced based poor outcomes in similar hospitalized patients, as the cause of the arrest is likely associated with chronic/terminal disease rather than a reversible acute cardio-pulmonary event. I explained that DNR/DNI only comes into effect after a person has arrested (died) and is a protective measure to keep [place] from harming the patient in their last moments of life. Patient is clear he would not want to go through resuscitation efforts in the event f cardiac arrest.   He asks that I speak with his children regarding diagnosis/prognosis. He reports having a son and a daughter, but we do not have the daughter's name or number on file and the son's number is incorrect. Fortunately, his daughter calls the room while I am there, so I was able to get her number and call her back from my office.   Patient reports he lives with Junious Dresser, who is his partner and the mother of his 2 children (son/Edgar Mooney and daughter/Edgar Mooney). He confirms that he and Junious Dresser are not married.  _______________________________________________________________  I spoke with patient's daughter/Edgar Mooney by  phone. I introduced myself and the role of Palliative Medicine.   [name] is not able to verbalize any specific information about her father's medical/oncology history or treatment course.   We reviewed patient's medical history and current clinical status. I explained that patient's cancer is widely metastatic to the bones and now to the brain. I  provided education on the natural disease trajectory of metastatic cancer, emphasizing that it is ultimately a noncurable illness and that the intent of treatment is palliative.   We discussed full scope care versus comfort care. [name] expresses significant frustration that her father was sent to a hospice facility last year. She states he was "written off" and did not understand what he was agreeing to when he said he wanted to be comfortable. Emotional support provided.   I informed [name] of my conversation with her father about code status, and that he was clear about his wishes not to be resuscitated in the event of cardiac arrest. [name] states her preference for him to have all available and offered interventions to prolong life, but seems to indicate she would ultimately support his decision. However, she does not want a DNR order placed without further discussion with patient in the presence of her and her brother.    Review of Systems  Musculoskeletal:  Positive for back pain.       Shoulder pain    Objective:   Primary Diagnoses: Present on Admission:  Prostate cancer metastatic to bone (HCC)  Polysubstance abuse (HCC)  Tobacco dependence   Physical Exam Vitals reviewed.  Constitutional:      General: He is not in acute distress.    Appearance: He is ill-appearing.     Comments: Frail  Pulmonary:     Effort: Pulmonary effort is normal.  Neurological:     Mental Status: He is alert.     Comments: Oriented to person, place, situation     Vital Signs:  BP 129/74 (BP Location: Left Arm)   Pulse 74   Temp 98.6 F (37 C) (Oral)   Resp 16   SpO2 100%   Palliative Assessment/Data: PPS 30%     Assessment & Plan:   SUMMARY OF RECOMMENDATIONS   Full code and full scope for now Will defer further GOC discussion until patient can be seen by oncology - I have reached out to Dr. Truett Perna Patient states wishes for DNR and wanting to be "comfortable", but will need  further discussion in presence of his son and daughter PMT will follow-up tomorrow  Primary Decision Maker: PATIENT, but with involvement of son and daughter (who are his NOK, as he is not married to his significant other)  Symptom Management:  Dailudid 0.5-1 mg every 3 hours as needed for pain  Prognosis:  Will defer to oncology  Discharge Planning:  To Be Determined     Thank you for allowing [place] to participate in the care of ALEC KAROW   Time Total: 90 minutes  Greater than 50%  of this time was spent counseling and coordinating care related to the above assessment and plan.  Signed by: Sherlean Foot, NP Palliative Medicine Team  Team Phone # [phone]  For individual providers, please see AMION

## 2022-09-05 NOTE — ED Provider Notes (Signed)
[facility] AT [facility] Provider Note   CSN: [id number] Arrival date & time: 09/05/22  0143     History  Chief Complaint  Patient presents with   Shoulder Pain    Edgar Mooney is a 57 y.o. male.  Patient presents to the department for evaluation of multiple complaints.  Patient reports that in the last couple of days he has started having pain in the right shoulder and the right knee.  He also has a knot on his forehead that has been progressively enlarging.  Patient currently receiving treatment for metastatic prostate cancer.       Home Medications Prior to Admission medications   Medication Sig Start Date End Date Taking? Authorizing Provider  allopurinol (ZYLOPRIM) 100 MG tablet TAKE 1 TABLET BY MOUTH EVERY DAY Patient not taking: Reported on 06/21/2022 12/29/21   Rana Snare, NP  amLODipine (NORVASC) 5 MG tablet Take 1 tablet (5 mg total) by mouth daily. Patient not taking: Reported on 06/21/2022 08/02/21   Wendall Stade, MD  aspirin 81 MG chewable tablet Chew 1 tablet (81 mg total) by mouth daily. Patient not taking: Reported on 06/21/2022 12/29/21   Rana Snare, NP  dexamethasone (DECADRON) 4 MG tablet Take 1 tablet (4 mg total) by mouth daily. Patient not taking: Reported on 06/21/2022 03/12/22   Rana Snare, NP  docusate sodium (COLACE) 100 MG capsule Take 1 capsule (100 mg total) by mouth 2 (two) times daily. Patient not taking: Reported on 06/21/2022 02/06/22   Ladene Artist, MD  fluconazole (DIFLUCAN) 100 MG tablet Take 1 tablet (100 mg total) by mouth daily. Patient not taking: Reported on 06/21/2022 02/06/22   Ladene Artist, MD  nitroGLYCERIN (NITROSTAT) 0.4 MG SL tablet Place 1 tablet (0.4 mg total) under the tongue every 5 (five) minutes as needed for chest pain. Patient not taking: Reported on 11/24/2021 12/23/20   Marinda Elk, MD  ondansetron (ZOFRAN) 8 MG tablet Take 1 tablet (8 mg total) by mouth every 8 (eight)  hours as needed for nausea or vomiting. Patient not taking: Reported on 06/21/2022 12/08/21   Rana Snare, NP  oxyCODONE (OXY IR/ROXICODONE) 5 MG immediate release tablet Take 1-2 tablets (5-10 mg total) by mouth every 8 (eight) hours as needed for severe pain. 08/27/22   Rana Snare, NP  polyethylene glycol powder (MIRALAX) 17 GM/SCOOP powder Take 17 grams mixed in liquid by mouth daily. Patient not taking: Reported on 06/21/2022 02/06/22   Ladene Artist, MD  potassium chloride (KLOR-CON M) 10 MEQ tablet Take 1 tablet (10 mEq total) by mouth daily. Patient not taking: Reported on 06/21/2022 03/19/22   Rana Snare, NP  prochlorperazine (COMPAZINE) 10 MG tablet Take 1 tablet (10 mg total) by mouth every 6 (six) hours as needed for nausea. Patient not taking: Reported on 02/06/2022 10/23/21   Ladene Artist, MD  rosuvastatin (CRESTOR) 20 MG tablet Take 1 tablet (20 mg total) by mouth daily. Patient not taking: Reported on 06/21/2022 08/02/21   Wendall Stade, MD  tamsulosin (FLOMAX) 0.4 MG CAPS capsule Take 1 capsule (0.4 mg total) by mouth daily. Patient not taking: Reported on 06/21/2022 11/30/21   Ladene Artist, MD      Allergies    Patient has no known allergies.    Review of Systems   Review of Systems  Physical Exam Updated Vital Signs BP 123/75   Pulse 85   Temp 98.3  F (36.8 C)   Resp 20   SpO2 100%  Physical Exam Vitals and nursing note reviewed.  Constitutional:      General: He is not in acute distress.    Appearance: He is well-developed.  HENT:     Head: Normocephalic and atraumatic. Mass (upper central forehead) present.     Mouth/Throat:     Mouth: Mucous membranes are moist.  Eyes:     General: Vision grossly intact. Gaze aligned appropriately.     Extraocular Movements: Extraocular movements intact.     Conjunctiva/sclera: Conjunctivae normal.  Cardiovascular:     Rate and Rhythm: Normal rate and regular rhythm.     Pulses: Normal pulses.     Heart  sounds: Normal heart sounds, S1 normal and S2 normal. No murmur heard.    No friction rub. No gallop.  Pulmonary:     Effort: Pulmonary effort is normal. No respiratory distress.     Breath sounds: Normal breath sounds.  Abdominal:     Palpations: Abdomen is soft.     Tenderness: There is no abdominal tenderness. There is no guarding or rebound.     Hernia: No hernia is present.  Musculoskeletal:        General: No swelling.     Right shoulder: Tenderness present. No swelling or deformity.     Cervical back: Full passive range of motion without pain, normal range of motion and neck supple. No pain with movement, spinous process tenderness or muscular tenderness. Normal range of motion.     Right knee: Bony tenderness present. No swelling, deformity, effusion or erythema.     Right lower leg: No edema.     Left lower leg: No edema.  Skin:    General: Skin is warm and dry.     Capillary Refill: Capillary refill takes less than 2 seconds.     Findings: No ecchymosis, erythema, lesion or wound.  Neurological:     Mental Status: He is alert and oriented to person, place, and time.     GCS: GCS eye subscore is 4. GCS verbal subscore is 5. GCS motor subscore is 6.     Cranial Nerves: Cranial nerves 2-12 are intact.     Sensory: Sensation is intact.     Motor: Motor function is intact. No weakness or abnormal muscle tone.     Coordination: Coordination is intact.  Psychiatric:        Mood and Affect: Mood normal.        Speech: Speech normal.        Behavior: Behavior normal.     ED Results / Procedures / Treatments   Labs (all labs ordered are listed, but only abnormal results are displayed) Labs Reviewed  CBC WITH DIFFERENTIAL/PLATELET - Abnormal; Notable for the following components:      Result Value   RBC 2.61 (*)    Hemoglobin 7.9 (*)    HCT 23.7 (*)    Platelets 100 (*)    Lymphs Abs 0.2 (*)    All other components within normal limits  COMPREHENSIVE METABOLIC PANEL -  Abnormal; Notable for the following components:   Sodium 132 (*)    CO2 19 (*)    Glucose, Bld 145 (*)    BUN 22 (*)    Calcium 8.6 (*)    Albumin 3.0 (*)    Alkaline Phosphatase 139 (*)    All other components within normal limits    EKG None  Radiology CT HEAD WO  CONTRAST ( )  Result Date: 09/05/2022 CLINICAL DATA:  Metastatic disease evaluation EXAM: CT HEAD WITHOUT CONTRAST TECHNIQUE: Contiguous axial images were obtained from the base of the skull through the vertex without intravenous contrast. RADIATION DOSE REDUCTION: This exam was performed according to the departmental dose-optimization program which includes automated exposure control, adjustment of the mA and/or kV according to patient size and/or use of iterative reconstruction technique. COMPARISON:  None Available. FINDINGS: Brain: There is a large amount of vasogenic edema within the right frontal lobe effacing the right lateral ventricle and causing leftward midline shift of 10 mm. There are lobulated areas of hyperdensity along the right convexity that could represent subdural blood or dural based tumor. No hydrocephalus. Vascular: No hyperdense vessel or unexpected calcification. Skull: Diffuse sclerotic metastatic disease of the calvarium. There is marked extracranial soft tissue thickening of the high right parietal calvarium. Sinuses/Orbits: No acute finding. Other: None. IMPRESSION: 1. Large amount of vasogenic edema within the right frontal lobe effacing the right lateral ventricle and causing leftward midline shift of 10 mm and early subfalcine herniation of the cingulate gyrus. 2. Lobulated areas of hyperdensity along the right convexity that could represent subdural blood or dural based tumor (favored). 3. Diffuse sclerotic metastatic disease of the calvarium with marked extracranial soft tissue thickening of the high right parietal calvarium. Critical Value/emergent results were called by telephone at the time of  interpretation on 09/05/2022 at 3:06 am to provider [facility] , who verbally acknowledged these results. Electronically Signed   By: Deatra Robinson M.D.   On: 09/05/2022 03:06   DG Knee Complete 4 Views Right  Result Date: 09/05/2022 CLINICAL DATA:  Right knee pain, metastatic prostate cancer EXAM: RIGHT KNEE - COMPLETE 4+ VIEW COMPARISON:  None Available. FINDINGS: Patchy sclerosis is seen throughout the distal femur and proximal tibia and fibula most in keeping with changes of osteoblastic metastatic disease. Normal alignment. No pathologic fracture. Mild medial compartment degenerative arthritis. No effusion. Soft tissues are unremarkable. IMPRESSION: 1. Osteoblastic metastatic disease. No pathologic fracture. 2. Mild medial compartment degenerative arthritis. Electronically Signed   By: Helyn Numbers M.D.   On: 09/05/2022 03:00   DG Shoulder Right  Result Date: 09/05/2022 CLINICAL DATA:  Right shoulder pain EXAM: RIGHT SHOULDER - 2+ VIEW COMPARISON:  None Available. FINDINGS: The osseous structures of the right shoulder as well as the right thoracic cage are are diffusely sclerotic in keeping with widespread osseous metastatic disease in this patient with known underlying metastatic prostate cancer. No superimposed pathologic fracture. Acromioclavicular and glenohumeral joint spaces appear preserved. IMPRESSION: 1. Widespread osseous metastatic disease. No superimposed pathologic fracture. Electronically Signed   By: Helyn Numbers M.D.   On: 09/05/2022 02:58    Procedures Procedures    Medications Ordered in ED Medications  levETIRAcetam (KEPPRA) tablet 500 mg (has no administration in time range)  dexamethasone (DECADRON) tablet 4 mg (has no administration in time range)  HYDROmorphone (DILAUDID) injection 1 mg (has no administration in time range)  ondansetron (ZOFRAN) injection 4 mg (has no administration in time range)    ED Course/ Medical Decision Making/ A&P                              Medical Decision Making Amount and/or Complexity of Data Reviewed External Data Reviewed: radiology and notes. Labs: ordered. Decision-making details documented in ED Course. Radiology: ordered and independent interpretation performed. Decision-making details documented in ED Course.  Risk  Prescription drug management. Decision regarding hospitalization. Decision not to resuscitate or to de-escalate care because of poor prognosis.   Patient presents to the emergency department for evaluation of multiple areas of pain.  Patient has a history of metastatic prostate cancer.  He has been experiencing increased pain which does not respond to his oxycodone.  Patient complaining of pain in the right knee and right shoulder currently, new areas of pain for him.  X-rays show diffuse metastatic disease.  Patient also complaining of a knot on his forehead.  CT head shows that this is metastatic disease in the skull but also significant tumor infiltrating the brain.  Discussed with Dr. Danielle Dess, on-call for neurosurgery.  No neurosurgical intervention at this time, Decadron, seizure prophylaxis.  Findings discussed with the patient.  I discussed with him that there is no cure for his disease.  At this point he can be admitted for pain control and comfort measures.  He does indicate that he wishes to be admitted for pain control.  CRITICAL CARE Performed by: Gilda Crease   Total critical care time: 30 minutes  Critical care time was exclusive of separately billable procedures and treating other patients.  Critical care was necessary to treat or prevent imminent or life-threatening deterioration.  Critical care was time spent personally by me on the following activities: development of treatment plan with patient and/or surrogate as well as nursing, discussions with consultants, evaluation of patient's response to treatment, examination of patient, obtaining history from patient or  surrogate, ordering and performing treatments and interventions, ordering and review of laboratory studies, ordering and review of radiographic studies, pulse oximetry and re-evaluation of patient's condition.         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

## 2022-09-05 NOTE — Plan of Care (Signed)
°  Problem: Clinical Measurements: °Goal: Diagnostic test results will improve °Outcome: Not Progressing °  °Problem: Safety: °Goal: Ability to remain free from injury will improve °Outcome: Not Progressing °  °Problem: Pain Managment: °Goal: General experience of comfort will improve °Outcome: Not Progressing °  °

## 2022-09-05 NOTE — ED Notes (Signed)
ED TO INPATIENT HANDOFF REPORT  ED Nurse Name and Phone #: Ines Bloomer [phone]  S Name/Age/Gender Edgar Mooney 57 y.o. male Room/Bed: 020C/020C  Code Status   Code Status: Prior  Home/SNF/Other Home Patient oriented to: self, place, time, and situation Is this baseline? Yes   Triage Complete: Triage complete  Chief Complaint Brain mass [G93.89]  Triage Note Patient reports right shoulder pain this week , denies injury , pain increases with movement .    Allergies No Known Allergies  Level of Care/Admitting Diagnosis ED Disposition     ED Disposition  Admit   Condition  --   Comment  Hospital Area: [facility] [[id number]]  Level of Care: Progressive [102]  Admit to Progressive based on following criteria: NEUROLOGICAL AND NEUROSURGICAL complex patients with significant risk of instability, who do not meet ICU criteria, yet require close observation or frequent assessment (< / = every 2 - 4 hours) with medical / nursing intervention.  May admit patient to [facility] or [facility] if equivalent level of care is available:: No  Covid Evaluation: Asymptomatic - no recent exposure (last 10 days) testing not required  Diagnosis: Brain mass [327746]  Admitting Physician: Briscoe Deutscher [[id number]]  Attending Physician: Briscoe Deutscher [[id number]]  Certification:: I certify this patient will need inpatient services for at least 2 midnights  Estimated Length of Stay: 3          B Medical/Surgery History Past Medical History:  Diagnosis Date   Alcohol abuse    Cocaine abuse (HCC)    Depression    Gout    Hep C w/o coma, chronic (HCC) diagnosed May 2016   History of radiation therapy    left hip 06/08/2021-06/20/2021  Dr Antony Blackbird   History of radiation therapy    Right Hip(Pelvis) 02/01/22-02/14/22-Dr. Antony Blackbird   Monoallelic mutation of ATM gene 95/62/1308   Prostate cancer metastatic to bone [facility])    Past Surgical History:  Procedure Laterality  Date   CORONARY STENT INTERVENTION N/A 12/21/2020   Procedure: CORONARY STENT INTERVENTION;  Surgeon: Lennette Bihari, MD;  Location: [facility] INVASIVE CV LAB;  Service: Cardiovascular;  Laterality: N/A;   CORONARY STENT INTERVENTION N/A 12/22/2020   Procedure: CORONARY STENT INTERVENTION;  Surgeon: Kathleene Hazel, MD;  Location: [facility] INVASIVE CV LAB;  Service: Cardiovascular;  Laterality: N/A;   IR 3D INDEPENDENT WKST  02/15/2021   IR ANGIOGRAM SELECTIVE EACH ADDITIONAL VESSEL  02/15/2021   IR ANGIOGRAM SELECTIVE EACH ADDITIONAL VESSEL  02/15/2021   IR ANGIOGRAM SELECTIVE EACH ADDITIONAL VESSEL  06/26/2021   IR ANGIOGRAM SELECTIVE EACH ADDITIONAL VESSEL  06/28/2021   IR ANGIOGRAM VISCERAL SELECTIVE  02/15/2021   IR ANGIOGRAM VISCERAL SELECTIVE  02/15/2021   IR ANGIOGRAM VISCERAL SELECTIVE  06/26/2021   IR EMBO TUMOR ORGAN ISCHEMIA INFARCT INC GUIDE ROADMAPPING  06/26/2021   IR RADIOLOGIST EVAL & MGMT  01/11/2021   IR RADIOLOGIST EVAL & MGMT  07/27/2021   IR US GUIDE VASC ACCESS RIGHT  02/15/2021   IR US GUIDE VASC ACCESS RIGHT  06/26/2021   LEFT HEART CATH AND CORONARY ANGIOGRAPHY N/A 12/21/2020   Procedure: LEFT HEART CATH AND CORONARY ANGIOGRAPHY;  Surgeon: Lennette Bihari, MD;  Location: [facility] CV [facility];  Service: Cardiovascular;  Laterality: N/A;   LEFT HEART CATH AND CORONARY ANGIOGRAPHY N/A 08/08/2021   Procedure: LEFT HEART CATH AND CORONARY ANGIOGRAPHY;  Surgeon: Kathleene Hazel, MD;  Location: [facility] INVASIVE CV LAB;  Service: Cardiovascular;  Laterality: N/A;     A IV Location/Drains/Wounds Patient Lines/Drains/Airways Status     Active Line/Drains/Airways     Name Placement date Placement time Site Days   Peripheral IV 09/05/22 20 G Anterior;Distal;Right;Upper Arm 09/05/22  0345  Arm  less than 1            Intake/Output Last 24 hours No intake or output data in the 24 hours ending 09/05/22 0510  Labs/Imaging Results for orders placed or performed during the hospital  encounter of 09/05/22 (from the past 48 hour(s))  CBC with Differential/Platelet     Status: Abnormal   Collection Time: 09/05/22  3:46 AM  Result Value Ref Range   WBC 4.8 4.0 - 10.5 K/uL   RBC 2.61 (L) 4.22 - 5.81 MIL/uL   Hemoglobin 7.9 (L) 13.0 - 17.0 g/dL   HCT 08.6 (L) 57.8 - 46.9 %   MCV 90.8 80.0 - 100.0 fL   MCH 30.3 26.0 - 34.0 pg   MCHC 33.3 30.0 - 36.0 g/dL   RDW 62.9 52.8 - 41.3 %   Platelets 100 (L) 150 - 400 K/uL   nRBC 0.0 0.0 - 0.2 %   Neutrophils Relative % 86 %   Neutro Abs 4.1 1.7 - 7.7 K/uL   Lymphocytes Relative 4 %   Lymphs Abs 0.2 (L) 0.7 - 4.0 K/uL   Monocytes Relative 9 %   Monocytes Absolute 0.5 0.1 - 1.0 K/uL   Eosinophils Relative 0 %   Eosinophils Absolute 0.0 0.0 - 0.5 K/uL   Basophils Relative 0 %   Basophils Absolute 0.0 0.0 - 0.1 K/uL   Immature Granulocytes 1 %   Abs Immature Granulocytes 0.04 0.00 - 0.07 K/uL    Comment: Performed at [facility], [address], [place], [place] [address]  Comprehensive metabolic panel     Status: Abnormal   Collection Time: 09/05/22  3:46 AM  Result Value Ref Range   Sodium 132 (L) 135 - 145 mmol/L   Potassium 3.5 3.5 - 5.1 mmol/L   Chloride 102 98 - 111 mmol/L   CO2 19 (L) 22 - 32 mmol/L   Glucose, Bld 145 (H) 70 - 99 mg/dL    Comment: Glucose reference range applies only to samples taken after fasting for at least 8 hours.   BUN 22 (H) 6 - 20 mg/dL   Creatinine, Ser 0.27 0.61 - 1.24 mg/dL   Calcium 8.6 (L) 8.9 - 10.3 mg/dL   Total Protein 6.6 6.5 - 8.1 g/dL   Albumin 3.0 (L) 3.5 - 5.0 g/dL   AST 29 15 - 41 U/L   ALT 10 0 - 44 U/L   Alkaline Phosphatase 139 (H) 38 - 126 U/L   Total Bilirubin 1.1 0.3 - 1.2 mg/dL   GFR, Estimated >25 >36 mL/min    Comment: (NOTE) Calculated using the CKD-EPI Creatinine Equation (2021)    Anion gap 11 5 - 15    Comment: Performed at [facility], [address] [place], [place] [address]   CT HEAD WO CONTRAST ( )  Result Date: 09/05/2022 CLINICAL  DATA:  Metastatic disease evaluation EXAM: CT HEAD WITHOUT CONTRAST TECHNIQUE: Contiguous axial images were obtained from the base of the skull through the vertex without intravenous contrast. RADIATION DOSE REDUCTION: This exam was performed according to the departmental dose-optimization program which includes automated exposure control, adjustment of the mA and/or kV according to patient size and/or use of iterative reconstruction technique. COMPARISON:  None  Available. FINDINGS: Brain: There is a large amount of vasogenic edema within the right frontal lobe effacing the right lateral ventricle and causing leftward midline shift of 10 mm. There are lobulated areas of hyperdensity along the right convexity that could represent subdural blood or dural based tumor. No hydrocephalus. Vascular: No hyperdense vessel or unexpected calcification. Skull: Diffuse sclerotic metastatic disease of the calvarium. There is marked extracranial soft tissue thickening of the high right parietal calvarium. Sinuses/Orbits: No acute finding. Other: None. IMPRESSION: 1. Large amount of vasogenic edema within the right frontal lobe effacing the right lateral ventricle and causing leftward midline shift of 10 mm and early subfalcine herniation of the cingulate gyrus. 2. Lobulated areas of hyperdensity along the right convexity that could represent subdural blood or dural based tumor (favored). 3. Diffuse sclerotic metastatic disease of the calvarium with marked extracranial soft tissue thickening of the high right parietal calvarium. Critical Value/emergent results were called by telephone at the time of interpretation on 09/05/2022 at 3:06 am to provider [facility] , who verbally acknowledged these results. Electronically Signed   By: Deatra Robinson M.D.   On: 09/05/2022 03:06   DG Knee Complete 4 Views Right  Result Date: 09/05/2022 CLINICAL DATA:  Right knee pain, metastatic prostate cancer EXAM: RIGHT KNEE - COMPLETE 4+  VIEW COMPARISON:  None Available. FINDINGS: Patchy sclerosis is seen throughout the distal femur and proximal tibia and fibula most in keeping with changes of osteoblastic metastatic disease. Normal alignment. No pathologic fracture. Mild medial compartment degenerative arthritis. No effusion. Soft tissues are unremarkable. IMPRESSION: 1. Osteoblastic metastatic disease. No pathologic fracture. 2. Mild medial compartment degenerative arthritis. Electronically Signed   By: Helyn Numbers M.D.   On: 09/05/2022 03:00   DG Shoulder Right  Result Date: 09/05/2022 CLINICAL DATA:  Right shoulder pain EXAM: RIGHT SHOULDER - 2+ VIEW COMPARISON:  None Available. FINDINGS: The osseous structures of the right shoulder as well as the right thoracic cage are are diffusely sclerotic in keeping with widespread osseous metastatic disease in this patient with known underlying metastatic prostate cancer. No superimposed pathologic fracture. Acromioclavicular and glenohumeral joint spaces appear preserved. IMPRESSION: 1. Widespread osseous metastatic disease. No superimposed pathologic fracture. Electronically Signed   By: Helyn Numbers M.D.   On: 09/05/2022 02:58    Pending Labs Unresulted Labs (From admission, onward)    None       Vitals/Pain Today's Vitals   09/05/22 0149 09/05/22 0346 09/05/22 0400 09/05/22 0415  BP: (!) 115/95 123/75 136/64 136/74  Pulse: 92 85 80 86  Resp: 17 20 (!) 22 18  Temp: 98.3 F (36.8 C)     SpO2: 97% 100% 100% 100%  PainSc:  7       Isolation Precautions No active isolations  Medications Medications  levETIRAcetam (KEPPRA) tablet 500 mg (500 mg Oral Given 09/05/22 0428)  dexamethasone (DECADRON) tablet 4 mg (has no administration in time range)  HYDROmorphone (DILAUDID) injection 0.5-1 mg (has no administration in time range)  prochlorperazine (COMPAZINE) injection 5 mg (has no administration in time range)  HYDROmorphone (DILAUDID) injection 1 mg (1 mg Intravenous  Given 09/05/22 0429)  ondansetron (ZOFRAN) injection 4 mg (4 mg Intravenous Given 09/05/22 0428)    Mobility walks     Focused Assessments See chart   R Recommendations: See Admitting Provider Note  Report given to:   Additional Notes: see chart

## 2022-09-05 NOTE — Progress Notes (Signed)
  Transition of Care [facility]) Screening Note   Patient Details  Name: Edgar Mooney Date of Birth: February 01, 1966   Transition of Care [facility]) CM/SW Contact:    Kermit Balo, RN Phone Number: 09/05/2022, 2:47 PM   Pt is from home. Admitted with brain mass. Awaiting palliative consult. Transition of Care Department [facility]) has reviewed patient. We will continue to monitor patient advancement through interdisciplinary progression rounds. If new patient transition needs arise, please place a TOC consult.

## 2022-09-05 NOTE — ED Triage Notes (Signed)
Patient reports right shoulder pain this week , denies injury , pain increases with movement .

## 2022-09-05 NOTE — ED Notes (Signed)
Dr. Pollina at bedside   

## 2022-09-05 NOTE — ED Notes (Signed)
Pt given food and drink.

## 2022-09-05 NOTE — Plan of Care (Signed)

## 2022-09-06 ENCOUNTER — Inpatient Hospital Stay (HOSPITAL_COMMUNITY): Payer: BC Managed Care – PPO

## 2022-09-06 DIAGNOSIS — F1910 Other psychoactive substance abuse, uncomplicated: Secondary | ICD-10-CM

## 2022-09-06 DIAGNOSIS — C7951 Secondary malignant neoplasm of bone: Secondary | ICD-10-CM

## 2022-09-06 DIAGNOSIS — C61 Malignant neoplasm of prostate: Secondary | ICD-10-CM

## 2022-09-06 DIAGNOSIS — G9389 Other specified disorders of brain: Secondary | ICD-10-CM | POA: Diagnosis not present

## 2022-09-06 DIAGNOSIS — F17200 Nicotine dependence, unspecified, uncomplicated: Secondary | ICD-10-CM

## 2022-09-06 DIAGNOSIS — G893 Neoplasm related pain (acute) (chronic): Secondary | ICD-10-CM | POA: Diagnosis not present

## 2022-09-06 DIAGNOSIS — Z515 Encounter for palliative care: Secondary | ICD-10-CM | POA: Diagnosis not present

## 2022-09-06 LAB — CBC
HCT: 21 % — ABNORMAL LOW (ref 39.0–52.0)
Hemoglobin: 7.1 g/dL — ABNORMAL LOW (ref 13.0–17.0)
MCH: 30 pg (ref 26.0–34.0)
MCHC: 33.8 g/dL (ref 30.0–36.0)
MCV: 88.6 fL (ref 80.0–100.0)
Platelets: 95 10*3/uL — ABNORMAL LOW (ref 150–400)
RBC: 2.37 MIL/uL — ABNORMAL LOW (ref 4.22–5.81)
RDW: 14.1 % (ref 11.5–15.5)
WBC: 4 10*3/uL (ref 4.0–10.5)
nRBC: 0 % (ref 0.0–0.2)

## 2022-09-06 LAB — BASIC METABOLIC PANEL
Anion gap: 9 (ref 5–15)
BUN: 24 mg/dL — ABNORMAL HIGH (ref 6–20)
CO2: 20 mmol/L — ABNORMAL LOW (ref 22–32)
Calcium: 8.4 mg/dL — ABNORMAL LOW (ref 8.9–10.3)
Chloride: 105 mmol/L (ref 98–111)
Creatinine, Ser: 0.8 mg/dL (ref 0.61–1.24)
GFR, Estimated: >60 mL/min (ref >60)
Glucose, Bld: 135 mg/dL — ABNORMAL HIGH (ref 70–99)
Potassium: 4.1 mmol/L (ref 3.5–5.1)
Sodium: 134 mmol/L — ABNORMAL LOW (ref 135–145)

## 2022-09-06 MED ORDER — OXYCODONE HCL 5 MG PO TABS
5.0000 mg | ORAL_TABLET | ORAL | Status: DC | PRN
  Administered 2022-09-06 – 2022-09-10 (×10): 10 mg via ORAL
  Administered 2022-09-11: 5 mg via ORAL
  Filled 2022-09-06 (×11): qty 2
  Filled 2022-09-06: qty 1

## 2022-09-06 MED ORDER — POLYETHYLENE GLYCOL 3350 17 G PO PACK
17.0000 g | PACK | Freq: Two times a day (BID) | ORAL | Status: DC | PRN
  Administered 2022-09-09: 17 g via ORAL
  Filled 2022-09-06: qty 1

## 2022-09-06 MED ORDER — POLYETHYLENE GLYCOL 3350 17 G PO PACK: 17.0000 g | PACK | Freq: Two times a day (BID) | ORAL | Status: AC

## 2022-09-06 MED ORDER — SENNOSIDES-DOCUSATE SODIUM 8.6-50 MG PO TABS
1.0000 | ORAL_TABLET | Freq: Two times a day (BID) | ORAL | Status: DC | PRN
  Administered 2022-09-09: 1 via ORAL
  Filled 2022-09-06: qty 1

## 2022-09-06 MED ORDER — SENNOSIDES-DOCUSATE SODIUM 8.6-50 MG PO TABS
1.0000 | ORAL_TABLET | Freq: Two times a day (BID) | ORAL | Status: AC
  Administered 2022-09-06 (×2): 1 via ORAL
  Filled 2022-09-06 (×2): qty 1

## 2022-09-06 NOTE — Progress Notes (Addendum)
IP PROGRESS NOTE  Subjective:   [name] is well-known to me with a history of metastatic prostate cancer, most recently treated with Pluvicto on 07/18/2022.  He presented to the emergency room yesterday with pain he complains of pain at the right knee.  He has noted multiple scalp nodules. Plain x-rays of the right knee revealed osteoblastic metastases.  A CT of the head revealed vasogenic edema in the right frontal lobe effacing the right lateral ventricle with 10 mm of midline shift.  Lobulated areas at the right convexity likely represent dural based tumor.  Extracranial soft tissue thickening of the high right parietal calvarium  He was placed on Decadron and admitted.  He is taking Dilaudid and oxycodone for pain. Objective: Vital signs in last 24 hours: Blood pressure (!) 142/81, pulse 70, temperature 98.9 F (37.2 C), temperature source Oral, resp. rate 18, SpO2 100 %.  Intake/Output from previous day: 05/22 0701 - 05/23 0700 In: -  Out: 500 [Urine:500]  Physical Exam:  HEENT: No thrush multiple skull-based masses over the scalp including a large mass at the parietal scalp Lungs: Bilaterally, no respiratory distress Cardiac: Regular rate and rhythm Abdomen: No hepatosplenomegaly Extremities: No leg edema Musculoskeletal: Slight enlargement of the medial right knee/tibial plateau compared to the left side   Lab Results: Recent Labs    09/05/22 0346 09/06/22 0441  WBC 4.8 4.0  HGB 7.9* 7.1*  HCT 23.7* 21.0*  PLT 100* 95*    BMET Recent Labs    09/05/22 0346 09/06/22 0441  NA 132* 134*  K 3.5 4.1  CL 102 105  CO2 19* 20*  GLUCOSE 145* 135*  BUN 22* 24*  CREATININE 0.87 0.80  CALCIUM 8.6* 8.4*    No results found for: "CEA1", "CEA", "ZOX096", "CA125"  Studies/Results: CT HEAD WO CONTRAST ( )  Result Date: 09/05/2022 CLINICAL DATA:  Metastatic disease evaluation EXAM: CT HEAD WITHOUT CONTRAST TECHNIQUE: Contiguous axial images were obtained from the  base of the skull through the vertex without intravenous contrast. RADIATION DOSE REDUCTION: This exam was performed according to the departmental dose-optimization program which includes automated exposure control, adjustment of the mA and/or kV according to patient size and/or use of iterative reconstruction technique. COMPARISON:  None Available. FINDINGS: Brain: There is a large amount of vasogenic edema within the right frontal lobe effacing the right lateral ventricle and causing leftward midline shift of 10 mm. There are lobulated areas of hyperdensity along the right convexity that could represent subdural blood or dural based tumor. No hydrocephalus. Vascular: No hyperdense vessel or unexpected calcification. Skull: Diffuse sclerotic metastatic disease of the calvarium. There is marked extracranial soft tissue thickening of the high right parietal calvarium. Sinuses/Orbits: No acute finding. Other: None. IMPRESSION: 1. Large amount of vasogenic edema within the right frontal lobe effacing the right lateral ventricle and causing leftward midline shift of 10 mm and early subfalcine herniation of the cingulate gyrus. 2. Lobulated areas of hyperdensity along the right convexity that could represent subdural blood or dural based tumor (favored). 3. Diffuse sclerotic metastatic disease of the calvarium with marked extracranial soft tissue thickening of the high right parietal calvarium. Critical Value/emergent results were called by telephone at the time of interpretation on 09/05/2022 at 3:06 am to provider [facility] , who verbally acknowledged these results. Electronically Signed   By: Deatra Robinson M.D.   On: 09/05/2022 03:06   DG Knee Complete 4 Views Right  Result Date: 09/05/2022 CLINICAL DATA:  Right knee pain, metastatic  prostate cancer EXAM: RIGHT KNEE - COMPLETE 4+ VIEW COMPARISON:  None Available. FINDINGS: Patchy sclerosis is seen throughout the distal femur and proximal tibia and fibula  most in keeping with changes of osteoblastic metastatic disease. Normal alignment. No pathologic fracture. Mild medial compartment degenerative arthritis. No effusion. Soft tissues are unremarkable. IMPRESSION: 1. Osteoblastic metastatic disease. No pathologic fracture. 2. Mild medial compartment degenerative arthritis. Electronically Signed   By: Helyn Numbers M.D.   On: 09/05/2022 03:00   DG Shoulder Right  Result Date: 09/05/2022 CLINICAL DATA:  Right shoulder pain EXAM: RIGHT SHOULDER - 2+ VIEW COMPARISON:  None Available. FINDINGS: The osseous structures of the right shoulder as well as the right thoracic cage are are diffusely sclerotic in keeping with widespread osseous metastatic disease in this patient with known underlying metastatic prostate cancer. No superimposed pathologic fracture. Acromioclavicular and glenohumeral joint spaces appear preserved. IMPRESSION: 1. Widespread osseous metastatic disease. No superimposed pathologic fracture. Electronically Signed   By: Helyn Numbers M.D.   On: 09/05/2022 02:58    Medications: I have reviewed the patient's current medications.  Assessment/Plan:  Metastatic prostate cancer - Lytic bone lesions with retroperitoneal adenopathy concerning for metastatic prostate cancer -03/03/2020-CTA chest/abdomen/pelvis widespread osseous metastatic disease and lower retroperitoneal adenopathy, pathologic right anterior third rib fracture, probable spinal canal tumor at the level of S1, -MRI cervical, thoracic, and lumbar spine 03/03/2020-diffuse osseous metastatic disease, ventral epidural tumor impinging on right S1 nerve root, extraosseous tumor at the bilateral ilium, asymmetric enhancing material at the right C5-6 canal-right  -03/03/2020-PSA 763 -Degarelix 03/04/2020 -Abiraterone/prednisone 03/14/2020 -Every 10-month Lupron 04/01/2020 -04/01/2020 PSA 36.5 -05/31/2020 PSA 1.2 -06/28/2020 PSA 1.0 -12/14/2020 PSA 6.4 -01/13/2021 PSA 10.6 -04/26/2021 PSA   45.2 -05/16/2021 PSA 70 -Abiraterone/prednisone discontinued 05/16/2021 -05/16/2021 left hip x-ray-multifocal sclerotic metastasis throughout the pelvis, confluent involving the left acetabulum.  No acute or pathologic fracture.  Left hip osteoarthritis. -Cycle 1 docetaxel 05/31/2021 -05/31/2021 PSA 113 -Palliative radiation to the left acetabulum 06/08/2021 - 06/20/2021 -Cycle 2 docetaxel 06/21/2021 -06/21/2021 PSA improved at 68 -Cycle 3 docetaxel 07/12/2021 -07/12/2021 PSA improved at 52 -Cycle 4 docetaxel 08/02/2021 -Cycle 5 docetaxel 08/22/2021 -CT chest 09/16/2021-negative for pulmonary embolism, right liver mass, extensive bone metastases -Cycle 6 docetaxel 09/28/2021 -PSA increased 09/28/2021 -Olaparib 10/24/2021, patient decreased dose to 1 tablet twice daily on 10/31/2021 due to muscle spasms; dose resumed at prescribed amount 11/06/2021 -PSA increased 11/24/2021 -PSA further increased 12/08/2021 -Olaparib discontinued -PET PSMA scan 01/11/2022-widespread intensely radiotracer avid sclerotic skeletal metastasis involving the axillary and appendicular skeleton.  Small-volume periaortic retroperitoneal radiotracer avid prostate cancer nodal metastasis.  Nonspecific activity in the prostate gland.  No liver or lung prostate cancer metastasis. -Palliative radiation to the right pelvis and proximal right femur 02/01/2022 -Pluvicto 03/14/2022 -PSA markedly improved at 160 on 04/18/2022 -Pluvicto 04/26/2022 -Pluvicto 06/06/2022 -Pluvicto 07/18/2022 2.  Severe anemia-likely secondary to metastatic prostate cancer involving the bones 3.  Mild thrombocytopenia 4.  Cirrhosis with splenomegaly 5.  History of hepatitis C 6.  History of polysubstance abuse 7.  Depression 8.  Tobacco dependence 9.  Right arm weakness, right facial numbness-potentially related to nerve root compromise from metastatic bone lesions 10.  Pain secondary to #1 11.  Extensive bone metastases-every 5-month Zometa starting 04/01/2020 12.   Gout-acute flare right wrist 04/01/2020 treated with indomethacin, allopurinol resumed 13.  Right hepatic lobe mass with elevated AFP concerning for [facility] MRI abdomen 12/21/2020-hypoenhancing inferior right liver mass, segment 6, occluding or compressing the adjacent right portal vein branch, intrahepatic cholangiocarcinoma  favored with differential including hepatocellular carcinoma, Li-Rads M Y 90 planning study 11-22 Y90 right lobe of liver 06/26/2021 14.  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  Present[facility] room 09/16/2021 with chest pain-transfer to [facility] on a morphine drip and scheduled Ativan 17.  Admission 09/05/2022 with increased pain, progressive skull mass CT head 09/05/2022-vasogenic edema in the right frontal lobe with midline shift, lobulated areas of dural based tumor the right convexity, diffuse sclerotic metastatic disease of the calvarium, extracranial soft tissue thickening of the right parietal calvarium     [name] has metastatic hormone refractory prostate cancer.  He has been maintained on treatment with Pluvicto since November 2023.  He had initial clinical improvement with Pluvicto and the PSA approved.  The PSA has increased over the past several months.  He has developed progressive skull-based metastases.  He is now admitted with increased pain secondary to prostate cancer involving multiple bone sites.  His pain has generally been well-controlled with oxycodone his pain has generally been well-controlled with oxycodone.   He has significant edema in the right brain, likely secondary to extrinsic brain compression from a skull-based metastasis.  He is mildly confused today, but otherwise appears asymptomatic from the brain edema.  I discussed the x-ray findings and  prognosis with [name].  He understands no therapy will be curative.  He has developed progressive disease despite multiple systemic treatment regimens.  He appears to be a candidate for hospice care.  I recommend home hospice care versus skilled nursing facility placement with hospice.  Decadron may palliate the brain edema and can partially treat the metastatic prostate cancer.  However I suspect his lifespan to be limited 2 weeks or a few months.  We can consider brain MRI and palliative radiation to the skull.  I will discuss this option with [name] and his family.  Pain has responded to previous radiation at the bilateral hip regions  I discussed CODE STATUS with him.  He was unable to make a decision on CODE STATUS today.  He requested I contact his daughter.  I called her and there was no answer and no voicemail option.  I will try again later today.  Recommendations: Continue Decadron, can convert to twice daily dosing in 2 days Continue oxycodone and Dilaudid for pain, can add a long-acting narcotic if he continues to require frequent oxycodone dosing [facility] for home hospice care Consider palliative radiation to the skull I will be glad to serve as the primary provider with the hospice team    LOS: 1 day   Thornton Papas, MD   09/06/2022, 6:40 AM  I had further discussion with [name] at approximately 4:15 PM.  He appears unchanged.  I reviewed the next with Dr. Roselind Messier.  He does not recommend palliative radiation.  [name] agrees to hospice care.  I will make referral to [facility].  I attempted to contact his daughter again and left a message.

## 2022-09-06 NOTE — Progress Notes (Signed)
PROGRESS NOTE  Edgar Mooney NWG:[id number] DOB: 08-28-1965   PCP: Pcp, No  Patient is from: Home.  DOA: 09/05/2022 LOS: 1  Chief complaints Chief Complaint  Patient presents with   Shoulder Pain     Brief Narrative / Interim history: 57 year old M with PMH of metastatic prostate cancer, polysubstance abuse, hep C, tobacco use disorder, HTN and anemia presenting with bilateral shoulder pain, knee pain and increased forehead mass, and admitted for large vasogenic edema within the right frontal lobe with 10 mm midline shift and early subfalcine herniation of the cingulate gyrus likely from metastatic prostate cancer.  Bilateral shoulder and right knee x-ray also showed widespread osseous metastatic disease.  Of note, patient was seen in ED in 09/2021 and discharged to [facility] before he went home and resume treatment for his prostate cancer and palliative radiation.His PSA, which had dropped from 1070 to 18.5 was up to 149 on 4/29.     In ED, Workup demonstrates widespread osseous metastatic disease and large area of vasogenic edema in the right frontal lobe with midline shift and early herniation of cingulate gyrus. Neurosurgery [facility]) was consulted and recommended Decadron and seizure prophylaxis.  Palliative and oncology consulted as well.  Subjective: Seen and examined earlier this morning.  No major events overnight of this morning.  No complaints.  He denies pain.  Objective: Vitals:   09/05/22 1900 09/05/22 2300 09/06/22 0414 09/06/22 0739  BP: 124/73 (!) 143/79 (!) 142/81 (!) 141/86  Pulse: (!) 106 74 70 70  Resp: 17 17 18 19   Temp: 98.2 F (36.8 C) 98.2 F (36.8 C) 98.9 F (37.2 C) 98.6 F (37 C)  TempSrc: Oral Oral Oral Oral  SpO2: 98% 100% 100% 99%    Examination:  GENERAL: Appears frail.  No apparent distress. HEENT: MMM.  Vision and hearing grossly intact.  Quarter sized mass over left frontal scalp. NECK: Supple.  No apparent JVD.  RESP:  No IWOB.  Fair  aeration bilaterally. CVS:  RRR. Heart sounds normal.  ABD/GI/GU: BS+. Abd firm and slightly tender.  No rebound. MSK/EXT: Significant muscle mass and subcu fat loss. SKIN: no apparent skin lesion or wound NEURO: Awake, alert and oriented appropriately.  No apparent focal neuro deficit. PSYCH: Calm. Normal affect.   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 Truett Perna.  Previously admitted to residential hospice in 6/23 but went home and had treatment and palliative radiation.  CT showed a large amount of vasogenic edema with early subfalcine herniation of the cingulate gyrus with concern for dural based tumor as well as diffuse metastatic disease of the calvarium.  Bilateral shoulder and right knee x-ray with evidence of osseous metastasis.  He appears frail. -Neurosurgery, Dr. Danielle Dess recommended Decadron and seizure prophylaxis -Oncology and palliative medicine consulted.  Anemia of chronic disease: H&H slightly trended down.  No obvious bleeding. Recent Labs    04/06/22 0935 04/18/22 1321 05/03/22 1302 05/24/22 0915 06/06/22 1445 06/21/22 0754 07/11/22 1027 08/13/22 1257 09/05/22 0346 09/06/22 0441  HGB 7.8* 8.2* 7.3* 10.0* 9.1* 9.0* 9.2* 8.7* 7.9* 7.1*  -Continue monitoring -Check anemia panel in the morning  Abdominal tenderness: Somewhat firm and tender on exam.  LBM 2 days ago. -Check KUB -Bowel regimen  H/o polysubstance abuse: Routinely positive UDS for cocaine during all previous checks -Need to monitor for withdrawal   Tobacco dependence -Encourage cessation.   -Patch ordered    Thrombocytopenia: Likely from malignancy.  There is no height or weight on file to calculate BMI.          DVT  prophylaxis:  SCDs Start: 09/05/22 0911  Code Status: Full code Family Communication: None at bedside Level of care: Med-Surg Status is: Inpatient Remains inpatient appropriate because: Metastatic prostate cancer with vasogenic edema and midline shift   Final disposition: TBD Consultants:  Neurosurgery Oncology Palliative medicine  55 minutes with more than 50% spent in reviewing records, counseling patient/family and coordinating care.   Sch Meds:  Scheduled Meds:  dexamethasone  4 mg Oral Q6H   docusate sodium  100 mg Oral BID   levETIRAcetam  500 mg Oral BID   polyethylene glycol  17 g Oral BID   senna-docusate  1 tablet Oral BID   sodium chloride flush  3 mL Intravenous Q12H   Continuous Infusions:  lactated ringers 75 mL/hr at 09/06/22 1100   PRN Meds:.acetaminophen **OR** acetaminophen, albuterol, hydrALAZINE, HYDROmorphone (DILAUDID) injection, ondansetron **OR** ondansetron (ZOFRAN) IV, oxyCODONE, polyethylene glycol **FOLLOWED BY** [START ON 09/07/2022] polyethylene glycol, prochlorperazine, senna-docusate **FOLLOWED BY** [START ON 09/07/2022] senna-docusate  Antimicrobials: Anti-infectives (From admission, onward)    None        I have personally reviewed the following labs and images: CBC: Recent Labs  Lab 09/05/22 0346 09/06/22 0441  WBC 4.8 4.0  NEUTROABS 4.1  --   HGB 7.9* 7.1*  HCT 23.7* 21.0*  MCV 90.8 88.6  PLT 100* 95*   BMP &GFR Recent Labs  Lab 09/05/22 0346 09/06/22 0441  NA 132* 134*  K 3.5 4.1  CL 102 105  CO2 19* 20*  GLUCOSE 145* 135*  BUN 22* 24*  CREATININE 0.87 0.80  CALCIUM 8.6* 8.4*   CrCl cannot be calculated (Unknown ideal weight.). Liver & Pancreas: Recent Labs  Lab 09/05/22 0346  AST 29  ALT 10  ALKPHOS 139*  BILITOT 1.1  PROT 6.6  ALBUMIN 3.0*   No results for input(s): "LIPASE", "AMYLASE" in the last 168 hours. No results for input(s): "AMMONIA" in the last 168 hours. Diabetic: No results for  input(s): "HGBA1C" in the last 72 hours. No results for input(s): "GLUCAP" in the last 168 hours. Cardiac Enzymes: No results for input(s): "CKTOTAL", "CKMB", "CKMBINDEX", "TROPONINI" in the last 168 hours. No results for input(s): "PROBNP" in the last 8760 hours. Coagulation Profile: No results for input(s): "INR", "PROTIME" in the last 168 hours. Thyroid Function Tests: No results for input(s): "TSH", "T4TOTAL", "FREET4", "T3FREE", "THYROIDAB" in the last 72 hours. Lipid Profile: No results for input(s): "CHOL", "HDL", "LDLCALC", "TRIG", "CHOLHDL", "LDLDIRECT" in the last 72 hours. Anemia Panel: No results for input(s): "VITAMINB12", "FOLATE", "FERRITIN", "TIBC", "IRON", "RETICCTPCT" in the last 72 hours. Urine analysis:    Component Value Date/Time   COLORURINE STRAW (A) 09/16/2021 0200   APPEARANCEUR CLEAR 09/16/2021 0200   LABSPEC 1.002 (L) 09/16/2021 0200   PHURINE 6.0 09/16/2021 0200   GLUCOSEU NEGATIVE 09/16/2021 0200   HGBUR NEGATIVE 09/16/2021 0200   BILIRUBINUR NEGATIVE 09/16/2021 0200   KETONESUR NEGATIVE 09/16/2021 0200   PROTEINUR NEGATIVE 09/16/2021 0200   NITRITE NEGATIVE 09/16/2021 0200   LEUKOCYTESUR NEGATIVE 09/16/2021 0200   Sepsis Labs: Invalid input(s): "PROCALCITONIN", "LACTICIDVEN"  Microbiology: No results found for this or any previous visit (from the past 240 hour(s)).  Radiology Studies: DG Abd Portable 1V  Result Date: 09/06/2022 CLINICAL DATA:  Abdominal distension. EXAM: PORTABLE ABDOMEN - 1 VIEW COMPARISON:  PET-CT 01/11/2022.  Abdominal CT 12/10/2020. FINDINGS: 0950 hours. The bowel gas pattern is nonobstructive. There is gas throughout the small and large bowel, but no significant bowel distension or wall thickening identified. No supine evidence of free intraperitoneal air. Apparent interval cholecystectomy clips. Widespread blastic metastatic disease is noted throughout the bones. IMPRESSION: No evidence of bowel obstruction or other acute  process. Diffuse osseous metastatic disease. Electronically Signed   By: Carey Bullocks M.D.   On: 09/06/2022 11:14      Debanhi Blaker T. Riah Kehoe [facility]  If 7PM-7AM, please contact night-coverage [email or web address] 09/06/2022, 11:34 AM

## 2022-09-06 NOTE — Progress Notes (Signed)
Palliative Medicine Progress Note   Patient Name: Edgar Mooney       Date: 09/06/2022 DOB: 05-25-1965  Age: 57 y.o. MRN#: [id number] Attending Physician: Almon Hercules, MD Primary Care Physician: Aviva Kluver Admit Date: 09/05/2022    HPI/Patient Profile: 57 y.o. male  with past medical history of prostate cancer with widespread osseous metastasis, polysubstance abuse, and hep C.  He presented to the ED on 09/05/2022 with shoulder pain along with worsening forehead/scalp nodules. CT head showing large amount of vasogenic edema within the right frontal lobe effacing the right lateral ventricle and causing leftward midline shift and early herniation of the cingulate gyrus, concern for subdural blood or dural based tumor (favored), and diffuse sclerotic metastatic disease of the calvarium. Neurosurgery has recommended Decadron and seizure prophylaxis, and reported no need for surgical intervention.   Palliative Medicine has been consulted in the setting of widely metastatic prostate cancer and new concern for brain metastasis.   In June 2023, patient was transitioned to comfort care and transferred to inpatient hospice. This was later reversed and patient was able to resume cancer treatment, with targeted therapy and palliative radiation.     Subjective: Chart reviewed, update received from RN, and patient assessed at bedside. Patient has been receiving IV dilaudid approximately every 3 hours. He is currently reporting pain "all over". RN is in the room to administer a dose of dilaudid.   Patient states he remembers seeing Dr. Truett Perna earlier this morning, but does not recall what he said. Patient states he has "cancer all over" and that the pain medication is making him "not on this planet".   I spoke  with daughter/Edgar Mooney by phone and stated that we will not be meeting until she (or her brother) has been updated by Dr. Truett Perna. Edgar Mooney verbalizes understanding.    Objective:  Physical Exam Vitals reviewed.  Constitutional:      General: He is awake. He is not in acute distress.    Appearance: He is ill-appearing.  HENT:     Head:     Comments: Nodule/mass over frontal scalp Pulmonary:     Effort: Pulmonary effort is normal.  Neurological:     Mental Status: He is confused.             Vital Signs: BP 111/89 (BP Location: Right  Arm)   Pulse 71   Temp 97.6 F (36.4 C) (Oral)   Resp 18   SpO2 95%  SpO2: SpO2: 95 % O2 Device: O2 Device: Room Air O2 Flow Rate:     Palliative Medicine Assessment & Plan   Assessment: Principal Problem:   Brain mass Active Problems:   Polysubstance abuse (HCC)   Prostate cancer metastatic to bone (HCC)   Tobacco dependence    Recommendations/Plan: Full code full scope for now Appreciate Dr. Truett Perna seeing patient this morning - awaiting recommendations PMT will follow-up with family tomorrow   Prognosis:  Will defer to oncology  Discharge Planning: To Be Determined    Thank you for allowing the Palliative Medicine Team to assist in the care of this patient.   MDM - moderate   Merry Proud, NP   Please contact Palliative Medicine Team phone at [phone] for questions and concerns.  For individual providers, please see AMION.

## 2022-09-07 DIAGNOSIS — C61 Malignant neoplasm of prostate: Secondary | ICD-10-CM | POA: Diagnosis not present

## 2022-09-07 DIAGNOSIS — F17200 Nicotine dependence, unspecified, uncomplicated: Secondary | ICD-10-CM | POA: Diagnosis not present

## 2022-09-07 DIAGNOSIS — G9389 Other specified disorders of brain: Secondary | ICD-10-CM | POA: Diagnosis not present

## 2022-09-07 DIAGNOSIS — F1910 Other psychoactive substance abuse, uncomplicated: Secondary | ICD-10-CM | POA: Diagnosis not present

## 2022-09-07 DIAGNOSIS — G893 Neoplasm related pain (acute) (chronic): Secondary | ICD-10-CM | POA: Diagnosis not present

## 2022-09-07 LAB — IRON AND TIBC
Iron: 83 ug/dL (ref 45–182)
Saturation Ratios: 42 % — ABNORMAL HIGH (ref 17.9–39.5)
TIBC: 196 ug/dL — ABNORMAL LOW (ref 250–450)
UIBC: 113 ug/dL

## 2022-09-07 LAB — RENAL FUNCTION PANEL
Albumin: 2.9 g/dL — ABNORMAL LOW (ref 3.5–5.0)
Anion gap: 11 (ref 5–15)
BUN: 38 mg/dL — ABNORMAL HIGH (ref 6–20)
CO2: 17 mmol/L — ABNORMAL LOW (ref 22–32)
Calcium: 8.4 mg/dL — ABNORMAL LOW (ref 8.9–10.3)
Chloride: 108 mmol/L (ref 98–111)
Creatinine, Ser: 0.97 mg/dL (ref 0.61–1.24)
GFR, Estimated: >60 mL/min (ref >60)
Glucose, Bld: 129 mg/dL — ABNORMAL HIGH (ref 70–99)
Phosphorus: 4 mg/dL (ref 2.5–4.6)
Potassium: 3.8 mmol/L (ref 3.5–5.1)
Sodium: 136 mmol/L (ref 135–145)

## 2022-09-07 LAB — CBC
HCT: 22.4 % — ABNORMAL LOW (ref 39.0–52.0)
Hemoglobin: 7.5 g/dL — ABNORMAL LOW (ref 13.0–17.0)
MCH: 30.6 pg (ref 26.0–34.0)
MCHC: 33.5 g/dL (ref 30.0–36.0)
MCV: 91.4 fL (ref 80.0–100.0)
Platelets: 105 10*3/uL — ABNORMAL LOW (ref 150–400)
RBC: 2.45 MIL/uL — ABNORMAL LOW (ref 4.22–5.81)
RDW: 14.1 % (ref 11.5–15.5)
WBC: 4.7 10*3/uL (ref 4.0–10.5)
nRBC: 0 % (ref 0.0–0.2)

## 2022-09-07 LAB — RETICULOCYTES
Immature Retic Fract: 19.4 % — ABNORMAL HIGH (ref 2.3–15.9)
RBC.: 2.44 MIL/uL — ABNORMAL LOW (ref 4.22–5.81)
Retic Count, Absolute: 51.2 10*3/uL (ref 19.0–186.0)
Retic Ct Pct: 2.1 % (ref 0.4–3.1)

## 2022-09-07 LAB — FOLATE: Folate: 8.7 ng/mL (ref >5.9)

## 2022-09-07 LAB — MAGNESIUM: Magnesium: 2.4 mg/dL (ref 1.7–2.4)

## 2022-09-07 LAB — FERRITIN: Ferritin: 3332 ng/mL — ABNORMAL HIGH (ref 24–336)

## 2022-09-07 LAB — VITAMIN B12: Vitamin B-12: 229 pg/mL (ref 180–914)

## 2022-09-07 MED ORDER — NICOTINE 21 MG/24HR TD PT24
21.0000 mg | MEDICATED_PATCH | Freq: Every day | TRANSDERMAL | Status: DC
  Administered 2022-09-07 – 2022-09-11 (×5): 21 mg via TRANSDERMAL
  Filled 2022-09-07 (×5): qty 1

## 2022-09-07 MED ORDER — OXYCODONE HCL ER 10 MG PO T12A
20.0000 mg | EXTENDED_RELEASE_TABLET | Freq: Two times a day (BID) | ORAL | Status: DC
  Administered 2022-09-07 – 2022-09-08 (×3): 20 mg via ORAL
  Filled 2022-09-07 (×3): qty 2

## 2022-09-07 NOTE — Progress Notes (Signed)
IP PROGRESS NOTE  Subjective:   Edgar Mooney appears unchanged.  His son and daughter were at the bedside when I saw him at approximately 6:30 AM.  He has adequate pain relief with the current narcotic regimen.  He reports abdominal "soreness ". Objective: Vital signs in last 24 hours: Blood pressure (!) 143/77, pulse 67, temperature 98.4 F (36.9 C), temperature source Oral, resp. rate 18, SpO2 99 %.  Intake/Output from previous day: 05/23 0701 - 05/24 0700 In: 2136.2 [P.O.:720; I.V.:1416.2] Out: -   Physical Exam:  HEENT: No thrush multiple skull-based masses over the scalp including a large mass at the parietal scalp Abdomen: No hepatosplenomegaly, soft Extremities: No leg edema Neurologic: Alert, not oriented to place.  Oriented to diagnosis.  Knows my name and his family.  Follows commands.  Moves all extremities.   Lab Results: Recent Labs    09/05/22 0346 09/06/22 0441  WBC 4.8 4.0  HGB 7.9* 7.1*  HCT 23.7* 21.0*  PLT 100* 95*    BMET Recent Labs    09/06/22 0441 09/07/22 0704  NA 134* 136  K 4.1 3.8  CL 105 108  CO2 20* 17*  GLUCOSE 135* 129*  BUN 24* 38*  CREATININE 0.80 0.97  CALCIUM 8.4* 8.4*    No results found for: "CEA1", "CEA", "EXB284", "CA125"  Studies/Results: DG Abd Portable 1V  Result Date: 09/06/2022 CLINICAL DATA:  Abdominal distension. EXAM: PORTABLE ABDOMEN - 1 VIEW COMPARISON:  PET-CT 01/11/2022.  Abdominal CT 12/10/2020. FINDINGS: 0950 hours. The bowel gas pattern is nonobstructive. There is gas throughout the small and large bowel, but no significant bowel distension or wall thickening identified. No supine evidence of free intraperitoneal air. Apparent interval cholecystectomy clips. Widespread blastic metastatic disease is noted throughout the bones. IMPRESSION: No evidence of bowel obstruction or other acute process. Diffuse osseous metastatic disease. Electronically Signed   By: Carey Bullocks M.D.   On: 09/06/2022 11:14     Medications: I have reviewed the patient's current medications.  Assessment/Plan:  Metastatic prostate cancer - Lytic bone lesions with retroperitoneal adenopathy concerning for metastatic prostate cancer -03/03/2020-CTA chest/abdomen/pelvis widespread osseous metastatic disease and lower retroperitoneal adenopathy, pathologic right anterior third rib fracture, probable spinal canal tumor at the level of S1, -MRI cervical, thoracic, and lumbar spine 03/03/2020-diffuse osseous metastatic disease, ventral epidural tumor impinging on right S1 nerve root, extraosseous tumor at the bilateral ilium, asymmetric enhancing material at the right C5-6 canal-right  -03/03/2020-PSA 763 -Degarelix 03/04/2020 -Abiraterone/prednisone 03/14/2020 -Every 60-month Lupron 04/01/2020 -04/01/2020 PSA 36.5 -05/31/2020 PSA 1.2 -06/28/2020 PSA 1.0 -12/14/2020 PSA 6.4 -01/13/2021 PSA 10.6 -04/26/2021 PSA  45.2 -05/16/2021 PSA 70 -Abiraterone/prednisone discontinued 05/16/2021 -05/16/2021 left hip x-ray-multifocal sclerotic metastasis throughout the pelvis, confluent involving the left acetabulum.  No acute or pathologic fracture.  Left hip osteoarthritis. -Cycle 1 docetaxel 05/31/2021 -05/31/2021 PSA 113 -Palliative radiation to the left acetabulum 06/08/2021 - 06/20/2021 -Cycle 2 docetaxel 06/21/2021 -06/21/2021 PSA improved at 68 -Cycle 3 docetaxel 07/12/2021 -07/12/2021 PSA improved at 52 -Cycle 4 docetaxel 08/02/2021 -Cycle 5 docetaxel 08/22/2021 -CT chest 09/16/2021-negative for pulmonary embolism, right liver mass, extensive bone metastases -Cycle 6 docetaxel 09/28/2021 -PSA increased 09/28/2021 -Olaparib 10/24/2021, patient decreased dose to 1 tablet twice daily on 10/31/2021 due to muscle spasms; dose resumed at prescribed amount 11/06/2021 -PSA increased 11/24/2021 -PSA further increased 12/08/2021 -Olaparib discontinued -PET PSMA scan 01/11/2022-widespread intensely radiotracer avid sclerotic skeletal metastasis involving  the axillary and appendicular skeleton.  Small-volume periaortic retroperitoneal radiotracer avid prostate cancer nodal metastasis.  Nonspecific  activity in the prostate gland.  No liver or lung prostate cancer metastasis. -Palliative radiation to the right pelvis and proximal right femur 02/01/2022 -Pluvicto 03/14/2022 -PSA markedly improved at 160 on 04/18/2022 -Pluvicto 04/26/2022 -Pluvicto 06/06/2022 -Pluvicto 07/18/2022 2.  Severe anemia-likely secondary to metastatic prostate cancer involving the bones 3.  Mild thrombocytopenia 4.  Cirrhosis with splenomegaly 5.  History of hepatitis C 6.  History of polysubstance abuse 7.  Depression 8.  Tobacco dependence 9.  Right arm weakness, right facial numbness-potentially related to nerve root compromise from metastatic bone lesions 10.  Pain secondary to #1 11.  Extensive bone metastases-every 44-month Zometa starting 04/01/2020 12.  Gout-acute flare right wrist 04/01/2020 treated with indomethacin, allopurinol resumed 13.  Right hepatic lobe mass with elevated AFP concerning for [facility] MRI abdomen 12/21/2020-hypoenhancing inferior right liver mass, segment 6, occluding or compressing the adjacent right portal vein branch, intrahepatic cholangiocarcinoma favored with differential including hepatocellular carcinoma, Li-Rads M Y 90 planning study 11-22 Y90 right lobe of liver 06/26/2021 14.  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  Presentation to the [facility] emergency room 09/16/2021 with chest pain-transfer to [facility] on a morphine drip and scheduled Ativan 17.  Admission 09/05/2022 with increased pain, progressive skull mass CT head 09/05/2022-vasogenic edema in the right frontal lobe with midline shift, lobulated areas of dural based tumor the right convexity, diffuse sclerotic  metastatic disease of the calvarium, extracranial soft tissue thickening of the right parietal calvarium     Edgar Mooney has metastatic hormone refractory prostate cancer.  He has been maintained on treatment with Pluvicto since November 2023.  He had initial clinical improvement with Pluvicto and the PSA approved.  The PSA has increased over the past several months.  He has developed progressive skull-based metastases.  He is now admitted with increased pain secondary to prostate cancer involving multiple bone sites.  His pain has generally been well-controlled with oxycodone his pain has generally been well-controlled with oxycodone.   He has significant edema in the right brain, likely secondary to extrinsic brain compression from a skull-based metastasis.  He is mildly confusion, but otherwise appears asymptomatic from the brain edema.  I discussed the prognosis with Mr. Grigas and his family.  I reviewed the case with radiation oncology yesterday.  We do not recommend palliative radiation to the skull masses.  I recommend continuing Decadron.  The Decadron may palliate the brain edema and pain.  We discussed CODE STATUS again today.  He firm his decision placed on a no CODE BLUE status.  His son and daughter were present for this discussion.  Mr. Turnbaugh and his family indicate he cannot be cared for at home.  He will need placement with hospice.  He does not appear to be a candidate for [facility] at present, though he may be within the next month.  Recommendations: No CODE BLUE Change Decadron to 4 mg p.o. twice daily beginning tomorrow Continue oxycodone and Dilaudid for pain, can add a long-acting narcotic if he continues to require frequent oxycodone dosing [facility] referral Skilled nursing facility placement with hospice Please call oncology as needed, I will check on him 09/10/2022 if he remains in the hospital    LOS: 2 days   Thornton Papas, MD   09/07/2022, 9:26 AM  I  had further discussion with Mr. Juras at approximately 4:15 PM.  He appears unchanged.  I reviewed the next with Dr. Roselind Messier.  He does not recommend palliative radiation.  Mr. Biesinger agrees to hospice care.  I will make referral to [facility].  I attempted to contact his daughter again and left a message.

## 2022-09-07 NOTE — Progress Notes (Signed)
Per patient daughter at bedside, patient wallet located. Patient cell phone also given to patient daughter.

## 2022-09-07 NOTE — Evaluation (Signed)
Occupational Therapy Evaluation and Discharge Patient Details Name: Edgar Mooney MRN: [id number] DOB: Sep 13, 1965 Today's Date: 09/07/2022   History of Present Illness 57 year old M with PMH of metastatic prostate cancer, polysubstance abuse, hep C, tobacco use disorder, HTN and anemia presenting with bilateral shoulder pain, knee pain and increased forehead mass, and admitted for large vasogenic edema within the right frontal lobe with 10 mm midline shift and early subfalcine herniation of the cingulate gyrus likely from metastatic prostate cancer.  Bilateral shoulder and right knee x-ray also showed widespread osseous metastatic disease.   Clinical Impression   This 57 yo male admitted with above presents to acute OT with PLOF per pt of being ambulatory on his own and doing his own ADLs up until 2 weeks ago. He currently is setup/S-total A for ADLs and min A for mobility (bed and ambulation with RW). Pt was motivated to get up and walk with me in room (and even wanted to walk in hallway but had to make a detour to bathroom and then was in too much pain (low back and between shoulder blades) to continue. Per the chart it appears pt will be D/C'ing to SNF under hospice care and thus will not be able to get therapy services there. Would recommend as this time that pt get up and ambulate with staff on unit at least once a day to keep his strength up for as long as possible.      Recommendations for follow up therapy are one component of a multi-disciplinary discharge planning process, led by the attending physician.  Recommendations may be updated based on patient status, additional functional criteria and insurance authorization.   Assistance Recommended at Discharge Frequent or constant Supervision/Assistance  Patient can return home with the following A little help with walking and/or transfers;A lot of help with bathing/dressing/bathroom;Assistance with cooking/housework;Help with stairs or ramp for  entrance;Assist for transportation;Direct supervision/assist for financial management;Direct supervision/assist for medications management    Functional Status Assessment  Patient has had a recent decline in their functional status and/or demonstrates limited ability to make significant improvements in function in a reasonable and predictable amount of time (due to patient to go to SNF under hospice due to no viable treatment options for his condition.)  Equipment Recommendations  None recommended by OT       Precautions / Restrictions Precautions Precautions: Fall Precaution Comments: incontient of stool during OT session Restrictions Weight Bearing Restrictions: No      Mobility Bed Mobility Overal bed mobility: Needs Assistance Bed Mobility: Rolling, Sidelying to Sit, Sit to Sidelying Rolling: Supervision Sidelying to sit: Min assist, HOB elevated     Sit to sidelying: Min guard General bed mobility comments: VCs for sequencing for in and OOB, A for trunk for up to EOB    Transfers Overall transfer level: Needs assistance Equipment used: Rolling walker (2 wheels) Transfers: Sit to/from Stand Sit to Stand: Min assist                  Balance Overall balance assessment: Needs assistance Sitting-balance support: No upper extremity supported, Feet supported Sitting balance-Leahy Scale: Good     Standing balance support: Bilateral upper extremity supported, Reliant on assistive device for balance Standing balance-Leahy Scale: Poor                             ADL either performed or assessed with clinical judgement   ADL Overall ADL's :  Needs assistance/impaired Eating/Feeding: Independent;Sitting   Grooming: Set up;Supervision/safety;Sitting   Upper Body Bathing: Set up;Supervision/ safety;Sitting   Lower Body Bathing: Moderate assistance Lower Body Bathing Details (indicate cue type and reason): min A sit<>stand Upper Body Dressing : Minimal  assistance;Sitting   Lower Body Dressing: Moderate assistance Lower Body Dressing Details (indicate cue type and reason): min A sit<>stand Toilet Transfer: Minimal assistance;Ambulation;Rolling walker (2 wheels)   Toileting- Clothing Manipulation and Hygiene: Total assistance Toileting - Clothing Manipulation Details (indicate cue type and reason): min A sit<>stand             Vision Baseline Vision/History: 0 No visual deficits Patient Visual Report: No change from baseline              Pertinent Vitals/Pain Pain Assessment Pain Assessment: 0-10 Faces Pain Scale: Hurts worst Pain Location: lower back and between shoulder blades Pain Descriptors / Indicators: Aching, Sore Pain Intervention(s): Limited activity within patient's tolerance, Monitored during session, Repositioned, Ice applied     Hand Dominance Right   Extremity/Trunk Assessment Upper Extremity Assessment Upper Extremity Assessment: Overall WFL for tasks assessed       Cervical / Trunk Assessment Cervical / Trunk Assessment: Normal   Communication Communication Communication: No difficulties   Cognition Arousal/Alertness: Awake/alert Behavior During Therapy: WFL for tasks assessed/performed Overall Cognitive Status: No family/caregiver present to determine baseline cognitive functioning                                 General Comments: Was alert and oriented x4 but kept asking me if I ever worked these certain places or knew these certain people                Home Living Family/patient expects to be discharged to:: Skilled nursing facility                                 Additional Comments: According to chart family is unable to care for him at his current level at this time.      Prior Functioning/Environment Prior Level of Function : Independent/Modified Independent             Mobility Comments: reports up until about 2 weeks ago he was ambulating  fine ADLs Comments: reports up until about 2 weeks ago he was completing ADLs by himself        OT Problem List: Decreased range of motion;Impaired balance (sitting and/or standing);Pain         OT Goals(Current goals can be found in the care plan section) Acute Rehab OT Goals Patient Stated Goal: wants to get up and move         AM-PAC OT "6 Clicks" Daily Activity     Outcome Measure Help from another person eating meals?: None Help from another person taking care of personal grooming?: A Little Help from another person toileting, which includes using toliet, bedpan, or urinal?: A Lot Help from another person bathing (including washing, rinsing, drying)?: A Little Help from another person to put on and taking off regular upper body clothing?: A Little Help from another person to put on and taking off regular lower body clothing?: A Little 6 Click Score: 18   End of Session Equipment Utilized During Treatment: Gait belt;Rolling walker (2 wheels) Nurse Communication: Patient requests pain meds  Activity Tolerance: Patient tolerated treatment well Patient  left: in bed;with call bell/phone within reach;with bed alarm set  OT Visit Diagnosis: Unsteadiness on feet (R26.81);Other abnormalities of gait and mobility (R26.89);Muscle weakness (generalized) (M62.81);Pain Pain - part of body:  (between shoulder blades and low back)                Time: 1308-6578 OT Time Calculation (min): 37 min Charges:  OT General Charges $OT Visit: 1 Visit OT Evaluation $OT Eval Moderate Complexity: 1 Mod OT Treatments $Self Care/Home Management : 8-22 mins Lindon Romp OT Acute Rehabilitation Services Office [phone]    Evette Georges 09/07/2022, 5:32 PM

## 2022-09-07 NOTE — Plan of Care (Signed)

## 2022-09-07 NOTE — Progress Notes (Signed)
3W Charge RN Note:   Primary RN Thais notified charge nurse and unit nurse manager Marisa Cyphers that patient had drug paraphernalia in jacket pocket while searching for missing wallet.  Wallet was not found, but two glass pipes found and small white plastic container found.  Security called to bedside to collect paraphernalia items.  Mr. Littleton notified of security disposing of paraphernalia items. Verbalized understanding.

## 2022-09-07 NOTE — Progress Notes (Signed)
Palliative Medicine Progress Note   Patient Name: Edgar Mooney       Date: 09/07/2022 DOB: 10-25-1965  Age: 57 y.o. MRN#: [id number] Attending Physician: Almon Hercules, MD Primary Care Physician: Aviva Kluver Admit Date: 09/05/2022   HPI/Patient Profile: 57 y.o. male  with past medical history of prostate cancer with widespread osseous metastasis, polysubstance abuse, and hep C.  He presented to the ED on 09/05/2022 with shoulder pain along with worsening forehead/scalp nodules. CT head showing large amount of vasogenic edema within the right frontal lobe causing leftward midline shift and early herniation of the cingulate gyrus, concern for subdural blood or dural based tumor (favored), and diffuse sclerotic metastatic disease of the calvarium. Neurosurgery has recommended Decadron and seizure prophylaxis, and reported no need for surgical intervention.   Palliative Medicine has been consulted in the setting of widely metastatic prostate cancer and new concern for brain metastasis.   In June 2023, patient was transitioned to comfort care and transferred to inpatient hospice. This was later reversed and patient was able to resume cancer treatment, with targeted therapy and palliative radiation.    Subjective: Chart reviewed and patient assessed at bedside.  He is currently reporting 8/10 pain in his back and shoulders. He remains confused.   I spoke with daughter and son (on speaker phone). They report they have spoken with Dr. Truett Perna and understand that overall prognosis is poor and lifespan may be 2 weeks to a few months.  Discussed that Dr. Truett Perna had reached out to Dr. Roselind Messier with radiation oncology; family understands patient is a poor candidate for palliative radiation.   Daughter confirms  that family agrees with DNR/DNI status and that they wish to focus on pain management and quality of life.  Daughter is agreeable to hospice referral, and would want patient transferred to [facility] if eligibility is confirmed.   Objective:  Physical Exam Vitals reviewed.  Constitutional:      General: He is awake. He is not in acute distress.    Appearance: He is ill-appearing.  HENT:     Head:     Comments: Frontal scalp nodule Pulmonary:     Effort: Pulmonary effort is normal.  Neurological:     Mental Status: He is confused.  Vital Signs: BP (!) 143/77 (BP Location: Left Arm)   Pulse 67   Temp 98.4 F (36.9 C) (Oral)   Resp 18   SpO2 99%  SpO2: SpO2: 99 % O2 Device: O2 Device: Room Air O2 Flow Rate:      Palliative Medicine Assessment & Plan   Assessment: Principal Problem:   Brain mass Active Problems:   Polysubstance abuse (HCC)   Prostate cancer metastatic to bone (HCC)   Tobacco dependence    Recommendations/Plan: DNR/DNI as previously documented Appreciate oncology recommendations Continue Decadron and Keppra for seizure prophylaxis Will start OxyContin 20 mg twice daily - increase as needed/tolerated Nicotine patch 21 mg - per daughter patient smokes at least 1 ppd Referral to [facility] for inpatient hospice - family states preference for [facility] PMT will continue to follow  Prognosis:  < 3 months  Discharge Planning: To Be Determined  Care plan was discussed with Dr. Alanda Slim, Dr. Truett Perna, RN, and [facility]  Thank you for allowing the Palliative Medicine Team to assist in the care of this patient.   Greater than 50%  of this time was spent counseling and coordinating care related to the above assessment and plan.  Total time: 65 minutes   Merry Proud, NP Palliative Medicine   Please contact Palliative Medicine Team phone at [phone] for questions and concerns.  For individual provider, see AMION.

## 2022-09-07 NOTE — Progress Notes (Signed)
PROGRESS NOTE  Edgar Mooney ZOX:[id number] DOB: Jul 09, 1965   PCP: Pcp, No  Patient is from: Home.  DOA: 09/05/2022 LOS: 2  Chief complaints Chief Complaint  Patient presents with   Shoulder Pain     Brief Narrative / Interim history: 57 year old M with PMH of metastatic prostate cancer, polysubstance abuse, hep C, tobacco use disorder, HTN and anemia presenting with bilateral shoulder pain, knee pain and increased forehead mass, and admitted for large vasogenic edema within the right frontal lobe with 10 mm midline shift and early subfalcine herniation of the cingulate gyrus likely from metastatic prostate cancer.  Bilateral shoulder and right knee x-ray also showed widespread osseous metastatic disease.  Of note, patient was seen in ED in 09/2021 and discharged to [facility] before he went home and resume treatment for his prostate cancer and palliative radiation.His PSA, which had dropped from 1070 to 18.5 was up to 149 on 4/29.     In ED, Workup demonstrates widespread osseous metastatic disease and large area of vasogenic edema in the right frontal lobe with midline shift and early herniation of cingulate gyrus. Neurosurgery [facility]) was consulted and recommended Decadron and seizure prophylaxis.  Palliative and oncology following.  Per oncology and radiation oncology, not a candidate for palliative radiation.  Oncology recommended hospice.  Palliative following.  Subjective: Seen and examined earlier this morning.  No major events overnight of this morning.  He reports some pain in his back.  Had discussion with oncology this morning.  Neurologist will obtain wound.  No treatment options.  Per oncology, not given an option for palliative radiation.  Oncology recommended hospice.  We also discussed about CODE STATUS including pros and cons of CPR.  He wants to be DNR/DNI.  He is awake and oriented x 4 with good insight into his situation.  Objective: Vitals:   09/06/22 1944 09/07/22  0407 09/07/22 0903 09/07/22 1113  BP: 111/89 (!) 145/69 (!) 143/77 125/87  Pulse: 71 69 67 69  Resp: 18 18 18 18   Temp: 97.6 F (36.4 C) 98 F (36.7 C) 98.4 F (36.9 C) 98.6 F (37 C)  TempSrc: Oral Oral Oral Oral  SpO2: 95% 99% 99% 96%    Examination:  GENERAL: Appears frail.  No apparent distress. HEENT: MMM.  Vision and hearing grossly intact.  Quarter sized mass over left frontal scalp. NECK: Supple.  No apparent JVD.  RESP:  No IWOB.  Fair aeration bilaterally. CVS:  RRR. Heart sounds normal.  ABD/GI/GU: BS+. Abd firm and slightly tender.  No rebound. MSK/EXT: Significant muscle mass and subcu fat loss. SKIN: no apparent skin lesion or wound NEURO: Awake, alert and oriented appropriately.  No apparent focal neuro deficit. PSYCH: Calm. Normal affect.   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 Truett Perna.  Previously admitted to residential hospice in 6/23 but went home and had treatment and palliative radiation.  CT showed a large amount of vasogenic edema with early subfalcine herniation of the cingulate gyrus with concern for dural based tumor as well as diffuse metastatic disease of the calvarium.  Bilateral shoulder and right knee x-ray with evidence of osseous metastasis.  He appears frail.  Per oncology, no treatment option.  Not  even palliative radiation to skull mass. -Neurosurgery, Dr. Danielle Dess recommended Decadron and seizure prophylaxis -Oncology recommended hospice. -CODE STATUS changed to DNR/DNI. -Palliative medicine following  Anemia of chronic disease: H&H slightly trended down.  No obvious bleeding.  Anemia panel consistent with anemia of chronic disease Recent Labs     04/18/22 1321 05/03/22 1302 05/24/22 0915 06/06/22 1445 06/21/22 0754 07/11/22 1027 08/13/22 1257 09/05/22 0346 09/06/22 0441 09/07/22 0914  HGB 8.2* 7.3* 10.0* 9.1* 9.0* 9.2* 8.7* 7.9* 7.1* 7.5*  -Continue monitoring  Abdominal tenderness: Somewhat firm and tender on exam.  LBM 2 days ago.  KUB with nonobstructive gas pattern and diffuse osseous metastatic disease. -Continue bowel regimen   H/o polysubstance abuse: Routinely positive UDS for cocaine during all previous checks -Need to monitor for withdrawal   Tobacco dependence -Encourage cessation.   -Patch ordered    Thrombocytopenia: Likely from malignancy.  Goal of care counseling: Had extensive discussion about CODE STATUS including pros and cons of CPR and intubation in light of his comorbidity.  At this time, he wanted to be DNR/DNI.  He is oriented x 4 with good insight into the situation. -CODE STATUS changed to DNR/DNI.  RN notified.  There is no height or weight on file to calculate BMI.          DVT prophylaxis:  SCDs Start: 09/05/22 0911  Code Status: Full code Family Communication: None at bedside Level of care: Med-Surg Status is: Inpatient Remains inpatient appropriate because: Metastatic prostate cancer with vasogenic edema and midline shift   Final disposition: TBD Consultants:  Neurosurgery Oncology Palliative medicine  55 minutes with more than 50% spent in reviewing records, counseling patient/family and coordinating care.   Sch Meds:  Scheduled Meds:  dexamethasone  4 mg Oral Q6H   docusate sodium  100 mg Oral BID   levETIRAcetam  500 mg Oral BID   oxyCODONE  20 mg Oral Q12H   sodium chloride flush  3 mL Intravenous Q12H   Continuous Infusions:   PRN Meds:.acetaminophen **OR** acetaminophen, albuterol, hydrALAZINE, HYDROmorphone (DILAUDID) injection, ondansetron **OR** ondansetron (ZOFRAN) IV, oxyCODONE, [EXPIRED] polyethylene glycol **FOLLOWED BY** polyethylene glycol,  prochlorperazine, [COMPLETED] senna-docusate **FOLLOWED BY** senna-docusate  Antimicrobials: Anti-infectives (From admission, onward)    None        I have personally reviewed the following labs and images: CBC: Recent Labs  Lab 09/05/22 0346 09/06/22 0441 09/07/22 0914  WBC 4.8 4.0 4.7  NEUTROABS 4.1  --   --   HGB 7.9* 7.1* 7.5*  HCT 23.7* 21.0* 22.4*  MCV 90.8 88.6 91.4  PLT 100* 95* 105*   BMP &GFR Recent Labs  Lab 09/05/22 0346 09/06/22 0441 09/07/22 0703 09/07/22 0704  NA 132* 134*  --  136  K 3.5 4.1  --  3.8  CL 102 105  --  108  CO2 19* 20*  --  17*  GLUCOSE 145* 135*  --  129*  BUN 22* 24*  --  38*  CREATININE 0.87 0.80  --  0.97  CALCIUM 8.6* 8.4*  --  8.4*  MG  --   --  2.4  --   PHOS  --   --   --  4.0   CrCl cannot be calculated (Unknown ideal weight.). Liver & Pancreas: Recent Labs  Lab 09/05/22 0346 09/07/22 0704  AST 29  --   ALT 10  --   ALKPHOS 139*  --   BILITOT 1.1  --   PROT 6.6  --  ALBUMIN 3.0* 2.9*   No results for input(s): "LIPASE", "AMYLASE" in the last 168 hours. No results for input(s): "AMMONIA" in the last 168 hours. Diabetic: No results for input(s): "HGBA1C" in the last 72 hours. No results for input(s): "GLUCAP" in the last 168 hours. Cardiac Enzymes: No results for input(s): "CKTOTAL", "CKMB", "CKMBINDEX", "TROPONINI" in the last 168 hours. No results for input(s): "PROBNP" in the last 8760 hours. Coagulation Profile: No results for input(s): "INR", "PROTIME" in the last 168 hours. Thyroid Function Tests: No results for input(s): "TSH", "T4TOTAL", "FREET4", "T3FREE", "THYROIDAB" in the last 72 hours. Lipid Profile: No results for input(s): "CHOL", "HDL", "LDLCALC", "TRIG", "CHOLHDL", "LDLDIRECT" in the last 72 hours. Anemia Panel: Recent Labs    09/07/22 0703  VITAMINB12 229  FOLATE 8.7  FERRITIN 3,332*  TIBC 196*  IRON 83  RETICCTPCT 2.1   Urine analysis:    Component Value Date/Time   COLORURINE  STRAW (A) 09/16/2021 0200   APPEARANCEUR CLEAR 09/16/2021 0200   LABSPEC 1.002 (L) 09/16/2021 0200   PHURINE 6.0 09/16/2021 0200   GLUCOSEU NEGATIVE 09/16/2021 0200   HGBUR NEGATIVE 09/16/2021 0200   BILIRUBINUR NEGATIVE 09/16/2021 0200   KETONESUR NEGATIVE 09/16/2021 0200   PROTEINUR NEGATIVE 09/16/2021 0200   NITRITE NEGATIVE 09/16/2021 0200   LEUKOCYTESUR NEGATIVE 09/16/2021 0200   Sepsis Labs: Invalid input(s): "PROCALCITONIN", "LACTICIDVEN"  Microbiology: No results found for this or any previous visit (from the past 240 hour(s)).  Radiology Studies: No results found.    Tanishka Drolet T. Devonne Lalani [facility]  If 7PM-7AM, please contact night-coverage [email or web address] 09/07/2022, 12:28 PM

## 2022-09-08 DIAGNOSIS — F17200 Nicotine dependence, unspecified, uncomplicated: Secondary | ICD-10-CM | POA: Diagnosis not present

## 2022-09-08 DIAGNOSIS — Z515 Encounter for palliative care: Secondary | ICD-10-CM | POA: Diagnosis not present

## 2022-09-08 DIAGNOSIS — Z66 Do not resuscitate: Secondary | ICD-10-CM | POA: Insufficient documentation

## 2022-09-08 DIAGNOSIS — Z7189 Other specified counseling: Secondary | ICD-10-CM

## 2022-09-08 DIAGNOSIS — G9389 Other specified disorders of brain: Secondary | ICD-10-CM | POA: Diagnosis not present

## 2022-09-08 DIAGNOSIS — F1910 Other psychoactive substance abuse, uncomplicated: Secondary | ICD-10-CM | POA: Diagnosis not present

## 2022-09-08 DIAGNOSIS — C61 Malignant neoplasm of prostate: Secondary | ICD-10-CM | POA: Diagnosis not present

## 2022-09-08 LAB — RAPID URINE DRUG SCREEN, HOSP PERFORMED
Amphetamines: NOT DETECTED
Barbiturates: NOT DETECTED
Benzodiazepines: NOT DETECTED
Cocaine: NOT DETECTED
Opiates: POSITIVE — AB
Tetrahydrocannabinol: NOT DETECTED

## 2022-09-08 MED ORDER — OXYCODONE HCL ER 15 MG PO T12A
30.0000 mg | EXTENDED_RELEASE_TABLET | Freq: Two times a day (BID) | ORAL | Status: DC
  Administered 2022-09-08 – 2022-09-09 (×3): 30 mg via ORAL
  Filled 2022-09-08 (×3): qty 2

## 2022-09-08 NOTE — Progress Notes (Signed)
PROGRESS NOTE  Edgar Mooney ZOX:[id number] DOB: 10-04-1965   PCP: Pcp, No  Patient is from: Home.  DOA: 09/05/2022 LOS: 3  Chief complaints Chief Complaint  Patient presents with   Shoulder Pain     Brief Narrative / Interim history: 57 year old M with PMH of metastatic prostate cancer, polysubstance abuse, hep C, tobacco use disorder, HTN and anemia presenting with bilateral shoulder pain, knee pain and increased forehead mass, and admitted for large vasogenic edema within the right frontal lobe with 10 mm midline shift and early subfalcine herniation of the cingulate gyrus likely from metastatic prostate cancer.  Bilateral shoulder and right knee x-ray also showed widespread osseous metastatic disease.  Of note, patient was seen in ED in 09/2021 and discharged to [facility] before he went home and resume treatment for his prostate cancer and palliative radiation.His PSA, which had dropped from 1070 to 18.5 was up to 149 on 4/29.     In ED, Workup demonstrates widespread osseous metastatic disease and large area of vasogenic edema in the right frontal lobe with midline shift and early herniation of cingulate gyrus. Neurosurgery [facility]) was consulted and recommended Decadron and seizure prophylaxis.    Palliative and oncology following.  Per oncology and radiation oncology, not a candidate for treatment, and palliative radiation won't change the outcome or improve quality.  Oncology recommended hospice.  Palliative following.  Referral to [facility] requested.  Subjective: Seen and examined earlier this morning.  No complaints.  He denies pain.  Resting.  Objective: Vitals:   09/07/22 1941 09/07/22 2306 09/08/22 0823 09/08/22 1136  BP: (!) 154/87 (!) 153/88 (!) 156/87 (!) 153/88  Pulse: 76 71 73 78  Resp: 16 16 18 18   Temp: 98.3 F (36.8 C) 98.6 F (37 C) 97.7 F (36.5 C) 97.7 F (36.5 C)  TempSrc: Oral Oral Oral Oral  SpO2: 97% 96% 100% 100%    Examination:  GENERAL:  Appears frail.  No apparent distress. HEENT: MMM.  Vision and hearing grossly intact.  Quarter sized mass over left frontal scalp. NECK: Supple.  No apparent JVD.  RESP:  No IWOB.  Fair aeration bilaterally. CVS:  RRR. Heart sounds normal.  ABD/GI/GU: BS+. Abd firm and slightly tender.  No rebound. MSK/EXT: Significant muscle mass and subcu fat loss. SKIN: no apparent skin lesion or wound NEURO: Awake, alert and oriented appropriately.  No apparent focal neuro deficit. PSYCH: Calm. Normal affect.   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 Truett Perna.  Previously admitted to residential hospice in 6/23 but went home and had treatment and palliative radiation.  CT showed a large amount of vasogenic edema with early subfalcine herniation of the cingulate gyrus with concern for dural based tumor as well as diffuse metastatic disease of the calvarium.  Bilateral shoulder and right knee x-ray with evidence of osseous metastasis.  He appears frail.  Per oncology, no treatment option.  Not even palliative radiation to skull mass. -Neurosurgery, Dr. Danielle Dess recommended Decadron and seizure prophylaxis -Oncology recommended hospice. -CODE STATUS changed to DNR/DNI. -Palliative medicine following -Referral to [facility] requested.  Anemia of chronic disease: H&H slightly trended down.  No obvious bleeding.  Anemia panel consistent with anemia of chronic  disease Recent Labs    04/18/22 1321 05/03/22 1302 05/24/22 0915 06/06/22 1445 06/21/22 0754 07/11/22 1027 08/13/22 1257 09/05/22 0346 09/06/22 0441 09/07/22 0914  HGB 8.2* 7.3* 10.0* 9.1* 9.0* 9.2* 8.7* 7.9* 7.1* 7.5*  -Continue monitoring  Abdominal  tenderness: Somewhat firm and tender on exam.  LBM 2 days ago.  KUB with nonobstructive gas pattern and diffuse osseous metastatic disease. -Continue bowel regimen   H/o polysubstance abuse: Routinely positive UDS for cocaine during all previous checks -Need to monitor for withdrawal   Tobacco dependence -Encourage cessation.   -Patch ordered    Thrombocytopenia: Likely from malignancy.  Goal of care counseling: DNR/DNI.  Plan for transfer to hospice.  -Palliative medicine following  There is no height or weight on file to calculate BMI.          DVT prophylaxis:  SCDs Start: 09/05/22 0911  Code Status: Full code Family Communication: None at bedside Level of care: Med-Surg Status is: Inpatient Remains inpatient appropriate because: Metastatic prostate cancer with vasogenic edema and midline shift   Final disposition: TBD Consultants:  Neurosurgery Oncology Palliative medicine  25 minutes with more than 50% spent in reviewing records, counseling patient/family and coordinating care.   Sch Meds:  Scheduled Meds:  dexamethasone  4 mg Oral Q6H   docusate sodium  100 mg Oral BID   levETIRAcetam  500 mg Oral BID   nicotine  21 mg Transdermal Daily   oxyCODONE  20 mg Oral Q12H   sodium chloride flush  3 mL Intravenous Q12H   Continuous Infusions:   PRN Meds:.acetaminophen **OR** acetaminophen, albuterol, hydrALAZINE, HYDROmorphone (DILAUDID) injection, ondansetron **OR** ondansetron (ZOFRAN) IV, oxyCODONE, [EXPIRED] polyethylene glycol **FOLLOWED BY** polyethylene glycol, prochlorperazine, [COMPLETED] senna-docusate **FOLLOWED BY** senna-docusate  Antimicrobials: Anti-infectives (From admission, onward)    None        I have personally reviewed the following labs and images: CBC: Recent Labs  Lab 09/05/22 0346 09/06/22 0441 09/07/22 0914  WBC 4.8 4.0 4.7  NEUTROABS 4.1  --   --   HGB 7.9* 7.1* 7.5*  HCT 23.7* 21.0* 22.4*  MCV 90.8 88.6 91.4  PLT  100* 95* 105*   BMP &GFR Recent Labs  Lab 09/05/22 0346 09/06/22 0441 09/07/22 0703 09/07/22 0704  NA 132* 134*  --  136  K 3.5 4.1  --  3.8  CL 102 105  --  108  CO2 19* 20*  --  17*  GLUCOSE 145* 135*  --  129*  BUN 22* 24*  --  38*  CREATININE 0.87 0.80  --  0.97  CALCIUM 8.6* 8.4*  --  8.4*  MG  --   --  2.4  --   PHOS  --   --   --  4.0   CrCl cannot be calculated (Unknown ideal weight.). Liver & Pancreas: Recent Labs  Lab 09/05/22 0346 09/07/22 0704  AST 29  --   ALT 10  --   ALKPHOS 139*  --   BILITOT 1.1  --   PROT 6.6  --   ALBUMIN 3.0* 2.9*   No results for input(s): "LIPASE", "AMYLASE" in the last 168 hours. No results for input(s): "AMMONIA" in the last 168 hours. Diabetic: No results for input(s): "HGBA1C" in the last 72 hours. No results for input(s): "GLUCAP" in the last 168 hours. Cardiac Enzymes: No results for input(s): "CKTOTAL", "CKMB", "CKMBINDEX", "TROPONINI" in the last 168 hours. No results for input(s): "PROBNP" in the last 8760 hours. Coagulation Profile: No  results for input(s): "INR", "PROTIME" in the last 168 hours. Thyroid Function Tests: No results for input(s): "TSH", "T4TOTAL", "FREET4", "T3FREE", "THYROIDAB" in the last 72 hours. Lipid Profile: No results for input(s): "CHOL", "HDL", "LDLCALC", "TRIG", "CHOLHDL", "LDLDIRECT" in the last 72 hours. Anemia Panel: Recent Labs    09/07/22 0703  VITAMINB12 229  FOLATE 8.7  FERRITIN 3,332*  TIBC 196*  IRON 83  RETICCTPCT 2.1   Urine analysis:    Component Value Date/Time   COLORURINE STRAW (A) 09/16/2021 0200   APPEARANCEUR CLEAR 09/16/2021 0200   LABSPEC 1.002 (L) 09/16/2021 0200   PHURINE 6.0 09/16/2021 0200   GLUCOSEU NEGATIVE 09/16/2021 0200   HGBUR NEGATIVE 09/16/2021 0200   BILIRUBINUR NEGATIVE 09/16/2021 0200   KETONESUR NEGATIVE 09/16/2021 0200   PROTEINUR NEGATIVE 09/16/2021 0200   NITRITE NEGATIVE 09/16/2021 0200   LEUKOCYTESUR NEGATIVE 09/16/2021 0200    Sepsis Labs: Invalid input(s): "PROCALCITONIN", "LACTICIDVEN"  Microbiology: No results found for this or any previous visit (from the past 240 hour(s)).  Radiology Studies: No results found.    Radie Berges T. Azura Tufaro [facility]  If 7PM-7AM, please contact night-coverage [email or web address] 09/08/2022, 12:51 PM

## 2022-09-08 NOTE — Progress Notes (Signed)
   Palliative Medicine Inpatient Follow Up Note HPI: 57 y.o. male  with past medical history of prostate cancer with widespread osseous metastasis, polysubstance abuse, and hep C.  He presented to the ED on 09/05/2022 with shoulder pain along with worsening forehead/scalp nodules. CT head showing large amount of vasogenic edema within the right frontal lobe causing leftward midline shift and early herniation of the cingulate gyrus, concern for subdural blood or dural based tumor (favored), and diffuse sclerotic metastatic disease of the calvarium. Neurosurgery has recommended Decadron and seizure prophylaxis, and reported no need for surgical intervention.   Palliative Medicine has been consulted in the setting of widely metastatic prostate cancer and new concern for brain metastasis.   In June 2023, patient was transitioned to comfort care and transferred to inpatient hospice. This was later reversed and patient was able to resume cancer treatment, with targeted therapy and palliative radiation.   Today's Discussion 09/08/2022  *Please note that this is a verbal dictation therefore any spelling or grammatical errors are due to the "[employer]agon [employer] One" system interpretation.  Chart reviewed inclusive of vital signs, progress notes, laboratory results, and diagnostic images.   I met with Edgar Mooney this morning. He shares that he is experiencing some abdominal pain though otherwise denies nausea or shortness of breath. He and I reviewed that he has metastatic prostate cancer to his brain. He is aware that there are no more treatments to be offered and that the measures now are those associated with keeping him comfortable.   Created space and opportunity for patient to explore thoughts feelings and fears regarding current medical situation.Edgar Mooney is agreeable to transition to hospice if a bed becomes available.  I called patients daughter this afternoon though it rang through to VM. I have updated TOC and the  [employer].   Questions and concerns addressed/Palliative Support Provided.   Objective Assessment: Vital Signs Vitals:   09/08/22 0823 09/08/22 1136  BP: (!) 156/87 (!) 153/88  Pulse: 73 78  Resp: 18 18  Temp: 97.7 F (36.5 C) 97.7 F (36.5 C)  SpO2: 100% 100%    Intake/Output Summary (Last 24 hours) at 09/08/2022 1403 Last data filed at 09/08/2022 0253 Gross per 24 hour  Intake --  Output 600 ml  Net -600 ml   Gen:  Middle aged caucasian M   HEENT: moist mucous membranes CV: Regular rate and rhythm  PULM:  On RA breathing is even and nonlabored ABD: tender, slight distention EXT: No edema  Neuro: Alert and oriented x2  SUMMARY OF RECOMMENDATIONS   DNR/DNI   Continue Decadron and Keppra for seizure prophylaxis Increase OxyContin 30 mg twice daily Oxycodone 5-10mg  PO Q4H PRN Dilaudid 0.5-1mg  IVP Q3H PRN Nicotine patch 21 mg - per daughter patient smokes at least 1 ppd Referral to [facility] for inpatient hospice - family states preference for [facility] PMT will continue to follow   Billing based on MDM: High ______________________________________________________________________________________ Edgar Mooney Palliative Medicine Team Team Cell Phone: [phone] Please utilize secure chat with additional questions, if there is no response within 30 minutes please call the above phone number  Palliative Medicine Team providers are available by phone from 7am to 7pm daily and can be reached through the team cell phone.  Should this patient require assistance outside of these hours, please call the patient's attending physician.

## 2022-09-08 NOTE — Progress Notes (Signed)
Patient pulled out his peripheral IV due to pain in his arm.  Dr Alanda Slim notified of loss of IV access.  Text messge to leave IV out and try to manage pain with PO pain medications.

## 2022-09-08 NOTE — Evaluation (Signed)
Physical Therapy Evaluation Patient Details Name: Edgar Mooney MRN: [id number] DOB: Jan 12, 1966 Today's Date: 09/08/2022  History of Present Illness  57 year old M with PMH of metastatic prostate cancer, polysubstance abuse, hep C, tobacco use disorder, HTN and anemia presenting with bilateral shoulder pain, knee pain and increased forehead mass, and admitted for large vasogenic edema within the right frontal lobe with 10 mm midline shift and early subfalcine herniation of the cingulate gyrus likely from metastatic prostate cancer.  Bilateral shoulder and right knee x-ray also showed widespread osseous metastatic disease.   Clinical Impression  Pt admitted with above. Aware pt with poor medical prognosis and considering residential hospice but may need to go to SNF with palliative care. Compared to OT eval yesterday pt with noted confusion, delayed processing, is soft spoken, and required constant verbal and tactile cues to complete tasks. Pt wanted to get up with PT however with poor initiation/motor planning. Pt amb 10' with RW from one side of bed to the other to sit in chair. Pt with noted R ankle pain with onset of weight bearing in addition to back pain and R knee pain. Acute PT to cont to monitor patient until d/c recommendations are solidified.        Recommendations for follow up therapy are one component of a multi-disciplinary discharge planning process, led by the attending physician.  Recommendations may be updated based on patient status, additional functional criteria and insurance authorization.  Follow Up Recommendations       Assistance Recommended at Discharge Frequent or constant Supervision/Assistance  Patient can return home with the following  A lot of help with walking and/or transfers;A lot of help with bathing/dressing/bathroom;Assistance with cooking/housework;Direct supervision/assist for medications management;Help with stairs or ramp for entrance;Assist for  transportation;Direct supervision/assist for financial management    Equipment Recommendations  (TBD at next venue)  Recommendations for Other Services       Functional Status Assessment Patient has had a recent decline in their functional status and/or demonstrates limited ability to make significant improvements in function in a reasonable and predictable amount of time     Precautions / Restrictions Precautions Precautions: Fall Precaution Comments: progressive/terminal disease Restrictions Weight Bearing Restrictions: No      Mobility  Bed Mobility Overal bed mobility: Needs Assistance Bed Mobility: Rolling, Sidelying to Sit, Sit to Sidelying Rolling: Min assist Sidelying to sit: HOB elevated, Mod assist       General bed mobility comments: tactile cues to initiate, minA for rolling, modA for trunk elevation to EOB and then to scoot to EOB , HOB elevated to 30 deg    Transfers Overall transfer level: Needs assistance Equipment used: Rolling walker (2 wheels) Transfers: Sit to/from Stand Sit to Stand: Mod assist           General transfer comment: max directional verbal cues, modA to power up, verbal cues for hand placement, pt with posterior bias requiring modA for anterior weight shift    Ambulation/Gait Ambulation/Gait assistance: Mod assist Gait Distance (Feet): 10 Feet Assistive device: Rolling walker (2 wheels) Gait Pattern/deviations: Step-to pattern, Decreased stride length, Antalgic       General Gait Details: modA to maintain forward bias and walker management. pt with noted R antalgic limp with report of R ankle pain limiting ambulation tolerance  Stairs            Wheelchair Mobility    Modified Rankin (Stroke Patients Only)       Balance Overall balance assessment: Needs assistance  Sitting-balance support: Feet supported, Bilateral upper extremity supported Sitting balance-Leahy Scale: Fair     Standing balance support:  Bilateral upper extremity supported, Reliant on assistive device for balance Standing balance-Leahy Scale: Poor Standing balance comment: dependent on external support                             Pertinent Vitals/Pain Pain Assessment Pain Assessment: 0-10 Pain Score: 9  Pain Location: lower back and between shoulder blades when up, headache of 9/10 at rest, onset of R ankle pain with WBing Pain Descriptors / Indicators: Aching, Sore Pain Intervention(s): Limited activity within patient's tolerance    Home Living Family/patient expects to be discharged to:: Skilled nursing facility                   Additional Comments: According to chart family looking into [facility] vs SNF with palliative or hospice care    Prior Function Prior Level of Function : Independent/Modified Independent             Mobility Comments: reports up until about 2 weeks ago he was ambulating fine ADLs Comments: reports up until about 2 weeks ago he was completing ADLs by himself     Hand Dominance   Dominant Hand: Right    Extremity/Trunk Assessment   Upper Extremity Assessment Upper Extremity Assessment: Generalized weakness    Lower Extremity Assessment Lower Extremity Assessment: Generalized weakness    Cervical / Trunk Assessment Cervical / Trunk Assessment: Normal  Communication   Communication: Expressive difficulties (delayed response)  Cognition Arousal/Alertness: Lethargic Behavior During Therapy: Flat affect Overall Cognitive Status: No family/caregiver present to determine baseline cognitive functioning                                 General Comments: pt with delayed response time, soft spoken, noted confusion compared to OT eval yesterday. Pt asking "where you all moving to" but also aware he is in [facility]. pt with decreased initiation and impaired motor planning despite desiring to get up OOB to chair requiring constant verbal and tactile cues to  complete tasks        General Comments General comments (skin integrity, edema, etc.): vss, c/o pain t/o body    Exercises     Assessment/Plan    PT Assessment Patient needs continued PT services  PT Problem List Decreased strength;Decreased range of motion;Decreased activity tolerance;Decreased balance;Decreased mobility;Decreased coordination;Decreased cognition;Decreased knowledge of use of DME;Decreased safety awareness;Decreased knowledge of precautions;Pain       PT Treatment Interventions DME instruction;Gait training;Stair training;Functional mobility training;Therapeutic activities;Therapeutic exercise;Balance training;Neuromuscular re-education    PT Goals (Current goals can be found in the Care Plan section)  Acute Rehab PT Goals Patient Stated Goal: didn't state PT Goal Formulation: Patient unable to participate in goal setting Time For Goal Achievement: 09/21/22 Potential to Achieve Goals: Poor (due to poor medical prognosis)    Frequency Min 2X/week     Co-evaluation               AM-PAC PT "6 Clicks" Mobility  Outcome Measure Help needed turning from your back to your side while in a flat bed without using bedrails?: A Lot Help needed moving from lying on your back to sitting on the side of a flat bed without using bedrails?: A Lot Help needed moving to and from a bed to a chair (  including a wheelchair)?: A Lot Help needed standing up from a chair using your arms (e.g., wheelchair or bedside chair)?: A Lot Help needed to walk in hospital room?: A Lot Help needed climbing 3-5 steps with a railing? : Total 6 Click Score: 11    End of Session Equipment Utilized During Treatment: Gait belt Activity Tolerance: Patient limited by pain Patient left: in chair;with call bell/phone within reach;with chair alarm set Nurse Communication: Mobility status PT Visit Diagnosis: Unsteadiness on feet (R26.81);Pain Pain - part of body:  (back, knee, ankle)    Time:  1610-9604 PT Time Calculation (min) (ACUTE ONLY): 24 min   Charges:   PT Evaluation $PT Eval Moderate Complexity: 1 Mod PT Treatments $Gait Training: 8-22 mins        Lewis Shock, PT, DPT Acute Rehabilitation Services Secure chat preferred Office #: [phone]   Iona Hansen 09/08/2022, 11:09 AM

## 2022-09-08 NOTE — Progress Notes (Signed)
[employer], [employer] [facility]) Hospital Liaison Note  Received request from PMT NP/Michelle F. for family interest in [facility].  MSW contacted daughter/[name] to confirm interest in hospice services. No answer, VM left requesting call be returned.   At this time, patient is not under review for BP transfer pending family consent.   Please do not hesitate to call with any hospice related questions.    Thank you for the opportunity to participate in this patient's care.  Eugenie Birks, MSW [facility] Liaison  [phone]

## 2022-09-09 DIAGNOSIS — Z515 Encounter for palliative care: Secondary | ICD-10-CM

## 2022-09-09 DIAGNOSIS — G893 Neoplasm related pain (acute) (chronic): Secondary | ICD-10-CM

## 2022-09-09 DIAGNOSIS — G9389 Other specified disorders of brain: Secondary | ICD-10-CM | POA: Diagnosis not present

## 2022-09-09 DIAGNOSIS — C61 Malignant neoplasm of prostate: Secondary | ICD-10-CM | POA: Diagnosis not present

## 2022-09-09 NOTE — Progress Notes (Signed)
[facility] 3W28 [facility]) [facility] Liaison Note  Follow up for request of PMT for family interest in [facility].  Spoke to patient's daughter, [name], who confirms interest in [facility].  Explained hospice philosophy and team approach to care.  Rounded on patient in room with bedside RN, Sian, present.  Patient states his pain level is a 4/10 and states his pain is being well controlled.  He asks bedside RN for ibuprofen during visit, she administered PO pain meds.  Patient states he "wants to go somewhere where they can take care of me" but cannot tel me where.  He states he lives with his significant other but she is unable to care for him as "she lays in the bed all day."  Patient does not meet criteria for [facility] admission at this time.  TOC and MD aware. Can re-evaluate tomorrow or in the future if patient has additional changes.  Doreatha Martin, RN, BSN [facility] Liaison [phone]

## 2022-09-09 NOTE — Progress Notes (Signed)
Pt c/o severe back pain. States "I wish I had a dollar bill to crush that oxy." Upon further assessment he explained that he wanted to shoot up his meds via IV and has a history of the same. RN offered him the option for a SU consult. Pt refused.

## 2022-09-09 NOTE — Progress Notes (Signed)
   Palliative Medicine Inpatient Follow Up Note HPI: 57 y.o. male  with past medical history of prostate cancer with widespread osseous metastasis, polysubstance abuse, and hep C.  He presented to the ED on 09/05/2022 with shoulder pain along with worsening forehead/scalp nodules. CT head showing large amount of vasogenic edema within the right frontal lobe causing leftward midline shift and early herniation of the cingulate gyrus, concern for subdural blood or dural based tumor (favored), and diffuse sclerotic metastatic disease of the calvarium. Neurosurgery has recommended Decadron and seizure prophylaxis, and reported no need for surgical intervention.   Palliative Medicine has been consulted in the setting of widely metastatic prostate cancer and new concern for brain metastasis.   In June 2023, patient was transitioned to comfort care and transferred to inpatient hospice. This was later reversed and patient was able to resume cancer treatment, with targeted therapy and palliative radiation.   Today's Discussion 09/09/2022  *Please note that this is a verbal dictation therefore any spelling or grammatical errors are due to the "Dragon Medical One" system interpretation.  Chart reviewed inclusive of vital signs, progress notes, laboratory results, and diagnostic images.   I met with Danecia Underdown Piper at bedside this morning. He continues to complain about back pain. It does not appear that the pain regiment he is on is working. We reviewed that a new IV will be placed to better optimize his pain control. He denies shortness of breath or nausea this morning.  I spoke with patients RN and updated her on the plan.  I called patients daughter - I have still not heard back regarding him transitioning to full comfort care or [facility] [facility]. I have left a detailed - HIPAA compliant message. TOC and [facility] liaison(s) continue to reach out.   Questions and concerns addressed/Palliative Support Provided.    Objective Assessment: Vital Signs Vitals:   09/08/22 2320 09/09/22 0430  BP: (!) 155/81 (!) 149/91  Pulse: 72 76  Resp: 17 17  Temp: 98.6 F (37 C) 97.6 F (36.4 C)  SpO2: 99% 100%    Intake/Output Summary (Last 24 hours) at 09/09/2022 1610 Last data filed at 09/08/2022 1954 Gross per 24 hour  Intake --  Output 300 ml  Net -300 ml    Gen:  Middle aged caucasian M   HEENT: moist mucous membranes CV: Regular rate and rhythm  PULM:  On RA breathing is even and nonlabored ABD: tender, slight distention EXT: No edema  Neuro: Alert and oriented x2  SUMMARY OF RECOMMENDATIONS   DNR/DNI   Continue Decadron and Keppra for seizure prophylaxis OxyContin 30 mg twice daily Oxycodone 5-10mg  PO Q4H PRN Dilaudid 0.5-1mg  IVP Q3H PRN Nicotine patch 21 mg - per daughter patient smokes at least 1 ppd Referral to [facility] for inpatient hospice - family states preference for [facility] [facility] PMT will continue to follow   Billing based on MDM: High ______________________________________________________________________________________ Lamarr Lulas [place] Palliative Medicine Team Team Cell Phone: [phone] Please utilize secure chat with additional questions, if there is no response within 30 minutes please call the above phone number  Palliative Medicine Team providers are available by phone from 7am to 7pm daily and can be reached through the team cell phone.  Should this patient require assistance outside of these hours, please call the patient's attending physician.

## 2022-09-09 NOTE — Progress Notes (Signed)
PROGRESS NOTE  Edgar Mooney:[id number] DOB: [birth year]   PCP: Pcp, No  Patient is from: Home.  DOA: 09/05/2022 LOS: 4  Chief complaints Chief Complaint  Patient presents with   Shoulder Pain     Brief Narrative / Interim history: 57 year old M with PMH of metastatic prostate cancer, polysubstance abuse, hep C, tobacco use disorder, HTN and anemia presenting with bilateral shoulder pain, knee pain and increased forehead mass, and admitted for large vasogenic edema within the right frontal lobe with 10 mm midline shift and early subfalcine herniation of the cingulate gyrus likely from metastatic prostate cancer.  Bilateral shoulder and right knee x-ray also showed widespread osseous metastatic disease.  Of note, patient was seen in ED in 09/2021 and discharged to [facility] before he went home and resume treatment for his prostate cancer and palliative radiation.His PSA, which had dropped from 1070 to 18.5 was up to 149 on 4/29.     In ED, Workup demonstrates widespread osseous metastatic disease and large area of vasogenic edema in the right frontal lobe with midline shift and early herniation of cingulate gyrus. Neurosurgery [facility]) was consulted and recommended Decadron and seizure prophylaxis.    Palliative and oncology following.  Per oncology and radiation oncology, not a candidate for treatment, and palliative radiation won't change the outcome or improve quality.  Oncology recommended hospice.  Palliative following.  Referral to [facility] requested.  Subjective: Seen and examined earlier this morning.  No complaints.  Sleeping quietly.  Objective: Vitals:   09/08/22 1941 09/08/22 2320 09/09/22 0430 09/09/22 0724  BP: (!) 140/88 (!) 155/81 (!) 149/91 (!) 151/89  Pulse: 73 72 76 78  Resp: 16 17 17 17   Temp: 98.3 F (36.8 C) 98.6 F (37 C) 97.6 F (36.4 C) 98.4 F (36.9 C)  TempSrc: Oral Oral Oral Oral  SpO2: 99% 99% 100% 97%    Examination:  GENERAL: Appears  frail.  No apparent distress. RESP:  No IWOB.  On room air. MSK/EXT:   No apparent deformity.  No edema. SKIN: no apparent skin lesion or wound NEURO: Sleeping.  No apparent focal neurodeficit. PSYCH: Calm. No distress or agitation.  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 Truett Perna.  Previously admitted to residential hospice in 6/23 but went home and had treatment and palliative radiation.  CT showed a large amount of vasogenic edema with early subfalcine herniation of the cingulate gyrus with concern for dural based tumor as well as diffuse metastatic disease of the calvarium.  Bilateral shoulder and right knee x-ray with evidence of osseous metastasis.  He appears frail.  Per oncology, no treatment option.  Not even palliative radiation to skull mass. -Neurosurgery, Dr. Danielle Dess recommended Decadron and seizure prophylaxis -Oncology recommended hospice. -CODE STATUS changed to DNR/DNI. -Palliative medicine following -Plan for transfer to inpatient hospice, [facility] once approved  Cancer-related pain: -On OxyContin, oxycodone and Dilaudid per palliative.  Anemia of chronic disease: H&H slightly trended down.  No obvious bleeding.  Anemia panel consistent with anemia of chronic disease  Abdominal tenderness: Somewhat firm and tender on exam.  LBM 2 days ago.  KUB with nonobstructive  gas pattern and diffuse osseous metastatic disease. -Continue bowel regimen   H/o polysubstance abuse: Routinely positive UDS for cocaine during all previous checks -Need to monitor for withdrawal   Tobacco dependence -Encourage cessation.    -Patch ordered    Thrombocytopenia: Likely from malignancy.  Goal of care counseling: DNR/DNI.  Plan for transfer to hospice.  -Palliative medicine following.  Plan for transfer to [facility] if approved  There is no height or weight on file to calculate BMI.          DVT prophylaxis:  SCDs Start: 09/05/22 0911  Code Status: DNR/DNI Family Communication: None at bedside Level of care: Med-Surg Status is: Inpatient Remains inpatient appropriate because: Metastatic prostate cancer with vasogenic edema and midline shift   Final disposition: Residential hospice Consultants:  Neurosurgery Oncology Palliative medicine  25 minutes with more than 50% spent in reviewing records, counseling patient/family and coordinating care.   Sch Meds:  Scheduled Meds:  dexamethasone  4 mg Oral Q6H   docusate sodium  100 mg Oral BID   levETIRAcetam  500 mg Oral BID   nicotine  21 mg Transdermal Daily   oxyCODONE  30 mg Oral Q12H   sodium chloride flush  3 mL Intravenous Q12H   Continuous Infusions:   PRN Meds:.acetaminophen **OR** acetaminophen, albuterol, hydrALAZINE, HYDROmorphone (DILAUDID) injection, ondansetron **OR** ondansetron (ZOFRAN) IV, oxyCODONE, [EXPIRED] polyethylene glycol **FOLLOWED BY** polyethylene glycol, prochlorperazine, [COMPLETED] senna-docusate **FOLLOWED BY** senna-docusate  Antimicrobials: Anti-infectives (From admission, onward)    None        I have personally reviewed the following labs and images: CBC: Recent Labs  Lab 09/05/22 0346 09/06/22 0441 09/07/22 0914  WBC 4.8 4.0 4.7  NEUTROABS 4.1  --   --   HGB 7.9* 7.1* 7.5*  HCT 23.7* 21.0* 22.4*  MCV 90.8 88.6 91.4  PLT 100* 95* 105*   BMP &GFR Recent Labs  Lab 09/05/22 0346 09/06/22 0441 09/07/22 0703 09/07/22 0704  NA 132* 134*  --  136  K 3.5 4.1  --  3.8  CL 102 105  --  108  CO2 19* 20*  --  17*  GLUCOSE 145* 135*  --  129*  BUN 22* 24*  --  38*  CREATININE 0.87 0.80  --   0.97  CALCIUM 8.6* 8.4*  --  8.4*  MG  --   --  2.4  --   PHOS  --   --   --  4.0   CrCl cannot be calculated (Unknown ideal weight.). Liver & Pancreas: Recent Labs  Lab 09/05/22 0346 09/07/22 0704  AST 29  --   ALT 10  --   ALKPHOS 139*  --   BILITOT 1.1  --   PROT 6.6  --   ALBUMIN 3.0* 2.9*   No results for input(s): "LIPASE", "AMYLASE" in the last 168 hours. No results for input(s): "AMMONIA" in the last 168 hours. Diabetic: No results for input(s): "HGBA1C" in the last 72 hours. No results for input(s): "GLUCAP" in the last 168 hours. Cardiac Enzymes: No results for input(s): "CKTOTAL", "CKMB", "CKMBINDEX", "TROPONINI" in the last 168 hours. No results for input(s): "PROBNP" in the last 8760 hours. Coagulation Profile: No results for input(s): "INR", "PROTIME" in the last 168 hours. Thyroid Function Tests: No results for input(s): "TSH", "T4TOTAL", "FREET4", "T3FREE", "THYROIDAB" in the last 72 hours. Lipid Profile: No results for input(s): "CHOL", "HDL", "LDLCALC", "TRIG", "CHOLHDL", "LDLDIRECT" in the last 72 hours. Anemia Panel: Recent Labs  09/07/22 0703  VITAMINB12 229  FOLATE 8.7  FERRITIN 3,332*  TIBC 196*  IRON 83  RETICCTPCT 2.1   Urine analysis:    Component Value Date/Time   COLORURINE STRAW (A) 09/16/2021 0200   APPEARANCEUR CLEAR 09/16/2021 0200   LABSPEC 1.002 (L) 09/16/2021 0200   PHURINE 6.0 09/16/2021 0200   GLUCOSEU NEGATIVE 09/16/2021 0200   HGBUR NEGATIVE 09/16/2021 0200   BILIRUBINUR NEGATIVE 09/16/2021 0200   KETONESUR NEGATIVE 09/16/2021 0200   PROTEINUR NEGATIVE 09/16/2021 0200   NITRITE NEGATIVE 09/16/2021 0200   LEUKOCYTESUR NEGATIVE 09/16/2021 0200   Sepsis Labs: Invalid input(s): "PROCALCITONIN", "LACTICIDVEN"  Microbiology: No results found for this or any previous visit (from the past 240 hour(s)).  Radiology Studies: No results found.    Deloss Amico T. Cerra [facility]  If 7PM-7AM, please contact  night-coverage [email or web address] 09/09/2022, 11:52 AM

## 2022-09-10 DIAGNOSIS — F17200 Nicotine dependence, unspecified, uncomplicated: Secondary | ICD-10-CM | POA: Diagnosis not present

## 2022-09-10 DIAGNOSIS — F1910 Other psychoactive substance abuse, uncomplicated: Secondary | ICD-10-CM | POA: Diagnosis not present

## 2022-09-10 DIAGNOSIS — Z515 Encounter for palliative care: Secondary | ICD-10-CM | POA: Diagnosis not present

## 2022-09-10 DIAGNOSIS — G9389 Other specified disorders of brain: Secondary | ICD-10-CM | POA: Diagnosis not present

## 2022-09-10 DIAGNOSIS — C61 Malignant neoplasm of prostate: Secondary | ICD-10-CM | POA: Diagnosis not present

## 2022-09-10 DIAGNOSIS — Z66 Do not resuscitate: Secondary | ICD-10-CM | POA: Diagnosis not present

## 2022-09-10 DIAGNOSIS — Z7189 Other specified counseling: Secondary | ICD-10-CM | POA: Diagnosis not present

## 2022-09-10 MED ORDER — OXYCODONE HCL ER 15 MG PO T12A
40.0000 mg | EXTENDED_RELEASE_TABLET | Freq: Two times a day (BID) | ORAL | Status: DC
  Administered 2022-09-10 – 2022-09-11 (×4): 40 mg via ORAL
  Filled 2022-09-10 (×4): qty 1

## 2022-09-10 MED ORDER — HYDROMORPHONE HCL 1 MG/ML IJ SOLN
0.5000 mg | Freq: Three times a day (TID) | INTRAMUSCULAR | Status: DC
  Administered 2022-09-10 – 2022-09-11 (×4): 0.5 mg via INTRAVENOUS
  Filled 2022-09-10 (×4): qty 0.5

## 2022-09-10 MED ORDER — SODIUM CHLORIDE 0.9 % IV SOLN: 12.5000 mg | Freq: Four times a day (QID) | INTRAVENOUS | Status: DC | PRN

## 2022-09-10 MED ORDER — SODIUM CHLORIDE 0.9 % IV SOLN: INTRAVENOUS | Status: DC | PRN

## 2022-09-10 MED ORDER — SODIUM CHLORIDE 0.9 % IV SOLN
12.5000 mg | Freq: Four times a day (QID) | INTRAVENOUS | Status: DC | PRN
  Administered 2022-09-10: 12.5 mg via INTRAVENOUS
  Filled 2022-09-10: qty 0.5

## 2022-09-10 NOTE — Progress Notes (Signed)
PROGRESS NOTE  Edgar Mooney:[id number] DOB: 06-14-1965   PCP: Pcp, No  Patient is from: Home.  DOA: 09/05/2022 LOS: 4  Chief complaints Chief Complaint  Patient presents with   Shoulder Pain     Brief Narrative / Interim history: 57 year old M with PMH of metastatic prostate cancer, polysubstance abuse, hep C, tobacco use disorder, HTN and anemia presenting with bilateral shoulder pain, knee pain and increased forehead mass, and admitted for large vasogenic edema within the right frontal lobe with 10 mm midline shift and early subfalcine herniation of the cingulate gyrus likely from metastatic prostate cancer.  Bilateral shoulder and right knee x-ray also showed widespread osseous metastatic disease.  Of note, patient was seen in ED in 09/2021 and discharged to [facility] before he went home and resume treatment for his prostate cancer and palliative radiation.His PSA, which had dropped from 1070 to 18.5 was up to 149 on 4/29.     In ED, Workup demonstrates widespread osseous metastatic disease and large area of vasogenic edema in the right frontal lobe with midline shift and early herniation of cingulate gyrus. Neurosurgery [facility]) was consulted and recommended Decadron and seizure prophylaxis.    Palliative and oncology following.  Per oncology and radiation oncology, not a candidate for treatment, and palliative radiation won't change the outcome or improve quality.  Oncology recommended hospice.  Palliative following.  Referral to residential hospice requested.  Subjective: Seen and examined earlier this morning.  Complains of back and chest pain and hiccups.  Objective: Vitals:   09/08/22 1941 09/08/22 2320 09/09/22 0430 09/09/22 0724  BP: (!) 140/88 (!) 155/81 (!) 149/91 (!) 151/89  Pulse: 73 72 76 78  Resp: 16 17 17 17   Temp: 98.3 F (36.8 C) 98.6 F (37 C) 97.6 F (36.4 C) 98.4 F (36.9 C)  TempSrc: Oral Oral Oral Oral  SpO2: 99% 99% 100% 97%     Examination:  GENERAL: Appears frail.  No apparent distress.  Having hiccups. RESP:  No IWOB.  On room air. MSK/EXT:   No apparent deformity.  No edema. SKIN: no apparent skin lesion or wound NEURO: Awake and alert.  No apparent focal neurodeficit. PSYCH: Calm. No distress or agitation.  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 Truett Perna.  Previously admitted to residential hospice in 6/23 but went home and had treatment and palliative radiation.  CT showed a large amount of vasogenic edema with early subfalcine herniation of the cingulate gyrus with concern for dural based tumor as well as diffuse metastatic disease of the calvarium.  Bilateral shoulder and right knee x-ray with evidence of osseous metastasis.  He appears frail.  Per oncology, no treatment option.  Not even palliative radiation to skull mass. -Neurosurgery, Dr. Danielle Dess recommended Decadron and seizure prophylaxis -Oncology recommended hospice. -CODE STATUS changed to DNR/DNI. -Palliative medicine following -Referral to residential hospice placed  Cancer-related pain: -On OxyContin, oxycodone and Dilaudid per palliative.  Anemia of chronic disease: H&H slightly trended down.  No obvious bleeding.  Anemia panel consistent with anemia of chronic disease  Abdominal tenderness: Somewhat firm and tender on exam.  LBM 2 days ago.  KUB with nonobstructive gas pattern and diffuse osseous metastatic disease. -Continue bowel regimen   H/o polysubstance abuse: Routinely positive UDS for cocaine during all previous checks -Need to monitor for withdrawal   Tobacco  dependence -Encourage cessation.   -Patch ordered    Thrombocytopenia: Likely from malignancy.  Hiccups/nausea -Change Compazine to Thorazine  Goal of care counseling: DNR/DNI.  Plan for transfer to hospice.  -Palliative medicine following.  Plan for transfer to [facility] if approved  There is no height or weight on file to calculate BMI.          DVT prophylaxis:  SCDs Start: 09/05/22 0911  Code Status: DNR/DNI Family Communication: None at bedside Level of care: Med-Surg Status is: Inpatient Remains inpatient appropriate because: Metastatic prostate cancer with vasogenic edema and midline shift   Final disposition: Residential hospice Consultants:  Neurosurgery Oncology Palliative medicine  25 minutes with more than 50% spent in reviewing records, counseling patient/family and coordinating care.   Sch Meds:  Scheduled Meds:  dexamethasone  4 mg Oral Q6H   docusate sodium  100 mg Oral BID   levETIRAcetam  500 mg Oral BID   nicotine  21 mg Transdermal Daily   oxyCODONE  30 mg Oral Q12H   sodium chloride flush  3 mL Intravenous Q12H   Continuous Infusions:   PRN Meds:.acetaminophen **OR** acetaminophen, albuterol, hydrALAZINE, HYDROmorphone (DILAUDID) injection, ondansetron **OR** ondansetron (ZOFRAN) IV, oxyCODONE, [EXPIRED] polyethylene glycol **FOLLOWED BY** polyethylene glycol, prochlorperazine, [COMPLETED] senna-docusate **FOLLOWED BY** senna-docusate  Antimicrobials: Anti-infectives (From admission, onward)    None        I have personally reviewed the following labs and images: CBC: Recent Labs  Lab 09/05/22 0346 09/06/22 0441 09/07/22 0914  WBC 4.8 4.0 4.7  NEUTROABS 4.1  --   --   HGB 7.9* 7.1* 7.5*  HCT 23.7* 21.0* 22.4*  MCV 90.8 88.6 91.4  PLT 100* 95* 105*   BMP &GFR Recent Labs  Lab 09/05/22 0346 09/06/22 0441 09/07/22 0703 09/07/22 0704  NA 132* 134*  --  136  K 3.5 4.1  --  3.8  CL 102 105  --  108  CO2 19* 20*  --   17*  GLUCOSE 145* 135*  --  129*  BUN 22* 24*  --  38*  CREATININE 0.87 0.80  --  0.97  CALCIUM 8.6* 8.4*  --  8.4*  MG  --   --  2.4  --   PHOS  --   --   --  4.0   CrCl cannot be calculated (Unknown ideal weight.). Liver & Pancreas: Recent Labs  Lab 09/05/22 0346 09/07/22 0704  AST 29  --   ALT 10  --   ALKPHOS 139*  --   BILITOT 1.1  --   PROT 6.6  --   ALBUMIN 3.0* 2.9*   No results for input(s): "LIPASE", "AMYLASE" in the last 168 hours. No results for input(s): "AMMONIA" in the last 168 hours. Diabetic: No results for input(s): "HGBA1C" in the last 72 hours. No results for input(s): "GLUCAP" in the last 168 hours. Cardiac Enzymes: No results for input(s): "CKTOTAL", "CKMB", "CKMBINDEX", "TROPONINI" in the last 168 hours. No results for input(s): "PROBNP" in the last 8760 hours. Coagulation Profile: No results for input(s): "INR", "PROTIME" in the last 168 hours. Thyroid Function Tests: No results for input(s): "TSH", "T4TOTAL", "FREET4", "T3FREE", "THYROIDAB" in the last 72 hours. Lipid Profile: No results for input(s): "CHOL", "HDL", "LDLCALC", "TRIG", "CHOLHDL", "LDLDIRECT" in the  last 72 hours. Anemia Panel: Recent Labs    09/07/22 0703  VITAMINB12 229  FOLATE 8.7  FERRITIN 3,332*  TIBC 196*  IRON 83  RETICCTPCT 2.1   Urine analysis:    Component Value Date/Time   COLORURINE STRAW (A) 09/16/2021 0200   APPEARANCEUR CLEAR 09/16/2021 0200   LABSPEC 1.002 (L) 09/16/2021 0200   PHURINE 6.0 09/16/2021 0200   GLUCOSEU NEGATIVE 09/16/2021 0200   HGBUR NEGATIVE 09/16/2021 0200   BILIRUBINUR NEGATIVE 09/16/2021 0200   KETONESUR NEGATIVE 09/16/2021 0200   PROTEINUR NEGATIVE 09/16/2021 0200   NITRITE NEGATIVE 09/16/2021 0200   LEUKOCYTESUR NEGATIVE 09/16/2021 0200   Sepsis Labs: Invalid input(s): "PROCALCITONIN", "LACTICIDVEN"  Microbiology: No results found for this or any previous visit (from the past 240 hour(s)).  Radiology Studies: No results  found.    Monay Houlton T. Khaniya Tenaglia [facility]  If 7PM-7AM, please contact night-coverage [email or web address] 09/09/2022, 11:52 AM

## 2022-09-10 NOTE — Progress Notes (Addendum)
   Palliative Medicine Inpatient Follow Up Note HPI: 57 y.o. male  with past medical history of prostate cancer with widespread osseous metastasis, polysubstance abuse, and hep C.  He presented to the ED on 09/05/2022 with shoulder pain along with worsening forehead/scalp nodules. CT head showing large amount of vasogenic edema within the right frontal lobe causing leftward midline shift and early herniation of the cingulate gyrus, concern for subdural blood or dural based tumor (favored), and diffuse sclerotic metastatic disease of the calvarium. Neurosurgery has recommended Decadron and seizure prophylaxis, and reported no need for surgical intervention.   Palliative Medicine has been consulted in the setting of widely metastatic prostate cancer and new concern for brain metastasis.   In June 2023, patient was transitioned to comfort care and transferred to inpatient hospice. This was later reversed and patient was able to resume cancer treatment, with targeted therapy and palliative radiation.   Today's Discussion 09/10/2022  *Please note that this is a verbal dictation therefore any spelling or grammatical errors are due to the "Dragon Medical One" system interpretation.  Chart reviewed inclusive of vital signs, progress notes, laboratory results, and diagnostic images.  Per chart review he has gotten x3 doses of oxycodone 10mg  in the last 24 hours. He has not required an IVP dilaudid in > 24 hours.   I met Edgar Mooney at bedside this morning. He shares that he is experiencing a tremendous amount of pain in his lower back. He otherwise denies nausea or shortness of breath. We discussed Edgar Mooney working with physical and occupation therapies which he is in agreement with though it appears is quite uncertain of why this will be pursued. He at this time is not able to repeat back to me what was said in a cohesive way. It does not appear that Edgar Mooney is in a position at this time whereby he can make decisions for his  medical care.   I called patients daughter Edgar Mooney this morning and left a HIPAA compliant VM.  Questions and concerns addressed/Palliative Support Provided.   Objective Assessment: Vital Signs Vitals:   09/10/22 0445 09/10/22 0712  BP: (!) 168/89 (!) 150/91  Pulse: 76 73  Resp: 17 18  Temp: 98.2 F (36.8 C) 98 F (36.7 C)  SpO2: 99% 99%   No intake or output data in the 24 hours ending 09/10/22 0720  Gen:  Middle aged caucasian M   HEENT: moist mucous membranes CV: Regular rate and rhythm  PULM:  On RA breathing is even and nonlabored ABD: tender, slight distention EXT: No edema  Neuro: Alert and oriented x2  SUMMARY OF RECOMMENDATIONS   DNR/DNI   Continue Decadron and Keppra for seizure prophylaxis Increase OxyContin to 40 mg twice daily Oxycodone 5-10mg  PO Q4H PRN Dilaudid 0.5-1mg  IVP Q3H PRN Added dilaudid 0,5mg  IVP Q8H Nicotine patch 21 mg - per daughter patient smokes at least 1 ppd Referral to [facility] for inpatient hospice - family states preference for [facility] PMT will continue to follow   Billing based on MDM: High ______________________________________________________________________________________ Edgar Mooney [place] Palliative Medicine Team Team Cell Phone: [phone] Please utilize secure chat with additional questions, if there is no response within 30 minutes please call the above phone number  Palliative Medicine Team providers are available by phone from 7am to 7pm daily and can be reached through the team cell phone.  Should this patient require assistance outside of these hours, please call the patient's attending physician.

## 2022-09-10 NOTE — Progress Notes (Signed)
CSW spoke with Marcelino Duster, NP of PMT who wants patient to be reviewed by [facility] to determine if he meets their inpatient critieria.  CSW spoke with Cheri of [facility] to request she review patient - Cheri agreeable and will return call to CSW.  Edwin Dada, MSW, LCSW Transitions of Care  Clinical Social Worker II [phone]

## 2022-09-10 NOTE — Progress Notes (Addendum)
   TC from CM Edwin Dada asking me to review the pt's chart for hospice facility. I have reviewed chart and have reached out to discuss with pt's daughter [name] due to the pt not being felt to make medical decision at this time. I have left VM message for her to return my call.   415pm TC to the pt's daughter to confirm goals and interest in Hospice facility. No answer.   Norm Parcel RN [phone]

## 2022-09-10 NOTE — Progress Notes (Signed)
OT received the new order. (Pt evaluated last week and signed off on)  [name] unfortunately does not need skilled OT services since his functional status will not improve (due to his medical issues) from our services here acutely or at a SNF. He would benefit from staff sitting him up in the chair position of the bed, sitting on the EOB, or getting up to the recliner as he feels like it physically and pain wise. That all being said, he is not a candidate for an OT re-eval and we are not recommending any follow up.  Secure chat with Edgar Avena, NP pallative care and she states she understands the reasoning stated here. RE-eval not completed--we will sign off.  Lindon Romp OT Acute Rehabilitation Services Office [phone]

## 2022-09-11 ENCOUNTER — Other Ambulatory Visit (HOSPITAL_COMMUNITY): Payer: Self-pay

## 2022-09-11 DIAGNOSIS — Z66 Do not resuscitate: Secondary | ICD-10-CM | POA: Diagnosis not present

## 2022-09-11 DIAGNOSIS — G9389 Other specified disorders of brain: Secondary | ICD-10-CM | POA: Diagnosis not present

## 2022-09-11 DIAGNOSIS — Z7189 Other specified counseling: Secondary | ICD-10-CM | POA: Diagnosis not present

## 2022-09-11 DIAGNOSIS — C61 Malignant neoplasm of prostate: Secondary | ICD-10-CM | POA: Diagnosis not present

## 2022-09-11 MED ORDER — ONDANSETRON 4 MG PO TBDP: 4.0000 mg | ORAL_TABLET | Freq: Three times a day (TID) | ORAL | 0 refills | Status: DC | PRN

## 2022-09-11 MED ORDER — BISACODYL 10 MG RE SUPP: 10.0000 mg | RECTAL | 0 refills | Status: DC | PRN

## 2022-09-11 MED ORDER — NYSTATIN 100000 UNIT/ML MT SUSP
5.0000 mL | Freq: Four times a day (QID) | OROMUCOSAL | Status: DC
  Administered 2022-09-11 (×4): 500000 [IU] via ORAL
  Filled 2022-09-11 (×4): qty 5

## 2022-09-11 MED ORDER — GLYCOPYRROLATE 1 MG/5ML PO SOLN: 1.0000 mg | Freq: Four times a day (QID) | ORAL | 0 refills | Status: AC | PRN

## 2022-09-11 MED ORDER — SODIUM CHLORIDE 0.9 % IV SOLN: 12.5000 mg | Freq: Four times a day (QID) | INTRAVENOUS | Status: DC | PRN

## 2022-09-11 MED ORDER — BISACODYL 5 MG PO TBEC: 10.0000 mg | DELAYED_RELEASE_TABLET | Freq: Every day | ORAL | Status: DC | PRN

## 2022-09-11 MED ORDER — OXYCODONE HCL ER 40 MG PO T12A: 40.0000 mg | EXTENDED_RELEASE_TABLET | Freq: Two times a day (BID) | ORAL | 0 refills | Status: DC

## 2022-09-11 MED ORDER — DEXAMETHASONE 4 MG PO TABS
4.0000 mg | ORAL_TABLET | Freq: Two times a day (BID) | ORAL | Status: DC
  Administered 2022-09-11: 4 mg via ORAL
  Filled 2022-09-11: qty 1

## 2022-09-11 MED ORDER — POLYETHYLENE GLYCOL 3350 17 G PO PACK: 17.0000 g | PACK | Freq: Every day | ORAL | Status: DC

## 2022-09-11 MED ORDER — HYDROMORPHONE HCL 1 MG/ML IJ SOLN: 0.5000 mg | INTRAMUSCULAR | 0 refills | Status: DC | PRN

## 2022-09-11 NOTE — Progress Notes (Addendum)
Pt dc with [facility] and home med of oxycodone given in a sealed bag to gail . Pt transported to stretcher and in WDL. Left at 2045

## 2022-09-11 NOTE — Discharge Summary (Addendum)
Physician Discharge Summary  Edgar Mooney ZOX:[id number] DOB: 1965/07/02 DOA: 09/05/2022  PCP: Oneita Hurt, No  Admit date: 09/05/2022 Discharge date: 09/11/2022 Admitted From: Home Disposition: Residential hospice   Discharge Condition: Stable for transfer CODE STATUS: DNR/DNI   Hospital course 57 year old M with PMH of metastatic prostate cancer, polysubstance abuse, hep C, tobacco use disorder, HTN and anemia presenting with bilateral shoulder pain, knee pain and increased forehead mass, and admitted for large vasogenic edema within the right frontal lobe with 10 mm midline shift and early subfalcine herniation of the cingulate gyrus likely from metastatic prostate cancer.  Bilateral shoulder and right knee x-ray also showed widespread osseous metastatic disease.  Of note, patient was seen in ED in 09/2021 and discharged to [facility] before he went home and resume treatment for his prostate cancer and palliative radiation.His PSA, which had dropped from 1070 to 18.5 was up to 149 on 4/29.     In ED, Workup demonstrates widespread osseous metastatic disease and large area of vasogenic edema in the right frontal lobe with midline shift and early herniation of cingulate gyrus. Neurosurgery [facility]) was consulted and recommended Decadron and seizure prophylaxis.     Palliative and oncology following.  Per oncology and radiation oncology, not a candidate for treatment, and palliative radiation won't change the outcome or improve quality.  Oncology recommended hospice.  Palliative consulted.  Accepted by [facility] hospice for end-of-life care.  See individual problem list below for more.   Problems addressed during this hospitalization Principal Problem:   Brain mass Active Problems:   Polysubstance abuse (HCC)   Goals of care, counseling/discussion   Prostate cancer metastatic to bone (HCC)   Tobacco dependence   DNR (do not resuscitate)   Stage IV prostate cancer with advanced mets to  skull and bones with right brain vasogenic edema: Followed by Dr. Truett Perna.  Previously admitted to residential hospice in 6/23 but went home and had treatment and palliative radiation.  CT showed a large amount of vasogenic edema with early subfalcine herniation of the cingulate gyrus with concern for dural based tumor as well as diffuse metastatic disease of the calvarium.  Bilateral shoulder and right knee x-ray with evidence of osseous metastasis.  He appears frail.  Per oncology, no treatment option.  Not even palliative radiation to skull mass.  Oncology recommended hospice.  Palliative consulted. -Accepted by [facility] hospice for end-of-life care   Cancer-related pain: -On OxyContin and Dilaudid   Anemia of chronic disease: H&H slightly trended down.  No obvious bleeding.  Anemia panel consistent with anemia of chronic disease   H/o polysubstance abuse: Routinely positive UDS for cocaine during all previous checks -Need to monitor for withdrawal   Tobacco dependence   Thrombocytopenia: Likely from malignancy.   Hiccups/nausea   Goal of care counseling: DNR/DNI.             Time spent 35 minutes  Vital signs Vitals:   09/10/22 2354 09/11/22 0331 09/11/22 0806 09/11/22 1116  BP: (!) 142/90 (!) 149/102 (!) 154/94 (!) (P) 155/87  Pulse: 85 97 86 (P) 80  Temp: 98.3 F (36.8 C) 97.7 F (36.5 C) 98.8 F (37.1 C) (P) 98.5 F (36.9 C)  Resp: 17 15 18  (P) 20  Height:      Weight:      SpO2: 98% 100% 99% (P) 99%  TempSrc: Oral Oral Oral (P) Oral  BMI (Calculated):         Discharge exam GENERAL: Appears frail.  No  apparent distress.  RESP:  No IWOB.  On room air. MSK/EXT:   No apparent deformity.  No edema. SKIN: no apparent skin lesion or wound NEURO: Sleeping.  No apparent focal neurodeficit. PSYCH: Calm. No distress or agitation.  Discharge Instructions Discharge Instructions     Diet - low sodium heart healthy   Complete by: As directed       Allergies as  of 09/11/2022   No Known Allergies      Medication List     STOP taking these medications    oxyCODONE 5 MG immediate release tablet Commonly known as: Oxy IR/ROXICODONE Replaced by: oxyCODONE 40 mg 12 hr tablet       TAKE these medications    bisacodyl 10 MG suppository Commonly known as: Dulcolax Place 1 suppository (10 mg total) rectally as needed for moderate constipation or mild constipation.   chlorproMAZINE 12.5 mg in sodium chloride 0.9 % 25 mL Inject 12.5 mg into the vein every 6 (six) hours as needed.   Glycopyrrolate 1 MG/5ML Soln Take 5 mLs (1 mg total) by mouth 4 (four) times daily as needed for up to 3 days.   HYDROmorphone 1 MG/ML injection Commonly known as: DILAUDID Inject 0.5-1 mLs (0.5-1 mg total) into the vein every 2 (two) hours as needed for severe pain.   ondansetron 4 MG disintegrating tablet Commonly known as: ZOFRAN-ODT Take 1 tablet (4 mg total) by mouth every 8 (eight) hours as needed for nausea or vomiting.   oxyCODONE 40 mg 12 hr tablet Commonly known as: OXYCONTIN Take 1 tablet (40 mg total) by mouth every 12 (twelve) hours. Replaces: oxyCODONE 5 MG immediate release tablet        Consultations: Neurosurgery Oncology Palliative medicine  Procedures/Studies:   DG Abd Portable 1V  Result Date: 09/06/2022 CLINICAL DATA:  Abdominal distension. EXAM: PORTABLE ABDOMEN - 1 VIEW COMPARISON:  PET-CT 01/11/2022.  Abdominal CT 12/10/2020. FINDINGS: 0950 hours. The bowel gas pattern is nonobstructive. There is gas throughout the small and large bowel, but no significant bowel distension or wall thickening identified. No supine evidence of free intraperitoneal air. Apparent interval cholecystectomy clips. Widespread blastic metastatic disease is noted throughout the bones. IMPRESSION: No evidence of bowel obstruction or other acute process. Diffuse osseous metastatic disease. Electronically Signed   By: Carey Bullocks M.D.   On: 09/06/2022  11:14   CT HEAD WO CONTRAST ( )  Result Date: 09/05/2022 CLINICAL DATA:  Metastatic disease evaluation EXAM: CT HEAD WITHOUT CONTRAST TECHNIQUE: Contiguous axial images were obtained from the base of the skull through the vertex without intravenous contrast. RADIATION DOSE REDUCTION: This exam was performed according to the departmental dose-optimization program which includes automated exposure control, adjustment of the mA and/or kV according to patient size and/or use of iterative reconstruction technique. COMPARISON:  None Available. FINDINGS: Brain: There is a large amount of vasogenic edema within the right frontal lobe effacing the right lateral ventricle and causing leftward midline shift of 10 mm. There are lobulated areas of hyperdensity along the right convexity that could represent subdural blood or dural based tumor. No hydrocephalus. Vascular: No hyperdense vessel or unexpected calcification. Skull: Diffuse sclerotic metastatic disease of the calvarium. There is marked extracranial soft tissue thickening of the high right parietal calvarium. Sinuses/Orbits: No acute finding. Other: None. IMPRESSION: 1. Large amount of vasogenic edema within the right frontal lobe effacing the right lateral ventricle and causing leftward midline shift of 10 mm and early subfalcine herniation of the cingulate gyrus. 2.  Lobulated areas of hyperdensity along the right convexity that could represent subdural blood or dural based tumor (favored). 3. Diffuse sclerotic metastatic disease of the calvarium with marked extracranial soft tissue thickening of the high right parietal calvarium. Critical Value/emergent results were called by telephone at the time of interpretation on 09/05/2022 at 3:06 am to provider [facility] , who verbally acknowledged these results. Electronically Signed   By: Deatra Robinson M.D.   On: 09/05/2022 03:06   DG Knee Complete 4 Views Right  Result Date: 09/05/2022 CLINICAL DATA:   Right knee pain, metastatic prostate cancer EXAM: RIGHT KNEE - COMPLETE 4+ VIEW COMPARISON:  None Available. FINDINGS: Patchy sclerosis is seen throughout the distal femur and proximal tibia and fibula most in keeping with changes of osteoblastic metastatic disease. Normal alignment. No pathologic fracture. Mild medial compartment degenerative arthritis. No effusion. Soft tissues are unremarkable. IMPRESSION: 1. Osteoblastic metastatic disease. No pathologic fracture. 2. Mild medial compartment degenerative arthritis. Electronically Signed   By: Helyn Numbers M.D.   On: 09/05/2022 03:00   DG Shoulder Right  Result Date: 09/05/2022 CLINICAL DATA:  Right shoulder pain EXAM: RIGHT SHOULDER - 2+ VIEW COMPARISON:  None Available. FINDINGS: The osseous structures of the right shoulder as well as the right thoracic cage are are diffusely sclerotic in keeping with widespread osseous metastatic disease in this patient with known underlying metastatic prostate cancer. No superimposed pathologic fracture. Acromioclavicular and glenohumeral joint spaces appear preserved. IMPRESSION: 1. Widespread osseous metastatic disease. No superimposed pathologic fracture. Electronically Signed   By: Helyn Numbers M.D.   On: 09/05/2022 02:58       The results of significant diagnostics from this hospitalization (including imaging, microbiology, ancillary and laboratory) are listed below for reference.     Microbiology: No results found for this or any previous visit (from the past 240 hour(s)).   Labs:  CBC: Recent Labs  Lab 09/05/22 0346 09/06/22 0441 09/07/22 0914  WBC 4.8 4.0 4.7  NEUTROABS 4.1  --   --   HGB 7.9* 7.1* 7.5*  HCT 23.7* 21.0* 22.4*  MCV 90.8 88.6 91.4  PLT 100* 95* 105*   BMP &GFR Recent Labs  Lab 09/05/22 0346 09/06/22 0441 09/07/22 0703 09/07/22 0704  NA 132* 134*  --  136  K 3.5 4.1  --  3.8  CL 102 105  --  108  CO2 19* 20*  --  17*  GLUCOSE 145* 135*  --  129*  BUN 22* 24*   --  38*  CREATININE 0.87 0.80  --  0.97  CALCIUM 8.6* 8.4*  --  8.4*  MG  --   --  2.4  --   PHOS  --   --   --  4.0   Estimated Creatinine Clearance: 65.7 mL/min (by C-G formula based on SCr of 0.97 mg/dL). Liver & Pancreas: Recent Labs  Lab 09/05/22 0346 09/07/22 0704  AST 29  --   ALT 10  --   ALKPHOS 139*  --   BILITOT 1.1  --   PROT 6.6  --   ALBUMIN 3.0* 2.9*   No results for input(s): "LIPASE", "AMYLASE" in the last 168 hours. No results for input(s): "AMMONIA" in the last 168 hours. Diabetic: No results for input(s): "HGBA1C" in the last 72 hours. No results for input(s): "GLUCAP" in the last 168 hours. Cardiac Enzymes: No results for input(s): "CKTOTAL", "CKMB", "CKMBINDEX", "TROPONINI" in the last 168 hours. No results for input(s): "PROBNP" in the last  8760 hours. Coagulation Profile: No results for input(s): "INR", "PROTIME" in the last 168 hours. Thyroid Function Tests: No results for input(s): "TSH", "T4TOTAL", "FREET4", "T3FREE", "THYROIDAB" in the last 72 hours. Lipid Profile: No results for input(s): "CHOL", "HDL", "LDLCALC", "TRIG", "CHOLHDL", "LDLDIRECT" in the last 72 hours. Anemia Panel: No results for input(s): "VITAMINB12", "FOLATE", "FERRITIN", "TIBC", "IRON", "RETICCTPCT" in the last 72 hours. Urine analysis:    Component Value Date/Time   COLORURINE STRAW (A) 09/16/2021 0200   APPEARANCEUR CLEAR 09/16/2021 0200   LABSPEC 1.002 (L) 09/16/2021 0200   PHURINE 6.0 09/16/2021 0200   GLUCOSEU NEGATIVE 09/16/2021 0200   HGBUR NEGATIVE 09/16/2021 0200   BILIRUBINUR NEGATIVE 09/16/2021 0200   KETONESUR NEGATIVE 09/16/2021 0200   PROTEINUR NEGATIVE 09/16/2021 0200   NITRITE NEGATIVE 09/16/2021 0200   LEUKOCYTESUR NEGATIVE 09/16/2021 0200   Sepsis Labs: Invalid input(s): "PROCALCITONIN", "LACTICIDVEN"   SIGNED:  Almon Hercules, MD  [facility] 09/11/2022, 3:19 PM

## 2022-09-11 NOTE — Progress Notes (Addendum)
Received sealed bag from pharmacy with patient's home oxycodone. No count/inventory present on bag. Oxycodone left in sealed bag from pharmacy. Report called to hospice RN who is aware to be expecting patient's home oxycodone. Report given to nightshift RN, Efraim Kaufmann, who is aware of patient's oxycodone, states she will place in patient's medication bin in pyxis and will handoff to PT[facility] at time of transport.

## 2022-09-11 NOTE — TOC Transition Note (Signed)
Transition of Care [facility]) - CM/SW Discharge Note   Patient Details  Name: Edgar Mooney MRN: [id number] Date of Birth: 18-Jul-1965  Transition of Care [facility]) CM/SW Contact:  Kermit Balo, RN Phone Number: 09/11/2022, 1:51 PM   Clinical Narrative:    [facility] [place] him [facility] and the patients daughter has accepted. Once paperwork signed pt will be able to admit to hospice. Pt will transport via [facility].   Number for report: [phone]   Final next level of care: Hospice Medical Facility Barriers to Discharge: No Barriers Identified   Patient Goals and CMS Choice CMS Medicare.gov Compare Post Acute Care list provided to:: Patient Represenative (must comment) Choice offered to / list presented to : Adult Children  Discharge Placement                         Discharge Plan and Services Additional resources added to the After Visit Summary for                                       Social Determinants of Health (SDOH) Interventions SDOH Screenings   Food Insecurity: Food Insecurity Present (03/15/2020)  Housing: High Risk (03/15/2020)  Transportation Needs: Unmet Transportation Needs (12/29/2021)  Financial Resource Strain: High Risk (03/15/2020)  Social Connections: Moderately Isolated (03/15/2020)  Tobacco Use: High Risk (09/05/2022)     Readmission Risk Interventions     No data to display

## 2022-09-11 NOTE — Progress Notes (Signed)
PROGRESS NOTE  Edgar Mooney RUE:[id number] DOB: 1965/06/17   PCP: Pcp, No  Patient is from: Home.  DOA: 09/05/2022 LOS: 6  Chief complaints Chief Complaint  Patient presents with   Shoulder Pain     Brief Narrative / Interim history: 57 year old M with PMH of metastatic prostate cancer, polysubstance abuse, hep C, tobacco use disorder, HTN and anemia presenting with bilateral shoulder pain, knee pain and increased forehead mass, and admitted for large vasogenic edema within the right frontal lobe with 10 mm midline shift and early subfalcine herniation of the cingulate gyrus likely from metastatic prostate cancer.  Bilateral shoulder and right knee x-ray also showed widespread osseous metastatic disease.  Of note, patient was seen in ED in 09/2021 and discharged to [facility] before he went home and resume treatment for his prostate cancer and palliative radiation.His PSA, which had dropped from 1070 to 18.5 was up to 149 on 4/29.     In ED, Workup demonstrates widespread osseous metastatic disease and large area of vasogenic edema in the right frontal lobe with midline shift and early herniation of cingulate gyrus. Neurosurgery [facility]) was consulted and recommended Decadron and seizure prophylaxis.    Palliative and oncology following.  Per oncology and radiation oncology, not a candidate for treatment, and palliative radiation won't change the outcome or improve quality.  Oncology recommended hospice.  Palliative following.  Referral to residential hospice requested.  Subjective: Seen and examined earlier this morning.  No major events overnight of this morning.  Sleeping.  Objective: Vitals:   09/10/22 1942 09/10/22 2354 09/11/22 0331 09/11/22 0806  BP: (!) 155/82 (!) 142/90 (!) 149/102 (!) 154/94  Pulse: 86 85 97 86  Resp: 16 17 15 18   Temp: 97.8 F (36.6 C) 98.3 F (36.8 C) 97.7 F (36.5 C) 98.8 F (37.1 C)  TempSrc: Oral Oral Oral Oral  SpO2: 100% 98% 100% 99%  Weight:       Height:        Examination:  GENERAL: Appears frail.  No apparent distress.  RESP:  No IWOB.  On room air. MSK/EXT:   No apparent deformity.  No edema. SKIN: no apparent skin lesion or wound NEURO: Sleeping.  No apparent focal neurodeficit. PSYCH: Calm. No distress or agitation.  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 Truett Perna.  Previously admitted to residential hospice in 6/23 but went home and had treatment and palliative radiation.  CT showed a large amount of vasogenic edema with early subfalcine herniation of the cingulate gyrus with concern for dural based tumor as well as diffuse metastatic disease of the calvarium.  Bilateral shoulder and right knee x-ray with evidence of osseous metastasis.  He appears frail.  Per oncology, no treatment option.  Not even palliative radiation to skull mass. -Neurosurgery, Dr. Danielle Dess recommended Decadron and seizure prophylaxis -Oncology recommended hospice. -CODE STATUS changed to DNR/DNI. -Palliative medicine following -Referral to residential hospice placed  Cancer-related pain: -On OxyContin, oxycodone and Dilaudid per palliative.  Anemia of chronic disease: H&H slightly trended down.  No obvious bleeding.  Anemia panel consistent with anemia of chronic disease  Abdominal tenderness: Somewhat firm and  tender on exam.  LBM 2 days ago.  KUB with nonobstructive gas pattern and diffuse osseous metastatic disease. -Continue bowel regimen   H/o polysubstance abuse: Routinely positive UDS for cocaine during all previous checks -Need to monitor for withdrawal   Tobacco  dependence -Encourage cessation.   -Patch ordered    Thrombocytopenia: Likely from malignancy.  Hiccups/nausea -Change Compazine to Thorazine  Goal of care counseling: DNR/DNI.  Plan for transfer to hospice.  -Palliative medicine following.  Plan for transfer to [facility] if approved  Body mass index is 22.98 kg/m.          DVT prophylaxis:  SCDs Start: 09/05/22 0911  Code Status: DNR/DNI Family Communication: None at bedside Level of care: Med-Surg Status is: Inpatient Remains inpatient appropriate because: Metastatic prostate cancer with vasogenic edema and midline shift   Final disposition: Residential hospice Consultants:  Neurosurgery Oncology Palliative medicine  25 minutes with more than 50% spent in reviewing records, counseling patient/family and coordinating care.   Sch Meds:  Scheduled Meds:  dexamethasone  4 mg Oral BID   docusate sodium  100 mg Oral BID    HYDROmorphone (DILAUDID) injection  0.5 mg Intravenous Q8H   levETIRAcetam  500 mg Oral BID   nicotine  21 mg Transdermal Daily   nystatin  5 mL Oral QID   oxyCODONE  40 mg Oral Q12H   sodium chloride flush  3 mL Intravenous Q12H   Continuous Infusions:  sodium chloride Stopped (09/10/22 2025)   chlorproMAZINE (THORAZINE) 12.5 mg in sodium chloride 0.9 % 25 mL IVPB Stopped (09/10/22 1359)    PRN Meds:.sodium chloride, acetaminophen **OR** acetaminophen, albuterol, chlorproMAZINE (THORAZINE) 12.5 mg in sodium chloride 0.9 % 25 mL IVPB, hydrALAZINE, HYDROmorphone (DILAUDID) injection, ondansetron **OR** ondansetron (ZOFRAN) IV, oxyCODONE, [EXPIRED] polyethylene glycol **FOLLOWED BY** polyethylene glycol, [COMPLETED] senna-docusate **FOLLOWED BY** senna-docusate  Antimicrobials: Anti-infectives (From admission, onward)    None        I have personally reviewed the following labs and images: CBC: Recent Labs  Lab 09/05/22 0346 09/06/22 0441 09/07/22 0914  WBC 4.8 4.0 4.7   NEUTROABS 4.1  --   --   HGB 7.9* 7.1* 7.5*  HCT 23.7* 21.0* 22.4*  MCV 90.8 88.6 91.4  PLT 100* 95* 105*   BMP &GFR Recent Labs  Lab 09/05/22 0346 09/06/22 0441 09/07/22 0703 09/07/22 0704  NA 132* 134*  --  136  K 3.5 4.1  --  3.8  CL 102 105  --  108  CO2 19* 20*  --  17*  GLUCOSE 145* 135*  --  129*  BUN 22* 24*  --  38*  CREATININE 0.87 0.80  --  0.97  CALCIUM 8.6* 8.4*  --  8.4*  MG  --   --  2.4  --   PHOS  --   --   --  4.0   Estimated Creatinine Clearance: 65.7 mL/min (by C-G formula based on SCr of 0.97 mg/dL). Liver & Pancreas: Recent Labs  Lab 09/05/22 0346 09/07/22 0704  AST 29  --   ALT 10  --   ALKPHOS 139*  --   BILITOT 1.1  --   PROT 6.6  --   ALBUMIN 3.0* 2.9*   No results for input(s): "LIPASE", "AMYLASE" in the last 168 hours. No results for input(s): "AMMONIA" in the last 168 hours. Diabetic: No results for input(s): "HGBA1C" in the last 72 hours. No results for input(s): "GLUCAP" in the last 168 hours. Cardiac Enzymes:  No results for input(s): "CKTOTAL", "CKMB", "CKMBINDEX", "TROPONINI" in the last 168 hours. No results for input(s): "PROBNP" in the last 8760 hours. Coagulation Profile: No results for input(s): "INR", "PROTIME" in the last 168 hours. Thyroid Function Tests: No results for input(s): "TSH", "T4TOTAL", "FREET4", "T3FREE", "THYROIDAB" in the last 72 hours. Lipid Profile: No results for input(s): "CHOL", "HDL", "LDLCALC", "TRIG", "CHOLHDL", "LDLDIRECT" in the last 72 hours. Anemia Panel: No results for input(s): "VITAMINB12", "FOLATE", "FERRITIN", "TIBC", "IRON", "RETICCTPCT" in the last 72 hours.  Urine analysis:    Component Value Date/Time   COLORURINE STRAW (A) 09/16/2021 0200   APPEARANCEUR CLEAR 09/16/2021 0200   LABSPEC 1.002 (L) 09/16/2021 0200   PHURINE 6.0 09/16/2021 0200   GLUCOSEU NEGATIVE 09/16/2021 0200   HGBUR NEGATIVE 09/16/2021 0200   BILIRUBINUR NEGATIVE 09/16/2021 0200   KETONESUR NEGATIVE  09/16/2021 0200   PROTEINUR NEGATIVE 09/16/2021 0200   NITRITE NEGATIVE 09/16/2021 0200   LEUKOCYTESUR NEGATIVE 09/16/2021 0200   Sepsis Labs: Invalid input(s): "PROCALCITONIN", "LACTICIDVEN"  Microbiology: No results found for this or any previous visit (from the past 240 hour(s)).  Radiology Studies: No results found.    Najmah Carradine T. Sueellen Kayes [facility]  If 7PM-7AM, please contact night-coverage [email or web address] 09/11/2022, 9:10 AM

## 2022-09-11 NOTE — Progress Notes (Signed)
IP PROGRESS NOTE  Subjective:   Edgar Mooney appears unchanged.  He continues to have pain at the legs and abdomen.  He reports constipation. Objective: Vital signs in last 24 hours: Blood pressure (!) 149/102, pulse 97, temperature 97.7 F (36.5 C), temperature source Oral, resp. rate 15, height 5\' 2"  (1.575 m), weight 125 lb 10.6 oz (57 kg), SpO2 100 %.  Intake/Output from previous day: 05/27 0701 - 05/28 0700 In: 60.5 [I.V.:35.5; IV Piggyback:25] Out: -   Physical Exam:  HEENT thrush at the tongue and buccal mucosa, multiple skull-based masses over the scalp including a large mass at the parietal scalp Abdomen: Mildly distended with tenderness in the lower abdomen Extremities: No leg edema Neurologic: Alert, follows commands, moves all extremities to command, proximal bilateral leg weakness?    Lab Results: No results for input(s): "WBC", "HGB", "HCT", "PLT" in the last 72 hours.   BMET No results for input(s): "NA", "K", "CL", "CO2", "GLUCOSE", "BUN", "CREATININE", "CALCIUM" in the last 72 hours.   No results found for: "CEA1", "CEA", "ZOX096", "CA125"  Studies/Results: No results found.  Medications: I have reviewed the patient's current medications.  Assessment/Plan:  Metastatic prostate cancer - Lytic bone lesions with retroperitoneal adenopathy concerning for metastatic prostate cancer -03/03/2020-CTA chest/abdomen/pelvis widespread osseous metastatic disease and lower retroperitoneal adenopathy, pathologic right anterior third rib fracture, probable spinal canal tumor at the level of S1, -MRI cervical, thoracic, and lumbar spine 03/03/2020-diffuse osseous metastatic disease, ventral epidural tumor impinging on right S1 nerve root, extraosseous tumor at the bilateral ilium, asymmetric enhancing material at the right C5-6 canal-right  -03/03/2020-PSA 763 -Degarelix 03/04/2020 -Abiraterone/prednisone 03/14/2020 -Every 84-month Lupron 04/01/2020 -04/01/2020 PSA  36.5 -05/31/2020 PSA 1.2 -06/28/2020 PSA 1.0 -12/14/2020 PSA 6.4 -01/13/2021 PSA 10.6 -04/26/2021 PSA  45.2 -05/16/2021 PSA 70 -Abiraterone/prednisone discontinued 05/16/2021 -05/16/2021 left hip x-ray-multifocal sclerotic metastasis throughout the pelvis, confluent involving the left acetabulum.  No acute or pathologic fracture.  Left hip osteoarthritis. -Cycle 1 docetaxel 05/31/2021 -05/31/2021 PSA 113 -Palliative radiation to the left acetabulum 06/08/2021 - 06/20/2021 -Cycle 2 docetaxel 06/21/2021 -06/21/2021 PSA improved at 68 -Cycle 3 docetaxel 07/12/2021 -07/12/2021 PSA improved at 52 -Cycle 4 docetaxel 08/02/2021 -Cycle 5 docetaxel 08/22/2021 -CT chest 09/16/2021-negative for pulmonary embolism, right liver mass, extensive bone metastases -Cycle 6 docetaxel 09/28/2021 -PSA increased 09/28/2021 -Olaparib 10/24/2021, patient decreased dose to 1 tablet twice daily on 10/31/2021 due to muscle spasms; dose resumed at prescribed amount 11/06/2021 -PSA increased 11/24/2021 -PSA further increased 12/08/2021 -Olaparib discontinued -PET PSMA scan 01/11/2022-widespread intensely radiotracer avid sclerotic skeletal metastasis involving the axillary and appendicular skeleton.  Small-volume periaortic retroperitoneal radiotracer avid prostate cancer nodal metastasis.  Nonspecific activity in the prostate gland.  No liver or lung prostate cancer metastasis. -Palliative radiation to the right pelvis and proximal right femur 02/01/2022 -Pluvicto 03/14/2022 -PSA markedly improved at 160 on 04/18/2022 -Pluvicto 04/26/2022 -Pluvicto 06/06/2022 -Pluvicto 07/18/2022 2.  Severe anemia-likely secondary to metastatic prostate cancer involving the bones 3.  Mild thrombocytopenia 4.  Cirrhosis with splenomegaly 5.  History of hepatitis C 6.  History of polysubstance abuse 7.  Depression 8.  Tobacco dependence 9.  Right arm weakness, right facial numbness-potentially related to nerve root compromise from metastatic bone lesions 10.   Pain secondary to #1 11.  Extensive bone metastases-every 68-month Zometa starting 04/01/2020 12.  Gout-acute flare right wrist 04/01/2020 treated with indomethacin, allopurinol resumed 13.  Right hepatic lobe mass with elevated AFP concerning for [facility] MRI abdomen 12/21/2020-hypoenhancing inferior right liver mass, segment 6, occluding  or compressing the adjacent right portal vein branch, intrahepatic cholangiocarcinoma favored with differential including hepatocellular carcinoma, Li-Rads M Y 90 planning study 11-22 Y90 right lobe of liver 06/26/2021 14.  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  Presentation to the [facility] emergency room 09/16/2021 with chest pain-transfer to [facility] on a morphine drip and scheduled Ativan 17.  Admission 09/05/2022 with increased pain, progressive skull mass CT head 09/05/2022-vasogenic edema in the right frontal lobe with midline shift, lobulated areas of dural based tumor the right convexity, diffuse sclerotic metastatic disease of the calvarium, extracranial soft tissue thickening of the right parietal calvarium 18.  Oral candidiasis     Edgar Mooney appears unchanged.  He has metastatic hormone refractory prostate cancer.  He is waiting on placement in a residential hospice facility.  I will add nystatin for oral candidiasis.  I taper the Decadron to twice daily.  He continues narcotic analgesics for pain. Recommendations: Change Decadron to twice daily Narcotic analgesics for pain Residential hospice Bowel regimen    LOS: 6 days   Thornton Papas, MD   09/11/2022, 6:52 AM

## 2022-09-12 DIAGNOSIS — C7951 Secondary malignant neoplasm of bone: Secondary | ICD-10-CM | POA: Diagnosis not present

## 2022-09-12 DIAGNOSIS — D649 Anemia, unspecified: Secondary | ICD-10-CM | POA: Diagnosis not present

## 2022-09-12 DIAGNOSIS — E440 Moderate protein-calorie malnutrition: Secondary | ICD-10-CM | POA: Diagnosis not present

## 2022-09-12 DIAGNOSIS — G9389 Other specified disorders of brain: Secondary | ICD-10-CM | POA: Diagnosis not present

## 2022-09-13 ENCOUNTER — Inpatient Hospital Stay: Payer: BC Managed Care – PPO | Admitting: Oncology

## 2022-09-13 ENCOUNTER — Inpatient Hospital Stay: Payer: BC Managed Care – PPO

## 2022-09-13 DIAGNOSIS — G9389 Other specified disorders of brain: Secondary | ICD-10-CM | POA: Diagnosis not present

## 2022-09-13 DIAGNOSIS — D649 Anemia, unspecified: Secondary | ICD-10-CM | POA: Diagnosis not present

## 2022-09-13 DIAGNOSIS — C7951 Secondary malignant neoplasm of bone: Secondary | ICD-10-CM | POA: Diagnosis not present

## 2022-09-13 DIAGNOSIS — E440 Moderate protein-calorie malnutrition: Secondary | ICD-10-CM | POA: Diagnosis not present

## 2022-09-14 DIAGNOSIS — D649 Anemia, unspecified: Secondary | ICD-10-CM | POA: Diagnosis not present

## 2022-09-14 DIAGNOSIS — E440 Moderate protein-calorie malnutrition: Secondary | ICD-10-CM | POA: Diagnosis not present

## 2022-09-14 DIAGNOSIS — C7951 Secondary malignant neoplasm of bone: Secondary | ICD-10-CM | POA: Diagnosis not present

## 2022-09-14 DIAGNOSIS — G9389 Other specified disorders of brain: Secondary | ICD-10-CM | POA: Diagnosis not present

## 2022-09-15 DIAGNOSIS — D649 Anemia, unspecified: Secondary | ICD-10-CM | POA: Diagnosis not present

## 2022-09-15 DIAGNOSIS — E440 Moderate protein-calorie malnutrition: Secondary | ICD-10-CM | POA: Diagnosis not present

## 2022-09-15 DIAGNOSIS — G9389 Other specified disorders of brain: Secondary | ICD-10-CM | POA: Diagnosis not present

## 2022-09-15 DIAGNOSIS — C7951 Secondary malignant neoplasm of bone: Secondary | ICD-10-CM | POA: Diagnosis not present

## 2022-09-15 DIAGNOSIS — C61 Malignant neoplasm of prostate: Secondary | ICD-10-CM | POA: Diagnosis not present

## 2022-10-10 ENCOUNTER — Other Ambulatory Visit (HOSPITAL_COMMUNITY): Payer: BC Managed Care – PPO

## 2022-10-15 DEATH — deceased

## 2022-11-19 ENCOUNTER — Encounter: Payer: Self-pay | Admitting: Nurse Practitioner

## 2022-11-19 ENCOUNTER — Other Ambulatory Visit (HOSPITAL_BASED_OUTPATIENT_CLINIC_OR_DEPARTMENT_OTHER): Payer: Self-pay

## 2022-11-29 ENCOUNTER — Other Ambulatory Visit (HOSPITAL_COMMUNITY): Payer: Self-pay

## 2022-12-07 ENCOUNTER — Other Ambulatory Visit (HOSPITAL_BASED_OUTPATIENT_CLINIC_OR_DEPARTMENT_OTHER): Payer: Self-pay

## 2023-01-31 ENCOUNTER — Encounter: Payer: Self-pay | Admitting: Nurse Practitioner

## 2023-06-05 ENCOUNTER — Encounter: Payer: Self-pay | Admitting: Genetic Counselor
